# Patient Record
Sex: Male | Born: 1955 | Race: White | Hispanic: No | Marital: Married | Smoking: Never smoker
Health system: Southern US, Community
[De-identification: ages and names within clinical notes are randomized; demographics above are authoritative.]

## PROBLEM LIST (undated history)

## (undated) DIAGNOSIS — F32A Depression, unspecified: Secondary | ICD-10-CM

## (undated) DIAGNOSIS — D649 Anemia, unspecified: Secondary | ICD-10-CM

## (undated) DIAGNOSIS — F419 Anxiety disorder, unspecified: Secondary | ICD-10-CM

## (undated) DIAGNOSIS — G709 Myoneural disorder, unspecified: Secondary | ICD-10-CM

## (undated) DIAGNOSIS — R251 Tremor, unspecified: Secondary | ICD-10-CM

## (undated) DIAGNOSIS — G20A1 Parkinson's disease without dyskinesia, without mention of fluctuations: Secondary | ICD-10-CM

## (undated) DIAGNOSIS — I10 Essential (primary) hypertension: Secondary | ICD-10-CM

## (undated) DIAGNOSIS — Z860100 Personal history of colon polyps, unspecified: Secondary | ICD-10-CM

## (undated) DIAGNOSIS — R002 Palpitations: Secondary | ICD-10-CM

## (undated) DIAGNOSIS — R058 Other specified cough: Secondary | ICD-10-CM

## (undated) DIAGNOSIS — R42 Dizziness and giddiness: Secondary | ICD-10-CM

## (undated) DIAGNOSIS — G20 Parkinson's disease: Secondary | ICD-10-CM

## (undated) DIAGNOSIS — I739 Peripheral vascular disease, unspecified: Secondary | ICD-10-CM

## (undated) DIAGNOSIS — F329 Major depressive disorder, single episode, unspecified: Secondary | ICD-10-CM

## (undated) DIAGNOSIS — G20B1 Parkinson's disease with dyskinesia, without mention of fluctuations: Secondary | ICD-10-CM

## (undated) DIAGNOSIS — K6389 Other specified diseases of intestine: Secondary | ICD-10-CM

## (undated) DIAGNOSIS — I2510 Atherosclerotic heart disease of native coronary artery without angina pectoris: Secondary | ICD-10-CM

## (undated) DIAGNOSIS — I4891 Unspecified atrial fibrillation: Secondary | ICD-10-CM

## (undated) DIAGNOSIS — R945 Abnormal results of liver function studies: Secondary | ICD-10-CM

## (undated) DIAGNOSIS — E785 Hyperlipidemia, unspecified: Secondary | ICD-10-CM

## (undated) DIAGNOSIS — E7800 Pure hypercholesterolemia, unspecified: Secondary | ICD-10-CM

## (undated) DIAGNOSIS — R05 Cough: Secondary | ICD-10-CM

## (undated) HISTORY — PX: OTHER SURGICAL HISTORY: SHX169

## (undated) HISTORY — PX: COLONOSCOPY: SHX174

## (undated) HISTORY — PX: TONSILLECTOMY: SUR1361

## (undated) HISTORY — PX: BACK SURGERY: SHX140

## (undated) HISTORY — PX: HERNIA REPAIR: SHX51

---

## 2006-05-19 ENCOUNTER — Ambulatory Visit: Payer: Self-pay | Admitting: Gastroenterology

## 2008-11-27 ENCOUNTER — Emergency Department: Payer: Self-pay | Admitting: Emergency Medicine

## 2009-09-09 ENCOUNTER — Ambulatory Visit: Payer: Self-pay | Admitting: Gastroenterology

## 2013-03-08 ENCOUNTER — Ambulatory Visit: Payer: Self-pay | Admitting: Neurology

## 2013-03-25 DIAGNOSIS — D369 Benign neoplasm, unspecified site: Secondary | ICD-10-CM | POA: Insufficient documentation

## 2013-03-25 DIAGNOSIS — E7800 Pure hypercholesterolemia, unspecified: Secondary | ICD-10-CM | POA: Insufficient documentation

## 2013-03-25 DIAGNOSIS — R945 Abnormal results of liver function studies: Secondary | ICD-10-CM | POA: Insufficient documentation

## 2013-03-25 DIAGNOSIS — J039 Acute tonsillitis, unspecified: Secondary | ICD-10-CM

## 2013-03-25 DIAGNOSIS — E119 Type 2 diabetes mellitus without complications: Secondary | ICD-10-CM | POA: Insufficient documentation

## 2013-03-25 DIAGNOSIS — R002 Palpitations: Secondary | ICD-10-CM | POA: Insufficient documentation

## 2013-03-25 DIAGNOSIS — I10 Essential (primary) hypertension: Secondary | ICD-10-CM | POA: Insufficient documentation

## 2013-03-25 DIAGNOSIS — R42 Dizziness and giddiness: Secondary | ICD-10-CM | POA: Insufficient documentation

## 2013-03-25 HISTORY — DX: Acute tonsillitis, unspecified: J03.90

## 2013-04-19 ENCOUNTER — Emergency Department (HOSPITAL_COMMUNITY)
Admission: EM | Admit: 2013-04-19 | Discharge: 2013-04-19 | Disposition: A | Discharge status: Home / Self Care | Attending: Emergency Medicine | Admitting: Emergency Medicine

## 2013-04-19 ENCOUNTER — Encounter (HOSPITAL_COMMUNITY): Payer: Self-pay | Admitting: Emergency Medicine

## 2013-04-19 ENCOUNTER — Emergency Department (HOSPITAL_COMMUNITY)

## 2013-04-19 DIAGNOSIS — S81009A Unspecified open wound, unspecified knee, initial encounter: Secondary | ICD-10-CM | POA: Insufficient documentation

## 2013-04-19 DIAGNOSIS — Y9389 Activity, other specified: Secondary | ICD-10-CM | POA: Insufficient documentation

## 2013-04-19 DIAGNOSIS — E785 Hyperlipidemia, unspecified: Secondary | ICD-10-CM | POA: Insufficient documentation

## 2013-04-19 DIAGNOSIS — I10 Essential (primary) hypertension: Secondary | ICD-10-CM | POA: Insufficient documentation

## 2013-04-19 DIAGNOSIS — F17200 Nicotine dependence, unspecified, uncomplicated: Secondary | ICD-10-CM | POA: Insufficient documentation

## 2013-04-19 DIAGNOSIS — IMO0002 Reserved for concepts with insufficient information to code with codable children: Secondary | ICD-10-CM

## 2013-04-19 DIAGNOSIS — Z23 Encounter for immunization: Secondary | ICD-10-CM | POA: Insufficient documentation

## 2013-04-19 DIAGNOSIS — Y92009 Unspecified place in unspecified non-institutional (private) residence as the place of occurrence of the external cause: Secondary | ICD-10-CM | POA: Insufficient documentation

## 2013-04-19 DIAGNOSIS — S81809A Unspecified open wound, unspecified lower leg, initial encounter: Principal | ICD-10-CM

## 2013-04-19 DIAGNOSIS — Z8669 Personal history of other diseases of the nervous system and sense organs: Secondary | ICD-10-CM | POA: Insufficient documentation

## 2013-04-19 DIAGNOSIS — W540XXA Bitten by dog, initial encounter: Secondary | ICD-10-CM | POA: Insufficient documentation

## 2013-04-19 DIAGNOSIS — S91009A Unspecified open wound, unspecified ankle, initial encounter: Principal | ICD-10-CM

## 2013-04-19 HISTORY — DX: Parkinson's disease without dyskinesia, without mention of fluctuations: G20.A1

## 2013-04-19 HISTORY — DX: Essential (primary) hypertension: I10

## 2013-04-19 HISTORY — DX: Parkinson's disease: G20

## 2013-04-19 HISTORY — DX: Hyperlipidemia, unspecified: E78.5

## 2013-04-19 MED ORDER — HYDROCODONE-ACETAMINOPHEN 5-325 MG PO TABS: 1.0000 | ORAL_TABLET | Freq: Four times a day (QID) | ORAL | Status: DC | PRN

## 2013-04-19 MED ORDER — BACITRACIN 500 UNIT/GM EX OINT
1.0000 "application " | TOPICAL_OINTMENT | Freq: Two times a day (BID) | CUTANEOUS | Status: DC
  Administered 2013-04-19: 1 via TOPICAL
  Filled 2013-04-19: qty 0.9

## 2013-04-19 MED ORDER — AMOXICILLIN-POT CLAVULANATE 875-125 MG PO TABS: 1.0000 | ORAL_TABLET | Freq: Two times a day (BID) | ORAL | Status: DC

## 2013-04-19 MED ORDER — TETANUS-DIPHTH-ACELL PERTUSSIS 5-2.5-18.5 LF-MCG/0.5 IM SUSP
0.5000 mL | Freq: Once | INTRAMUSCULAR | Status: AC
  Administered 2013-04-19: 0.5 mL via INTRAMUSCULAR
  Filled 2013-04-19: qty 0.5

## 2013-04-19 NOTE — ED Provider Notes (Signed)
Medical screening examination/treatment/procedure(s) were performed by non-physician practitioner and as supervising physician I was immediately available for consultation/collaboration.   EKG Interpretation None        Mervin Kung, MD 04/19/13 [phone]

## 2013-04-19 NOTE — ED Notes (Signed)
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 Pt has large laceration to right leg and small abrasion to left shin. Bleeding is controlled at this time with dressing applied by ems. Pt unsure of last tetanus, has been NPO since 7am this morning. Pt is AAOX4, in NAD, pain 2/10

## 2013-04-19 NOTE — ED Provider Notes (Signed)
CSN: [id number]     Arrival date & time 04/19/13  1453 History   First MD Initiated Contact with Patient 04/19/13 1502     Chief Complaint  Patient presents with  . Animal Bite     (Consider location/radiation/quality/duration/timing/severity/associated sxs/prior Treatment) HPI Comments: Patient presents to the ED with a chief complaint of dog bite.  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  He denies being in any pain at this time, but states that he is still on an "adrenaline high."  He does report subjective numbness to the right lower extremity, but attributes this to his Parkinson's disease.  He is uncertain of his last tetanus shot.  There are no aggravating or alleviating factors.  He has not taken anything for his symptoms.  Bleeding is controlled.  Reportedly, the dog is up to date on his vaccinations.  The history is provided by the patient. No language interpreter was used.    Past Medical History  Diagnosis Date  . Hypertension   . Parkinson's disease   . Hyperlipemia    History reviewed. No pertinent past surgical history. No family history on file. History  Substance Use Topics  . Smoking status: Current Some Day Smoker  . Smokeless tobacco: Not on file  . Alcohol Use: No    Review of Systems  Constitutional: Negative for fever and chills.  Respiratory: Negative for shortness of breath.   Cardiovascular: Negative for chest pain.  Gastrointestinal: Negative for nausea, vomiting, diarrhea and constipation.  Genitourinary: Negative for dysuria.  Skin: Positive for wound.  Neurological: Positive for numbness.      Allergies  Review of patient's allergies indicates no known allergies.  Home Medications   Prior to Admission medications   Not on File   BP 153/74  Pulse 87  Temp(Src) 99.6 F (37.6 C) (Oral)  SpO2 98% Physical Exam  Nursing note and vitals reviewed. Constitutional:  He is oriented to person, place, and time. He appears well-developed and well-nourished.  HENT:  Head: Normocephalic and atraumatic.  Eyes: Conjunctivae and EOM are normal. Pupils are equal, round, and reactive to light. Right eye exhibits no discharge. Left eye exhibits no discharge. No scleral icterus.  Neck: Normal range of motion. Neck supple. No JVD present.  Cardiovascular: Normal rate, regular rhythm, normal heart sounds and intact distal pulses.  Exam reveals no gallop and no friction rub.   No murmur heard. Intact distal pulses, brisk cap refill  Pulmonary/Chest: Effort normal and breath sounds normal. No respiratory distress. He has no wheezes. He has no rales. He exhibits no tenderness.  Abdominal: Soft. He exhibits no distension and no mass. There is no tenderness. There is no rebound and no guarding.  Musculoskeletal: Normal range of motion. He exhibits no edema and no tenderness.  ROM and strength of right lower extremity are 5/5  Neurological: He is alert and oriented to person, place, and time.  Sensation and strength intact  Skin: Skin is warm and dry.  5 cm semi circular laceration to the right medial calf, no observed foreign bodies, no obvious tendon or muscle damage, the laceration appears to be through the skin only, bleeding is controlled  Psychiatric: He has a normal mood and affect. His behavior is normal. Judgment and thought content normal.    ED Course  Procedures (including critical care time) Labs Review Labs Reviewed - No data to display  Imaging Review  No results found. LACERATION REPAIR Performed by: Montine Circle Authorized by: Montine Circle Consent: Verbal consent obtained. Risks and benefits: risks, benefits and alternatives were discussed Consent given by: patient Patient identity confirmed: provided demographic data Prepped and Draped in normal sterile fashion Wound explored  Laceration Location: Right lower extremity, medial  calf  Laceration Length: 6 cm  No Foreign Bodies seen or palpated  Anesthesia: local infiltration  Local anesthetic: lidocaine 2% with epinephrine  Anesthetic total: 10 ml  Irrigation method: syringe Amount of cleaning: standard  Skin closure: staples  Number of sutures: 5  Technique: staples  Patient tolerance: Patient tolerated the procedure well with no immediate complications.   EKG Interpretation None      MDM   Final diagnoses:  Dog bite  Laceration    Patient with dog bite.  No tendon or muscle involvement.  Sensation and strength is intact.  Tetanus is updated.  Will order plain films.  Will reassess.  3:45 PM Patient discussed with Dr. Rogene Houston, who recommends loose closure with staples.  5:15 PM Lac irrigated copiously with 2 L of sterile water, repaired with loose closure with 5 staples.  Return precautions given.  tdap updated.  Patient is stable and ready for discharge.  DC home with some pain meds and augmentin.  PCP follow-up next week for wound recheck.  Staples out in 14 days.      Montine Circle, PA-C 04/19/13 1735

## 2013-04-19 NOTE — Discharge Instructions (Signed)

## 2013-05-10 DIAGNOSIS — W540XXA Bitten by dog, initial encounter: Secondary | ICD-10-CM | POA: Insufficient documentation

## 2013-05-10 DIAGNOSIS — S81859A Open bite, unspecified lower leg, initial encounter: Secondary | ICD-10-CM | POA: Insufficient documentation

## 2013-05-12 DIAGNOSIS — R1110 Vomiting, unspecified: Secondary | ICD-10-CM | POA: Insufficient documentation

## 2013-05-12 DIAGNOSIS — R1310 Dysphagia, unspecified: Secondary | ICD-10-CM | POA: Insufficient documentation

## 2013-05-12 DIAGNOSIS — K228 Other specified diseases of esophagus: Secondary | ICD-10-CM | POA: Insufficient documentation

## 2013-05-12 DIAGNOSIS — K2289 Other specified disease of esophagus: Secondary | ICD-10-CM | POA: Insufficient documentation

## 2013-05-12 DIAGNOSIS — K219 Gastro-esophageal reflux disease without esophagitis: Secondary | ICD-10-CM | POA: Insufficient documentation

## 2013-05-17 ENCOUNTER — Encounter: Payer: Self-pay | Admitting: Surgery

## 2013-05-30 ENCOUNTER — Encounter: Payer: Self-pay | Admitting: Surgery

## 2013-06-03 ENCOUNTER — Encounter: Payer: Self-pay | Admitting: Surgery

## 2013-07-03 ENCOUNTER — Encounter: Payer: Self-pay | Admitting: Surgery

## 2013-07-11 ENCOUNTER — Encounter: Payer: Self-pay | Admitting: General Surgery

## 2013-08-07 DIAGNOSIS — G20 Parkinson's disease: Secondary | ICD-10-CM | POA: Insufficient documentation

## 2013-12-06 DIAGNOSIS — F411 Generalized anxiety disorder: Secondary | ICD-10-CM | POA: Insufficient documentation

## 2014-02-03 ENCOUNTER — Encounter: Payer: Self-pay | Admitting: Neurology

## 2014-03-04 ENCOUNTER — Encounter: Payer: Self-pay | Admitting: Neurology

## 2014-08-18 ENCOUNTER — Encounter: Payer: Self-pay | Admitting: Occupational Therapy

## 2014-08-18 ENCOUNTER — Ambulatory Visit: Payer: Federal, State, Local not specified - PPO | Attending: Neurology | Admitting: Occupational Therapy

## 2014-08-18 DIAGNOSIS — R460 Very low level of personal hygiene: Secondary | ICD-10-CM | POA: Diagnosis present

## 2014-08-18 DIAGNOSIS — R279 Unspecified lack of coordination: Secondary | ICD-10-CM | POA: Insufficient documentation

## 2014-08-18 DIAGNOSIS — Z741 Need for assistance with personal care: Secondary | ICD-10-CM

## 2014-08-18 DIAGNOSIS — M6281 Muscle weakness (generalized): Secondary | ICD-10-CM | POA: Diagnosis not present

## 2014-08-19 NOTE — Therapy (Signed)
[place] MAIN [facility] SERVICES [address] [place], [place], [address] Phone: [phone]   Fax:  [phone]  Occupational Therapy Evaluation  Patient Details  Name: Charles Stanton MRN: [id number] Date of Birth: Dec 24, 1955 Referring Provider:  Vladimir Crofts, MD  Encounter Date: 08/18/2014      OT End of Session - 08/19/14 1538    Visit Number 1   Number of Visits 16   OT Start Time 1300   OT Stop Time 1400   OT Time Calculation (min) 60 min   Activity Tolerance Patient tolerated treatment well   Behavior During Therapy [facility] for tasks assessed/performed      Past Medical History  Diagnosis Date  . Hypertension   . Parkinson's disease   . Hyperlipemia     History reviewed. No pertinent past surgical history.  There were no vitals filed for this visit.  Visit Diagnosis:  Muscle weakness - Plan: Ot plan of care cert/re-cert  Lack of coordination - Plan: Ot plan of care cert/re-cert  Self-care deficit for dressing and grooming - Plan: Ot plan of care cert/re-cert  Self-care deficit for bathing and hygiene - Plan: Ot plan of care cert/re-cert      Subjective Assessment - 08/18/14 1317    Subjective  Patient reports he went for a check up with the neurologist, told him about having trouble with weakness in the right hand, picking up items, cutting food.  "It's really frustrating."  Patient reports he recently retired and wants to be able to do more around the house.     Patient Stated Goals "My goal is to be able to cut a steak".  Patient wants to be able to do more with his right hand, especially with handwriting, cutting food and picking up things."   Currently in Pain? No/denies   Multiple Pain Sites No           [facility] OT Assessment - 08/18/14 1322    Assessment   Diagnosis Parkinson's disease, right hand weakness   Onset Date 05/26/14   Prior Therapy LSVT BIG about 1 year ago.     Balance Screen   Has the patient  fallen in the past 6 months Yes   How many times? 4   Has the patient had a decrease in activity level because of a fear of falling?  Yes   Is the patient reluctant to leave their home because of a fear of falling?  Yes   Home  Environment   Family/patient expects to be discharged to: Private residence   Living Arrangements Spouse/significant other   Type of [place] - Number of Steps 10   Bathroom Shower/Tub Tub/Shower unit;Curtain   Biochemist, clinical Yes   Home Equipment None   Lives With Spouse   Prior Function   Level of Independence Independent   Vocation Retired   ADL   Eating/Feeding Needs assist with cutting food   Grooming Minimal assistance   Upper Body Bathing Other (comment)  slow to complete   Lower Body Bathing Increased time  decreased thoroughness    Upper Body Dressing Needs assist for fasteners;Increased time;Minimal assistance   Lower Body Dressing Increased time;Needs assist for fasteners;Minimal assistance   Toilet Tranfer Modified independent   Toileting - Clothing Manipulation Increased time   Toileting -  Hygiene Increase time;Minimal assistance   Tub/Shower  Transfer Supervision/safety   Transfers/Ambulation Related to ADL's ambulates without an assistive device.   IADL   Prior Level of Function Shopping independent    Shopping Shops independently for small purchases   Prior Level of Function Light Housekeeping independent   Light Housekeeping Performs light daily tasks but cannot maintain acceptable level of cleanliness;Needs help with all home maintenance tasks   Prior Level of Function Meal Prep independent;   Meal Prep Able to complete simple warm meal prep   Prior Level of Function Community Mobility independent, needs buggy to hold onto when shopping.   Community Mobility Drives own vehicle  short distances less than 30 minutes   Medication  Management Has difficulty remembering to take medication   Prior Level of Function Financial Management independent   Financial Management Manages day-to-day purchases, but needs help with banking, major purchases, etc.   Mobility   Mobility Status Independent;History of falls   Written Expression   Dominant Hand Right   Handwriting Mild micrographia;50% legible   Sensation   Light Touch Appears Intact   Stereognosis Appears Intact   Hot/Cold Appears Intact   Proprioception Appears Intact   Additional Comments Patient reports numbness in right hand.     Coordination   Gross Motor Movements are Fluid and Coordinated No   Fine Motor Movements are Fluid and Coordinated No   Finger Nose Finger Test impaired on right    9 Hole Peg Test Right;Left   Right 9 Hole Peg Test 43 secs   Left 9 Hole Peg Test 29 secs   Tremors right foot, occasionally right hand but also on left side of face   Edema - Water Displacement (mL)   Right hand --  mild edema noted   ROM / Strength   AROM / PROM / Strength AROM;Strength   AROM   Overall AROM  Deficits   Overall AROM Comments right shoulder flexion limited to 125 degrees, decrease in ER, wrist extension to 40 degrees.    Strength   Overall Strength Deficits   Overall Strength Comments RUE 4/5 overall, left UE 5/5   Hand Function   Right Hand Grip (lbs) 28   Right Hand Lateral Pinch 12 lbs   Right Hand 3 Point Pinch 13 lbs   Left Hand Grip (lbs) 58   Left Hand Lateral Pinch 16 lbs   Left 3 point pinch 15 lbs   Comment 2 point pinch right 8, left 11 #                         OT Education - 08/18/14 1537    Education provided Yes   Education Details Edema control, functional use of right hand, role of OT             OT Long Term Goals - 08/18/14 1540    OT LONG TERM GOAL #1   Title Patient will improve R hand grip by 10# to open jars, containers and water bottles.    Baseline has moderate difficulty with opening  containers.   Time 8   Period Weeks   Status New   OT LONG TERM GOAL #2   Title Patient will improve grip sufficient to cut food with a knife with modified independence   Baseline unable, family has to assist   Time 8   Period Weeks   Status New   OT LONG TERM GOAL #3   Title Edema in patients right hand will decrease .  5 cm at the wrist and around MPs to demonstrate full ROM for full functional grasp and release of objects.   Baseline increased edema in right hand and wrist at evaluation, by 1cm in wrist and MPs   Time 8   Period Weeks   Status New   OT LONG TERM GOAL #4   Title Patient will improve coordination in right hand to produce a secure tripod grasp on pen and complete handwriting with 90% legibility.    Baseline micrographia and difficulty holding writing utensil.   Time 8   Period Weeks   Status New   OT LONG TERM GOAL #5   Title Patient will improve grasp on right by 2# of force to open a bag of chips.   Baseline unable at evaluation   Time 8   Period Weeks   Status New   Long Term Additional Goals   Additional Long Term Goals Yes   OT LONG TERM GOAL #6   Title Patient will complete basic self care with modified independence.     Baseline minimal assist and increased time required at evaluation   Time 8   Period Weeks   Status New   OT LONG TERM GOAL #7   Title Patient will complete light meal prep with modified independence.   Baseline unable to perform cooking tasks at evaluation.   Time 8   Period Weeks   Status New               Plan - 08/19/14 1538    Clinical Impression Statement Patient is a 59 yo male who was diagnosed with Parkinson's disease and has completed LSVT BIG/LOUD therapies.  He complains of decreased right hand use and presents with mild edema in the hand, decreased strength, ROM and functional use of the hand.  He demonstrates difficulty with holding items in his right hand, cutting food, completing meal preparation, completing  basic self care tasks and handwriting.  He would benefit from skilled OT to improve his right hand use to complete necessary daily tasks with modified independence.     Pt will benefit from skilled therapeutic intervention in order to improve on the following deficits (Retired) Decreased endurance;Decreased strength;Impaired flexibility;Decreased balance;Difficulty walking;Decreased range of motion;Increased edema;Pain;Decreased coordination;Impaired UE functional use   OT Frequency 2x / week   OT Duration 8 weeks   OT Treatment/Interventions Self-care/ADL training;Therapeutic exercise;Patient/family education;Neuromuscular education;Manual Therapy;DME and/or AE instruction;Passive range of motion;Contrast Bath;Moist Heat   OT Home Exercise Plan Will plan to issue HEP for RUE strength and coordination tasks.   Consulted and Agree with Plan of Care Patient        Problem List There are no active problems to display for this patient.  Achilles Dunk, OTR/L, CLT Charles Stanton 08/19/2014, 3:56 PM  [place] MAIN [facility] SERVICES [address] [place], [place], [address] Phone: [phone]   Fax:  [phone]

## 2014-09-01 ENCOUNTER — Ambulatory Visit: Payer: Federal, State, Local not specified - PPO | Admitting: Occupational Therapy

## 2014-09-01 DIAGNOSIS — R279 Unspecified lack of coordination: Secondary | ICD-10-CM

## 2014-09-01 DIAGNOSIS — R460 Very low level of personal hygiene: Secondary | ICD-10-CM

## 2014-09-01 DIAGNOSIS — M6281 Muscle weakness (generalized): Secondary | ICD-10-CM

## 2014-09-01 DIAGNOSIS — Z741 Need for assistance with personal care: Secondary | ICD-10-CM

## 2014-09-01 NOTE — Patient Instructions (Addendum)
Palmar Pinch Strengthening (Resistive Putty)   Pinch putty between thumb and each fingertip in turn. Repeat ____ times. Do ____ sessions per day.  Copyright  VHI. All rights reserved.  Lateral Pinch Strengthening (Resistive Putty)   Squeeze between thumb and side of each finger in turn. Repeat ____ times. Do ____ sessions per day.  Copyright  VHI. All rights reserved.  Grip Strengthening (Resistive Putty)   Squeeze putty using thumb and all fingers. Repeat ____ times. Do ____ sessions per day.  Copyright  VHI. All rights reserved.  Three Jaw Chuck Pinch Strengthening (Resistive Putty)   Pull putty, using thumb, index and middle fingers. Repeat ____ times. Do ____ sessions per day.  Copyright  VHI. All rights reserved.

## 2014-09-02 NOTE — Therapy (Signed)
[place] MAIN [facility] SERVICES [address] [place], [place], [address] Phone: [phone]   Fax:  [phone]  Occupational Therapy Treatment  Patient Details  Name: Charles Stanton MRN: [id number] Date of Birth: [birth year] Referring Provider:  Vladimir Crofts, MD  Encounter Date: 09/01/2014      OT End of Session - 09/01/14 1623    Visit Number 2   Number of Visits 16   OT Start Time 1304   OT Stop Time 1401   OT Time Calculation (min) 57 min   Activity Tolerance Patient tolerated treatment well   Behavior During Therapy [facility] for tasks assessed/performed      Past Medical History  Diagnosis Date  . Hypertension   . Parkinson's disease   . Hyperlipemia     No past surgical history on file.  There were no vitals filed for this visit.  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  "I take them to putt putt and movies, they love it."   Patient Stated Goals "My goal is to be able to cut a steak".  Patient wants to be able to do more with his right hand, especially with handwriting, cutting food and picking up things."   Currently in Pain? No/denies   Multiple Pain Sites No                      OT Treatments/Exercises (OP) - 09/01/14 1622    Fine Motor Coordination   Other Fine Motor Exercises Patient seen for fine motor coordination tasks with right hand, manipulation of 1/2 inch objects, manipulation of [place] like discs with cues for isolated finger movements.    Neurological Re-education Exercises   Other Exercises 1 Patient seen for red theraband exercises for shoulder ABD/ADD, elbow flexion/ext, diagonal patterns for 10 reps for 2 sets.  Grip  strength on 2nd setting of 11# for 25 reps with right hand for sustained grip, resistive pinch skills with yellow, red, green pinch pins to elevated surface with cues for lateral and 3 point pinch.   Other Exercises 2 Red theraputty exercises for HEP.                OT Education - 09/01/14 1623    Education provided Yes   Education Details HEP with  theraputty   Person(s) Educated Patient   Methods Explanation;Demonstration;Verbal cues   Comprehension Verbalized understanding;Returned demonstration;Verbal cues required             OT Long Term Goals - 08/18/14 1540    OT LONG TERM GOAL #1   Title Patient will improve R hand grip by 10# to open jars, containers and water bottles.    Baseline has moderate difficulty with opening containers.   Time 8   Period Weeks   Status New   OT LONG TERM GOAL #2   Title Patient will improve grip sufficient to cut food with a knife with modified independence   Baseline unable, family has to assist   Time 8   Period Weeks   Status New   OT LONG TERM GOAL #3   Title Edema in patients right hand will decrease .5 cm  at the wrist and around MPs to demonstrate full ROM for full functional grasp and release of objects.   Baseline increased edema in right hand and wrist at evaluation, by 1cm in wrist and MPs   Time 8   Period Weeks   Status New   OT LONG TERM GOAL #4   Title Patient will improve coordination in right hand to produce a secure tripod grasp on pen and complete handwriting with 90% legibility.    Baseline micrographia and difficulty holding writing utensil.   Time 8   Period Weeks   Status New   OT LONG TERM GOAL #5   Title Patient will improve grasp on right by 2# of force to open a bag of chips.   Baseline unable at evaluation   Time 8   Period Weeks   Status New   Long Term Additional Goals   Additional Long Term Goals Yes   OT LONG TERM GOAL #6   Title Patient will complete basic self care with modified  independence.     Baseline minimal assist and increased time required at evaluation   Time 8   Period Weeks   Status New   OT LONG TERM GOAL #7   Title Patient will complete light meal prep with modified independence.   Baseline unable to perform cooking tasks at evaluation.   Time 8   Period Weeks   Status New               Plan - 09/01/14 1624    Clinical Impression Statement Patient continues to demo decreased strength and coordination in his right hand which affects his ability to perform daily tasks.  Instructed on HEP for grip and pinch to work on at home.  Impaired right hand coordination for manipulation of smaller objects.  Billey Gosling continue to work towards these skills to improve independence in self care and IADL tasks.   Pt will benefit from skilled therapeutic intervention in order to improve on the following deficits (Retired) Decreased endurance;Decreased strength;Impaired flexibility;Decreased balance;Difficulty walking;Decreased range of motion;Increased edema;Pain;Decreased coordination;Impaired UE functional use   OT Frequency 2x / week   OT Duration 8 weeks   OT Treatment/Interventions Self-care/ADL training;Therapeutic exercise;Patient/family education;Neuromuscular education;Manual Therapy;DME and/or AE instruction;Passive range of motion;Contrast Bath;Moist Heat   OT Home Exercise Plan Will plan to issue HEP for RUE strength and coordination tasks.   Consulted and Agree with Plan of Care Patient        Problem List There are no active problems to display for this patient.  Dakari Cregger Oneita Jolly, OTR/L, CLT Derius Ghosh 09/02/2014, 9:35 PM  [place] MAIN [facility] SERVICES [address] [place], [place], [address] Phone: [phone]   Fax:  [phone]

## 2014-09-04 ENCOUNTER — Ambulatory Visit: Payer: Federal, State, Local not specified - PPO | Attending: Neurology | Admitting: Occupational Therapy

## 2014-09-04 DIAGNOSIS — M6281 Muscle weakness (generalized): Secondary | ICD-10-CM | POA: Diagnosis not present

## 2014-09-04 DIAGNOSIS — R279 Unspecified lack of coordination: Secondary | ICD-10-CM

## 2014-09-04 DIAGNOSIS — Z741 Need for assistance with personal care: Secondary | ICD-10-CM

## 2014-09-04 DIAGNOSIS — R460 Very low level of personal hygiene: Secondary | ICD-10-CM | POA: Insufficient documentation

## 2014-09-05 NOTE — Therapy (Signed)
[place] MAIN [facility] SERVICES [address] [place], [place], [address] Phone: [phone]   Fax:  [phone]  Occupational Therapy Treatment  Patient Details  Name: Charles Stanton MRN: [id number] Date of Birth: 05-17-1955 Referring Provider:  Vladimir Crofts, MD  Encounter Date: 09/04/2014      OT End of Session - 09/04/14 2220    Visit Number 3   Number of Visits 16   OT Start Time 0125   OT Stop Time 0215   OT Time Calculation (min) 50 min   Activity Tolerance Patient tolerated treatment well   Behavior During Therapy [name] for tasks assessed/performed      Past Medical History  Diagnosis Date  . Hypertension   . Parkinson's disease   . Hyperlipemia     No past surgical history on file.  There were no vitals filed for this visit.  Visit Diagnosis:  Muscle weakness  Lack of coordination  Self-care deficit for bathing and hygiene  Self-care deficit for dressing and grooming      Subjective Assessment - 09/04/14 2218    Patient Stated Goals "My goal is to be able to cut a steak".  Patient wants to be able to do more with his right hand, especially with handwriting, cutting food and picking up things."   Currently in Pain? No/denies   Multiple Pain Sites No                      OT Treatments/Exercises (OP) - 09/04/14 2219    Fine Motor Coordination   Other Fine Motor Exercises Patient seen for manipulation/coordination to turn items 2inch tall, cylindrical objets, manipulation of knotting and unknotting with using right hand as the lead hand, manipulation of 2 inch skinny sticks to pick up from table and to turn with isolated finger movements and place into vertical position in board.  Tennis ball drills with right hand with toss/catch, bounce/catch and hand to hand toss with cues and therapist assist.     Neurological Re-education Exercises   Other Exercises 1 Grip strengthening tasks with setting at 11# for 25  reps with right hand, cues for technique.  Resistive pinch skills for 3 point and lateral pinch skills.  Resistive ROM tasks along with active reaching.                OT Education - 09/04/14 2219    Education provided Yes   Education Details HEP, coordination tasks.   Person(s) Educated Patient   Methods Explanation;Demonstration;Verbal cues   Comprehension Verbal cues required;Returned demonstration;Verbalized understanding             OT Long Term Goals - 08/18/14 1540    OT LONG TERM GOAL #1   Title Patient will improve R hand grip by 10# to open jars, containers and water bottles.    Baseline has moderate difficulty with opening containers.   Time 8   Period Weeks   Status New   OT LONG TERM GOAL #2   Title Patient will improve grip sufficient to cut food with a knife with modified independence   Baseline unable, family has to assist   Time 8   Period Weeks   Status New   OT LONG TERM GOAL #3   Title Edema in patients right hand will decrease .5 cm at the wrist and around MPs to demonstrate full ROM for full functional grasp and release of objects.   Baseline increased edema in  right hand and wrist at evaluation, by 1cm in wrist and MPs   Time 8   Period Weeks   Status New   OT LONG TERM GOAL #4   Title Patient will improve coordination in right hand to produce a secure tripod grasp on pen and complete handwriting with 90% legibility.    Baseline micrographia and difficulty holding writing utensil.   Time 8   Period Weeks   Status New   OT LONG TERM GOAL #5   Title Patient will improve grasp on right by 2# of force to open a bag of chips.   Baseline unable at evaluation   Time 8   Period Weeks   Status New   Long Term Additional Goals   Additional Long Term Goals Yes   OT LONG TERM GOAL #6   Title Patient will complete basic self care with modified independence.     Baseline minimal assist and increased time required at evaluation   Time 8   Period  Weeks   Status New   OT LONG TERM GOAL #7   Title Patient will complete light meal prep with modified independence.   Baseline unable to perform cooking tasks at evaluation.   Time 8   Period Weeks   Status New               Plan - 09/04/14 2221    Clinical Impression Statement Patient demonstrates impairments in R hand coordination tasks but responds well to repetition and multiple trials of activity.  Will continue to work towards increasing challenge of tasks as well as improving strength to improve independence in daily activities.  Cues for isolated finger movements and techniques for coordination tasks.     Pt will benefit from skilled therapeutic intervention in order to improve on the following deficits (Retired) Decreased endurance;Decreased strength;Impaired flexibility;Decreased balance;Difficulty walking;Decreased range of motion;Increased edema;Pain;Decreased coordination;Impaired UE functional use   OT Frequency 2x / week   OT Duration 8 weeks   OT Treatment/Interventions Self-care/ADL training;Therapeutic exercise;Patient/family education;Neuromuscular education;Manual Therapy;DME and/or AE instruction;Passive range of motion;Contrast Bath;Moist Heat   Consulted and Agree with Plan of Care Patient        Problem List There are no active problems to display for this patient.  Achilles Dunk, OTR/L, CLT  Lacie Landry 09/05/2014, 11:08 AM  [place] [address] [place], [place], [address] Phone: [phone]   Fax:  [phone]

## 2014-09-10 ENCOUNTER — Ambulatory Visit: Payer: Federal, State, Local not specified - PPO | Admitting: Occupational Therapy

## 2014-09-10 DIAGNOSIS — R279 Unspecified lack of coordination: Secondary | ICD-10-CM

## 2014-09-10 DIAGNOSIS — M6281 Muscle weakness (generalized): Secondary | ICD-10-CM | POA: Diagnosis not present

## 2014-09-10 DIAGNOSIS — R460 Very low level of personal hygiene: Secondary | ICD-10-CM

## 2014-09-10 DIAGNOSIS — Z741 Need for assistance with personal care: Secondary | ICD-10-CM

## 2014-09-11 NOTE — Therapy (Signed)
[place] MAIN [facility] SERVICES [address] [place], [place], [address] Phone: [phone]   Fax:  [phone]  Occupational Therapy Treatment  Patient Details  Name: Charles Stanton MRN: [id number] Date of Birth: 1955-05-10 Referring Provider:  Vladimir Crofts, MD  Encounter Date: 09/10/2014      OT End of Session - 09/10/14 1320    Visit Number 4   Number of Visits 16   OT Start Time 1300   OT Stop Time 9767   OT Time Calculation (min) 47 min   Activity Tolerance Patient tolerated treatment well   Behavior During Therapy [facility] for tasks assessed/performed      Past Medical History  Diagnosis Date  . Hypertension   . Parkinson's disease   . Hyperlipemia     No past surgical history on file.  There were no vitals filed for this visit.  Visit Diagnosis:  Muscle weakness  Lack of coordination  Self-care deficit for bathing and hygiene  Self-care deficit for dressing and grooming      Subjective Assessment - 09/10/14 1318    Subjective  Patient reports he did some hand exercises over the weekend, has been using his foot pedal bike on the table for both arms.  Been practicing squeezing the handle of the grill pan to put in on and off for strength in the hand.     Patient Stated Goals "My goal is to be able to cut a steak".  Patient wants to be able to do more with his right hand, especially with handwriting, cutting food and picking up things."   Currently in Pain? No/denies   Multiple Pain Sites No                      OT Treatments/Exercises (OP) - 09/10/14 1400    Fine Motor Coordination   Other Fine Motor Exercises Patient seen for manipulation/coordination with manipulation of knotting and unknotting with using right hand as the lead hand, manipulation of 1/2 inch washers and long dowels to place into vertical position in board.     Neurological Re-education Exercises   Other Exercises 1 3# dowel exercises in  sitting for shoulder flexion, ABD, ADD, forwards and backwards arm circles and chest press for 10 reps for 2 sets each.  Supination/pronation with 16 oz hammer with cues for strengthening of forearm, 15 reps.  Resistive reciprocal arm movements with alternating directions and levels of resistance from 2.5 to 3.0 for 5 minutes with therapist in constant attendance to adjust resistance, provide cues and ensure grip on right side.                 OT Education - 09/10/14 1319    Education provided Yes   Education Details HEP, dowel exercises   Person(s) Educated Patient   Methods Explanation;Demonstration;Verbal cues   Comprehension Verbal cues required;Returned demonstration;Verbalized understanding             OT Long Term Goals - 08/18/14 1540    OT LONG TERM GOAL #1   Title Patient will improve R hand grip by 10# to open jars, containers and water bottles.    Baseline has moderate difficulty with opening containers.   Time 8   Period Weeks   Status New   OT LONG TERM GOAL #2   Title Patient will improve grip sufficient to cut food with a knife with modified independence   Baseline unable, family has to assist  Time 8   Period Weeks   Status New   OT LONG TERM GOAL #3   Title Edema in patients right hand will decrease .5 cm at the wrist and around MPs to demonstrate full ROM for full functional grasp and release of objects.   Baseline increased edema in right hand and wrist at evaluation, by 1cm in wrist and MPs   Time 8   Period Weeks   Status New   OT LONG TERM GOAL #4   Title Patient will improve coordination in right hand to produce a secure tripod grasp on pen and complete handwriting with 90% legibility.    Baseline micrographia and difficulty holding writing utensil.   Time 8   Period Weeks   Status New   OT LONG TERM GOAL #5   Title Patient will improve grasp on right by 2# of force to open a bag of chips.   Baseline unable at evaluation   Time 8    Period Weeks   Status New   Long Term Additional Goals   Additional Long Term Goals Yes   OT LONG TERM GOAL #6   Title Patient will complete basic self care with modified independence.     Baseline minimal assist and increased time required at evaluation   Time 8   Period Weeks   Status New   OT LONG TERM GOAL #7   Title Patient will complete light meal prep with modified independence.   Baseline unable to perform cooking tasks at evaluation.   Time 8   Period Weeks   Status New               Plan - 09/10/14 1636    Clinical Impression Statement Patient demonstrating increased reaching this date with tasks, able to utilize 3# weight with dowel exercises completed with verbal cues and therapist demo for technique.  Knotting exercise for coordination improved this date with ability to complete all unknotting and increased speed of task than previous sessions.     Pt will benefit from skilled therapeutic intervention in order to improve on the following deficits (Retired) Decreased endurance;Decreased strength;Impaired flexibility;Decreased balance;Difficulty walking;Decreased range of motion;Increased edema;Pain;Decreased coordination;Impaired UE functional use   OT Frequency 2x / week   OT Duration 8 weeks   OT Treatment/Interventions Self-care/ADL training;Therapeutic exercise;Patient/family education;Neuromuscular education;Manual Therapy;DME and/or AE instruction;Passive range of motion;Contrast Bath;Moist Heat   OT Home Exercise Plan Will plan to issue HEP for RUE strength and coordination tasks.   Consulted and Agree with Plan of Care Patient        Problem List There are no active problems to display for this patient.  Achilles Dunk, OTR/L, CLT  Lovett,Amy 09/11/2014, 9:10 AM  [place] [address] [place], [place], [address] Phone: [phone]   Fax:  [phone]

## 2014-09-12 ENCOUNTER — Ambulatory Visit: Payer: Federal, State, Local not specified - PPO | Admitting: Occupational Therapy

## 2014-09-12 DIAGNOSIS — Z741 Need for assistance with personal care: Secondary | ICD-10-CM

## 2014-09-12 DIAGNOSIS — M6281 Muscle weakness (generalized): Secondary | ICD-10-CM | POA: Diagnosis not present

## 2014-09-12 DIAGNOSIS — R460 Very low level of personal hygiene: Secondary | ICD-10-CM

## 2014-09-12 DIAGNOSIS — R279 Unspecified lack of coordination: Secondary | ICD-10-CM

## 2014-09-13 NOTE — Therapy (Signed)
[place] MAIN [facility] SERVICES [address] [place], [place], [address] Phone: [phone]   Fax:  [phone]  Occupational Therapy Treatment  Patient Details  Name: Charles Stanton MRN: [id number] Date of Birth: 09/27/1955 Referring Provider:  Vladimir Crofts, MD  Encounter Date: 09/12/2014      OT End of Session - 09/12/14 1842    Visit Number 5   Number of Visits 16   OT Start Time 1000   OT Stop Time 1046   OT Time Calculation (min) 46 min   Activity Tolerance Patient tolerated treatment well   Behavior During Therapy [facility] for tasks assessed/performed      Past Medical History  Diagnosis Date  . Hypertension   . Parkinson's disease   . Hyperlipemia     No past surgical history on file.  There were no vitals filed for this visit.  Visit Diagnosis:  Muscle weakness  Lack of coordination  Self-care deficit for bathing and hygiene  Self-care deficit for dressing and grooming      Subjective Assessment - 09/12/14 1839    Subjective  Patient reports he fell last night, had gotten up to go get water and fell down the last few stairs.  Called out for his wife but she couldn't hear him and he finally was able to get up .  Wife is supposed to go out of town this weekend and patient is now concerned about staying alone.  Discussed options and patient has decided to stay the night with his daughter.   Patient Stated Goals "My goal is to be able to cut a steak".  Patient wants to be able to do more with his right hand, especially with handwriting, cutting food and picking up things."   Currently in Pain? No/denies   Multiple Pain Sites No                      OT Treatments/Exercises (OP) - 09/12/14 1848    Transfers   Comments focused on options for fall recovery, home safety, transfers from floor and use of cell phone.   Fine Motor Coordination   Other Fine Motor Exercises Patient seen for manipulation/coordination  with manipulation of knotting and unknotting with right hand.  Patient seen for in hand manipulation of 1/2 inch objects, translatory movements of the hand and using the hand for storage.  Cues provided by therapist.   Neurological Re-education Exercises   Other Exercises 1 Patient seen for resistive pinch skills at all levels of resistance combined with active reach with cues.  Patient requires occasional rest breaks.  Patient engaging in grip strength tasks with right hand with hand gripper on 2nd setting for 25 reps.  3# dumbell weights for shoulder flexion and chest press, elbow flexion/extension for 12 reps for 2 sets.                  OT Education - 09/12/14 1841    Education provided Yes   Education Details HEP   Person(s) Educated Patient   Methods Explanation;Demonstration;Verbal cues   Comprehension Verbal cues required;Returned demonstration;Verbalized understanding             OT Long Term Goals - 08/18/14 1540    OT LONG TERM GOAL #1   Title Patient will improve R hand grip by 10# to open jars, containers and water bottles.    Baseline has moderate difficulty with opening containers.   Time 8  Period Weeks   Status New   OT LONG TERM GOAL #2   Title Patient will improve grip sufficient to cut food with a knife with modified independence   Baseline unable, family has to assist   Time 8   Period Weeks   Status New   OT LONG TERM GOAL #3   Title Edema in patients right hand will decrease .5 cm at the wrist and around MPs to demonstrate full ROM for full functional grasp and release of objects.   Baseline increased edema in right hand and wrist at evaluation, by 1cm in wrist and MPs   Time 8   Period Weeks   Status New   OT LONG TERM GOAL #4   Title Patient will improve coordination in right hand to produce a secure tripod grasp on pen and complete handwriting with 90% legibility.    Baseline micrographia and difficulty holding writing utensil.   Time 8    Period Weeks   Status New   OT LONG TERM GOAL #5   Title Patient will improve grasp on right by 2# of force to open a bag of chips.   Baseline unable at evaluation   Time 8   Period Weeks   Status New   Long Term Additional Goals   Additional Long Term Goals Yes   OT LONG TERM GOAL #6   Title Patient will complete basic self care with modified independence.     Baseline minimal assist and increased time required at evaluation   Time 8   Period Weeks   Status New   OT LONG TERM GOAL #7   Title Patient will complete light meal prep with modified independence.   Baseline unable to perform cooking tasks at evaluation.   Time 8   Period Weeks   Status New               Plan - 09/12/14 1843    Clinical Impression Statement Patient denied any injuries from fall last night.  Educated on alternative ways to get off the floor after a fall.  Recommend patient carry his cell phone in his pocket when moving around the home.  Patient was emotionally labile at the beginning of the session however, by the end of the session he felt better with the decision to stay with his daughter for the night.  Continued focus on right hand strength, ROM and coordination to improve self care and IADL tasks.    Pt will benefit from skilled therapeutic intervention in order to improve on the following deficits (Retired) Decreased endurance;Decreased strength;Impaired flexibility;Decreased balance;Difficulty walking;Decreased range of motion;Increased edema;Pain;Decreased coordination;Impaired UE functional use   Rehab Potential Good   OT Frequency 2x / week   OT Duration 8 weeks   OT Treatment/Interventions Self-care/ADL training;Therapeutic exercise;Patient/family education;Neuromuscular education;Manual Therapy;DME and/or AE instruction;Passive range of motion;Contrast Bath;Moist Heat   Consulted and Agree with Plan of Care Patient        Problem List There are no active problems to display for this  patient.  Achilles Dunk, OTR/L, CLT  Aerika Groll 09/13/2014, 6:52 PM  [place] MAIN [facility] SERVICES [address] [place], [place], [address] Phone: [phone]   Fax:  [phone]

## 2014-09-17 ENCOUNTER — Ambulatory Visit: Payer: Federal, State, Local not specified - PPO | Admitting: Occupational Therapy

## 2014-09-17 DIAGNOSIS — Z741 Need for assistance with personal care: Secondary | ICD-10-CM

## 2014-09-17 DIAGNOSIS — M6281 Muscle weakness (generalized): Secondary | ICD-10-CM | POA: Diagnosis not present

## 2014-09-17 DIAGNOSIS — R460 Very low level of personal hygiene: Secondary | ICD-10-CM

## 2014-09-17 DIAGNOSIS — R279 Unspecified lack of coordination: Secondary | ICD-10-CM

## 2014-09-18 NOTE — Therapy (Signed)
[place] MAIN [facility] SERVICES [address] [place], [place], [address] Phone: [phone]   Fax:  [phone]  Occupational Therapy Treatment  Patient Details  Name: Charles Stanton MRN: [id number] Date of Birth: October 02, 1955 Referring Provider:  Vladimir Crofts, MD  Encounter Date: 09/17/2014      OT End of Session - 09/17/14 1539    Visit Number 6   Number of Visits 16   OT Start Time 1300   OT Stop Time 1348   OT Time Calculation (min) 48 min   Activity Tolerance Patient tolerated treatment well   Behavior During Therapy [facility] for tasks assessed/performed      Past Medical History  Diagnosis Date  . Hypertension   . Parkinson's disease   . Hyperlipemia     No past surgical history on file.  There were no vitals filed for this visit.  Visit Diagnosis:  Muscle weakness  Lack of coordination  Self-care deficit for bathing and hygiene  Self-care deficit for dressing and grooming      Subjective Assessment - 09/17/14 1536    Subjective  Patient reports he is doing better, was glad to have his wife back home after her trip.  He was able to stay alone for part of the time and also spent some time with his daughter.  Went to church with her on Sunday.  "i have been really working on exercises at home, I wanted to impress you today."   Patient is accompained by: Family member   Patient Stated Goals "My goal is to be able to cut a steak".  Patient wants to be able to do more with his right hand, especially with handwriting, cutting food and picking up things."   Currently in Pain? No/denies   Multiple Pain Sites No                      OT Treatments/Exercises (OP) - 09/18/14 0001    Fine Motor Coordination   Other Fine Motor Exercises Patient seen for in hand manipulation of 1/2 inch objects, translatory movements of the hand and using the hand for storage. Cues provided by therapist.Manipulation of coins from table top  and moving from fingers to palm and back to place into a bank with right hand.  Manipulation of Purdue pegboard to manipulate pieces to assemble components with focus on prehension patterns.   Neurological Re-education Exercises   Other Exercises 1 Patient seen for resistive reaching exercises with right UE all joints and planes.  Gross grasp and release of a variety of objects varying in shape and size.  ROM for finger flexion and extension.                 OT Education - 09/17/14 1539    Education provided Yes   Education Details Functional hand tasks, ROM   Person(s) Educated Patient   Methods Explanation;Demonstration;Verbal cues   Comprehension Verbal cues required;Returned demonstration;Verbalized understanding             OT Long Term Goals - 08/18/14 1540    OT LONG TERM GOAL #1   Title Patient will improve R hand grip by 10# to open jars, containers and water bottles.    Baseline has moderate difficulty with opening containers.   Time 8   Period Weeks   Status New   OT LONG TERM GOAL #2   Title Patient will improve grip sufficient to cut food with a knife  with modified independence   Baseline unable, family has to assist   Time 8   Period Weeks   Status New   OT LONG TERM GOAL #3   Title Edema in patients right hand will decrease .5 cm at the wrist and around MPs to demonstrate full ROM for full functional grasp and release of objects.   Baseline increased edema in right hand and wrist at evaluation, by 1cm in wrist and MPs   Time 8   Period Weeks   Status New   OT LONG TERM GOAL #4   Title Patient will improve coordination in right hand to produce a secure tripod grasp on pen and complete handwriting with 90% legibility.    Baseline micrographia and difficulty holding writing utensil.   Time 8   Period Weeks   Status New   OT LONG TERM GOAL #5   Title Patient will improve grasp on right by 2# of force to open a bag of chips.   Baseline unable at  evaluation   Time 8   Period Weeks   Status New   Long Term Additional Goals   Additional Long Term Goals Yes   OT LONG TERM GOAL #6   Title Patient will complete basic self care with modified independence.     Baseline minimal assist and increased time required at evaluation   Time 8   Period Weeks   Status New   OT LONG TERM GOAL #7   Title Patient will complete light meal prep with modified independence.   Baseline unable to perform cooking tasks at evaluation.   Time 8   Period Weeks   Status New               Plan - 09/17/14 1540    Clinical Impression Statement Patient was able to demonstrate improved ability to manipulate objects in hand this date, pick up items from table with increased ease.  Patient has continued to progress with right hand use for tasks at home and continues to work towards home exercises and consistency to improve functional use of his right hand.    Pt will benefit from skilled therapeutic intervention in order to improve on the following deficits (Retired) Decreased endurance;Decreased strength;Impaired flexibility;Decreased balance;Difficulty walking;Decreased range of motion;Increased edema;Pain;Decreased coordination;Impaired UE functional use   Rehab Potential Good   OT Frequency 2x / week   OT Duration 8 weeks   OT Treatment/Interventions Self-care/ADL training;Therapeutic exercise;Patient/family education;Neuromuscular education;Manual Therapy;DME and/or AE instruction;Passive range of motion;Contrast Bath;Moist Heat   Consulted and Agree with Plan of Care Patient        Problem List There are no active problems to display for this patient.  Achilles Dunk, OTR/L, CLT  Lovett,Amy 09/18/2014, 3:46 PM  [place] MAIN [facility] SERVICES [address] [place], [place], [address] Phone: [phone]   Fax:  [phone]

## 2014-09-19 ENCOUNTER — Ambulatory Visit: Payer: Federal, State, Local not specified - PPO | Admitting: Occupational Therapy

## 2014-09-19 DIAGNOSIS — R460 Very low level of personal hygiene: Secondary | ICD-10-CM

## 2014-09-19 DIAGNOSIS — Z741 Need for assistance with personal care: Secondary | ICD-10-CM

## 2014-09-19 DIAGNOSIS — R279 Unspecified lack of coordination: Secondary | ICD-10-CM

## 2014-09-19 DIAGNOSIS — M6281 Muscle weakness (generalized): Secondary | ICD-10-CM | POA: Diagnosis not present

## 2014-09-19 NOTE — Therapy (Signed)
[place] MAIN [facility] SERVICES [address] [place], [place], [address] Phone: [phone]   Fax:  [phone]  Occupational Therapy Treatment  Patient Details  Name: Charles Stanton MRN: [id number] Date of Birth: 06/18/1955 Referring Lisanne Ponce:  Vladimir Crofts, MD  Encounter Date: 09/19/2014      OT End of Session - 09/19/14 1623    Visit Number 7   Number of Visits 16   Date for OT Re-Evaluation 10/16/14   OT Start Time 1330   OT Stop Time 1416   OT Time Calculation (min) 46 min   Activity Tolerance Patient tolerated treatment well   Behavior During Therapy [facility] for tasks assessed/performed      Past Medical History  Diagnosis Date  . Hypertension   . Parkinson's disease   . Hyperlipemia     No past surgical history on file.  There were no vitals filed for this visit.  Visit Diagnosis:  Muscle weakness  Lack of coordination  Self-care deficit for bathing and hygiene  Self-care deficit for dressing and grooming      Subjective Assessment - 09/19/14 1622    Patient is accompained by: Family member   Patient Stated Goals "My goal is to be able to cut a steak".  Patient wants to be able to do more with his right hand, especially with handwriting, cutting food and picking up things."   Currently in Pain? No/denies   Multiple Pain Sites No                      OT Treatments/Exercises (OP) - 09/19/14 1622    Fine Motor Coordination   Other Fine Motor Exercises  Patient seen for in hand manipulation of 1/2 inch objects, translatory movements of the hand and using the hand for storage.  Cues provided by therapist.Manipulation of [place] bimanual work task with bolts and nuts with cues for technique, right hand to lead the task with cues.    Other Fine Motor Exercises Finger ABD/ADD, flexion/extension with cues for end range stretch, oppositional movements.    Neurological Re-education Exercises   Other  Exercises 1 Patient seen for resistive reciprocal arm movements with resistance of 2.0 to 2.5 with alternating directions for 5 minutes, 2# weights for bilateral alternating shoulder flexion, chest press, elbow flexion/extension for 10 reps for 2 sets each.                  OT Education - 09/19/14 1622    Education provided Yes   Education Details HEP, hand coordination tasks   Person(s) Educated Patient   Methods Explanation;Demonstration;Verbal cues   Comprehension Verbal cues required;Returned demonstration;Verbalized understanding             OT Long Term Goals - 08/18/14 1540    OT LONG TERM GOAL #1   Title Patient will improve R hand grip by 10# to open jars, containers and water bottles.    Baseline has moderate difficulty with opening containers.   Time 8   Period Weeks   Status New   OT LONG TERM GOAL #2   Title Patient will improve grip sufficient to cut food with a knife with modified independence   Baseline unable, family has to assist   Time 8   Period Weeks   Status New   OT LONG TERM GOAL #3   Title Edema in patients right hand will decrease .5 cm at the wrist and around MPs to demonstrate  full ROM for full functional grasp and release of objects.   Baseline increased edema in right hand and wrist at evaluation, by 1cm in wrist and MPs   Time 8   Period Weeks   Status New   OT LONG TERM GOAL #4   Title Patient will improve coordination in right hand to produce a secure tripod grasp on pen and complete handwriting with 90% legibility.    Baseline micrographia and difficulty holding writing utensil.   Time 8   Period Weeks   Status New   OT LONG TERM GOAL #5   Title Patient will improve grasp on right by 2# of force to open a bag of chips.   Baseline unable at evaluation   Time 8   Period Weeks   Status New   Long Term Additional Goals   Additional Long Term Goals Yes   OT LONG TERM GOAL #6   Title Patient will complete basic self care with  modified independence.     Baseline minimal assist and increased time required at evaluation   Time 8   Period Weeks   Status New   OT LONG TERM GOAL #7   Title Patient will complete light meal prep with modified independence.   Baseline unable to perform cooking tasks at evaluation.   Time 8   Period Weeks   Status New               Plan - 09/19/14 1624    Clinical Impression Statement Patient continues to progress, able to demo improved ROM of right hand this date with full finger flexion and extension, tapping, ABD and ADD of the fingers as well as oppositional movements.  Strength and coordination are improving for tasks in the clinic and at home.  Patient is pleased with his current progress.    Pt will benefit from skilled therapeutic intervention in order to improve on the following deficits (Retired) Decreased endurance;Decreased strength;Impaired flexibility;Decreased balance;Difficulty walking;Decreased range of motion;Increased edema;Pain;Decreased coordination;Impaired UE functional use   Rehab Potential Good   OT Frequency 2x / week   OT Duration 8 weeks   OT Treatment/Interventions Self-care/ADL training;Therapeutic exercise;Patient/family education;Neuromuscular education;Manual Therapy;DME and/or AE instruction;Passive range of motion;Contrast Bath;Moist Heat   Consulted and Agree with Plan of Care Patient        Problem List There are no active problems to display for this patient.  Achilles Dunk, OTR/L, CLT Lovett,Amy 09/19/2014, 4:30 PM  [place] MAIN [facility] SERVICES [address] [place], [place], [address] Phone: [phone]   Fax:  [phone]

## 2014-09-24 ENCOUNTER — Ambulatory Visit: Payer: Federal, State, Local not specified - PPO | Admitting: Occupational Therapy

## 2014-09-24 DIAGNOSIS — Z741 Need for assistance with personal care: Secondary | ICD-10-CM

## 2014-09-24 DIAGNOSIS — R460 Very low level of personal hygiene: Secondary | ICD-10-CM

## 2014-09-24 DIAGNOSIS — R279 Unspecified lack of coordination: Secondary | ICD-10-CM

## 2014-09-24 DIAGNOSIS — M6281 Muscle weakness (generalized): Secondary | ICD-10-CM | POA: Diagnosis not present

## 2014-09-25 NOTE — Therapy (Signed)
[place] MAIN [facility] SERVICES [address] [place], [place], [address] Phone: [phone]   Fax:  [phone]  Occupational Therapy Treatment  Patient Details  Name: Charles Stanton MRN: [id number] Date of Birth: March 29, 1955 Referring Provider:  Vladimir Crofts, MD  Encounter Date: 09/24/2014      OT End of Session - 09/24/14 1702    Visit Number 8   Number of Visits 16   Date for OT Re-Evaluation 10/16/14   OT Start Time 1100   OT Stop Time 1145   OT Time Calculation (min) 45 min   Activity Tolerance Patient tolerated treatment well   Behavior During Therapy [facility] for tasks assessed/performed      Past Medical History  Diagnosis Date  . Hypertension   . Parkinson's disease   . Hyperlipemia     No past surgical history on file.  There were no vitals filed for this visit.  Visit Diagnosis:  Muscle weakness  Lack of coordination  Self-care deficit for bathing and hygiene  Self-care deficit for dressing and grooming      Subjective Assessment - 09/24/14 1701    Subjective  Patient reports "this is one of those days", reports he is not doing well this date.  Patient normally smiling and joking but quiet and reserved this date.    Patient Stated Goals "My goal is to be able to cut a steak".  Patient wants to be able to do more with his right hand, especially with handwriting, cutting food and picking up things."   Currently in Pain? No/denies   Multiple Pain Sites No                      OT Treatments/Exercises (OP) - 09/25/14 0001    Fine Motor Coordination   Other Fine Motor Exercises Manipulation of round ball peg objects with cues for prehension patterns to pick up and move from fingertips to palm, using the hand for storage and then cues to retrieve items and move to fingertips one at a time. Diona Foley shaped more of a challenge for maintaining hold.    Neurological Re-education Exercises   Other Exercises 1 Patient  seen for grip strengthening tasks with right hand with setting on 11# and 17# for 25 reps each with short break between sets, sustained grip hold.  Resistive theraputty for right grip, pinch skills and coordination tasks for multiple reps.     Other Exercises 2 Resistive upper body reciprocal arm patterns with varying resistance of 2.0 to 2.4, forwards, backwards and cues for grip.                OT Education - 09/24/14 1702    Education provided Yes   Education Details HEP, putty exercises, functional use of right hand, stretches to MPs   Person(s) Educated Patient   Methods Explanation;Demonstration;Verbal cues   Comprehension Verbal cues required;Returned demonstration;Verbalized understanding             OT Long Term Goals - 08/18/14 1540    OT LONG TERM GOAL #1   Title Patient will improve R hand grip by 10# to open jars, containers and water bottles.    Baseline has moderate difficulty with opening containers.   Time 8   Period Weeks   Status New   OT LONG TERM GOAL #2   Title Patient will improve grip sufficient to cut food with a knife with modified independence   Baseline unable, family  has to assist   Time 8   Period Weeks   Status New   OT LONG TERM GOAL #3   Title Edema in patients right hand will decrease .5 cm at the wrist and around MPs to demonstrate full ROM for full functional grasp and release of objects.   Baseline increased edema in right hand and wrist at evaluation, by 1cm in wrist and MPs   Time 8   Period Weeks   Status New   OT LONG TERM GOAL #4   Title Patient will improve coordination in right hand to produce a secure tripod grasp on pen and complete handwriting with 90% legibility.    Baseline micrographia and difficulty holding writing utensil.   Time 8   Period Weeks   Status New   OT LONG TERM GOAL #5   Title Patient will improve grasp on right by 2# of force to open a bag of chips.   Baseline unable at evaluation   Time 8    Period Weeks   Status New   Long Term Additional Goals   Additional Long Term Goals Yes   OT LONG TERM GOAL #6   Title Patient will complete basic self care with modified independence.     Baseline minimal assist and increased time required at evaluation   Time 8   Period Weeks   Status New   OT LONG TERM GOAL #7   Title Patient will complete light meal prep with modified independence.   Baseline unable to perform cooking tasks at evaluation.   Time 8   Period Weeks   Status New               Plan - 09/24/14 1703    Clinical Impression Statement Patient quiet this date and reports feeling weak and "off" today.  Continued focus on translatory skills of the hand for coordination, strength for grip as well as ROM of the right hand.  Patient performing exercises at home daily between therapy sessions.    Pt will benefit from skilled therapeutic intervention in order to improve on the following deficits (Retired) Decreased endurance;Decreased strength;Impaired flexibility;Decreased balance;Difficulty walking;Decreased range of motion;Increased edema;Pain;Decreased coordination;Impaired UE functional use   Rehab Potential Good   OT Frequency 2x / week   OT Duration 8 weeks   OT Treatment/Interventions Self-care/ADL training;Therapeutic exercise;Patient/family education;Neuromuscular education;Manual Therapy;DME and/or AE instruction;Passive range of motion;Contrast Bath;Moist Heat   OT Home Exercise Plan Will plan to issue HEP for RUE strength and coordination tasks.   Consulted and Agree with Plan of Care Patient        Problem List There are no active problems to display for this patient.  Achilles Dunk, OTR/L, CLT Lovett,Amy 09/25/2014, 10:56 AM  [place] MAIN [facility] SERVICES [address] [place], [place], [address] Phone: [phone]   Fax:  [phone]

## 2014-09-26 ENCOUNTER — Ambulatory Visit: Payer: Federal, State, Local not specified - PPO | Admitting: Occupational Therapy

## 2014-09-26 DIAGNOSIS — M6281 Muscle weakness (generalized): Secondary | ICD-10-CM | POA: Diagnosis not present

## 2014-09-26 DIAGNOSIS — Z741 Need for assistance with personal care: Secondary | ICD-10-CM

## 2014-09-26 DIAGNOSIS — R279 Unspecified lack of coordination: Secondary | ICD-10-CM

## 2014-09-26 DIAGNOSIS — R460 Very low level of personal hygiene: Secondary | ICD-10-CM

## 2014-09-26 NOTE — Therapy (Signed)
[place] MAIN [facility] SERVICES [address] [place], [place], [address] Phone: [phone]   Fax:  [phone]  Occupational Therapy Treatment  Patient Details  Name: Charles Stanton MRN: [id number] Date of Birth: 12-06-1955 Referring Provider:  Vladimir Crofts, MD  Encounter Date: 09/26/2014      OT End of Session - 09/26/14 1649    Visit Number 9   Number of Visits 16   Date for OT Re-Evaluation 10/16/14   OT Start Time 1100   OT Stop Time 1146   OT Time Calculation (min) 46 min   Activity Tolerance Patient tolerated treatment well   Behavior During Therapy [facility] for tasks assessed/performed      Past Medical History  Diagnosis Date  . Hypertension   . Parkinson's disease   . Hyperlipemia     No past surgical history on file.  There were no vitals filed for this visit.  Visit Diagnosis:  Muscle weakness  Lack of coordination  Self-care deficit for bathing and hygiene  Self-care deficit for dressing and grooming      Subjective Assessment - 09/26/14 1109    Subjective  Patient reports he fell yesterday trying to drain the soft hot tub.  Denies getting hurt, waited for wife to get home to finish the project of cleaning the filters.    Patient Stated Goals "My goal is to be able to cut a steak".  Patient wants to be able to do more with his right hand, especially with handwriting, cutting food and picking up things."   Currently in Pain? No/denies   Multiple Pain Sites No                      OT Treatments/Exercises (OP) - 09/26/14 1701    Fine Motor Coordination   Other Fine Motor Exercises  Patient seen for in hand manipulation of grooved pegboard objects, translatory movements of the hand and using the hand for storage.  Cues provided by therapist. Knotting and unknotting exercises performed with cues for right hand to lead task.     Neurological Re-education Exercises   Other Exercises 1 Patient seen for  resistive pinch skills with all levels of pins yellow to black, placed onto elevated surface with use of right hand and cues. 3# dowel exercises for shoulder flexion, AB/ADD, forwards circles, backwards circles, chest press for 10 reps for 2 sets each.                  OT Education - 09/26/14 1649    Education provided No   Education Details HEP   Person(s) Educated Patient   Methods Explanation;Demonstration;Verbal cues   Comprehension Verbal cues required;Returned demonstration;Verbalized understanding             OT Long Term Goals - 08/18/14 1540    OT LONG TERM GOAL #1   Title Patient will improve R hand grip by 10# to open jars, containers and water bottles.    Baseline has moderate difficulty with opening containers.   Time 8   Period Weeks   Status New   OT LONG TERM GOAL #2   Title Patient will improve grip sufficient to cut food with a knife with modified independence   Baseline unable, family has to assist   Time 8   Period Weeks   Status New   OT LONG TERM GOAL #3   Title Edema in patients right hand will decrease .5 cm at  the wrist and around MPs to demonstrate full ROM for full functional grasp and release of objects.   Baseline increased edema in right hand and wrist at evaluation, by 1cm in wrist and MPs   Time 8   Period Weeks   Status New   OT LONG TERM GOAL #4   Title Patient will improve coordination in right hand to produce a secure tripod grasp on pen and complete handwriting with 90% legibility.    Baseline micrographia and difficulty holding writing utensil.   Time 8   Period Weeks   Status New   OT LONG TERM GOAL #5   Title Patient will improve grasp on right by 2# of force to open a bag of chips.   Baseline unable at evaluation   Time 8   Period Weeks   Status New   Long Term Additional Goals   Additional Long Term Goals Yes   OT LONG TERM GOAL #6   Title Patient will complete basic self care with modified independence.      Baseline minimal assist and increased time required at evaluation   Time 8   Period Weeks   Status New   OT LONG TERM GOAL #7   Title Patient will complete light meal prep with modified independence.   Baseline unable to perform cooking tasks at evaluation.   Time 8   Period Weeks   Status New               Plan - 09/26/14 1650    Clinical Impression Statement Patient has continued to improve with his fine motor coordination skills, increased smoothness of movements, dexterity improved, accuracy and speed with tasks.  He reports noticing the difference.  His strength continues to improve but still struggles at home with select tasks and has some days of weakness greater than others. He benefits from alternating strength and coordination tasks and does well with verbal cues.     Pt will benefit from skilled therapeutic intervention in order to improve on the following deficits (Retired) Decreased endurance;Decreased strength;Impaired flexibility;Decreased balance;Difficulty walking;Decreased range of motion;Increased edema;Pain;Decreased coordination;Impaired UE functional use   Rehab Potential Good   OT Frequency 2x / week   OT Duration 8 weeks   OT Treatment/Interventions Self-care/ADL training;Therapeutic exercise;Patient/family education;Neuromuscular education;Manual Therapy;DME and/or AE instruction;Passive range of motion;Contrast Bath;Moist Heat   OT Home Exercise Plan Will plan to issue HEP for RUE strength and coordination tasks.   Consulted and Agree with Plan of Care Patient        Problem List There are no active problems to display for this patient.  Achilles Dunk, OTR/L, CLT  Lovett,Amy 09/26/2014, 5:04 PM  [place] MAIN [facility] SERVICES [address] [place], [place], [address] Phone: [phone]   Fax:  [phone]

## 2014-09-30 ENCOUNTER — Ambulatory Visit: Payer: Federal, State, Local not specified - PPO | Admitting: Occupational Therapy

## 2014-09-30 DIAGNOSIS — M6281 Muscle weakness (generalized): Secondary | ICD-10-CM

## 2014-09-30 DIAGNOSIS — R279 Unspecified lack of coordination: Secondary | ICD-10-CM

## 2014-09-30 NOTE — Therapy (Signed)
[place] MAIN [facility] SERVICES [address] [place], [place], [address] Phone: [phone]   Fax:  [phone]  Occupational Therapy Treatment  Patient Details  Name: Charles Stanton MRN: [id number] Date of Birth: September 02, 1955 Referring Provider:  Vladimir Crofts, MD  Encounter Date: 09/30/2014    Past Medical History  Diagnosis Date  . Hypertension   . Parkinson's disease   . Hyperlipemia     No past surgical history on file.  There were no vitals filed for this visit.  Visit Diagnosis:  Muscle weakness  Lack of coordination Picked up coins starting largest to smallest and placed in container pooling 5 per time. Used Purdue peg board all three components and removed alternating fingers for pegs.  Completed twisty grip from hardest to easiest on 5 settings with 10 repititions. Gripper set on 30, 23,18, 11, and 7 lbs X 10. Cues for technique. Extra time needed for each activity.                                   OT Long Term Goals - 08/18/14 1540    OT LONG TERM GOAL #1   Title Patient will improve R hand grip by 10# to open jars, containers and water bottles.    Baseline has moderate difficulty with opening containers.   Time 8   Period Weeks   Status New   OT LONG TERM GOAL #2   Title Patient will improve grip sufficient to cut food with a knife with modified independence   Baseline unable, family has to assist   Time 8   Period Weeks   Status New   OT LONG TERM GOAL #3   Title Edema in patients right hand will decrease .5 cm at the wrist and around MPs to demonstrate full ROM for full functional grasp and release of objects.   Baseline increased edema in right hand and wrist at evaluation, by 1cm in wrist and MPs   Time 8   Period Weeks   Status New   OT LONG TERM GOAL #4   Title Patient will improve coordination in right hand to produce a secure tripod grasp on pen and complete handwriting with 90%  legibility.    Baseline micrographia and difficulty holding writing utensil.   Time 8   Period Weeks   Status New   OT LONG TERM GOAL #5   Title Patient will improve grasp on right by 2# of force to open a bag of chips.   Baseline unable at evaluation   Time 8   Period Weeks   Status New   Long Term Additional Goals   Additional Long Term Goals Yes   OT LONG TERM GOAL #6   Title Patient will complete basic self care with modified independence.     Baseline minimal assist and increased time required at evaluation   Time 8   Period Weeks   Status New   OT LONG TERM GOAL #7   Title Patient will complete light meal prep with modified independence.   Baseline unable to perform cooking tasks at evaluation.   Time 8   Period Weeks   Status New               Problem List There are no active problems to display for this patient. Sharon Mt, MS/OTR/L   Valente David M 09/30/2014, 3:30 PM  [place]  [place] MAIN [facility] SERVICES [address] [place], [place], [address] Phone: [phone]   Fax:  [phone]

## 2014-10-02 ENCOUNTER — Ambulatory Visit: Payer: Federal, State, Local not specified - PPO | Admitting: Occupational Therapy

## 2014-10-02 ENCOUNTER — Encounter: Payer: Self-pay | Admitting: Occupational Therapy

## 2014-10-02 DIAGNOSIS — M6281 Muscle weakness (generalized): Secondary | ICD-10-CM

## 2014-10-02 DIAGNOSIS — R460 Very low level of personal hygiene: Secondary | ICD-10-CM

## 2014-10-02 DIAGNOSIS — R279 Unspecified lack of coordination: Secondary | ICD-10-CM

## 2014-10-02 DIAGNOSIS — Z741 Need for assistance with personal care: Secondary | ICD-10-CM

## 2014-10-03 NOTE — Therapy (Signed)
[place] MAIN [facility] SERVICES [address] [place], [place], [address] Phone: [phone]   Fax:  [phone]  Occupational Therapy Treatment  Patient Details  Name: Charles Stanton MRN: [id number] Date of Birth: [birth year] Referring Provider:  Vladimir Crofts, MD  Encounter Date: 10/02/2014      OT End of Session - 10/02/14 1445    Visit Number 10   Number of Visits 16   Date for OT Re-Evaluation 10/16/14   OT Start Time 1400   OT Stop Time 1450   OT Time Calculation (min) 50 min   Activity Tolerance Patient tolerated treatment well   Behavior During Therapy [facility] for tasks assessed/performed      Past Medical History  Diagnosis Date  . Hypertension   . Parkinson's disease   . Hyperlipemia     History reviewed. No pertinent past surgical history.  There were no vitals filed for this visit.  Visit Diagnosis:  Muscle weakness  Lack of coordination  Self-care deficit for bathing and hygiene  Self-care deficit for dressing and grooming      Subjective Assessment - 10/02/14 1439    Subjective  Patient reports he is having a tough day, fingers feel stiff this date.  No pain reported.  Reports his wedding anniversary is today, 32 years, going out to dinner to celebrate.    Patient is accompained by: Family member   Patient Stated Goals "My goal is to be able to cut a steak".  Patient wants to be able to do more with his right hand, especially with handwriting, cutting food and picking up things."   Currently in Pain? No/denies   Multiple Pain Sites No                      OT Treatments/Exercises (OP) - 10/02/14 1513    Fine Motor Coordination   Other Fine Motor Exercises Patient seen for translatory movements of the right hand with dowel stick holding with tripod grasp with cues.  Manipulation of coins from tabletop  and progressing to moving to palm and back to fingertips to place into bank.     Neurological  Re-education Exercises   Other Exercises 1 Patient seen for multidirectional reaching tasks with 1# weight to right UE, resistive grip for 17# for 25 reps then 11# for 20 reps.  Resistive finger ABD/ADD.  Stretches to right hand for finger extension at MPs.                OT Education - 10/02/14 2021    Education provided Yes   Education Details ROM exercises for the hand, coordination tasks.   Person(s) Educated Patient   Methods Explanation;Demonstration;Verbal cues   Comprehension Verbal cues required;Returned demonstration;Verbalized understanding             OT Long Term Goals - 08/18/14 1540    OT LONG TERM GOAL #1   Title Patient will improve R hand grip by 10# to open jars, containers and water bottles.    Baseline has moderate difficulty with opening containers.   Time 8   Period Weeks   Status New   OT LONG TERM GOAL #2   Title Patient will improve grip sufficient to cut food with a knife with modified independence   Baseline unable, family has to assist   Time 8   Period Weeks   Status New   OT LONG TERM GOAL #3   Title Edema in patients  right hand will decrease .5 cm at the wrist and around MPs to demonstrate full ROM for full functional grasp and release of objects.   Baseline increased edema in right hand and wrist at evaluation, by 1cm in wrist and MPs   Time 8   Period Weeks   Status New   OT LONG TERM GOAL #4   Title Patient will improve coordination in right hand to produce a secure tripod grasp on pen and complete handwriting with 90% legibility.    Baseline micrographia and difficulty holding writing utensil.   Time 8   Period Weeks   Status New   OT LONG TERM GOAL #5   Title Patient will improve grasp on right by 2# of force to open a bag of chips.   Baseline unable at evaluation   Time 8   Period Weeks   Status New   Long Term Additional Goals   Additional Long Term Goals Yes   OT LONG TERM GOAL #6   Title Patient will complete basic  self care with modified independence.     Baseline minimal assist and increased time required at evaluation   Time 8   Period Weeks   Status New   OT LONG TERM GOAL #7   Title Patient will complete light meal prep with modified independence.   Baseline unable to perform cooking tasks at evaluation.   Time 8   Period Weeks   Status New               Plan - 10/02/14 1450    Pt will benefit from skilled therapeutic intervention in order to improve on the following deficits (Retired) Decreased endurance;Decreased strength;Impaired flexibility;Decreased balance;Difficulty walking;Decreased range of motion;Increased edema;Pain;Decreased coordination;Impaired UE functional use   Rehab Potential Good   OT Frequency 2x / week   OT Duration 8 weeks   OT Treatment/Interventions Self-care/ADL training;Therapeutic exercise;Patient/family education;Neuromuscular education;Manual Therapy;DME and/or AE instruction;Passive range of motion;Contrast Bath;Moist Heat   OT Home Exercise Plan Will plan to issue HEP for RUE strength and coordination tasks.   Consulted and Agree with Plan of Care Patient        Problem List There are no active problems to display for this patient.  Achilles Dunk, OTR/L, CLT  Lovett,Amy 10/03/2014, 3:16 PM  [place] MAIN [facility] SERVICES [address] [place], [place], [address] Phone: [phone]   Fax:  [phone]

## 2014-10-07 ENCOUNTER — Ambulatory Visit: Payer: Federal, State, Local not specified - PPO | Attending: Neurology | Admitting: Occupational Therapy

## 2014-10-07 DIAGNOSIS — Z741 Need for assistance with personal care: Secondary | ICD-10-CM

## 2014-10-07 DIAGNOSIS — R460 Very low level of personal hygiene: Secondary | ICD-10-CM | POA: Insufficient documentation

## 2014-10-07 DIAGNOSIS — R279 Unspecified lack of coordination: Secondary | ICD-10-CM | POA: Diagnosis present

## 2014-10-07 DIAGNOSIS — M6281 Muscle weakness (generalized): Secondary | ICD-10-CM | POA: Insufficient documentation

## 2014-10-08 NOTE — Therapy (Signed)
[place] MAIN [facility] SERVICES [address] [place], [place], [address] Phone: [phone]   Fax:  [phone]  Occupational Therapy Treatment  Patient Details  Name: Charles Stanton MRN: [id number] Date of Birth: 21-Aug-1955 Referring Provider:  Vladimir Crofts, MD  Encounter Date: 10/07/2014      OT End of Session - 10/07/14 1035    Visit Number 11   Number of Visits 16   Date for OT Re-Evaluation 10/16/14   OT Start Time 0955   OT Stop Time 1050   OT Time Calculation (min) 55 min   Activity Tolerance Patient tolerated treatment well   Behavior During Therapy [facility] for tasks assessed/performed      Past Medical History  Diagnosis Date  . Hypertension   . Parkinson's disease   . Hyperlipemia     No past surgical history on file.  There were no vitals filed for this visit.  Visit Diagnosis:  Muscle weakness  Lack of coordination  Self-care deficit for bathing and hygiene  Self-care deficit for dressing and grooming      Subjective Assessment - 10/07/14 1009    Subjective  Patient reports his cutting skills with food are getting better than in the past.  Reports handwriting is about the same and cannot see much change.  Went for a physical yesterday and everything was good. Has to have bloodwork next week.    Patient Stated Goals "My goal is to be able to cut a steak".  Patient wants to be able to do more with his right hand, especially with handwriting, cutting food and picking up things."   Currently in Pain? No/denies   Multiple Pain Sites No                      OT Treatments/Exercises (OP) - 10/07/14 1016    ADLs   ADL Comments Patient seen for cutting exercise with consistency of chicken and/or steak.     Fine Motor Coordination   Other Fine Motor Exercises Coordination exercises with right hand for manipulation of small 1/2 inch objects from putty, manipulating to place into grid with cues. Patient seen  for manipulation of pieces of perdue pegboard.     Neurological Re-education Exercises   Other Exercises 1 Weighted ball toss in standing for 2 sets of 10 reps with 2000g ball. Overhead weighted ball press 2000g for 3 sets of 10 reps, chest press 2 sets of 10 reps. Alternating shoulder taps with elbow extension in between, 2 sets 10 reps all with 2000g weighted ball.  Grip strength 17# for 25 reps, 11# for 25 reps.                  OT Education - 10/07/14 1034    Education provided Yes   Education Details HEP, strength and coordination tasks. Financial risk analyst) Educated Patient   Methods Explanation;Demonstration;Verbal cues   Comprehension Verbal cues required;Returned demonstration;Verbalized understanding             OT Long Term Goals - 10/08/14 1524    OT LONG TERM GOAL #1   Title Patient will improve R hand grip by 10# to open jars, containers and water bottles.    Time 8   Period Weeks   Status On-going   OT LONG TERM GOAL #2   Title Patient will improve grip sufficient to cut food with a knife with modified independence   Time 8  Period Weeks   Status Achieved   OT LONG TERM GOAL #3   Title Edema in patients right hand will decrease .5 cm at the wrist and around MPs to demonstrate full ROM for full functional grasp and release of objects.   Time 8   Period Weeks   Status Achieved   OT LONG TERM GOAL #4   Title Patient will improve coordination in right hand to produce a secure tripod grasp on pen and complete handwriting with 90% legibility.    Time 8   Period Weeks   Status On-going   OT LONG TERM GOAL #5   Title Patient will improve grasp on right by 2# of force to open a bag of chips.   Time 8   Period Weeks   Status On-going   OT LONG TERM GOAL #6   Title Patient will complete basic self care with modified independence.     Time 8   Period Weeks   Status Achieved   OT LONG TERM GOAL #7   Title Patient will complete light meal prep  with modified independence.   Time 8   Period Weeks   Status On-going               Plan - 10/07/14 1035    Clinical Impression Statement Patient continues to make progress in all areas and is participating in tasks at home.  He demonstrates improved coordination in the right hand and has been able to perform increased resistance with strengthening tasks.  Continue to focus on these areas to impact his daily self care and IADL tasks.     Pt will benefit from skilled therapeutic intervention in order to improve on the following deficits (Retired) Decreased endurance;Decreased strength;Impaired flexibility;Decreased balance;Difficulty walking;Decreased range of motion;Increased edema;Pain;Decreased coordination;Impaired UE functional use   Rehab Potential Good   OT Frequency 2x / week   OT Duration 8 weeks   OT Treatment/Interventions Self-care/ADL training;Therapeutic exercise;Patient/family education;Neuromuscular education;Manual Therapy;DME and/or AE instruction;Passive range of motion;Contrast Bath;Moist Heat   Consulted and Agree with Plan of Care Patient        Problem List There are no active problems to display for this patient.  Achilles Dunk, OTR/L, CLT [place] 10/08/2014, 3:25 PM  [place] MAIN [facility] SERVICES [address] [place], [place], [address] Phone: [phone]   Fax:  [phone]

## 2014-10-09 ENCOUNTER — Ambulatory Visit: Payer: Federal, State, Local not specified - PPO | Admitting: Occupational Therapy

## 2014-10-09 DIAGNOSIS — R279 Unspecified lack of coordination: Secondary | ICD-10-CM

## 2014-10-09 DIAGNOSIS — R460 Very low level of personal hygiene: Secondary | ICD-10-CM

## 2014-10-09 DIAGNOSIS — M6281 Muscle weakness (generalized): Secondary | ICD-10-CM

## 2014-10-09 DIAGNOSIS — Z741 Need for assistance with personal care: Secondary | ICD-10-CM

## 2014-10-09 NOTE — Therapy (Signed)
[place] MAIN [facility] SERVICES [address] [place], [place], [address] Phone: [phone]   Fax:  [phone]  Occupational Therapy Treatment  Patient Details  Name: Charles Stanton MRN: [id number] Date of Birth: [birth year] Referring Provider:  Vladimir Crofts, MD  Encounter Date: 10/09/2014      OT End of Session - 10/09/14 1055    Visit Number 12   Number of Visits 16   Date for OT Re-Evaluation 10/16/14   OT Start Time 0957   OT Stop Time 1054   OT Time Calculation (min) 57 min   Activity Tolerance Patient tolerated treatment well   Behavior During Therapy [facility] for tasks assessed/performed      Past Medical History  Diagnosis Date  . Hypertension   . Parkinson's disease   . Hyperlipemia     No past surgical history on file.  There were no vitals filed for this visit.  Visit Diagnosis:  Muscle weakness  Lack of coordination  Self-care deficit for bathing and hygiene  Self-care deficit for dressing and grooming      Subjective Assessment - 10/09/14 1054    Subjective  Patient reports nothing new in the last few days, reports he will have the grandchildren some this weekend and is looking forwards to it.     Patient Stated Goals "My goal is to be able to cut a steak".  Patient wants to be able to do more with his right hand, especially with handwriting, cutting food and picking up things."   Currently in Pain? No/denies   Multiple Pain Sites No                      OT Treatments/Exercises (OP) - 10/09/14 1127    ADLs   Writing Patient seen for handwriting skills with cues for tripod grasp of pen, formation of larger scale letters and symbols with lined paper to assist with sizing.  Followed by trials of name in script.   Fine Motor Coordination   Other Fine Motor Exercises Patient seen for coordination exercises with right hand, manipulation of marbles from tabletop, translatory movements of the hand to move  to the palm and back to fingertips.  Similar movement patterns with checker sized pieces and placing into an elevated plane of motion.     Neurological Re-education Exercises   Other Exercises 1 4 # dowel exercises for shoulder flexion, ABD, ADD, forwards and backwards circles for 10 reps each.     Other Exercises 2 Resistive bilateral wrist exerciser for 15 reps.                OT Education - 10/09/14 1054    Education provided Yes   Education Details HEP, handwriting tasks.   Person(s) Educated Patient   Methods Explanation;Demonstration;Verbal cues   Comprehension Verbal cues required;Returned demonstration;Verbalized understanding             OT Long Term Goals - 10/08/14 1524    OT LONG TERM GOAL #1   Title Patient will improve R hand grip by 10# to open jars, containers and water bottles.    Time 8   Period Weeks   Status On-going   OT LONG TERM GOAL #2   Title Patient will improve grip sufficient to cut food with a knife with modified independence   Time 8   Period Weeks   Status Achieved   OT LONG TERM GOAL #3   Title Edema in patients  right hand will decrease .5 cm at the wrist and around MPs to demonstrate full ROM for full functional grasp and release of objects.   Time 8   Period Weeks   Status Achieved   OT LONG TERM GOAL #4   Title Patient will improve coordination in right hand to produce a secure tripod grasp on pen and complete handwriting with 90% legibility.    Time 8   Period Weeks   Status On-going   OT LONG TERM GOAL #5   Title Patient will improve grasp on right by 2# of force to open a bag of chips.   Time 8   Period Weeks   Status On-going   OT LONG TERM GOAL #6   Title Patient will complete basic self care with modified independence.     Time 8   Period Weeks   Status Achieved   OT LONG TERM GOAL #7   Title Patient will complete light meal prep with modified independence.   Time 8   Period Weeks   Status On-going                Plan - 10/09/14 1055    Clinical Impression Statement Patient performance improved this date versus last session.  He was able to increase resistance and weight of all tasks.  Coordination improving but has poor endurance in the right hand with handwriting.  His legibility decreases as he continues towards the end of each line.     Pt will benefit from skilled therapeutic intervention in order to improve on the following deficits (Retired) Decreased endurance;Decreased strength;Impaired flexibility;Decreased balance;Difficulty walking;Decreased range of motion;Increased edema;Pain;Decreased coordination;Impaired UE functional use   Rehab Potential Good   OT Frequency 2x / week   OT Duration 8 weeks   OT Treatment/Interventions Self-care/ADL training;Therapeutic exercise;Patient/family education;Neuromuscular education;Manual Therapy;DME and/or AE instruction;Passive range of motion;Contrast Bath;Moist Heat   Consulted and Agree with Plan of Care Patient        Problem List There are no active problems to display for this patient.  Achilles Dunk, OTR/L, CLT  Jeffrie Stander 10/09/2014, 11:38 AM  [place] MAIN [facility] SERVICES [address] [place], [place], [address] Phone: [phone]   Fax:  [phone]

## 2014-10-14 ENCOUNTER — Ambulatory Visit: Payer: Federal, State, Local not specified - PPO | Admitting: Occupational Therapy

## 2014-10-14 ENCOUNTER — Encounter: Payer: Self-pay | Admitting: Occupational Therapy

## 2014-10-14 DIAGNOSIS — M6281 Muscle weakness (generalized): Secondary | ICD-10-CM | POA: Diagnosis not present

## 2014-10-14 DIAGNOSIS — R460 Very low level of personal hygiene: Secondary | ICD-10-CM

## 2014-10-14 DIAGNOSIS — Z741 Need for assistance with personal care: Secondary | ICD-10-CM

## 2014-10-14 DIAGNOSIS — R279 Unspecified lack of coordination: Secondary | ICD-10-CM

## 2014-10-14 NOTE — Therapy (Signed)
[place] MAIN [facility] SERVICES [address] [place], [place], [address] Phone: [phone]   Fax:  [phone]  Occupational Therapy Treatment  Patient Details  Name: Charles Stanton MRN: [id number] Date of Birth: 07-13-1955 Referring Provider:  Vladimir Crofts, MD  Encounter Date: 10/14/2014      OT End of Session - 10/14/14 1642    Visit Number 13   Number of Visits 16   Date for OT Re-Evaluation 10/16/14   OT Start Time 1000   OT Stop Time 1058   OT Time Calculation (min) 58 min   Activity Tolerance Patient tolerated treatment well   Behavior During Therapy [facility] for tasks assessed/performed      Past Medical History  Diagnosis Date  . Hypertension   . Parkinson's disease ([place])   . Hyperlipemia     History reviewed. No pertinent past surgical history.  There were no vitals filed for this visit.  Visit Diagnosis:  Muscle weakness - Plan: Ot plan of care cert/re-cert  Lack of coordination - Plan: Ot plan of care cert/re-cert  Self-care deficit for bathing and hygiene - Plan: Ot plan of care cert/re-cert  Self-care deficit for dressing and grooming - Plan: Ot plan of care cert/re-cert      Subjective Assessment - 10/14/14 1641    Subjective  Patient reports he can tell he is doing more at home and has to ask people less to help him.  He feels he has made good progress with therapy.  Surprised at the changes in his grip strength and motion.   Patient Stated Goals "My goal is to be able to cut a steak".  Patient wants to be able to do more with his right hand, especially with handwriting, cutting food and picking up things."   Currently in Pain? No/denies   Multiple Pain Sites No                      OT Treatments/Exercises (OP) - 10/14/14 1631    ADLs   Writing Focused on handwriting for numbers this date, 5 reps of each with cues for size of letters and lined paper as a guide.    ADL Comments Reassessment of  ADLs, refer to goals for updated status.  Patient has continued difficulty with IADL tasks such as carrying groceries, managing a laundry basket, lifting heavy pots/pans, draining pasta and placing casserole dish in the oven (he is the primary cook at home), Handwriting continues to demo decreased legiblity.   Fine Motor Coordination   Other Fine Motor Exercises Patient seen for manipulation of small beads and nylon rope.    Neurological Re-education Exercises   Other Exercises 1 Reassessment of skills:  Grip strength on right 52#, left 63#.  Pinch strength:  lateral R 17#, L 19#, 3 point pinch R 17#, L 18#, 2 point pinch R 12#, L 14#.  9 hole peg test R 28 secs, L 24 secs.  Right shoulder flexion 150 degrees, wrist extension to 52 degrees.     Other Exercises 2 Patient seen for pinch skills with all levels of resistive pinch pins placing onto elevated surface.  Grip strength with hand gripper for sustained grip 11# for 25 reps.  Multi directional reach with resistance.                  OT Education - 10/14/14 1642    Education provided Yes   Education Details Handwriting skills, IADLs, goals  and HEP   Person(s) Educated Patient   Methods Explanation;Demonstration;Verbal cues   Comprehension Verbal cues required;Returned demonstration;Verbalized understanding             OT Long Term Goals - 10/14/14 1020    OT LONG TERM GOAL #1   Title Patient will improve R hand grip by 10# to open jars, containers and water bottles.    Time 8   Period Weeks   Status Achieved   OT LONG TERM GOAL #2   Title Patient will improve grip sufficient to cut food with a knife with modified independence   Time 8   Period Weeks   Status Achieved   OT LONG TERM GOAL #3   Title Edema in patients right hand will decrease .5 cm at the wrist and around MPs to demonstrate full ROM for full functional grasp and release of objects.   Time 8   Period Weeks   Status Achieved   OT LONG TERM GOAL #4    Title Patient will improve coordination in right hand to produce a secure tripod grasp on pen and complete handwriting with 90% legibility.    Time 8   Period Weeks   Status On-going   OT LONG TERM GOAL #5   Title Patient will improve grasp on right by 2# of force to open a bag of chips.   Time 8   Period Weeks   Status Achieved   Long Term Additional Goals   Additional Long Term Goals Yes   OT LONG TERM GOAL #6   Title Patient will complete basic self care with modified independence.     Time 8   Period Weeks   Status Achieved   OT LONG TERM GOAL #7   Title Patient will complete light meal prep with modified independence.   Time 8   Period Weeks   Status Achieved   OT LONG TERM GOAL #8   Title Patient will demonstrate increased strength in right UE to carry groceries from the car to the house with modified independence.   Time 8   Period Weeks   Status New   OT LONG TERM GOAL  #9   Baseline Patient will demonstrate understanding of alternative ways to manage and carry laundry to the laundry room safely.   Time 8   Period Weeks   Status New   OT LONG TERM GOAL  #10   TITLE Patient will demonstrate the ability to safely pick up a pot of pasta to drain the water and place a full casserole dish in the oven with supervision.   Time 8   Period Weeks   Status New               Plan - 10/14/14 1643    Clinical Impression Statement Patient has made excellent progress with the strength, range of motion, coordination and functional use of his right hand to perform necessary, daily tasks at home.  His grip strength on the right almost doubled with focus in this area.  He has met all his initial goals with the exception of handwriting.  He would benefit from continued skilled OT intervention to work further on his right arm weakness, coordination, hand writing and participation in higher level IADL tasks.     Pt will benefit from skilled therapeutic intervention in order to  improve on the following deficits (Retired) Decreased endurance;Decreased strength;Impaired flexibility;Decreased balance;Difficulty walking;Decreased range of motion;Increased edema;Pain;Decreased coordination;Impaired UE functional use   Rehab Potential Good  OT Frequency 2x / week   OT Duration 8 weeks   OT Treatment/Interventions Self-care/ADL training;Therapeutic exercise;Patient/family education;Neuromuscular education;Manual Therapy;DME and/or AE instruction;Passive range of motion;Contrast Bath;Moist Heat   Consulted and Agree with Plan of Care Patient        Problem List There are no active problems to display for this patient.  Achilles Dunk, OTR/L, CLT  Shaquoia Miers 10/14/2014, 4:53 PM  [place] MAIN [facility] SERVICES [address] [place], [place], [address] Phone: [phone]   Fax:  [phone]

## 2014-10-16 ENCOUNTER — Ambulatory Visit: Payer: Federal, State, Local not specified - PPO | Admitting: Occupational Therapy

## 2014-10-16 DIAGNOSIS — R460 Very low level of personal hygiene: Secondary | ICD-10-CM

## 2014-10-16 DIAGNOSIS — M6281 Muscle weakness (generalized): Secondary | ICD-10-CM

## 2014-10-16 DIAGNOSIS — R279 Unspecified lack of coordination: Secondary | ICD-10-CM

## 2014-10-16 DIAGNOSIS — Z741 Need for assistance with personal care: Secondary | ICD-10-CM

## 2014-10-17 NOTE — Therapy (Signed)
[place] MAIN [facility] SERVICES [address] [place], [place], [address] Phone: [phone]   Fax:  [phone]  Occupational Therapy Treatment  Patient Details  Name: Charles Stanton MRN: [id number] Date of Birth: 1955/08/17 No Data Recorded  Encounter Date: 10/16/2014      OT End of Session - 10/16/14 0915    Visit Number 14   Number of Visits 16   Date for OT Re-Evaluation 12/09/14   OT Start Time 0859   OT Stop Time 1000   OT Time Calculation (min) 61 min   Activity Tolerance Patient tolerated treatment well   Behavior During Therapy [facility] for tasks assessed/performed      Past Medical History  Diagnosis Date  . Hypertension   . Parkinson's disease ([place])   . Hyperlipemia     No past surgical history on file.  There were no vitals filed for this visit.  Visit Diagnosis:  Muscle weakness  Lack of coordination  Self-care deficit for bathing and hygiene  Self-care deficit for dressing and grooming      Subjective Assessment - 10/16/14 0914    Subjective  Patient denies any pain today, "Its a good day, [employer] called yesterday and I can start volunteering Monday."     Patient Stated Goals "My goal is to be able to cut a steak".  Patient wants to be able to do more with his right hand, especially with handwriting, cutting food and picking up things."   Currently in Pain? No/denies   Multiple Pain Sites No                      OT Treatments/Exercises (OP) - 10/16/14 0957    ADLs   Writing Handwriting this date for name, address and phone number with cues for size of letters and using lined paper as a guide.     Fine Motor Coordination   Other Fine Motor Exercises Patient seen for manipulation of small objects and pieces from [employer] with small dowel, washers and collars with cues for in hand manipulation skills and isolated movements.  Manipulation of objects 1/2 inch in size from resistive putty.  Manipulation  of 1 inch pegs turning from top to bottom with isolated finger movements.    Neurological Re-education Exercises   Other Exercises 1 Triceps press for 2 sets of 10 reps for 7.5#, biceps curl single arm 2.5# for 10 reps for 2 sets each arm.  Forward press for 2.5# for 10 reps for 2 sets.  Grip strengthening with resistive putty for 10 reps each exercise.                OT Education - 10/16/14 0915    Education provided Yes   Education Details HEP, handwriting, ADLs   Person(s) Educated Patient   Methods Explanation;Demonstration;Verbal cues   Comprehension Verbalized understanding;Returned demonstration;Verbal cues required             OT Long Term Goals - 10/14/14 1020    OT LONG TERM GOAL #1   Title Patient will improve R hand grip by 10# to open jars, containers and water bottles.    Time 8   Period Weeks   Status Achieved   OT LONG TERM GOAL #2   Title Patient will improve grip sufficient to cut food with a knife with modified independence   Time 8   Period Weeks   Status Achieved   OT LONG TERM GOAL #3  Title Edema in patients right hand will decrease .5 cm at the wrist and around MPs to demonstrate full ROM for full functional grasp and release of objects.   Time 8   Period Weeks   Status Achieved   OT LONG TERM GOAL #4   Title Patient will improve coordination in right hand to produce a secure tripod grasp on pen and complete handwriting with 90% legibility.    Time 8   Period Weeks   Status On-going   OT LONG TERM GOAL #5   Title Patient will improve grasp on right by 2# of force to open a bag of chips.   Time 8   Period Weeks   Status Achieved   Long Term Additional Goals   Additional Long Term Goals Yes   OT LONG TERM GOAL #6   Title Patient will complete basic self care with modified independence.     Time 8   Period Weeks   Status Achieved   OT LONG TERM GOAL #7   Title Patient will complete light meal prep with modified independence.   Time  8   Period Weeks   Status Achieved   OT LONG TERM GOAL #8   Title Patient will demonstrate increased strength in right UE to carry groceries from the car to the house with modified independence.   Time 8   Period Weeks   Status New   OT LONG TERM GOAL  #9   Baseline Patient will demonstrate understanding of alternative ways to manage and carry laundry to the laundry room safely.   Time 8   Period Weeks   Status New   OT LONG TERM GOAL  #10   TITLE Patient will demonstrate the ability to safely pick up a pot of pasta to drain the water and place a full casserole dish in the oven with supervision.   Time 8   Period Weeks   Status New               Plan - 10/16/14 0916    Clinical Impression Statement Patient continues to progress well in all areas of treatment, strength improving along with ROM and coordination skills to complete activities at home and in the community.  Continues to require cues for exercises for technique.     Pt will benefit from skilled therapeutic intervention in order to improve on the following deficits (Retired) Decreased endurance;Decreased strength;Impaired flexibility;Decreased balance;Difficulty walking;Decreased range of motion;Increased edema;Pain;Decreased coordination;Impaired UE functional use   Rehab Potential Good   OT Frequency 2x / week   OT Duration 8 weeks   OT Treatment/Interventions Self-care/ADL training;Therapeutic exercise;Patient/family education;Neuromuscular education;Manual Therapy;DME and/or AE instruction;Passive range of motion;Contrast Bath;Moist Heat   Consulted and Agree with Plan of Care Patient        Problem List There are no active problems to display for this patient.  Achilles Dunk, OTR/L, CLT Lovett,Amy 10/17/2014, 3:29 PM  [place] MAIN [facility] SERVICES [address] [place], [place], [address] Phone: [phone]   Fax:  [phone]  Name: Charles Stanton MRN:  [id number] Date of Birth: 1955/02/12

## 2014-10-21 ENCOUNTER — Ambulatory Visit: Payer: Federal, State, Local not specified - PPO | Admitting: Occupational Therapy

## 2014-10-21 DIAGNOSIS — Z741 Need for assistance with personal care: Secondary | ICD-10-CM

## 2014-10-21 DIAGNOSIS — M6281 Muscle weakness (generalized): Secondary | ICD-10-CM | POA: Diagnosis not present

## 2014-10-21 DIAGNOSIS — R279 Unspecified lack of coordination: Secondary | ICD-10-CM

## 2014-10-21 DIAGNOSIS — R460 Very low level of personal hygiene: Secondary | ICD-10-CM

## 2014-10-21 NOTE — Therapy (Signed)
[place] MAIN [facility] SERVICES [address] [place], [place], [address] Phone: [phone]   Fax:  [phone]  Occupational Therapy Treatment  Patient Details  Name: Charles Stanton MRN: [id number] Date of Birth: [birth year] No Data Recorded  Encounter Date: 10/21/2014      OT End of Session - 10/21/14 1553    Visit Number 15   Number of Visits 16   Date for OT Re-Evaluation 12/09/14   OT Start Time 0855   OT Stop Time 0944   OT Time Calculation (min) 49 min   Activity Tolerance Patient tolerated treatment well   Behavior During Therapy [facility] for tasks assessed/performed      Past Medical History  Diagnosis Date  . Hypertension   . Parkinson's disease ([place])   . Hyperlipemia     No past surgical history on file.  There were no vitals filed for this visit.  Visit Diagnosis:  Muscle weakness  Lack of coordination  Self-care deficit for bathing and hygiene  Self-care deficit for dressing and grooming      Subjective Assessment - 10/21/14 0930    Subjective  Patient reports he was able to cut his food at the restaurant last night, had chicken.  Needs to switch appt tomorrow due to a conflicting appt.    Patient Stated Goals "My goal is to be able to cut a steak".  Patient wants to be able to do more with his right hand, especially with handwriting, cutting food and picking up things."   Currently in Pain? No/denies   Multiple Pain Sites No                      OT Treatments/Exercises (OP) - 10/21/14 1547    Fine Motor Coordination   Other Fine Motor Exercises Manipulation of [place] like discs, right hand only for 2 full sets, flipping to other colored side, BUE use for simultaneous flipping for 2 sets with cues.  Grooved pegboard with right hand to manipulate  and place into grid, removed each and used translatory movements of the hand to store objects in the palm, 2 sets.   Neurological Re-education Exercises    Other Exercises 1 Patient seen for 2# wrist weight on right UE reaching in multi level directions to move items from one level to the next.  2# weight BUE for bicep curl, front shoulder punch, shoulder flexion for 10 reps for 2 set each exercise.  Resistive reciprocal arm movements with resistance at 3.5 to 3.7 this date, forwards/backwards.     Other Exercises 2 Medium red putty for grip strength, lateral pinch, 3 point pinch and 2 point pinch for multiple reps and 2 sets each.                  OT Education - 10/21/14 1553    Education provided Yes   Education Details HEP for right UE, coordination tasks   Person(s) Educated Patient   Methods Explanation;Demonstration;Verbal cues   Comprehension Verbal cues required;Returned demonstration;Verbalized understanding             OT Long Term Goals - 10/14/14 1020    OT LONG TERM GOAL #1   Title Patient will improve R hand grip by 10# to open jars, containers and water bottles.    Time 8   Period Weeks   Status Achieved   OT LONG TERM GOAL #2   Title Patient will improve grip sufficient to cut food  with a knife with modified independence   Time 8   Period Weeks   Status Achieved   OT LONG TERM GOAL #3   Title Edema in patients right hand will decrease .5 cm at the wrist and around MPs to demonstrate full ROM for full functional grasp and release of objects.   Time 8   Period Weeks   Status Achieved   OT LONG TERM GOAL #4   Title Patient will improve coordination in right hand to produce a secure tripod grasp on pen and complete handwriting with 90% legibility.    Time 8   Period Weeks   Status On-going   OT LONG TERM GOAL #5   Title Patient will improve grasp on right by 2# of force to open a bag of chips.   Time 8   Period Weeks   Status Achieved   Long Term Additional Goals   Additional Long Term Goals Yes   OT LONG TERM GOAL #6   Title Patient will complete basic self care with modified independence.      Time 8   Period Weeks   Status Achieved   OT LONG TERM GOAL #7   Title Patient will complete light meal prep with modified independence.   Time 8   Period Weeks   Status Achieved   OT LONG TERM GOAL #8   Title Patient will demonstrate increased strength in right UE to carry groceries from the car to the house with modified independence.   Time 8   Period Weeks   Status New   OT LONG TERM GOAL  #9   Baseline Patient will demonstrate understanding of alternative ways to manage and carry laundry to the laundry room safely.   Time 8   Period Weeks   Status New   OT LONG TERM GOAL  #10   TITLE Patient will demonstrate the ability to safely pick up a pot of pasta to drain the water and place a full casserole dish in the oven with supervision.   Time 8   Period Weeks   Status New               Plan - 10/21/14 1554    Clinical Impression Statement Patient's speed and dexterity of right hand improving with tasks in the clinic and now able to go out to dinner and cut his own food without the assistance of his wife.  Strength continues to improve which has helped with opening containers and performing self care and household tasks.  He continues to benefit from skilled OT to increase his independence in daily activities.    Pt will benefit from skilled therapeutic intervention in order to improve on the following deficits (Retired) Decreased endurance;Decreased strength;Impaired flexibility;Decreased balance;Difficulty walking;Decreased range of motion;Increased edema;Pain;Decreased coordination;Impaired UE functional use   Rehab Potential Good   OT Frequency 2x / week   OT Duration 8 weeks   OT Treatment/Interventions Self-care/ADL training;Therapeutic exercise;Patient/family education;Neuromuscular education;Manual Therapy;DME and/or AE instruction;Passive range of motion;Contrast Bath;Moist Heat   Consulted and Agree with Plan of Care Patient        Problem List There are no  active problems to display for this patient.  Achilles Dunk, OTR/L, CLT  Gene Colee 10/21/2014, 3:57 PM  [place] MAIN [facility] SERVICES [address] [place], [place], [address] Phone: [phone]   Fax:  [phone]  Name: Charles Stanton MRN: [id number] Date of Birth: 23-Apr-1955

## 2014-10-22 ENCOUNTER — Ambulatory Visit: Payer: Federal, State, Local not specified - PPO | Admitting: Occupational Therapy

## 2014-10-22 DIAGNOSIS — R279 Unspecified lack of coordination: Secondary | ICD-10-CM

## 2014-10-22 DIAGNOSIS — M6281 Muscle weakness (generalized): Secondary | ICD-10-CM

## 2014-10-22 DIAGNOSIS — Z741 Need for assistance with personal care: Secondary | ICD-10-CM

## 2014-10-22 DIAGNOSIS — R460 Very low level of personal hygiene: Secondary | ICD-10-CM

## 2014-10-23 NOTE — Therapy (Signed)
[place] MAIN [facility] SERVICES [address] [place], [place], [address] Phone: [phone]   Fax:  [phone]  Occupational Therapy Treatment  Patient Details  Name: Charles Stanton MRN: [id number] Date of Birth: [birth year] No Data Recorded  Encounter Date: 10/22/2014      OT End of Session - 10/23/14 1051    Visit Number 16   Number of Visits 32   Date for OT Re-Evaluation 12/09/14   OT Start Time 1320   OT Stop Time 1401   OT Time Calculation (min) 41 min   Activity Tolerance Patient tolerated treatment well   Behavior During Therapy [facility] for tasks assessed/performed      Past Medical History  Diagnosis Date  . Hypertension   . Parkinson's disease ([place])   . Hyperlipemia     No past surgical history on file.  There were no vitals filed for this visit.  Visit Diagnosis:  Muscle weakness  Lack of coordination  Self-care deficit for bathing and hygiene  Self-care deficit for dressing and grooming      Subjective Assessment - 10/22/14 1355    Subjective  Patient reports he will be on vacation for the next 10 days and has appointments for when he returns.  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   Currently in Pain? No/denies   Multiple Pain Sites No                      OT Treatments/Exercises (OP) - 10/23/14 1515    Fine Motor Coordination   Other Fine Motor Exercises Manipulation of coins from tabletop and placing into resistive bank, difficulty with quarters more so than other coins.  cues for using the hand for storage.   Neurological Re-education Exercises   Other Exercises 1 Patient seen for UB strengthening exercises for triceps press for 2 sets of 10 reps, 7.5#, biceps curl 2.5 single arm for 2 sets of 10.  Front press 7.5# for 2 sets of 10 reps.  Grip strengthening with 11# for 25 reps.                OT Education -  10/23/14 1050    Education provided Yes   Education Details strengthening, self care and tasks while on trip.   Person(s) Educated Patient   Methods Explanation;Demonstration;Verbal cues   Comprehension Verbal cues required;Returned demonstration;Verbalized understanding             OT Long Term Goals - 10/14/14 1020    OT LONG TERM GOAL #1   Title Patient will improve R hand grip by 10# to open jars, containers and water bottles.    Time 8   Period Weeks   Status Achieved   OT LONG TERM GOAL #2   Title Patient will improve grip sufficient to cut food with a knife with modified independence   Time 8   Period Weeks   Status Achieved   OT LONG TERM GOAL #3   Title Edema in patients right hand will decrease .5 cm at the wrist and around MPs to demonstrate full ROM for full functional grasp and release of objects.   Time 8   Period Weeks   Status Achieved   OT LONG TERM GOAL #4   Title Patient will improve coordination in right hand to produce a secure tripod grasp on pen and complete handwriting with 90% legibility.  Time 8   Period Weeks   Status On-going   OT LONG TERM GOAL #5   Title Patient will improve grasp on right by 2# of force to open a bag of chips.   Time 8   Period Weeks   Status Achieved   Long Term Additional Goals   Additional Long Term Goals Yes   OT LONG TERM GOAL #6   Title Patient will complete basic self care with modified independence.     Time 8   Period Weeks   Status Achieved   OT LONG TERM GOAL #7   Title Patient will complete light meal prep with modified independence.   Time 8   Period Weeks   Status Achieved   OT LONG TERM GOAL #8   Title Patient will demonstrate increased strength in right UE to carry groceries from the car to the house with modified independence.   Time 8   Period Weeks   Status New   OT LONG TERM GOAL  #9   Baseline Patient will demonstrate understanding of alternative ways to manage and carry laundry to the  laundry room safely.   Time 8   Period Weeks   Status New   OT LONG TERM GOAL  #10   TITLE Patient will demonstrate the ability to safely pick up a pot of pasta to drain the water and place a full casserole dish in the oven with supervision.   Time 8   Period Weeks   Status New               Plan - 10/22/14 1400    Clinical Impression Statement Patient will be on vacation for the next 10 days and will return after vacation.  He continues to improve with strength and coordination and has been more involved with daily acts at home including cutting meat, dressing and bathing tasks.  He has been assisting with light homemaking and cooking with increased independence.     Pt will benefit from skilled therapeutic intervention in order to improve on the following deficits (Retired) Decreased endurance;Decreased strength;Impaired flexibility;Decreased balance;Difficulty walking;Decreased range of motion;Increased edema;Pain;Decreased coordination;Impaired UE functional use   Rehab Potential Good   OT Frequency 2x / week   OT Duration 8 weeks   OT Treatment/Interventions Self-care/ADL training;Therapeutic exercise;Patient/family education;Neuromuscular education;Manual Therapy;DME and/or AE instruction;Passive range of motion;Contrast Bath;Moist Heat   Consulted and Agree with Plan of Care Patient        Problem List There are no active problems to display for this patient.  Achilles Dunk, OTR/L, CLT Leeba Barbe 10/23/2014, 3:22 PM  [place] MAIN [facility] SERVICES [address] [place], [place], [address] Phone: [phone]   Fax:  [phone]  Name: Charles Stanton MRN: [id number] Date of Birth: [birth year]

## 2014-11-10 ENCOUNTER — Ambulatory Visit: Payer: Federal, State, Local not specified - PPO | Admitting: Occupational Therapy

## 2014-11-12 ENCOUNTER — Ambulatory Visit: Payer: Federal, State, Local not specified - PPO | Attending: Neurology | Admitting: Occupational Therapy

## 2014-11-12 DIAGNOSIS — M6281 Muscle weakness (generalized): Secondary | ICD-10-CM | POA: Diagnosis not present

## 2014-11-12 DIAGNOSIS — R460 Very low level of personal hygiene: Secondary | ICD-10-CM | POA: Diagnosis present

## 2014-11-12 DIAGNOSIS — R279 Unspecified lack of coordination: Secondary | ICD-10-CM | POA: Diagnosis present

## 2014-11-12 DIAGNOSIS — Z741 Need for assistance with personal care: Secondary | ICD-10-CM

## 2014-11-13 NOTE — Therapy (Signed)
[place] MAIN [facility] SERVICES [address] [place], [place], [address] Phone: [phone]   Fax:  [phone]  Occupational Therapy Treatment  Patient Details  Name: Charles Stanton MRN: [id number] Date of Birth: 1955/11/01 No Data Recorded  Encounter Date: 11/12/2014      OT End of Session - 11/12/14 1555    Visit Number 17   Number of Visits 32   Date for OT Re-Evaluation 12/09/14   OT Start Time 1356   OT Stop Time 1444   OT Time Calculation (min) 48 min   Activity Tolerance Patient tolerated treatment well   Behavior During Therapy [facility] for tasks assessed/performed      Past Medical History  Diagnosis Date  . Hypertension   . Parkinson's disease ([place])   . Hyperlipemia     No past surgical history on file.  There were no vitals filed for this visit.  Visit Diagnosis:  Muscle weakness  Lack of coordination  Self-care deficit for bathing and hygiene  Self-care deficit for dressing and grooming      Subjective Assessment - 11/12/14 1553    Subjective  Patient reports he has been trying to do more around the house, has been cooking some for light meal prep but still needs some assistance with putting objects in and out of the oven and managing heavy pots and pans.    Patient is accompained by: Family member   Patient Stated Goals "My goal is to be able to cut a steak".  Patient wants to be able to do more with his right hand, especially with handwriting, cutting food and picking up things."   Currently in Pain? No/denies   Multiple Pain Sites No                      OT Treatments/Exercises (OP) - 11/12/14 2039    ADLs   Writing Patient seen for focus on amplitude/size of letter formation for hand writing with select letters followed by trials of patient name and city of residence.  Cues using lined paper of various sizes along with verbal cues for grip on pen.    Fine Motor Coordination   Other Fine Motor  Exercises Patient seen this date for knotting and unknotting exercises for tying of clothing and shoelaces, cues for technique.  Utilized a variety of tension on nylon with knots.    Neurological Re-education Exercises   Other Exercises 1 Patient seen for resistive pinch skills with yellow, red, green pinch pins placing onto elevated surface of varying heights. Grip strengthening with resistive putty for 10 reps for 2 sets.                OT Education - 11/12/14 1554    Education provided Yes   Education Details HEP, IADL tasks   Person(s) Educated Patient   Methods Explanation;Demonstration;Verbal cues   Comprehension Verbal cues required;Returned demonstration;Verbalized understanding             OT Long Term Goals - 10/14/14 1020    OT LONG TERM GOAL #1   Title Patient will improve R hand grip by 10# to open jars, containers and water bottles.    Time 8   Period Weeks   Status Achieved   OT LONG TERM GOAL #2   Title Patient will improve grip sufficient to cut food with a knife with modified independence   Time 8   Period Weeks   Status Achieved  OT LONG TERM GOAL #3   Title Edema in patients right hand will decrease .5 cm at the wrist and around MPs to demonstrate full ROM for full functional grasp and release of objects.   Time 8   Period Weeks   Status Achieved   OT LONG TERM GOAL #4   Title Patient will improve coordination in right hand to produce a secure tripod grasp on pen and complete handwriting with 90% legibility.    Time 8   Period Weeks   Status On-going   OT LONG TERM GOAL #5   Title Patient will improve grasp on right by 2# of force to open a bag of chips.   Time 8   Period Weeks   Status Achieved   Long Term Additional Goals   Additional Long Term Goals Yes   OT LONG TERM GOAL #6   Title Patient will complete basic self care with modified independence.     Time 8   Period Weeks   Status Achieved   OT LONG TERM GOAL #7   Title Patient  will complete light meal prep with modified independence.   Time 8   Period Weeks   Status Achieved   OT LONG TERM GOAL #8   Title Patient will demonstrate increased strength in right UE to carry groceries from the car to the house with modified independence.   Time 8   Period Weeks   Status New   OT LONG TERM GOAL  #9   Baseline Patient will demonstrate understanding of alternative ways to manage and carry laundry to the laundry room safely.   Time 8   Period Weeks   Status New   OT LONG TERM GOAL  #10   TITLE Patient will demonstrate the ability to safely pick up a pot of pasta to drain the water and place a full casserole dish in the oven with supervision.   Time 8   Period Weeks   Status New               Plan - 11/12/14 1556    Clinical Impression Statement Patient reports he had to cancel his appt earlier this week secondary to daughter having surgery.  He performed well this date with increased pinch and grip with tasks and requiring decreased rest breaks.  He had some difficulty with unknotting acts with increased tension in the nylon cording.Continue to work towards goals to improve independence in daily tasks.     Pt will benefit from skilled therapeutic intervention in order to improve on the following deficits (Retired) Decreased endurance;Decreased strength;Impaired flexibility;Decreased balance;Difficulty walking;Decreased range of motion;Increased edema;Pain;Decreased coordination;Impaired UE functional use   Rehab Potential Good   OT Frequency 2x / week   OT Duration 8 weeks   OT Treatment/Interventions Self-care/ADL training;Therapeutic exercise;Patient/family education;Neuromuscular education;Manual Therapy;DME and/or AE instruction;Passive range of motion;Contrast Bath;Moist Heat   Consulted and Agree with Plan of Care Patient        Problem List There are no active problems to display for this patient.  Achilles Dunk, OTR/L,  CLT  Lovett,Amy 11/13/2014, 8:46 PM  [place] MAIN [facility] SERVICES [address] [place], [place], [address] Phone: [phone]   Fax:  [phone]  Name: Charles Stanton MRN: [id number] Date of Birth: 1955/03/27

## 2014-11-17 ENCOUNTER — Ambulatory Visit: Payer: Federal, State, Local not specified - PPO | Admitting: Occupational Therapy

## 2014-11-17 DIAGNOSIS — M6281 Muscle weakness (generalized): Secondary | ICD-10-CM | POA: Diagnosis not present

## 2014-11-17 DIAGNOSIS — R460 Very low level of personal hygiene: Secondary | ICD-10-CM

## 2014-11-17 DIAGNOSIS — Z741 Need for assistance with personal care: Secondary | ICD-10-CM

## 2014-11-17 DIAGNOSIS — R279 Unspecified lack of coordination: Secondary | ICD-10-CM

## 2014-11-18 ENCOUNTER — Encounter: Payer: Self-pay | Admitting: Occupational Therapy

## 2014-11-18 NOTE — Therapy (Signed)
[place] MAIN [facility] SERVICES [address] [place], [place], [address] Phone: [phone]   Fax:  [phone]  Occupational Therapy Treatment  Patient Details  Name: FLAVEL COSTNER MRN: [id number] Date of Birth: 1955/07/14 No Data Recorded  Encounter Date: 11/17/2014      OT End of Session - 11/18/14 1939    Visit Number 18   Number of Visits 32   Date for OT Re-Evaluation 12/09/14   OT Start Time 1000   OT Stop Time 1058   OT Time Calculation (min) 58 min   Activity Tolerance Patient tolerated treatment well   Behavior During Therapy [facility] for tasks assessed/performed      Past Medical History  Diagnosis Date  . Hypertension   . Parkinson's disease ([place])   . Hyperlipemia     History reviewed. No pertinent past surgical history.  There were no vitals filed for this visit.  Visit Diagnosis:  Muscle weakness  Lack of coordination  Self-care deficit for bathing and hygiene  Self-care deficit for dressing and grooming                    OT Treatments/Exercises (OP) - 11/18/14 1948    ADLs   Cooking Patient seen this date for instruction on managing heavy pots and pans from sink to stovetop, into and out of the oven.  5# dish placed into and out of the oven from a standing position from the right side and from left side of the oven as well as from a seated position beside the stove.  Managed pot of water from sink to stove top and to the sink for draining with simulation of cooking pasta.  Instructed on safety with all tasks.Patient instructed on use of oven stick to manage oven shelf and improve safety around heat.    Neurological Re-education Exercises   Other Exercises 1 Patient seen for multi directional reaching with and without weights up to 3#.  Grip strengthening this date on 4th setting of gripper for 25# for 25 reps.  Instructed on stretches for right hand in addition to weight bearing techniques between  exercises.                 OT Education - 11/17/14 1934    Education provided Yes   Education Details home exercises, ways to manage kitchen items and compensatory techniques to manage heavy items safely.   Person(s) Educated Patient   Methods Explanation;Demonstration;Verbal cues   Comprehension Verbal cues required;Returned demonstration;Verbalized understanding             OT Long Term Goals - 10/14/14 1020    OT LONG TERM GOAL #1   Title Patient will improve R hand grip by 10# to open jars, containers and water bottles.    Time 8   Period Weeks   Status Achieved   OT LONG TERM GOAL #2   Title Patient will improve grip sufficient to cut food with a knife with modified independence   Time 8   Period Weeks   Status Achieved   OT LONG TERM GOAL #3   Title Edema in patients right hand will decrease .5 cm at the wrist and around MPs to demonstrate full ROM for full functional grasp and release of objects.   Time 8   Period Weeks   Status Achieved   OT LONG TERM GOAL #4   Title Patient will improve coordination in right hand to produce a secure tripod  grasp on pen and complete handwriting with 90% legibility.    Time 8   Period Weeks   Status On-going   OT LONG TERM GOAL #5   Title Patient will improve grasp on right by 2# of force to open a bag of chips.   Time 8   Period Weeks   Status Achieved   Long Term Additional Goals   Additional Long Term Goals Yes   OT LONG TERM GOAL #6   Title Patient will complete basic self care with modified independence.     Time 8   Period Weeks   Status Achieved   OT LONG TERM GOAL #7   Title Patient will complete light meal prep with modified independence.   Time 8   Period Weeks   Status Achieved   OT LONG TERM GOAL #8   Title Patient will demonstrate increased strength in right UE to carry groceries from the car to the house with modified independence.   Time 8   Period Weeks   Status New   OT LONG TERM GOAL  #9    Baseline Patient will demonstrate understanding of alternative ways to manage and carry laundry to the laundry room safely.   Time 8   Period Weeks   Status New   OT LONG TERM GOAL  #10   TITLE Patient will demonstrate the ability to safely pick up a pot of pasta to drain the water and place a full casserole dish in the oven with supervision.   Time 8   Period Weeks   Status New               Plan - 11/18/14 1941    Clinical Impression Statement Patient struggles with managing heavy items in the kitchen as well managing items into and out of the oven.  He was excited about using adaptive equipment for managing the oven rack and placing items in and out of the oven.  Issued oven stick for patient to try at home over the next few days.  Patient demos understanding of safety with placing items into and out of the oven as well as positioning of self for optimal results.  He would benefit from additional instruction on managing heavy items from oven, stovetop and to sink.  Will continue to address and provide education as well as instruct on additional compensatory techniques.    Pt will benefit from skilled therapeutic intervention in order to improve on the following deficits (Retired) Decreased endurance;Decreased strength;Impaired flexibility;Decreased balance;Difficulty walking;Decreased range of motion;Increased edema;Pain;Decreased coordination;Impaired UE functional use   Rehab Potential Good   OT Frequency 2x / week   OT Duration 8 weeks   OT Treatment/Interventions Self-care/ADL training;Therapeutic exercise;Patient/family education;Neuromuscular education;Manual Therapy;DME and/or AE instruction;Passive range of motion;Contrast Bath;Moist Heat   Consulted and Agree with Plan of Care Patient        Problem List There are no active problems to display for this patient. Achilles Dunk, OTR/L, CLT Quandarius Nill 11/18/2014, 7:58 PM  [place] MAIN  [facility] SERVICES [address] [place], [place], [address] Phone: [phone]   Fax:  [phone]  Name: ALEXI HERRERO MRN: [id number] Date of Birth: [birth year]

## 2014-11-19 ENCOUNTER — Ambulatory Visit: Payer: Federal, State, Local not specified - PPO | Admitting: Occupational Therapy

## 2014-11-19 ENCOUNTER — Encounter: Payer: Self-pay | Admitting: Occupational Therapy

## 2014-11-19 DIAGNOSIS — M6281 Muscle weakness (generalized): Secondary | ICD-10-CM | POA: Diagnosis not present

## 2014-11-19 DIAGNOSIS — R279 Unspecified lack of coordination: Secondary | ICD-10-CM

## 2014-11-19 DIAGNOSIS — Z741 Need for assistance with personal care: Secondary | ICD-10-CM

## 2014-11-19 DIAGNOSIS — R460 Very low level of personal hygiene: Secondary | ICD-10-CM

## 2014-11-20 NOTE — Therapy (Signed)
[place] MAIN [facility] SERVICES [address] [place], [place], [address] Phone: [phone]   Fax:  [phone]  Occupational Therapy Treatment  Patient Details  Name: TAYJON TRETTEL MRN: [id number] Date of Birth: 07-04-1955 No Data Recorded  Encounter Date: 11/19/2014      OT End of Session - 11/19/14 1637    Visit Number 19   Number of Visits 32   Date for OT Re-Evaluation 12/09/14   OT Start Time 0958   OT Stop Time 1100   OT Time Calculation (min) 62 min   Activity Tolerance Patient tolerated treatment well   Behavior During Therapy [facility] for tasks assessed/performed      Past Medical History  Diagnosis Date  . Hypertension   . Parkinson's disease ([place])   . Hyperlipemia     History reviewed. No pertinent past surgical history.  There were no vitals filed for this visit.  Visit Diagnosis:  Muscle weakness  Lack of coordination  Self-care deficit for bathing and hygiene  Self-care deficit for dressing and grooming                    OT Treatments/Exercises (OP) - 11/19/14 1903    ADLs   Cooking Patient able to demo use of oven stick and was able to use at home successfully.    ADL Comments Patient instructed on safety with functional mobility in the home, recommend patient remove scatter rugs in the home and especially in the bathrooom.  Instructed on fall recovery and the need to be able have access to call for help.     Fine Motor Coordination   Other Fine Motor Exercises Coordination tasks with right hand for manipulation of 1/2 inch to one inch objects from table top, translatory movements of the hand and using the hand for storage.   Neurological Re-education Exercises   Other Exercises 1 Continued focus on RUE strengthening with grip and pinch with resistance.                  OT Education - 11/20/14 1636    Education provided Yes   Education Details HEP, OT role in therapy, safety and falls.     Person(s) Educated Patient   Methods Explanation;Demonstration;Verbal cues   Comprehension Verbalized understanding;Returned demonstration;Verbal cues required             OT Long Term Goals - 10/14/14 1020    OT LONG TERM GOAL #1   Title Patient will improve R hand grip by 10# to open jars, containers and water bottles.    Time 8   Period Weeks   Status Achieved   OT LONG TERM GOAL #2   Title Patient will improve grip sufficient to cut food with a knife with modified independence   Time 8   Period Weeks   Status Achieved   OT LONG TERM GOAL #3   Title Edema in patients right hand will decrease .5 cm at the wrist and around MPs to demonstrate full ROM for full functional grasp and release of objects.   Time 8   Period Weeks   Status Achieved   OT LONG TERM GOAL #4   Title Patient will improve coordination in right hand to produce a secure tripod grasp on pen and complete handwriting with 90% legibility.    Time 8   Period Weeks   Status On-going   OT LONG TERM GOAL #5   Title Patient will improve grasp  on right by 2# of force to open a bag of chips.   Time 8   Period Weeks   Status Achieved   Long Term Additional Goals   Additional Long Term Goals Yes   OT LONG TERM GOAL #6   Title Patient will complete basic self care with modified independence.     Time 8   Period Weeks   Status Achieved   OT LONG TERM GOAL #7   Title Patient will complete light meal prep with modified independence.   Time 8   Period Weeks   Status Achieved   OT LONG TERM GOAL #8   Title Patient will demonstrate increased strength in right UE to carry groceries from the car to the house with modified independence.   Time 8   Period Weeks   Status New   OT LONG TERM GOAL  #9   Baseline Patient will demonstrate understanding of alternative ways to manage and carry laundry to the laundry room safely.   Time 8   Period Weeks   Status New   OT LONG TERM GOAL  #10   TITLE Patient will  demonstrate the ability to safely pick up a pot of pasta to drain the water and place a full casserole dish in the oven with supervision.   Time 8   Period Weeks   Status New               Plan - 11/19/14 1638    Clinical Impression Statement Patient requires cues for exercises and coordination tasks.  Focused more this week on self care and IADL tasks around the home in addition to home safety and fall recovery.  Patient did have a fall at home this week.  Encouraged patient to perform his maximal daily exercises he learned in his [employer] program and discussed the potential need for a "tune up" with [employer] program especially with recent falls. He continues to benefit from OT to improve independence in daily tasks.   Pt will benefit from skilled therapeutic intervention in order to improve on the following deficits (Retired) Decreased endurance;Decreased strength;Impaired flexibility;Decreased balance;Difficulty walking;Decreased range of motion;Increased edema;Pain;Decreased coordination;Impaired UE functional use   Rehab Potential Good   OT Frequency 2x / week   OT Duration 8 weeks   OT Treatment/Interventions Self-care/ADL training;Therapeutic exercise;Patient/family education;Neuromuscular education;Manual Therapy;DME and/or AE instruction;Passive range of motion;Contrast Bath;Moist Heat   Consulted and Agree with Plan of Care Patient        Problem List There are no active problems to display for this patient.  Achilles Dunk, OTR/L, CLT  Charika Mikelson 11/20/2014, 7:10 PM  [place] MAIN [facility] SERVICES [address] [place], [place], [address] Phone: [phone]   Fax:  [phone]  Name: JIMIE WISELY MRN: [id number] Date of Birth: April 23, 1955

## 2014-11-24 ENCOUNTER — Encounter: Payer: Self-pay | Admitting: Occupational Therapy

## 2014-11-24 ENCOUNTER — Ambulatory Visit: Payer: Federal, State, Local not specified - PPO | Admitting: Occupational Therapy

## 2014-11-24 DIAGNOSIS — Z741 Need for assistance with personal care: Secondary | ICD-10-CM

## 2014-11-24 DIAGNOSIS — R279 Unspecified lack of coordination: Secondary | ICD-10-CM

## 2014-11-24 DIAGNOSIS — M6281 Muscle weakness (generalized): Secondary | ICD-10-CM

## 2014-11-24 DIAGNOSIS — R460 Very low level of personal hygiene: Secondary | ICD-10-CM

## 2014-11-25 NOTE — Therapy (Signed)
[place] MAIN [facility] SERVICES [address] [place], [place], [address] Phone: [phone]   Fax:  [phone]  Occupational Therapy Treatment  Patient Details  Name: Charles Stanton MRN: [id number] Date of Birth: 1955/12/13 No Data Recorded  Encounter Date: 11/24/2014      OT End of Session - 11/24/14 1649    Visit Number 20   Number of Visits 32   Date for OT Re-Evaluation 12/09/14   OT Start Time 1230   OT Stop Time 1315   OT Time Calculation (min) 45 min   Activity Tolerance Patient tolerated treatment well   Behavior During Therapy [facility] for tasks assessed/performed      Past Medical History  Diagnosis Date  . Hypertension   . Parkinson's disease ([place])   . Hyperlipemia     History reviewed. No pertinent past surgical history.  There were no vitals filed for this visit.  Visit Diagnosis:  Muscle weakness  Lack of coordination  Self-care deficit for bathing and hygiene  Self-care deficit for dressing and grooming      Subjective Assessment - 11/24/14 1251    Subjective  Patient reports he was at [facility] this morning, volunteering.  No falls since last weeks incident.  Went to the parade this weekend.     Patient Stated Goals "My goal is to be able to cut a steak".  Patient wants to be able to do more with his right hand, especially with handwriting, cutting food and picking up things."   Currently in Pain? No/denies   Multiple Pain Sites No                      OT Treatments/Exercises (OP) - 11/24/14 1859    Fine Motor Coordination   Other Fine Motor Exercises Right hand coordination with [place] like discs for multiple trials, one hand manipulation, 2 hand simultaneous manipulation and then combined with multi directional reaching.  Manipulation of coins from table top with focus on prehension patterns, translatory movements and using the hand for storage.  Manipulation of safety pins, opening and  closing and placing into two layers of fabric.  Cues for techniques.     Neurological Re-education Exercises   Other Exercises 1 UBE for 5 minutes with setting of 3.0 to 3.6, forwards/backwards with therapist in constant attention to ensure grip and provide instruction and cues.   Other Exercises 2 Resistive putty exercises for right grip, lateral pinch, 3 point pinch, 2 point pinch and bilateral hand use for forming small balls with palms.                     OT Long Term Goals - 10/14/14 1020    OT LONG TERM GOAL #1   Title Patient will improve R hand grip by 10# to open jars, containers and water bottles.    Time 8   Period Weeks   Status Achieved   OT LONG TERM GOAL #2   Title Patient will improve grip sufficient to cut food with a knife with modified independence   Time 8   Period Weeks   Status Achieved   OT LONG TERM GOAL #3   Title Edema in patients right hand will decrease .5 cm at the wrist and around MPs to demonstrate full ROM for full functional grasp and release of objects.   Time 8   Period Weeks   Status Achieved   OT LONG TERM GOAL #4  Title Patient will improve coordination in right hand to produce a secure tripod grasp on pen and complete handwriting with 90% legibility.    Time 8   Period Weeks   Status On-going   OT LONG TERM GOAL #5   Title Patient will improve grasp on right by 2# of force to open a bag of chips.   Time 8   Period Weeks   Status Achieved   Long Term Additional Goals   Additional Long Term Goals Yes   OT LONG TERM GOAL #6   Title Patient will complete basic self care with modified independence.     Time 8   Period Weeks   Status Achieved   OT LONG TERM GOAL #7   Title Patient will complete light meal prep with modified independence.   Time 8   Period Weeks   Status Achieved   OT LONG TERM GOAL #8   Title Patient will demonstrate increased strength in right UE to carry groceries from the car to the house with modified  independence.   Time 8   Period Weeks   Status New   OT LONG TERM GOAL  #9   Baseline Patient will demonstrate understanding of alternative ways to manage and carry laundry to the laundry room safely.   Time 8   Period Weeks   Status New   OT LONG TERM GOAL  #10   TITLE Patient will demonstrate the ability to safely pick up a pot of pasta to drain the water and place a full casserole dish in the oven with supervision.   Time 8   Period Weeks   Status New               Plan - 11/24/14 1650    Clinical Impression Statement Patient with increased focus this date than last session.  No emotional lability today during tasks.  He continues to progress with tasks for right UE strength, coordination and functional use for daily tasks.    Pt will benefit from skilled therapeutic intervention in order to improve on the following deficits (Retired) Decreased endurance;Decreased strength;Impaired flexibility;Decreased balance;Difficulty walking;Decreased range of motion;Increased edema;Pain;Decreased coordination;Impaired UE functional use   Rehab Potential Good   OT Frequency 2x / week   OT Duration 8 weeks   OT Treatment/Interventions Self-care/ADL training;Therapeutic exercise;Patient/family education;Neuromuscular education;Manual Therapy;DME and/or AE instruction;Passive range of motion;Contrast Bath;Moist Heat   Consulted and Agree with Plan of Care Patient        Problem List There are no active problems to display for this patient.  Achilles Dunk, OTR/L, CLT Charles Stanton,Charles Stanton 11/25/2014, 7:05 PM  [place] MAIN [facility] SERVICES [address] [place], [place], [address] Phone: [phone]   Fax:  [phone]  Name: Charles Stanton MRN: [id number] Date of Birth: 01/02/1956

## 2014-11-26 ENCOUNTER — Encounter: Payer: Self-pay | Admitting: Occupational Therapy

## 2014-11-26 ENCOUNTER — Ambulatory Visit: Payer: Federal, State, Local not specified - PPO | Admitting: Occupational Therapy

## 2014-11-26 DIAGNOSIS — M6281 Muscle weakness (generalized): Secondary | ICD-10-CM | POA: Diagnosis not present

## 2014-11-26 DIAGNOSIS — R460 Very low level of personal hygiene: Secondary | ICD-10-CM

## 2014-11-26 DIAGNOSIS — R279 Unspecified lack of coordination: Secondary | ICD-10-CM

## 2014-11-26 DIAGNOSIS — Z741 Need for assistance with personal care: Secondary | ICD-10-CM

## 2014-11-26 NOTE — Therapy (Signed)
[place] MAIN [facility] SERVICES [address] [place], [place], [address] Phone: [phone]   Fax:  [phone]  Occupational Therapy Treatment  Patient Details  Name: Charles Stanton MRN: [id number] Date of Birth: Jul 28, 1955 No Data Recorded  Encounter Date: 11/26/2014      OT End of Session - 11/26/14 1458    Visit Number 21   Number of Visits 32   Date for OT Re-Evaluation 12/09/14   OT Start Time 1402   OT Stop Time 1445   OT Time Calculation (min) 43 min   Activity Tolerance Patient tolerated treatment well   Behavior During Therapy [facility] for tasks assessed/performed      Past Medical History  Diagnosis Date  . Hypertension   . Parkinson's disease ([place])   . Hyperlipemia     History reviewed. No pertinent past surgical history.  There were no vitals filed for this visit.  Visit Diagnosis:  Muscle weakness  Lack of coordination  Self-care deficit for bathing and hygiene  Self-care deficit for dressing and grooming      Subjective Assessment - 11/26/14 1457    Subjective  Patient reports he will be spending Thanksgiving with family tomorrow.  Will try to help his wife with the cooking.  Really likes the oven stick and has been using this week.    Patient Stated Goals "My goal is to be able to cut a steak".  Patient wants to be able to do more with his right hand, especially with handwriting, cutting food and picking up things."   Currently in Pain? No/denies   Multiple Pain Sites No                      OT Treatments/Exercises (OP) - 11/26/14 1554    ADLs   ADL Comments Patient instructed on adaptive equipment use for kitchen tasks and provided information on door handle for car to help with transfers in and out of the car.  Issued information for purchasing locally,.   Fine Motor Coordination   Other Fine Motor Exercises Patient seen for right hand coordination tasks with [place] pegboard for manipulation of  small objects, assembly with small dowel, collar and washer.     Neurological Re-education Exercises   Other Exercises 1 Dowel exercises for strengthening with 2# weight for shoulder flexion, ABD/ADD, forwards circles, backwards circles, chest press, elbow flexion/ext for 2 sets of 15 reps each.  Red theraband exercises for shoulder ABD, diagonal patterns for 2 sets of 10 reps each.  Verbal cues provided for technique.                OT Education - 11/26/14 1457    Education provided Yes   Education Details adaptive equipment, HEP   Person(s) Educated Patient   Methods Explanation;Demonstration;Verbal cues   Comprehension Verbal cues required;Returned demonstration;Verbalized understanding             OT Long Term Goals - 10/14/14 1020    OT LONG TERM GOAL #1   Title Patient will improve R hand grip by 10# to open jars, containers and water bottles.    Time 8   Period Weeks   Status Achieved   OT LONG TERM GOAL #2   Title Patient will improve grip sufficient to cut food with a knife with modified independence   Time 8   Period Weeks   Status Achieved   OT LONG TERM GOAL #3   Title Edema in  patients right hand will decrease .5 cm at the wrist and around MPs to demonstrate full ROM for full functional grasp and release of objects.   Time 8   Period Weeks   Status Achieved   OT LONG TERM GOAL #4   Title Patient will improve coordination in right hand to produce a secure tripod grasp on pen and complete handwriting with 90% legibility.    Time 8   Period Weeks   Status On-going   OT LONG TERM GOAL #5   Title Patient will improve grasp on right by 2# of force to open a bag of chips.   Time 8   Period Weeks   Status Achieved   Long Term Additional Goals   Additional Long Term Goals Yes   OT LONG TERM GOAL #6   Title Patient will complete basic self care with modified independence.     Time 8   Period Weeks   Status Achieved   OT LONG TERM GOAL #7   Title  Patient will complete light meal prep with modified independence.   Time 8   Period Weeks   Status Achieved   OT LONG TERM GOAL #8   Title Patient will demonstrate increased strength in right UE to carry groceries from the car to the house with modified independence.   Time 8   Period Weeks   Status New   OT LONG TERM GOAL  #9   Baseline Patient will demonstrate understanding of alternative ways to manage and carry laundry to the laundry room safely.   Time 8   Period Weeks   Status New   OT LONG TERM GOAL  #10   TITLE Patient will demonstrate the ability to safely pick up a pot of pasta to drain the water and place a full casserole dish in the oven with supervision.   Time 8   Period Weeks   Status New               Plan - 11/26/14 1458    Clinical Impression Statement Patient continues to progress, fine motor coordination has improved with manipulation of small objects with right hand with improved quality of movement, decreased dropping of items and able to demonstrate higher level translatory movements of the hand. Has benefitted from adaptive equipment training as well as alternative ways to perform daily tasks.   Pt will benefit from skilled therapeutic intervention in order to improve on the following deficits (Retired) Decreased endurance;Decreased strength;Impaired flexibility;Decreased balance;Difficulty walking;Decreased range of motion;Increased edema;Pain;Decreased coordination;Impaired UE functional use   Rehab Potential Good   OT Frequency 2x / week   OT Duration 8 weeks   OT Treatment/Interventions Self-care/ADL training;Therapeutic exercise;Patient/family education;Neuromuscular education;Manual Therapy;DME and/or AE instruction;Passive range of motion;Contrast Bath;Moist Heat   Consulted and Agree with Plan of Care Patient        Problem List There are no active problems to display for this patient.  Achilles Dunk, OTR/L, CLT Lovett,Amy 11/26/2014, 4:01  PM  [place] MAIN [facility] SERVICES [address] [place], [place], [address] Phone: [phone]   Fax:  [phone]  Name: Charles Stanton MRN: [id number] Date of Birth: 1955/11/29

## 2014-12-01 ENCOUNTER — Encounter: Payer: Self-pay | Admitting: Occupational Therapy

## 2014-12-01 ENCOUNTER — Ambulatory Visit: Payer: Federal, State, Local not specified - PPO | Admitting: Occupational Therapy

## 2014-12-01 DIAGNOSIS — M6281 Muscle weakness (generalized): Secondary | ICD-10-CM | POA: Diagnosis not present

## 2014-12-01 DIAGNOSIS — R279 Unspecified lack of coordination: Secondary | ICD-10-CM

## 2014-12-01 DIAGNOSIS — Z741 Need for assistance with personal care: Secondary | ICD-10-CM

## 2014-12-01 DIAGNOSIS — R460 Very low level of personal hygiene: Secondary | ICD-10-CM

## 2014-12-01 NOTE — Therapy (Signed)
[place] MAIN [facility] SERVICES [address] [place], [place], [address] Phone: [phone]   Fax:  [phone]  Occupational Therapy Treatment  Patient Details  Name: Charles Stanton MRN: [id number] Date of Birth: 03/24/1955 No Data Recorded  Encounter Date: 12/01/2014      OT End of Session - 12/01/14 1627    Visit Number 22   Number of Visits 32   Date for OT Re-Evaluation 12/09/14   OT Start Time 1344   OT Stop Time 1429   OT Time Calculation (min) 45 min   Activity Tolerance Patient tolerated treatment well   Behavior During Therapy [facility] for tasks assessed/performed      Past Medical History  Diagnosis Date  . Hypertension   . Parkinson's disease ([place])   . Hyperlipemia     History reviewed. No pertinent past surgical history.  There were no vitals filed for this visit.  Visit Diagnosis:  Muscle weakness  Lack of coordination  Self-care deficit for bathing and hygiene  Self-care deficit for dressing and grooming      Subjective Assessment - 12/01/14 1625    Subjective  Patient reports he had a good Thanksgiving and actually ate more than he has been.  States, "my son in law Timmy said it was the most he had seen me eat in a while!"  Patient talking about his appetite being poor lately.   Patient Stated Goals "My goal is to be able to cut a steak".  Patient wants to be able to do more with his right hand, especially with handwriting, cutting food and picking up things."   Currently in Pain? No/denies   Multiple Pain Sites No                      OT Treatments/Exercises (OP) - 12/01/14 1632    ADLs   Writing Handwriting with focus on formation of larger scale letter formation to complete name, address and phone number.     Fine Motor Coordination   Other Fine Motor Exercises Patient seen for manipulation of 1/2 inch dowel and washers with multi directional reach in sitting.   Neurological Re-education  Exercises   Other Exercises 1  Supination/pronation with 1# hammer for 10 reps on right.  WEighted ball toss in standing wtih 2000 g ball for 20 reps then 5000 g ball for 10 reps.   Other Exercises 2 Multi directional reach and strengthening with 2.5# wrist weight and use of saebo tower balls and loops.                 OT Education - 12/01/14 1626    Education provided Yes   Education Details HEP, safety with tasks around the home   Person(s) Educated Patient   Methods Demonstration;Verbal cues   Comprehension Verbal cues required;Returned demonstration;Verbalized understanding             OT Long Term Goals - 10/14/14 1020    OT LONG TERM GOAL #1   Title Patient will improve R hand grip by 10# to open jars, containers and water bottles.    Time 8   Period Weeks   Status Achieved   OT LONG TERM GOAL #2   Title Patient will improve grip sufficient to cut food with a knife with modified independence   Time 8   Period Weeks   Status Achieved   OT LONG TERM GOAL #3   Title Edema in patients right hand  will decrease .5 cm at the wrist and around MPs to demonstrate full ROM for full functional grasp and release of objects.   Time 8   Period Weeks   Status Achieved   OT LONG TERM GOAL #4   Title Patient will improve coordination in right hand to produce a secure tripod grasp on pen and complete handwriting with 90% legibility.    Time 8   Period Weeks   Status On-going   OT LONG TERM GOAL #5   Title Patient will improve grasp on right by 2# of force to open a bag of chips.   Time 8   Period Weeks   Status Achieved   Long Term Additional Goals   Additional Long Term Goals Yes   OT LONG TERM GOAL #6   Title Patient will complete basic self care with modified independence.     Time 8   Period Weeks   Status Achieved   OT LONG TERM GOAL #7   Title Patient will complete light meal prep with modified independence.   Time 8   Period Weeks   Status Achieved   OT  LONG TERM GOAL #8   Title Patient will demonstrate increased strength in right UE to carry groceries from the car to the house with modified independence.   Time 8   Period Weeks   Status New   OT LONG TERM GOAL  #9   Baseline Patient will demonstrate understanding of alternative ways to manage and carry laundry to the laundry room safely.   Time 8   Period Weeks   Status New   OT LONG TERM GOAL  #10   TITLE Patient will demonstrate the ability to safely pick up a pot of pasta to drain the water and place a full casserole dish in the oven with supervision.   Time 8   Period Weeks   Status New               Plan - 12/01/14 1627    Clinical Impression Statement Patient denies any falls this past week.  Continues to demonstrate improvements with speed and dexterity of the right hand to complete tasks in the clinic and at home.  Strength improving and patient is able to demo exercises for UE strengthening for home program.  Continue to add to list of exercises for home program.     Pt will benefit from skilled therapeutic intervention in order to improve on the following deficits (Retired) Decreased endurance;Decreased strength;Impaired flexibility;Decreased balance;Difficulty walking;Decreased range of motion;Increased edema;Pain;Decreased coordination;Impaired UE functional use   Rehab Potential Good   OT Frequency 2x / week   OT Duration 8 weeks   OT Treatment/Interventions Self-care/ADL training;Therapeutic exercise;Patient/family education;Neuromuscular education;Manual Therapy;DME and/or AE instruction;Passive range of motion;Contrast Bath;Moist Heat   Consulted and Agree with Plan of Care Patient        Problem List There are no active problems to display for this patient.  Achilles Dunk, OTR/L, CLT Charles Stanton 12/01/2014, 4:41 PM  [place] MAIN [facility] SERVICES [address] [place], [place], [address] Phone: [phone]   Fax:   [phone]  Name: Charles Stanton MRN: [id number] Date of Birth: January 20, 1955

## 2014-12-03 ENCOUNTER — Ambulatory Visit: Payer: Federal, State, Local not specified - PPO | Admitting: Occupational Therapy

## 2014-12-09 ENCOUNTER — Ambulatory Visit: Payer: Federal, State, Local not specified - PPO | Attending: Neurology | Admitting: Occupational Therapy

## 2014-12-09 ENCOUNTER — Encounter: Payer: Self-pay | Admitting: Occupational Therapy

## 2014-12-09 DIAGNOSIS — R279 Unspecified lack of coordination: Secondary | ICD-10-CM | POA: Diagnosis present

## 2014-12-09 DIAGNOSIS — M6281 Muscle weakness (generalized): Secondary | ICD-10-CM | POA: Insufficient documentation

## 2014-12-09 DIAGNOSIS — R460 Very low level of personal hygiene: Secondary | ICD-10-CM | POA: Diagnosis present

## 2014-12-09 DIAGNOSIS — Z741 Need for assistance with personal care: Secondary | ICD-10-CM

## 2014-12-10 NOTE — Therapy (Signed)
[place] MAIN [facility] SERVICES [address] [place], [place], [address] Phone: [phone]   Fax:  [phone]  Occupational Therapy Treatment/Discharge Summary   Patient seen from 08/18/2014 to 12/09/2014  Patient Details  Name: Charles ABDELAZIZ MRN: [id number] Date of Birth: 17-Mar-1955 No Data Recorded  Encounter Date: 12/09/2014      OT End of Session - 12/09/14 1528    Visit Number 23   Number of Visits 32   Date for OT Re-Evaluation 12/09/14   OT Start Time 0913   OT Stop Time 1000   OT Time Calculation (min) 47 min   Activity Tolerance Patient tolerated treatment well   Behavior During Therapy [facility] for tasks assessed/performed      Past Medical History  Diagnosis Date  . Hypertension   . Parkinson's disease ([place])   . Hyperlipemia     History reviewed. No pertinent past surgical history.  There were no vitals filed for this visit.  Visit Diagnosis:  Muscle weakness  Lack of coordination  Self-care deficit for bathing and hygiene  Self-care deficit for dressing and grooming      Subjective Assessment - 12/09/14 0955    Subjective  Patient states, "I brought doughnuts today since it is my last day."  Patient indicating he is sad for this to be his last day of therapy but feels he has made excellent progress.     Patient Stated Goals "My goal is to be able to cut a steak".  Patient wants to be able to do more with his right hand, especially with handwriting, cutting food and picking up things."   Currently in Pain? --   Multiple Pain Sites --                      OT Treatments/Exercises (OP) - 12/10/14 1522    ADLs   ADL Comments Reassessment of ADL tasks and goals   Fine Motor Coordination   Other Fine Motor Exercises 9 hole peg test right 27 secs, left 23 secs.   Other Fine Motor Exercises Patient instructed and demonstrated home exercise program for fine motor coordination skills.    Neurological  Re-education Exercises   Other Exercises 1 Patient seen for reassessment this date as follows:  right grip strength 54# (up from 28# at eval), left grip 71# (up from 58# at eval).  Right lateral pinch 15#, 3 point pinch 17#, 2 point pinch 10#.  Left pinch lateral 17#, 3 point 19#, 2 point 13#.  Right shoulder active ROM for shoulder flexion 150 degrees (up from 120 degrees at evaluation.  full elbow flexion/ext, wrist extension improved to 70 degrees on right.     Other Exercises 2 Patient seen for instruction of HEP for strengthening and ROM skills and able to demonstrate understanding.                OT Education - 12/09/14 1527    Education provided Yes   Education Details HEP, potential need for re enrollment of LSVT BIG program, self care tasks.   Person(s) Educated Patient   Methods Explanation;Demonstration;Verbal cues   Comprehension Returned demonstration;Verbalized understanding             OT Long Term Goals - 12/09/14 1532    OT LONG TERM GOAL #1   Title Patient will improve R hand grip by 10# to open jars, containers and water bottles.    Time 8   Period Weeks  Status Achieved   OT LONG TERM GOAL #2   Title Patient will improve grip sufficient to cut food with a knife with modified independence   Time 8   Period Weeks   Status Achieved   OT LONG TERM GOAL #3   Title Edema in patients right hand will decrease .5 cm at the wrist and around MPs to demonstrate full ROM for full functional grasp and release of objects.   Baseline increased edema in right hand and wrist at evaluation, by 1cm in wrist and MPs   Time 8   Period Weeks   Status Achieved   OT LONG TERM GOAL #4   Title Patient will improve coordination in right hand to produce a secure tripod grasp on pen and complete handwriting with 90% legibility.    Time 8   Period Weeks   Status Partially Met   OT LONG TERM GOAL #5   Title Patient will improve grasp on right by 2# of force to open a bag of  chips.   Time 8   Period Weeks   Status Achieved   OT LONG TERM GOAL #6   Title Patient will complete basic self care with modified independence.     Time 8   Period Weeks   Status Achieved   OT LONG TERM GOAL #7   Title Patient will complete light meal prep with modified independence.   Time 8   Period Weeks   Status Achieved   OT LONG TERM GOAL #8   Title Patient will demonstrate increased strength in right UE to carry groceries from the car to the house with modified independence.   Time 8   Period Weeks   Status Achieved   OT LONG TERM GOAL  #9   Baseline Patient will demonstrate understanding of alternative ways to manage and carry laundry to the laundry room safely.   Time 8   Period Weeks   Status Achieved   OT LONG TERM GOAL  #10   TITLE Patient will demonstrate the ability to safely pick up a pot of pasta to drain the water and place a full casserole dish in the oven with supervision.   Time 8   Period Weeks   Status Achieved               Plan - 12/09/14 1528    Clinical Impression Statement Patient has made significant progress in the areas of UE strength, ROM, coordination and ADL/IADL skills since starting therapy.  He has met his goals and prepared for discharge from OT services at this time.  Recommend he consider LSVT BIG program to address falls, balance and gait to improve independence and safety.  Patient is able to demonstrate understanding of home exercise program and has been instructed on adaptive equipment and modifications for home.     Pt will benefit from skilled therapeutic intervention in order to improve on the following deficits (Retired) Decreased endurance;Decreased strength;Impaired flexibility;Decreased balance;Difficulty walking;Decreased range of motion;Increased edema;Pain;Decreased coordination;Impaired UE functional use   Rehab Potential Good   OT Frequency 2x / week   OT Duration 8 weeks   OT Treatment/Interventions Self-care/ADL  training;Therapeutic exercise;Patient/family education;Neuromuscular education;Manual Therapy;DME and/or AE instruction;Passive range of motion;Contrast Bath;Moist Heat   Recommended Other Services LSVT BIG program   Consulted and Agree with Plan of Care Patient        Problem List There are no active problems to display for this patient.  Amy T Lovett, OTR/L, CLT Lovett,Amy  12/10/2014, 3:36 PM  [place] MAIN [facility] SERVICES [address] [place], [place], [address] Phone: [phone]   Fax:  [phone]  Name: Charles Stanton MRN: [id number] Date of Birth: Nov 26, 1955

## 2015-01-04 DIAGNOSIS — C801 Malignant (primary) neoplasm, unspecified: Secondary | ICD-10-CM

## 2015-01-04 HISTORY — DX: Malignant (primary) neoplasm, unspecified: C80.1

## 2015-04-14 ENCOUNTER — Ambulatory Visit (INDEPENDENT_AMBULATORY_CARE_PROVIDER_SITE_OTHER): Payer: Federal, State, Local not specified - PPO | Admitting: Licensed Clinical Social Worker

## 2015-04-14 ENCOUNTER — Encounter: Payer: Self-pay | Admitting: Licensed Clinical Social Worker

## 2015-04-14 DIAGNOSIS — F331 Major depressive disorder, recurrent, moderate: Secondary | ICD-10-CM | POA: Insufficient documentation
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
## 2015-04-20 ENCOUNTER — Ambulatory Visit (INDEPENDENT_AMBULATORY_CARE_PROVIDER_SITE_OTHER): Payer: Federal, State, Local not specified - PPO | Admitting: Licensed Clinical Social Worker

## 2015-04-20 DIAGNOSIS — F331 Major depressive disorder, recurrent, moderate: Secondary | ICD-10-CM | POA: Diagnosis not present

## 2015-04-20 NOTE — Progress Notes (Signed)
   THERAPIST PROGRESS NOTE  Session Time: 2:05 PM-2:55 PM  Participation Level: Active  Behavioral Response: CasualAlertEuphoric and tearful at times  Type of Therapy: Individual Therapy  Treatment Goals addressed: Coping and Diagnosis: MDD  Interventions: Strength-based, Supportive and Reframing  Summary: Charles Stanton is a 60 y.o. male who presents relating that he has spending time with the grandchildren. He said it has been pretty good but there are days when he is disappointed and down on himself. He wants to be the way things used to be. He does better when he is with his wife. He said that he loves her to death. She sure that he takes his medicine. Patient said that he has himself and his dog. His dog gives him attention. Patient said that he also likes going to the beach and he has a place there. Patient recognizes that he has to come to acceptance because it isn't going to change. He has a good support group.Patient discussed doing volunteer work and therapist encouraged him that would help create positive emotion. Patient said that to a certain extent that he can't control his health. Therapist pointed out that he has to focus on what he can control. Therapist said to focus on ways he can create positive experiences in his life and things he can control.  He feels he will be a burden and therapist pointed out that he is anticipating a negative that isn't even here yet. He makes people laugh and therapist pointed out that this is good medicine and to use his strenghts. Reviewed session and patient realizes that he can't be "all gloom and doom" and not focus on the things he can't control but on the things he can such as making people people laugh  and volunteer life.  Suicidal/Homicidal: No  Therapist Response: Therapist pointed out that he has to work on acceptance as this will help him cope better. Therapist said it helps to focus on what he does have. Therapist said thinking about other  people will help him get out of himself and and creates positive emotion by thinking about someone else. Therapist pointed out patients strengths such as making people laugh that he has to use for coping. Therapist reinforced patients insights to help him cope as well as reframing such as realizing that despite health problems he still has himself with his strengths and personality. Therapist also completed treatment plan with patient which is to help patient have a positive attitude about his disease and to maintain a positive attitude.   Plan: Return again in [place].2.Patient will look into volunteer work  Diagnosis: Axis I: Major Depression, Recurrent moderate    Axis II: none    Bowman,Mary A 04/20/2015

## 2015-04-23 ENCOUNTER — Ambulatory Visit: Payer: Federal, State, Local not specified - PPO | Admitting: Licensed Clinical Social Worker

## 2015-04-27 ENCOUNTER — Ambulatory Visit (INDEPENDENT_AMBULATORY_CARE_PROVIDER_SITE_OTHER): Payer: Federal, State, Local not specified - PPO | Admitting: Licensed Clinical Social Worker

## 2015-04-27 DIAGNOSIS — F331 Major depressive disorder, recurrent, moderate: Secondary | ICD-10-CM | POA: Diagnosis not present

## 2015-04-27 NOTE — Progress Notes (Signed)
   THERAPIST PROGRESS NOTE  Session Time: 1:05 PM-11:52 PM  Participation Level: Active  Behavioral Response: CasualAlertDysphoric  Type of Therapy: Individual Therapy  Treatment Goals addressed: Coping and Diagnosis: MDD, recurrent moderate, work on a positive attitude about his disease and the rest of his life.   Interventions: DBT, Solution Focused, Strength-based, Supportive and Reframing, education about ego resources  Summary: Charles Stanton is a 60 y.o. male who presents with having gone to a wine festival and got sick. It could be medicine or anxiety. He doesn't like being around a lot of people. He got sick this morning. He does not want to go to a tasting festival. Discussed with patient eating something he is able to eat and think about how much this will mean to his wife.  He said he trouble eating and this can get him sick. Therapist reviewed with patient things he is able to eat where he won't get sick. It has been a good week. He is going to get a new carpet, new couch and painted the fireplace. He stayed busy looking at carpet samples and paint samples. He exercises most days. Felt good by helping others. Discussed finding ways to help him want to wake up in the morning. Patient will look into volunteer work. Discussed coping thoughts that help him not to think about the negative. He is going to see his son's wedding. Her daughter is going to have a baby. Therapist questioned him about things he can do to help with Parkinson's. Therapist pointed out that he has a beach house. Patient realizes his thoughts go to gloom or doom and has to find positive thoughts. He said that he realizes that he has to realize that things aren't that bad.   Suicidal/Homicidal: No  Therapist Response: Therapist discussed an emotional regulation skill from DBT and discussed how one's emotions can influence your thoughts and behaviors and also how your emotions can be affected by your thoughts and  behaviors. Therapist helped patient to explore positive coping thoughts. Therapist reviewed activities that increase positive mood and discussed how thinking about other can increase positive mood. Therapist encouraged patient to research ways for him to help with Parkinson's. Therapist discussed ego resources and pointed patient's strengths in having sense of humor. Therapist explained that ego strengths help managing emotions. Therapist provided supportive and strength based interventions.   Plan: Return again in 1 weeks.2.Patient look into volunteer work.3.Patient look into strategies that will help with Parkinson's disease.4.Patient work on looking at things from a more positive perspective.   Diagnosis: Axis I: Major Depression, Recurrent moderate    Axis II: n/a    Anjelique Makar A 04/27/2015

## 2015-05-05 ENCOUNTER — Telehealth: Payer: Self-pay

## 2015-05-05 ENCOUNTER — Ambulatory Visit (INDEPENDENT_AMBULATORY_CARE_PROVIDER_SITE_OTHER): Payer: Federal, State, Local not specified - PPO | Admitting: Licensed Clinical Social Worker

## 2015-05-05 DIAGNOSIS — F331 Major depressive disorder, recurrent, moderate: Secondary | ICD-10-CM | POA: Diagnosis not present

## 2015-05-05 NOTE — Progress Notes (Signed)
THERAPIST PROGRESS NOTE  Session Time: 3:00 PM-3:50 PM  Participation Level: Active  Behavioral Response: CasualAlertEuthymic  Type of Therapy: Individual Therapy  Treatment Goals addressed: Coping and Diagnosis: Major Depressive Disorder, recurrent, moderate, Work on a positive attitude about his disease and for the rest of his life  Interventions: CBT, Supportive and Reframing  Summary: Charles Stanton is a 60 y.o. male who presents with saying that neurologist said he needs to stop taking the Citalopram 40 mg due to problems with heart.  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 He threw up and felt sick to his stomach but wife pointed out that he can do more than he thinks he can. He felt good that he went with his wife to dinner. He didn't want to go but he felt good about it. When he got there he had the shakes bad and she hugged him and said that it was going to be all right. She wasn't embarrassed but he was. He was tearful relating the story. He had a good week. He had the best week of eating and doing positive things. He called the Scientist, research (physical sciences) to start doing volunteer work and she said to take care of himself first. Patient said he needs to get out of the house and thinks volunteer work would be good. The exercise class is something he enjoys. He does it a least every other day. He goes to Parkinson support group and he loves them. He sees what he will be like in future, however, and that is discouraging Shared that he has to realize that they are not him. They are encouraging in that they encourage him to exercise, keep busy and are inspiring. They are good because they have experienced what he is going through. They can give him good input. The disease can bring one down and related that he realizes that he has to do what he needs to slow down the progression of the disease. He needs  to exercise and use his mind. They are helpful because they have experienced what he is going through and give him good input. Patient shared that he feels people are looking at him because of symptoms of Parkinson's disease and he feels self-conscious. He had a good week. He is more positive. "It isn't be all bad, it is what it is and I have to work through it".   Suicidal/Homicidal: No  Therapist Response: Therapist pointed out the supports patient has available and that his wife points him in the right direction. Therapist encouraged patient to continue to look into volunteer work as it will help him have purpose. Therapist pointed out that there is a lot to enjoy. Therapist explained that doing things to stay bush helps to normalize concerns that people are looking at him. Therapist helped patient to understand that he has to choice of how to live his life and not to live for others but for himself in order for him not to worry about how others perceived his symptoms of Parkinson.Therapist encouraged patient to continue to look at the positives in his life. Therapist encouraged positive steps he his taking and reinforced effective coping such as staying busy and exercise. Therapist utilized strength based approaches. Therapist touched base with CNA about patient's medications.   Plan: Return again in 1 week.2.Patient engage in positive activities to help with mood.  Diagnosis: Axis  I: Major Depression, Recurrent moderate    Axis II: n/a    Bowman,Mary A, LCSW 05/05/2015

## 2015-05-05 NOTE — Telephone Encounter (Signed)
pt seen Charles Stanton today and he told her that dr. Brigitte Pulse (neurologist) told him to stop the citalopram  40mg  because it would damage his heart.  pt has appt tomorrow 05-06-15

## 2015-05-06 ENCOUNTER — Encounter: Payer: Self-pay | Admitting: Psychiatry

## 2015-05-06 ENCOUNTER — Other Ambulatory Visit: Payer: Self-pay

## 2015-05-06 ENCOUNTER — Ambulatory Visit (INDEPENDENT_AMBULATORY_CARE_PROVIDER_SITE_OTHER): Payer: Federal, State, Local not specified - PPO | Admitting: Psychiatry

## 2015-05-06 VITALS — BP 122/86 | HR 56 | Temp 98.3°F | Ht 65.5 in | Wt 160.4 lb

## 2015-05-06 DIAGNOSIS — F39 Unspecified mood [affective] disorder: Secondary | ICD-10-CM

## 2015-05-06 MED ORDER — TRAZODONE HCL 100 MG PO TABS: 100.0000 mg | ORAL_TABLET | Freq: Every day | ORAL | Status: DC

## 2015-05-06 MED ORDER — SERTRALINE HCL 100 MG PO TABS: 50.0000 mg | ORAL_TABLET | Freq: Every day | ORAL | Status: DC

## 2015-05-06 NOTE — Progress Notes (Signed)
Psychiatric Initial Adult Assessment   Patient Identification: Charles Stanton MRN:  [id number] Date of Evaluation:  05/06/2015 Referral Source: [name]  Chief Complaint:   Chief Complaint    Establish Care; Depression; Anxiety; Stress     Visit Diagnosis:    ICD-9-CM ICD-10-CM   1. Episodic mood disorder ([place]) 296.90 F39     History of Present Illness:    Patient is a 60 year old male who presented for initial assessment. He has been following [name] for therapy sessions. Patient reported that he has a diagnosis of Parkinson's disease and he feels like a burden for his wife. He reported that he has been diagnosed almost one year ago by Dr. Manuella Ghazi neurologist. He is also attending support groups and doing research on his condition. He was tearful during the interview. He reported that he does not want to live a life like a burden on his wife as his mother has history of dementia and he has seen her living this life. He reported that he is currently taking his medications and has been doing research and attending support therapy. Patient reported that sometimes he starts having tremors especially in the public and it will make him and barriers. However his wife will try to encourage him and will tell him that nobody is looking at him. Patient reported that he was feeling depressed and was started on site kilograms 40 mg daily for his depressive symptoms. He feels that the medication is not helpful. He has been drinking on a regular basis. He also reported that he feels that medical marijuana with more helpful as he has done some research about the same. However he reported that he will not go out and seek the same. He reported that he enjoys walking and going out. He is a very social person and is the life of the party.  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 He spends his time at home. His wife works in [employer].  Associated  Signs/Symptoms: Depression Symptoms:  depressed mood, anhedonia, insomnia, psychomotor retardation, fatigue, feelings of worthlessness/guilt, difficulty concentrating, impaired memory, anxiety, disturbed sleep, (Hypo) Manic Symptoms:  Irritable Mood, Labiality of Mood, Anxiety Symptoms:  Excessive Worry, Psychotic Symptoms:  Hallucinations: Visual PTSD Symptoms: Negative NA  Past Psychiatric History: Patient reported that he has never seen a psychiatrist in the past. He denied any history of suicide attempts. He denied any history of psychiatric hospitalization.   Previous Psychotropic Medications:  Celexa Trazodone  Substance Abuse History in the last 12 months:  Yes.   Drinking alcohol on a daily basis  Consequences of Substance Abuse: Negative NA  Past Medical History:  Past Medical History  Diagnosis Date  . Hypertension   . Parkinson's disease ([place])   . Hyperlipemia    History reviewed. No pertinent past surgical history.  Family Psychiatric History:  Mother- Dementia Father- anxiety   Family History:  Family History  Problem Relation Age of Onset  . Heart Problems Mother   . Dementia Mother     Social History:   Social History   Social History  . Marital Status: Married    Spouse Name: N/A  . Number of Children: N/A  . Years of Education: N/A   Social History Main Topics  . Smoking status: Current Some Day Smoker  . Smokeless tobacco: Never Used  . Alcohol Use: Yes  . Drug Use: No  . Sexual Activity: Yes  Other Topics Concern  . 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    Allergies:   Allergies  Allergen Reactions  . Amantadine Other (See Comments)    fatigue sleepy    Metabolic Disorder Labs: No results found for: HGBA1C, MPG No  results found for: PROLACTIN No results found for: CHOL, TRIG, HDL, CHOLHDL, VLDL, LDLCALC   Current Medications: Current Outpatient Prescriptions  Medication Sig Dispense Refill  . ALPRAZolam (XANAX) 0.5 MG tablet Take 0.5 mg by mouth at bedtime as needed for anxiety. Reported on 04/14/2015    . carbidopa-levodopa (SINEMET IR) 25-100 MG per tablet Take 1 tablet by mouth 3 (three) times daily.    . citalopram (CELEXA) 40 MG tablet Take 40 mg by mouth daily.    . hydrochlorothiazide (HYDRODIURIL) 25 MG tablet Take 25 mg by mouth daily.    Marland Kitchen lisinopril (PRINIVIL,ZESTRIL) 20 MG tablet Take 20 mg by mouth 2 (two) times daily.    Marland Kitchen omeprazole (PRILOSEC) 40 MG capsule Take 40 mg by mouth 2 (two) times daily. Reported on 04/14/2015    . simvastatin (ZOCOR) 20 MG tablet Take 20 mg by mouth daily. Reported on 04/14/2015    . traZODone (DESYREL) 100 MG tablet Take 100 mg by mouth.    . Vitamin D, Ergocalciferol, (DRISDOL) 50000 UNITS CAPS capsule Take 50,000 Units by mouth every 7 (seven) days.     No current facility-administered medications for this visit.    Neurologic: Headache: No Seizure: No Paresthesias:No  Musculoskeletal: Strength & Muscle Tone: within normal limits Gait & Station: normal Patient leans: N/A  Psychiatric Specialty Exam: Review of Systems  Neurological: Positive for tremors and focal weakness.  Psychiatric/Behavioral: Positive for depression and substance abuse. The patient is nervous/anxious and has insomnia.   All other systems reviewed and are negative.   Blood pressure 122/86, pulse 56, temperature 98.3 F (36.8 C), temperature source Tympanic, height 5' 5.5" (1.664 m), weight 160 lb 6.4 oz (72.757 kg), SpO2 96 %.Body mass index is 26.28 kg/(m^2).  General Appearance: Casual  Eye Contact:  Fair  Speech:  Clear and Coherent  Volume:  Decreased  Mood:  Anxious and Depressed  Affect:  Congruent and Depressed  Thought Process:  Coherent  Orientation:  Full  (Time, Place, and Person)  Thought Content:  WDL  Suicidal Thoughts:  No  Homicidal Thoughts:  No  Memory:  Immediate;   Fair  Judgement:  Fair  Insight:  Fair  Psychomotor Activity:  Normal  Concentration:  Fair  Recall:  [employer] of Knowledge:Fair  Language: Fair  Akathisia:  No  Handed:  Right  AIMS (if indicated):    Assets:  Communication Skills Desire for Improvement Social Support  ADL's:  Intact  Cognition: WNL  Sleep:      Treatment Plan Summary: Medication management   Discussed with patient at length about his medications treatment risks benefits He also brought a letter from his insurance company discussing about the potential adverse reaction between Celexa And trazodone leading to increased QT interval I will discontinue Celexa Start him on Zoloft 50 mg daily for 1 week and titrate the dose 100 mg daily He will continue on trazodone as prescribed Follow-up in 1 month Advised him to call if he notices  worsening of his symptoms and he demonstrated understanding   More than 50% of the time spent in psychoeducation, counseling and coordination of care.    This note was generated in part or whole with voice recognition software. Voice regonition is usually quite accurate but there are transcription errors that can and very often do occur. I apologize for any typographical errors that were not detected and corrected.   Rainey Pines, MD 5/3/20173:05 PM

## 2015-05-06 NOTE — Telephone Encounter (Signed)
received two faxes requesting 90 day supply on medications.  sertraline 100mg  and on trazodone 100mg .   Pt was seen today please resend rx with 90 day supply

## 2015-05-11 ENCOUNTER — Ambulatory Visit: Payer: No Typology Code available for payment source | Admitting: Licensed Clinical Social Worker

## 2015-06-04 ENCOUNTER — Encounter: Payer: Self-pay | Admitting: Psychiatry

## 2015-06-04 ENCOUNTER — Ambulatory Visit (INDEPENDENT_AMBULATORY_CARE_PROVIDER_SITE_OTHER): Payer: Federal, State, Local not specified - PPO | Admitting: Psychiatry

## 2015-06-04 ENCOUNTER — Other Ambulatory Visit: Payer: Self-pay | Admitting: Psychiatry

## 2015-06-04 VITALS — BP 122/86 | HR 101 | Temp 97.7°F | Ht 65.5 in | Wt 157.0 lb

## 2015-06-04 DIAGNOSIS — F39 Unspecified mood [affective] disorder: Secondary | ICD-10-CM | POA: Diagnosis not present

## 2015-06-04 DIAGNOSIS — G20A1 Parkinson's disease without dyskinesia, without mention of fluctuations: Secondary | ICD-10-CM

## 2015-06-04 DIAGNOSIS — G20 Parkinson's disease: Secondary | ICD-10-CM

## 2015-06-04 MED ORDER — SERTRALINE HCL 50 MG PO TABS: 50.0000 mg | ORAL_TABLET | Freq: Every day | ORAL | Status: DC

## 2015-06-04 MED ORDER — TRAZODONE HCL 100 MG PO TABS: 100.0000 mg | ORAL_TABLET | Freq: Every day | ORAL | Status: DC

## 2015-06-04 NOTE — Progress Notes (Signed)
Psychiatric MD Progress Note   Patient Identification: Charles Stanton MRN:  [id number] Date of Evaluation:  06/04/2015 Referral Source: [name]  Chief Complaint:   Chief Complaint    Follow-up; Medication Refill     Visit Diagnosis:    ICD-9-CM ICD-10-CM   1. Episodic mood disorder ([place]) 296.90 F39   2. Parkinson disease ([place]) 332.0 G20     History of Present Illness:    Patient is a 60 year old male who presented for Follow-up. He has history of Parkinson's disease and presented for the follow-up appointment. Patient walks with a little limp due to his Parkinson's disease. He also has history of depression. Patient reported that he has been feeling depressed due to his Parkinson's disease and he falls sometimes. He reported that he went to Dr. Donneta Romberg and they have told him that he should be careful about his gait. Patient reported that he feels like a burden for his wife and he feels that everybody is looking at him. He has sympathy for himself. Patient reported that he has been compliant with his medication. He does not want to get any help. He reported that he does not want to use a scooter while he is in the shopping area. We talked at length about his medications and his gait issues. He is not having any tremors at this time.However his wife will try to encourage him and will tell him that nobody is looking at him.He reported that he enjoys walking and going out. He is a very social person and is the life of the party.  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 He spends his time at home. His wife works in [employer].  Associated Signs/Symptoms: Depression Symptoms:  depressed mood, anhedonia, insomnia, psychomotor retardation, fatigue, feelings of worthlessness/guilt, difficulty concentrating, impaired memory, anxiety, disturbed sleep, (Hypo) Manic Symptoms:  Irritable Mood, Labiality of Mood, Anxiety Symptoms:   Excessive Worry, Psychotic Symptoms:  Hallucinations: Visual PTSD Symptoms: Negative NA  Past Psychiatric History: Patient reported that he has never seen a psychiatrist in the past. He denied any history of suicide attempts. He denied any history of psychiatric hospitalization.   Previous Psychotropic Medications:  Celexa Trazodone  Substance Abuse History in the last 12 months:  Yes.   Drinking alcohol on a daily basis  Consequences of Substance Abuse: Negative NA  Past Medical History:  Past Medical History  Diagnosis Date  . Hypertension   . Parkinson's disease ([place])   . Hyperlipemia    History reviewed. No pertinent past surgical history.  Family Psychiatric History:  Mother- Dementia Father- anxiety   Family History:  Family History  Problem Relation Age of Onset  . Heart Problems Mother   . Dementia Mother     Social History:   Social History   Social History  . Marital Status: Married    Spouse Name: N/A  . Number of Children: N/A  . Years of Education: N/A   Social History Main Topics  . Smoking status: Current Some Day Smoker  . Smokeless tobacco: Never Used  . Alcohol Use: Yes  . Drug Use: No  . Sexual Activity: Yes   Other Topics Concern  . 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    Allergies:   Allergies  Allergen Reactions  . Amantadine Other (See Comments)    fatigue sleepy    Metabolic Disorder Labs: No results found for: HGBA1C, MPG No results found for: PROLACTIN No results found for: CHOL, TRIG, HDL, CHOLHDL, VLDL, LDLCALC   Current Medications: Current Outpatient Prescriptions  Medication Sig Dispense Refill  . carbidopa-levodopa (SINEMET IR) 25-100 MG per tablet Take 1 tablet by mouth 3 (three) times daily.    .  hydrochlorothiazide (HYDRODIURIL) 25 MG tablet Take 25 mg by mouth daily.    Marland Kitchen lisinopril (PRINIVIL,ZESTRIL) 20 MG tablet Take 20 mg by mouth 2 (two) times daily.    . sertraline (ZOLOFT) 100 MG tablet Take 0.5 tablets (50 mg total) by mouth daily. Take 1/2  pill daily x 1 week then 1  pills daily 30 tablet 1  . simvastatin (ZOCOR) 20 MG tablet Take 20 mg by mouth daily. Reported on 04/14/2015    . traZODone (DESYREL) 100 MG tablet Take 1 tablet (100 mg total) by mouth at bedtime. 30 tablet 0  . Vitamin D, Ergocalciferol, (DRISDOL) 50000 UNITS CAPS capsule Take 50,000 Units by mouth every 7 (seven) days.     No current facility-administered medications for this visit.    Neurologic: Headache: No Seizure: No Paresthesias:No  Musculoskeletal: Strength & Muscle Tone: within normal limits Gait & Station: normal Patient leans: N/A  Psychiatric Specialty Exam: Review of Systems  Neurological: Positive for tremors and focal weakness.  Psychiatric/Behavioral: Positive for depression and substance abuse. The patient is nervous/anxious and has insomnia.   All other systems reviewed and are negative.   Blood pressure 122/86, pulse 101, temperature 97.7 F (36.5 C), temperature source Tympanic, height 5' 5.5" (1.664 m), weight 157 lb (71.215 kg), SpO2 96 %.Body mass index is 25.72 kg/(m^2).  General Appearance: Casual  Eye Contact:  Fair  Speech:  Clear and Coherent  Volume:  Decreased  Mood:  Anxious and Depressed  Affect:  Congruent and Depressed  Thought Process:  Coherent  Orientation:  Full (Time, Place, and Person)  Thought Content:  WDL  Suicidal Thoughts:  No  Homicidal Thoughts:  No  Memory:  Immediate;   Fair  Judgement:  Fair  Insight:  Fair  Psychomotor Activity:  Normal  Concentration:  Fair  Recall:  [employer] of [place]  Language: Fair  Akathisia:  No  Handed:  Right  AIMS (if indicated):    Assets:  Communication Skills Desire for Improvement Social  Support  ADL's:  Intact  Cognition: WNL  Sleep:      Treatment Plan Summary: Medication management   Discussed with patient at length about his medications treatment risks benefits  Continue  Zoloft 50 mg daily  He will continue on trazodone as prescribed Follow-up in 1 month Advised him to call if he notices worsening of his symptoms and he demonstrated understanding   More than 50% of the time spent in psychoeducation, counseling and coordination of care.    This note was generated in part or whole with voice recognition software. Voice regonition is usually quite accurate but there are transcription errors that can and very often do occur. I apologize for any typographical errors that were not detected and corrected.   Rainey Pines, MD 6/1/20172:50 PM

## 2015-08-02 ENCOUNTER — Other Ambulatory Visit: Payer: Self-pay | Admitting: Psychiatry

## 2015-08-04 ENCOUNTER — Ambulatory Visit (INDEPENDENT_AMBULATORY_CARE_PROVIDER_SITE_OTHER): Payer: Federal, State, Local not specified - PPO | Admitting: Psychiatry

## 2015-08-04 DIAGNOSIS — G20A1 Parkinson's disease without dyskinesia, without mention of fluctuations: Secondary | ICD-10-CM

## 2015-08-04 DIAGNOSIS — F39 Unspecified mood [affective] disorder: Secondary | ICD-10-CM | POA: Diagnosis not present

## 2015-08-04 DIAGNOSIS — G20 Parkinson's disease: Secondary | ICD-10-CM

## 2015-08-04 MED ORDER — TRAZODONE HCL 100 MG PO TABS: 100.0000 mg | ORAL_TABLET | Freq: Every day | ORAL | 1 refills | Status: DC

## 2015-08-04 MED ORDER — SERTRALINE HCL 50 MG PO TABS: 50.0000 mg | ORAL_TABLET | Freq: Every day | ORAL | 1 refills | Status: DC

## 2015-08-04 NOTE — Progress Notes (Signed)
Psychiatric MD Progress Note   Patient Identification: Charles Stanton MRN:  [id number] Date of Evaluation:  08/04/2015 Referral Source: [name]  Chief Complaint:   Chief Complaint    Medication Refill     Visit Diagnosis:    ICD-9-CM ICD-10-CM   1. Episodic mood disorder ([place]) 296.90 F39   2. Parkinson disease ([place]) 332.0 G20     History of Present Illness:    Patient is a 60 year old male who presented for follow-up. He has history of Parkinson's disease and presented for the follow-up appointment. Patient walks with a slow gait  due to his Parkinson's disease. He also has history of depression. Patient reported that he has been feeling depressed due to his Parkinson's disease and he falls sometimes. He reported that His son got married 2 weeks ago and they had a good reception.. Then they all went to [facility] and enjoyed the vacation. Patient reported that he enjoyed the wedding of his son. He appeared excited day discussing in details about the wedding of his son.  He reported that he appears anxious when he is in a large crowd. He reported that he has been taking his medications as prescribed and is compliant with the Zoloft. He was also discussing about the erectile dysfunction which is present before he started taking the medication. His wife does not have any complaints. We discussed about it in detail. He was ashamed about discussing this issue. He reported that he does not have any other adverse effects at this time. He is compliant with his medications and want to continue the same medications at this time. He sleeps well with the help of trazodone.     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 He spends his time at home. His wife works in [employer].  Associated Signs/Symptoms: Depression Symptoms:  depressed mood, anhedonia, insomnia, psychomotor retardation, fatigue, feelings of  worthlessness/guilt, anxiety, disturbed sleep, (Hypo) Manic Symptoms:  Irritable Mood, Labiality of Mood, Anxiety Symptoms:  Excessive Worry, Psychotic Symptoms:  none PTSD Symptoms: Negative NA  Past Psychiatric History: Patient reported that he has never seen a psychiatrist in the past. He denied any history of suicide attempts. He denied any history of psychiatric hospitalization.   Previous Psychotropic Medications:  Celexa Trazodone  Substance Abuse History in the last 12 months:  Yes.   Drinking alcohol on a daily basis  Consequences of Substance Abuse: Negative NA  Past Medical History:  Past Medical History:  Diagnosis Date  . Hyperlipemia   . Hypertension   . Parkinson's disease ([place])    No past surgical history on file.  Family Psychiatric History:  Mother- Dementia Father- anxiety   Family History:  Family History  Problem Relation Age of Onset  . Heart Problems Mother   . Dementia Mother     Social History:   Social History   Social History  . Marital status: Married    Spouse name: N/A  . Number of children: N/A  . Years of education: N/A   Social History Main Topics  . Smoking status: Current Some Day Smoker  . Smokeless tobacco: Never Used  . Alcohol use Yes  . Drug use: No  . Sexual activity: Yes   Other Topics Concern  . Not on file   Social History Narrative  . 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    Allergies:   Allergies  Allergen Reactions  . Amantadine Other (See Comments)    fatigue sleepy    Metabolic Disorder Labs: No results found for: HGBA1C, MPG No results found for: PROLACTIN No results found for: CHOL, TRIG, HDL, CHOLHDL, VLDL, LDLCALC   Current Medications: Current Outpatient Prescriptions  Medication Sig Dispense  Refill  . carbidopa-levodopa (SINEMET IR) 25-100 MG per tablet Take 1 tablet by mouth 3 (three) times daily.    . hydrochlorothiazide (HYDRODIURIL) 25 MG tablet Take 25 mg by mouth daily.    Marland Kitchen lisinopril (PRINIVIL,ZESTRIL) 20 MG tablet Take 20 mg by mouth 2 (two) times daily.    . sertraline (ZOLOFT) 50 MG tablet Take 1 tablet (50 mg total) by mouth daily. 30 tablet 1  . simvastatin (ZOCOR) 20 MG tablet Take 20 mg by mouth daily. Reported on 04/14/2015    . traZODone (DESYREL) 100 MG tablet Take 1 tablet (100 mg total) by mouth at bedtime. 30 tablet 1  . Vitamin D, Ergocalciferol, (DRISDOL) 50000 UNITS CAPS capsule Take 50,000 Units by mouth every 7 (seven) days.     No current facility-administered medications for this visit.     Neurologic: Headache: No Seizure: No Paresthesias:No  Musculoskeletal: Strength & Muscle Tone: within normal limits Gait & Station: normal Patient leans: N/A  Psychiatric Specialty Exam: Review of Systems  Constitutional: Positive for weight loss.  Neurological: Positive for tremors, focal weakness and weakness.  Psychiatric/Behavioral: Positive for depression. The patient is nervous/anxious and has insomnia.   All other systems reviewed and are negative.   There were no vitals taken for this visit.There is no height or weight on file to calculate BMI.  General Appearance: Casual  Eye Contact:  Fair  Speech:  Clear and Coherent  Volume:  Decreased  Mood:  Anxious and Depressed  Affect:  Congruent  Thought Process:  Coherent  Orientation:  Full (Time, Place, and Person)  Thought Content:  WDL  Suicidal Thoughts:  No  Homicidal Thoughts:  No  Memory:  Immediate;   Fair  Judgement:  Fair  Insight:  Fair  Psychomotor Activity:  Normal  Concentration:  Fair  Recall:  [employer] [place]  Language: Fair  Akathisia:  No  Handed:  Right  AIMS (if indicated):    Assets:  Communication Skills Desire for Improvement Social Support  ADL's:   Intact  Cognition: WNL  Sleep:      Treatment Plan Summary: Medication management   Discussed with patient at length about his medications treatment risks benefits  Continue  Zoloft 50 mg daily  He will continue on trazodone as prescribed Follow-up in 2  month Advised him to call if he notices worsening of his symptoms and he demonstrated understanding   More than 50% of the time spent in psychoeducation, counseling and coordination of care.    This note was generated in part or whole with voice recognition software. Voice regonition is usually quite accurate but there are transcription errors that can and very often do occur. I apologize for any typographical errors that were not detected and corrected.   Rainey Pines, MD 8/1/20172:52 PM

## 2015-08-05 ENCOUNTER — Other Ambulatory Visit: Payer: Self-pay | Admitting: Psychiatry

## 2015-08-05 MED ORDER — TRAZODONE HCL 100 MG PO TABS: 100.0000 mg | ORAL_TABLET | Freq: Every day | ORAL | 1 refills | Status: DC

## 2015-08-05 MED ORDER — SERTRALINE HCL 50 MG PO TABS: 50.0000 mg | ORAL_TABLET | Freq: Every day | ORAL | 1 refills | Status: DC

## 2015-10-05 ENCOUNTER — Encounter: Payer: Self-pay | Admitting: Psychiatry

## 2015-10-05 ENCOUNTER — Ambulatory Visit (INDEPENDENT_AMBULATORY_CARE_PROVIDER_SITE_OTHER): Payer: Federal, State, Local not specified - PPO | Admitting: Psychiatry

## 2015-10-05 VITALS — BP 131/88 | HR 90 | Temp 98.6°F | Wt 161.4 lb

## 2015-10-05 DIAGNOSIS — G20 Parkinson's disease: Secondary | ICD-10-CM | POA: Diagnosis not present

## 2015-10-05 DIAGNOSIS — F39 Unspecified mood [affective] disorder: Secondary | ICD-10-CM | POA: Diagnosis not present

## 2015-10-05 MED ORDER — SERTRALINE HCL 50 MG PO TABS: 50.0000 mg | ORAL_TABLET | Freq: Every day | ORAL | 1 refills | Status: DC

## 2015-10-05 MED ORDER — TRAZODONE HCL 100 MG PO TABS: 100.0000 mg | ORAL_TABLET | Freq: Every day | ORAL | 1 refills | Status: DC

## 2015-10-05 NOTE — Progress Notes (Signed)
Psychiatric MD Progress Note   Patient Identification: Charles Charles MRN:  [id number] Date of Evaluation:  10/05/2015 Referral Source: Charles Charles- Therapist  Chief Complaint:   Chief Complaint    Follow-up; Medication Refill     Visit Diagnosis:    ICD-9-CM ICD-10-CM   1. Episodic mood disorder ([place]) 296.90 F39   2. Parkinson disease ([place]) 332.0 G20     History of Present Illness:    Patient is a 60 year old male who presented for follow-up. He has history of Parkinson's disease and presented for the follow-up appointment. Patient Reported that he went to [facility] with his wife. He spent almost 10 days there. He reported that he was feeling anxious going back and forth to the [facility] and was having motion sickness. We discussed about over-the-counter medications. He reported that his wife is very helpful and she wants him to start getting involved in the exercise program for the Parkinson's disease. He is receptive to this suggestion. He reported that he is also walking better. Patient does not have any tremors at this time. He appeared more alert during the interview. He reported that the medications are helping him. He denied having any suicidal ideations or plans. He denied having any perceptual disturbances at this time.   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 He spends his time at home. His wife works in [employer].  Associated Signs/Symptoms: Depression Symptoms:  depressed mood, anhedonia, insomnia, psychomotor retardation, fatigue, feelings of worthlessness/guilt, anxiety, disturbed sleep, (Hypo) Manic Symptoms:  Irritable Mood, Labiality of Mood, Anxiety Symptoms:  Excessive Worry, Psychotic Symptoms:  none PTSD Symptoms: Negative NA  Past Psychiatric History: Patient reported that he has never seen a psychiatrist in the past. He denied any history of suicide attempts. He denied any history of psychiatric  hospitalization.   Previous Psychotropic Medications:  Celexa Trazodone  Substance Abuse History in the last 12 months:  Yes.   Drinking alcohol on a daily basis  Consequences of Substance Abuse: Negative NA  Past Medical History:  Past Medical History:  Diagnosis Date  . Hyperlipemia   . Hypertension   . Parkinson's disease ([place])    History reviewed. No pertinent surgical history.  Family Psychiatric History:  Mother- Dementia Father- anxiety   Family History:  Family History  Problem Relation Age of Onset  . Heart Problems Mother   . Dementia Mother     Social History:   Social History   Social History  . Marital status: Married    Spouse name: N/A  . Number of children: N/A  . Years of education: N/A   Social History Main Topics  . Smoking status: Current Some Day Smoker  . Smokeless tobacco: Never Used  . Alcohol use Yes  . Drug use: No  . Sexual activity: Yes   Other Topics Concern  . None   Social History Narrative  . 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    Allergies:   Allergies  Allergen Reactions  . Amantadine Other (See Comments)    fatigue sleepy    Metabolic Disorder Labs: No results found for: HGBA1C, MPG No results found for: PROLACTIN No results found for: CHOL, TRIG, HDL, CHOLHDL, VLDL, LDLCALC  Current Medications: Current Outpatient Prescriptions  Medication Sig Dispense Refill  . carbidopa-levodopa (SINEMET IR) 25-100 MG per tablet Take 1 tablet by mouth 3 (three) times daily.    . hydrochlorothiazide (HYDRODIURIL) 25 MG tablet Take 25 mg by mouth daily.    Marland Kitchen lisinopril (PRINIVIL,ZESTRIL) 20 MG tablet Take 20 mg by mouth 2 (two) times daily.    . sertraline (ZOLOFT) 50 MG tablet Take 1 tablet (50 mg total) by mouth daily. 90 tablet 1  .  simvastatin (ZOCOR) 20 MG tablet Take 20 mg by mouth daily. Reported on 04/14/2015    . traZODone (DESYREL) 100 MG tablet Take 1 tablet (100 mg total) by mouth at bedtime. 90 tablet 1  . Vitamin D, Ergocalciferol, (DRISDOL) 50000 UNITS CAPS capsule Take 50,000 Units by mouth every 7 (seven) days.     No current facility-administered medications for this visit.     Neurologic: Headache: No Seizure: No Paresthesias:No  Musculoskeletal: Strength & Muscle Tone: within normal limits Gait & Station: normal Patient leans: N/A  Psychiatric Specialty Exam: Review of Systems  Constitutional: Positive for weight loss.  Neurological: Positive for tremors, focal weakness and weakness.  Psychiatric/Behavioral: Positive for depression. The patient is nervous/anxious and has insomnia.   All other systems reviewed and are negative.   Blood pressure 131/88, pulse 90, temperature 98.6 F (37 C), temperature source Oral, weight 161 lb 6.4 oz (73.2 kg).Body mass index is 26.45 kg/m.  General Appearance: Casual  Eye Contact:  Fair  Speech:  Clear and Coherent  Volume:  Normal  Mood:  Anxious  Affect:  Congruent  Thought Process:  Coherent  Orientation:  Full (Time, Place, and Person)  Thought Content:  WDL  Suicidal Thoughts:  No  Homicidal Thoughts:  No  Memory:  Immediate;   Fair  Judgement:  Fair  Insight:  Fair  Psychomotor Activity:  Normal  Concentration:  Fair  Recall:  [employer]:Fair  Language: Fair  Akathisia:  No  Handed:  Right  AIMS (if indicated):    Assets:  Communication Skills Desire for Improvement Social Support  ADL's:  Intact  Cognition: WNL  Sleep:      Treatment Plan Summary: Medication management   Discussed with patient at length about his medications treatment risks benefits  Continue  Zoloft 50 mg daily  He will continue on trazodone as prescribed Follow-up in 2  Month  Advised patient that I will be leaving this practice in 2 months  and he demonstrated understanding. He will follow up with the next provider and will continue his medications as prescribed. Advised him to call if he notices worsening of his symptoms and he demonstrated understanding   More than 50% of the time spent in psychoeducation, counseling and coordination of care.    This note was generated in part or whole with voice recognition software. Voice regonition is usually quite accurate but there are transcription errors that can and very often do occur. I apologize for any typographical errors that were not detected and corrected.   Rainey Pines, MD 10/2/20172:47 PM

## 2016-01-06 ENCOUNTER — Ambulatory Visit: Payer: Federal, State, Local not specified - PPO | Admitting: Psychiatry

## 2016-01-27 ENCOUNTER — Emergency Department: Payer: Federal, State, Local not specified - PPO

## 2016-01-27 ENCOUNTER — Inpatient Hospital Stay
Admission: EM | Admit: 2016-01-27 | Discharge: 2016-01-31 | DRG: 392 | Disposition: A | Discharge status: Home Health | Payer: Federal, State, Local not specified - PPO | Attending: Internal Medicine | Admitting: Internal Medicine

## 2016-01-27 DIAGNOSIS — Z86010 Personal history of colonic polyps: Secondary | ICD-10-CM

## 2016-01-27 DIAGNOSIS — Z23 Encounter for immunization: Secondary | ICD-10-CM | POA: Diagnosis not present

## 2016-01-27 DIAGNOSIS — G20A1 Parkinson's disease without dyskinesia, without mention of fluctuations: Secondary | ICD-10-CM

## 2016-01-27 DIAGNOSIS — K529 Noninfective gastroenteritis and colitis, unspecified: Secondary | ICD-10-CM | POA: Diagnosis present

## 2016-01-27 DIAGNOSIS — I959 Hypotension, unspecified: Secondary | ICD-10-CM | POA: Diagnosis present

## 2016-01-27 DIAGNOSIS — G20 Parkinson's disease: Secondary | ICD-10-CM | POA: Diagnosis not present

## 2016-01-27 DIAGNOSIS — R1084 Generalized abdominal pain: Secondary | ICD-10-CM

## 2016-01-27 DIAGNOSIS — R634 Abnormal weight loss: Secondary | ICD-10-CM | POA: Diagnosis present

## 2016-01-27 DIAGNOSIS — M6281 Muscle weakness (generalized): Secondary | ICD-10-CM

## 2016-01-27 DIAGNOSIS — E119 Type 2 diabetes mellitus without complications: Secondary | ICD-10-CM | POA: Diagnosis present

## 2016-01-27 DIAGNOSIS — F17200 Nicotine dependence, unspecified, uncomplicated: Secondary | ICD-10-CM | POA: Diagnosis present

## 2016-01-27 DIAGNOSIS — K5900 Constipation, unspecified: Secondary | ICD-10-CM

## 2016-01-27 DIAGNOSIS — E860 Dehydration: Secondary | ICD-10-CM | POA: Diagnosis present

## 2016-01-27 DIAGNOSIS — R112 Nausea with vomiting, unspecified: Secondary | ICD-10-CM

## 2016-01-27 DIAGNOSIS — I10 Essential (primary) hypertension: Secondary | ICD-10-CM | POA: Diagnosis present

## 2016-01-27 DIAGNOSIS — I4581 Long QT syndrome: Secondary | ICD-10-CM | POA: Diagnosis present

## 2016-01-27 DIAGNOSIS — I480 Paroxysmal atrial fibrillation: Secondary | ICD-10-CM | POA: Diagnosis present

## 2016-01-27 DIAGNOSIS — F1020 Alcohol dependence, uncomplicated: Secondary | ICD-10-CM | POA: Diagnosis present

## 2016-01-27 DIAGNOSIS — I4891 Unspecified atrial fibrillation: Secondary | ICD-10-CM | POA: Diagnosis not present

## 2016-01-27 DIAGNOSIS — D473 Essential (hemorrhagic) thrombocythemia: Secondary | ICD-10-CM | POA: Diagnosis present

## 2016-01-27 DIAGNOSIS — Z800 Family history of malignant neoplasm of digestive organs: Secondary | ICD-10-CM

## 2016-01-27 DIAGNOSIS — I498 Other specified cardiac arrhythmias: Secondary | ICD-10-CM

## 2016-01-27 DIAGNOSIS — R9431 Abnormal electrocardiogram [ECG] [EKG]: Secondary | ICD-10-CM

## 2016-01-27 DIAGNOSIS — E876 Hypokalemia: Secondary | ICD-10-CM

## 2016-01-27 DIAGNOSIS — E785 Hyperlipidemia, unspecified: Secondary | ICD-10-CM | POA: Diagnosis present

## 2016-01-27 DIAGNOSIS — K5909 Other constipation: Secondary | ICD-10-CM | POA: Diagnosis present

## 2016-01-27 DIAGNOSIS — F419 Anxiety disorder, unspecified: Secondary | ICD-10-CM | POA: Diagnosis present

## 2016-01-27 DIAGNOSIS — Z85828 Personal history of other malignant neoplasm of skin: Secondary | ICD-10-CM

## 2016-01-27 DIAGNOSIS — R262 Difficulty in walking, not elsewhere classified: Secondary | ICD-10-CM

## 2016-01-27 DIAGNOSIS — D638 Anemia in other chronic diseases classified elsewhere: Secondary | ICD-10-CM | POA: Diagnosis present

## 2016-01-27 DIAGNOSIS — K6389 Other specified diseases of intestine: Secondary | ICD-10-CM | POA: Diagnosis not present

## 2016-01-27 DIAGNOSIS — R1030 Lower abdominal pain, unspecified: Secondary | ICD-10-CM | POA: Diagnosis present

## 2016-01-27 DIAGNOSIS — E8809 Other disorders of plasma-protein metabolism, not elsewhere classified: Secondary | ICD-10-CM | POA: Diagnosis present

## 2016-01-27 HISTORY — DX: Anxiety disorder, unspecified: F41.9

## 2016-01-27 HISTORY — DX: Palpitations: R00.2

## 2016-01-27 LAB — BASIC METABOLIC PANEL
Anion gap: 4 — ABNORMAL LOW (ref 5–15)
BUN: 9 mg/dL (ref 6–20)
CHLORIDE: 98 mmol/L — AB (ref 101–111)
CO2: 32 mmol/L (ref 22–32)
Calcium: 6.7 mg/dL — ABNORMAL LOW (ref 8.9–10.3)
Creatinine, Ser: 0.62 mg/dL (ref 0.61–1.24)
GFR calc Af Amer: >60 mL/min (ref >60)
GFR calc non Af Amer: >60 mL/min (ref >60)
Glucose, Bld: 91 mg/dL (ref 65–99)
POTASSIUM: 5.2 mmol/L — AB (ref 3.5–5.1)
SODIUM: 134 mmol/L — AB (ref 135–145)

## 2016-01-27 LAB — URINALYSIS, COMPLETE (UACMP) WITH MICROSCOPIC
Bacteria, UA: NONE SEEN
Bilirubin Urine: NEGATIVE
Glucose, UA: NEGATIVE mg/dL
HGB URINE DIPSTICK: NEGATIVE
Ketones, ur: NEGATIVE mg/dL
Leukocytes, UA: NEGATIVE
Nitrite: NEGATIVE
Protein, ur: NEGATIVE mg/dL
SPECIFIC GRAVITY, URINE: 1.027 (ref 1.005–1.030)
Squamous Epithelial / LPF: NONE SEEN
pH: 6 (ref 5.0–8.0)

## 2016-01-27 LAB — MAGNESIUM: MAGNESIUM: 1.6 mg/dL — AB (ref 1.7–2.4)

## 2016-01-27 LAB — COMPREHENSIVE METABOLIC PANEL
ALBUMIN: 3.4 g/dL — AB (ref 3.5–5.0)
ALT: 6 U/L — AB (ref 17–63)
AST: 50 U/L — ABNORMAL HIGH (ref 15–41)
Alkaline Phosphatase: 196 U/L — ABNORMAL HIGH (ref 38–126)
Anion gap: 15 (ref 5–15)
BUN: 10 mg/dL (ref 6–20)
CALCIUM: 9 mg/dL (ref 8.9–10.3)
CHLORIDE: 83 mmol/L — AB (ref 101–111)
CO2: 33 mmol/L — AB (ref 22–32)
CREATININE: 0.83 mg/dL (ref 0.61–1.24)
GFR calc Af Amer: >60 mL/min (ref >60)
GFR calc non Af Amer: >60 mL/min (ref >60)
GLUCOSE: 155 mg/dL — AB (ref 65–99)
Potassium: 2.6 mmol/L — CL (ref 3.5–5.1)
SODIUM: 131 mmol/L — AB (ref 135–145)
Total Bilirubin: 1.7 mg/dL — ABNORMAL HIGH (ref 0.3–1.2)
Total Protein: 7.2 g/dL (ref 6.5–8.1)

## 2016-01-27 LAB — CBC
HCT: 26.2 % — ABNORMAL LOW (ref 40.0–52.0)
Hemoglobin: 8.6 g/dL — ABNORMAL LOW (ref 13.0–18.0)
MCH: 29.6 pg (ref 26.0–34.0)
MCHC: 32.8 g/dL (ref 32.0–36.0)
MCV: 90.4 fL (ref 80.0–100.0)
Platelets: 796 10*3/uL — ABNORMAL HIGH (ref 150–440)
RBC: 2.9 MIL/uL — ABNORMAL LOW (ref 4.40–5.90)
RDW: 22.6 % — ABNORMAL HIGH (ref 11.5–14.5)
WBC: 14.1 10*3/uL — ABNORMAL HIGH (ref 3.8–10.6)

## 2016-01-27 LAB — TROPONIN I

## 2016-01-27 LAB — LIPASE, BLOOD: Lipase: 30 U/L (ref 11–51)

## 2016-01-27 MED ORDER — IOPAMIDOL (ISOVUE-300) INJECTION 61%
100.0000 mL | Freq: Once | INTRAVENOUS | Status: AC | PRN
  Administered 2016-01-27: 100 mL via INTRAVENOUS

## 2016-01-27 MED ORDER — SODIUM CHLORIDE 0.9 % IV SOLN: 1.0000 g | Freq: Three times a day (TID) | INTRAVENOUS | Status: DC

## 2016-01-27 MED ORDER — SODIUM CHLORIDE 0.9 % IV BOLUS (SEPSIS)
1000.0000 mL | Freq: Once | INTRAVENOUS | Status: AC
  Administered 2016-01-27: 1000 mL via INTRAVENOUS

## 2016-01-27 MED ORDER — PIPERACILLIN-TAZOBACTAM 3.375 G IVPB
3.3750 g | INTRAVENOUS | Status: AC
  Administered 2016-01-27: 3.375 g via INTRAVENOUS
  Filled 2016-01-27: qty 50

## 2016-01-27 MED ORDER — IOPAMIDOL (ISOVUE-300) INJECTION 61%
30.0000 mL | Freq: Once | INTRAVENOUS | Status: AC | PRN
  Administered 2016-01-27: 30 mL via ORAL

## 2016-01-27 MED ORDER — MEROPENEM-SODIUM CHLORIDE 1 GM/50ML IV SOLR
1.0000 g | Freq: Three times a day (TID) | INTRAVENOUS | Status: DC
  Administered 2016-01-28 (×2): 1 g via INTRAVENOUS
  Filled 2016-01-27 (×3): qty 50

## 2016-01-27 MED ORDER — THIAMINE HCL 100 MG/ML IJ SOLN
100.0000 mg | Freq: Once | INTRAMUSCULAR | Status: AC
  Administered 2016-01-27: 100 mg via INTRAVENOUS
  Filled 2016-01-27: qty 2

## 2016-01-27 MED ORDER — ONDANSETRON HCL 4 MG/2ML IJ SOLN
4.0000 mg | Freq: Once | INTRAMUSCULAR | Status: AC
  Administered 2016-01-27: 4 mg via INTRAVENOUS
  Filled 2016-01-27: qty 2

## 2016-01-27 MED ORDER — MORPHINE SULFATE (PF) 4 MG/ML IV SOLN
4.0000 mg | Freq: Once | INTRAVENOUS | Status: AC
  Administered 2016-01-27: 4 mg via INTRAVENOUS
  Filled 2016-01-27: qty 1

## 2016-01-27 MED ORDER — SODIUM CHLORIDE 0.9 % IV SOLN
30.0000 meq | Freq: Once | INTRAVENOUS | Status: AC
  Administered 2016-01-27: 30 meq via INTRAVENOUS
  Filled 2016-01-27 (×2): qty 15

## 2016-01-27 NOTE — H&P (Signed)
[facility] [place] NAME: Charles Stanton    MR#:  [id number]  DATE OF BIRTH:  [birth year]  DATE OF ADMISSION:  01/27/2016  PRIMARY CARE PHYSICIAN: No PCP Per Patient   REQUESTING/REFERRING PHYSICIAN: Karma Greaser, MD  CHIEF COMPLAINT:   Chief Complaint  Patient presents with  . Abdominal Pain  . Constipation    HISTORY OF PRESENT ILLNESS:  Charles Stanton  is a 61 y.o. male who presents with Abdominal pain, constipation, nausea. Evaluation in the ED showed hypokalemia, elevated white count, and on CT scan significant lower GI tract stool burden with some inflammation and bowel wall thickening distally. Patient was covered with antibiotics in the ED, and hospitalists were called for admission.  PAST MEDICAL HISTORY:   Past Medical History:  Diagnosis Date  . Anxiety   . Hyperlipemia   . Hypertension   . Palpitations   . Parkinson's disease ([place])     PAST SURGICAL HISTORY:   Past Surgical History:  Procedure Laterality Date  . TONSILLECTOMY      SOCIAL HISTORY:   Social History  Substance Use Topics  . Smoking status: Current Some Day Smoker  . Smokeless tobacco: Never Used  . Alcohol use Yes    FAMILY HISTORY:   Family History  Problem Relation Age of Onset  . Heart Problems Mother   . Dementia Mother     DRUG ALLERGIES:   Allergies  Allergen Reactions  . Amantadine Other (See Comments)    fatigue sleepy    MEDICATIONS AT HOME:   Prior to Admission medications   Medication Sig Start Date End Date Taking? Authorizing Provider  carbidopa-levodopa (SINEMET IR) 25-100 MG per tablet Take 1 tablet by mouth 3 (three) times daily.    Historical Provider, MD  hydrochlorothiazide (HYDRODIURIL) 25 MG tablet Take 25 mg by mouth daily.    Historical Provider, MD  lisinopril (PRINIVIL,ZESTRIL) 20 MG tablet Take 20 mg by mouth 2 (two) times daily.    Historical Provider, MD  sertraline (ZOLOFT) 50 MG tablet Take 1 tablet  (50 mg total) by mouth daily. 10/05/15   Rainey Pines, MD  simvastatin (ZOCOR) 20 MG tablet Take 20 mg by mouth daily. Reported on 04/14/2015    Historical Provider, MD  traZODone (DESYREL) 100 MG tablet Take 1 tablet (100 mg total) by mouth at bedtime. 10/05/15   Rainey Pines, MD  Vitamin D, Ergocalciferol, (DRISDOL) 50000 UNITS CAPS capsule Take 50,000 Units by mouth every 7 (seven) days.    Historical Provider, MD    REVIEW OF SYSTEMS:  Review of Systems  Constitutional: Negative for chills, fever, malaise/fatigue and weight loss.  HENT: Negative for ear pain, hearing loss and tinnitus.   Eyes: Negative for blurred vision, double vision, pain and redness.  Respiratory: Negative for cough, hemoptysis and shortness of breath.   Cardiovascular: Negative for chest pain, palpitations, orthopnea and leg swelling.  Gastrointestinal: Positive for abdominal pain, constipation and nausea. Negative for diarrhea and vomiting.  Genitourinary: Negative for dysuria, frequency and hematuria.  Musculoskeletal: Negative for back pain, joint pain and neck pain.  Skin:       No acne, rash, or lesions  Neurological: Negative for dizziness, tremors, focal weakness and weakness.  Endo/Heme/Allergies: Negative for polydipsia. Does not bruise/bleed easily.  Psychiatric/Behavioral: Negative for depression. The patient is not nervous/anxious and does not have insomnia.      VITAL SIGNS:   Vitals:   01/27/16 1930 01/27/16 2000 01/27/16 2130 01/27/16  2200  BP: (!) 109/54 (!) 105/46 108/66 118/80  Pulse: 95 99 85 97  Resp: (!) 0 (!) 30 18 (!) 31  Temp:      TempSrc:      SpO2: 97% 100% 99% 95%  Weight:      Height:       Wt Readings from Last 3 Encounters:  01/27/16 68 kg (150 lb)    PHYSICAL EXAMINATION:  Physical Exam  Vitals reviewed. Constitutional: He is oriented to person, place, and time. He appears well-developed and well-nourished. No distress.  HENT:  Head: Normocephalic and atraumatic.  Dry  mucous membranes  Eyes: Conjunctivae and EOM are normal. Pupils are equal, round, and reactive to light. No scleral icterus.  Neck: Normal range of motion. Neck supple. No JVD present. No thyromegaly present.  Cardiovascular: Normal rate, regular rhythm and intact distal pulses.  Exam reveals no gallop and no friction rub.   No murmur heard. Respiratory: Effort normal and breath sounds normal. No respiratory distress. He has no wheezes. He has no rales.  GI: Soft. Bowel sounds are normal. He exhibits no distension. There is tenderness.  Musculoskeletal: Normal range of motion. He exhibits no edema.  No arthritis, no gout  Lymphadenopathy:    He has no cervical adenopathy.  Neurological: He is alert and oriented to person, place, and time. No cranial nerve deficit.  No dysarthria, no aphasia  Skin: Skin is warm and dry. No rash noted. No erythema.  Psychiatric: He has a normal mood and affect. His behavior is normal. Judgment and thought content normal.    LABORATORY PANEL:   CBC  Recent Labs Lab 01/27/16 1751  WBC 14.1*  HGB 8.6*  HCT 26.2*  PLT 796*   ------------------------------------------------------------------------------------------------------------------  Chemistries   Recent Labs Lab 01/27/16 1751  NA 131*  K 2.6*  CL 83*  CO2 33*  GLUCOSE 155*  BUN 10  CREATININE 0.83  CALCIUM 9.0  AST 50*  ALT 6*  ALKPHOS 196*  BILITOT 1.7*   ------------------------------------------------------------------------------------------------------------------  Cardiac Enzymes No results for input(s): TROPONINI in the last 168 hours. ------------------------------------------------------------------------------------------------------------------  RADIOLOGY:  Dg Chest 2 View  Result Date: 01/27/2016 CLINICAL DATA:  61 year old male with cough.  History of AFib. EXAM: CHEST  2 VIEW COMPARISON:  Chest CT dated 11/27/2008 FINDINGS: There are bibasilar hazy densities  which may represent atelectatic changes versus infiltrate. No significant pleural effusion or pneumothorax. The cardiac borders are not well visualized and silhouetted by the adjacent soft tissues. There is osteopenia with degenerative changes of the spine. There is compression fracture of a mid to lower thoracic vertebra with anterior wedging which is age indeterminate. There is also compression fracture of an upper lumbar vertebra with anterior wedging. Clinical correlation is recommended. IMPRESSION: Bibasilar hazy densities may represent atelectasis versus infiltrate. Clinical correlation is recommended. Osteopenia with age indeterminate compression deformities of a mid to lower thoracic and a lumbar vertebra. Correlation with clinical exam and point tenderness recommended. Electronically Signed   By: Anner Crete M.D.   On: 01/27/2016 21:21   Ct Abdomen Pelvis W Contrast  Result Date: 01/27/2016 CLINICAL DATA:  Three day history of abdominal pain. EXAM: CT ABDOMEN AND PELVIS WITH CONTRAST TECHNIQUE: Multidetector CT imaging of the abdomen and pelvis was performed using the standard protocol following bolus administration of intravenous contrast. CONTRAST:  137mL ISOVUE-300 IOPAMIDOL (ISOVUE-300) INJECTION 61% COMPARISON:  11/27/2008 FINDINGS: Lower chest: Compressive atelectasis noted lower lungs, right greater than left. Heart is enlarged with coronary  artery calcification. Hepatobiliary: Geographic fatty deposition noted within the liver parenchyma. There is no evidence for gallstones, gallbladder wall thickening, or pericholecystic fluid. No intrahepatic or extrahepatic biliary dilation. Pancreas: No focal mass lesion. No dilatation of the main duct. No intraparenchymal cyst. No peripancreatic edema. Spleen: No splenomegaly. No focal mass lesion. Adrenals/Urinary Tract: No adrenal nodule or mass. Kidneys are unremarkable. No evidence for hydroureter. The urinary bladder appears normal for the degree  of distention. Stomach/Bowel: Small to moderate hiatal hernia noted. Stomach otherwise unremarkable. Duodenum is normally positioned as is the ligament of Treitz. No small bowel wall thickening. No small bowel dilatation. The terminal ileum is normal. The appendix is normal. Prominent stool volume distal colon with large stool volume noted in the rectum. Question circumferential wall thickening in the distal most aspect of the rectum, but not a definite finding. Vascular/Lymphatic: There is abdominal aortic atherosclerosis without aneurysm. There is no gastrohepatic or hepatoduodenal ligament lymphadenopathy. No intraperitoneal or retroperitoneal lymphadenopathy. No pelvic sidewall lymphadenopathy. Reproductive: The prostate gland and seminal vesicles have normal imaging features. Other: No intraperitoneal free fluid. Musculoskeletal: There is some nonspecific perirectal edema with extraperitoneal mild edema seen in the anterior left pelvic sidewall, indeterminate. Compression fracture at L2 appears acute and results and 50-75% loss of height anteriorly with less than 25% loss of height posteriorly. Compression deformity at T9 is age-indeterminate. Old bilateral rib fractures are evident. IMPRESSION: 1. Prominent stool volume in the distal colon and rectum. There is some collapse crescent there is a question of circumferential wall thickening in the distal most portion of the rectum with mild perirectal edema. 2. Acute L2 compression fracture with age-indeterminate fracture seen at T9. 3. Geographic fatty deposition in the liver parenchyma. Electronically Signed   By: Misty Stanley M.D.   On: 01/27/2016 21:07    EKG:   Orders placed or performed during the hospital encounter of 01/27/16  . ED EKG  . ED EKG  . EKG 12-Lead  . EKG 12-Lead    IMPRESSION AND PLAN:  Principal Problem:   Colitis - patient has bowel wall thickening is distal colon as well as elevated white count. Antibiotics started in the ED  and continued on admission. Active Problems:   Atrial fibrillation [facility]) - this is a new diagnosis. We have ordered an echocardiogram and cardiology consult for initial workup.   Type 2 diabetes mellitus (HCC) - sliding scale insulin with corresponding glucose checks   BP (high blood pressure) - continue home meds   Hypokalemia - replace and continue to monitor   Parkinson's disease ([place]) - continue home meds   HLD (hyperlipidemia) - home dose anti-lipids   Anxiety - continue home meds  All the records are reviewed and case discussed with ED provider. Management plans discussed with the patient and/or family.  DVT PROPHYLAXIS: SubQ lovenox  GI PROPHYLAXIS: None  ADMISSION STATUS: Inpatient  CODE STATUS: Full Code Status History    This patient does not have a recorded code status. Please follow your organizational policy for patients in this situation.      TOTAL TIME TAKING CARE OF THIS PATIENT: 45 minutes.    Hibba Schram Bell 01/27/2016, 10:16 PM  [facility]  Office  [phone]  CC: Primary care physician; No PCP Per Patient

## 2016-01-27 NOTE — ED Triage Notes (Signed)
Sharp pain, worse when attempting to have BM X 3 days. Pt alert and oriented X4, active, cooperative, pt in NAD. RR even and unlabored, color WNL.

## 2016-01-27 NOTE — Progress Notes (Signed)
Pharmacy Antibiotic Note  Charles Stanton is a 61 y.o. male admitted on 01/27/2016 with intra-abdominal infection.  Pharmacy has been consulted for meropenem dosing.  Plan: Meropenem 1 gram q 8 hours ordered.  Height: 5\' 7"  (170.2 cm) Weight: 150 lb (68 kg) IBW/kg (Calculated) : 66.1  Temp (24hrs), Avg:97.6 F (36.4 C), Min:97.6 F (36.4 C), Max:97.6 F (36.4 C)   Recent Labs Lab 01/27/16 1751 01/27/16 2200  WBC 14.1*  --   CREATININE 0.83 0.62    Estimated Creatinine Clearance: 93 mL/min (by C-G formula based on SCr of 0.62 mg/dL).    Allergies  Allergen Reactions  . Amantadine Other (See Comments)    fatigue sleepy    Antimicrobials this admission: Zosyn x1  >>  meropenem 1/25 >>   Dose adjustments this admission:   Microbiology results: No micro    1/24 UA: (-) 1/24 CXR: atelectasis vs. infiltrate  Thank you for allowing pharmacy to be a part of this patient's care.  Tashona Calk S 01/27/2016 11:42 PM

## 2016-01-27 NOTE — ED Provider Notes (Signed)
----------------------------------------- 8:20 PM on 01/27/2016 -----------------------------------------   Blood pressure 114/66, pulse 92, temperature 97.6 F (36.4 C), temperature source Oral, resp. rate 10, height 5\' 7"  (1.702 m), weight 68 kg, SpO2 98 %.  Assuming care from Dr. Jacqualine Code.  In short, Charles Stanton is a 61 y.o. male with a chief complaint of Abdominal Pain and Constipation .  Refer to the original H&P for additional details.  The current plan of care is to follow up on the results of the CT scan.  However, the patient is not tolerating by mouth intake, clinically appears dehydrated, has a potassium of 2.6 (which is likely contributing strongly to the constipation), has an unusual atrial arrhythmia on EKG, and has a prolonged QT interval.  He will require admission but we need to know first whether or not he has a surgical emergency.  I added on a magnesium level, thiamine 100 mg IV (patient is a heavy daily drinker), and will reassess.   Clinical Course as of Jan 26 2149  Wed Jan 27, 2016  2136 Discussed with Dr. Dahlia Byes with surgery by phone.  He does not feel that there is an acute surgical issue.  We discussed the possibility of infection given the mild leukocytosis, and we agreed there is little downside to empiric treatment.  I will order Cipro and Flagyl.  Discussed with hospitalist who will admit.  [CF]  2150 Updated patient and spouse.  [CF]  2150 Since IV Flagyl is on shortage, will give Zosyn instead of Cipro/Flagyl.  [CF]  2151 Called lab and asked them to add on the labs that were missed earlier as add-ons including magnesium and troponin.  [CF]    Clinical Course User Index [CF] Hinda Kehr, MD    Dg Chest 2 View  Result Date: 01/27/2016 CLINICAL DATA:  61 year old male with cough.  History of AFib. EXAM: CHEST  2 VIEW COMPARISON:  Chest CT dated 11/27/2008 FINDINGS: There are bibasilar hazy densities which may represent atelectatic changes versus infiltrate.  No significant pleural effusion or pneumothorax. The cardiac borders are not well visualized and silhouetted by the adjacent soft tissues. There is osteopenia with degenerative changes of the spine. There is compression fracture of a mid to lower thoracic vertebra with anterior wedging which is age indeterminate. There is also compression fracture of an upper lumbar vertebra with anterior wedging. Clinical correlation is recommended. IMPRESSION: Bibasilar hazy densities may represent atelectasis versus infiltrate. Clinical correlation is recommended. Osteopenia with age indeterminate compression deformities of a mid to lower thoracic and a lumbar vertebra. Correlation with clinical exam and point tenderness recommended. Electronically Signed   By: Anner Crete M.D.   On: 01/27/2016 21:21   Ct Abdomen Pelvis W Contrast  Result Date: 01/27/2016 CLINICAL DATA:  Three day history of abdominal pain. EXAM: CT ABDOMEN AND PELVIS WITH CONTRAST TECHNIQUE: Multidetector CT imaging of the abdomen and pelvis was performed using the standard protocol following bolus administration of intravenous contrast. CONTRAST:  12mL ISOVUE-300 IOPAMIDOL (ISOVUE-300) INJECTION 61% COMPARISON:  11/27/2008 FINDINGS: Lower chest: Compressive atelectasis noted lower lungs, right greater than left. Heart is enlarged with coronary artery calcification. Hepatobiliary: Geographic fatty deposition noted within the liver parenchyma. There is no evidence for gallstones, gallbladder wall thickening, or pericholecystic fluid. No intrahepatic or extrahepatic biliary dilation. Pancreas: No focal mass lesion. No dilatation of the main duct. No intraparenchymal cyst. No peripancreatic edema. Spleen: No splenomegaly. No focal mass lesion. Adrenals/Urinary Tract: No adrenal nodule or mass. Kidneys are unremarkable. No  evidence for hydroureter. The urinary bladder appears normal for the degree of distention. Stomach/Bowel: Small to moderate hiatal  hernia noted. Stomach otherwise unremarkable. Duodenum is normally positioned as is the ligament of Treitz. No small bowel wall thickening. No small bowel dilatation. The terminal ileum is normal. The appendix is normal. Prominent stool volume distal colon with large stool volume noted in the rectum. Question circumferential wall thickening in the distal most aspect of the rectum, but not a definite finding. Vascular/Lymphatic: There is abdominal aortic atherosclerosis without aneurysm. There is no gastrohepatic or hepatoduodenal ligament lymphadenopathy. No intraperitoneal or retroperitoneal lymphadenopathy. No pelvic sidewall lymphadenopathy. Reproductive: The prostate gland and seminal vesicles have normal imaging features. Other: No intraperitoneal free fluid. Musculoskeletal: There is some nonspecific perirectal edema with extraperitoneal mild edema seen in the anterior left pelvic sidewall, indeterminate. Compression fracture at L2 appears acute and results and 50-75% loss of height anteriorly with less than 25% loss of height posteriorly. Compression deformity at T9 is age-indeterminate. Old bilateral rib fractures are evident. IMPRESSION: 1. Prominent stool volume in the distal colon and rectum. There is some collapse crescent there is a question of circumferential wall thickening in the distal most portion of the rectum with mild perirectal edema. 2. Acute L2 compression fracture with age-indeterminate fracture seen at T9. 3. Geographic fatty deposition in the liver parenchyma. Electronically Signed   By: Misty Stanley M.D.   On: 01/27/2016 21:07      Hinda Kehr, MD 01/27/16 2151

## 2016-01-27 NOTE — ED Notes (Signed)
Pt states no bowel movement and abd pain.  Sx for 3 days.  No vomiting.   Pt tried laxatives today without relief.  Decreased appetite.  Pt reports drinking etoh everyday.  Pt alert.  Wife with pt.

## 2016-01-27 NOTE — ED Notes (Signed)
Report off to allison rn  

## 2016-01-27 NOTE — ED Notes (Signed)
Pt alert.  Family with pt.  meds infusing.  Pt watching tv.  Pt states abd feels better since disimpaction.

## 2016-01-27 NOTE — ED Provider Notes (Signed)
[facility] Emergency Department Provider Note   ____________________________________________   First MD Initiated Contact with Patient 01/27/16 [phone]     (approximate)  I have reviewed the triage vital signs and the nursing notes.   HISTORY  Chief Complaint Abdominal Pain and Constipation    HPI Charles Stanton is a 61 y.o. male here for evaluation of pain in the lower abdomen, also not eating well, and nausea starting today. Reports he has not had a bowel movement for about 3 weeks, he was able to eat and drink normally which is very small amounts until this morning when he began expansion nausea.  Currently describes a moderate to severe lower abdominal pain mostly on the left. Feels like he cannot pass stool through the rectum.History of constipation due to Parkinson's  No chest pain or shortness of breath. He does feel "dehydrated"  Past Medical History:  Diagnosis Date  . Hyperlipemia   . Hypertension   . Parkinson's disease [facility])     Patient Active Problem List   Diagnosis Date Noted  . Major depressive disorder, recurrent episode, moderate ([place]) 04/14/2015  . Anxiety, generalized 12/06/2013  . Parkinson's disease ([place]) 08/07/2013  . Open bite of lower leg 05/10/2013  . Abnormal finding on liver function 03/25/2013  . Benign neoplasm 03/25/2013  . Dizziness 03/25/2013  . Type 2 diabetes mellitus ([place]) 03/25/2013  . BP (high blood pressure) 03/25/2013  . Awareness of heartbeats 03/25/2013  . Pure hypercholesterolemia 03/25/2013  . Infective tonsillitis 03/25/2013    History reviewed. No pertinent surgical history.  Prior to Admission medications   Medication Sig Start Date End Date Taking? Authorizing Provider  carbidopa-levodopa (SINEMET IR) 25-100 MG per tablet Take 1 tablet by mouth 3 (three) times daily.    Historical Provider, MD  hydrochlorothiazide (HYDRODIURIL) 25 MG tablet Take 25 mg by mouth daily.    Historical Provider, MD   lisinopril (PRINIVIL,ZESTRIL) 20 MG tablet Take 20 mg by mouth 2 (two) times daily.    Historical Provider, MD  sertraline (ZOLOFT) 50 MG tablet Take 1 tablet (50 mg total) by mouth daily. 10/05/15   Rainey Pines, MD  simvastatin (ZOCOR) 20 MG tablet Take 20 mg by mouth daily. Reported on 04/14/2015    Historical Provider, MD  traZODone (DESYREL) 100 MG tablet Take 1 tablet (100 mg total) by mouth at bedtime. 10/05/15   Rainey Pines, MD  Vitamin D, Ergocalciferol, (DRISDOL) 50000 UNITS CAPS capsule Take 50,000 Units by mouth every 7 (seven) days.    Historical Provider, MD    Allergies Amantadine  Family History  Problem Relation Age of Onset  . Heart Problems Mother   . Dementia Mother     Social History Social History  Substance Use Topics  . Smoking status: Current Some Day Smoker  . Smokeless tobacco: Never Used  . Alcohol use Yes    Review of Systems Constitutional: No fever/chills Eyes: No visual changes. ENT: No sore throat.Feels dry Cardiovascular: Denies chest pain. Respiratory: Denies shortness of breath. Gastrointestinal: No vomiting.  No diarrhea.   Genitourinary: Negative for dysuria. Musculoskeletal: Negative for back pain. Skin: Negative for rash. Neurological: Negative for headaches, focal weakness or numbness.  10-point ROS otherwise negative.  ____________________________________________   PHYSICAL EXAM:  VITAL SIGNS: ED Triage Vitals  Enc Vitals Group     BP 01/27/16 1751 102/60     Pulse --      Resp 01/27/16 1751 18     Temp 01/27/16 1751 97.6  F (36.4 C)     Temp Source 01/27/16 1751 Oral     SpO2 01/27/16 1751 97 %     Weight 01/27/16 1751 150 lb (68 kg)     Height 01/27/16 1751 5\' 7"  (1.702 m)     Head Circumference --      Peak Flow --      Pain Score 01/27/16 1754 7     Pain Loc --      Pain Edu? --      Excl. in [place]? --     Constitutional: Alert and oriented. Appears fatigued, in no acute distress. Rather slow with his movements,  slight tremulousness noted in the right arm and right leg due to chronic Parkinson's per patient and his wife were both very pleasant. Eyes: Conjunctivae are normal. PERRL. EOMI. no jaundice Head: Atraumatic. Nose: No congestion/rhinnorhea. Mouth/Throat: Mucous membranes are moist.  Oropharynx non-erythematous. Neck: No stridor.   Cardiovascular: Normal rate, regular rhythm. Grossly normal heart sounds.  Good peripheral circulation. Respiratory: Normal respiratory effort.  No retractions. Lungs CTAB. Gastrointestinal: Soft and moderately tender in the lower abdomen with slight rebound tenderness primarily in the left lower quadrant. No guarding. Mild distention.  Rectal: Rectal exam demonstrates stool in the vault, somewhat hard and potentially obstructing with fecal impaction. Patient disimpacted for a small amount, with soft stool resulting thereafter. Patient continues to have pain and discomfort, and somewhat unclear if the patient truly has fecal impaction or just significant constipation. Musculoskeletal: No lower extremity tenderness nor edema.  No joint effusions. Neurologic:  Normal speech and language. No gross focal neurologic deficits are appreciated though right-sided, more notably lower extremity tremors present she is due to his "Parkinson's" per the patient.  Skin:  Skin is warm, dry and intact. No rash noted. Psychiatric: Mood and affect are normal. Speech and behavior are normal.  ____________________________________________   LABS (all labs ordered are listed, but only abnormal results are displayed)  Labs Reviewed  COMPREHENSIVE METABOLIC PANEL - Abnormal; Notable for the following:       Result Value   Sodium 131 (*)    Potassium 2.6 (*)    Chloride 83 (*)    CO2 33 (*)    Glucose, Bld 155 (*)    Albumin 3.4 (*)    AST 50 (*)    ALT 6 (*)    Alkaline Phosphatase 196 (*)    Total Bilirubin 1.7 (*)    All other components within normal limits  CBC - Abnormal;  Notable for the following:    WBC 14.1 (*)    RBC 2.90 (*)    Hemoglobin 8.6 (*)    HCT 26.2 (*)    RDW 22.6 (*)    Platelets 796 (*)    All other components within normal limits  LIPASE, BLOOD  URINALYSIS, COMPLETE (UACMP) WITH MICROSCOPIC  BASIC METABOLIC PANEL  MAGNESIUM  TROPONIN I   ____________________________________________  EKG  Reviewed and interpreted by me at [facility] rate 90 QRS 105 QTC prolonged at 5:30 Regular QRSs with a somewhat ill-defined underlying atrial rhythm, possibly fibrillation, flutter or multifocal though no irregularity Prolonged QT Nonspecific T-wave abnormalities in inferolateral distribution may be associated with ischemia, though felt more likely related to underlying electrolyte abnormality ____________________________________________  RADIOLOGY   ____________________________________________   PROCEDURES  Procedure(s) performed: None  Procedures  Critical Care performed: No  ____________________________________________   INITIAL IMPRESSION / ASSESSMENT AND PLAN / ED COURSE  Pertinent labs & imaging results that  were available during my care of the patient were reviewed by me and considered in my medical decision making (see chart for details).  Differential diagnosis includes but is not limited to, abdominal perforation, aortic dissection, cholecystitis, appendicitis, diverticulitis, colitis, esophagitis/gastritis, kidney stone, pyelonephritis, urinary tract infection, aortic aneurysm. All are considered in decision and treatment plan. Based upon the patient's presentation and risk factors, and little if any relief after disimpaction and concern for possible significant obstruction and/or potential other acute intra-abdominal process given his elevated white count, focal lower and left abdominal pain. We'll proceed with IV hydration, pain control, antiemetics, and IV fluid hydration  Of note, the patient also reports drinking 3-4  ounces of liquor daily for years. He drinks store-bought liquor, particularly bourbon. I discussed with him that his liver tests indicate damage that could be due to alcoholism/alcohol use, and he reports that he will agree to follow-up and also stop use of alcohol.  ----------------------------------------- 8:27 PM on 01/27/2016 -----------------------------------------  Discussed and reevaluated patient. Pain is improved, but he did vomit up his contrast. Reports he feels okay without ongoing nausea but his daughter is now present reports he's been having a cough for about 2 weeks as well. I discussed concerns regarding his EKG and a possible arrhythmia with his low potassium, patient is agreeable with the plan for admission to the hospital as well as his family  Ongoing care and disposition assigned to Dr. Karma Greaser, follow-up on CT scan and CXR for which I'm concerned about possible left lower quadrant pathology and/or obstruction. Thereafter, if no acute concern likely the patient would benefit from an enema if he shows signs of constipation on CT. In addition, the patient shows evidence of an new atrial arrhythmia and prolonged QT in association with hypokalemia I believe the patient requires admission for correction of his electrolytes and further workup regarding his cardiac arrhythmia as well, which I believe is associated.       ____________________________________________   FINAL CLINICAL IMPRESSION(S) / ED DIAGNOSES  Final diagnoses:  Nausea and vomiting, intractability of vomiting not specified, unspecified vomiting type  Hypokalemia  Constipation, unspecified constipation type  Parkinson's disease ([place])  Atrial arrhythmia  Prolonged Q-T interval on ECG  Generalized abdominal pain      NEW MEDICATIONS STARTED DURING THIS VISIT:  New Prescriptions   No medications on file     Note:  This document was prepared using Dragon voice recognition software and may include  unintentional dictation errors.     Delman Kitten, MD 01/27/16 2028

## 2016-01-28 ENCOUNTER — Inpatient Hospital Stay
Admit: 2016-01-28 | Discharge: 2016-01-28 | Disposition: A | Discharge status: Home / Self Care | Payer: Federal, State, Local not specified - PPO | Attending: Internal Medicine | Admitting: Internal Medicine

## 2016-01-28 DIAGNOSIS — D473 Essential (hemorrhagic) thrombocythemia: Secondary | ICD-10-CM

## 2016-01-28 DIAGNOSIS — R630 Anorexia: Secondary | ICD-10-CM

## 2016-01-28 DIAGNOSIS — I4891 Unspecified atrial fibrillation: Secondary | ICD-10-CM

## 2016-01-28 DIAGNOSIS — F17210 Nicotine dependence, cigarettes, uncomplicated: Secondary | ICD-10-CM

## 2016-01-28 DIAGNOSIS — F419 Anxiety disorder, unspecified: Secondary | ICD-10-CM

## 2016-01-28 DIAGNOSIS — R1310 Dysphagia, unspecified: Secondary | ICD-10-CM

## 2016-01-28 DIAGNOSIS — K529 Noninfective gastroenteritis and colitis, unspecified: Principal | ICD-10-CM

## 2016-01-28 DIAGNOSIS — Z86010 Personal history of colonic polyps: Secondary | ICD-10-CM

## 2016-01-28 DIAGNOSIS — I10 Essential (primary) hypertension: Secondary | ICD-10-CM

## 2016-01-28 DIAGNOSIS — Z8781 Personal history of (healed) traumatic fracture: Secondary | ICD-10-CM

## 2016-01-28 DIAGNOSIS — E785 Hyperlipidemia, unspecified: Secondary | ICD-10-CM

## 2016-01-28 DIAGNOSIS — K5090 Crohn's disease, unspecified, without complications: Secondary | ICD-10-CM

## 2016-01-28 DIAGNOSIS — K6389 Other specified diseases of intestine: Secondary | ICD-10-CM

## 2016-01-28 DIAGNOSIS — R5383 Other fatigue: Secondary | ICD-10-CM

## 2016-01-28 DIAGNOSIS — R002 Palpitations: Secondary | ICD-10-CM

## 2016-01-28 DIAGNOSIS — D649 Anemia, unspecified: Secondary | ICD-10-CM

## 2016-01-28 DIAGNOSIS — R748 Abnormal levels of other serum enzymes: Secondary | ICD-10-CM

## 2016-01-28 DIAGNOSIS — E860 Dehydration: Secondary | ICD-10-CM

## 2016-01-28 DIAGNOSIS — G20 Parkinson's disease: Secondary | ICD-10-CM

## 2016-01-28 LAB — GLUCOSE, CAPILLARY
GLUCOSE-CAPILLARY: 122 mg/dL — AB (ref 65–99)
GLUCOSE-CAPILLARY: 98 mg/dL (ref 65–99)
Glucose-Capillary: 116 mg/dL — ABNORMAL HIGH (ref 65–99)
Glucose-Capillary: 123 mg/dL — ABNORMAL HIGH (ref 65–99)
Glucose-Capillary: 84 mg/dL (ref 65–99)

## 2016-01-28 LAB — PROTIME-INR
INR: 1.19
Prothrombin Time: 15.2 seconds (ref 11.4–15.2)

## 2016-01-28 LAB — COMPREHENSIVE METABOLIC PANEL
ALBUMIN: 2.5 g/dL — AB (ref 3.5–5.0)
ALK PHOS: 143 U/L — AB (ref 38–126)
ALT: 6 U/L — AB (ref 17–63)
ANION GAP: 5 (ref 5–15)
AST: 29 U/L (ref 15–41)
BUN: 8 mg/dL (ref 6–20)
CALCIUM: 7.6 mg/dL — AB (ref 8.9–10.3)
CO2: 36 mmol/L — AB (ref 22–32)
Chloride: 94 mmol/L — ABNORMAL LOW (ref 101–111)
Creatinine, Ser: 0.47 mg/dL — ABNORMAL LOW (ref 0.61–1.24)
GFR calc Af Amer: >60 mL/min (ref >60)
GFR calc non Af Amer: >60 mL/min (ref >60)
GLUCOSE: 98 mg/dL (ref 65–99)
Potassium: 2.2 mmol/L — CL (ref 3.5–5.1)
SODIUM: 135 mmol/L (ref 135–145)
Total Bilirubin: 0.7 mg/dL (ref 0.3–1.2)
Total Protein: 4.9 g/dL — ABNORMAL LOW (ref 6.5–8.1)

## 2016-01-28 LAB — CBC
HCT: 18.9 % — ABNORMAL LOW (ref 40.0–52.0)
HCT: 18.8 % — ABNORMAL LOW (ref 40.0–52.0)
HEMOGLOBIN: 6.4 g/dL — AB (ref 13.0–18.0)
HEMOGLOBIN: 6.2 g/dL — AB (ref 13.0–18.0)
MCH: 30.5 pg (ref 26.0–34.0)
MCH: 29.8 pg (ref 26.0–34.0)
MCHC: 34.1 g/dL (ref 32.0–36.0)
MCHC: 32.7 g/dL (ref 32.0–36.0)
MCV: 89.5 fL (ref 80.0–100.0)
MCV: 91.2 fL (ref 80.0–100.0)
PLATELETS: 487 10*3/uL — AB (ref 150–440)
Platelets: 476 10*3/uL — ABNORMAL HIGH (ref 150–440)
RBC: 2.11 MIL/uL — AB (ref 4.40–5.90)
RBC: 2.07 MIL/uL — ABNORMAL LOW (ref 4.40–5.90)
RDW: 22.8 % — ABNORMAL HIGH (ref 11.5–14.5)
RDW: 22 % — AB (ref 11.5–14.5)
WBC: 10 10*3/uL (ref 3.8–10.6)
WBC: 9.1 10*3/uL (ref 3.8–10.6)

## 2016-01-28 LAB — BASIC METABOLIC PANEL
ANION GAP: 8 (ref 5–15)
BUN: 9 mg/dL (ref 6–20)
CALCIUM: 7.8 mg/dL — AB (ref 8.9–10.3)
CO2: 36 mmol/L — AB (ref 22–32)
CREATININE: 0.71 mg/dL (ref 0.61–1.24)
Chloride: 90 mmol/L — ABNORMAL LOW (ref 101–111)
GFR calc non Af Amer: >60 mL/min (ref >60)
Glucose, Bld: 98 mg/dL (ref 65–99)
Potassium: 2.1 mmol/L — CL (ref 3.5–5.1)
SODIUM: 134 mmol/L — AB (ref 135–145)

## 2016-01-28 LAB — IRON AND TIBC
Iron: 160 ug/dL (ref 45–182)
SATURATION RATIOS: 83 % — AB (ref 17.9–39.5)
TIBC: 193 ug/dL — ABNORMAL LOW (ref 250–450)
UIBC: 33 ug/dL

## 2016-01-28 LAB — PREPARE RBC (CROSSMATCH)

## 2016-01-28 LAB — RETICULOCYTES
RBC.: 2.21 MIL/uL — AB (ref 4.40–5.90)
RETIC COUNT ABSOLUTE: 50.8 10*3/uL (ref 19.0–183.0)
Retic Ct Pct: 2.3 % (ref 0.4–3.1)

## 2016-01-28 LAB — FERRITIN: Ferritin: 728 ng/mL — ABNORMAL HIGH (ref 24–336)

## 2016-01-28 LAB — ABO/RH: ABO/RH(D): B POS

## 2016-01-28 LAB — OCCULT BLOOD X 1 CARD TO LAB, STOOL: FECAL OCCULT BLD: NEGATIVE

## 2016-01-28 LAB — LACTATE DEHYDROGENASE: LDH: 142 U/L (ref 98–192)

## 2016-01-28 MED ORDER — INSULIN ASPART 100 UNIT/ML ~~LOC~~ SOLN: 0.0000 [IU] | Freq: Four times a day (QID) | SUBCUTANEOUS | Status: DC

## 2016-01-28 MED ORDER — LORAZEPAM 2 MG/ML IJ SOLN: 0.0000 mg | Freq: Two times a day (BID) | INTRAMUSCULAR | Status: DC

## 2016-01-28 MED ORDER — LACTULOSE 10 GM/15ML PO SOLN
20.0000 g | Freq: Two times a day (BID) | ORAL | Status: DC
  Administered 2016-01-28 (×2): 20 g via ORAL
  Filled 2016-01-28 (×3): qty 30

## 2016-01-28 MED ORDER — LISINOPRIL 20 MG PO TABS: 20.0000 mg | ORAL_TABLET | Freq: Two times a day (BID) | ORAL | Status: DC

## 2016-01-28 MED ORDER — SIMVASTATIN 20 MG PO TABS
20.0000 mg | ORAL_TABLET | Freq: Every day | ORAL | Status: DC
  Administered 2016-01-28 – 2016-01-31 (×4): 20 mg via ORAL
  Filled 2016-01-28 (×4): qty 1

## 2016-01-28 MED ORDER — OXYCODONE HCL 5 MG PO TABS
5.0000 mg | ORAL_TABLET | ORAL | Status: DC | PRN
  Administered 2016-01-29: 5 mg via ORAL
  Filled 2016-01-28: qty 1

## 2016-01-28 MED ORDER — CARBIDOPA-LEVODOPA 25-100 MG PO TABS
1.0000 | ORAL_TABLET | Freq: Three times a day (TID) | ORAL | Status: DC
  Administered 2016-01-28 – 2016-01-31 (×8): 1 via ORAL
  Filled 2016-01-28 (×8): qty 1

## 2016-01-28 MED ORDER — ADULT MULTIVITAMIN W/MINERALS CH
1.0000 | ORAL_TABLET | Freq: Every day | ORAL | Status: DC
  Administered 2016-01-28 – 2016-01-31 (×4): 1 via ORAL
  Filled 2016-01-28 (×4): qty 1

## 2016-01-28 MED ORDER — SODIUM CHLORIDE 0.9% FLUSH
3.0000 mL | Freq: Two times a day (BID) | INTRAVENOUS | Status: DC
  Administered 2016-01-28 – 2016-01-31 (×6): 3 mL via INTRAVENOUS

## 2016-01-28 MED ORDER — INFLUENZA VAC SPLIT QUAD 0.5 ML IM SUSY
0.5000 mL | PREFILLED_SYRINGE | INTRAMUSCULAR | Status: AC
  Administered 2016-01-29: 0.5 mL via INTRAMUSCULAR
  Filled 2016-01-28: qty 0.5

## 2016-01-28 MED ORDER — POTASSIUM CHLORIDE IN NACL 20-0.9 MEQ/L-% IV SOLN
INTRAVENOUS | Status: DC
  Administered 2016-01-28: 09:00:00 via INTRAVENOUS
  Administered 2016-01-28: 125 mL/h via INTRAVENOUS
  Administered 2016-01-29 – 2016-01-30 (×2): via INTRAVENOUS
  Filled 2016-01-28 (×8): qty 1000

## 2016-01-28 MED ORDER — ACETAMINOPHEN 325 MG PO TABS
650.0000 mg | ORAL_TABLET | Freq: Four times a day (QID) | ORAL | Status: DC | PRN
  Administered 2016-01-29: 650 mg via ORAL
  Filled 2016-01-28: qty 2

## 2016-01-28 MED ORDER — LORAZEPAM 2 MG/ML IJ SOLN: 0.0000 mg | Freq: Four times a day (QID) | INTRAMUSCULAR | Status: DC

## 2016-01-28 MED ORDER — LORAZEPAM 1 MG PO TABS
1.0000 mg | ORAL_TABLET | Freq: Four times a day (QID) | ORAL | Status: AC | PRN
  Administered 2016-01-28: 1 mg via ORAL
  Filled 2016-01-28: qty 1

## 2016-01-28 MED ORDER — MAGNESIUM SULFATE 4 GM/100ML IV SOLN
4.0000 g | Freq: Once | INTRAVENOUS | Status: AC
  Administered 2016-01-28: 4 g via INTRAVENOUS
  Filled 2016-01-28: qty 100

## 2016-01-28 MED ORDER — ENOXAPARIN SODIUM 40 MG/0.4ML ~~LOC~~ SOLN: 40.0000 mg | SUBCUTANEOUS | Status: DC

## 2016-01-28 MED ORDER — POTASSIUM CHLORIDE CRYS ER 20 MEQ PO TBCR
40.0000 meq | EXTENDED_RELEASE_TABLET | ORAL | Status: AC
  Administered 2016-01-28 – 2016-01-29 (×3): 40 meq via ORAL
  Filled 2016-01-28 (×3): qty 2

## 2016-01-28 MED ORDER — PROCHLORPERAZINE EDISYLATE 5 MG/ML IJ SOLN: 5.0000 mg | INTRAMUSCULAR | Status: DC | PRN

## 2016-01-28 MED ORDER — ACETAMINOPHEN 650 MG RE SUPP: 650.0000 mg | Freq: Four times a day (QID) | RECTAL | Status: DC | PRN

## 2016-01-28 MED ORDER — SODIUM CHLORIDE 0.9 % IV SOLN
30.0000 meq | Freq: Once | INTRAVENOUS | Status: AC
  Administered 2016-01-28: 30 meq via INTRAVENOUS
  Filled 2016-01-28: qty 15

## 2016-01-28 MED ORDER — SODIUM CHLORIDE 0.9 % IV SOLN: Freq: Once | INTRAVENOUS | Status: DC

## 2016-01-28 MED ORDER — FOLIC ACID 1 MG PO TABS
1.0000 mg | ORAL_TABLET | Freq: Every day | ORAL | Status: DC
  Administered 2016-01-28 – 2016-01-31 (×4): 1 mg via ORAL
  Filled 2016-01-28 (×4): qty 1

## 2016-01-28 MED ORDER — PIPERACILLIN-TAZOBACTAM 3.375 G IVPB
3.3750 g | Freq: Three times a day (TID) | INTRAVENOUS | Status: DC
  Administered 2016-01-28 – 2016-01-30 (×5): 3.375 g via INTRAVENOUS
  Filled 2016-01-28 (×5): qty 50

## 2016-01-28 MED ORDER — VITAMIN B-1 100 MG PO TABS
100.0000 mg | ORAL_TABLET | Freq: Every day | ORAL | Status: DC
  Administered 2016-01-28 – 2016-01-31 (×4): 100 mg via ORAL
  Filled 2016-01-28 (×4): qty 1

## 2016-01-28 MED ORDER — SERTRALINE HCL 50 MG PO TABS
50.0000 mg | ORAL_TABLET | Freq: Every day | ORAL | Status: DC
  Administered 2016-01-28 – 2016-01-31 (×4): 50 mg via ORAL
  Filled 2016-01-28 (×4): qty 1

## 2016-01-28 MED ORDER — THIAMINE HCL 100 MG/ML IJ SOLN
100.0000 mg | Freq: Every day | INTRAMUSCULAR | Status: DC
  Filled 2016-01-28: qty 2

## 2016-01-28 MED ORDER — SODIUM CHLORIDE 0.9 % IV SOLN
INTRAVENOUS | Status: DC
  Administered 2016-01-28: 02:00:00 via INTRAVENOUS

## 2016-01-28 MED ORDER — INSULIN ASPART 100 UNIT/ML ~~LOC~~ SOLN
0.0000 [IU] | Freq: Four times a day (QID) | SUBCUTANEOUS | Status: DC
  Administered 2016-01-28: 1 [IU] via SUBCUTANEOUS
  Filled 2016-01-28: qty 1

## 2016-01-28 MED ORDER — LORAZEPAM 2 MG/ML IJ SOLN: 1.0000 mg | Freq: Four times a day (QID) | INTRAMUSCULAR | Status: AC | PRN

## 2016-01-28 NOTE — ED Notes (Signed)
Report to Sabra Heck, RN, encouraged to call with any further questions.

## 2016-01-28 NOTE — Progress Notes (Signed)
[facility] NAME: Charles Stanton    MR#:  [id number]  DATE OF BIRTH:  Oct 09, 1955  SUBJECTIVE:  CHIEF COMPLAINT:   Chief Complaint  Patient presents with  . Abdominal Pain  . Constipation  Patient is a 61 year old Caucasian male with past medical history significant for history of hypertension, hyperlipidemia, Parkinson's disease, who presents to the hospital with complaints of abdominal pain, constipation, nausea. In emergency room, he was noted to be hypokalemic, had elevated white blood cell count, CT scan showed significant lower GI tract. Stool burden with some inflammation and bowel wall thickening distally, concerning for colitis. Patient was admitted to the hospital for further evaluation and treatment. He feels somewhat better today, after having numerous bowel movements. He denies any gastrointestinal bleeding. Hemoccult was negative. Hemoglobin level has dropped down from 8.6 on admission to 6.2 with rehydration. Current studies revealed elevated ferritin level and high saturation ratio, concerning for anemia of chronic disease.  Review of Systems  Constitutional: Negative for chills, fever and weight loss.  HENT: Negative for congestion.   Eyes: Negative for blurred vision and double vision.  Respiratory: Negative for cough, sputum production, shortness of breath and wheezing.   Cardiovascular: Negative for chest pain, palpitations, orthopnea, leg swelling and PND.  Gastrointestinal: Positive for abdominal pain and constipation. Negative for blood in stool, diarrhea, nausea and vomiting.  Genitourinary: Negative for dysuria, frequency, hematuria and urgency.  Musculoskeletal: Negative for falls.  Neurological: Negative for dizziness, tremors, focal weakness and headaches.  Endo/Heme/Allergies: Does not bruise/bleed easily.  Psychiatric/Behavioral: Negative for depression. The patient does not have insomnia.     VITAL  SIGNS: Blood pressure (!) 93/50, pulse 99, temperature 98.7 F (37.1 C), temperature source Oral, resp. rate (!) 22, height 5\' 4"  (1.626 m), weight 69.7 kg (153 lb 11.2 oz), SpO2 96 %.  PHYSICAL EXAMINATION:   GENERAL:  61 y.o.-year-old patient lying in the bed in moderate distress, weak, pale, prostrated look. Very low volume voice, slow to respond EYES: Pupils equal, round, reactive to light and accommodation. No scleral icterus. Extraocular muscles intact.  HEENT: Head atraumatic, normocephalic. Oropharynx and nasopharynx clear.  NECK:  Supple, no jugular venous distention. No thyroid enlargement, no tenderness.  LUNGS: Normal breath sounds bilaterally, no wheezing, rales,rhonchi or crepitation. No use of accessory muscles of respiration.  CARDIOVASCULAR: S1, S2 normal. No murmurs, rubs, or gallops.  ABDOMEN: Soft, mild tender in suprapubic area and bilateral lower quadrants, no rebound or guarding was noted, nondistended. Bowel sounds present. No organomegaly or mass.  EXTREMITIES: No pedal edema, cyanosis, or clubbing.  NEUROLOGIC: Cranial nerves II through XII are intact. Muscle strength 5/5 in all extremities. Sensation intact. Gait not checked.  PSYCHIATRIC: The patient is somnolent, able to open his eyes and converse, oriented x 3. Masklike face, low volume voice SKIN: No obvious rash, lesion, or ulcer.   ORDERS/RESULTS REVIEWED:   CBC  Recent Labs Lab 01/27/16 1751 01/28/16 0601 01/28/16 1504  WBC 14.1* 10.0 9.1  HGB 8.6* 6.4* 6.2*  HCT 26.2* 18.9* 18.8*  PLT 796* 487* 476*  MCV 90.4 89.5 91.2  MCH 29.6 30.5 29.8  MCHC 32.8 34.1 32.7  RDW 22.6* 22.8* 22.0*   ------------------------------------------------------------------------------------------------------------------  Chemistries   Recent Labs Lab 01/27/16 1751 01/27/16 2200 01/28/16 0601 01/28/16 1504  NA 131* 134* 134* 135  K 2.6* 5.2* 2.1* 2.2*  CL 83* 98* 90* 94*  CO2 33* 32 36* 36*  GLUCOSE 155* 91  98 98  BUN 10 9 9 8   CREATININE 0.83 0.62 0.71 0.47*  CALCIUM 9.0 6.7* 7.8* 7.6*  MG  --  1.6*  --   --   AST 50*  --   --  29  ALT 6*  --   --  6*  ALKPHOS 196*  --   --  143*  BILITOT 1.7*  --   --  0.7   ------------------------------------------------------------------------------------------------------------------ estimated creatinine clearance is 83.3 mL/min (by C-G formula based on SCr of 0.47 mg/dL (L)). ------------------------------------------------------------------------------------------------------------------ No results for input(s): TSH, T4TOTAL, T3FREE, THYROIDAB in the last 72 hours.  Invalid input(s): FREET3  Cardiac Enzymes  Recent Labs Lab 01/27/16 2200  TROPONINI <0.03   ------------------------------------------------------------------------------------------------------------------ Invalid input(s): POCBNP ---------------------------------------------------------------------------------------------------------------  RADIOLOGY: Dg Chest 2 View  Result Date: 01/27/2016 CLINICAL DATA:  60 year old male with cough.  History of AFib. EXAM: CHEST  2 VIEW COMPARISON:  Chest CT dated 11/27/2008 FINDINGS: There are bibasilar hazy densities which may represent atelectatic changes versus infiltrate. No significant pleural effusion or pneumothorax. The cardiac borders are not well visualized and silhouetted by the adjacent soft tissues. There is osteopenia with degenerative changes of the spine. There is compression fracture of a mid to lower thoracic vertebra with anterior wedging which is age indeterminate. There is also compression fracture of an upper lumbar vertebra with anterior wedging. Clinical correlation is recommended. IMPRESSION: Bibasilar hazy densities may represent atelectasis versus infiltrate. Clinical correlation is recommended. Osteopenia with age indeterminate compression deformities of a mid to lower thoracic and a lumbar vertebra. Correlation with  clinical exam and point tenderness recommended. Electronically Signed   By: Anner Crete M.D.   On: 01/27/2016 21:21   Ct Abdomen Pelvis W Contrast  Result Date: 01/27/2016 CLINICAL DATA:  Three day history of abdominal pain. EXAM: CT ABDOMEN AND PELVIS WITH CONTRAST TECHNIQUE: Multidetector CT imaging of the abdomen and pelvis was performed using the standard protocol following bolus administration of intravenous contrast. CONTRAST:  117mL ISOVUE-300 IOPAMIDOL (ISOVUE-300) INJECTION 61% COMPARISON:  11/27/2008 FINDINGS: Lower chest: Compressive atelectasis noted lower lungs, right greater than left. Heart is enlarged with coronary artery calcification. Hepatobiliary: Geographic fatty deposition noted within the liver parenchyma. There is no evidence for gallstones, gallbladder wall thickening, or pericholecystic fluid. No intrahepatic or extrahepatic biliary dilation. Pancreas: No focal mass lesion. No dilatation of the main duct. No intraparenchymal cyst. No peripancreatic edema. Spleen: No splenomegaly. No focal mass lesion. Adrenals/Urinary Tract: No adrenal nodule or mass. Kidneys are unremarkable. No evidence for hydroureter. The urinary bladder appears normal for the degree of distention. Stomach/Bowel: Small to moderate hiatal hernia noted. Stomach otherwise unremarkable. Duodenum is normally positioned as is the ligament of Treitz. No small bowel wall thickening. No small bowel dilatation. The terminal ileum is normal. The appendix is normal. Prominent stool volume distal colon with large stool volume noted in the rectum. Question circumferential wall thickening in the distal most aspect of the rectum, but not a definite finding. Vascular/Lymphatic: There is abdominal aortic atherosclerosis without aneurysm. There is no gastrohepatic or hepatoduodenal ligament lymphadenopathy. No intraperitoneal or retroperitoneal lymphadenopathy. No pelvic sidewall lymphadenopathy. Reproductive: The prostate gland  and seminal vesicles have normal imaging features. Other: No intraperitoneal free fluid. Musculoskeletal: There is some nonspecific perirectal edema with extraperitoneal mild edema seen in the anterior left pelvic sidewall, indeterminate. Compression fracture at L2 appears acute and results and 50-75% loss of height anteriorly with less than 25% loss of height posteriorly. Compression deformity at T9 is age-indeterminate. Old  bilateral rib fractures are evident. IMPRESSION: 1. Prominent stool volume in the distal colon and rectum. There is some collapse crescent there is a question of circumferential wall thickening in the distal most portion of the rectum with mild perirectal edema. 2. Acute L2 compression fracture with age-indeterminate fracture seen at T9. 3. Geographic fatty deposition in the liver parenchyma. Electronically Signed   By: Misty Stanley M.D.   On: 01/27/2016 21:07    EKG:  Orders placed or performed during the hospital encounter of 01/27/16  . EKG 12-Lead  . EKG 12-Lead    ASSESSMENT AND PLAN:  Principal Problem:   Colitis Active Problems:   Type 2 diabetes mellitus (HCC)   BP (high blood pressure)   Parkinson's disease (HCC)   Atrial fibrillation (HCC)   HLD (hyperlipidemia)   Anxiety   Hypokalemia  #1. Severe anemia of chronic disease of unclear etiology at this time, symptomatic with hypotension, transfuse the  Patient with PRBC, get oncologist to see patient in consultation, Hemoccult was negative, no bleeding noted, getting additional studies, bilirubin level is low, hemolysis is unlikely, check haptoglobin, LDH, reticulocyte count #2. Severe constipation with stercoral colitis versus other reason, patient would benefit from gastroenterologist evaluation and  colonoscopy as outpatient, constipation has resolved #3. Hypotension, questionably due to anemia, hypoalbuminemia, infection, continue IV fluids and hydrate, transfuse patient #4. Hypokalemia, supplementing  intravenously and orally, supplementing magnesium level as well, recheck tomorrow morning #5. Colitis, questionable stercoral colitis versus nonspecific inflammation versus cancerous condition, continue antibiotic therapy for now, follow clinically #6. Thrombocytosis of unclear etiology, oncology consultation is requested, getting pro time INR, reticulocyte count, LDH, haptoglobin, although bilirubin level is low, unlikely hemolysis   Management plans discussed with the patient, family and they are in agreement.   DRUG ALLERGIES:  Allergies  Allergen Reactions  . Amantadine Other (See Comments)    fatigue sleepy    CODE STATUS:     Code Status Orders        Start     Ordered   01/28/16 0123  Full code  Continuous     01/28/16 0122    Code Status History    Date Active Date Inactive Code Status Order ID Comments User Context   This patient has a current code status but no historical code status.      TOTAL TIME TAKING CARE OF THIS PATIENT: 40 minutes.    Theodoro Grist M.D on 01/28/2016 at 4:17 PM  Between 7am to 6pm - Pager - [phone]  After 6pm go to [email or web address] - password [email or web address] [facility]  [facility]  Office  [phone]  CC: Primary care physician; No PCP Per Patient

## 2016-01-28 NOTE — Progress Notes (Signed)
PT Cancellation Note  Patient Details Name: Charles Stanton MRN: [id number] DOB: 1955/02/12   Cancelled Treatment:    Reason Eval/Treat Not Completed: Medical issues which prohibited therapy Pt's hemoglobin is 6.4 this AM, will hold PT until pt has more appropriate blood levels.   Kreg Shropshire, DPT 01/28/2016, 11:47 AM

## 2016-01-28 NOTE — Progress Notes (Signed)
Pharmacy Antibiotic Note  Charles Stanton is a 61 y.o. male admitted on 01/27/2016 with intra-abdominal infection.  Pharmacy has been consulted for meropenem dosing.  Plan: Meropenem 1 gram q 8 hours will be transitioned to piperacillin/tazobactam 3.375 g IV q8h  Height: 5\' 4"  (162.6 cm) Weight: 153 lb 11.2 oz (69.7 kg) IBW/kg (Calculated) : 59.2  Temp (24hrs), Avg:98 F (36.7 C), Min:97.6 F (36.4 C), Max:98.7 F (37.1 C)   Recent Labs Lab 01/27/16 1751 01/27/16 2200 01/28/16 0601  WBC 14.1*  --  10.0  CREATININE 0.83 0.62 0.71    Estimated Creatinine Clearance: 83.3 mL/min (by C-G formula based on SCr of 0.71 mg/dL).    Allergies  Allergen Reactions  . Amantadine Other (See Comments)    fatigue sleepy    Antimicrobials this admission: Zosyn x1  >>  meropenem 1/25 >> 1/25 Piperacillin/tazobactam 1/25 >>  Dose adjustments this admission:  Microbiology results: No micro  1/24 UA: (-) 1/24 CXR: atelectasis vs. infiltrate  Thank you for allowing pharmacy to be a part of this patient's care.  Darrow Bussing, PharmD Pharmacy Resident 01/28/2016 3:06 PM

## 2016-01-28 NOTE — Progress Notes (Signed)
Patient declines to be transfused today d/t significant fatigue and wanting only to rest for the rest of the day. New CBC results show both RBC's & H&H have remained low but have not significantly fallen once again. Patient states if tomorrow AM's blood work does not show H&H improvement to at least the transfuse line (7.0), he is willing to proceed with 1 - 2 units of RBC's throughout the day tomorrow. Will continue to monitor for any additional S&S of bleeding for remainder of shift. Wenda Low [facility]

## 2016-01-28 NOTE — Consult Note (Signed)
[facility]  Date of admission:  01/27/2016  Inpatient day:  01/28/2016  Consulting physician: Dr. Theodoro Grist   Reason for Consultation:  ? Anemia of chronic disease  Chief Complaint: Charles Stanton is a 61 y.o. male with parkinson's who was admitted through the emergency room with abdominal pain secondary to colitis and new onset atrial fibrillation.  HPI:  The patient was diagnosed with Parkinson's 1-2 years ago.  He typically has yearly blood work.  Prior to this admission he was never aware of a diagnosis of anemia.  He denies any melena, hematochezia or hematuria.  His last colonoscopy was approximately 5 years ago.  He states that he is late for his colonoscopy.  Last colonoscopy was > 5 years ago and revealed polyps.  He notes a history of ulcers caused by aspirin use in 2015.  His diet is described as horrible for at least 3-4 weeks.  He drinks muscle milk for breakfast.  For the past few weeks, he has eaten a few bites of food.  He tends to eat soft foods and soups.  He has had issues with his teeth and a partial denture.  His daughter notes that he has had some difficulty with swallowing.  He notes a 1-10 pound weight loss.  He has been on no new medication or herbal product.  ER notes daily alcohol.    He was seen in the emergency room for a 3 day history of abdominal pain, constipation, and dehydration.  Abdominal and pelvic CT scan revealed prominent stool volume in the distal colon and rectum. There was questionable circumferential wall thickening in the distal most portion of the rectum with mild perirectal edema.  There was an acute L2 compression fracture with age-indeterminate fracture seen at T9.  Labs included a hematocrit of 26.2, hemoglobin 8.6, MCV 90.4, platelets 796,000, white count 14,100.  Creatinine was 0.62, potassium 5.2, and calcium 6.7.  Urinalysis revealed no hematuria or elevated bilirubin.    Labs today included a ferritin of 728, iron  saturation 83%, and TIBC 193.  Alkaline phosphatase 143 (elevated).  CBC revealed a hematocrit of 18.8, hemoglobin 6.2, and platelets 476,000, and WBC 9100.  Stool guaiac was negative.  He was started on fluids and Zosyn.   Past Medical History:  Diagnosis Date  . Anxiety   . Hyperlipemia   . Hypertension   . Palpitations   . Parkinson's disease [facility])     Past Surgical History:  Procedure Laterality Date  . TONSILLECTOMY      Family History  Problem Relation Age of Onset  . Heart Problems Mother   . Dementia Mother     Social History:  reports that he has been smoking.  He has never used smokeless tobacco. He reports that he drinks alcohol. He reports that he does not use drugs.  He is accompanied by his wife and daughter.  Allergies:  Allergies  Allergen Reactions  . Amantadine Other (See Comments)    fatigue sleepy    Medications Prior to Admission  Medication Sig Dispense Refill  . sertraline (ZOLOFT) 50 MG tablet Take 1 tablet (50 mg total) by mouth daily. 90 tablet 1  . traZODone (DESYREL) 100 MG tablet Take 1 tablet (100 mg total) by mouth at bedtime. 90 tablet 1  . carbidopa-levodopa (SINEMET IR) 25-100 MG per tablet Take 1 tablet by mouth 3 (three) times daily.    . hydrochlorothiazide (HYDRODIURIL) 25 MG tablet Take 25 mg by mouth daily.    Marland Kitchen  lisinopril (PRINIVIL,ZESTRIL) 20 MG tablet Take 20 mg by mouth 2 (two) times daily.    . simvastatin (ZOCOR) 20 MG tablet Take 20 mg by mouth daily. Reported on 04/14/2015    . Vitamin D, Ergocalciferol, (DRISDOL) 50000 UNITS CAPS capsule Take 50,000 Units by mouth every 7 (seven) days.      Review of Systems: GENERAL:  Fatigue x 2 weeks.  No fevers or sweats.  Weight loss of 1-9 pounds. PERFORMANCE STATUS (ECOG):  2 HEENT:  Needs new glasses.  Denture issues.  No visual changes, runny nose, sore throat, mouth sores or tenderness. Lungs: No shortness of breath or cough.  No hemoptysis. Cardiac:  No chest pain,  palpitations, orthopnea, or PND. GI:  Abdominal pain.  Difficulty swallowing.  Constipation.  No nausea, vomiting, diarrhea, melena or hematochezia. History of polyps (late on colonoscopy). GU:  No urgency, frequency, dysuria, or hematuria. Musculoskeletal:  No back pain.  No joint pain.  No muscle tenderness. Extremities:  No pain or swelling. Skin:  No rashes or skin changes. Neuro:  Parkinson's disease.  Dizzy.  No headache, numbness or weakness, balance or coordination issues. Endocrine:  No diabetes, thyroid issues, hot flashes or night sweats. Psych:  Anxiety.  No mood changes, depression. Pain:  No focal pain. Review of systems:  All other systems reviewed and found to be negative.  Physical Exam:  Blood pressure (!) 93/50, pulse 99, temperature 98.7 F (37.1 C), temperature source Oral, resp. rate (!) 22, height _0  (1.626 m), weight 153 lb 11.2 oz (69.7 kg), SpO2 96 %.  GENERAL:  Well developed, well nourished, gentleman sitting comfortably on the medical unit in no acute distress. MENTAL STATUS:  Alert and oriented to person, place and time. HEAD:  Pearline Cables hair and beard.  Normocephalic, atraumatic, face symmetric, no Cushingoid features. EYES:  Brown eyes.  Pupils equal round and reactive to light and accomodation.  No conjunctivitis or scleral icterus. ENT:  Oropharynx clear without lesion.  Tongue normal.  Poor dentition.  Mucous membranes moist.  RESPIRATORY:  Clear to auscultation without rales, wheezes or rhonchi. CARDIOVASCULAR:  Regular rate and rhythm without murmur, rub or gallop. ABDOMEN:  Soft, slightly tender in epigastric region without guarding or rebound tenderness.  Active bowel sounds, and no hepatosplenomegaly.  No masses. SKIN:  No rashes, ulcers or lesions. EXTREMITIES: Thin legs.  No edema, no skin discoloration or tenderness.  No palpable cords. LYMPH NODES: No palpable cervical, supraclavicular, axillary or inguinal adenopathy  NEUROLOGICAL:  Unremarkable. PSYCH:  Appropriate.  Results for orders placed or performed during the hospital encounter of 01/27/16 (from the past 48 hour(s))  Lipase, blood     Status: None   Collection Time: 01/27/16  5:51 PM  Result Value Ref Range   Lipase 30 11 - 51 U/L  Comprehensive metabolic panel     Status: Abnormal   Collection Time: 01/27/16  5:51 PM  Result Value Ref Range   Sodium 131 (L) 135 - 145 mmol/L   Potassium 2.6 (LL) 3.5 - 5.1 mmol/L    Comment: CRITICAL RESULT CALLED TO, READ BACK BY AND VERIFIED WITH KENDALL MOFFITT 01/27/16 @ 1832  MLK    Chloride 83 (L) 101 - 111 mmol/L   CO2 33 (H) 22 - 32 mmol/L   Glucose, Bld 155 (H) 65 - 99 mg/dL   BUN 10 6 - 20 mg/dL   Creatinine, Ser 0.83 0.61 - 1.24 mg/dL   Calcium 9.0 8.9 - 10.3 mg/dL   Total  Protein 7.2 6.5 - 8.1 g/dL   Albumin 3.4 (L) 3.5 - 5.0 g/dL   AST 50 (H) 15 - 41 U/L   ALT 6 (L) 17 - 63 U/L   Alkaline Phosphatase 196 (H) 38 - 126 U/L   Total Bilirubin 1.7 (H) 0.3 - 1.2 mg/dL   GFR calc non Af Amer >60 >60 mL/min   GFR calc Af Amer >60 >60 mL/min    Comment: (NOTE) The eGFR has been calculated using the CKD EPI equation. This calculation has not been validated in all clinical situations. eGFR's persistently <60 mL/min signify possible Chronic Kidney Disease.    Anion gap 15 5 - 15  CBC     Status: Abnormal   Collection Time: 01/27/16  5:51 PM  Result Value Ref Range   WBC 14.1 (H) 3.8 - 10.6 K/uL   RBC 2.90 (L) 4.40 - 5.90 MIL/uL   Hemoglobin 8.6 (L) 13.0 - 18.0 g/dL   HCT 26.2 (L) 40.0 - 52.0 %   MCV 90.4 80.0 - 100.0 fL   MCH 29.6 26.0 - 34.0 pg   MCHC 32.8 32.0 - 36.0 g/dL   RDW 22.6 (H) 11.5 - 14.5 %   Platelets 796 (H) 150 - 440 K/uL  Basic metabolic panel     Status: Abnormal   Collection Time: 01/27/16 10:00 PM  Result Value Ref Range   Sodium 134 (L) 135 - 145 mmol/L   Potassium 5.2 (H) 3.5 - 5.1 mmol/L   Chloride 98 (L) 101 - 111 mmol/L   CO2 32 22 - 32 mmol/L   Glucose, Bld 91 65 - 99 mg/dL    BUN 9 6 - 20 mg/dL   Creatinine, Ser 0.62 0.61 - 1.24 mg/dL   Calcium 6.7 (L) 8.9 - 10.3 mg/dL   GFR calc non Af Amer >60 >60 mL/min   GFR calc Af Amer >60 >60 mL/min    Comment: (NOTE) The eGFR has been calculated using the CKD EPI equation. This calculation has not been validated in all clinical situations. eGFR's persistently <60 mL/min signify possible Chronic Kidney Disease.    Anion gap 4 (L) 5 - 15  Magnesium     Status: Abnormal   Collection Time: 01/27/16 10:00 PM  Result Value Ref Range   Magnesium 1.6 (L) 1.7 - 2.4 mg/dL  Troponin I     Status: None   Collection Time: 01/27/16 10:00 PM  Result Value Ref Range   Troponin I <0.03 <0.03 ng/mL  Urinalysis, Complete w Microscopic     Status: Abnormal   Collection Time: 01/27/16 10:02 PM  Result Value Ref Range   Color, Urine YELLOW (A) YELLOW   APPearance CLEAR (A) CLEAR   Specific Gravity, Urine 1.027 1.005 - 1.030   pH 6.0 5.0 - 8.0   Glucose, UA NEGATIVE NEGATIVE mg/dL   Hgb urine dipstick NEGATIVE NEGATIVE   Bilirubin Urine NEGATIVE NEGATIVE   Ketones, ur NEGATIVE NEGATIVE mg/dL   Protein, ur NEGATIVE NEGATIVE mg/dL   Nitrite NEGATIVE NEGATIVE   Leukocytes, UA NEGATIVE NEGATIVE   RBC / HPF 0-5 0 - 5 RBC/hpf   WBC, UA 0-5 0 - 5 WBC/hpf   Bacteria, UA NONE SEEN NONE SEEN   Squamous Epithelial / LPF NONE SEEN NONE SEEN  Glucose, capillary     Status: Abnormal   Collection Time: 01/28/16  2:00 AM  Result Value Ref Range   Glucose-Capillary 122 (H) 65 - 99 mg/dL   Comment 1 Notify RN  Glucose, capillary     Status: None   Collection Time: 01/28/16  6:00 AM  Result Value Ref Range   Glucose-Capillary 98 65 - 99 mg/dL   Comment 1 Notify RN   Basic metabolic panel     Status: Abnormal   Collection Time: 01/28/16  6:01 AM  Result Value Ref Range   Sodium 134 (L) 135 - 145 mmol/L   Potassium 2.1 (LL) 3.5 - 5.1 mmol/L    Comment: CRITICAL RESULT CALLED TO, READ BACK BY AND VERIFIED WITH MARCEL TURNER AT 7510  ON 01/28/16 [place].    Chloride 90 (L) 101 - 111 mmol/L   CO2 36 (H) 22 - 32 mmol/L   Glucose, Bld 98 65 - 99 mg/dL   BUN 9 6 - 20 mg/dL   Creatinine, Ser 0.71 0.61 - 1.24 mg/dL   Calcium 7.8 (L) 8.9 - 10.3 mg/dL   GFR calc non Af Amer >60 >60 mL/min   GFR calc Af Amer >60 >60 mL/min    Comment: (NOTE) The eGFR has been calculated using the CKD EPI equation. This calculation has not been validated in all clinical situations. eGFR's persistently <60 mL/min signify possible Chronic Kidney Disease.    Anion gap 8 5 - 15  CBC     Status: Abnormal   Collection Time: 01/28/16  6:01 AM  Result Value Ref Range   WBC 10.0 3.8 - 10.6 K/uL   RBC 2.11 (L) 4.40 - 5.90 MIL/uL   Hemoglobin 6.4 (L) 13.0 - 18.0 g/dL   HCT 18.9 (L) 40.0 - 52.0 %   MCV 89.5 80.0 - 100.0 fL   MCH 30.5 26.0 - 34.0 pg   MCHC 34.1 32.0 - 36.0 g/dL   RDW 22.8 (H) 11.5 - 14.5 %   Platelets 487 (H) 150 - 440 K/uL  Ferritin     Status: Abnormal   Collection Time: 01/28/16  6:01 AM  Result Value Ref Range   Ferritin 728 (H) 24 - 336 ng/mL  Iron and TIBC     Status: Abnormal   Collection Time: 01/28/16  6:01 AM  Result Value Ref Range   Iron 160 45 - 182 ug/dL   TIBC 193 (L) 250 - 450 ug/dL   Saturation Ratios 83 (H) 17.9 - 39.5 %   UIBC 33 ug/dL  Occult blood card to lab, stool     Status: None   Collection Time: 01/28/16 10:18 AM  Result Value Ref Range   Fecal Occult Bld NEGATIVE NEGATIVE  Glucose, capillary     Status: Abnormal   Collection Time: 01/28/16 11:37 AM  Result Value Ref Range   Glucose-Capillary 116 (H) 65 - 99 mg/dL  Comprehensive metabolic panel     Status: Abnormal   Collection Time: 01/28/16  3:04 PM  Result Value Ref Range   Sodium 135 135 - 145 mmol/L   Potassium 2.2 (LL) 3.5 - 5.1 mmol/L    Comment: CRITICAL RESULT CALLED TO, READ BACK BY AND VERIFIED WITH [facility] RN AT [id number] 01/28/16 MSS.    Chloride 94 (L) 101 - 111 mmol/L   CO2 36 (H) 22 - 32 mmol/L   Glucose, Bld 98 65 -  99 mg/dL   BUN 8 6 - 20 mg/dL   Creatinine, Ser 0.47 (L) 0.61 - 1.24 mg/dL   Calcium 7.6 (L) 8.9 - 10.3 mg/dL   Total Protein 4.9 (L) 6.5 - 8.1 g/dL   Albumin 2.5 (L) 3.5 - 5.0 g/dL   AST 29  15 - 41 U/L   ALT 6 (L) 17 - 63 U/L   Alkaline Phosphatase 143 (H) 38 - 126 U/L   Total Bilirubin 0.7 0.3 - 1.2 mg/dL   GFR calc non Af Amer >60 >60 mL/min   GFR calc Af Amer >60 >60 mL/min    Comment: (NOTE) The eGFR has been calculated using the CKD EPI equation. This calculation has not been validated in all clinical situations. eGFR's persistently <60 mL/min signify possible Chronic Kidney Disease.    Anion gap 5 5 - 15  CBC     Status: Abnormal   Collection Time: 01/28/16  3:04 PM  Result Value Ref Range   WBC 9.1 3.8 - 10.6 K/uL   RBC 2.07 (L) 4.40 - 5.90 MIL/uL   Hemoglobin 6.2 (L) 13.0 - 18.0 g/dL   HCT 18.8 (L) 40.0 - 52.0 %   MCV 91.2 80.0 - 100.0 fL   MCH 29.8 26.0 - 34.0 pg   MCHC 32.7 32.0 - 36.0 g/dL   RDW 22.0 (H) 11.5 - 14.5 %   Platelets 476 (H) 150 - 440 K/uL  Type and screen [place]     Status: None (Preliminary result)   Collection Time: 01/28/16  4:24 PM  Result Value Ref Range   Blood Product Unit Number [id number]    Unit Type and Rh 7300    Blood Product Expiration Date 119147829562    Blood Product Unit Number [id number]    Unit Type and Rh 7300    Blood Product Expiration Date [id number]   Prepare RBC     Status: None   Collection Time: 01/28/16  4:24 PM  Result Value Ref Range   Order Confirmation ORDER PROCESSED BY BLOOD BANK   ABO/Rh     Status: None   Collection Time: 01/28/16  4:33 PM  Result Value Ref Range   ABO/RH(D) B POS   Protime-INR     Status: None   Collection Time: 01/28/16  4:33 PM  Result Value Ref Range   Prothrombin Time 15.2 11.4 - 15.2 seconds   INR 1.19   Lactate dehydrogenase     Status: None   Collection Time: 01/28/16  4:33 PM  Result Value Ref Range   LDH 142 98 - 192 U/L  Reticulocytes      Status: Abnormal   Collection Time: 01/28/16  4:33 PM  Result Value Ref Range   Retic Ct Pct 2.3 0.4 - 3.1 %   RBC. 2.21 (L) 4.40 - 5.90 MIL/uL   Retic Count, Manual 50.8 19.0 - 183.0 K/uL  Glucose, capillary     Status: Abnormal   Collection Time: 01/28/16  6:23 PM  Result Value Ref Range   Glucose-Capillary 123 (H) 65 - 99 mg/dL   Dg Chest 2 View  Result Date: 01/27/2016 CLINICAL DATA:  61 year old male with cough.  History of AFib. EXAM: CHEST  2 VIEW COMPARISON:  Chest CT dated 11/27/2008 FINDINGS: There are bibasilar hazy densities which may represent atelectatic changes versus infiltrate. No significant pleural effusion or pneumothorax. The cardiac borders are not well visualized and silhouetted by the adjacent soft tissues. There is osteopenia with degenerative changes of the spine. There is compression fracture of a mid to lower thoracic vertebra with anterior wedging which is age indeterminate. There is also compression fracture of an upper lumbar vertebra with anterior wedging. Clinical correlation is recommended. IMPRESSION: Bibasilar hazy densities may represent atelectasis versus infiltrate. Clinical correlation is recommended. Osteopenia with age  indeterminate compression deformities of a mid to lower thoracic and a lumbar vertebra. Correlation with clinical exam and point tenderness recommended. Electronically Signed   By: Anner Crete M.D.   On: 01/27/2016 21:21   Ct Abdomen Pelvis W Contrast  Result Date: 01/27/2016 CLINICAL DATA:  Three day history of abdominal pain. EXAM: CT ABDOMEN AND PELVIS WITH CONTRAST TECHNIQUE: Multidetector CT imaging of the abdomen and pelvis was performed using the standard protocol following bolus administration of intravenous contrast. CONTRAST:  13m ISOVUE-300 IOPAMIDOL (ISOVUE-300) INJECTION 61% COMPARISON:  11/27/2008 FINDINGS: Lower chest: Compressive atelectasis noted lower lungs, right greater than left. Heart is enlarged with coronary  artery calcification. Hepatobiliary: Geographic fatty deposition noted within the liver parenchyma. There is no evidence for gallstones, gallbladder wall thickening, or pericholecystic fluid. No intrahepatic or extrahepatic biliary dilation. Pancreas: No focal mass lesion. No dilatation of the main duct. No intraparenchymal cyst. No peripancreatic edema. Spleen: No splenomegaly. No focal mass lesion. Adrenals/Urinary Tract: No adrenal nodule or mass. Kidneys are unremarkable. No evidence for hydroureter. The urinary bladder appears normal for the degree of distention. Stomach/Bowel: Small to moderate hiatal hernia noted. Stomach otherwise unremarkable. Duodenum is normally positioned as is the ligament of Treitz. No small bowel wall thickening. No small bowel dilatation. The terminal ileum is normal. The appendix is normal. Prominent stool volume distal colon with large stool volume noted in the rectum. Question circumferential wall thickening in the distal most aspect of the rectum, but not a definite finding. Vascular/Lymphatic: There is abdominal aortic atherosclerosis without aneurysm. There is no gastrohepatic or hepatoduodenal ligament lymphadenopathy. No intraperitoneal or retroperitoneal lymphadenopathy. No pelvic sidewall lymphadenopathy. Reproductive: The prostate gland and seminal vesicles have normal imaging features. Other: No intraperitoneal free fluid. Musculoskeletal: There is some nonspecific perirectal edema with extraperitoneal mild edema seen in the anterior left pelvic sidewall, indeterminate. Compression fracture at L2 appears acute and results and 50-75% loss of height anteriorly with less than 25% loss of height posteriorly. Compression deformity at T9 is age-indeterminate. Old bilateral rib fractures are evident. IMPRESSION: 1. Prominent stool volume in the distal colon and rectum. There is some collapse crescent there is a question of circumferential wall thickening in the distal most  portion of the rectum with mild perirectal edema. 2. Acute L2 compression fracture with age-indeterminate fracture seen at T9. 3. Geographic fatty deposition in the liver parenchyma. Electronically Signed   By: EMisty StanleyM.D.   On: 01/27/2016 21:07    Assessment:  The patient is a 61y.o. gentleman with Parkinson's disease who was admitted with abdominal pain and constipation.  CT scans reveal questionable thickening in the distal portion of the rectum.    He denies any prior history of anemia.  Initial CBC prior to hydration revealed a normocytic anemia with a hematocrit of 26.2 and hemoglobin 8.6 which has decreased to a hematocrit of 18.8 and hemoglobin 6.2.  Platelet count has been elevated likely secondary to inflammation and anemia.  Additional labs revealed an elevated ferritin (real or acute phase reactant) and elevated iron saturation (83%).  No history of hemochromatosis.  Alkaline phosphatase is mild elevated.  He has a history of daily alcohol use.  Diet is poor.  He has a history of colonic polyps.  He is late for his follow-up colonoscopy.  Stool guaiac was negative. .   Plan:   1.  Hematology:  Suspect etiology of anemia is multi-factorial.  Diet is poor.  Daily alcohol intake.  No current evidence of GI  bleeding.  Guaiac all stools.  Patient may need an upper endoscopy and follow-up colonoscopy.  No current evidence of hemolysis.  Patient receiving folic acid.  Check CBC daily.  Check retic, haptoglobin, Coombs, ESR, SPEP, B12, TSH.  Transfuse PRBCs as needed.  Follow-up iron saturation and ferritin in outpatient department.  May need to rule out hemochromatosis if remains elevated.  Platelet count is improving as likely acute phase reactant.  Thank you for allowing me to participate in Charles Stanton 's care.  I will follow him closely with you while hospitalized and after discharge in the outpatient department.  I will be out of the office tomorrow.  Dr. Delight Hoh will  be covering in my absence.   Lequita Asal, MD  01/28/2016, 8:07 PM

## 2016-01-28 NOTE — Progress Notes (Signed)
Patient impulsive with getting out of bed to have bowel movement.  Small soft pasty stools passed each occurrence.  Tremors to hand noted.  History of Parkinsons but patient admits to drinking daily with last alcohol consumption 3 days ago.  Requesting "help for my drinking".  MD made aware and CIWA initiated.

## 2016-01-28 NOTE — Progress Notes (Signed)
*  PRELIMINARY RESULTS* Echocardiogram 2D Echocardiogram has been performed.  Charles Stanton 01/28/2016, 5:28 PM

## 2016-01-28 NOTE — Progress Notes (Signed)
Patient's wife wants blood admin to be initiated at midnight tonight so "patient can get more rest before then." Daron Offer

## 2016-01-28 NOTE — Consult Note (Signed)
[facility]  CARDIOLOGY CONSULT NOTE  Patient ID: Charles Stanton MRN: [id number] DOB/AGE: 1955/05/02 61 y.o.  Admit date: 01/27/2016 Referring Physician   Primary Physician   Primary Cardiologist   Reason for Consultation afib  HPI: Pt is a 61 yo male with parkinsons disease admitted with abnormial pain, hypokalemia and colitis and felt to have new onset afib. His serum potassium was abnormal at 2.1. He was noted to be anemic at 6.2. He was felt to be in afib. Review of ekg and tele strips does not reveal significant episode of afib. He has sr and sinus arrhythmia. He remains significantly hypokalemic at 2.2. He has ruled out for mi and denies chest pain.   Review of Systems  HENT: Negative.   Eyes: Negative.   Respiratory: Negative.   Cardiovascular: Negative.   Gastrointestinal: Positive for abdominal pain and constipation.  Genitourinary: Negative.   Musculoskeletal: Negative.   Skin: Negative.   Neurological: Positive for tremors and weakness.  Endo/Heme/Allergies: Negative.   Psychiatric/Behavioral: Negative.     Past Medical History:  Diagnosis Date  . Anxiety   . Hyperlipemia   . Hypertension   . Palpitations   . Parkinson's disease ([place])     Family History  Problem Relation Age of Onset  . Heart Problems Mother   . Dementia Mother     Social History   Social History  . Marital status: Married    Spouse name: N/A  . Number of children: N/A  . Years of education: N/A   Occupational History  . Not on file.   Social History Main Topics  . Smoking status: Current Some Day Smoker  . Smokeless tobacco: Never Used  . Alcohol use Yes  . Drug use: No  . Sexual activity: Yes   Other Topics Concern  . Not on file   Social History Narrative  . No narrative on file    Past Surgical History:  Procedure Laterality Date  . TONSILLECTOMY       Prescriptions Prior to Admission  Medication Sig Dispense Refill Last Dose   . sertraline (ZOLOFT) 50 MG tablet Take 1 tablet (50 mg total) by mouth daily. 90 tablet 1   . traZODone (DESYREL) 100 MG tablet Take 1 tablet (100 mg total) by mouth at bedtime. 90 tablet 1   . carbidopa-levodopa (SINEMET IR) 25-100 MG per tablet Take 1 tablet by mouth 3 (three) times daily.     . hydrochlorothiazide (HYDRODIURIL) 25 MG tablet Take 25 mg by mouth daily.     Marland Kitchen lisinopril (PRINIVIL,ZESTRIL) 20 MG tablet Take 20 mg by mouth 2 (two) times daily.     . simvastatin (ZOCOR) 20 MG tablet Take 20 mg by mouth daily. Reported on 04/14/2015     . Vitamin D, Ergocalciferol, (DRISDOL) 50000 UNITS CAPS capsule Take 50,000 Units by mouth every 7 (seven) days.       Physical Exam: Blood pressure (!) 99/49, pulse 86, temperature 98.5 F (36.9 C), temperature source Oral, resp. rate 18, height 5\' 4"  (1.626 m), weight 153 lb 11.2 oz (69.7 kg), SpO2 95 %.   Wt Readings from Last 1 Encounters:  01/28/16 153 lb 11.2 oz (69.7 kg)     General appearance: cooperative Resp: clear to auscultation bilaterally Cardio: regular rate and rhythm GI: abnormal findings:  moderate tenderness in the lower abdomen Neurologic: Grossly normal  Labs:   Lab Results  Component Value Date   WBC 9.1 01/28/2016  HGB 6.2 (L) 01/28/2016   HCT 18.8 (L) 01/28/2016   MCV 91.2 01/28/2016   PLT 476 (H) 01/28/2016    Recent Labs Lab 01/28/16 1504  NA 135  K 2.2*  CL 94*  CO2 36*  BUN 8  CREATININE 0.47*  CALCIUM 7.6*  PROT 4.9*  BILITOT 0.7  ALKPHOS 143*  ALT 6*  AST 29  GLUCOSE 98   Lab Results  Component Value Date   TROPONINI <0.03 01/27/2016       EKG: sinus rhythm and sinus arrhythmia  ASSESSMENT AND PLAN:  Pt with parkinsons disease admitted after presenting with abdominal pain and being noted to have constipation. He was felt to have afib on twelve lead ekg. Appears to be sinus arrhytmia. He was severely hypokalemic at 2.1 and after hydration, was anemic at 6.2. Abdominal pain has  improved somewhat. Concerned of acute colitis. So far does not appear to have sustained afib. Will continue to repleat k to keep above 4.0. Continue workup of anemia and colitis. Will follow with you. Not a candidate for anticoagulation.  Signed: Teodoro Spray MD, [facility] 01/28/2016, 9:26 PM

## 2016-01-28 NOTE — Progress Notes (Signed)
Lab called critical result of potassium 2.2. MD notified.

## 2016-01-29 LAB — RETICULOCYTES
RBC.: 3.45 MIL/uL — ABNORMAL LOW (ref 4.40–5.90)
Retic Count, Absolute: 79.4 10*3/uL (ref 19.0–183.0)
Retic Ct Pct: 2.3 % (ref 0.4–3.1)

## 2016-01-29 LAB — CBC
HEMATOCRIT: 30.2 % — AB (ref 40.0–52.0)
HEMOGLOBIN: 10.3 g/dL — AB (ref 13.0–18.0)
MCH: 30 pg (ref 26.0–34.0)
MCHC: 34.3 g/dL (ref 32.0–36.0)
MCV: 87.6 fL (ref 80.0–100.0)
Platelets: 524 10*3/uL — ABNORMAL HIGH (ref 150–440)
RBC: 3.45 MIL/uL — ABNORMAL LOW (ref 4.40–5.90)
RDW: 21.8 % — ABNORMAL HIGH (ref 11.5–14.5)
WBC: 8.2 10*3/uL (ref 3.8–10.6)

## 2016-01-29 LAB — BASIC METABOLIC PANEL
ANION GAP: 6 (ref 5–15)
BUN: 6 mg/dL (ref 6–20)
CHLORIDE: 103 mmol/L (ref 101–111)
CO2: 30 mmol/L (ref 22–32)
Calcium: 7.9 mg/dL — ABNORMAL LOW (ref 8.9–10.3)
Creatinine, Ser: 0.47 mg/dL — ABNORMAL LOW (ref 0.61–1.24)
GFR calc Af Amer: >60 mL/min (ref >60)
GFR calc non Af Amer: >60 mL/min (ref >60)
GLUCOSE: 99 mg/dL (ref 65–99)
POTASSIUM: 3.8 mmol/L (ref 3.5–5.1)
Sodium: 139 mmol/L (ref 135–145)

## 2016-01-29 LAB — ECHOCARDIOGRAM COMPLETE
HEIGHTINCHES: 64 in
WEIGHTICAEL: 2459.2 [oz_av]

## 2016-01-29 LAB — HAPTOGLOBIN: HAPTOGLOBIN: 98 mg/dL (ref 34–200)

## 2016-01-29 LAB — MAGNESIUM: Magnesium: 2.7 mg/dL — ABNORMAL HIGH (ref 1.7–2.4)

## 2016-01-29 LAB — GLUCOSE, CAPILLARY
GLUCOSE-CAPILLARY: 98 mg/dL (ref 65–99)
GLUCOSE-CAPILLARY: 86 mg/dL (ref 65–99)
GLUCOSE-CAPILLARY: 91 mg/dL (ref 65–99)
GLUCOSE-CAPILLARY: 105 mg/dL — AB (ref 65–99)
Glucose-Capillary: 81 mg/dL (ref 65–99)

## 2016-01-29 LAB — SEDIMENTATION RATE: Sed Rate: 26 mm/hr — ABNORMAL HIGH (ref 0–20)

## 2016-01-29 LAB — HEMOGLOBIN A1C
HEMOGLOBIN A1C: 6.5 % — AB (ref 4.8–5.6)
MEAN PLASMA GLUCOSE: 140 mg/dL

## 2016-01-29 LAB — TSH: TSH: 1.334 u[IU]/mL (ref 0.350–4.500)

## 2016-01-29 LAB — VITAMIN B12: Vitamin B-12: 472 pg/mL (ref 180–914)

## 2016-01-29 LAB — DAT, POLYSPECIFIC AHG (ARMC ONLY): Polyspecific AHG test: NEGATIVE

## 2016-01-29 MED ORDER — LORAZEPAM 2 MG/ML IJ SOLN: 0.0000 mg | Freq: Four times a day (QID) | INTRAMUSCULAR | Status: AC

## 2016-01-29 MED ORDER — LACTULOSE 10 GM/15ML PO SOLN: 20.0000 g | Freq: Two times a day (BID) | ORAL | Status: DC | PRN

## 2016-01-29 MED ORDER — LORAZEPAM 2 MG/ML IJ SOLN
0.0000 mg | Freq: Two times a day (BID) | INTRAMUSCULAR | Status: DC
Start: 2016-01-30 — End: 2016-01-31

## 2016-01-29 NOTE — Care Management (Signed)
Receiving transfusions for hgb 6.2.  Oncology has consulted.  Highest ciwa score over the last 24 hours is 10.  Stool guaiac is negative x 1  At present, there is no gi consult ordered at present. Has been on a nutritional and functional decline over last few weeks.

## 2016-01-29 NOTE — Progress Notes (Signed)
Blood transfusion started at 00:16. No issues noted. Verified with second Rn,  Yasmin. Will continue to monitor.

## 2016-01-29 NOTE — Progress Notes (Signed)
[facility] [place] NAME: Charles Stanton    MR#:  [id number]  DATE OF BIRTH:  1955/04/11  SUBJECTIVE:  CHIEF COMPLAINT:   Chief Complaint  Patient presents with  . Abdominal Pain  . Constipation  Patient is a 61 year old Caucasian male with past medical history significant for history of hypertension, hyperlipidemia, Parkinson's disease, who presents to the hospital with complaints of abdominal pain, constipation, nausea. In emergency room, he was noted to be hypokalemic, had elevated white blood cell count, CT scan showed significant lower GI tract. Stool burden with some inflammation and bowel wall thickening distally, concerning for colitis. Patient was admitted to the hospital for further evaluation and treatment. He feels somewhat better today, after having numerous bowel movements. He denies any gastrointestinal bleeding. Hemoccult was negative. Hemoglobin level has dropped down from 8.6 on admission to 6.2 with rehydration. Current studies revealed elevated ferritin level and high saturation ratio, concerning for anemia of chronic disease. The patient feels comfortable today, although he was undergoing alcohol withdrawal symptomsus today with silver scale ranging between 4-10, now at 0. Denies significant abdominal pain at rest, however, admits of left-sided discomfort on palpation. Has intermittent diarrheal stool. Blood pressure has improved on high rate IV fluids.hemoglobin level has improved to 10.3 after 2 units of packed red blood cell transfusion. Hematologic studies revealed mildly elevated sedimentation rate, normal reticulocyte count, vitamin B12 level, pro time,haptoglobin. Oncologist saw the patient in consultation and recommended outpatient follow-up  Review of Systems  Constitutional: Negative for chills, fever and weight loss.  HENT: Negative for congestion.   Eyes: Negative for blurred vision and double vision.  Respiratory:  Negative for cough, sputum production, shortness of breath and wheezing.   Cardiovascular: Negative for chest pain, palpitations, orthopnea, leg swelling and PND.  Gastrointestinal: Positive for abdominal pain and constipation. Negative for blood in stool, diarrhea, nausea and vomiting.  Genitourinary: Negative for dysuria, frequency, hematuria and urgency.  Musculoskeletal: Negative for falls.  Neurological: Negative for dizziness, tremors, focal weakness and headaches.  Endo/Heme/Allergies: Does not bruise/bleed easily.  Psychiatric/Behavioral: Negative for depression. The patient does not have insomnia.     VITAL SIGNS: Blood pressure 110/68, pulse 84, temperature 98.6 F (37 C), temperature source Oral, resp. rate 18, height 5\' 4"  (1.626 m), weight 69.7 kg (153 lb 11.2 oz), SpO2 93 %.  PHYSICAL EXAMINATION:   GENERAL:  61 y.o.-year-old patient lying in the bed much more alert and comfortable, seems to be stronger and able to move in the bed a little bit easier EYES: Pupils equal, round, reactive to light and accommodation. No scleral icterus. Extraocular muscles intact.  HEENT: Head atraumatic, normocephalic. Oropharynx and nasopharynx clear.  NECK:  Supple, no jugular venous distention. No thyroid enlargement, no tenderness.  LUNGS: Normal breath sounds bilaterally, no wheezing, rales,rhonchi or crepitation. No use of accessory muscles of respiration.  CARDIOVASCULAR: S1, S2 normal. No murmurs, rubs, or gallops.  ABDOMEN: Soft, mild tenderness in Side of abdomen, no rebound or guarding was noted, nondistended. Bowel sounds present. No organomegaly or mass.  EXTREMITIES: No pedal edema, cyanosis, or clubbing.  NEUROLOGIC: Cranial nerves II through XII are intact. Muscle strength 5/5 in all extremities. Sensation intact. Gait not checked.  PSYCHIATRIC: The patient is somnolent, able to open his eyes and converse, oriented x 3. Masklike face, low volume voice SKIN: No obvious rash, lesion,  or ulcer.   ORDERS/RESULTS REVIEWED:   CBC  Recent Labs Lab 01/27/16 1751 01/28/16  0601 01/28/16 1504 01/29/16 0846  WBC 14.1* 10.0 9.1 8.2  HGB 8.6* 6.4* 6.2* 10.3*  HCT 26.2* 18.9* 18.8* 30.2*  PLT 796* 487* 476* 524*  MCV 90.4 89.5 91.2 87.6  MCH 29.6 30.5 29.8 30.0  MCHC 32.8 34.1 32.7 34.3  RDW 22.6* 22.8* 22.0* 21.8*   ------------------------------------------------------------------------------------------------------------------  Chemistries   Recent Labs Lab 01/27/16 1751 01/27/16 2200 01/28/16 0601 01/28/16 1504 01/29/16 0846  NA 131* 134* 134* 135 139  K 2.6* 5.2* 2.1* 2.2* 3.8  CL 83* 98* 90* 94* 103  CO2 33* 32 36* 36* 30  GLUCOSE 155* 91 98 98 99  BUN 10 9 9 8 6   CREATININE 0.83 0.62 0.71 0.47* 0.47*  CALCIUM 9.0 6.7* 7.8* 7.6* 7.9*  MG  --  1.6*  --   --  2.7*  AST 50*  --   --  29  --   ALT 6*  --   --  6*  --   ALKPHOS 196*  --   --  143*  --   BILITOT 1.7*  --   --  0.7  --    ------------------------------------------------------------------------------------------------------------------ estimated creatinine clearance is 83.3 mL/min (by C-G formula based on SCr of 0.47 mg/dL (L)). ------------------------------------------------------------------------------------------------------------------  Recent Labs  01/29/16 0846  TSH 1.334    Cardiac Enzymes  Recent Labs Lab 01/27/16 2200  TROPONINI <0.03   ------------------------------------------------------------------------------------------------------------------ Invalid input(s): POCBNP ---------------------------------------------------------------------------------------------------------------  RADIOLOGY: Dg Chest 2 View  Result Date: 01/27/2016 CLINICAL DATA:  61 year old male with cough.  History of AFib. EXAM: CHEST  2 VIEW COMPARISON:  Chest CT dated 11/27/2008 FINDINGS: There are bibasilar hazy densities which may represent atelectatic changes versus infiltrate. No  significant pleural effusion or pneumothorax. The cardiac borders are not well visualized and silhouetted by the adjacent soft tissues. There is osteopenia with degenerative changes of the spine. There is compression fracture of a mid to lower thoracic vertebra with anterior wedging which is age indeterminate. There is also compression fracture of an upper lumbar vertebra with anterior wedging. Clinical correlation is recommended. IMPRESSION: Bibasilar hazy densities may represent atelectasis versus infiltrate. Clinical correlation is recommended. Osteopenia with age indeterminate compression deformities of a mid to lower thoracic and a lumbar vertebra. Correlation with clinical exam and point tenderness recommended. Electronically Signed   By: Anner Crete M.D.   On: 01/27/2016 21:21   Ct Abdomen Pelvis W Contrast  Result Date: 01/27/2016 CLINICAL DATA:  Three day history of abdominal pain. EXAM: CT ABDOMEN AND PELVIS WITH CONTRAST TECHNIQUE: Multidetector CT imaging of the abdomen and pelvis was performed using the standard protocol following bolus administration of intravenous contrast. CONTRAST:  115mL ISOVUE-300 IOPAMIDOL (ISOVUE-300) INJECTION 61% COMPARISON:  11/27/2008 FINDINGS: Lower chest: Compressive atelectasis noted lower lungs, right greater than left. Heart is enlarged with coronary artery calcification. Hepatobiliary: Geographic fatty deposition noted within the liver parenchyma. There is no evidence for gallstones, gallbladder wall thickening, or pericholecystic fluid. No intrahepatic or extrahepatic biliary dilation. Pancreas: No focal mass lesion. No dilatation of the main duct. No intraparenchymal cyst. No peripancreatic edema. Spleen: No splenomegaly. No focal mass lesion. Adrenals/Urinary Tract: No adrenal nodule or mass. Kidneys are unremarkable. No evidence for hydroureter. The urinary bladder appears normal for the degree of distention. Stomach/Bowel: Small to moderate hiatal hernia  noted. Stomach otherwise unremarkable. Duodenum is normally positioned as is the ligament of Treitz. No small bowel wall thickening. No small bowel dilatation. The terminal ileum is normal. The appendix is normal. Prominent stool volume  distal colon with large stool volume noted in the rectum. Question circumferential wall thickening in the distal most aspect of the rectum, but not a definite finding. Vascular/Lymphatic: There is abdominal aortic atherosclerosis without aneurysm. There is no gastrohepatic or hepatoduodenal ligament lymphadenopathy. No intraperitoneal or retroperitoneal lymphadenopathy. No pelvic sidewall lymphadenopathy. Reproductive: The prostate gland and seminal vesicles have normal imaging features. Other: No intraperitoneal free fluid. Musculoskeletal: There is some nonspecific perirectal edema with extraperitoneal mild edema seen in the anterior left pelvic sidewall, indeterminate. Compression fracture at L2 appears acute and results and 50-75% loss of height anteriorly with less than 25% loss of height posteriorly. Compression deformity at T9 is age-indeterminate. Old bilateral rib fractures are evident. IMPRESSION: 1. Prominent stool volume in the distal colon and rectum. There is some collapse crescent there is a question of circumferential wall thickening in the distal most portion of the rectum with mild perirectal edema. 2. Acute L2 compression fracture with age-indeterminate fracture seen at T9. 3. Geographic fatty deposition in the liver parenchyma. Electronically Signed   By: Misty Stanley M.D.   On: 01/27/2016 21:07    EKG:  Orders placed or performed during the hospital encounter of 01/27/16  . EKG 12-Lead  . EKG 12-Lead    ASSESSMENT AND PLAN:  Principal Problem:   Colitis Active Problems:   Type 2 diabetes mellitus (HCC)   BP (high blood pressure)   Parkinson's disease (HCC)   Atrial fibrillation (HCC)   HLD (hyperlipidemia)   Anxiety   Hypokalemia  #1.  Symptomatic anemia with iron indices consistent with anemia of chronic disease , status post PRBC transfusion, no obvious hemolysis, appreciate oncologist input, Hemoccult was negative, no bleeding noted, hemoglobin level has improved, follow in the morning, likely to be discharged home with oncology follow-up as outpatient if hemoglobin remains stable.  #2. Severe constipation with ? stercoral colitis , patient as an appointment on Monday with gastroenterologist, he would benefit from  colonoscopy as outpatient, constipation has resolved.advance diet to soft.  Discharge home on Augmentin.  #3. Hypotension, due to anemia, hypoalbuminemia, infection, decrease IV fluids rate, although patient has some diarrheal stool at present, follow closely, advance diet to soft #4. Hypokalemia, supplementing intravenously and orally, supplementing magnesium level as well, normalized #5. Colitis, questionable stercoral colitis versus nonspecific inflammation versus cancerous condition, continue antibiotic therapy for now, improved clinically, would benefit from colonoscopy as outpatient #6. Thrombocytosis of unclear etiology, oncology consultation is appreciated, pro time INR, reticulocyte count, LDH, haptoglobin all normal,  unlikely hemolysis   Management plans discussed with the patient, family and they are in agreement.   DRUG ALLERGIES:  Allergies  Allergen Reactions  . Amantadine Other (See Comments)    fatigue sleepy    CODE STATUS:     Code Status Orders        Start     Ordered   01/28/16 0123  Full code  Continuous     01/28/16 0122    Code Status History    Date Active Date Inactive Code Status Order ID Comments User Context   This patient has a current code status but no historical code status.      TOTAL TIME TAKING CARE OF THIS PATIENT: 35 minutes.    Theodoro Grist M.D on 01/29/2016 at 6:15 PM  Between 7am to 6pm - Pager - [phone]  After 6pm go to [email or web address] - password  [facility]  [facility]  Office  [phone]  CC: Primary care physician; No PCP Per  Patient

## 2016-01-29 NOTE — Progress Notes (Signed)
Second bag for blood transfusion started at 0355. No issues noted. Verified with second RN , Crystal. Will continue to monitor.

## 2016-01-29 NOTE — Care Management (Signed)
Patient and his wife are agreeable to home health services.  [facility] s in network with patient's insurance.  Heads up referral made to [facility] for SN PT OT.  No equipment needs

## 2016-01-29 NOTE — Evaluation (Signed)
Occupational Therapy Evaluation Patient Details Name: Charles Stanton MRN: [id number] DOB: 25-Aug-1955 Today's Date: 01/29/2016    History of Present Illness (P) Partick Fiola  is a 61 y.o. male who presents with Abdominal pain, constipation, nausea. Evaluation in the ED showed hypokalemia, elevated white count, and on CT scan significant lower GI tract stool burden with some inflammation and bowel wall thickening distally. Patient with elevated white count and was given antibiotics in the ED, and hospitalists were called for admission. Pt with hx of Parkinson's disease.     Clinical Impression   Patient is  61 y.o. male who was admitted for above symptoms. Pt. presents with flat affect with delay in responses at times, decreased strength, decreased functional hand use, decreased balance and fair safety awareness and decreased functional mobility which hinder his ability to complete ADL and IADL tasks. Patient could benefit from skilled OT services to work on RUE neuro muscular re-ed, therapeutic exercises, balance training during ADLs and pt./family education.  Patient would be a good candidate to continue OT services at [facility] help implement meaningful tasks for pt to help out and increase functional endurance.    Follow Up Recommendations  Home health OT    Equipment Recommendations  3 in 1 bedside commode;Tub/shower bench    Recommendations for Other Services       Precautions / Restrictions Precautions Precautions: (P) Fall Restrictions Weight Bearing Restrictions: (P) No      Mobility Bed Mobility                  Transfers                      Balance                                            ADL Overall ADL's : Needs assistance/impaired Eating/Feeding: Minimal assistance;Set up   Grooming: Wash/dry hands;Wash/dry face;Oral care;Minimal assistance;Set up Grooming Details (indicate cue type and reason): pt given foam utensil  holder to build up the handle         Upper Body Dressing : Minimal assistance;Set up   Lower Body Dressing: Minimal assistance;Set up;+2 for safety/equipment Lower Body Dressing Details (indicate cue type and reason): cues for safety when leaning forward                     Vision     Perception     Praxis      Pertinent Vitals/Pain Pain Assessment: No/denies pain     Hand Dominance Right   Extremity/Trunk Assessment Upper Extremity Assessment Upper Extremity Assessment: RUE deficits/detail RUE Deficits / Details: Pt has decreased strength in RUE and hand prior to admission but reports it is even weaker now due to anemia.  He is able to slowly make a fist for active flexion and extension and flex shouilder to about 80 degrees sitting EOB.  Decreased supination noted.  Intact sensation.  RUE Coordination: decreased fine motor;decreased gross motor   Lower Extremity Assessment Lower Extremity Assessment: Defer to PT evaluation       Communication Communication Communication: No difficulties   Cognition Arousal/Alertness: Awake/alert Behavior During Therapy: WFL for tasks assessed/performed Overall Cognitive Status: History of cognitive impairments - at baseline  General Comments       Exercises       Shoulder Instructions      Home Living Family/patient expects to be discharged to:: (P) Private residence Living Arrangements: Spouse/significant other   Type of Home: House Home Access: Stairs to enter Technical brewer of Steps: 8 Entrance Stairs-Rails: Left Home Layout: Two level Alternate Level Stairs-Number of Steps: ~10 Alternate Level Stairs-Rails: Right;Left Bathroom Shower/Tub: Tub/shower unit Shower/tub characteristics: Architectural technologist: Standard Bathroom Accessibility: Yes How Accessible: Accessible via walker Home Equipment: [facility] - single point   Additional Comments: pt was sleeping on couch  when he started to get weak and occasionally used SPC.        Prior Functioning/Environment Level of Independence: Independent with assistive device(s)                 OT Problem List: Decreased strength;Decreased range of motion;Decreased activity tolerance;Decreased coordination;Impaired UE functional use;Decreased safety awareness   OT Treatment/Interventions: Self-care/ADL training;Patient/family education;Therapeutic activities;Neuromuscular education;Therapeutic exercise;Balance training    OT Goals(Current goals can be found in the care plan section) Acute Rehab OT Goals Patient Stated Goal: "to go home" OT Goal Formulation: With patient/family Time For Goal Achievement: 02/12/16 Potential to Achieve Goals: Good ADL Goals Pt Will Perform Lower Body Dressing: with set-up;with min assist;sit to/from stand (with no LOB sitting EOB or in chair using both hands) Pt Will Transfer to Toilet: with min assist;stand pivot transfer;bedside commode (BSC or toilet) Pt Will Perform Toileting - Clothing Manipulation and hygiene: with min guard assist Pt/caregiver will Perform Home Exercise Program: Right Upper extremity;With theraputty;With written HEP provided  OT Frequency: Min 1X/week   Barriers to D/C:            Co-evaluation              End of Session    Activity Tolerance: Patient tolerated treatment well Patient left: in bed;Other (comment) (PT present for eval and pt at EOB)   Time: [id number] OT Time Calculation (min): 40 min Charges:  OT General Charges $OT Visit: 1 Procedure OT Evaluation $OT Eval Moderate Complexity: 1 Procedure OT Treatments $Self Care/Home Management : 8-22 mins $Neuromuscular Re-education: 8-22 mins G-Codes:    Chrys Racer, OTR/L ascom [phone] 01/29/16, 4:24 PM

## 2016-01-29 NOTE — Evaluation (Signed)
Physical Therapy Evaluation Patient Details Name: Charles Stanton MRN: [id number] DOB: 1955/02/07 Today's Date: 01/29/2016   History of Present Illness  Charles Stanton  is a 61 y.o. male who presents with Abdominal pain, constipation, nausea. Evaluation in the ED showed hypokalemia, elevated white count, and on CT scan significant lower GI tract stool burden with some inflammation and bowel wall thickening distally. Patient with elevated white count and was given antibiotics in the ED, and hospitalists were called for admission. Pt with hx of Parkinson's disease.    Clinical Impression  Pt shows good effort t/o PT exam and is able to circumambulate the nurses station (100 ft w/o AD) and generally was safe and confident.  He did not have excessive fatigue and his vitals remained appropriate.  Overall he will be safe to return home but is weaker than normal and not at his baseline.  Pt eager to go home tomorrow if possible.      Follow Up Recommendations      Equipment Recommendations       Recommendations for Other Services       Precautions / Restrictions Precautions Precautions: Fall Restrictions Weight Bearing Restrictions: No      Mobility  Bed Mobility Overal bed mobility: Independent             General bed mobility comments: Pt sitting up in bed on arrival, able to get himself back to supine w/o assist or hesitation  Transfers Overall transfer level: Independent               General transfer comment: Pt able to rise to standing with light UE use and able to maintain standing balance w/ only very light use of AD  Ambulation/Gait Ambulation/Gait assistance: Supervision Ambulation Distance (Feet): 200 Feet Assistive device: Rolling walker (2 wheeled);None     Gait velocity interpretation: <1.8 ft/sec, indicative of risk for recurrent falls General Gait Details: Pt walked ~100 ft with walker and single rail use, then did the last 100 ft with no AD and had no  safety issues.  He reported being slower than normal and having more fatigue than he otherwise would have expected but generally did well and showed good safety.  Pt with baseline R knee bend during WBing and showed slight limp, but again not too far from his baseline.  Stairs            Wheelchair Mobility    Modified Rankin (Stroke Patients Only)       Balance Overall balance assessment: Modified Independent                                           Pertinent Vitals/Pain Pain Assessment: No/denies pain    Home Living Family/patient expects to be discharged to:: Private residence Living Arrangements: Spouse/significant other   Type of Home: House Home Access: Stairs to enter Entrance Stairs-Rails: Left Entrance Stairs-Number of Steps: 8 Home Layout: Two level Home Equipment: Cane - single point Additional Comments: pt was sleeping on couch when he started to get weak and occasionally used SPC.      Prior Function Level of Independence: Independent with assistive device(s)         Comments: Pt still does drive occasionally, uses cane when feeling weak     Hand Dominance   Dominant Hand: Right    Extremity/Trunk Assessment   Upper Extremity Assessment  Upper Extremity Assessment: Defer to OT evaluation RUE Deficits / Details: Pt has decreased strength in RUE and hand prior to admission but reports it is even weaker now due to anemia.  He is able to slowly make a fist for active flexion and extension and flex shouilder to about 80 degrees sitting EOB.  Decreased supination noted.  Intact sensation.  RUE Coordination: decreased fine motor;decreased gross motor    Lower Extremity Assessment Lower Extremity Assessment: Overall WFL for tasks assessed (functional strength b/l, R LE grossly 4-/5, L grossly 4/5 )       Communication   Communication: No difficulties  Cognition Arousal/Alertness: Awake/alert Behavior During Therapy: WFL for tasks  assessed/performed Overall Cognitive Status: Within Functional Limits for tasks assessed                      General Comments      Exercises     Assessment/Plan    PT Assessment Patient needs continued PT services  PT Problem List Decreased strength;Decreased activity tolerance;Decreased knowledge of use of DME;Decreased safety awareness;Decreased mobility;Decreased balance          PT Treatment Interventions Gait training;Stair training    PT Goals (Current goals can be found in the Care Plan section)  Acute Rehab PT Goals Patient Stated Goal: "to go home" PT Goal Formulation: With patient/family Time For Goal Achievement: 02/12/16 Potential to Achieve Goals: Good    Frequency Min 2X/week   Barriers to discharge        Co-evaluation               End of Session Equipment Utilized During Treatment: Gait belt Activity Tolerance: Patient tolerated treatment well Patient left: with bed alarm set;with call bell/phone within reach;with family/visitor present           Time: [id number] PT Time Calculation (min) (ACUTE ONLY): 23 min   Charges:   PT Evaluation $PT Eval Low Complexity: 1 Procedure     PT G CodesKreg Shropshire, DPT 01/29/2016, 4:32 PM

## 2016-01-30 ENCOUNTER — Inpatient Hospital Stay: Payer: Federal, State, Local not specified - PPO

## 2016-01-30 LAB — GLUCOSE, CAPILLARY
GLUCOSE-CAPILLARY: 91 mg/dL (ref 65–99)
Glucose-Capillary: 90 mg/dL (ref 65–99)
Glucose-Capillary: 92 mg/dL (ref 65–99)
Glucose-Capillary: 95 mg/dL (ref 65–99)

## 2016-01-30 LAB — CBC WITH DIFFERENTIAL/PLATELET
BASOS ABS: 0 10*3/uL (ref 0–0.1)
Basophils Relative: 1 %
EOS PCT: 4 %
Eosinophils Absolute: 0.3 10*3/uL (ref 0–0.7)
HCT: 30.2 % — ABNORMAL LOW (ref 40.0–52.0)
Hemoglobin: 10.2 g/dL — ABNORMAL LOW (ref 13.0–18.0)
LYMPHS PCT: 8 %
Lymphs Abs: 0.6 10*3/uL — ABNORMAL LOW (ref 1.0–3.6)
MCH: 29.7 pg (ref 26.0–34.0)
MCHC: 33.6 g/dL (ref 32.0–36.0)
MCV: 88.3 fL (ref 80.0–100.0)
MONO ABS: 0.5 10*3/uL (ref 0.2–1.0)
Monocytes Relative: 6 %
Neutro Abs: 6.9 10*3/uL — ABNORMAL HIGH (ref 1.4–6.5)
Neutrophils Relative %: 81 %
PLATELETS: 471 10*3/uL — AB (ref 150–440)
RBC: 3.42 MIL/uL — ABNORMAL LOW (ref 4.40–5.90)
RDW: 22.2 % — AB (ref 11.5–14.5)
WBC: 8.4 10*3/uL (ref 3.8–10.6)

## 2016-01-30 LAB — TYPE AND SCREEN
BLOOD PRODUCT EXPIRATION DATE: 201802272359
Blood Product Expiration Date: 201802202359
ISSUE DATE / TIME: 201801260006
ISSUE DATE / TIME: 201801260334
Unit Type and Rh: 7300
Unit Type and Rh: 7300

## 2016-01-30 MED ORDER — AMOXICILLIN-POT CLAVULANATE 875-125 MG PO TABS
1.0000 | ORAL_TABLET | Freq: Two times a day (BID) | ORAL | Status: DC
  Administered 2016-01-30 – 2016-01-31 (×3): 1 via ORAL
  Filled 2016-01-30 (×3): qty 1

## 2016-01-30 MED ORDER — LOPERAMIDE HCL 2 MG PO CAPS: 2.0000 mg | ORAL_CAPSULE | Freq: Four times a day (QID) | ORAL | Status: DC | PRN

## 2016-01-30 NOTE — Consult Note (Signed)
Consultation  Referring Provider:     No ref. provider found Primary Care Physician:  No PCP Per Patient Primary Gastroenterologist: Unknown   Reason for Consultation:  Anemia  Date of Admission:  01/27/2016 Date of Consultation:  01/30/2016         HPI:   Charles Stanton is a 61 y.o. 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  Upon presentation, he was found to have a new diagnosis of paroxysmal A.fib and colitis on a CT scan. The patient reports that his abdominal pain resolved after he started having BMs since being hospitalized. Now he is having diarrhea.  Upon admission, his hemoglobin was found to be 8.6g, which dropped to 6.2g after hydration. He received 2u pRBCs.  The patient and his wife report that he retired from the postal service about 2 years ago after a diagnosis of Parkinson's disease. However, more recently, he has had a significant decline over the past few months. He has been feeling much weaker and more fatigued. This did not improve after his blood transfusion this hospitalization. He has also had a decreased appetite and his wife notes a 7lb weight loss over the past year in this setting. He has new constipation and reports abdominal pain anytime he does not stool.   Of note, he drinks 2-3 whiskey based drinks daily for the past 10-11 years. His mother died of colon cancer, which was diagnosed when she was 60 years old. He recalls having a colonoscopy 7-8 years ago, which he said, they found some "stuff, but nothing dangerous" but he did not present for his surveillance colonoscopy. He also reports having an EGD 3 years ago where they found an ulcer, which he was told was because of the high doses of aspirin he was taking for pain from a dog bite. More recently, he started taking OTC motrin about 400-600mg  daily for the past week without gastric  protection.    Past Medical History:  Diagnosis Date  . Anxiety   . Hyperlipemia   . Hypertension   . Palpitations   . Parkinson's disease [facility])     Past Surgical History:  Procedure Laterality Date  . TONSILLECTOMY      Prior to Admission medications   Medication Sig Start Date End Date Taking? Authorizing Provider  sertraline (ZOLOFT) 50 MG tablet Take 1 tablet (50 mg total) by mouth daily. 10/05/15  Yes Rainey Pines, MD  traZODone (DESYREL) 100 MG tablet Take 1 tablet (100 mg total) by mouth at bedtime. 10/05/15  Yes Rainey Pines, MD  carbidopa-levodopa (SINEMET IR) 25-100 MG per tablet Take 1 tablet by mouth 3 (three) times daily.    Historical Provider, MD  hydrochlorothiazide (HYDRODIURIL) 25 MG tablet Take 25 mg by mouth daily.    Historical Provider, MD  lisinopril (PRINIVIL,ZESTRIL) 20 MG tablet Take 20 mg by mouth 2 (two) times daily.    Historical Provider, MD  simvastatin (ZOCOR) 20 MG tablet Take 20 mg by mouth daily. Reported on 04/14/2015    Historical Provider, MD  Vitamin D, Ergocalciferol, (DRISDOL) 50000 UNITS CAPS capsule Take 50,000 Units by mouth every 7 (seven) days.    Historical Provider, MD    Family History  Problem Relation Age of Onset  . Heart Problems Mother   . Dementia Mother      Social History  Substance Use Topics  .  Smoking status: Current Some Day Smoker  . Smokeless tobacco: Never Used  . Alcohol use Yes    Allergies as of 01/27/2016 - Review Complete 01/27/2016  Allergen Reaction Noted  . Amantadine Other (See Comments) 05/06/2015    Review of Systems:    All systems reviewed and negative except where noted in HPI.   Physical Exam:  Vital signs in last 24 hours: Temp:  [98.2 F (36.8 C)-99.1 F (37.3 C)] 99.1 F (37.3 C) (01/27 1123) Pulse Rate:  [75-82] 82 (01/27 1123) Resp:  [18-22] 22 (01/27 1123) BP: (128-143)/(76-92) 143/92 (01/27 1123) SpO2:  [95 %-99 %] 98 % (01/27 1123) Last BM Date: 01/30/16 General: Thin white  male appearing older than his staged age slumped hugging a pillow in a hospital bed Head: bitemporal wasting Eyes: No icterus. Ears: Normal auditory acuity. Neck: bearded, no lymphadenopathy appreciated Lungs: Breathing comfortably on room air, coughing with deep inspiration, significant expiratory wheezing Heart: Regular rate and rhythm, normal S1, S2 Abdomen: +bowel sounds, soft, nondistended, nontender. No appreciable organomegaly. Msk:  Extremely weak, requiring assistance to sit up on the side of the bed Extremities: without edema, cyanosis or clubbing. Neurologic:  Alert, conversing appropriately Skin: numerous erythematous patches on his back Psych: irritable affect  LAB RESULTS:  Recent Labs  01/28/16 1504 01/29/16 0846 01/30/16 0907  WBC 9.1 8.2 8.4  HGB 6.2* 10.3* 10.2*  HCT 18.8* 30.2* 30.2*  PLT 476* 524* 471*   BMET  Recent Labs  01/28/16 0601 01/28/16 1504 01/29/16 0846  NA 134* 135 139  K 2.1* 2.2* 3.8  CL 90* 94* 103  CO2 36* 36* 30  GLUCOSE 98 98 99  BUN 9 8 6   CREATININE 0.71 0.47* 0.47*  CALCIUM 7.8* 7.6* 7.9*   LFT  Recent Labs  01/28/16 1504  PROT 4.9*  ALBUMIN 2.5*  AST 29  ALT 6*  ALKPHOS 143*  BILITOT 0.7   PT/INR  Recent Labs  01/28/16 1633  LABPROT 15.2  INR 1.19    STUDIES: Dg Abd 2 Views  Result Date: 01/30/2016 CLINICAL DATA:  Abdominal pain, constipation and colitis. Subsequent encounter. EXAM: ABDOMEN - 2 VIEW COMPARISON:  CT, 01/27/2016 FINDINGS: Normal bowel gas pattern with no evidence of bowel obstruction or significant generalized adynamic ileus. No free air. No convincing renal or ureteral stones. Opacity at the lung bases has increased from the prior study, likely combination of small effusions and atelectasis. IMPRESSION: 1. No acute findings in the abdomen. No evidence of bowel obstruction or free air. 2. Increased opacity at the lung bases most likely combination of small effusions and atelectasis.  Electronically Signed   By: Lajean Manes M.D.   On: 01/30/2016 10:13      Impression / Plan:   Charles Stanton is a 61 y.o. 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 The patient's abdominal pain may certainly be due to his constipation as he reports significant improvement in his symptoms with stooling. However, given his extensive colonic stool burden, it would be wise to clean out his colon and allow him to continue stooling, it is possible that his diarrhea is explained by overflow incontinence. The colitis seen on his scan could be due to significant constipation, it could also be ischemic given his new diagnosis of AF, however, the colitis is not clinically significant at this time and will  be evaluated on the colonoscopy that he will require anyway.  His anemia is concerning, however, his blood cell indices do not point toward GI losses of blood. Therefore, he will certainly need a hematologic evaluation. As malignancy is high on the differential for this patient, it is important that he undergo age appropriate malignancy screening and surveillance, including a colonoscopy, which is especially important given his mother's history of colon cancer and his new constipation. Given his history of upper GI bleeding, chronic alcoholism and recent NSAID use, it is advisable to undergo an EGD at the same time. The patient and his wife report having an appointment with a gastroenterologist at the [facility] clinic on Monday and therefore it would be in their best interest to keep that appointment. If they are unable to be discharged before then, we can plan for an EGD and colonoscopy on Monday.   - aggressive miralax regimen: miralax 34g tid until stools are clear - outpatient GI follow up with plan for outpatient EGD and colonoscopy - outpatient hematology evaluation should his  endoscopic evaluation be unremarkable  Thank you for involving me in the care of this patient.      LOS: 3 days   Lisbeth Renshaw, MD  01/30/2016, 12:34 PM

## 2016-01-30 NOTE — Progress Notes (Signed)
Dr. Lizbeth Bark rounding (see note in to follow). Recommends 'cleaning patient out' with miralax and not giving any imodium as patient's symptoms will come back. Updated Dr. Posey Pronto.

## 2016-01-30 NOTE — Progress Notes (Signed)
[facility] NAME: Charles Stanton    MR#:  [id number]  DATE OF BIRTH:  [birth year]  SUBJECTIVE:  CHIEF COMPLAINT:   Chief Complaint  Patient presents with  . Abdominal Pain  . Constipation     Patient that he's been having lots of loose stools.     Review of Systems  Constitutional: Negative for chills, fever and weight loss.  HENT: Negative for congestion.   Eyes: Negative for blurred vision and double vision.  Respiratory: Negative for cough, sputum production, shortness of breath and wheezing.   Cardiovascular: Negative for chest pain, palpitations, orthopnea, leg swelling and PND.  Gastrointestinal: Positive for diarrhea. Negative for abdominal pain, blood in stool, constipation, nausea and vomiting.  Genitourinary: Negative for dysuria, frequency, hematuria and urgency.  Musculoskeletal: Negative for falls.  Neurological: Negative for dizziness, tremors, focal weakness and headaches.  Endo/Heme/Allergies: Does not bruise/bleed easily.  Psychiatric/Behavioral: Negative for depression. The patient does not have insomnia.     VITAL SIGNS: Blood pressure (!) 143/92, pulse 82, temperature 99.1 F (37.3 C), temperature source Oral, resp. rate (!) 22, height 5\' 4"  (1.626 m), weight 153 lb 11.2 oz (69.7 kg), SpO2 98 %.  PHYSICAL EXAMINATION:   GENERAL:  61 y.o.-year-old patient lying in the bed much more alert and comfortable, seems to be stronger and able to move in the bed a little bit easier EYES: Pupils equal, round, reactive to light and accommodation. No scleral icterus. Extraocular muscles intact.  HEENT: Head atraumatic, normocephalic. Oropharynx and nasopharynx clear.  NECK:  Supple, no jugular venous distention. No thyroid enlargement, no tenderness.  LUNGS: Normal breath sounds bilaterally, no wheezing, rales,rhonchi or crepitation. No use of accessory muscles of respiration.  CARDIOVASCULAR: S1, S2 normal. No  murmurs, rubs, or gallops.  ABDOMEN: Soft, mild tenderness in Side of abdomen, no rebound or guarding was noted, nondistended. Bowel sounds present. No organomegaly or mass.  EXTREMITIES: No pedal edema, cyanosis, or clubbing.  NEUROLOGIC: Cranial nerves II through XII are intact. Muscle strength 5/5 in all extremities. Sensation intact. Gait not checked.  PSYCHIATRIC: The patient is somnolent, able to open his eyes and converse, oriented x 3. Masklike face, low volume voice SKIN: No obvious rash, lesion, or ulcer.   ORDERS/RESULTS REVIEWED:   CBC  Recent Labs Lab 01/27/16 1751 01/28/16 0601 01/28/16 1504 01/29/16 0846 01/30/16 0907  WBC 14.1* 10.0 9.1 8.2 8.4  HGB 8.6* 6.4* 6.2* 10.3* 10.2*  HCT 26.2* 18.9* 18.8* 30.2* 30.2*  PLT 796* 487* 476* 524* 471*  MCV 90.4 89.5 91.2 87.6 88.3  MCH 29.6 30.5 29.8 30.0 29.7  MCHC 32.8 34.1 32.7 34.3 33.6  RDW 22.6* 22.8* 22.0* 21.8* 22.2*  LYMPHSABS  --   --   --   --  0.6*  MONOABS  --   --   --   --  0.5  EOSABS  --   --   --   --  0.3  BASOSABS  --   --   --   --  0.0   ------------------------------------------------------------------------------------------------------------------  Chemistries   Recent Labs Lab 01/27/16 1751 01/27/16 2200 01/28/16 0601 01/28/16 1504 01/29/16 0846  NA 131* 134* 134* 135 139  K 2.6* 5.2* 2.1* 2.2* 3.8  CL 83* 98* 90* 94* 103  CO2 33* 32 36* 36* 30  GLUCOSE 155* 91 98 98 99  BUN 10 9 9 8 6   CREATININE 0.83 0.62 0.71 0.47* 0.47*  CALCIUM 9.0  6.7* 7.8* 7.6* 7.9*  MG  --  1.6*  --   --  2.7*  AST 50*  --   --  29  --   ALT 6*  --   --  6*  --   ALKPHOS 196*  --   --  143*  --   BILITOT 1.7*  --   --  0.7  --    ------------------------------------------------------------------------------------------------------------------ estimated creatinine clearance is 83.3 mL/min (by C-G formula based on SCr of 0.47 mg/dL  (L)). ------------------------------------------------------------------------------------------------------------------  Recent Labs  01/29/16 0846  TSH 1.334    Cardiac Enzymes  Recent Labs Lab 01/27/16 2200  TROPONINI <0.03   ------------------------------------------------------------------------------------------------------------------ Invalid input(s): POCBNP ---------------------------------------------------------------------------------------------------------------  RADIOLOGY: Dg Abd 2 Views  Result Date: 01/30/2016 CLINICAL DATA:  Abdominal pain, constipation and colitis. Subsequent encounter. EXAM: ABDOMEN - 2 VIEW COMPARISON:  CT, 01/27/2016 FINDINGS: Normal bowel gas pattern with no evidence of bowel obstruction or significant generalized adynamic ileus. No free air. No convincing renal or ureteral stones. Opacity at the lung bases has increased from the prior study, likely combination of small effusions and atelectasis. IMPRESSION: 1. No acute findings in the abdomen. No evidence of bowel obstruction or free air. 2. Increased opacity at the lung bases most likely combination of small effusions and atelectasis. Electronically Signed   By: Lajean Manes M.D.   On: 01/30/2016 10:13    EKG:  Orders placed or performed during the hospital encounter of 01/27/16  . EKG 12-Lead  . EKG 12-Lead    ASSESSMENT AND PLAN:   #1. Symptomatic anemia with iron indices consistent with anemia of chronic disease , status post PRBC transfusion,Hemoglobin stable #2. Severe constipation with associated colitis, patients have had significant bowel movements not liquidy bowel movements. We will stop when necessary lactulose #3. Hypotension, due to anemia, hypoalbuminemia, now improved #4. Hypokalemia, replaced  #5. Colitis, suspected related constipation continue antibiotics for now #6. Thrombocytosis of unclear etiology, oncology consultation is appreciated, pro time INR, reticulocyte  count, LDH, haptoglobin all normal,  unlikely hemolysis   Management plans discussed with the patient, family and they are in agreement.   DRUG ALLERGIES:  Allergies  Allergen Reactions  . Amantadine Other (See Comments)    fatigue sleepy    CODE STATUS:     Code Status Orders        Start     Ordered   01/28/16 0123  Full code  Continuous     01/28/16 0122    Code Status History    Date Active Date Inactive Code Status Order ID Comments User Context   This patient has a current code status but no historical code status.      TOTAL TIME TAKING CARE OF THIS PATIENT: 32 minutes.    Dustin Flock M.D on 01/30/2016 at 1:28 PM  Between 7am to 6pm - Pager - [phone]  After 6pm go to [email or web address] - password [email or web address] [facility]  [facility]  Office  [phone]  CC: Primary care physician; No PCP Per Patient

## 2016-01-30 NOTE — Progress Notes (Signed)
[facility]  SUBJECTIVE: No cardiac complaints at present.   Vitals:   01/29/16 0700 01/29/16 1109 01/29/16 2000 01/30/16 0556  BP: 120/81 110/68 128/76 134/76  Pulse: 73 84 75 77  Resp: 18 18 18 18   Temp: 97.3 F (36.3 C) 98.6 F (37 C) 98.3 F (36.8 C) 98.2 F (36.8 C)  TempSrc: Oral Oral Oral Oral  SpO2: 97% 93% 95% 99%  Weight:      Height:        Intake/Output Summary (Last 24 hours) at 01/30/16 0924 Last data filed at 01/29/16 2301  Gross per 24 hour  Intake          1818.92 ml  Output                0 ml  Net          1818.92 ml    LABS: Basic Metabolic Panel:  Recent Labs  01/27/16 2200  01/28/16 1504 01/29/16 0846  NA 134*  < > 135 139  K 5.2*  < > 2.2* 3.8  CL 98*  < > 94* 103  CO2 32  < > 36* 30  GLUCOSE 91  < > 98 99  BUN 9  < > 8 6  CREATININE 0.62  < > 0.47* 0.47*  CALCIUM 6.7*  < > 7.6* 7.9*  MG 1.6*  --   --  2.7*  < > = values in this interval not displayed. Liver Function Tests:  Recent Labs  01/27/16 1751 01/28/16 1504  AST 50* 29  ALT 6* 6*  ALKPHOS 196* 143*  BILITOT 1.7* 0.7  PROT 7.2 4.9*  ALBUMIN 3.4* 2.5*    Recent Labs  01/27/16 1751  LIPASE 30   CBC:  Recent Labs  01/28/16 1504 01/29/16 0846  WBC 9.1 8.2  HGB 6.2* 10.3*  HCT 18.8* 30.2*  MCV 91.2 87.6  PLT 476* 524*   Cardiac Enzymes:  Recent Labs  01/27/16 2200  TROPONINI <0.03   BNP: Invalid input(s): POCBNP D-Dimer: No results for input(s): DDIMER in the last 72 hours. Hemoglobin A1C:  Recent Labs  01/28/16 0601  HGBA1C 6.5*   Fasting Lipid Panel: No results for input(s): CHOL, HDL, LDLCALC, TRIG, CHOLHDL, LDLDIRECT in the last 72 hours. Thyroid Function Tests:  Recent Labs  01/29/16 0846  TSH 1.334   Anemia Panel:  Recent Labs  01/28/16 0601  01/29/16 0846  VITAMINB12  --   --  472  FERRITIN 728*  --   --   TIBC 193*  --   --   IRON 160  --   --   RETICCTPCT  --   < > 2.3  < > = values  in this interval not displayed.   Physical Exam: Blood pressure 134/76, pulse 77, temperature 98.2 F (36.8 C), temperature source Oral, resp. rate 18, height 5\' 4"  (1.626 m), weight 153 lb 11.2 oz (69.7 kg), SpO2 99 %.   Wt Readings from Last 1 Encounters:  01/28/16 153 lb 11.2 oz (69.7 kg)     General appearance: alert and cooperative Resp: clear to auscultation bilaterally Cardio: regular rate and rhythm GI: soft, non-tender; bowel sounds normal; no masses,  no organomegaly Neurologic: Grossly normal  TELEMETRY: Reviewed telemetry pt in normal sinus rhythm  ASSESSMENT AND PLAN:  Principal Problem:   Colitis-improving Active Problems:   Type 2 diabetes mellitus (HCC)   BP (high blood pressure)   Parkinson's disease (HCC)   Atrial  fibrillation (HCC)-no obvious atrial fibrillation noted currently. Would not anticoagulate. Would continue with current regimen and follow.   HLD (hyperlipidemia)   Anxiety   Hypokalemia-keep potassium greater than 4.0.    Teodoro Spray, MD, [facility] 01/30/2016 9:24 AM

## 2016-01-31 LAB — BASIC METABOLIC PANEL
ANION GAP: 5 (ref 5–15)
BUN: 6 mg/dL (ref 6–20)
CALCIUM: 8.2 mg/dL — AB (ref 8.9–10.3)
CO2: 28 mmol/L (ref 22–32)
Chloride: 106 mmol/L (ref 101–111)
Creatinine, Ser: 0.42 mg/dL — ABNORMAL LOW (ref 0.61–1.24)
Glucose, Bld: 80 mg/dL (ref 65–99)
Potassium: 3.9 mmol/L (ref 3.5–5.1)
Sodium: 139 mmol/L (ref 135–145)

## 2016-01-31 LAB — CBC
HCT: 29.9 % — ABNORMAL LOW (ref 40.0–52.0)
Hemoglobin: 10.1 g/dL — ABNORMAL LOW (ref 13.0–18.0)
MCH: 30.2 pg (ref 26.0–34.0)
MCHC: 33.9 g/dL (ref 32.0–36.0)
MCV: 88.9 fL (ref 80.0–100.0)
PLATELETS: 461 10*3/uL — AB (ref 150–440)
RBC: 3.36 MIL/uL — ABNORMAL LOW (ref 4.40–5.90)
RDW: 22.1 % — AB (ref 11.5–14.5)
WBC: 11 10*3/uL — AB (ref 3.8–10.6)

## 2016-01-31 LAB — GLUCOSE, CAPILLARY
GLUCOSE-CAPILLARY: 100 mg/dL — AB (ref 65–99)
Glucose-Capillary: 87 mg/dL (ref 65–99)

## 2016-01-31 MED ORDER — AMOXICILLIN-POT CLAVULANATE 875-125 MG PO TABS: 1.0000 | ORAL_TABLET | Freq: Two times a day (BID) | ORAL | 0 refills | Status: DC

## 2016-01-31 MED ORDER — FUROSEMIDE 10 MG/ML IJ SOLN
40.0000 mg | Freq: Once | INTRAMUSCULAR | Status: AC
  Administered 2016-01-31: 40 mg via INTRAVENOUS
  Filled 2016-01-31: qty 4

## 2016-01-31 MED ORDER — IPRATROPIUM-ALBUTEROL 0.5-2.5 (3) MG/3ML IN SOLN
3.0000 mL | Freq: Once | RESPIRATORY_TRACT | Status: DC
  Filled 2016-01-31: qty 3

## 2016-01-31 NOTE — Discharge Summary (Signed)
[place] at [facility], 61 y.o., DOB 1955/07/13, MRN [id number]. Admission date: 01/27/2016 Discharge Date 01/31/2016 Primary MD No PCP Per Patient Admitting Physician Lance Coon, MD  Admission Diagnosis  Atrial arrhythmia [I49.8] Hypokalemia [E87.6] Generalized abdominal pain [R10.84] Prolonged Q-T interval on ECG [R94.31] Parkinson's disease ([place]) [G20] Constipation, unspecified constipation type [K59.00] Nausea and vomiting, intractability of vomiting not specified, unspecified vomiting type [R11.2]  Discharge Diagnosis   Principal Problem:   Colitis localized related to severe constipation   Type 2 diabetes mellitus ([place])   BP (high blood pressure)   Parkinson's disease ([place])   Atrial fibrillation ([place])   HLD (hyperlipidemia)   Anxiety   Hypokalemia           Hospital Course   Charles Stanton  is a 61 y.o. male who presents with Abdominal pain, constipation, nausea. Evaluation in the ED showed hypokalemia, elevated white count, and on CT scan significant lower GI tract stool burden with some inflammation and bowel wall thickening distally. Patient was covered with antibiotics in the ED, and hospitalists were called for admission.  Pt was started on bowel regimen he started to have bowel movents and then diarrhea. His abdominal pain is resolved he was seen by GI. He is doing much better. He does have a history of all abuse and strongly recommended that he stop drinking.         Consults  GI  Significant Tests:  See full reports for all details     Dg Chest 2 View  Result Date: 01/27/2016 CLINICAL DATA:  61 year old male with cough.  History of AFib. EXAM: CHEST  2 VIEW COMPARISON:  Chest CT dated 11/27/2008 FINDINGS: There are bibasilar hazy densities which may represent atelectatic changes versus infiltrate. No significant pleural effusion or pneumothorax. The cardiac borders are not well visualized and silhouetted by the  adjacent soft tissues. There is osteopenia with degenerative changes of the spine. There is compression fracture of a mid to lower thoracic vertebra with anterior wedging which is age indeterminate. There is also compression fracture of an upper lumbar vertebra with anterior wedging. Clinical correlation is recommended. IMPRESSION: Bibasilar hazy densities may represent atelectasis versus infiltrate. Clinical correlation is recommended. Osteopenia with age indeterminate compression deformities of a mid to lower thoracic and a lumbar vertebra. Correlation with clinical exam and point tenderness recommended. Electronically Signed   By: Anner Crete M.D.   On: 01/27/2016 21:21   Ct Abdomen Pelvis W Contrast  Result Date: 01/27/2016 CLINICAL DATA:  Three day history of abdominal pain. EXAM: CT ABDOMEN AND PELVIS WITH CONTRAST TECHNIQUE: Multidetector CT imaging of the abdomen and pelvis was performed using the standard protocol following bolus administration of intravenous contrast. CONTRAST:  18mL ISOVUE-300 IOPAMIDOL (ISOVUE-300) INJECTION 61% COMPARISON:  11/27/2008 FINDINGS: Lower chest: Compressive atelectasis noted lower lungs, right greater than left. Heart is enlarged with coronary artery calcification. Hepatobiliary: Geographic fatty deposition noted within the liver parenchyma. There is no evidence for gallstones, gallbladder wall thickening, or pericholecystic fluid. No intrahepatic or extrahepatic biliary dilation. Pancreas: No focal mass lesion. No dilatation of the main duct. No intraparenchymal cyst. No peripancreatic edema. Spleen: No splenomegaly. No focal mass lesion. Adrenals/Urinary Tract: No adrenal nodule or mass. Kidneys are unremarkable. No evidence for hydroureter. The urinary bladder appears normal for the degree of distention. Stomach/Bowel: Small to moderate hiatal hernia noted. Stomach otherwise unremarkable. Duodenum is normally positioned as is the ligament of Treitz. No small  bowel  wall thickening. No small bowel dilatation. The terminal ileum is normal. The appendix is normal. Prominent stool volume distal colon with large stool volume noted in the rectum. Question circumferential wall thickening in the distal most aspect of the rectum, but not a definite finding. Vascular/Lymphatic: There is abdominal aortic atherosclerosis without aneurysm. There is no gastrohepatic or hepatoduodenal ligament lymphadenopathy. No intraperitoneal or retroperitoneal lymphadenopathy. No pelvic sidewall lymphadenopathy. Reproductive: The prostate gland and seminal vesicles have normal imaging features. Other: No intraperitoneal free fluid. Musculoskeletal: There is some nonspecific perirectal edema with extraperitoneal mild edema seen in the anterior left pelvic sidewall, indeterminate. Compression fracture at L2 appears acute and results and 50-75% loss of height anteriorly with less than 25% loss of height posteriorly. Compression deformity at T9 is age-indeterminate. Old bilateral rib fractures are evident. IMPRESSION: 1. Prominent stool volume in the distal colon and rectum. There is some collapse crescent there is a question of circumferential wall thickening in the distal most portion of the rectum with mild perirectal edema. 2. Acute L2 compression fracture with age-indeterminate fracture seen at T9. 3. Geographic fatty deposition in the liver parenchyma. Electronically Signed   By: Misty Stanley M.D.   On: 01/27/2016 21:07   Dg Abd 2 Views  Result Date: 01/30/2016 CLINICAL DATA:  Abdominal pain, constipation and colitis. Subsequent encounter. EXAM: ABDOMEN - 2 VIEW COMPARISON:  CT, 01/27/2016 FINDINGS: Normal bowel gas pattern with no evidence of bowel obstruction or significant generalized adynamic ileus. No free air. No convincing renal or ureteral stones. Opacity at the lung bases has increased from the prior study, likely combination of small effusions and atelectasis. IMPRESSION: 1. No  acute findings in the abdomen. No evidence of bowel obstruction or free air. 2. Increased opacity at the lung bases most likely combination of small effusions and atelectasis. Electronically Signed   By: Lajean Manes M.D.   On: 01/30/2016 10:13       Today   Subjective:   Charles Stanton  Pt feeling better wants to go home  Objective:   Blood pressure (!) 144/87, pulse 79, temperature 98.2 F (36.8 C), temperature source Oral, resp. rate 18, height 5\' 4"  (1.626 m), weight 153 lb 11.2 oz (69.7 kg), SpO2 96 %.  .  Intake/Output Summary (Last 24 hours) at 01/31/16 1252 Last data filed at 01/31/16 1153  Gross per 24 hour  Intake              960 ml  Output              550 ml  Net              410 ml    Exam VITAL SIGNS: Blood pressure (!) 144/87, pulse 79, temperature 98.2 F (36.8 C), temperature source Oral, resp. rate 18, height 5\' 4"  (1.626 m), weight 153 lb 11.2 oz (69.7 kg), SpO2 96 %.  GENERAL:  62 y.o.-year-old patient lying in the bed with no acute distress.  EYES: Pupils equal, round, reactive to light and accommodation. No scleral icterus. Extraocular muscles intact.  HEENT: Head atraumatic, normocephalic. Oropharynx and nasopharynx clear.  NECK:  Supple, no jugular venous distention. No thyroid enlargement, no tenderness.  LUNGS: Normal breath sounds bilaterally, no wheezing, rales,rhonchi or crepitation. No use of accessory muscles of respiration.  CARDIOVASCULAR: S1, S2 normal. No murmurs, rubs, or gallops.  ABDOMEN: Soft, nontender, nondistended. Bowel sounds present. No organomegaly or mass.  EXTREMITIES: No pedal edema, cyanosis, or clubbing.  NEUROLOGIC: Cranial nerves II  through XII are intact. Muscle strength 5/5 in all extremities. Sensation intact. Gait not checked.  PSYCHIATRIC: The patient is alert and oriented x 3.  SKIN: No obvious rash, lesion, or ulcer.   Data Review     CBC w Diff: Lab Results  Component Value Date   WBC 11.0 (H) 01/31/2016   HGB  10.1 (L) 01/31/2016   HCT 29.9 (L) 01/31/2016   PLT 461 (H) 01/31/2016   LYMPHOPCT 8 01/30/2016   MONOPCT 6 01/30/2016   EOSPCT 4 01/30/2016   BASOPCT 1 01/30/2016   CMP: Lab Results  Component Value Date   NA 139 01/31/2016   K 3.9 01/31/2016   CL 106 01/31/2016   CO2 28 01/31/2016   BUN 6 01/31/2016   CREATININE 0.42 (L) 01/31/2016   PROT 4.9 (L) 01/28/2016   ALBUMIN 2.5 (L) 01/28/2016   BILITOT 0.7 01/28/2016   ALKPHOS 143 (H) 01/28/2016   AST 29 01/28/2016   ALT 6 (L) 01/28/2016  .  Micro Results No results found for this or any previous visit (from the past 240 hour(s)).      Code Status Orders        Start     Ordered   01/28/16 0123  Full code  Continuous     01/28/16 0122    Code Status History    Date Active Date Inactive Code Status Order ID Comments User Context   This patient has a current code status but no historical code status.          Follow-up Information    [facility].   Specialty:  Home Health Services Why:  Home health nurse, physical therapy occuaptional therapy Contact information: [place] [address] [phone]           Discharge Medications   Allergies as of 01/31/2016      Reactions   Amantadine Other (See Comments)   fatigue sleepy      Medication List    TAKE these medications   amoxicillin-clavulanate 875-125 MG tablet Commonly known as:  AUGMENTIN Take 1 tablet by mouth every 12 (twelve) hours.   carbidopa-levodopa 25-100 MG tablet Commonly known as:  SINEMET IR Take 1 tablet by mouth 3 (three) times daily.   hydrochlorothiazide 25 MG tablet Commonly known as:  HYDRODIURIL Take 25 mg by mouth daily.   lisinopril 20 MG tablet Commonly known as:  PRINIVIL,ZESTRIL Take 20 mg by mouth 2 (two) times daily.   sertraline 50 MG tablet Commonly known as:  ZOLOFT Take 1 tablet (50 mg total) by mouth daily.   simvastatin 20 MG  tablet Commonly known as:  ZOCOR Take 20 mg by mouth daily. Reported on 04/14/2015   traZODone 100 MG tablet Commonly known as:  DESYREL Take 1 tablet (100 mg total) by mouth at bedtime.   Vitamin D (Ergocalciferol) 50000 units Caps capsule Commonly known as:  DRISDOL Take 50,000 Units by mouth every 7 (seven) days.          Total Time in preparing paper work, data evaluation and todays exam - 35 minutes  Dustin Flock M.D on 01/31/2016 at 12:52 PM  [facility]   Office  [phone]

## 2016-01-31 NOTE — Progress Notes (Signed)
Patient is being discharge home, discharged instruction provided, iv removed tele removed

## 2016-01-31 NOTE — Progress Notes (Signed)
Patient had an episode of hallucination about midnight. He stated to nurse tech that he was seeing double and asked nurse if he could speak with his brother. He called his brother and determined he was having a bad dream. Patient attempted to get out of bed twice but was easily redirected. 15 minutes later, patient is now resting in bed without any complications.

## 2016-01-31 NOTE — Progress Notes (Signed)
Ambulate with patient, iv lasix administer, as per order,

## 2016-01-31 NOTE — Progress Notes (Signed)
Patient presently resting in the bed, remains alert and oriented, denies any pain, vss, no episode of hallucination family at bedside, rounded with MD. Patient updated about his condition and possible discharge today,

## 2016-01-31 NOTE — Discharge Instructions (Signed)
[place] at [place]:  Cardiac diet  DISCHARGE CONDITION:  Stable  ACTIVITY:  Activity as tolerated  OXYGEN:  Home Oxygen: No.   Oxygen Delivery: room air  DISCHARGE LOCATION:  home    ADDITIONAL DISCHARGE INSTRUCTION: stop drinking   If you experience worsening of your admission symptoms, develop shortness of breath, life threatening emergency, suicidal or homicidal thoughts you must seek medical attention immediately by calling 911 or calling your MD immediately  if symptoms less severe.  You Must read complete instructions/literature along with all the possible adverse reactions/side effects for all the Medicines you take and that have been prescribed to you. Take any new Medicines after you have completely understood and accpet all the possible adverse reactions/side effects.   Please note  You were cared for by a hospitalist during your hospital stay. If you have any questions about your discharge medications or the care you received while you were in the hospital after you are discharged, you can call the unit and asked to speak with the hospitalist on call if the hospitalist that took care of you is not available. Once you are discharged, your primary care physician will handle any further medical issues. Please note that NO REFILLS for any discharge medications will be authorized once you are discharged, as it is imperative that you return to your primary care physician (or establish a relationship with a primary care physician if you do not have one) for your aftercare needs so that they can reassess your need for medications and monitor your lab values.

## 2016-02-01 LAB — PROTEIN ELECTROPHORESIS, SERUM
A/G Ratio: 1 (ref 0.7–1.7)
Albumin ELP: 2.7 g/dL — ABNORMAL LOW (ref 2.9–4.4)
Alpha-1-Globulin: 0.3 g/dL (ref 0.0–0.4)
Alpha-2-Globulin: 0.6 g/dL (ref 0.4–1.0)
Beta Globulin: 0.8 g/dL (ref 0.7–1.3)
Gamma Globulin: 1 g/dL (ref 0.4–1.8)
Globulin, Total: 2.7 g/dL (ref 2.2–3.9)
Total Protein ELP: 5.4 g/dL — ABNORMAL LOW (ref 6.0–8.5)

## 2016-02-02 NOTE — Progress Notes (Signed)
Please schedule home health, Skilled nursing PT and OT

## 2016-02-08 ENCOUNTER — Ambulatory Visit (INDEPENDENT_AMBULATORY_CARE_PROVIDER_SITE_OTHER): Payer: Federal, State, Local not specified - PPO | Admitting: Psychiatry

## 2016-02-08 ENCOUNTER — Encounter: Payer: Self-pay | Admitting: Psychiatry

## 2016-02-08 VITALS — BP 142/97 | HR 80 | Wt 150.6 lb

## 2016-02-08 DIAGNOSIS — F39 Unspecified mood [affective] disorder: Secondary | ICD-10-CM | POA: Diagnosis not present

## 2016-02-08 DIAGNOSIS — G20 Parkinson's disease: Secondary | ICD-10-CM

## 2016-02-08 DIAGNOSIS — G20A1 Parkinson's disease without dyskinesia, without mention of fluctuations: Secondary | ICD-10-CM

## 2016-02-08 DIAGNOSIS — C4491 Basal cell carcinoma of skin, unspecified: Secondary | ICD-10-CM | POA: Insufficient documentation

## 2016-02-08 MED ORDER — SERTRALINE HCL 50 MG PO TABS: 75.0000 mg | ORAL_TABLET | Freq: Every day | ORAL | 1 refills | Status: DC

## 2016-02-08 MED ORDER — TRAZODONE HCL 100 MG PO TABS: 100.0000 mg | ORAL_TABLET | Freq: Every evening | ORAL | 1 refills | Status: DC | PRN

## 2016-02-08 NOTE — Progress Notes (Signed)
Psychiatric MD Progress Note   Patient Identification: Charles Stanton MRN:  [id number] Date of Evaluation:  02/08/2016 Referral Source: [name]  Chief Complaint:   Chief Complaint    Follow-up; Medication Refill     Visit Diagnosis:    ICD-9-CM ICD-10-CM   1. Episodic mood disorder ([place]) 296.90 F39   2. Parkinson disease ([place]) 332.0 G20     History of Present Illness:    Patient is a 61 year old male who presented for follow-up. He has history of Parkinson's disease and presented for the follow-up appointment. Patient  appeared very weak and he reported that he was sick for the last 2 months and was in the hospital due to chronic constipation. He reported that he was recently discharged. Patient also had a removal of the basal cell carcinoma from his face. He reported that he is being followed by the home health at this time. He reported that he was using contact lenses but now they were removed and he has started wearing glasses. Patient reported that today is the first time that he decided to drive the car by himself as he wanted to come to this appointment. He reported that he has started heating healthy and is trying to improve his diet as he wants to gain weight. His wife is supportive. 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 He reported that he is only going to drink on special occasions. He appeared calm and alert during the interview. He denied suicidal ideations or plans. He reported that he wants to increase the dose of Zoloft as he feels that he continues to feel depressed at this time. He is not taking trazodone at this time as he reported that his sleep has improved. We discussed about his medications and he agreed with the plan.   He appeared pleasant and cooperative during the interview.  Associated Signs/Symptoms: Depression Symptoms:  depressed mood, psychomotor retardation, anxiety, disturbed  sleep, (Hypo) Manic Symptoms:  Irritable Mood, Labiality of Mood, Anxiety Symptoms:  Excessive Worry, Psychotic Symptoms:  none PTSD Symptoms: Negative NA  Past Psychiatric History: Patient reported that he has never seen a psychiatrist in the past. He denied any history of suicide attempts. He denied any history of psychiatric hospitalization.   Previous Psychotropic Medications:  Celexa Trazodone  Substance Abuse History in the last 12 months:  Yes.   Drinking alcohol on a daily basis  Consequences of Substance Abuse: Negative NA  Past Medical History:  Past Medical History:  Diagnosis Date  . Anxiety   . Hyperlipemia   . Hypertension   . Palpitations   . Parkinson's disease [name])     Past Surgical History:  Procedure Laterality Date  . skin cancer removed    . TONSILLECTOMY      Family Psychiatric History:  Mother- Dementia Father- anxiety   Family History:  Family History  Problem Relation Age of Onset  . Heart Problems Mother   . Dementia Mother     Social History:   Social History   Social History  . Marital status: Married    Spouse name: N/A  . Number of children: N/A  . Years of education: N/A   Social History Main Topics  . Smoking status: Current Some Day Smoker  . Smokeless tobacco: Never Used  . Alcohol use Yes  . Drug use: No  . Sexual activity: Yes   Other Topics Concern  . None  Social History Narrative  . 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    Allergies:   Allergies  Allergen Reactions  . Amantadine Other (See Comments)    fatigue sleepy    Metabolic Disorder Labs: Lab Results  Component Value Date   HGBA1C 6.5 (H) 01/28/2016   MPG 140 01/28/2016   No results found for: PROLACTIN No results found for: CHOL, TRIG, HDL, CHOLHDL, VLDL,  LDLCALC   Current Medications: Current Outpatient Prescriptions  Medication Sig Dispense Refill  . carbidopa-levodopa (SINEMET IR) 25-100 MG per tablet Take 1 tablet by mouth 3 (three) times daily.    . hydrochlorothiazide (HYDRODIURIL) 25 MG tablet Take 25 mg by mouth daily.    Marland Kitchen lisinopril (PRINIVIL,ZESTRIL) 20 MG tablet Take 20 mg by mouth 2 (two) times daily.    . sertraline (ZOLOFT) 50 MG tablet Take 1.5 tablets (75 mg total) by mouth daily. 135 tablet 1  . simvastatin (ZOCOR) 20 MG tablet Take 20 mg by mouth daily. Reported on 04/14/2015    . traZODone (DESYREL) 100 MG tablet Take 1 tablet (100 mg total) by mouth at bedtime as needed for sleep. Pt has supply- not taking 90 tablet 1  . Vitamin D, Ergocalciferol, (DRISDOL) 50000 UNITS CAPS capsule Take 50,000 Units by mouth every 7 (seven) days.     No current facility-administered medications for this visit.     Neurologic: Headache: No Seizure: No Paresthesias:No  Musculoskeletal: Strength & Muscle Tone: within normal limits Gait & Station: unsteady Patient leans: N/A  Psychiatric Specialty Exam: Review of Systems  Constitutional: Positive for weight loss.  Neurological: Positive for tremors, focal weakness and weakness.  Psychiatric/Behavioral: Positive for depression. The patient is nervous/anxious and has insomnia.   All other systems reviewed and are negative.   Blood pressure (!) 142/97, pulse 80, weight 150 lb 9.6 oz (68.3 kg).Body mass index is 25.85 kg/m.  General Appearance: Casual  Eye Contact:  Fair  Speech:  Clear and Coherent  Volume:  Normal  Mood:  Anxious  Affect:  Congruent  Thought Process:  Coherent  Orientation:  Full (Time, Place, and Person)  Thought Content:  WDL  Suicidal Thoughts:  No  Homicidal Thoughts:  No  Memory:  Immediate;   Fair  Judgement:  Fair  Insight:  Fair  Psychomotor Activity:  Normal  Concentration:  Fair  Recall:  [employer] of Knowledge:Fair  Language: Fair   Akathisia:  No  Handed:  Right  AIMS (if indicated):    Assets:  Communication Skills Desire for Improvement Social Support  ADL's:  Intact  Cognition: WNL  Sleep:      Treatment Plan Summary: Medication management   Discussed with patient at length about his medications treatment risks benefits  I will titrate the dose of Zoloft 75 mg by mouth daily. He gets 90 day supply at a time. He is not taking trazodone on a regular basis. He has supply. Follow-up in 3 months or earlier depending on his symptoms.    More than 50% of the time spent in psychoeducation, counseling and coordination of care.    This note was generated in part or whole with voice recognition software. Voice regonition is usually quite accurate but there are transcription errors that can and very often do occur. I  apologize for any typographical errors that were not detected and corrected.   Rainey Pines, MD 2/5/201811:11 AM

## 2016-02-09 ENCOUNTER — Encounter
Admission: RE | Admit: 2016-02-09 | Discharge: 2016-02-09 | Disposition: A | Discharge status: Home / Self Care | Payer: Federal, State, Local not specified - PPO | Source: Ambulatory Visit | Attending: Orthopedic Surgery | Admitting: Orthopedic Surgery

## 2016-02-09 DIAGNOSIS — M4854XA Collapsed vertebra, not elsewhere classified, thoracic region, initial encounter for fracture: Secondary | ICD-10-CM | POA: Insufficient documentation

## 2016-02-09 DIAGNOSIS — S22070A Wedge compression fracture of T9-T10 vertebra, initial encounter for closed fracture: Secondary | ICD-10-CM | POA: Diagnosis not present

## 2016-02-09 DIAGNOSIS — M4856XA Collapsed vertebra, not elsewhere classified, lumbar region, initial encounter for fracture: Secondary | ICD-10-CM

## 2016-02-09 DIAGNOSIS — Y9389 Activity, other specified: Secondary | ICD-10-CM | POA: Diagnosis not present

## 2016-02-09 DIAGNOSIS — G20 Parkinson's disease: Secondary | ICD-10-CM | POA: Diagnosis not present

## 2016-02-09 DIAGNOSIS — Y92000 Kitchen of unspecified non-institutional (private) residence as  the place of occurrence of the external cause: Secondary | ICD-10-CM | POA: Diagnosis not present

## 2016-02-09 DIAGNOSIS — Z01812 Encounter for preprocedural laboratory examination: Secondary | ICD-10-CM

## 2016-02-09 DIAGNOSIS — E785 Hyperlipidemia, unspecified: Secondary | ICD-10-CM | POA: Diagnosis not present

## 2016-02-09 DIAGNOSIS — R002 Palpitations: Secondary | ICD-10-CM | POA: Diagnosis not present

## 2016-02-09 DIAGNOSIS — Z888 Allergy status to other drugs, medicaments and biological substances status: Secondary | ICD-10-CM | POA: Diagnosis not present

## 2016-02-09 DIAGNOSIS — I10 Essential (primary) hypertension: Secondary | ICD-10-CM | POA: Diagnosis not present

## 2016-02-09 DIAGNOSIS — S32020A Wedge compression fracture of second lumbar vertebra, initial encounter for closed fracture: Secondary | ICD-10-CM | POA: Diagnosis present

## 2016-02-09 DIAGNOSIS — W19XXXA Unspecified fall, initial encounter: Secondary | ICD-10-CM | POA: Diagnosis not present

## 2016-02-09 HISTORY — DX: Other specified cough: R05.8

## 2016-02-09 HISTORY — DX: Cough: R05

## 2016-02-09 HISTORY — DX: Tremor, unspecified: R25.1

## 2016-02-09 HISTORY — DX: Anemia, unspecified: D64.9

## 2016-02-09 HISTORY — DX: Other specified diseases of intestine: K63.89

## 2016-02-09 LAB — SURGICAL PCR SCREEN
MRSA, PCR: POSITIVE — AB
Staphylococcus aureus: POSITIVE — AB

## 2016-02-09 NOTE — Patient Instructions (Signed)
  Your procedure is scheduled on: 02/09/16 Thurs Report to Same Day Surgery 2nd floor medical mall [facility] Entrance-take elevator on left to 2nd floor.  Check in with surgery information desk.) To find out your arrival time please call [phone] between 1PM - 3PM on 02/08/16 Wed  Remember: Instructions that are not followed completely may result in serious medical risk, up to and including death, or upon the discretion of your surgeon and anesthesiologist your surgery may need to be rescheduled.    _x___ 1. Do not eat food or drink liquids after midnight. No gum chewing or hard candies.     __x__ 2. No Alcohol for 24 hours before or after surgery.   __x__3. No Smoking for 24 prior to surgery.   ____  4. Bring all medications with you on the day of surgery if instructed.    __x__ 5. Notify your doctor if there is any change in your medical condition     (cold, fever, infections).     Do not wear jewelry, make-up, hairpins, clips or nail polish.  Do not wear lotions, powders, or perfumes. You may wear deodorant.  Do not shave 48 hours prior to surgery. Men may shave face and neck.  Do not bring valuables to the hospital.    [facility] is not responsible for any belongings or valuables.               Contacts, dentures or bridgework may not be worn into surgery.  Leave your suitcase in the car. After surgery it may be brought to your room.  For patients admitted to the hospital, discharge time is determined by your treatment team.   Patients discharged the day of surgery will not be allowed to drive home.  You will need someone to drive you home and stay with you the night of your procedure.    Please read over the following fact sheets that you were given:   [facility] Preparing for Surgery and or MRSA Information   _x___ Take these medicines the morning of surgery with A SIP OF WATER:    1. carbidopa-levodopa (SINEMET IR)   2.lisinopril (PRINIVIL,ZESTRIL  3.sertraline  (ZOLOFT  4.  5.  6.  ____Fleets enema or Magnesium Citrate as directed.   _x___ Use CHG Soap or sage wipes as directed on instruction sheet   ____ Use inhalers on the day of surgery and bring to hospital day of surgery  ____ Stop metformin 2 days prior to surgery    ____ Take 1/2 of usual insulin dose the night before surgery and none on the morning of           surgery.   ____ Stop Aspirin, Coumadin, Pllavix ,Eliquis, Effient, or Pradaxa  x__ Stop Anti-inflammatories such as Advil, Aleve, Ibuprofen, Motrin, Naproxen,          Naprosyn, Goodies powders or aspirin products. Ok to take Tylenol.   ____ Stop supplements until after surgery.    ____ Bring C-Pap to the hospital.

## 2016-02-10 NOTE — Pre-Procedure Instructions (Addendum)
POSITIVE MRSA AND STAPH CALLED TO [name] AT DR [facility]  NO TREATMENT BY [name] AT DR [facility]

## 2016-02-11 ENCOUNTER — Ambulatory Visit: Payer: Federal, State, Local not specified - PPO | Admitting: Certified Registered Nurse Anesthetist

## 2016-02-11 ENCOUNTER — Ambulatory Visit: Payer: Federal, State, Local not specified - PPO

## 2016-02-11 ENCOUNTER — Ambulatory Visit
Admission: RE | Admit: 2016-02-11 | Discharge: 2016-02-11 | Disposition: A | Discharge status: Home / Self Care | Payer: Federal, State, Local not specified - PPO | Source: Ambulatory Visit | Attending: Orthopedic Surgery | Admitting: Orthopedic Surgery

## 2016-02-11 ENCOUNTER — Encounter: Payer: Self-pay | Admitting: *Deleted

## 2016-02-11 ENCOUNTER — Encounter
Admission: RE | Disposition: A | Discharge status: Home / Self Care | Payer: Self-pay | Source: Ambulatory Visit | Attending: Orthopedic Surgery

## 2016-02-11 DIAGNOSIS — S32020A Wedge compression fracture of second lumbar vertebra, initial encounter for closed fracture: Secondary | ICD-10-CM | POA: Insufficient documentation

## 2016-02-11 DIAGNOSIS — S22070A Wedge compression fracture of T9-T10 vertebra, initial encounter for closed fracture: Secondary | ICD-10-CM | POA: Insufficient documentation

## 2016-02-11 DIAGNOSIS — Z419 Encounter for procedure for purposes other than remedying health state, unspecified: Secondary | ICD-10-CM

## 2016-02-11 DIAGNOSIS — G20 Parkinson's disease: Secondary | ICD-10-CM | POA: Insufficient documentation

## 2016-02-11 DIAGNOSIS — Y9389 Activity, other specified: Secondary | ICD-10-CM | POA: Insufficient documentation

## 2016-02-11 DIAGNOSIS — E785 Hyperlipidemia, unspecified: Secondary | ICD-10-CM | POA: Insufficient documentation

## 2016-02-11 DIAGNOSIS — W19XXXA Unspecified fall, initial encounter: Secondary | ICD-10-CM | POA: Insufficient documentation

## 2016-02-11 DIAGNOSIS — I10 Essential (primary) hypertension: Secondary | ICD-10-CM | POA: Insufficient documentation

## 2016-02-11 DIAGNOSIS — R002 Palpitations: Secondary | ICD-10-CM | POA: Insufficient documentation

## 2016-02-11 DIAGNOSIS — Z888 Allergy status to other drugs, medicaments and biological substances status: Secondary | ICD-10-CM | POA: Insufficient documentation

## 2016-02-11 DIAGNOSIS — Y92000 Kitchen of unspecified non-institutional (private) residence as  the place of occurrence of the external cause: Secondary | ICD-10-CM | POA: Insufficient documentation

## 2016-02-11 HISTORY — PX: KYPHOPLASTY: SHX5884

## 2016-02-11 SURGERY — KYPHOPLASTY
Anesthesia: General

## 2016-02-11 MED ORDER — HYDROCODONE-ACETAMINOPHEN 5-325 MG PO TABS: 1.0000 | ORAL_TABLET | ORAL | 0 refills | Status: DC | PRN

## 2016-02-11 MED ORDER — OXYCODONE HCL 5 MG PO TABS: 5.0000 mg | ORAL_TABLET | Freq: Once | ORAL | Status: DC | PRN

## 2016-02-11 MED ORDER — DEXAMETHASONE SODIUM PHOSPHATE 10 MG/ML IJ SOLN
INTRAMUSCULAR | Status: AC
  Filled 2016-02-11: qty 1

## 2016-02-11 MED ORDER — LIDOCAINE HCL (PF) 2 % IJ SOLN
INTRAMUSCULAR | Status: AC
Start: 2016-02-11 — End: 2016-02-11
  Filled 2016-02-11: qty 2

## 2016-02-11 MED ORDER — FAMOTIDINE 20 MG PO TABS
20.0000 mg | ORAL_TABLET | Freq: Once | ORAL | Status: AC
  Administered 2016-02-11: 20 mg via ORAL

## 2016-02-11 MED ORDER — BUPIVACAINE-EPINEPHRINE (PF) 0.5% -1:200000 IJ SOLN
INTRAMUSCULAR | Status: DC | PRN
  Administered 2016-02-11: 30 mL

## 2016-02-11 MED ORDER — LIDOCAINE HCL 1 % IJ SOLN
INTRAMUSCULAR | Status: DC | PRN
  Administered 2016-02-11: 40 mL

## 2016-02-11 MED ORDER — PROPOFOL 500 MG/50ML IV EMUL
INTRAVENOUS | Status: AC
  Filled 2016-02-11: qty 50

## 2016-02-11 MED ORDER — OXYCODONE HCL 5 MG/5ML PO SOLN: 5.0000 mg | Freq: Once | ORAL | Status: DC | PRN

## 2016-02-11 MED ORDER — PROMETHAZINE HCL 25 MG/ML IJ SOLN: 6.2500 mg | INTRAMUSCULAR | Status: DC | PRN

## 2016-02-11 MED ORDER — FENTANYL CITRATE (PF) 100 MCG/2ML IJ SOLN
INTRAMUSCULAR | Status: DC | PRN
  Administered 2016-02-11 (×2): 50 ug via INTRAVENOUS

## 2016-02-11 MED ORDER — FENTANYL CITRATE (PF) 100 MCG/2ML IJ SOLN: 25.0000 ug | INTRAMUSCULAR | Status: DC | PRN

## 2016-02-11 MED ORDER — FENTANYL CITRATE (PF) 100 MCG/2ML IJ SOLN
INTRAMUSCULAR | Status: AC
  Filled 2016-02-11: qty 2

## 2016-02-11 MED ORDER — IOPAMIDOL (ISOVUE-M 200) INJECTION 41%
INTRAMUSCULAR | Status: AC
  Filled 2016-02-11: qty 10

## 2016-02-11 MED ORDER — MIDAZOLAM HCL 2 MG/2ML IJ SOLN
INTRAMUSCULAR | Status: AC
  Filled 2016-02-11: qty 2

## 2016-02-11 MED ORDER — CEFAZOLIN SODIUM-DEXTROSE 2-4 GM/100ML-% IV SOLN
2.0000 g | Freq: Once | INTRAVENOUS | Status: AC
  Administered 2016-02-11: 2 g via INTRAVENOUS

## 2016-02-11 MED ORDER — LACTATED RINGERS IV SOLN
INTRAVENOUS | Status: DC
  Administered 2016-02-11: 15:00:00 via INTRAVENOUS

## 2016-02-11 MED ORDER — IOPAMIDOL (ISOVUE-M 200) INJECTION 41%
INTRAMUSCULAR | Status: DC | PRN
  Administered 2016-02-11: 60 mL

## 2016-02-11 MED ORDER — BUPIVACAINE-EPINEPHRINE (PF) 0.5% -1:200000 IJ SOLN
INTRAMUSCULAR | Status: AC
  Filled 2016-02-11: qty 30

## 2016-02-11 MED ORDER — MEPERIDINE HCL 25 MG/ML IJ SOLN
INTRAMUSCULAR | Status: AC
  Administered 2016-02-11: 12.5 mg via INTRAVENOUS
  Filled 2016-02-11: qty 1

## 2016-02-11 MED ORDER — PROPOFOL 500 MG/50ML IV EMUL
INTRAVENOUS | Status: DC | PRN
  Administered 2016-02-11: 100 ug/kg/min via INTRAVENOUS

## 2016-02-11 MED ORDER — LIDOCAINE HCL (PF) 1 % IJ SOLN
INTRAMUSCULAR | Status: AC
  Filled 2016-02-11: qty 60

## 2016-02-11 MED ORDER — PROPOFOL 10 MG/ML IV BOLUS
INTRAVENOUS | Status: DC | PRN
  Administered 2016-02-11: 24 mg via INTRAVENOUS

## 2016-02-11 MED ORDER — PROPOFOL 10 MG/ML IV BOLUS
INTRAVENOUS | Status: AC
  Filled 2016-02-11: qty 20

## 2016-02-11 MED ORDER — CEFAZOLIN SODIUM-DEXTROSE 2-4 GM/100ML-% IV SOLN
INTRAVENOUS | Status: AC
  Filled 2016-02-11: qty 100

## 2016-02-11 MED ORDER — FAMOTIDINE 20 MG PO TABS
ORAL_TABLET | ORAL | Status: AC
  Filled 2016-02-11: qty 1

## 2016-02-11 MED ORDER — LIDOCAINE HCL (CARDIAC) 20 MG/ML IV SOLN
INTRAVENOUS | Status: DC | PRN
  Administered 2016-02-11: 12 mg via INTRAVENOUS

## 2016-02-11 MED ORDER — MEPERIDINE HCL 25 MG/ML IJ SOLN
6.2500 mg | INTRAMUSCULAR | Status: DC | PRN
  Administered 2016-02-11 (×2): 12.5 mg via INTRAVENOUS

## 2016-02-11 MED ORDER — MIDAZOLAM HCL 2 MG/2ML IJ SOLN
INTRAMUSCULAR | Status: DC | PRN
  Administered 2016-02-11: 2 mg via INTRAVENOUS

## 2016-02-11 MED ORDER — IOPAMIDOL (ISOVUE-M 200) INJECTION 41%
INTRAMUSCULAR | Status: AC
  Filled 2016-02-11: qty 20

## 2016-02-11 MED ORDER — ONDANSETRON HCL 4 MG/2ML IJ SOLN
INTRAMUSCULAR | Status: AC
  Filled 2016-02-11: qty 2

## 2016-02-11 SURGICAL SUPPLY — 19 items
BNDG ADH 2 X3.75 FABRIC TAN LF (GAUZE/BANDAGES/DRESSINGS) ×6 IMPLANT
CEMENT BONE KYPHON CDS (Cement) ×2 IMPLANT
DEVICE BIOPSY BONE KYPH ×2 IMPLANT
DEVICE BIOPSY BONE KYPHX (INSTRUMENTS) ×2 IMPLANT
DRAPE C-ARM XRAY 36X54 (DRAPES) ×2 IMPLANT
DURAPREP 26ML APPLICATOR (WOUND CARE) ×2 IMPLANT
GLOVE SURG SYN 9.0  PF PI (GLOVE) ×1
GLOVE SURG SYN 9.0 PF PI (GLOVE) ×1 IMPLANT
GOWN SRG 2XL LVL 4 RGLN SLV (GOWNS) ×1 IMPLANT
GOWN STRL NON-REIN 2XL LVL4 (GOWNS) ×1
GOWN STRL REUS W/ TWL LRG LVL3 (GOWN DISPOSABLE) ×1 IMPLANT
GOWN STRL REUS W/TWL LRG LVL3 (GOWN DISPOSABLE) ×1
KYPHON BONE BIOPSY SZ 2 IMPLANT
LIQUID BAND (GAUZE/BANDAGES/DRESSINGS) ×2 IMPLANT
PACK KYPHOPLASTY (MISCELLANEOUS) ×2 IMPLANT
STRAP SAFETY BODY (MISCELLANEOUS) ×2 IMPLANT
TRAY KYPHOPAK 15/2 EXPRESS (KITS) ×2 IMPLANT
TRAY KYPHOPAK 15/3 EXPRESS 1ST (MISCELLANEOUS) IMPLANT
TRAY KYPHOPAK 20/3 EXPRESS 1ST (MISCELLANEOUS) ×2 IMPLANT

## 2016-02-11 NOTE — OR Nursing (Signed)
Dr. Randa Lynn notified by Idamae Lusher, RN that pt advises he did not take his sinemet this am as instructed

## 2016-02-11 NOTE — Anesthesia Postprocedure Evaluation (Signed)
Anesthesia Post Note  Patient: Charles Stanton  Procedure(s) Performed: Procedure(s) (LRB): KYPHOPLASTY  L2,T9 (N/A)  Patient location during evaluation: PACU Anesthesia Type: General Level of consciousness: awake and alert and oriented Pain management: pain level controlled Vital Signs Assessment: post-procedure vital signs reviewed and stable Respiratory status: spontaneous breathing, nonlabored ventilation and respiratory function stable Cardiovascular status: blood pressure returned to baseline and stable Postop Assessment: no signs of nausea or vomiting Anesthetic complications: no     Last Vitals:  Vitals:   02/11/16 1747 02/11/16 1814  BP: (!) 145/86 140/83  Pulse: 77 100  Resp: 18 18  Temp: 36.9 C     Last Pain:  Vitals:   02/11/16 1814  TempSrc:   PainSc: 0-No pain                 Burnis Halling

## 2016-02-11 NOTE — Anesthesia Post-op Follow-up Note (Cosign Needed)
Anesthesia QCDR form completed.        

## 2016-02-11 NOTE — OR Nursing (Signed)
Dr. Rudene Christians in to see pt 1450 - aware re positive MRSA and positive STAPH, advises he does not want to change abx - will keep it at [facility]

## 2016-02-11 NOTE — H&P (Signed)
Reviewed paper H+P, will be scanned into chart. No changes noted.  

## 2016-02-11 NOTE — OR Nursing (Signed)
Dr. Rudene Christians unable to obtain specimen for T9 bone biopsy. However, a specimen for L2 collected.

## 2016-02-11 NOTE — Anesthesia Preprocedure Evaluation (Signed)
Anesthesia Evaluation  Patient identified by MRN, date of birth, ID band Patient awake    Reviewed: Allergy & Precautions, NPO status , Patient's Chart, lab work & pertinent test results  History of Anesthesia Complications Negative for: history of anesthetic complications  Airway Mallampati: II  TM Distance: >3 FB Neck ROM: Full    Dental  (+) Poor Dentition, Missing   Pulmonary neg pulmonary ROS, neg sleep apnea, neg COPD,    breath sounds clear to auscultation- rhonchi (-) wheezing      Cardiovascular hypertension, Pt. on medications (-) CAD and (-) Past MI  Rhythm:Regular Rate:Normal - Systolic murmurs and - Diastolic murmurs    Neuro/Psych PSYCHIATRIC DISORDERS Anxiety Depression parkinsons disease     GI/Hepatic negative GI ROS, Neg liver ROS,   Endo/Other  diabetes (diet controlled)  Renal/GU negative Renal ROS     Musculoskeletal negative musculoskeletal ROS (+)   Abdominal (+) - obese,   Peds  Hematology  (+) anemia ,   Anesthesia Other Findings Past Medical History: No date: Anemia No date: Anxiety No date: Colonic mass No date: Dry cough No date: Hyperlipemia No date: Hypertension No date: Palpitations No date: Parkinson's disease (HCC) No date: Tremors of nervous system   Reproductive/Obstetrics                             Anesthesia Physical Anesthesia Plan  ASA: III  Anesthesia Plan: General   Post-op Pain Management:    Induction: Intravenous  Airway Management Planned: Natural Airway  Additional Equipment:   Intra-op Plan:   Post-operative Plan:   Informed Consent: I have reviewed the patients History and Physical, chart, labs and discussed the procedure including the risks, benefits and alternatives for the proposed anesthesia with the patient or authorized representative who has indicated his/her understanding and acceptance.   Dental advisory  given  Plan Discussed with: CRNA and Anesthesiologist  Anesthesia Plan Comments:         Lab Results  Component Value Date   WBC 11.0 (H) 01/31/2016   HGB 10.1 (L) 01/31/2016   HCT 29.9 (L) 01/31/2016   MCV 88.9 01/31/2016   PLT 461 (H) 01/31/2016    Anesthesia Quick Evaluation

## 2016-02-11 NOTE — Op Note (Signed)
02/11/2016  5:02 PM  PATIENT:  Charles Stanton  61 y.o. male  PRE-OPERATIVE DIAGNOSIS:  Closed wedge compression fracture T9 and L2  POST-OPERATIVE DIAGNOSIS:  Closed wedge compression fracture T9 and L2  PROCEDURE:  Procedure(s): KYPHOPLASTY  L2,T9 (N/A)  SURGEON: Laurene Footman, MD  ASSISTANTS: None  ANESTHESIA:   local and MAC  EBL:  Total I/O In: 400 [I.V.:400] Out: 10 [Blood:10]  BLOOD ADMINISTERED:none  DRAINS: none   LOCAL MEDICATIONS USED:  MARCAINE    and LIDOCAINE   SPECIMEN:  Source of Specimen:  L2 vertebral body  DISPOSITION OF SPECIMEN:  PATHOLOGY  COUNTS:  YES  TOURNIQUET:  * No tourniquets in log *  IMPLANTS: Bone cement  DICTATION: .Dragon Dictation patient brought the operating room and after adequate sedation was given, patient was placed prone. After good C-arm visualization of both levels was obtained appropriate patient education timeout procedure completed. 10 cc of 1% local was infiltrated subcutaneously for initial local anesthetic. The back was then prepped and draped in sterile fashion and repeat timeout procedure carried out. Next the L2 was approached first with a by pedicular approach getting local down to the pedicle injecting a mixture of half percent Sensorcaine and 1% Xylocaine with epinephrine and then making a small incision advancing the trocar and extra pedicular fashion into the vertebral body make sure that the spinal canal and neuroforamen were not entered with the trocar on both AP and lateral imaging. Biopsy was obtained drilling carried out then balloon inflation the balloons were left in place as we went up to T9 on the right side a small incision was made after get for skin the local as previously noted Pedicular fashion biopsy was not obtainedand came out drilling was carried out and then balloon inflation to 2 cc in the midline so only one sided stick was required. Cement was mixed and then did fill and T9 was obtained after this the  trocar was left in place and going down to L2 by pedicular fills carried out without extravasation filling in the interbody left) and the center. Trochars removed and permanent C-arm views obtained. Wounds closed with Dermabond followed by Band-Aids  PLAN OF CARE: Discharge to home after PACU  PATIENT DISPOSITION:  PACU - hemodynamically stable.

## 2016-02-11 NOTE — Discharge Instructions (Addendum)
Take it easy today and resume normal activities tomorrow. Remove Band-Aid's on Saturday and then okay to shower. Call for any problems. Follow-up in 2 weeks for wound check   AMBULATORY SURGERY  DISCHARGE INSTRUCTIONS  1) The drugs that you were given will stay in your system until tomorrow so for the next 24 hours you should not:  A) Drive an automobile B) Make any legal decisions C) Drink any alcoholic beverage  2) You may resume regular meals tomorrow.  Today it is better to start with liquids and gradually work up to solid foods.  You may eat anything you prefer, but it is better to start with liquids, then soup and crackers, and gradually work up to solid foods.  3) Please notify your doctor immediately if you have any unusual bleeding, trouble breathing, redness and pain at the surgery site, drainage, fever, or pain not relieved by medication.  4) Additional Instructions:   Please contact your physician with any problems or Same Day Surgery at [phone], Monday through Friday 6 am to 4 pm, or [place] at [facility] number at [phone].

## 2016-02-11 NOTE — Transfer of Care (Signed)
Immediate Anesthesia Transfer of Care Note  Patient: Charles Stanton  Procedure(s) Performed: Procedure(s): KYPHOPLASTY  L2,T9 (N/A)  Patient Location: PACU  Anesthesia Type:General  Level of Consciousness: awake, alert  and oriented  Airway & Oxygen Therapy: Patient Spontanous Breathing and Patient connected to nasal cannula oxygen  Post-op Assessment: Report given to RN and Post -op Vital signs reviewed and stable  Post vital signs: Reviewed and stable  Last Vitals:  Vitals:   02/11/16 1452 02/11/16 1656  BP: (!) 144/91 (!) 136/92  Pulse: 83 88  Resp: 16 17  Temp: 36.6 C 36.2 C    Last Pain:  Vitals:   02/11/16 1656  TempSrc: Temporal  PainSc: 0-No pain         Complications: No apparent anesthesia complications

## 2016-02-12 ENCOUNTER — Encounter: Payer: Self-pay | Admitting: Orthopedic Surgery

## 2016-02-15 LAB — SURGICAL PATHOLOGY

## 2016-03-09 ENCOUNTER — Encounter: Payer: Self-pay | Admitting: *Deleted

## 2016-03-09 ENCOUNTER — Other Ambulatory Visit: Payer: Self-pay | Admitting: Orthopedic Surgery

## 2016-03-09 DIAGNOSIS — S32030A Wedge compression fracture of third lumbar vertebra, initial encounter for closed fracture: Secondary | ICD-10-CM

## 2016-03-11 ENCOUNTER — Encounter
Admission: RE | Admit: 2016-03-11 | Discharge: 2016-03-11 | Disposition: A | Discharge status: Home / Self Care | Payer: Federal, State, Local not specified - PPO | Source: Ambulatory Visit | Attending: Orthopedic Surgery | Admitting: Orthopedic Surgery

## 2016-03-11 DIAGNOSIS — Z01812 Encounter for preprocedural laboratory examination: Secondary | ICD-10-CM | POA: Diagnosis not present

## 2016-03-11 HISTORY — DX: Major depressive disorder, single episode, unspecified: F32.9

## 2016-03-11 HISTORY — DX: Depression, unspecified: F32.A

## 2016-03-11 LAB — BASIC METABOLIC PANEL
ANION GAP: 8 (ref 5–15)
BUN: 6 mg/dL (ref 6–20)
CO2: 27 mmol/L (ref 22–32)
Calcium: 8.8 mg/dL — ABNORMAL LOW (ref 8.9–10.3)
Chloride: 104 mmol/L (ref 101–111)
Creatinine, Ser: 0.52 mg/dL — ABNORMAL LOW (ref 0.61–1.24)
GFR calc Af Amer: >60 mL/min (ref >60)
Glucose, Bld: 153 mg/dL — ABNORMAL HIGH (ref 65–99)
POTASSIUM: 3.3 mmol/L — AB (ref 3.5–5.1)
SODIUM: 139 mmol/L (ref 135–145)

## 2016-03-11 LAB — CBC
HCT: 44.4 % (ref 40.0–52.0)
Hemoglobin: 14.8 g/dL (ref 13.0–18.0)
MCH: 29.4 pg (ref 26.0–34.0)
MCHC: 33.4 g/dL (ref 32.0–36.0)
MCV: 88.1 fL (ref 80.0–100.0)
PLATELETS: 268 10*3/uL (ref 150–440)
RBC: 5.04 MIL/uL (ref 4.40–5.90)
RDW: 18.2 % — ABNORMAL HIGH (ref 11.5–14.5)
WBC: 7.9 10*3/uL (ref 3.8–10.6)

## 2016-03-11 NOTE — Patient Instructions (Signed)
Your procedure is scheduled on: March 15, 2016  [facility]) Report to Same Day Surgery 2nd floor medical mall ([place] Entrance-take elevator on left to 2nd floor.  Check in with surgery information desk.) To find out your arrival time please call [phone] between 1PM - 3PM on March 14, 2016 (MONDAY)  Remember: Instructions that are not followed completely may result in serious medical risk, up to and including death, or upon the discretion of your surgeon and anesthesiologist your surgery may need to be rescheduled.    _x___ 1. Do not eat food or drink liquids after midnight. No gum chewing or hard candies.                              __x__ 2. No Alcohol for 24 hours before or after surgery.   __x__3. No Smoking for 24 prior to surgery.   ____  4. Bring all medications with you on the day of surgery if instructed.    __x__ 5. Notify your doctor if there is any change in your medical condition     (cold, fever, infections).     Do not wear jewelry, make-up, hairpins, clips or nail polish.  Do not wear lotions, powders, or perfumes. You may wear deodorant.  Do not shave 48 hours prior to surgery. Men may shave face and neck.  Do not bring valuables to the hospital.    [facility] is not responsible for any belongings or valuables.               Contacts, dentures or bridgework may not be worn into surgery.  Leave your suitcase in the car. After surgery it may be brought to your room.  For patients admitted to the hospital, discharge time is determined by your                       treatment team.   Patients discharged the day of surgery will not be allowed to drive home.  You will need someone to drive you home and stay with you the night of your procedure.    Please read over the following fact sheets that you were given:   [facility] Preparing for Surgery and or MRSA Information   _x___ Take anti-hypertensive (unless it includes a diuretic), cardiac, seizure, asthma,      anti-reflux and psychiatric medicines. These include:  1. SINEMET  2. LISINOPRIL  3. ZOLOFT  4.  5.  6.  ____Fleets enema or Magnesium Citrate as directed.   _x___ Use CHG Soap or sage wipes as directed on instruction sheet   ____ Use inhalers on the day of surgery and bring to hospital day of surgery  ____ Stop Metformin and Janumet 2 days prior to surgery.    ____ Take 1/2 of usual insulin dose the night before surgery and none on the morning     surgery.   _x___ Follow recommendations from Cardiologist, Pulmonologist or PCP regarding          stopping Aspirin, Coumadin, Pllavix ,Eliquis, Effient, or Pradaxa, and Pletal.  X____Stop Anti-inflammatories such as Advil, Aleve, Ibuprofen, Motrin, Naproxen, Naprosyn, Goodies powders or aspirin products. OK to take Tylenol                             _x___ Stop supplements until after surgery.  But may continue Vitamin D,  Vitamin B,  and multivitamin       ____ Bring C-Pap to the hospital.

## 2016-03-11 NOTE — Pre-Procedure Instructions (Signed)
Potassium results faxed and called to Dr. Rudene Christians office (spoke to [place]).

## 2016-03-12 NOTE — Pre-Procedure Instructions (Signed)
Positive MRSA & staph aureus results faxed to Dr. Rudene Christians.   Asked if wanted any treatment &/or antibiotics added?

## 2016-03-14 LAB — SURGICAL PCR SCREEN
MRSA, PCR: POSITIVE — AB
Staphylococcus aureus: POSITIVE — AB

## 2016-03-14 MED ORDER — CEFAZOLIN SODIUM-DEXTROSE 2-4 GM/100ML-% IV SOLN
2.0000 g | Freq: Once | INTRAVENOUS | Status: AC
  Administered 2016-03-15: 2 g via INTRAVENOUS

## 2016-03-15 ENCOUNTER — Ambulatory Visit
Admission: RE | Admit: 2016-03-15 | Discharge: 2016-03-15 | Disposition: A | Discharge status: Home / Self Care | Payer: Federal, State, Local not specified - PPO | Source: Ambulatory Visit | Attending: Orthopedic Surgery | Admitting: Orthopedic Surgery

## 2016-03-15 ENCOUNTER — Ambulatory Visit: Payer: Federal, State, Local not specified - PPO

## 2016-03-15 ENCOUNTER — Encounter
Admission: RE | Disposition: A | Discharge status: Home / Self Care | Payer: Self-pay | Source: Ambulatory Visit | Attending: Orthopedic Surgery

## 2016-03-15 ENCOUNTER — Encounter: Payer: Self-pay | Admitting: *Deleted

## 2016-03-15 ENCOUNTER — Ambulatory Visit: Payer: Federal, State, Local not specified - PPO | Admitting: Anesthesiology

## 2016-03-15 DIAGNOSIS — Z833 Family history of diabetes mellitus: Secondary | ICD-10-CM | POA: Diagnosis not present

## 2016-03-15 DIAGNOSIS — I10 Essential (primary) hypertension: Secondary | ICD-10-CM | POA: Insufficient documentation

## 2016-03-15 DIAGNOSIS — Z818 Family history of other mental and behavioral disorders: Secondary | ICD-10-CM | POA: Diagnosis not present

## 2016-03-15 DIAGNOSIS — Z8249 Family history of ischemic heart disease and other diseases of the circulatory system: Secondary | ICD-10-CM | POA: Diagnosis not present

## 2016-03-15 DIAGNOSIS — S32030A Wedge compression fracture of third lumbar vertebra, initial encounter for closed fracture: Secondary | ICD-10-CM | POA: Insufficient documentation

## 2016-03-15 DIAGNOSIS — F419 Anxiety disorder, unspecified: Secondary | ICD-10-CM | POA: Diagnosis not present

## 2016-03-15 DIAGNOSIS — E785 Hyperlipidemia, unspecified: Secondary | ICD-10-CM | POA: Insufficient documentation

## 2016-03-15 DIAGNOSIS — W19XXXA Unspecified fall, initial encounter: Secondary | ICD-10-CM | POA: Diagnosis not present

## 2016-03-15 DIAGNOSIS — Z883 Allergy status to other anti-infective agents status: Secondary | ICD-10-CM | POA: Insufficient documentation

## 2016-03-15 DIAGNOSIS — Z803 Family history of malignant neoplasm of breast: Secondary | ICD-10-CM | POA: Insufficient documentation

## 2016-03-15 DIAGNOSIS — M479 Spondylosis, unspecified: Secondary | ICD-10-CM | POA: Diagnosis not present

## 2016-03-15 DIAGNOSIS — Z86010 Personal history of colonic polyps: Secondary | ICD-10-CM | POA: Diagnosis not present

## 2016-03-15 DIAGNOSIS — Z9889 Other specified postprocedural states: Secondary | ICD-10-CM | POA: Diagnosis not present

## 2016-03-15 DIAGNOSIS — G20 Parkinson's disease: Secondary | ICD-10-CM | POA: Diagnosis not present

## 2016-03-15 DIAGNOSIS — Z79899 Other long term (current) drug therapy: Secondary | ICD-10-CM | POA: Insufficient documentation

## 2016-03-15 DIAGNOSIS — Z79891 Long term (current) use of opiate analgesic: Secondary | ICD-10-CM | POA: Insufficient documentation

## 2016-03-15 DIAGNOSIS — Z419 Encounter for procedure for purposes other than remedying health state, unspecified: Secondary | ICD-10-CM

## 2016-03-15 DIAGNOSIS — Z800 Family history of malignant neoplasm of digestive organs: Secondary | ICD-10-CM | POA: Diagnosis not present

## 2016-03-15 DIAGNOSIS — Z791 Long term (current) use of non-steroidal anti-inflammatories (NSAID): Secondary | ICD-10-CM | POA: Insufficient documentation

## 2016-03-15 HISTORY — PX: KYPHOPLASTY: SHX5884

## 2016-03-15 LAB — POCT I-STAT 4, (NA,K, GLUC, HGB,HCT)
Glucose, Bld: 125 mg/dL — ABNORMAL HIGH (ref 65–99)
HEMATOCRIT: 54 % — AB (ref 39.0–52.0)
Hemoglobin: 18.4 g/dL — ABNORMAL HIGH (ref 13.0–17.0)
Potassium: 4.8 mmol/L (ref 3.5–5.1)
Sodium: 138 mmol/L (ref 135–145)

## 2016-03-15 SURGERY — KYPHOPLASTY
Anesthesia: General | Site: Spine Lumbar | Wound class: Clean

## 2016-03-15 MED ORDER — SODIUM CHLORIDE 0.9 % IV SOLN: INTRAVENOUS | Status: DC

## 2016-03-15 MED ORDER — LIDOCAINE HCL (PF) 1 % IJ SOLN
INTRAMUSCULAR | Status: AC
  Filled 2016-03-15: qty 30

## 2016-03-15 MED ORDER — FAMOTIDINE 20 MG PO TABS
20.0000 mg | ORAL_TABLET | Freq: Once | ORAL | Status: AC
  Administered 2016-03-15: 20 mg via ORAL

## 2016-03-15 MED ORDER — METOCLOPRAMIDE HCL 5 MG/ML IJ SOLN: 5.0000 mg | Freq: Three times a day (TID) | INTRAMUSCULAR | Status: DC | PRN

## 2016-03-15 MED ORDER — HYDROCODONE-ACETAMINOPHEN 5-325 MG PO TABS: 1.0000 | ORAL_TABLET | ORAL | Status: DC | PRN

## 2016-03-15 MED ORDER — FENTANYL CITRATE (PF) 100 MCG/2ML IJ SOLN
INTRAMUSCULAR | Status: DC | PRN
  Administered 2016-03-15 (×2): 25 ug via INTRAVENOUS
  Administered 2016-03-15: 50 ug via INTRAVENOUS

## 2016-03-15 MED ORDER — METOCLOPRAMIDE HCL 10 MG PO TABS: 5.0000 mg | ORAL_TABLET | Freq: Three times a day (TID) | ORAL | Status: DC | PRN

## 2016-03-15 MED ORDER — FENTANYL CITRATE (PF) 100 MCG/2ML IJ SOLN: 25.0000 ug | INTRAMUSCULAR | Status: DC | PRN

## 2016-03-15 MED ORDER — HYDROCODONE-ACETAMINOPHEN 5-325 MG PO TABS: 1.0000 | ORAL_TABLET | ORAL | 0 refills | Status: DC | PRN

## 2016-03-15 MED ORDER — PROPOFOL 10 MG/ML IV BOLUS
INTRAVENOUS | Status: DC | PRN
  Administered 2016-03-15 (×2): 20 mg via INTRAVENOUS

## 2016-03-15 MED ORDER — BUPIVACAINE-EPINEPHRINE (PF) 0.5% -1:200000 IJ SOLN
INTRAMUSCULAR | Status: AC
  Filled 2016-03-15: qty 30

## 2016-03-15 MED ORDER — SODIUM CHLORIDE 0.9 % IV SOLN
INTRAVENOUS | Status: DC
  Administered 2016-03-15: 12:00:00 via INTRAVENOUS

## 2016-03-15 MED ORDER — ONDANSETRON HCL 4 MG/2ML IJ SOLN: 4.0000 mg | Freq: Once | INTRAMUSCULAR | Status: DC | PRN

## 2016-03-15 MED ORDER — PROPOFOL 10 MG/ML IV BOLUS
INTRAVENOUS | Status: AC
  Filled 2016-03-15: qty 20

## 2016-03-15 MED ORDER — PHENYLEPHRINE HCL 10 MG/ML IJ SOLN
INTRAMUSCULAR | Status: AC
  Filled 2016-03-15: qty 1

## 2016-03-15 MED ORDER — LIDOCAINE HCL (CARDIAC) 20 MG/ML IV SOLN
INTRAVENOUS | Status: DC | PRN
  Administered 2016-03-15: 60 mg via INTRAVENOUS

## 2016-03-15 MED ORDER — MIDAZOLAM HCL 2 MG/2ML IJ SOLN
INTRAMUSCULAR | Status: AC
  Filled 2016-03-15: qty 2

## 2016-03-15 MED ORDER — EPHEDRINE SULFATE 50 MG/ML IJ SOLN
INTRAMUSCULAR | Status: AC
  Filled 2016-03-15: qty 1

## 2016-03-15 MED ORDER — ONDANSETRON HCL 4 MG/2ML IJ SOLN: 4.0000 mg | Freq: Four times a day (QID) | INTRAMUSCULAR | Status: DC | PRN

## 2016-03-15 MED ORDER — ONDANSETRON HCL 4 MG PO TABS
4.0000 mg | ORAL_TABLET | Freq: Four times a day (QID) | ORAL | Status: DC | PRN
Start: 2016-03-15 — End: 2016-03-15

## 2016-03-15 MED ORDER — LIDOCAINE HCL (PF) 2 % IJ SOLN
INTRAMUSCULAR | Status: AC
  Filled 2016-03-15: qty 2

## 2016-03-15 MED ORDER — MIDAZOLAM HCL 2 MG/2ML IJ SOLN
INTRAMUSCULAR | Status: DC | PRN
Start: 2016-03-15 — End: 2016-03-15
  Administered 2016-03-15: 2 mg via INTRAVENOUS

## 2016-03-15 MED ORDER — PROPOFOL 500 MG/50ML IV EMUL
INTRAVENOUS | Status: DC | PRN
  Administered 2016-03-15: 75 ug/kg/min via INTRAVENOUS

## 2016-03-15 MED ORDER — FENTANYL CITRATE (PF) 100 MCG/2ML IJ SOLN
INTRAMUSCULAR | Status: AC
  Filled 2016-03-15: qty 2

## 2016-03-15 MED ORDER — FAMOTIDINE 20 MG PO TABS
ORAL_TABLET | ORAL | Status: AC
  Filled 2016-03-15: qty 1

## 2016-03-15 MED ORDER — BUPIVACAINE-EPINEPHRINE (PF) 0.5% -1:200000 IJ SOLN
INTRAMUSCULAR | Status: DC | PRN
  Administered 2016-03-15: 10 mL

## 2016-03-15 MED ORDER — LIDOCAINE HCL 1 % IJ SOLN
INTRAMUSCULAR | Status: DC | PRN
  Administered 2016-03-15: 10 mL

## 2016-03-15 MED ORDER — SODIUM CHLORIDE 0.9 % IJ SOLN
INTRAMUSCULAR | Status: AC
  Filled 2016-03-15: qty 10

## 2016-03-15 MED ORDER — CEFAZOLIN SODIUM-DEXTROSE 2-4 GM/100ML-% IV SOLN
INTRAVENOUS | Status: AC
  Filled 2016-03-15: qty 100

## 2016-03-15 MED ORDER — PROPOFOL 500 MG/50ML IV EMUL
INTRAVENOUS | Status: AC
  Filled 2016-03-15: qty 50

## 2016-03-15 MED ORDER — IOPAMIDOL (ISOVUE-M 200) INJECTION 41%
INTRAMUSCULAR | Status: AC
  Filled 2016-03-15: qty 20

## 2016-03-15 SURGICAL SUPPLY — 15 items
CEMENT BONE KYPHON CDS (Cement) ×3 IMPLANT
DEVICE BIOPSY BONE KYPHX (INSTRUMENTS) ×3 IMPLANT
DRAPE C-ARM XRAY 36X54 (DRAPES) ×3 IMPLANT
DURAPREP 26ML APPLICATOR (WOUND CARE) ×3 IMPLANT
GLOVE SURG SYN 9.0  PF PI (GLOVE) ×2
GLOVE SURG SYN 9.0 PF PI (GLOVE) ×1 IMPLANT
GOWN SRG 2XL LVL 4 RGLN SLV (GOWNS) ×1 IMPLANT
GOWN STRL NON-REIN 2XL LVL4 (GOWNS) ×2
GOWN STRL REUS W/ TWL LRG LVL3 (GOWN DISPOSABLE) ×1 IMPLANT
GOWN STRL REUS W/TWL LRG LVL3 (GOWN DISPOSABLE) ×2
LIQUID BAND (GAUZE/BANDAGES/DRESSINGS) ×3 IMPLANT
PACK KYPHOPLASTY (MISCELLANEOUS) ×3 IMPLANT
STRAP SAFETY BODY (MISCELLANEOUS) ×3 IMPLANT
TRAY KYPHOPAK 15/3 EXPRESS 1ST (MISCELLANEOUS) IMPLANT
TRAY KYPHOPAK 20/3 EXPRESS 1ST (MISCELLANEOUS) ×3 IMPLANT

## 2016-03-15 NOTE — Op Note (Signed)
03/15/2016  1:19 PM  PATIENT:  Charles Stanton  61 y.o. male  PRE-OPERATIVE DIAGNOSIS:  closed compression fracture of third lumbar vertebrae  POST-OPERATIVE DIAGNOSIS:  closed compression fracture of third lumbar vertebrae  PROCEDURE:  Procedure(s): KYPHOPLASTY L3 (N/A)  SURGEON: Laurene Footman, MD  ASSISTANTS: None  ANESTHESIA:   local and MAC  EBL:  Total I/O In: 400 [I.V.:400] Out: 5 [Blood:5]  BLOOD ADMINISTERED:none  DRAINS: none   LOCAL MEDICATIONS USED:  MARCAINE    and XYLOCAINE   SPECIMEN:  Source of Specimen:  L3 vertebral body  DISPOSITION OF SPECIMEN:  PATHOLOGY  COUNTS:  YES  TOURNIQUET:  * No tourniquets in log *  IMPLANTS: Bone cement  DICTATION: .Dragon Dictation patient was brought to the operating room and after adequate sedation was given, patient was placed prone. C-arm was brought in and both AP and lateral projections and good visualization of L3 was obtained. Recent identification and timeout procedure were completed the skin prepped with alcohol and 5 cc 1% Xylocaine infiltrated subcutaneously on both sides. After allowing this to set him prepping and draping the patient and C-arm repeat timeout procedures carried out. Spinal needle was used on the right side to get down to the pedicle on the right and trocar entered into the vertebral body staying lateral to the medial wall the pedicle. Biopsy was obtained. Drilling was carried out and a balloon inflated to the midline to have cc. Cement was mixed and was appropriate consistency was injected with some extravasation into the L3-4 disc space. There is good fill in the central and right side of L3 is deemed adequate with some cement going along the superior endplate compression fracture line consistent with preoperative MRI and x-Zinni. After the cemented set trochars removed and permanent C-arm views obtained. The wound was closed with Dermabond followed by a Band-Aid  PLAN OF CARE: Discharge to home after  PACU  PATIENT DISPOSITION:  PACU - hemodynamically stable.

## 2016-03-15 NOTE — Discharge Instructions (Addendum)
Family as tolerated. Remove Band-Aid on Thursday okay to shower after that pain medicine as directed  AMBULATORY SURGERY  DISCHARGE INSTRUCTIONS   1) The drugs that you were given will stay in your system until tomorrow so for the next 24 hours you should not:  A) Drive an automobile B) Make any legal decisions C) Drink any alcoholic beverage   2) You may resume regular meals tomorrow.  Today it is better to start with liquids and gradually work up to solid foods.  You may eat anything you prefer, but it is better to start with liquids, then soup and crackers, and gradually work up to solid foods.   3) Please notify your doctor immediately if you have any unusual bleeding, trouble breathing, redness and pain at the surgery site, drainage, fever, or pain not relieved by medication.    4) Additional Instructions:        Please contact your physician with any problems or Same Day Surgery at [phone], Monday through Friday 6 am to 4 pm, or [place] at [facility] number at [phone].

## 2016-03-15 NOTE — Transfer of Care (Signed)
Immediate Anesthesia Transfer of Care Note  Patient: Charles Stanton  Procedure(s) Performed: Procedure(s): KYPHOPLASTY L3 (N/A)  Patient Location: PACU  Anesthesia Type:General  Level of Consciousness: awake and patient cooperative  Airway & Oxygen Therapy: Patient Spontanous Breathing and Patient connected to face mask oxygen  Post-op Assessment: Report given to RN and Post -op Vital signs reviewed and stable  Post vital signs: Reviewed and stable  Last Vitals:  Vitals:   03/15/16 1108 03/15/16 1308  BP: (!) 159/106 (!) 145/104  Pulse: (!) 54 79  Resp: 18 (!) 23  Temp: 36.3 C 36.9 C    Last Pain:  Vitals:   03/15/16 1308  TempSrc:   PainSc: 0-No pain         Complications: No apparent anesthesia complications

## 2016-03-15 NOTE — Anesthesia Post-op Follow-up Note (Cosign Needed)
Anesthesia QCDR form completed.        

## 2016-03-15 NOTE — Anesthesia Postprocedure Evaluation (Signed)
Anesthesia Post Note  Patient: Charles Stanton  Procedure(s) Performed: Procedure(s) (LRB): KYPHOPLASTY L3 (N/A)  Patient location during evaluation: PACU Anesthesia Type: General Level of consciousness: awake and alert and oriented Pain management: pain level controlled Vital Signs Assessment: post-procedure vital signs reviewed and stable Respiratory status: spontaneous breathing Cardiovascular status: blood pressure returned to baseline Anesthetic complications: no     Last Vitals:  Vitals:   03/15/16 1337 03/15/16 1352  BP: (!) 143/91 (!) 148/86  Pulse: 76 78  Resp: (!) 26 19  Temp:  36.7 C    Last Pain:  Vitals:   03/15/16 1352  TempSrc:   PainSc: 2                  Narcissa Melder

## 2016-03-15 NOTE — Anesthesia Preprocedure Evaluation (Signed)
Anesthesia Evaluation  Patient identified by MRN, date of birth, ID band Patient awake    Reviewed: Allergy & Precautions, NPO status , Patient's Chart, lab work & pertinent test results  History of Anesthesia Complications Negative for: history of anesthetic complications  Airway Mallampati: II  TM Distance: >3 FB Neck ROM: Full    Dental  (+) Poor Dentition, Missing   Pulmonary neg pulmonary ROS, neg sleep apnea, neg COPD,    breath sounds clear to auscultation- rhonchi (-) wheezing      Cardiovascular hypertension, Pt. on medications + Peripheral Vascular Disease  (-) CAD and (-) Past MI  Rhythm:Regular Rate:Normal - Systolic murmurs and - Diastolic murmurs    Neuro/Psych PSYCHIATRIC DISORDERS Anxiety Depression parkinsons disease     GI/Hepatic negative GI ROS, Abnormal liver function test   Endo/Other  diabetes, Well Controlled, Type 2  Renal/GU negative Renal ROS     Musculoskeletal negative musculoskeletal ROS (+)   Abdominal (+) - obese,   Peds  Hematology  (+) anemia ,   Anesthesia Other Findings Past Medical History: No date: Anemia No date: Anxiety No date: Colonic mass No date: Dry cough No date: Hyperlipemia No date: Hypertension No date: Palpitations No date: Parkinson's disease (HCC) No date: Tremors of nervous system   Reproductive/Obstetrics                             Anesthesia Physical  Anesthesia Plan  ASA: III  Anesthesia Plan: General   Post-op Pain Management:    Induction: Intravenous  Airway Management Planned: Natural Airway  Additional Equipment:   Intra-op Plan:   Post-operative Plan:   Informed Consent: I have reviewed the patients History and Physical, chart, labs and discussed the procedure including the risks, benefits and alternatives for the proposed anesthesia with the patient or authorized representative who has indicated  his/her understanding and acceptance.   Dental advisory given  Plan Discussed with: CRNA and Anesthesiologist  Anesthesia Plan Comments:         Lab Results  Component Value Date   WBC 7.9 03/11/2016   HGB 18.4 (H) 03/15/2016   HCT 54.0 (H) 03/15/2016   MCV 88.1 03/11/2016   PLT 268 03/11/2016    Anesthesia Quick Evaluation

## 2016-03-15 NOTE — H&P (Signed)
Reviewed paper H+P, will be scanned into chart. Patient examined No changes noted.  

## 2016-03-16 ENCOUNTER — Encounter: Payer: Self-pay | Admitting: Orthopedic Surgery

## 2016-03-16 LAB — SURGICAL PATHOLOGY

## 2016-03-18 ENCOUNTER — Encounter: Payer: Self-pay | Admitting: *Deleted

## 2016-03-18 DIAGNOSIS — Z9889 Other specified postprocedural states: Secondary | ICD-10-CM | POA: Insufficient documentation

## 2016-03-18 DIAGNOSIS — E538 Deficiency of other specified B group vitamins: Secondary | ICD-10-CM | POA: Insufficient documentation

## 2016-03-21 ENCOUNTER — Ambulatory Visit: Payer: Federal, State, Local not specified - PPO | Admitting: Anesthesiology

## 2016-03-21 ENCOUNTER — Encounter: Payer: Self-pay | Admitting: Anesthesiology

## 2016-03-21 ENCOUNTER — Encounter
Admission: RE | Disposition: A | Discharge status: Home / Self Care | Payer: Self-pay | Source: Ambulatory Visit | Attending: Gastroenterology

## 2016-03-21 ENCOUNTER — Ambulatory Visit
Admission: RE | Admit: 2016-03-21 | Discharge: 2016-03-21 | Disposition: A | Discharge status: Home / Self Care | Payer: Federal, State, Local not specified - PPO | Source: Ambulatory Visit | Attending: Gastroenterology | Admitting: Gastroenterology

## 2016-03-21 DIAGNOSIS — Z538 Procedure and treatment not carried out for other reasons: Secondary | ICD-10-CM | POA: Diagnosis not present

## 2016-03-21 DIAGNOSIS — I10 Essential (primary) hypertension: Secondary | ICD-10-CM | POA: Insufficient documentation

## 2016-03-21 DIAGNOSIS — E1151 Type 2 diabetes mellitus with diabetic peripheral angiopathy without gangrene: Secondary | ICD-10-CM | POA: Insufficient documentation

## 2016-03-21 DIAGNOSIS — Z79899 Other long term (current) drug therapy: Secondary | ICD-10-CM | POA: Insufficient documentation

## 2016-03-21 DIAGNOSIS — Z9889 Other specified postprocedural states: Secondary | ICD-10-CM | POA: Insufficient documentation

## 2016-03-21 HISTORY — DX: Abnormal results of liver function studies: R94.5

## 2016-03-21 HISTORY — DX: Personal history of colon polyps, unspecified: Z86.0100

## 2016-03-21 HISTORY — DX: Pure hypercholesterolemia, unspecified: E78.00

## 2016-03-21 HISTORY — DX: Peripheral vascular disease, unspecified: I73.9

## 2016-03-21 HISTORY — DX: Personal history of colonic polyps: Z86.010

## 2016-03-21 HISTORY — DX: Dizziness and giddiness: R42

## 2016-03-21 SURGERY — COLONOSCOPY WITH PROPOFOL
Anesthesia: General

## 2016-03-21 MED ORDER — FENTANYL CITRATE (PF) 100 MCG/2ML IJ SOLN
INTRAMUSCULAR | Status: AC
  Filled 2016-03-21: qty 2

## 2016-03-21 MED ORDER — SODIUM CHLORIDE 0.9 % IV SOLN: INTRAVENOUS | Status: DC

## 2016-03-21 MED ORDER — PROPOFOL 500 MG/50ML IV EMUL
INTRAVENOUS | Status: AC
  Filled 2016-03-21: qty 50

## 2016-03-21 MED ORDER — MIDAZOLAM HCL 2 MG/2ML IJ SOLN
INTRAMUSCULAR | Status: AC
  Filled 2016-03-21: qty 2

## 2016-03-21 NOTE — Anesthesia Preprocedure Evaluation (Deleted)
Anesthesia Evaluation  Patient identified by MRN, date of birth, ID band Patient awake    Reviewed: Allergy & Precautions, NPO status , Patient's Chart, lab work & pertinent test results, reviewed documented beta blocker date and time   Airway Mallampati: II  TM Distance: >3 FB     Dental  (+) Chipped   Pulmonary           Cardiovascular hypertension, Pt. on medications + Peripheral Vascular Disease       Neuro/Psych    GI/Hepatic   Endo/Other  diabetes, Type 2  Renal/GU      Musculoskeletal   Abdominal   Peds  Hematology  (+) anemia ,   Anesthesia Other Findings Parkinson's.  Reproductive/Obstetrics                             Anesthesia Physical Anesthesia Plan  ASA: III  Anesthesia Plan: General   Post-op Pain Management:    Induction: Intravenous  Airway Management Planned: Nasal Cannula  Additional Equipment:   Intra-op Plan:   Post-operative Plan:   Informed Consent: I have reviewed the patients History and Physical, chart, labs and discussed the procedure including the risks, benefits and alternatives for the proposed anesthesia with the patient or authorized representative who has indicated his/her understanding and acceptance.     Plan Discussed with: CRNA  Anesthesia Plan Comments:         Anesthesia Quick Evaluation

## 2016-03-21 NOTE — H&P (Signed)
Patient presented today for a colonoscopy. However on further discussion with the patient and he just had a kyphoplasty procedure about 6 days ago. Indeed he was still having a lot of discomfort. Discussed this thoroughly with the patient and we elected to hold off on this procedure for another 2 weeks to allow him a little bit more healing time. He was concerned of having to move him multiple times on a table during a colonoscopy while he was sedated. We'll arrange for that.

## 2016-03-21 NOTE — OR Nursing (Signed)
Pt stated back surgery last week 3/13. Dr. Gustavo Lah notified. After consultation with Dr. Rudene Christians, decision was made to cancel today's colonoscopy and reschedule. Dr. Marton Redwood office in to reschedule.

## 2016-03-30 ENCOUNTER — Ambulatory Visit
Admission: RE | Admit: 2016-03-30 | Discharge: 2016-03-30 | Disposition: A | Discharge status: Home / Self Care | Payer: Federal, State, Local not specified - PPO | Source: Ambulatory Visit | Attending: Orthopedic Surgery | Admitting: Orthopedic Surgery

## 2016-03-30 ENCOUNTER — Other Ambulatory Visit: Payer: Self-pay | Admitting: Orthopedic Surgery

## 2016-03-30 DIAGNOSIS — M7989 Other specified soft tissue disorders: Secondary | ICD-10-CM

## 2016-03-30 DIAGNOSIS — R2241 Localized swelling, mass and lump, right lower limb: Secondary | ICD-10-CM | POA: Diagnosis present

## 2016-04-07 ENCOUNTER — Ambulatory Visit
Admission: RE | Admit: 2016-04-07 | Payer: Federal, State, Local not specified - PPO | Source: Ambulatory Visit | Admitting: Gastroenterology

## 2016-04-07 ENCOUNTER — Encounter: Admission: RE | Payer: Self-pay | Source: Ambulatory Visit

## 2016-04-07 ENCOUNTER — Other Ambulatory Visit: Payer: Self-pay | Admitting: Psychiatry

## 2016-04-07 SURGERY — COLONOSCOPY WITH PROPOFOL
Anesthesia: General

## 2016-05-04 ENCOUNTER — Ambulatory Visit: Payer: Federal, State, Local not specified - PPO | Admitting: Psychiatry

## 2016-05-11 ENCOUNTER — Ambulatory Visit (INDEPENDENT_AMBULATORY_CARE_PROVIDER_SITE_OTHER): Payer: Federal, State, Local not specified - PPO | Admitting: Psychiatry

## 2016-05-11 DIAGNOSIS — F39 Unspecified mood [affective] disorder: Secondary | ICD-10-CM | POA: Diagnosis not present

## 2016-05-11 DIAGNOSIS — G20 Parkinson's disease: Secondary | ICD-10-CM | POA: Diagnosis not present

## 2016-05-11 MED ORDER — SERTRALINE HCL 50 MG PO TABS: 50.0000 mg | ORAL_TABLET | Freq: Every day | ORAL | 1 refills | Status: DC

## 2016-05-11 NOTE — Progress Notes (Signed)
Psychiatric MD Progress Note   Patient Identification: Charles Stanton MRN:  [id number] Date of Evaluation:  05/11/2016 Referral Source: [name]  Chief Complaint:    Visit Diagnosis:    ICD-9-CM ICD-10-CM   1. Episodic mood disorder ([place]) 296.90 F39   2. Parkinson disease ([place]) 332.0 G20     History of Present Illness:    Patient is a 61 -year-old male who presented for follow-up. He has history of Parkinson's disease and presented for the follow-up appointment. Patient  appeared  weak and he reported that he Recently had 2 back surgeries after he fell couple of times since January. He brought about a lot of gabapentin as he reported that he was recently started on gabapentin by his neurologist. He reported that he is doing well on the medication. He reported that he has been getting physical therapy at home as well a nurse  is coming at his home to check his blood pressure. He reported that the physical therapy is really helpful and he is enjoying it. He has 5 more weeks. He reported that the physical therapy is helping him with his mobility. He reported that he was feeling anxious this morning as he was coming for this appointment. He feels better at home most of the time. Patient appears calm and alert during the interview.He wants to continue it for a longer period of time. He stated that he has decreased the use of other medications. He does not take trazodone as he has been sleeping well with the help of gabapentin. He denied having any suicidal ideations or plans. He reported that his wife is very supportive.      He appeared pleasant and cooperative during the interview.  Associated Signs/Symptoms: Depression Symptoms:  fatigue, anxiety, (Hypo) Manic Symptoms:  Irritable Mood, Labiality of Mood, Anxiety Symptoms:  Excessive Worry, Psychotic Symptoms:  none PTSD Symptoms: Negative NA  Past Psychiatric History: Patient reported that he has never seen a psychiatrist in the  past. He denied any history of suicide attempts. He denied any history of psychiatric hospitalization.   Previous Psychotropic Medications:  Celexa Trazodone  Substance Abuse History in the last 12 months:  Yes.   Drinking alcohol on a daily basis  Consequences of Substance Abuse: Negative NA  Past Medical History:  Past Medical History:  Diagnosis Date  . Abnormal liver function   . Anemia   . Anxiety   . Colonic mass   . Depression   . Diabetes mellitus without complication ([place])    patient denies  . Dizziness   . Dry cough   . History of colon polyps   . Hypercholesteremia   . Hyperlipemia   . Hypertension   . Palpitations   . Parkinson's disease ([place])   . Parkinson's disease ([place])   . PVD (peripheral vascular disease) ([place])   . Tremors of nervous system     Past Surgical History:  Procedure Laterality Date  . BACK SURGERY     Kyphoplasty   March 2018  . COLONOSCOPY    . HERNIA REPAIR     left inguinal hernia repair as an infant  . KYPHOPLASTY N/A 02/11/2016   Procedure: KYPHOPLASTY  L2,T9;  Surgeon: Hessie Knows, MD;  Location: [facility] ORS;  Service: Orthopedics;  Laterality: N/A;  . KYPHOPLASTY N/A 03/15/2016   Procedure: KYPHOPLASTY L3;  Surgeon: Hessie Knows, MD;  Location: [facility] ORS;  Service: Orthopedics;  Laterality: N/A;  . skin cancer removed    . TONSILLECTOMY  Family Psychiatric History:  Mother- Dementia Father- anxiety   Family History:  Family History  Problem Relation Age of Onset  . Heart Problems Mother   . Dementia Mother     Social History:   Social History   Social History  . Marital status: Married    Spouse name: N/A  . Number of children: N/A  . Years of education: N/A   Social History Main Topics  . Smoking status: Never Smoker  . Smokeless tobacco: Never Used  . Alcohol use 1.2 oz/week    2 Cans of beer per week     Comment: social  . Drug use: No  . Sexual activity: Yes   Other Topics Concern  . Not on file    Social History Narrative  . 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    Allergies:   Allergies  Allergen Reactions  . Amantadine Other (See Comments)    fatigue sleepy    Metabolic Disorder Labs: Lab Results  Component Value Date   HGBA1C 6.5 (H) 01/28/2016   MPG 140 01/28/2016   No results found for: PROLACTIN No results found for: CHOL, TRIG, HDL, CHOLHDL, VLDL, LDLCALC   Current Medications: Current Outpatient Prescriptions  Medication Sig Dispense Refill  . acetaminophen (TYLENOL) 325 MG tablet Take 650 mg by mouth every 6 (six) hours as needed.    . carbidopa-levodopa (SINEMET IR) 25-100 MG per tablet Take 1 tablet by mouth 3 (three) times daily.    . cyanocobalamin 1000 MCG tablet Take 1,000 mcg by mouth daily.    . hydrochlorothiazide (HYDRODIURIL) 25 MG tablet Take 25 mg by mouth daily.    Marland Kitchen HYDROcodone-acetaminophen (NORCO) 5-325 MG tablet Take 1 tablet by mouth every 4 (four) hours as needed for moderate pain. 30 tablet 0  . HYDROcodone-acetaminophen (NORCO) 5-325 MG tablet Take 1 tablet by mouth every 4 (four) hours as needed for moderate pain. 30 tablet 0  . ibuprofen (ADVIL,MOTRIN) 200 MG tablet Take 200 mg by mouth every 6 (six) hours as needed.    Marland Kitchen lisinopril (PRINIVIL,ZESTRIL) 20 MG tablet Take 20 mg by mouth 2 (two) times daily.    . sertraline (ZOLOFT) 50 MG tablet Take 1.5 tablets (75 mg total) by mouth daily. 135 tablet 1  . simvastatin (ZOCOR) 20 MG tablet Take 20 mg by mouth daily. Reported on 04/14/2015    . traZODone (DESYREL) 100 MG tablet Take 100 mg by mouth at bedtime.    . Vitamin D, Ergocalciferol, (DRISDOL) 50000 UNITS CAPS capsule Take 50,000 Units by mouth every 7 (seven) days.     No current facility-administered medications for this visit.      Neurologic: Headache: No Seizure: No Paresthesias:No  Musculoskeletal: Strength & Muscle Tone: within normal limits Gait & Station: unsteady Patient leans: N/A  Psychiatric Specialty Exam: Review of Systems  Constitutional: Positive for weight loss.  Neurological: Positive for tremors, focal weakness and weakness.  Psychiatric/Behavioral: Positive for depression. The patient is nervous/anxious and has insomnia.   All other systems reviewed and are negative.   There were no vitals taken for this visit.There is no height or weight on file to calculate BMI.  General Appearance: Casual  Eye Contact:  Fair  Speech:  Clear and Coherent  Volume:  Normal  Mood:  Anxious  Affect:  Congruent  Thought Process:  Coherent  Orientation:  Full (Time, Place, and Person)  Thought Content:  WDL  Suicidal Thoughts:  No  Homicidal Thoughts:  No  Memory:  Immediate;   Fair  Judgement:  Fair  Insight:  Fair  Psychomotor Activity:  Normal  Concentration:  Fair  Recall:  [employer] of Knowledge:Fair  Language: Fair  Akathisia:  No  Handed:  Right  AIMS (if indicated):    Assets:  Communication Skills Desire for Improvement Social Support  ADL's:  Intact  Cognition: WNL  Sleep:      Treatment Plan Summary: Medication management   Discussed with patient at length about his medications treatment risks benefits  Continue Zoloft 50 ng daily mg by mouth daily. He gets 90 day supply at a time. D/c trazodone  Patient also takes Neurontin as prescribed by his neurologist Follow-up in 3 months or earlier depending on his symptoms.    More than 50% of the time spent in psychoeducation, counseling and coordination of care.    This note was generated in part or whole with voice recognition software. Voice regonition is usually quite accurate but there are transcription errors that can and very often do occur. I apologize for any typographical errors that were not detected and  corrected.   Rainey Pines, MD 5/9/20189:31 AM

## 2016-06-06 ENCOUNTER — Other Ambulatory Visit: Payer: Self-pay | Admitting: Internal Medicine

## 2016-06-06 ENCOUNTER — Ambulatory Visit
Admission: RE | Admit: 2016-06-06 | Discharge: 2016-06-06 | Disposition: A | Discharge status: Home / Self Care | Payer: Federal, State, Local not specified - PPO | Source: Ambulatory Visit | Attending: Internal Medicine | Admitting: Internal Medicine

## 2016-06-06 DIAGNOSIS — R002 Palpitations: Secondary | ICD-10-CM | POA: Diagnosis present

## 2016-06-06 DIAGNOSIS — M79604 Pain in right leg: Secondary | ICD-10-CM

## 2016-06-06 DIAGNOSIS — R609 Edema, unspecified: Secondary | ICD-10-CM

## 2016-06-06 DIAGNOSIS — M7989 Other specified soft tissue disorders: Secondary | ICD-10-CM | POA: Diagnosis not present

## 2016-07-13 ENCOUNTER — Ambulatory Visit: Payer: Federal, State, Local not specified - PPO | Admitting: Psychiatry

## 2016-07-14 ENCOUNTER — Other Ambulatory Visit: Payer: Self-pay | Admitting: Internal Medicine

## 2016-07-14 DIAGNOSIS — R748 Abnormal levels of other serum enzymes: Secondary | ICD-10-CM

## 2016-07-20 ENCOUNTER — Ambulatory Visit
Admission: RE | Admit: 2016-07-20 | Discharge: 2016-07-20 | Disposition: A | Discharge status: Home / Self Care | Payer: Federal, State, Local not specified - PPO | Source: Ambulatory Visit | Attending: Internal Medicine | Admitting: Internal Medicine

## 2016-07-20 DIAGNOSIS — R748 Abnormal levels of other serum enzymes: Secondary | ICD-10-CM | POA: Insufficient documentation

## 2016-07-20 DIAGNOSIS — M4856XA Collapsed vertebra, not elsewhere classified, lumbar region, initial encounter for fracture: Secondary | ICD-10-CM | POA: Diagnosis not present

## 2016-07-20 DIAGNOSIS — K449 Diaphragmatic hernia without obstruction or gangrene: Secondary | ICD-10-CM | POA: Diagnosis not present

## 2016-07-20 DIAGNOSIS — M4854XA Collapsed vertebra, not elsewhere classified, thoracic region, initial encounter for fracture: Secondary | ICD-10-CM | POA: Insufficient documentation

## 2016-07-20 DIAGNOSIS — K760 Fatty (change of) liver, not elsewhere classified: Secondary | ICD-10-CM | POA: Insufficient documentation

## 2016-07-20 DIAGNOSIS — I700 Atherosclerosis of aorta: Secondary | ICD-10-CM | POA: Diagnosis not present

## 2016-07-20 DIAGNOSIS — I2510 Atherosclerotic heart disease of native coronary artery without angina pectoris: Secondary | ICD-10-CM | POA: Diagnosis not present

## 2016-07-20 MED ORDER — IOPAMIDOL (ISOVUE-370) INJECTION 76%
100.0000 mL | Freq: Once | INTRAVENOUS | Status: AC | PRN
  Administered 2016-07-20: 100 mL via INTRAVENOUS

## 2016-08-08 ENCOUNTER — Encounter: Payer: Self-pay | Admitting: Psychiatry

## 2016-08-08 ENCOUNTER — Ambulatory Visit (INDEPENDENT_AMBULATORY_CARE_PROVIDER_SITE_OTHER): Payer: Federal, State, Local not specified - PPO | Admitting: Psychiatry

## 2016-08-08 VITALS — BP 126/86 | HR 76 | Temp 98.8°F | Wt 144.8 lb

## 2016-08-08 DIAGNOSIS — G20 Parkinson's disease: Secondary | ICD-10-CM

## 2016-08-08 DIAGNOSIS — F39 Unspecified mood [affective] disorder: Secondary | ICD-10-CM

## 2016-08-08 MED ORDER — SERTRALINE HCL 50 MG PO TABS: 50.0000 mg | ORAL_TABLET | Freq: Every day | ORAL | 1 refills | Status: DC

## 2016-08-08 NOTE — Progress Notes (Signed)
Psychiatric MD Progress Note   Patient Identification: Charles Charles MRN:  [id number] Date of Evaluation:  08/08/2016 Referral Source: Charles Charles- Therapist  Chief Complaint:   Chief Complaint    Follow-up; Medication Refill     Visit Diagnosis:    ICD-10-CM   1. Episodic mood disorder ([place]) F39   2. Parkinson disease ([place]) G20     History of Present Illness:    Patient is a 61 -year-old male who presented for follow-up. He has history of Parkinson's disease Patient  appeared  weak and he Was walking with a limp and was using a cane. He reported that he can walk well with the help of walker but his wife does not like him using a walker. He reported that he has been falling more often due to worsening of his Parkinson's symptoms. He stated that he is going to talk to his wife so he can start using walker on a regular basis. We discussed about his medications. He stated that he has been compliant with his medications and has been sleeping well at night. He stated that he went to the beach with his daughter and his daughter sank over there. He enjoyed singing in the past but has not been singing now. He has been enjoying the company of his grandchildren. Patient remains pleasant and polite during the interview. He reported that he has been diagnosed recently with cirrhosis but it is in early stages and he is going to stop drinking now. He stated that he has been working on his physical health.   He is compliant with his medications. He denied having any suicidal homicidal ideations or plans.    He appeared pleasant and cooperative during the interview.  Associated Signs/Symptoms: Depression Symptoms:  fatigue, anxiety, (Hypo) Manic Symptoms:  Irritable Mood, Labiality of Mood, Anxiety Symptoms:  Excessive Worry, Psychotic Symptoms:  none PTSD Symptoms: Negative NA  Past Psychiatric History: Patient reported that he has never seen a psychiatrist in the past. He denied any history of suicide  attempts. He denied any history of psychiatric hospitalization.   Previous Psychotropic Medications:  Celexa Trazodone  Substance Abuse History in the last 12 months:  Yes.   Drinking alcohol on a daily basis  Consequences of Substance Abuse: Negative NA  Past Medical History:  Past Medical History:  Diagnosis Date  . Abnormal liver function   . Anemia   . Anxiety   . Colonic mass   . Depression   . Dizziness   . Dry cough   . History of colon polyps   . Hypercholesteremia   . Hyperlipemia   . Hypertension   . Palpitations   . Parkinson's disease ([place])   . Parkinson's disease ([place])   . PVD (peripheral vascular disease) ([place])   . Tremors of nervous system     Past Surgical History:  Procedure Laterality Date  . BACK SURGERY     Kyphoplasty   March 2018  . COLONOSCOPY    . HERNIA REPAIR     left inguinal hernia repair as an infant  . KYPHOPLASTY N/A 02/11/2016   Procedure: KYPHOPLASTY  L2,T9;  Surgeon: Hessie Knows, MD;  Location: [facility] ORS;  Service: Orthopedics;  Laterality: N/A;  . KYPHOPLASTY N/A 03/15/2016   Procedure: KYPHOPLASTY L3;  Surgeon: Hessie Knows, MD;  Location: [facility] ORS;  Service: Orthopedics;  Laterality: N/A;  . skin cancer removed    . TONSILLECTOMY      Family Psychiatric History:  Mother- Dementia Father- anxiety  Family History:  Family History  Problem Relation Age of Onset  . Heart Problems Mother   . Dementia Mother     Social History:   Social History   Social History  . Marital status: Married    Spouse name: N/A  . Number of children: N/A  . Years of education: N/A   Social History Main Topics  . Smoking status: Never Smoker  . Smokeless tobacco: Never Used  . Alcohol use 1.2 oz/week    2 Cans of beer per week     Comment: social  . Drug use: No  . Sexual activity: Yes   Other Topics Concern  . None   Social History Narrative  . 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    Allergies:   Allergies  Allergen Reactions  . Amantadine Other (See Comments)    fatigue sleepy    Metabolic Disorder Labs: Lab Results  Component Value Date   HGBA1C 6.5 (H) 01/28/2016   MPG 140 01/28/2016   No results found for: PROLACTIN No results found for: CHOL, TRIG, HDL, CHOLHDL, VLDL, LDLCALC   Current Medications: Current Outpatient Prescriptions  Medication Sig Dispense Refill  . carbidopa-levodopa (SINEMET IR) 25-100 MG per tablet Take 1 tablet by mouth 3 (three) times daily.    . cyanocobalamin 1000 MCG tablet Take 1,000 mcg by mouth daily.    Marland Kitchen gabapentin (NEURONTIN) 300 MG capsule Take by mouth.    . hydrochlorothiazide (HYDRODIURIL) 25 MG tablet Take 25 mg by mouth daily.    Marland Kitchen ibuprofen (ADVIL,MOTRIN) 200 MG tablet Take 200 mg by mouth every 6 (six) hours as needed.    Marland Kitchen lisinopril (PRINIVIL,ZESTRIL) 20 MG tablet Take 20 mg by mouth 2 (two) times daily.    . sertraline (ZOLOFT) 50 MG tablet Take 1 tablet (50 mg total) by mouth daily. 90 tablet 1  . simvastatin (ZOCOR) 20 MG tablet Take 20 mg by mouth daily. Reported on 04/14/2015    . Vitamin D, Ergocalciferol, (DRISDOL) 50000 UNITS CAPS capsule Take 50,000 Units by mouth every 7 (seven) days.     No current facility-administered medications for this visit.     Neurologic: Headache: No Seizure: No Paresthesias:No  Musculoskeletal: Strength & Muscle Tone: within normal limits Gait & Station: unsteady Patient leans: N/A  Psychiatric Specialty Exam: Review of Systems  Constitutional: Positive for weight loss.  Neurological: Positive for tremors, focal weakness and weakness.  Psychiatric/Behavioral: Positive for depression. The patient is nervous/anxious and has insomnia.   All other systems reviewed and are negative.   Blood pressure 126/86, pulse 76, temperature  98.8 F (37.1 C), temperature source Oral, weight 144 lb 12.8 oz (65.7 kg).Body mass index is 22.02 kg/m.  General Appearance: Casual  Eye Contact:  Fair  Speech:  Clear and Coherent  Volume:  Normal  Mood:  Anxious  Affect:  Congruent  Thought Process:  Coherent  Orientation:  Full (Time, Place, and Person)  Thought Content:  WDL  Suicidal Thoughts:  No  Homicidal Thoughts:  No  Memory:  Immediate;   Fair  Judgement:  Fair  Insight:  Fair  Psychomotor Activity:  Normal  Concentration:  Fair  Recall:  [employer] of Knowledge:Fair  Language: Fair  Akathisia:  No  Handed:  Right  AIMS (if indicated):    Assets:  Communication Skills Desire for Improvement Social Support  ADL's:  Intact  Cognition: WNL  Sleep:      Treatment Plan Summary: Medication management   Discussed with patient at length about his medications treatment risks benefits  Continue Zoloft 50 ng daily mg by mouth daily. He gets 90 day supply at a time.  Patient also takes Neurontin as prescribed by his neurologist Follow-up in 3 months or earlier depending on his symptoms.    More than 50% of the time spent in psychoeducation, counseling and coordination of care.    This note was generated in part or whole with voice recognition software. Voice regonition is usually quite accurate but there are transcription errors that can and very often do occur. I apologize for any typographical errors that were not detected and corrected.   Rainey Pines, MD 8/6/201811:35 AM

## 2016-09-20 ENCOUNTER — Emergency Department: Payer: Federal, State, Local not specified - PPO

## 2016-09-20 ENCOUNTER — Inpatient Hospital Stay
Admission: EM | Admit: 2016-09-20 | Discharge: 2016-09-22 | DRG: 383 | Disposition: A | Discharge status: Home / Self Care | Payer: Federal, State, Local not specified - PPO | Attending: Internal Medicine | Admitting: Internal Medicine

## 2016-09-20 ENCOUNTER — Encounter: Payer: Self-pay | Admitting: Emergency Medicine

## 2016-09-20 ENCOUNTER — Inpatient Hospital Stay: Payer: Federal, State, Local not specified - PPO

## 2016-09-20 DIAGNOSIS — G20 Parkinson's disease: Secondary | ICD-10-CM | POA: Diagnosis present

## 2016-09-20 DIAGNOSIS — I10 Essential (primary) hypertension: Secondary | ICD-10-CM | POA: Diagnosis present

## 2016-09-20 DIAGNOSIS — Z86010 Personal history of colonic polyps: Secondary | ICD-10-CM | POA: Diagnosis not present

## 2016-09-20 DIAGNOSIS — K920 Hematemesis: Secondary | ICD-10-CM | POA: Diagnosis present

## 2016-09-20 DIAGNOSIS — I739 Peripheral vascular disease, unspecified: Secondary | ICD-10-CM | POA: Diagnosis present

## 2016-09-20 DIAGNOSIS — Z85828 Personal history of other malignant neoplasm of skin: Secondary | ICD-10-CM

## 2016-09-20 DIAGNOSIS — K760 Fatty (change of) liver, not elsewhere classified: Secondary | ICD-10-CM | POA: Diagnosis present

## 2016-09-20 DIAGNOSIS — F1010 Alcohol abuse, uncomplicated: Secondary | ICD-10-CM | POA: Diagnosis present

## 2016-09-20 DIAGNOSIS — K449 Diaphragmatic hernia without obstruction or gangrene: Secondary | ICD-10-CM | POA: Diagnosis present

## 2016-09-20 DIAGNOSIS — K210 Gastro-esophageal reflux disease with esophagitis: Secondary | ICD-10-CM | POA: Diagnosis present

## 2016-09-20 DIAGNOSIS — K7460 Unspecified cirrhosis of liver: Secondary | ICD-10-CM | POA: Diagnosis present

## 2016-09-20 DIAGNOSIS — E7800 Pure hypercholesterolemia, unspecified: Secondary | ICD-10-CM | POA: Diagnosis present

## 2016-09-20 DIAGNOSIS — K259 Gastric ulcer, unspecified as acute or chronic, without hemorrhage or perforation: Secondary | ICD-10-CM | POA: Diagnosis present

## 2016-09-20 DIAGNOSIS — Z8249 Family history of ischemic heart disease and other diseases of the circulatory system: Secondary | ICD-10-CM | POA: Diagnosis not present

## 2016-09-20 DIAGNOSIS — Z7982 Long term (current) use of aspirin: Secondary | ICD-10-CM | POA: Diagnosis not present

## 2016-09-20 DIAGNOSIS — J189 Pneumonia, unspecified organism: Secondary | ICD-10-CM | POA: Diagnosis present

## 2016-09-20 DIAGNOSIS — K922 Gastrointestinal hemorrhage, unspecified: Secondary | ICD-10-CM | POA: Diagnosis present

## 2016-09-20 DIAGNOSIS — K209 Esophagitis, unspecified: Secondary | ICD-10-CM | POA: Diagnosis not present

## 2016-09-20 DIAGNOSIS — R945 Abnormal results of liver function studies: Secondary | ICD-10-CM

## 2016-09-20 DIAGNOSIS — Z888 Allergy status to other drugs, medicaments and biological substances status: Secondary | ICD-10-CM | POA: Diagnosis not present

## 2016-09-20 DIAGNOSIS — Z79899 Other long term (current) drug therapy: Secondary | ICD-10-CM

## 2016-09-20 DIAGNOSIS — E785 Hyperlipidemia, unspecified: Secondary | ICD-10-CM | POA: Diagnosis present

## 2016-09-20 DIAGNOSIS — R0600 Dyspnea, unspecified: Secondary | ICD-10-CM

## 2016-09-20 DIAGNOSIS — R509 Fever, unspecified: Secondary | ICD-10-CM

## 2016-09-20 DIAGNOSIS — F329 Major depressive disorder, single episode, unspecified: Secondary | ICD-10-CM | POA: Diagnosis present

## 2016-09-20 DIAGNOSIS — R7989 Other specified abnormal findings of blood chemistry: Secondary | ICD-10-CM

## 2016-09-20 DIAGNOSIS — F419 Anxiety disorder, unspecified: Secondary | ICD-10-CM | POA: Diagnosis present

## 2016-09-20 LAB — HEMOGLOBIN
Hemoglobin: 14.2 g/dL (ref 13.0–18.0)
Hemoglobin: 13.7 g/dL (ref 13.0–18.0)

## 2016-09-20 LAB — COMPREHENSIVE METABOLIC PANEL
ALK PHOS: 183 U/L — AB (ref 38–126)
ALT: 20 U/L (ref 17–63)
AST: 64 U/L — AB (ref 15–41)
Albumin: 3.9 g/dL (ref 3.5–5.0)
Anion gap: 11 (ref 5–15)
BUN: <5 mg/dL — ABNORMAL LOW (ref 6–20)
CALCIUM: 9.1 mg/dL (ref 8.9–10.3)
CO2: 26 mmol/L (ref 22–32)
Chloride: 97 mmol/L — ABNORMAL LOW (ref 101–111)
Creatinine, Ser: 0.5 mg/dL — ABNORMAL LOW (ref 0.61–1.24)
GFR calc Af Amer: >60 mL/min (ref >60)
GFR calc non Af Amer: >60 mL/min (ref >60)
GLUCOSE: 129 mg/dL — AB (ref 65–99)
Potassium: 3.6 mmol/L (ref 3.5–5.1)
SODIUM: 134 mmol/L — AB (ref 135–145)
Total Bilirubin: 1.8 mg/dL — ABNORMAL HIGH (ref 0.3–1.2)
Total Protein: 6.7 g/dL (ref 6.5–8.1)

## 2016-09-20 LAB — PROTIME-INR
INR: 1
Prothrombin Time: 13.1 seconds (ref 11.4–15.2)

## 2016-09-20 LAB — URINALYSIS, COMPLETE (UACMP) WITH MICROSCOPIC
Bacteria, UA: NONE SEEN
Bilirubin Urine: NEGATIVE
GLUCOSE, UA: NEGATIVE mg/dL
Hgb urine dipstick: NEGATIVE
Ketones, ur: NEGATIVE mg/dL
LEUKOCYTES UA: NEGATIVE
Nitrite: NEGATIVE
PROTEIN: NEGATIVE mg/dL
SQUAMOUS EPITHELIAL / LPF: NONE SEEN
Specific Gravity, Urine: 1.006 (ref 1.005–1.030)
pH: 6 (ref 5.0–8.0)

## 2016-09-20 LAB — CBC
HCT: 45.6 % (ref 40.0–52.0)
HEMOGLOBIN: 15.6 g/dL (ref 13.0–18.0)
MCH: 33.5 pg (ref 26.0–34.0)
MCHC: 34.3 g/dL (ref 32.0–36.0)
MCV: 97.7 fL (ref 80.0–100.0)
PLATELETS: 169 10*3/uL (ref 150–440)
RBC: 4.67 MIL/uL (ref 4.40–5.90)
RDW: 20.2 % — ABNORMAL HIGH (ref 11.5–14.5)
WBC: 13.3 10*3/uL — ABNORMAL HIGH (ref 3.8–10.6)

## 2016-09-20 LAB — TYPE AND SCREEN
ABO/RH(D): B POS
Antibody Screen: NEGATIVE

## 2016-09-20 LAB — MRSA PCR SCREENING: MRSA by PCR: NEGATIVE

## 2016-09-20 LAB — LACTIC ACID, PLASMA
LACTIC ACID, VENOUS: 1.5 mmol/L (ref 0.5–1.9)
Lactic Acid, Venous: 1.6 mmol/L (ref 0.5–1.9)

## 2016-09-20 MED ORDER — PIPERACILLIN-TAZOBACTAM 3.375 G IVPB 30 MIN
INTRAVENOUS | Status: AC
  Administered 2016-09-20: 3.375 g via INTRAVENOUS
  Filled 2016-09-20: qty 50

## 2016-09-20 MED ORDER — SODIUM CHLORIDE 0.9 % IV SOLN
INTRAVENOUS | Status: DC
  Administered 2016-09-20 – 2016-09-21 (×3): via INTRAVENOUS

## 2016-09-20 MED ORDER — CEFTRIAXONE SODIUM IN DEXTROSE 20 MG/ML IV SOLN: 1.0000 g | Freq: Once | INTRAVENOUS | Status: DC

## 2016-09-20 MED ORDER — LORAZEPAM 2 MG/ML IJ SOLN
2.0000 mg | Freq: Once | INTRAMUSCULAR | Status: AC
  Administered 2016-09-20: 2 mg via INTRAVENOUS
  Filled 2016-09-20: qty 1

## 2016-09-20 MED ORDER — LORAZEPAM 1 MG PO TABS: 1.0000 mg | ORAL_TABLET | Freq: Four times a day (QID) | ORAL | Status: DC | PRN

## 2016-09-20 MED ORDER — OXYCODONE HCL 5 MG PO TABS: 5.0000 mg | ORAL_TABLET | ORAL | Status: DC | PRN

## 2016-09-20 MED ORDER — SERTRALINE HCL 50 MG PO TABS
50.0000 mg | ORAL_TABLET | Freq: Every day | ORAL | Status: DC
  Administered 2016-09-20 – 2016-09-22 (×3): 50 mg via ORAL
  Filled 2016-09-20 (×3): qty 1

## 2016-09-20 MED ORDER — THIAMINE HCL 100 MG/ML IJ SOLN
100.0000 mg | Freq: Every day | INTRAMUSCULAR | Status: DC
  Filled 2016-09-20 (×2): qty 1

## 2016-09-20 MED ORDER — FOLIC ACID 1 MG PO TABS
1.0000 mg | ORAL_TABLET | Freq: Every day | ORAL | Status: DC
  Administered 2016-09-20 – 2016-09-22 (×3): 1 mg via ORAL
  Filled 2016-09-20 (×3): qty 1

## 2016-09-20 MED ORDER — ONDANSETRON HCL 4 MG PO TABS: 4.0000 mg | ORAL_TABLET | Freq: Four times a day (QID) | ORAL | Status: DC | PRN

## 2016-09-20 MED ORDER — CARBIDOPA-LEVODOPA ER 50-200 MG PO TBCR
1.0000 | EXTENDED_RELEASE_TABLET | Freq: Every day | ORAL | Status: DC
  Administered 2016-09-20 – 2016-09-21 (×2): 1 via ORAL
  Filled 2016-09-20 (×3): qty 1

## 2016-09-20 MED ORDER — ALBUTEROL SULFATE (2.5 MG/3ML) 0.083% IN NEBU
2.5000 mg | INHALATION_SOLUTION | RESPIRATORY_TRACT | Status: DC | PRN
  Administered 2016-09-21: 14:00:00 2.5 mg via RESPIRATORY_TRACT
  Filled 2016-09-20: qty 3

## 2016-09-20 MED ORDER — SIMVASTATIN 20 MG PO TABS
20.0000 mg | ORAL_TABLET | Freq: Every day | ORAL | Status: DC
  Administered 2016-09-20 – 2016-09-21 (×2): 20 mg via ORAL
  Filled 2016-09-20 (×2): qty 1

## 2016-09-20 MED ORDER — GABAPENTIN 300 MG PO CAPS
300.0000 mg | ORAL_CAPSULE | Freq: Three times a day (TID) | ORAL | Status: DC
  Administered 2016-09-20 – 2016-09-22 (×5): 300 mg via ORAL
  Filled 2016-09-20 (×6): qty 1

## 2016-09-20 MED ORDER — INFLUENZA VAC SPLIT QUAD 0.5 ML IM SUSY: 0.5000 mL | PREFILLED_SYRINGE | INTRAMUSCULAR | Status: DC

## 2016-09-20 MED ORDER — ACETAMINOPHEN 650 MG RE SUPP: 650.0000 mg | Freq: Four times a day (QID) | RECTAL | Status: DC | PRN

## 2016-09-20 MED ORDER — SODIUM CHLORIDE 0.9 % IV SOLN
50.0000 ug/h | INTRAVENOUS | Status: DC
  Administered 2016-09-20 – 2016-09-21 (×3): 50 ug/h via INTRAVENOUS
  Filled 2016-09-20 (×7): qty 1

## 2016-09-20 MED ORDER — POLYETHYLENE GLYCOL 3350 17 G PO PACK: 17.0000 g | PACK | Freq: Every day | ORAL | Status: DC | PRN

## 2016-09-20 MED ORDER — VITAMIN B-1 100 MG PO TABS
100.0000 mg | ORAL_TABLET | Freq: Every day | ORAL | Status: DC
  Administered 2016-09-20 – 2016-09-22 (×3): 100 mg via ORAL
  Filled 2016-09-20 (×3): qty 1

## 2016-09-20 MED ORDER — ACETAMINOPHEN 325 MG PO TABS: 650.0000 mg | ORAL_TABLET | Freq: Four times a day (QID) | ORAL | Status: DC | PRN

## 2016-09-20 MED ORDER — SODIUM CHLORIDE 0.9 % IV SOLN
8.0000 mg/h | INTRAVENOUS | Status: DC
  Administered 2016-09-20 – 2016-09-21 (×3): 8 mg/h via INTRAVENOUS
  Filled 2016-09-20 (×3): qty 80

## 2016-09-20 MED ORDER — LISINOPRIL 20 MG PO TABS
20.0000 mg | ORAL_TABLET | Freq: Two times a day (BID) | ORAL | Status: DC
  Administered 2016-09-20 – 2016-09-22 (×5): 20 mg via ORAL
  Filled 2016-09-20 (×5): qty 1

## 2016-09-20 MED ORDER — SODIUM CHLORIDE 0.9% FLUSH
3.0000 mL | Freq: Two times a day (BID) | INTRAVENOUS | Status: DC
Start: 2016-09-20 — End: 2016-09-22
  Administered 2016-09-20 – 2016-09-21 (×3): 3 mL via INTRAVENOUS

## 2016-09-20 MED ORDER — POTASSIUM CHLORIDE CRYS ER 20 MEQ PO TBCR
20.0000 meq | EXTENDED_RELEASE_TABLET | Freq: Two times a day (BID) | ORAL | Status: DC
  Administered 2016-09-20 – 2016-09-22 (×5): 20 meq via ORAL
  Filled 2016-09-20 (×5): qty 1

## 2016-09-20 MED ORDER — PIPERACILLIN-TAZOBACTAM 3.375 G IVPB
3.3750 g | Freq: Three times a day (TID) | INTRAVENOUS | Status: DC
  Administered 2016-09-20 – 2016-09-21 (×3): 3.375 g via INTRAVENOUS
  Filled 2016-09-20 (×4): qty 50

## 2016-09-20 MED ORDER — ONDANSETRON HCL 4 MG/2ML IJ SOLN: 4.0000 mg | Freq: Four times a day (QID) | INTRAMUSCULAR | Status: DC | PRN

## 2016-09-20 MED ORDER — LORAZEPAM 2 MG/ML IJ SOLN
1.0000 mg | Freq: Four times a day (QID) | INTRAMUSCULAR | Status: DC | PRN
Start: 2016-09-20 — End: 2016-09-22

## 2016-09-20 MED ORDER — PANTOPRAZOLE SODIUM 40 MG IV SOLR: 40.0000 mg | Freq: Two times a day (BID) | INTRAVENOUS | Status: DC

## 2016-09-20 MED ORDER — ADULT MULTIVITAMIN W/MINERALS CH
1.0000 | ORAL_TABLET | Freq: Every day | ORAL | Status: DC
  Administered 2016-09-20 – 2016-09-22 (×3): 1 via ORAL
  Filled 2016-09-20 (×3): qty 1

## 2016-09-20 MED ORDER — SODIUM CHLORIDE 0.9 % IV BOLUS (SEPSIS)
1000.0000 mL | Freq: Once | INTRAVENOUS | Status: AC
  Administered 2016-09-20: 1000 mL via INTRAVENOUS

## 2016-09-20 MED ORDER — PANTOPRAZOLE SODIUM 40 MG IV SOLR
80.0000 mg | Freq: Once | INTRAVENOUS | Status: AC
  Administered 2016-09-20: 10:00:00 80 mg via INTRAVENOUS
  Filled 2016-09-20: qty 80

## 2016-09-20 NOTE — ED Triage Notes (Signed)
Pt vomiting coffee ground emesis since last night. Is a daily drinker. No hx varices.  No pain. 8 mg zofran by ems. Hx cirrhosis.

## 2016-09-20 NOTE — H&P (Signed)
[facility] NAME: Charles Stanton    MR#:  [id number]  DATE OF BIRTH:  [birth year]  DATE OF ADMISSION:  09/20/2016  PRIMARY CARE PHYSICIAN: [name], MD   REQUESTING/REFERRING PHYSICIAN: Dr. Burlene Arnt  CHIEF COMPLAINT:   Chief Complaint  Patient presents with  . Emesis    HISTORY OF PRESENT ILLNESS:  Charles Stanton  is a 61 y.o. male with a known history of Alcohol abuse, cirrhosis, hypertension, Parkinson's presents to the emergency room due to vomiting that started yesterday and started noticing coffee grounds in the emesis today morning. No vomiting in the emergency room. He feels dizzy on standing up along with weakness. Tachycardia up to 120. Hemoglobin at 15. He was also noticed to have fever of 100.5 in the emergency room with WBC of 13. No shortness of breath. He uses occasional Aleve which is very rare. Does not use aspirin or other blood thinners. Drinks alcohol daily. Recent CT scan of the abdomen showed signs of cirrhosis. No diagnosis of esophageal varices or peptic ulcer disease. Patient is critically ill and will be admitted to the hospitalist service.  PAST MEDICAL HISTORY:   Past Medical History:  Diagnosis Date  . Abnormal liver function   . Anemia   . Anxiety   . Colonic mass   . Depression   . Dizziness   . Dry cough   . History of colon polyps   . Hypercholesteremia   . Hyperlipemia   . Hypertension   . Palpitations   . Parkinson's disease ([place])   . Parkinson's disease ([place])   . PVD (peripheral vascular disease) ([place])   . Tremors of nervous system     PAST SURGICAL HISTORY:   Past Surgical History:  Procedure Laterality Date  . BACK SURGERY     Kyphoplasty   March 2018  . COLONOSCOPY    . HERNIA REPAIR     left inguinal hernia repair as an infant  . KYPHOPLASTY N/A 02/11/2016   Procedure: KYPHOPLASTY  L2,T9;  Surgeon: Hessie Knows, MD;  Location: [facility] ORS;  Service: Orthopedics;  Laterality:  N/A;  . KYPHOPLASTY N/A 03/15/2016   Procedure: KYPHOPLASTY L3;  Surgeon: Hessie Knows, MD;  Location: [facility] ORS;  Service: Orthopedics;  Laterality: N/A;  . skin cancer removed    . TONSILLECTOMY      SOCIAL HISTORY:   Social History  Substance Use Topics  . Smoking status: Never Smoker  . Smokeless tobacco: Never Used  . Alcohol use 1.2 oz/week    2 Cans of beer per week     Comment: social    FAMILY HISTORY:   Family History  Problem Relation Age of Onset  . Heart Problems Mother   . Dementia Mother     DRUG ALLERGIES:   Allergies  Allergen Reactions  . Amantadine Other (See Comments)    fatigue sleepy    REVIEW OF SYSTEMS:   Review of Systems  Constitutional: Positive for malaise/fatigue. Negative for chills and fever.  HENT: Negative for sore throat.   Eyes: Negative for blurred vision, double vision and pain.  Respiratory: Negative for cough, hemoptysis, shortness of breath and wheezing.   Cardiovascular: Negative for chest pain, palpitations, orthopnea and leg swelling.  Gastrointestinal: Positive for abdominal pain, nausea and vomiting. Negative for constipation, diarrhea and heartburn.  Genitourinary: Negative for dysuria and hematuria.  Musculoskeletal: Negative for back pain and joint pain.  Skin: Negative for rash.  Neurological: Positive for  dizziness, tremors and weakness. Negative for sensory change, speech change, focal weakness and headaches.  Endo/Heme/Allergies: Does not bruise/bleed easily.  Psychiatric/Behavioral: Negative for depression. The patient is not nervous/anxious.     MEDICATIONS AT HOME:   Prior to Admission medications   Medication Sig Start Date End Date Taking? Authorizing Provider  aspirin EC 81 MG tablet Take 81 mg by mouth daily.   Yes [provider]  carbidopa-levodopa (SINEMET CR) 50-200 MG tablet Take 1 tablet by mouth at bedtime. 08/14/16  Yes [provider]  cyanocobalamin 1000 MCG tablet Take 1,000  mcg by mouth daily.   Yes [provider]  gabapentin (NEURONTIN) 300 MG capsule Take 300 mg by mouth 3 (three) times daily.  04/04/16 04/04/17 Yes [provider]  ibuprofen (ADVIL,MOTRIN) 200 MG tablet Take 200 mg by mouth every 6 (six) hours as needed.   Yes [provider]  lisinopril (PRINIVIL,ZESTRIL) 20 MG tablet Take 20 mg by mouth 2 (two) times daily.   Yes [provider]  potassium chloride SA (K-DUR,KLOR-CON) 20 MEQ tablet Take 20 mEq by mouth 2 (two) times daily. 06/29/16  Yes [provider]  sertraline (ZOLOFT) 50 MG tablet Take 1 tablet (50 mg total) by mouth daily. 08/08/16  Yes Rainey Pines, MD  simvastatin (ZOCOR) 20 MG tablet Take 20 mg by mouth at bedtime. Reported on 04/14/2015   Yes [provider]     VITAL SIGNS:  Blood pressure (!) 151/98, pulse (!) 107, temperature (!) 100.5 F (38.1 C), temperature source Oral, resp. rate (!) 28, height 5\' 5"  (1.651 m), weight 68 kg (150 lb), SpO2 95 %.  PHYSICAL EXAMINATION:  Physical Exam  GENERAL:  61 y.o.-year-old patient lying in the bed  EYES: Pupils equal, round, reactive to light and accommodation. No scleral icterus. Extraocular muscles intact.  HEENT: Head atraumatic, normocephalic. Oropharynx and nasopharynx clear. No oropharyngeal erythema, moist oral mucosa  NECK:  Supple, no jugular venous distention. No thyroid enlargement, no tenderness.  LUNGS: Normal breath sounds bilaterally, no wheezing, rales, rhonchi. No use of accessory muscles of respiration.  CARDIOVASCULAR: S1, S2 Tachycardia. No murmurs, rubs, or gallops.  ABDOMEN: Soft, nontender, nondistended. Bowel sounds present. No organomegaly or mass.  EXTREMITIES: No pedal edema, cyanosis, or clubbing. + 2 pedal & radial pulses b/l.   NEUROLOGIC: Cranial nerves II through XII are intact. No focal Motor or sensory deficits appreciated b/l.Parkinson's tremor present PSYCHIATRIC: The patient is alert and oriented x 3.  Good affect.  SKIN: No obvious rash, lesion, or ulcer.   LABORATORY PANEL:   CBC  Recent Labs Lab 09/20/16 0841  WBC 13.3*  HGB 15.6  HCT 45.6  PLT 169   ------------------------------------------------------------------------------------------------------------------  Chemistries   Recent Labs Lab 09/20/16 0841  NA 134*  K 3.6  CL 97*  CO2 26  GLUCOSE 129*  BUN <5*  CREATININE 0.50*  CALCIUM 9.1  AST 64*  ALT 20  ALKPHOS 183*  BILITOT 1.8*   ------------------------------------------------------------------------------------------------------------------  Cardiac Enzymes No results for input(s): TROPONINI in the last 168 hours. ------------------------------------------------------------------------------------------------------------------  RADIOLOGY:  No results found.   IMPRESSION AND PLAN:   * Upper GI bleed with coffee-ground emesis. Likely causes for gastritis or peptic ulcer disease or esophageal varices due to history of cirrhosis. Will start IV proton next drip and octreotide drip. IV antibiotics. Discussed her doctor of GI. EGD once stabilized. Serial hemoglobin checks. Transfuse if significant drop in hemoglobin or symptomatic. We'll bolus 1 L normal saline stat. Nothing  by mouth.  * Sepsis. Etiology unclear. Check urine and chest x-[place]ia. Could have aspirated. We'll start IV Zosyn. Cultures sent from emergency room. Bolus normal saline now. Check lactic acid.  * Hypertension. Continue home medications.  * Alcohol abuse. Start on CIWA protocol for withdrawals.  * Parkinson's disease. Continue carbidopa.  * DVT prophylaxis with SCDs.  All the records are reviewed and case discussed with ED provider. Management plans discussed with the patient, family and they are in agreement.  CODE STATUS: Full code  TOTAL CC TIME TAKING CARE OF THIS PATIENT: 40 minutes.   Hillary Bow R M.D on 09/20/2016 at 9:46 AM  Between 7am to 6pm - Pager -  [phone]  After 6pm go to [email or web address] - password [email or web address] [facility]  Office  [phone]  CC: Primary care physician; [name], MD  Note: This dictation was prepared with Dragon dictation along with smaller phrase technology. Any transcriptional errors that result from this process are unintentional.

## 2016-09-20 NOTE — Consult Note (Signed)
Pharmacy Antibiotic Note  LINKYN GOBIN is a 61 y.o. male admitted on 09/20/2016 with aspiration PNA.  Pharmacy has been consulted for zosyn dosing.  Plan: Zosyn 3.375g IV q8h (4 hour infusion).  Height: 5\' 5"  (165.1 cm) Weight: 150 lb (68 kg) IBW/kg (Calculated) : 61.5  Temp (24hrs), Avg:100.5 F (38.1 C), Min:100.5 F (38.1 C), Max:100.5 F (38.1 C)   Recent Labs Lab 09/20/16 0841  WBC 13.3*  CREATININE 0.50*    Estimated Creatinine Clearance: 85.4 mL/min (A) (by C-G formula based on SCr of 0.5 mg/dL (L)).    Allergies  Allergen Reactions  . Amantadine Other (See Comments)    fatigue sleepy    Antimicrobials this admission: zosyn 9/18 >>    Dose adjustments this admission:  Microbiology results: 9/18 BCx: ordered 9/18 UCx: ordered    Thank you for allowing pharmacy to be a part of this patient's care.  Ramond Dial, Pharm.D, BCPS Clinical Pharmacist  09/20/2016 9:56 AM

## 2016-09-20 NOTE — Progress Notes (Signed)
[facility] responded to an OR for an AD education. [place] educated Pt and wife on the document. Pt and wife will review the materials and make a decision for completion at a later time.    09/20/16 1405  Clinical Encounter Type  Visited With Patient;Patient and family together;Health care provider  Visit Type Initial;Spiritual support  Referral From Nurse  Consult/Referral To Chaplain  Spiritual Encounters  Spiritual Needs Literature  Advance Directives (For Healthcare)  Does Patient Have a Medical Advance Directive? No  Would patient like information on creating a medical advance directive? Yes (Inpatient - patient requests chaplain consult to create a medical advance directive)  Blanket Directives  Does Patient Have a Mental Health Advance Directive? No  Would patient like information on creating a mental health advance directive? No - Patient declined

## 2016-09-20 NOTE — Consult Note (Signed)
Jonathon Bellows MD, MRCP(U.K) [address]  [place]  [place], [place] [address]  Main: [phone]  Fax: [phone]  Consultation  Referring Provider:     Dr Darvin Neighbours  Primary Care Physician:  [name], MD Primary Gastroenterologist:  [facility] .          Reason for Consultation:     Hematemesis  Date of Admission:  09/20/2016 Date of Consultation:  09/20/2016         HPI:   Charles Stanton is a 61 y.o. male h/o alcohol abuse, carries the diagnosis of liver cirrhosis presented to the Er today with coffe groune emesis. Hb 15 grams , tachycardic and with a fever. He says he was sick yesterday , initially was having vomiting with no blood in it but at 2 am had an episode of coffee ground emesis which recurred in the morning and none since. Stool is brown in color. Denies any abdominal pain or NSAID use.   Consumes 2-3 glasses of bourbon a day for many years. Presently no complaints  Ct abdomen 07/10/16- shows cirrhosis ,hepatic steatosis , compression fractures l1,l5,rib fractures. Moderate hiatal hernia. EGD 05/2013- Distal esophagitis, antral ulcers .   Past Medical History:  Diagnosis Date  . Abnormal liver function   . Anemia   . Anxiety   . Colonic mass   . Depression   . Dizziness   . Dry cough   . History of colon polyps   . Hypercholesteremia   . Hyperlipemia   . Hypertension   . Palpitations   . Parkinson's disease ([place])   . Parkinson's disease ([place])   . PVD (peripheral vascular disease) ([place])   . Tremors of nervous system     Past Surgical History:  Procedure Laterality Date  . BACK SURGERY     Kyphoplasty   March 2018  . COLONOSCOPY    . HERNIA REPAIR     left inguinal hernia repair as an infant  . KYPHOPLASTY N/A 02/11/2016   Procedure: KYPHOPLASTY  L2,T9;  Surgeon: Hessie Knows, MD;  Location: [facility] ORS;  Service: Orthopedics;  Laterality: N/A;  . KYPHOPLASTY N/A 03/15/2016   Procedure: KYPHOPLASTY L3;  Surgeon: Hessie Knows, MD;  Location:  [facility] ORS;  Service: Orthopedics;  Laterality: N/A;  . skin cancer removed    . TONSILLECTOMY      Prior to Admission medications   Medication Sig Start Date End Date Taking? Authorizing Provider  aspirin EC 81 MG tablet Take 81 mg by mouth daily.   Yes [provider]  carbidopa-levodopa (SINEMET CR) 50-200 MG tablet Take 1 tablet by mouth at bedtime. 08/14/16  Yes [provider]  cyanocobalamin 1000 MCG tablet Take 1,000 mcg by mouth daily.   Yes [provider]  gabapentin (NEURONTIN) 300 MG capsule Take 300 mg by mouth 3 (three) times daily.  04/04/16 04/04/17 Yes [provider]  ibuprofen (ADVIL,MOTRIN) 200 MG tablet Take 200 mg by mouth every 6 (six) hours as needed.   Yes [provider]  lisinopril (PRINIVIL,ZESTRIL) 20 MG tablet Take 20 mg by mouth 2 (two) times daily.   Yes [provider]  potassium chloride SA (K-DUR,KLOR-CON) 20 MEQ tablet Take 20 mEq by mouth 2 (two) times daily. 06/29/16  Yes [provider]  sertraline (ZOLOFT) 50 MG tablet Take 1 tablet (50 mg total) by mouth daily. 08/08/16  Yes Rainey Pines, MD  simvastatin (ZOCOR) 20 MG tablet Take 20 mg by mouth at bedtime. Reported  on 04/14/2015   Yes [provider]    Family History  Problem Relation Age of Onset  . Heart Problems Mother   . Dementia Mother      Social History  Substance Use Topics  . Smoking status: Never Smoker  . Smokeless tobacco: Never Used  . Alcohol use 1.2 oz/week    2 Cans of beer per week     Comment: social    Allergies as of 09/20/2016 - Review Complete 09/20/2016  Allergen Reaction Noted  . Amantadine Other (See Comments) 05/06/2015    Review of Systems:    All systems reviewed and negative except where noted in HPI.   Physical Exam:  Vital signs in last 24 hours: Temp:  [99.1 F (37.3 C)-100.5 F (38.1 C)] 99.1 F (37.3 C) (09/18 1157) Pulse Rate:  [69-107] 82 (09/18 1157) Resp:  [20-29] 20 (09/18  1157) BP: (122-151)/(73-98) 122/73 (09/18 1157) SpO2:  [93 %-99 %] 99 % (09/18 1157) Weight:  [143 lb 14.4 oz (65.3 kg)-150 lb (68 kg)] 143 lb 14.4 oz (65.3 kg) (09/18 1157)   General:   Pleasant, cooperative in NAD, appears tremolous  Head:  Normocephalic and atraumatic. Eyes:   No icterus.   Conjunctiva pink. PERRLA. Ears:  Normal auditory acuity. Neck:  Supple; no masses or thyroidomegaly Lungs: Respirations even and unlabored. Lungs clear to auscultation bilaterally.   No wheezes, crackles, or rhonchi.  Heart:  Regular rate and rhythm;  Without murmur, clicks, rubs or gallops Abdomen:  Soft, nondistended, nontender. Normal bowel sounds. No appreciable masses or hepatomegaly.  No rebound or guarding.  Extremities:  Without edema, cyanosis or clubbing. Neurologic:  Alert and oriented x3;  grossly normal neurologically. Skin:  Intact without significant lesions or rashes. Cervical Nodes:  No significant cervical adenopathy. Psych:  Alert and cooperative. Normal affect.  LAB RESULTS:  Recent Labs  09/20/16 0841 09/20/16 1332  WBC 13.3*  --   HGB 15.6 14.2  HCT 45.6  --   PLT 169  --    BMET  Recent Labs  09/20/16 0841  NA 134*  K 3.6  CL 97*  CO2 26  GLUCOSE 129*  BUN <5*  CREATININE 0.50*  CALCIUM 9.1   LFT  Recent Labs  09/20/16 0841  PROT 6.7  ALBUMIN 3.9  AST 64*  ALT 20  ALKPHOS 183*  BILITOT 1.8*   PT/INR  Recent Labs  09/20/16 0856  LABPROT 13.1  INR 1.00    STUDIES: Dg Chest 2 View  Result Date: 09/20/2016 CLINICAL DATA:  Hematemesis for 5 days EXAM: CHEST  2 VIEW COMPARISON:  January 27, 2016 FINDINGS: There is bibasilar atelectatic change with questionable mild superimposed scarring. There is edema or consolidation. Heart size and pulmonary vascularity are normal. No adenopathy. There is a focal hiatal hernia. Patient is status post kyphoplasty procedures at T9, L2, L3. There is anterior wedging of the L1 vertebral body. IMPRESSION:  Atelectatic change with mild superimposed scarring in the bases. No edema or consolidation. Heart size normal. Hiatal hernia present. Multiple compression fractures in the lower thoracic and visualized lumbar regions. Electronically Signed   By: Lowella Grip III M.D.   On: 09/20/2016 10:04      Impression / Plan:   Charles Stanton is a 61 y.o. y/o male with with a history of cirrhosis , admitted with hematemesis  . Hb stable. Alkaline phosphatase elevated which may be related to the bone fractures. History not typical of a variceal bleed.  Likely from hiatal hernia/esophagitis or a mallory weiss tear   Plan 1. NPO from midnight 2. IV Octreotide 3. EGD in am  4. RUQ USG to r/o biliary obstruction due to elevated LFT's  5. Advised to stop all alcohol  6. Clears till midnight   I have discussed alternative options, risks & benefits,  which include, but are not limited to, bleeding, infection, perforation,respiratory complication & drug reaction.  The patient agrees with this plan & written consent will be obtained.    Thank you for involving me in the care of this patient.      LOS: 0 days   Jonathon Bellows, MD  09/20/2016, 3:03 PM

## 2016-09-20 NOTE — ED Provider Notes (Addendum)
[facility]  ____________________________________________   I have reviewed the triage vital signs and the nursing notes.   HISTORY  Chief Complaint Emesis    HPI Charles Stanton is a 61 y.o. male who presents today complaining of hematemesis 5 since last night. Patient isn't an alcoholic, drinks regularly, does not usually go without alcohol. Last alcohol he states was 2 days ago. No history of withdrawal that he knows about but he is not really stopped drinking he states in the past. Patient has had no fever or chills. He denies abdominal pain. He is having dark red blood in his emesis. No known history of varices but he has been recently diagnosed by CT scan with cirrhosis. He Parkinson's disease, and has baseline tremor he states this is not much different from normal. He denies melena or bright red blood per rectum. Nothing makes symptoms better nothing makes symptoms worse,      Past Medical History:  Diagnosis Date  . Abnormal liver function   . Anemia   . Anxiety   . Colonic mass   . Depression   . Dizziness   . Dry cough   . History of colon polyps   . Hypercholesteremia   . Hyperlipemia   . Hypertension   . Palpitations   . Parkinson's disease ([place])   . Parkinson's disease ([place])   . PVD (peripheral vascular disease) ([place])   . Tremors of nervous system     Patient Active Problem List   Diagnosis Date Noted  . Basal cell carcinoma 02/08/2016  . Atrial fibrillation ([place]) 01/27/2016  . Colitis 01/27/2016  . HLD (hyperlipidemia) 01/27/2016  . Anxiety 01/27/2016  . Hypokalemia 01/27/2016  . Major depressive disorder, recurrent episode, moderate ([place]) 04/14/2015  . Anxiety, generalized 12/06/2013  . Parkinson's disease ([place]) 08/07/2013  . Open bite of lower leg 05/10/2013  . Dog bite of lower leg 05/10/2013  . Abnormal finding on liver function 03/25/2013  . Benign neoplasm 03/25/2013  .  Dizziness 03/25/2013  . Type 2 diabetes mellitus ([place]) 03/25/2013  . BP (high blood pressure) 03/25/2013  . Awareness of heartbeats 03/25/2013  . Pure hypercholesterolemia 03/25/2013  . Infective tonsillitis 03/25/2013  . Adenomatous polyp 03/25/2013    Past Surgical History:  Procedure Laterality Date  . BACK SURGERY     Kyphoplasty   March 2018  . COLONOSCOPY    . HERNIA REPAIR     left inguinal hernia repair as an infant  . KYPHOPLASTY N/A 02/11/2016   Procedure: KYPHOPLASTY  L2,T9;  Surgeon: Hessie Knows, MD;  Location: [facility] ORS;  Service: Orthopedics;  Laterality: N/A;  . KYPHOPLASTY N/A 03/15/2016   Procedure: KYPHOPLASTY L3;  Surgeon: Hessie Knows, MD;  Location: [facility] ORS;  Service: Orthopedics;  Laterality: N/A;  . skin cancer removed    . TONSILLECTOMY      Prior to Admission medications   Medication Sig Start Date End Date Taking? Authorizing Provider  carbidopa-levodopa (SINEMET IR) 25-100 MG per tablet Take 1 tablet by mouth 3 (three) times daily.    [provider]  cyanocobalamin 1000 MCG tablet Take 1,000 mcg by mouth daily.    [provider]  gabapentin (NEURONTIN) 300 MG capsule Take by mouth. 04/04/16 04/04/17  [provider]  hydrochlorothiazide (HYDRODIURIL) 25 MG tablet Take 25 mg by mouth daily.    [provider]  ibuprofen (ADVIL,MOTRIN) 200 MG tablet Take 200 mg by mouth every 6 (six) hours as  needed.    [provider]  lisinopril (PRINIVIL,ZESTRIL) 20 MG tablet Take 20 mg by mouth 2 (two) times daily.    [provider]  sertraline (ZOLOFT) 50 MG tablet Take 1 tablet (50 mg total) by mouth daily. 08/08/16   Rainey Pines, MD  simvastatin (ZOCOR) 20 MG tablet Take 20 mg by mouth daily. Reported on 04/14/2015    [provider]  Vitamin D, Ergocalciferol, (DRISDOL) 50000 UNITS CAPS capsule Take 50,000 Units by mouth every 7 (seven) days.    [provider]    Allergies Amantadine  Family  History  Problem Relation Age of Onset  . Heart Problems Mother   . Dementia Mother     Social History Social History  Substance Use Topics  . Smoking status: Never Smoker  . Smokeless tobacco: Never Used  . Alcohol use 1.2 oz/week    2 Cans of beer per week     Comment: social    Review of Systems Constitutional: No fever/chills Eyes: No visual changes. ENT: No sore throat. No stiff neck no neck pain Cardiovascular: Denies chest pain. Respiratory: Denies shortness of breath. Gastrointestinal:   no vomiting.  No diarrhea.  No constipation. Genitourinary: Negative for dysuria. Musculoskeletal: Negative lower extremity swelling Skin: Negative for rash. Neurological: Negative for severe headaches, focal weakness or numbness.   ____________________________________________   PHYSICAL EXAM:  VITAL SIGNS: ED Triage Vitals  Enc Vitals Group     BP 09/20/16 0848 (!) 151/98     Pulse Rate 09/20/16 0848 (!) 107     Resp 09/20/16 0848 (!) 28     Temp 09/20/16 0848 (!) 100.5 F (38.1 C)     Temp Source 09/20/16 0848 Oral     SpO2 09/20/16 0848 95 %     Weight 09/20/16 0834 150 lb (68 kg)     Height 09/20/16 0834 5\' 5"  (1.651 m)     Head Circumference --      Peak Flow --      Pain Score --      Pain Loc --      Pain Edu? --      Excl. in [place]? --     Constitutional: Alert and oriented. Patient does not look like he is baseline well but he does not appear toxic at this moment Eyes: Conjunctivae are normal Head: Atraumatic HEENT: No congestion/rhinnorhea. Mucous membranes are moist.  Oropharynx non-erythematous Neck:   Nontender with no meningismus, no masses, no stridor Cardiovascular: Normal rate, regular rhythm. Grossly normal heart sounds.  Good peripheral circulation. Respiratory: Normal respiratory effort.  No retractions. Lungs CTAB. Abdominal: Soft and nontender. No distention. No guarding no rebound Back:  There is no focal tenderness or step off.  there is no  midline tenderness there are no lesions noted. there is no CVA tenderness  Musculoskeletal: No lower extremity tenderness, no upper extremity tenderness. No joint effusions, no DVT signs strong distal pulses no edema Neurologic:  Normal speech and language. No gross focal neurologic deficits are appreciated. There is a diffuse tremor, unclear if this is from Parkinson's or withdrawal Skin:  Skin is warm, dry and intact. No rash noted. Psychiatric: Mood and affect are normal. Speech and behavior are normal.  ____________________________________________   LABS (all labs ordered are listed, but only abnormal results are displayed)  Labs Reviewed  CBC - Abnormal; Notable for the following:       Result Value   WBC 13.3 (*)    RDW 20.2 (*)  All other components within normal limits  CULTURE, BLOOD (ROUTINE X 2)  CULTURE, BLOOD (ROUTINE X 2)  URINE CULTURE  COMPREHENSIVE METABOLIC PANEL  PROTIME-INR  URINALYSIS, COMPLETE (UACMP) WITH MICROSCOPIC  TYPE AND SCREEN   ____________________________________________  EKG  I personally interpreted any EKGs ordered by me or triage Baseline limits interpretation, possibly atrial fibrillation rate 108 no acute ischemia ____________________________________________  RADIOLOGY  I reviewed any imaging ordered by me or triage that were performed during my shift and, if possible, patient and/or family made aware of any abnormal findings. ____________________________________________   PROCEDURES  Procedure(s) performed: None  Procedures  Critical Care performed: CRITICAL CARE Performed by: Schuyler Amor   Total critical care time: 48 minutes  Critical care time was exclusive of separately billable procedures and treating other patients.  Critical care was necessary to treat or prevent imminent or life-threatening deterioration.  Critical care was time spent personally by me on the following activities: development of treatment plan  with patient and/or surrogate as well as nursing, discussions with consultants, evaluation of patient's response to treatment, examination of patient, obtaining history from patient or surrogate, ordering and performing treatments and interventions, ordering and review of laboratory studies, ordering and review of radiographic studies, pulse oximetry and re-evaluation of patient's condition.   ____________________________________________   INITIAL IMPRESSION / ASSESSMENT AND PLAN / ED COURSE  Pertinent labs & imaging results that were available during my care of the patient were reviewed by me and considered in my medical decision making (see chart for details).  Patient with no known history of varices presents today with hematemesis in the context of known new diagnosed cirrhosis. Not actively vomiting here. He is tremulous. Concern would be an alcohol withdrawal. We'll give him Ativan, Protonix, and ceftriaxone, we'll hold on octreotide pending further evaluation. Hemoglobin is reassuring fortunately. Will require admission and further evaluation from GI.    ____________________________________________   FINAL CLINICAL IMPRESSION(S) / ED DIAGNOSES  Final diagnoses:  None      This chart was dictated using voice recognition software.  Despite best efforts to proofread,  errors can occur which can change meaning.      Schuyler Amor, MD 09/20/16 3532    Schuyler Amor, MD 09/20/16 [phone]

## 2016-09-21 ENCOUNTER — Inpatient Hospital Stay: Payer: Federal, State, Local not specified - PPO | Admitting: Certified Registered"

## 2016-09-21 ENCOUNTER — Encounter
Admission: EM | Disposition: A | Discharge status: Home / Self Care | Payer: Self-pay | Source: Home / Self Care | Attending: Internal Medicine

## 2016-09-21 DIAGNOSIS — K209 Esophagitis, unspecified: Secondary | ICD-10-CM

## 2016-09-21 DIAGNOSIS — K259 Gastric ulcer, unspecified as acute or chronic, without hemorrhage or perforation: Principal | ICD-10-CM

## 2016-09-21 HISTORY — PX: ESOPHAGOGASTRODUODENOSCOPY (EGD) WITH PROPOFOL: SHX5813

## 2016-09-21 LAB — COMPREHENSIVE METABOLIC PANEL
ALBUMIN: 2.6 g/dL — AB (ref 3.5–5.0)
ALK PHOS: 123 U/L (ref 38–126)
AST: 31 U/L (ref 15–41)
Anion gap: 6 (ref 5–15)
BILIRUBIN TOTAL: 1.4 mg/dL — AB (ref 0.3–1.2)
CALCIUM: 8.2 mg/dL — AB (ref 8.9–10.3)
CO2: 26 mmol/L (ref 22–32)
CREATININE: 0.49 mg/dL — AB (ref 0.61–1.24)
Chloride: 107 mmol/L (ref 101–111)
GFR calc Af Amer: >60 mL/min (ref >60)
GFR calc non Af Amer: >60 mL/min (ref >60)
GLUCOSE: 102 mg/dL — AB (ref 65–99)
Potassium: 4.1 mmol/L (ref 3.5–5.1)
Sodium: 139 mmol/L (ref 135–145)
TOTAL PROTEIN: 4.8 g/dL — AB (ref 6.5–8.1)

## 2016-09-21 LAB — BLOOD CULTURE ID PANEL (REFLEXED)
ACINETOBACTER BAUMANNII: NOT DETECTED
CANDIDA PARAPSILOSIS: NOT DETECTED
Candida albicans: NOT DETECTED
Candida glabrata: NOT DETECTED
Candida krusei: NOT DETECTED
Candida tropicalis: NOT DETECTED
ENTEROCOCCUS SPECIES: NOT DETECTED
Enterobacter cloacae complex: NOT DETECTED
Enterobacteriaceae species: NOT DETECTED
Escherichia coli: NOT DETECTED
HAEMOPHILUS INFLUENZAE: NOT DETECTED
Klebsiella oxytoca: NOT DETECTED
Klebsiella pneumoniae: NOT DETECTED
LISTERIA MONOCYTOGENES: NOT DETECTED
METHICILLIN RESISTANCE: DETECTED — AB
Neisseria meningitidis: NOT DETECTED
PROTEUS SPECIES: NOT DETECTED
PSEUDOMONAS AERUGINOSA: NOT DETECTED
SERRATIA MARCESCENS: NOT DETECTED
STAPHYLOCOCCUS AUREUS BCID: NOT DETECTED
STAPHYLOCOCCUS SPECIES: DETECTED — AB
STREPTOCOCCUS AGALACTIAE: NOT DETECTED
STREPTOCOCCUS PNEUMONIAE: NOT DETECTED
Streptococcus pyogenes: NOT DETECTED
Streptococcus species: NOT DETECTED

## 2016-09-21 LAB — CBC
HCT: 38.9 % — ABNORMAL LOW (ref 40.0–52.0)
Hemoglobin: 13.1 g/dL (ref 13.0–18.0)
MCH: 34.8 pg — ABNORMAL HIGH (ref 26.0–34.0)
MCHC: 33.6 g/dL (ref 32.0–36.0)
MCV: 103.6 fL — ABNORMAL HIGH (ref 80.0–100.0)
Platelets: 111 10*3/uL — ABNORMAL LOW (ref 150–440)
RBC: 3.75 MIL/uL — ABNORMAL LOW (ref 4.40–5.90)
RDW: 20.6 % — AB (ref 11.5–14.5)
WBC: 6.1 10*3/uL (ref 3.8–10.6)

## 2016-09-21 LAB — URINE CULTURE: Culture: NO GROWTH

## 2016-09-21 LAB — HIV ANTIBODY (ROUTINE TESTING W REFLEX): HIV SCREEN 4TH GENERATION: NONREACTIVE

## 2016-09-21 SURGERY — ESOPHAGOGASTRODUODENOSCOPY (EGD) WITH PROPOFOL
Anesthesia: General

## 2016-09-21 MED ORDER — IPRATROPIUM-ALBUTEROL 0.5-2.5 (3) MG/3ML IN SOLN
3.0000 mL | Freq: Four times a day (QID) | RESPIRATORY_TRACT | Status: DC
  Administered 2016-09-21: 20:00:00 3 mL via RESPIRATORY_TRACT
  Filled 2016-09-21 (×3): qty 3

## 2016-09-21 MED ORDER — VANCOMYCIN HCL IN DEXTROSE 1-5 GM/200ML-% IV SOLN
1000.0000 mg | Freq: Two times a day (BID) | INTRAVENOUS | Status: DC
  Administered 2016-09-21: 1000 mg via INTRAVENOUS
  Filled 2016-09-21 (×3): qty 200

## 2016-09-21 MED ORDER — PROPOFOL 500 MG/50ML IV EMUL
INTRAVENOUS | Status: DC | PRN
  Administered 2016-09-21: 150 ug/kg/min via INTRAVENOUS

## 2016-09-21 MED ORDER — LIDOCAINE HCL (PF) 2 % IJ SOLN
INTRAMUSCULAR | Status: AC
  Filled 2016-09-21: qty 2

## 2016-09-21 MED ORDER — SUCRALFATE 1 GM/10ML PO SUSP
1.0000 g | Freq: Four times a day (QID) | ORAL | Status: DC
  Administered 2016-09-21 – 2016-09-22 (×3): 1 g via ORAL
  Filled 2016-09-21 (×3): qty 10

## 2016-09-21 MED ORDER — MIDAZOLAM HCL 2 MG/2ML IJ SOLN
INTRAMUSCULAR | Status: AC
  Filled 2016-09-21: qty 2

## 2016-09-21 MED ORDER — VANCOMYCIN HCL IN DEXTROSE 1-5 GM/200ML-% IV SOLN
1000.0000 mg | Freq: Once | INTRAVENOUS | Status: AC
  Administered 2016-09-21: 1000 mg via INTRAVENOUS
  Filled 2016-09-21: qty 200

## 2016-09-21 MED ORDER — PANTOPRAZOLE SODIUM 40 MG PO TBEC
40.0000 mg | DELAYED_RELEASE_TABLET | Freq: Two times a day (BID) | ORAL | Status: DC
  Administered 2016-09-21 – 2016-09-22 (×2): 40 mg via ORAL
  Filled 2016-09-21 (×2): qty 1

## 2016-09-21 MED ORDER — PROPOFOL 10 MG/ML IV BOLUS
INTRAVENOUS | Status: DC | PRN
  Administered 2016-09-21: 70 mg via INTRAVENOUS

## 2016-09-21 MED ORDER — SODIUM CHLORIDE 0.9 % IV SOLN: INTRAVENOUS | Status: DC

## 2016-09-21 MED ORDER — NYSTATIN 100000 UNIT/ML MT SUSP
5.0000 mL | Freq: Four times a day (QID) | OROMUCOSAL | Status: DC
  Administered 2016-09-21 – 2016-09-22 (×4): 500000 [IU] via ORAL
  Filled 2016-09-21 (×4): qty 5

## 2016-09-21 NOTE — Progress Notes (Signed)
PHARMACY - PHYSICIAN COMMUNICATION CRITICAL VALUE ALERT - BLOOD CULTURE IDENTIFICATION (BCID)  Results for orders placed or performed during the hospital encounter of 09/20/16  Blood Culture ID Panel (Reflexed) (Collected: 09/20/2016 10:11 AM)  Result Value Ref Range   Enterococcus species NOT DETECTED NOT DETECTED   Listeria monocytogenes NOT DETECTED NOT DETECTED   Staphylococcus species DETECTED (A) NOT DETECTED   Staphylococcus aureus NOT DETECTED NOT DETECTED   Methicillin resistance DETECTED (A) NOT DETECTED   Streptococcus species NOT DETECTED NOT DETECTED   Streptococcus agalactiae NOT DETECTED NOT DETECTED   Streptococcus pneumoniae NOT DETECTED NOT DETECTED   Streptococcus pyogenes NOT DETECTED NOT DETECTED   Acinetobacter baumannii NOT DETECTED NOT DETECTED   Enterobacteriaceae species NOT DETECTED NOT DETECTED   Enterobacter cloacae complex NOT DETECTED NOT DETECTED   Escherichia coli NOT DETECTED NOT DETECTED   Klebsiella oxytoca NOT DETECTED NOT DETECTED   Klebsiella pneumoniae NOT DETECTED NOT DETECTED   Proteus species NOT DETECTED NOT DETECTED   Serratia marcescens NOT DETECTED NOT DETECTED   Haemophilus influenzae NOT DETECTED NOT DETECTED   Neisseria meningitidis NOT DETECTED NOT DETECTED   Pseudomonas aeruginosa NOT DETECTED NOT DETECTED   Candida albicans NOT DETECTED NOT DETECTED   Candida glabrata NOT DETECTED NOT DETECTED   Candida krusei NOT DETECTED NOT DETECTED   Candida parapsilosis NOT DETECTED NOT DETECTED   Candida tropicalis NOT DETECTED NOT DETECTED    Name of physician (or Provider) Contacted: Dr. Manuella Ghazi  Changes to prescribed antibiotics required: Per MD, will initiate vancomycin.   Patient received vancomycin 1000 mg dose followed by vancomycin 1000 mg IV q12h. Goal VT 15-20 mcg/mL VT ordered prior to 5th dose which should represent steady state  Lenis Noon, PharmD Clinical Pharmacist 09/21/2016  7:16 PM

## 2016-09-21 NOTE — Transfer of Care (Signed)
Immediate Anesthesia Transfer of Care Note  Patient: Charles Stanton  Procedure(s) Performed: Procedure(s): ESOPHAGOGASTRODUODENOSCOPY (EGD) WITH PROPOFOL (N/A)  Patient Location: PACU  Anesthesia Type:General  Level of Consciousness: sedated  Airway & Oxygen Therapy: Patient Spontanous Breathing and Patient connected to nasal cannula oxygen  Post-op Assessment: Report given to RN and Post -op Vital signs reviewed and stable  Post vital signs: Reviewed and stable  Last Vitals:  Vitals:   09/21/16 1142 09/21/16 1143  BP:  (!) 114/53  Pulse:  60  Resp:  13  Temp: 37.2 C   SpO2:  95%    Last Pain:  Vitals:   09/21/16 1142  TempSrc: Tympanic         Complications: No apparent anesthesia complications

## 2016-09-21 NOTE — Anesthesia Postprocedure Evaluation (Signed)
Anesthesia Post Note  Patient: Charles Stanton  Procedure(s) Performed: Procedure(s) (LRB): ESOPHAGOGASTRODUODENOSCOPY (EGD) WITH PROPOFOL (N/A)  Patient location during evaluation: PACU Anesthesia Type: General Level of consciousness: awake and alert Pain management: pain level controlled Vital Signs Assessment: post-procedure vital signs reviewed and stable Respiratory status: spontaneous breathing, nonlabored ventilation, respiratory function stable and patient connected to nasal cannula oxygen Cardiovascular status: blood pressure returned to baseline and stable Postop Assessment: no apparent nausea or vomiting Anesthetic complications: no     Last Vitals:  Vitals:   09/21/16 1212 09/21/16 1239  BP:  136/80  Pulse:  (!) 53  Resp: 16 18  Temp:  36.6 C  SpO2:  95%    Last Pain:  Vitals:   09/21/16 1239  TempSrc: Oral                 Molli Barrows

## 2016-09-21 NOTE — H&P (Signed)
Charles Bellows MD [address] [place], [place] [address] Phone: [phone] Fax : [phone]  Primary Care Physician:  [name], MD Primary Gastroenterologist:  Dr. Jonathon Stanton   Pre-Procedure History & Physical: HPI:  Charles Stanton is a 61 y.o. male is here for an endoscopy.   Past Medical History:  Diagnosis Date  . Abnormal liver function   . Anemia   . Anxiety   . Colonic mass   . Depression   . Dizziness   . Dry cough   . History of colon polyps   . Hypercholesteremia   . Hyperlipemia   . Hypertension   . Palpitations   . Parkinson's disease ([place])   . Parkinson's disease ([place])   . PVD (peripheral vascular disease) ([place])   . Tremors of nervous system     Past Surgical History:  Procedure Laterality Date  . BACK SURGERY     Kyphoplasty   March 2018  . COLONOSCOPY    . HERNIA REPAIR     left inguinal hernia repair as an infant  . KYPHOPLASTY N/A 02/11/2016   Procedure: KYPHOPLASTY  L2,T9;  Surgeon: Hessie Knows, MD;  Location: [facility] ORS;  Service: Orthopedics;  Laterality: N/A;  . KYPHOPLASTY N/A 03/15/2016   Procedure: KYPHOPLASTY L3;  Surgeon: Hessie Knows, MD;  Location: [facility] ORS;  Service: Orthopedics;  Laterality: N/A;  . skin cancer removed    . TONSILLECTOMY      Prior to Admission medications   Medication Sig Start Date End Date Taking? Authorizing Provider  aspirin EC 81 MG tablet Take 81 mg by mouth daily.   Yes [provider]  carbidopa-levodopa (SINEMET CR) 50-200 MG tablet Take 1 tablet by mouth at bedtime. 08/14/16  Yes [provider]  cyanocobalamin 1000 MCG tablet Take 1,000 mcg by mouth daily.   Yes [provider]  gabapentin (NEURONTIN) 300 MG capsule Take 300 mg by mouth 3 (three) times daily.  04/04/16 04/04/17 Yes [provider]  ibuprofen (ADVIL,MOTRIN) 200 MG tablet Take 200 mg by mouth every 6 (six) hours as needed.   Yes [provider]  lisinopril (PRINIVIL,ZESTRIL) 20 MG  tablet Take 20 mg by mouth 2 (two) times daily.   Yes [provider]  potassium chloride SA (K-DUR,KLOR-CON) 20 MEQ tablet Take 20 mEq by mouth 2 (two) times daily. 06/29/16  Yes [provider]  sertraline (ZOLOFT) 50 MG tablet Take 1 tablet (50 mg total) by mouth daily. 08/08/16  Yes Rainey Pines, MD  simvastatin (ZOCOR) 20 MG tablet Take 20 mg by mouth at bedtime. Reported on 04/14/2015   Yes [provider]    Allergies as of 09/20/2016 - Review Complete 09/20/2016  Allergen Reaction Noted  . Amantadine Other (See Comments) 05/06/2015    Family History  Problem Relation Age of Onset  . Heart Problems Mother   . Dementia Mother     Social History   Social History  . Marital status: Married    Spouse name: N/A  . Number of children: N/A  . Years of education: N/A   Occupational History  . Not on file.   Social History Main Topics  . Smoking status: Never Smoker  . Smokeless tobacco: Never Used  . Alcohol use 1.2 oz/week    2 Cans of beer per week     Comment: social  . Drug use: No  . Sexual activity: Yes   Other Topics Concern  . Not on file   Social  History Narrative  . No narrative on file    Review of Systems: See HPI, otherwise negative ROS  Physical Exam: BP (!) 167/61   Pulse (!) 50   Temp (!) 97 F (36.1 C) (Tympanic)   Resp 16   Ht 5\' 5"  (1.651 m)   Wt 147 lb 3 oz (66.8 kg)   SpO2 95%   BMI 24.49 kg/m  General:   Alert,  pleasant and cooperative in NAD Head:  Normocephalic and atraumatic. Neck:  Supple; no masses or thyromegaly. Lungs:  Clear throughout to auscultation.    Heart:  Regular rate and rhythm. Abdomen:  Soft, nontender and nondistended. Normal bowel sounds, without guarding, and without rebound.   Neurologic:  Alert and  oriented x4;  grossly normal neurologically.  Impression/Plan: Charles Stanton is here for an endoscopy to be performed for hematemesis  Risks, benefits, limitations, and alternatives  regarding  endoscopy have been reviewed with the patient.  Questions have been answered.  All parties agreeable.   Charles Bellows, MD  09/21/2016, 11:19 AM

## 2016-09-21 NOTE — Anesthesia Post-op Follow-up Note (Signed)
Anesthesia QCDR form completed.        

## 2016-09-21 NOTE — Anesthesia Preprocedure Evaluation (Addendum)
Anesthesia Evaluation  Patient identified by MRN, date of birth, ID band Patient awake    Reviewed: Allergy & Precautions, H&P , NPO status , Patient's Chart, lab work & pertinent test results, reviewed documented beta blocker date and time   Airway Mallampati: II   Neck ROM: full    Dental  (+) Poor Dentition   Pulmonary neg pulmonary ROS,    Pulmonary exam normal        Cardiovascular Exercise Tolerance: Poor hypertension, On Medications + Peripheral Vascular Disease  negative cardio ROS Normal cardiovascular examAtrial Fibrillation  Rhythm:regular Rate:Normal     Neuro/Psych PSYCHIATRIC DISORDERS negative neurological ROS  negative psych ROS   GI/Hepatic negative GI ROS, Neg liver ROS,   Endo/Other  negative endocrine ROSdiabetes, Well Controlled, Type 2, Oral Hypoglycemic Agents  Renal/GU negative Renal ROS  negative genitourinary   Musculoskeletal   Abdominal   Peds  Hematology negative hematology ROS (+) anemia ,   Anesthesia Other Findings Past Medical History: No date: Abnormal liver function No date: Anemia No date: Anxiety No date: Colonic mass No date: Depression No date: Dizziness No date: Dry cough No date: History of colon polyps No date: Hypercholesteremia No date: Hyperlipemia No date: Hypertension No date: Palpitations No date: Parkinson's disease ([place]) No date: Parkinson's disease ([place]) No date: PVD (peripheral vascular disease) ([place]) No date: Tremors of nervous system Past Surgical History: No date: BACK SURGERY     Comment:  Kyphoplasty   March 2018 No date: COLONOSCOPY No date: HERNIA REPAIR     Comment:  left inguinal hernia repair as an infant 02/11/2016: KYPHOPLASTY; N/A     Comment:  Procedure: KYPHOPLASTY  L2,T9;  Surgeon: Hessie Knows,               MD;  Location: [facility] ORS;  Service: Orthopedics;                Laterality: N/A; 03/15/2016: KYPHOPLASTY; N/A     Comment:   Procedure: KYPHOPLASTY L3;  Surgeon: Hessie Knows, MD;                Location: [facility] ORS;  Service: Orthopedics;  Laterality:               N/A; No date: skin cancer removed No date: TONSILLECTOMY BMI    Body Mass Index:  24.49 kg/m     Reproductive/Obstetrics negative OB ROS                            Anesthesia Physical Anesthesia Plan  ASA: III and emergent  Anesthesia Plan: General   Post-op Pain Management:    Induction:   PONV Risk Score and Plan: 3 and Ondansetron, Dexamethasone, Midazolam and Propofol infusion  Airway Management Planned:   Additional Equipment:   Intra-op Plan:   Post-operative Plan:   Informed Consent: I have reviewed the patients History and Physical, chart, labs and discussed the procedure including the risks, benefits and alternatives for the proposed anesthesia with the patient or authorized representative who has indicated his/her understanding and acceptance.   Dental Advisory Given  Plan Discussed with: CRNA  Anesthesia Plan Comments:        Anesthesia Quick Evaluation

## 2016-09-21 NOTE — Therapy (Signed)
Called to patient's room post procedure.  Patient with rhonci in the upper airways anteriorly and rhonci and wheezes posteriorly in the upper and middle lobes.  Lower lobes with crackles.  PRN treatment given with increased aeration post treatment.  Pt with a Tiburones.  Txs ordered Q6 with duoneb.  Pt in no distress.

## 2016-09-21 NOTE — Op Note (Signed)
[facility] Gastroenterology Patient Name: Charles Stanton Procedure Date: 09/21/2016 11:23 AM MRN: [id number] Account #: [id number] Date of Birth: Jul 31, 1955 Admit Type: Inpatient Age: 61 Room: [facility] ENDO ROOM 4 Gender: Male Note Status: Finalized Procedure:            Upper GI endoscopy Indications:          Hematemesis Providers:            Jonathon Bellows MD, MD Referring MD:         [name], MD (Referring MD) Medicines:            Monitored Anesthesia Care Complications:        No immediate complications. Procedure:            Pre-Anesthesia Assessment:                       - Prior to the procedure, a History and Physical was                        performed, and patient medications, allergies and                        sensitivities were reviewed. The patient's tolerance of                        previous anesthesia was reviewed.                       - The risks and benefits of the procedure and the                        sedation options and risks were discussed with the                        patient. All questions were answered and informed                        consent was obtained.                       - ASA Grade Assessment: III - A patient with severe                        systemic disease.                       After obtaining informed consent, the endoscope was                        passed under direct vision. Throughout the procedure,                        the patient's blood pressure, pulse, and oxygen                        saturations were monitored continuously. The Endoscope                        was introduced through the mouth, and advanced to the  third part of duodenum. The upper GI endoscopy was                        accomplished with ease. The patient tolerated the                        procedure well. Findings:      The examined duodenum was normal.      One non-bleeding superficial gastric ulcer with a  clean ulcer base       (Forrest Class III) was found in the gastric antrum. The lesion was 6 mm       in largest dimension.      A large hiatal hernia was present.      LA Grade C (one or more mucosal breaks continuous between tops of 2 or       more mucosal folds, less than 75% circumference) esophagitis with no       bleeding was found 30 to 40 cm from the incisors.      Localized moderate erythema was found in the upper third of the       esophagus. Impression:           - Normal examined duodenum.                       - Non-bleeding gastric ulcer with a clean ulcer base                        (Forrest Class III).                       - Large hiatal hernia.                       - LA Grade C reflux esophagitis.                       - Erythema in the upper third of the esophagus.                       - No specimens collected. Recommendation:       - Return patient to hospital ward for ongoing care.                       - 1. Omeprazole 40mg  BID for 6 weeks                       2. Repeat EGD in 6 weeks to check for healing of the                        esophagitis and to r/o barrettes- no varices seen . At                        the same time will also check for healing of the                        gastric ulcer                       3. Check H pylori stool antigen  4. Start on clears today and advance to soft diet                        tomorrow - severe edema of the esophagus in the lower                        third                       5. Carafate QID Procedure Code(s):    --- Professional ---                       [phone], Esophagogastroduodenoscopy, flexible, transoral;                        diagnostic, including collection of specimen(s) by                        brushing or washing, when performed (separate procedure) Diagnosis Code(s):    --- Professional ---                       K25.9, Gastric ulcer, unspecified as acute or chronic,                         without hemorrhage or perforation                       K44.9, Diaphragmatic hernia without obstruction or                        gangrene                       K21.0, Gastro-esophageal reflux disease with esophagitis                       K22.8, Other specified diseases of esophagus                       K92.0, Hematemesis CPT copyright 2016 American Medical Association. All rights reserved. The codes documented in this report are preliminary and upon coder review may  be revised to meet current compliance requirements. Jonathon Bellows, MD Jonathon Bellows MD, MD 09/21/2016 11:44:15 AM This report has been signed electronically. Number of Addenda: 0 Note Initiated On: 09/21/2016 11:23 AM      [facility]

## 2016-09-21 NOTE — Evaluation (Signed)
Physical Therapy Evaluation Patient Details Name: Charles Stanton MRN: [id number] DOB: Sep 21, 1955 Today's Date: 09/21/2016   History of Present Illness  presented to ER secondary to coffee-ground emesis; admitted secondary to GIB.  Status post EGD this date, possible aspiration episode.  Clinical Impression  Upon evaluation, patient alert and oriented; follows all commands and demonstrates good effort with all therapeutic tasks.  Mild R UE/LE weakness with moderate tremor (related to Parkinson's, unchanged with this admission).  Currently requiring cga/min assist for sit/stand, basic transfers and gait (220') with RW.  R knee flexion throughout gait cycle with excessive bilat toe-in, intermittent scissoring during gait trial. Positive rhomberg test, indicative of static and dynamic balance deficits. Do recommend continued use of RW at this time; patient/wife informed/aware. Would benefit from skilled PT to address above deficits and promote optimal return to PLOF; .recommend transition to home with outpatient PT follow up upon discharge.    Follow Up Recommendations Outpatient PT    Equipment Recommendations  Rolling walker with 5" wheels    Recommendations for Other Services       Precautions / Restrictions Precautions Precautions: Fall Restrictions Weight Bearing Restrictions: No      Mobility  Bed Mobility Overal bed mobility: Modified Independent                Transfers Overall transfer level: Needs assistance Equipment used: Rolling walker (2 wheeled) Transfers: Sit to/from Stand Sit to Stand: Min guard            Ambulation/Gait Ambulation/Gait assistance: Min guard;Min assist Ambulation Distance (Feet): 220 Feet         General Gait Details: reciprocal stepping pattern with bilat toe-in, intermittent scissoring with fatigue/divided attention; R LE with sustained knee flexion throughout all loading phases of gait (but not buckling). Min cuing for walker  position  Stairs            Wheelchair Mobility    Modified Rankin (Stroke Patients Only)       Balance Overall balance assessment: Needs assistance Sitting-balance support: No upper extremity supported;Feet supported Sitting balance-Leahy Scale: Good     Standing balance support: Bilateral upper extremity supported Standing balance-Leahy Scale: Fair                 High Level Balance Comments: positive rhomberg, min assist to maintain balance with feet together, eyes closed due to R lateral LOB             Pertinent Vitals/Pain Pain Assessment: No/denies pain    Home Living Family/patient expects to be discharged to:: Private residence Living Arrangements: Spouse/significant other Available Help at Discharge: Family Type of Home: House   Entrance Stairs-Rails: Right;Left;Can reach both   Home Layout: Multi-level        Prior Function Level of Independence: Independent with assistive device(s)         Comments: Ambulatory for household distances without assist device, SPC outside of home, RW for longer, community distances.  Does endorse 4-5 falls within previous six months     Hand Dominance        Extremity/Trunk Assessment   Upper Extremity Assessment Upper Extremity Assessment:  (R UE grossly 4-/5, L LE 5/5)    Lower Extremity Assessment Lower Extremity Assessment: Generalized weakness (R LE grossly 4-/5, L LE 5/5)       Communication   Communication: No difficulties  Cognition Arousal/Alertness: Awake/alert Behavior During Therapy: WFL for tasks assessed/performed Overall Cognitive Status: Within Functional Limits for tasks assessed  General Comments      Exercises     Assessment/Plan    PT Assessment Patient needs continued PT services  PT Problem List Decreased strength;Decreased activity tolerance;Decreased balance;Decreased mobility;Decreased  coordination;Decreased knowledge of use of DME;Decreased safety awareness;Decreased knowledge of precautions       PT Treatment Interventions DME instruction;Gait training;Stair training;Functional mobility training;Therapeutic activities;Therapeutic exercise;Balance training;Neuromuscular re-education;Patient/family education    PT Goals (Current goals can be found in the Care Plan section)  Acute Rehab PT Goals Patient Stated Goal: to return home; to use RW (per patient, wife prefers patient use SPC) PT Goal Formulation: With patient Time For Goal Achievement: 10/05/16 Potential to Achieve Goals: Good    Frequency Min 2X/week   Barriers to discharge        Co-evaluation               AM-PAC PT "6 Clicks" Daily Activity  Outcome Measure Difficulty turning over in bed (including adjusting bedclothes, sheets and blankets)?: None Difficulty moving from lying on back to sitting on the side of the bed? : None Difficulty sitting down on and standing up from a chair with arms (e.g., wheelchair, bedside commode, etc,.)?: Unable Help needed moving to and from a bed to chair (including a wheelchair)?: A Little Help needed walking in hospital room?: A Little Help needed climbing 3-5 steps with a railing? : A Little 6 Click Score: 18    End of Session Equipment Utilized During Treatment: Gait belt Activity Tolerance: Patient tolerated treatment well Patient left: in bed;with call bell/phone within reach;with bed alarm set;with family/visitor present Nurse Communication: Mobility status PT Visit Diagnosis: Difficulty in walking, not elsewhere classified (R26.2);Muscle weakness (generalized) (M62.81)    Time: 2458-0998 PT Time Calculation (min) (ACUTE ONLY): 35 min   Charges:   PT Evaluation $PT Eval Low Complexity: 1 Low PT Treatments $Therapeutic Activity: 8-22 mins   PT G Codes:   PT G-Codes **NOT FOR INPATIENT CLASS** Functional Assessment Tool Used: AM-PAC 6 Clicks  Basic Mobility Functional Limitation: Mobility: Walking and moving around Mobility: Walking and Moving Around Current Status (P3825): At least 20 percent but less than 40 percent impaired, limited or restricted Mobility: Walking and Moving Around Goal Status [phone]): At least 1 percent but less than 20 percent impaired, limited or restricted    Lorali Khamis H. Owens Shark, PT, DPT, NCS 09/21/16, 9:07 PM [phone]

## 2016-09-21 NOTE — Progress Notes (Signed)
[place] [facility] [place] NAME: Charles Stanton    MR#:  [id number]  DATE OF BIRTH:  July 02, 1955  SUBJECTIVE:  CHIEF COMPLAINT:   Chief Complaint  Patient presents with  . Emesis  Nursing called postprocedure as he had some trouble breathing and crackles, when I evaluated he already received one breathing treatment and seems very comfortable he might have had aspiration REVIEW OF SYSTEMS:  Review of Systems  Constitutional: Negative for chills, fever and weight loss.  HENT: Negative for nosebleeds and sore throat.   Eyes: Negative for blurred vision.  Respiratory: Positive for shortness of breath. Negative for cough and wheezing.   Cardiovascular: Negative for chest pain, orthopnea, leg swelling and PND.  Gastrointestinal: Negative for abdominal pain, constipation, diarrhea, heartburn, nausea and vomiting.  Genitourinary: Negative for dysuria and urgency.  Musculoskeletal: Negative for back pain.  Skin: Negative for rash.  Neurological: Negative for dizziness, speech change, focal weakness and headaches.  Endo/Heme/Allergies: Does not bruise/bleed easily.  Psychiatric/Behavioral: Negative for depression.    DRUG ALLERGIES:   Allergies  Allergen Reactions  . Amantadine Other (See Comments)    fatigue sleepy   VITALS:  Blood pressure 136/80, pulse (!) 53, temperature 97.9 F (36.6 C), temperature source Oral, resp. rate 18, height 5\' 5"  (1.651 m), weight 66.8 kg (147 lb 3 oz), SpO2 92 %. PHYSICAL EXAMINATION:  Physical Exam  Constitutional: He is oriented to person, place, and time and well-developed, well-nourished, and in no distress.  HENT:  Head: Normocephalic and atraumatic.  Eyes: Pupils are equal, round, and reactive to light. Conjunctivae and EOM are normal.  Neck: Normal range of motion. Neck supple. No tracheal deviation present. No thyromegaly present.  Cardiovascular: Normal rate, regular rhythm and normal heart  sounds.   Pulmonary/Chest: Effort normal and breath sounds normal. No respiratory distress. He has no wheezes. He exhibits no tenderness.  Abdominal: Soft. Bowel sounds are normal. He exhibits no distension. There is no tenderness.  Musculoskeletal: Normal range of motion.  Neurological: He is alert and oriented to person, place, and time. No cranial nerve deficit.  Skin: Skin is warm and dry. No rash noted.  Psychiatric: Mood and affect normal.   LABORATORY PANEL:  Male CBC  Recent Labs Lab 09/21/16 0359  WBC 6.1  HGB 13.1  HCT 38.9*  PLT 111*   ------------------------------------------------------------------------------------------------------------------ Chemistries   Recent Labs Lab 09/21/16 0359  NA 139  K 4.1  CL 107  CO2 26  GLUCOSE 102*  BUN <5*  CREATININE 0.49*  CALCIUM 8.2*  AST 31  ALT <5*  ALKPHOS 123  BILITOT 1.4*   RADIOLOGY:  No results found. ASSESSMENT AND PLAN:   * Upper GI bleed with coffee-ground emesis - s/p EGD showing nonbleeding gastric ulcer with a clean ulcer base.  Large hiatal hernia.  Grade C reflux esophagitis, erythema in the upper third of the esophagus. -Recommend continuing omeprazole 40 mg twice a day for 6 weeks. -Check H pylori started on clear liquids with advancing to soft tomorrow. -Carafate 4 times daily  * Sepsis: Ruled out  * Hypertension. Continue lisinopril  * Alcohol abuse.  As needed CIWA protocol for withdrawals.  * Parkinson's disease. Continue carbidopa.  * DVT prophylaxis with SCDs.                         All the records are reviewed and case discussed  with Care Management/Social Worker. Management plans discussed with the patient, family (wife at bedside) and they are in agreement.  CODE STATUS: Full Code  TOTAL TIME TAKING CARE OF THIS PATIENT: 35 minutes.   More than 50% of the time was spent in counseling/coordination of care: YES  POSSIBLE D/C IN 1 DAYS, DEPENDING ON CLINICAL  CONDITION.   Max Sane M.D on 09/21/2016 at 4:55 PM  Between 7am to 6pm - Pager - [phone]  After 6pm go to [email or web address] - Proofreader  [facility]  Office  [phone]  CC: Primary care physician; [name], MD  Note: This dictation was prepared with Dragon dictation along with smaller phrase technology. Any transcriptional errors that result from this process are unintentional.

## 2016-09-22 ENCOUNTER — Inpatient Hospital Stay: Payer: Federal, State, Local not specified - PPO

## 2016-09-22 ENCOUNTER — Encounter: Payer: Self-pay | Admitting: Gastroenterology

## 2016-09-22 LAB — CBC
HCT: 39.4 % — ABNORMAL LOW (ref 40.0–52.0)
HEMOGLOBIN: 13.4 g/dL (ref 13.0–18.0)
MCH: 34.9 pg — AB (ref 26.0–34.0)
MCHC: 34 g/dL (ref 32.0–36.0)
MCV: 102.9 fL — ABNORMAL HIGH (ref 80.0–100.0)
PLATELETS: 110 10*3/uL — AB (ref 150–440)
RBC: 3.83 MIL/uL — ABNORMAL LOW (ref 4.40–5.90)
RDW: 20.4 % — ABNORMAL HIGH (ref 11.5–14.5)
WBC: 4.4 10*3/uL (ref 3.8–10.6)

## 2016-09-22 LAB — BASIC METABOLIC PANEL
Anion gap: 4 — ABNORMAL LOW (ref 5–15)
CALCIUM: 8.4 mg/dL — AB (ref 8.9–10.3)
CHLORIDE: 107 mmol/L (ref 101–111)
CO2: 30 mmol/L (ref 22–32)
CREATININE: 0.49 mg/dL — AB (ref 0.61–1.24)
Glucose, Bld: 106 mg/dL — ABNORMAL HIGH (ref 65–99)
Potassium: 3.8 mmol/L (ref 3.5–5.1)
SODIUM: 141 mmol/L (ref 135–145)

## 2016-09-22 MED ORDER — PANTOPRAZOLE SODIUM 40 MG PO TBEC: 40.0000 mg | DELAYED_RELEASE_TABLET | Freq: Two times a day (BID) | ORAL | 0 refills | Status: DC

## 2016-09-22 MED ORDER — SUCRALFATE 1 G PO TABS: 1.0000 g | ORAL_TABLET | Freq: Four times a day (QID) | ORAL | 0 refills | Status: DC

## 2016-09-22 NOTE — Plan of Care (Signed)
Problem: Physical Regulation: Goal: Will remain free from infection Outcome: Not Progressing Infectious disease consult pending. Pt on IV Vancomycin. Positive blood cultures.   Problem: Nutrition: Goal: Adequate nutrition will be maintained Outcome: Progressing Diet to upgrade to full liquids this am. Pt tolerating clears. EGD performed yesterday.

## 2016-09-22 NOTE — Progress Notes (Signed)
Physical Therapy Treatment Patient Details Name: Charles Stanton MRN: [id number] DOB: 01-29-1955 Today's Date: 09/22/2016    History of Present Illness presented to ER secondary to coffee-ground emesis; admitted secondary to GIB.  Status post EGD this date, possible aspiration episode.    PT Comments    Decreased tremors noted this date, with associated improvements in balance with all functional activities.  Continues to perform better (and subjectively feel more comfortable) with RW; recommend use with all transfers and gait upon discharge.   Follow Up Recommendations  Outpatient PT     Equipment Recommendations  Rolling walker with 5" wheels    Recommendations for Other Services       Precautions / Restrictions Precautions Precautions: Fall Restrictions Weight Bearing Restrictions: No    Mobility  Bed Mobility Overal bed mobility: Modified Independent                Transfers Overall transfer level: Needs assistance Equipment used: Rolling walker (2 wheeled) Transfers: Sit to/from Stand Sit to Stand: Min guard;Supervision            Ambulation/Gait Ambulation/Gait assistance: Supervision;Min guard Ambulation Distance (Feet): 220 Feet Assistive device: Rolling walker (2 wheeled)       General Gait Details: reciprocal stepping pattern with bilat toe-in, intermittent scissoring with fatigue/divided attention; R LE with sustained knee flexion throughout all loading phases of gait (but not buckling). Min cuing for walker position.  Decreased tremors noted this date   Stairs Stairs: Yes   Stair Management: Two rails Number of Stairs: 6 General stair comments: mixed step to and reciprocal stepping pattern; encouraged for step to pattern for optimal safety  Wheelchair Mobility    Modified Rankin (Stroke Patients Only)       Balance                                            Cognition Arousal/Alertness: Awake/alert Behavior  During Therapy: WFL for tasks assessed/performed Overall Cognitive Status: Within Functional Limits for tasks assessed                                        Exercises      General Comments        Pertinent Vitals/Pain Pain Assessment: No/denies pain    Home Living Family/patient expects to be discharged to:: Private residence Living Arrangements: Spouse/significant other Available Help at Discharge: Family Type of Home: House Home Access: Stairs to enter            Prior Function            PT Goals (current goals can now be found in the care plan section) Acute Rehab PT Goals Patient Stated Goal: to return home; to use RW (per patient, wife prefers patient use SPC) PT Goal Formulation: With patient Time For Goal Achievement: 10/05/16 Potential to Achieve Goals: Good Progress towards PT goals: Progressing toward goals    Frequency           PT Plan Current plan remains appropriate    Co-evaluation              AM-PAC PT "6 Clicks" Daily Activity  Outcome Measure  Difficulty turning over in bed (including adjusting bedclothes, sheets and blankets)?: None Difficulty moving from lying on back  to sitting on the side of the bed? : None Difficulty sitting down on and standing up from a chair with arms (e.g., wheelchair, bedside commode, etc,.)?: Unable Help needed moving to and from a bed to chair (including a wheelchair)?: A Little Help needed walking in hospital room?: A Little Help needed climbing 3-5 steps with a railing? : A Little 6 Click Score: 18    End of Session Equipment Utilized During Treatment: Gait belt Activity Tolerance: Patient tolerated treatment well Patient left: with call bell/phone within reach;with family/visitor present;in chair;with chair alarm set Nurse Communication: Mobility status PT Visit Diagnosis: Difficulty in walking, not elsewhere classified (R26.2);Muscle weakness (generalized) (M62.81)      Time: 3825-0539 PT Time Calculation (min) (ACUTE ONLY): 20 min  Charges:  $Gait Training: 8-22 mins                    G Codes:      Charles Stanton, PT, DPT, NCS 09/22/16, 9:13 PM [phone]

## 2016-09-22 NOTE — Discharge Instructions (Signed)
Peptic Ulcer °A peptic ulcer is a painful sore in the lining of your esophagus, stomach, or the first part of your small intestine. You may have pain in the area between your chest and your belly button. The most common causes of an ulcer are: °· An infection. °· Using certain pain medicines too often or too much. ° °Follow these instructions at home: °· Avoid alcohol. °· Avoid caffeine. °· Do not use any tobacco products. These include cigarettes, chewing tobacco, and e-cigarettes. If you need help quitting, ask your doctor. °· Take over-the-counter and prescription medicines only as told by your doctor. Do not stop or change your medicines unless you talk with your doctor about it first. °· Keep all follow-up visits as told by your doctor. This is important. °Contact a doctor if: °· You do not get better in 7 days after you start treatment. °· You keep having an upset stomach (indigestion) or heartburn. °Get help right away if: °· You have sudden, sharp pain in your belly (abdomen). °· You have lasting belly pain. °· You have bloody poop (stool) or black, tarry poop. °· You throw up (vomit) blood. It may look like coffee grounds. °· You feel light-headed or feel like you may pass out (faint). °· You get weak. °· You get sweaty or feel sticky and cold to the touch (clammy). °This information is not intended to replace advice given to you by your health care provider. Make sure you discuss any questions you have with your health care provider. °Document Released: 03/16/2009 Document Revised: 05/06/2015 Document Reviewed: 09/20/2014 °Elsevier Interactive Patient Education © 2018 [employer] ° °

## 2016-09-22 NOTE — Progress Notes (Signed)
Charles Bellows MD, MRCP(U.K) [address]  [place]  [place], [place] [address]  Main: [phone]    Charles Stanton is being followed for hematemesis  Subjective: No issues with swallowing , no abdominal pain , no shortness of breath, no chest pains. Food going down better and easily . He has had his breakfast and tolerated it well    Objective: Vital signs in last 24 hours: Vitals:   09/21/16 2025 09/21/16 2056 09/22/16 0500 09/22/16 0611  BP:    (!) 147/81  Pulse:    63  Resp:    20  Temp:    98 F (36.7 C)  TempSrc:      SpO2: 94% 94%  95%  Weight:   147 lb 8 oz (66.9 kg)   Height:       Weight change: -2 lb 8 oz (-1.134 kg)  Intake/Output Summary (Last 24 hours) at 09/22/16 1118 Last data filed at 09/22/16 0500  Gross per 24 hour  Intake          1560.83 ml  Output             2550 ml  Net          -989.17 ml     Exam: Heart:: Regular rate and rhythm, S1S2 present or without murmur or extra heart sounds Lungs: normal, clear to auscultation and clear to auscultation and percussion Abdomen: soft, nontender, normal bowel sounds   Lab Results: @LABTEST2 @ Micro Results: Recent Results (from the past 240 hour(s))  Culture, blood (routine x 2)     Status: None (Preliminary result)   Collection Time: 09/20/16 10:11 AM  Result Value Ref Range Status   Specimen Description BLOOD LEFT WRIST  Final   Special Requests   Final    BOTTLES DRAWN AEROBIC AND ANAEROBIC Blood Culture results may not be optimal due to an excessive volume of blood received in culture bottles   Culture  Setup Time   Final    GRAM POSITIVE COCCI IN CLUSTERS AEROBIC BOTTLE ONLY CRITICAL RESULT CALLED TO, READ BACK BY AND VERIFIED WITH: [facility] SWAYNE AT 5188 09/21/2016 BY TFK    Culture GRAM POSITIVE COCCI IN CLUSTERS  Final   Report Status PENDING  Incomplete  Culture, blood (routine x 2)     Status: None (Preliminary result)   Collection Time: 09/20/16 10:11 AM  Result Value Ref Range  Status   Specimen Description BLOOD RIGHT ARM  Final   Special Requests   Final    BOTTLES DRAWN AEROBIC AND ANAEROBIC Blood Culture adequate volume   Culture NO GROWTH 2 DAYS  Final   Report Status PENDING  Incomplete  Urine culture     Status: None   Collection Time: 09/20/16 10:11 AM  Result Value Ref Range Status   Specimen Description URINE, RANDOM  Final   Special Requests NONE  Final   Culture   Final    NO GROWTH Performed at [facility], [address], [place], [place] [address]    Report Status 09/21/2016 FINAL  Final  Blood Culture ID Panel (Reflexed)     Status: Abnormal   Collection Time: 09/20/16 10:11 AM  Result Value Ref Range Status   Enterococcus species NOT DETECTED NOT DETECTED Final   Listeria monocytogenes NOT DETECTED NOT DETECTED Final   Staphylococcus species DETECTED (A) NOT DETECTED Final    Comment: Methicillin (oxacillin) resistant coagulase negative staphylococcus. Possible blood culture contaminant (unless isolated from more  than one blood culture draw or clinical case suggests pathogenicity). No antibiotic treatment is indicated for blood  culture contaminants. CRITICAL RESULT CALLED TO, READ BACK BY AND VERIFIED WITH: MARY SWAYNE AT 3710 09/21/2016 BY TFK    Staphylococcus aureus NOT DETECTED NOT DETECTED Final   Methicillin resistance DETECTED (A) NOT DETECTED Final    Comment: CRITICAL RESULT CALLED TO, READ BACK BY AND VERIFIED WITH: MARY SWAYNE AT 1736 09/21/2016 BY TFK    Streptococcus species NOT DETECTED NOT DETECTED Final   Streptococcus agalactiae NOT DETECTED NOT DETECTED Final   Streptococcus pneumoniae NOT DETECTED NOT DETECTED Final   Streptococcus pyogenes NOT DETECTED NOT DETECTED Final   Acinetobacter baumannii NOT DETECTED NOT DETECTED Final   Enterobacteriaceae species NOT DETECTED NOT DETECTED Final   Enterobacter cloacae complex NOT DETECTED NOT DETECTED Final   Escherichia coli NOT DETECTED NOT DETECTED Final    Klebsiella oxytoca NOT DETECTED NOT DETECTED Final   Klebsiella pneumoniae NOT DETECTED NOT DETECTED Final   Proteus species NOT DETECTED NOT DETECTED Final   Serratia marcescens NOT DETECTED NOT DETECTED Final   Haemophilus influenzae NOT DETECTED NOT DETECTED Final   Neisseria meningitidis NOT DETECTED NOT DETECTED Final   Pseudomonas aeruginosa NOT DETECTED NOT DETECTED Final   Candida albicans NOT DETECTED NOT DETECTED Final   Candida glabrata NOT DETECTED NOT DETECTED Final   Candida krusei NOT DETECTED NOT DETECTED Final   Candida parapsilosis NOT DETECTED NOT DETECTED Final   Candida tropicalis NOT DETECTED NOT DETECTED Final  MRSA PCR Screening     Status: None   Collection Time: 09/20/16  5:10 PM  Result Value Ref Range Status   MRSA by PCR NEGATIVE NEGATIVE Final    Comment:        The GeneXpert MRSA Assay (FDA approved for NASAL specimens only), is one component of a comprehensive MRSA colonization surveillance program. It is not intended to diagnose MRSA infection nor to guide or monitor treatment for MRSA infections.    Studies/Results: Dg Chest 2 View  Result Date: 09/22/2016 CLINICAL DATA:  Coughing blood.  Hypertension EXAM: CHEST  2 VIEW COMPARISON:  09/20/2016 FINDINGS: Small bilateral pleural effusions. Bibasilar opacities could reflect atelectasis or infiltrate/pneumonia. Heart is mildly enlarged. No acute bony abnormality. Prior vertebral augmentation in the lower thoracic spine. IMPRESSION: Small bilateral pleural effusions. Bibasilar atelectasis or infiltrates. Electronically Signed   By: Rolm Baptise M.D.   On: 09/22/2016 08:37   US Abdomen Limited Ruq  Result Date: 09/20/2016 CLINICAL DATA:  Abnormal LFTs EXAM: ULTRASOUND ABDOMEN LIMITED RIGHT UPPER QUADRANT COMPARISON:  07/20/2016 FINDINGS: Gallbladder: No gallstones or wall thickening visualized. No sonographic Murphy sign noted by sonographer. Common bile duct: Diameter: 4.3 mm Liver: Increased  echogenicity and slight surface micro nodularity suspicious for hepatic steatosis and mild cirrhosis, similar to the comparison CT. No focal hepatic abnormality or intrahepatic biliary dilatation. Portal vein is patent on color Doppler imaging with normal direction of blood flow towards the liver. IMPRESSION: Negative for cholelithiasis or biliary dilatation. Hepatic steatosis and findings suggesting mild cirrhosis. Electronically Signed   By: Jerilynn Mages.  Shick M.D.   On: 09/20/2016 16:52   Medications: I have reviewed the patient's current medications. Scheduled Meds: . carbidopa-levodopa  1 tablet Oral QHS  . folic acid  1 mg Oral Daily  . gabapentin  300 mg Oral TID  . Influenza vac split quadrivalent PF  0.5 mL Intramuscular Tomorrow-1000  . ipratropium-albuterol  3 mL Nebulization Q6H  . lisinopril  20 mg  Oral BID  . multivitamin with minerals  1 tablet Oral Daily  . nystatin  5 mL Oral QID  . pantoprazole  40 mg Oral BID  . potassium chloride SA  20 mEq Oral BID  . sertraline  50 mg Oral Daily  . simvastatin  20 mg Oral QHS  . sodium chloride flush  3 mL Intravenous Q12H  . sucralfate  1 g Oral Q6H  . thiamine  100 mg Oral Daily   Or  . thiamine  100 mg Intravenous Daily   Continuous Infusions: PRN Meds:.acetaminophen **OR** acetaminophen, albuterol, LORazepam **OR** LORazepam, ondansetron **OR** ondansetron (ZOFRAN) IV, polyethylene glycol   Assessment: Active Problems:   GI bleed   Charles Stanton is a 61 y.o. y/o male with with a history of cirrhosis , admitted with hematemesis  . Hb stable. Alkaline phosphatase elevated which may be related to the bone fractures. EGD done yesterday showed severe esophagitis ,gastric ulcer non bleeding .   Plan 1.Suggest a soft diet for two weeks due to severe edema of the esophagus and esophagitis. Omeprazole 40 mg BID 2. Out patient follow up at my office with repeat EGD in a few weeks to check for healing of the esophagitis once his pneumonia  has healed.  3. 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 Unclear of the status of that will decide based on next EGD.     LOS: 2 days   Charles Stanton 09/22/2016, 11:18 AM

## 2016-09-23 LAB — CULTURE, BLOOD (ROUTINE X 2)

## 2016-09-23 NOTE — Discharge Summary (Signed)
Charles Stanton at [facility] NAME: Charles Stanton    MR#:  [id number]  DATE OF BIRTH:  06/23/1955  DATE OF ADMISSION:  09/20/2016   ADMITTING PHYSICIAN: Hillary Bow, MD  DATE OF DISCHARGE: 09/22/2016  1:45 PM  PRIMARY CARE PHYSICIAN: [name], MD   ADMISSION DIAGNOSIS:  Fever [R50.9] Gastrointestinal hemorrhage, unspecified gastrointestinal hemorrhage type [K92.2] DISCHARGE DIAGNOSIS:  Active Problems:   GI bleed  SECONDARY DIAGNOSIS:   Past Medical History:  Diagnosis Date  . Abnormal liver function   . Anemia   . Anxiety   . Colonic mass   . Depression   . Dizziness   . Dry cough   . History of colon polyps   . Hypercholesteremia   . Hyperlipemia   . Hypertension   . Palpitations   . Parkinson's disease ([place])   . Parkinson's disease ([place])   . PVD (peripheral vascular disease) ([place])   . Tremors of nervous system    HOSPITAL COURSE:   * Upper GI bleed with coffee-ground emesis - s/p EGD showing nonbleeding gastric ulcer with a clean ulcer base.  Large hiatal hernia.  Grade C reflux esophagitis, erythema in the upper third of the esophagus. -continuing omeprazole 40 mg twice a day for 6 weeks. -Tolerated diet -Carafate 4 times daily  * Sepsis: Ruled out  * Hypertension. Continue lisinopril  * Alcohol abuse: counseled  * Parkinson's disease. Continue carbidopa.  DISCHARGE CONDITIONS:  stable CONSULTS OBTAINED:  Treatment Team:  Jonathon Bellows, MD DRUG ALLERGIES:   Allergies  Allergen Reactions  . Amantadine Other (See Comments)    fatigue sleepy   DISCHARGE MEDICATIONS:   Allergies as of 09/22/2016      Reactions   Amantadine Other (See Comments)   fatigue sleepy      Medication List    STOP taking these medications   ibuprofen 200 MG tablet Commonly known as:  ADVIL,MOTRIN     TAKE these medications   aspirin EC 81 MG tablet Take 81 mg by mouth daily.   carbidopa-levodopa 50-200 MG  tablet Commonly known as:  SINEMET CR Take 1 tablet by mouth at bedtime.   cyanocobalamin 1000 MCG tablet Take 1,000 mcg by mouth daily.   gabapentin 300 MG capsule Commonly known as:  NEURONTIN Take 300 mg by mouth 3 (three) times daily.   lisinopril 20 MG tablet Commonly known as:  PRINIVIL,ZESTRIL Take 20 mg by mouth 2 (two) times daily.   pantoprazole 40 MG tablet Commonly known as:  PROTONIX Take 1 tablet (40 mg total) by mouth 2 (two) times daily.   potassium chloride SA 20 MEQ tablet Commonly known as:  K-DUR,KLOR-CON Take 20 mEq by mouth 2 (two) times daily.   sertraline 50 MG tablet Commonly known as:  ZOLOFT Take 1 tablet (50 mg total) by mouth daily.   simvastatin 20 MG tablet Commonly known as:  ZOCOR Take 20 mg by mouth at bedtime. Reported on 04/14/2015   sucralfate 1 g tablet Commonly known as:  CARAFATE Take 1 tablet (1 g total) by mouth 4 (four) times daily.            Discharge Care Instructions        Start     Ordered   09/22/16 0000  pantoprazole (PROTONIX) 40 MG tablet  2 times daily     09/22/16 1249   09/22/16 0000  Increase activity slowly     09/22/16 1249   09/22/16 0000  Diet - low sodium heart healthy     09/22/16 1249   09/22/16 0000  sucralfate (CARAFATE) 1 g tablet  4 times daily     09/22/16 1249       DISCHARGE INSTRUCTIONS:   DIET:  Regular diet DISCHARGE CONDITION:  Good ACTIVITY:  Activity as tolerated OXYGEN:  Home Oxygen: No.  Oxygen Delivery: room air DISCHARGE LOCATION:  home   If you experience worsening of your admission symptoms, develop shortness of breath, life threatening emergency, suicidal or homicidal thoughts you must seek medical attention immediately by calling 911 or calling your MD immediately  if symptoms less severe.  You Must read complete instructions/literature along with all the possible adverse reactions/side effects for all the Medicines you take and that have been prescribed to you.  Take any new Medicines after you have completely understood and accpet all the possible adverse reactions/side effects.   Please note  You were cared for by a hospitalist during your hospital stay. If you have any questions about your discharge medications or the care you received while you were in the hospital after you are discharged, you can call the unit and asked to speak with the hospitalist on call if the hospitalist that took care of you is not available. Once you are discharged, your primary care physician will handle any further medical issues. Please note that NO REFILLS for any discharge medications will be authorized once you are discharged, as it is imperative that you return to your primary care physician (or establish a relationship with a primary care physician if you do not have one) for your aftercare needs so that they can reassess your need for medications and monitor your lab values.    On the day of Discharge:  VITAL SIGNS:  Blood pressure (!) 147/81, pulse 63, temperature 98 F (36.7 C), resp. rate 20, height 5\' 5"  (1.651 m), weight 66.9 kg (147 lb 8 oz), SpO2 95 %. PHYSICAL EXAMINATION:  GENERAL:  61 y.o.-year-old patient lying in the bed with no acute distress.  EYES: Pupils equal, round, reactive to light and accommodation. No scleral icterus. Extraocular muscles intact.  HEENT: Head atraumatic, normocephalic. Oropharynx and nasopharynx clear.  NECK:  Supple, no jugular venous distention. No thyroid enlargement, no tenderness.  LUNGS: Normal breath sounds bilaterally, no wheezing, rales,rhonchi or crepitation. No use of accessory muscles of respiration.  CARDIOVASCULAR: S1, S2 normal. No murmurs, rubs, or gallops.  ABDOMEN: Soft, non-tender, non-distended. Bowel sounds present. No organomegaly or mass.  EXTREMITIES: No pedal edema, cyanosis, or clubbing.  NEUROLOGIC: Cranial nerves II through XII are intact. Muscle strength 5/5 in all extremities. Sensation intact.  Gait not checked.  PSYCHIATRIC: The patient is alert and oriented x 3.  SKIN: No obvious rash, lesion, or ulcer.  DATA REVIEW:   CBC  Recent Labs Lab 09/22/16 0545  WBC 4.4  HGB 13.4  HCT 39.4*  PLT 110*    Chemistries   Recent Labs Lab 09/21/16 0359 09/22/16 0545  NA 139 141  K 4.1 3.8  CL 107 107  CO2 26 30  GLUCOSE 102* 106*  BUN <5* <5*  CREATININE 0.49* 0.49*  CALCIUM 8.2* 8.4*  AST 31  --   ALT <5*  --   ALKPHOS 123  --   BILITOT 1.4*  --      Follow-up Information    [name], MD. Go on 09/27/2016.   Specialty:  Internal Medicine Why:  @ 11:00 AM Contact information: [address]  [place] [address] [phone]        Jonathon Bellows, MD. Daphane Shepherd on 10/10/2016.   Specialty:  Surgery Why:  @1 :00 PM Contact information: [place] [address] [phone]           Management plans discussed with the patient, family and they are in agreement.  CODE STATUS: Prior   TOTAL TIME TAKING CARE OF THIS PATIENT: 45 minutes.    Max Sane M.D on 09/23/2016 at 9:36 AM  Between 7am to 6pm - Pager - [phone]  After 6pm go to [email or web address] - Proofreader  [facility]  Office  [phone]  CC: Primary care physician; [name], MD   Note: This dictation was prepared with Dragon dictation along with smaller phrase technology. Any transcriptional errors that result from this process are unintentional.

## 2016-09-24 LAB — H. PYLORI ANTIGEN, STOOL: H. PYLORI STOOL AG, EIA: NEGATIVE

## 2016-09-25 LAB — CULTURE, BLOOD (ROUTINE X 2)
Culture: NO GROWTH
SPECIAL REQUESTS: ADEQUATE

## 2016-10-09 NOTE — Progress Notes (Signed)
Jonathon Bellows MD, MRCP(U.K) [address]  [place]  [place], [place] [address]  Main: [phone]  Fax: [phone]   Gastroenterology Consultation  Referring Provider:     [name], MD Primary Care Physician:  [name], MD Primary Gastroenterologist:  Dr. Jonathon Bellows  Reason for Consultation:     Hospital follow up         HPI:   Charles Stanton is a 61 y.o. y/o male referred for consultation & management  by Dr. [name], MD.    He is here today for a hospital follow up .   Summary of history : He was admitted on 09/20/16 for hematemesis. He has a history of liver cirrhosis. Consumes 2-3 glasses of bourbon a day for many years. Ct abdomen 07/10/16- shows cirrhosis ,hepatic steatosis , compression fractures l1,l5,rib fractures. Moderate hiatal hernia. 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 I performed an EGD which showed severe esophagitis LA grade C ,gastric ulcer non bleeding . He was discharged on PPI  Interval history   09/22/2016-  10/09/2016   09/2016- H pylori stool antigen was negative  On prilosec since discharge , doing well, no hematemesis. Stool is brown. He is having issues with chewing his food due to poor dentition. Says he has a poor apetite. He has not lost any weight he states.   Past Medical History:  Diagnosis Date  . Abnormal liver function   . Anemia   . Anxiety   . Colonic mass   . Depression   . Dizziness   . Dry cough   . History of colon polyps   . Hypercholesteremia   . Hyperlipemia   . Hypertension   . Palpitations   . Parkinson's disease ([place])   . Parkinson's disease ([place])   . PVD (peripheral vascular disease) ([place])   . Tremors of nervous system     Past Surgical History:  Procedure Laterality Date  . BACK SURGERY     Kyphoplasty   March 2018  . COLONOSCOPY    . ESOPHAGOGASTRODUODENOSCOPY (EGD) WITH PROPOFOL N/A 09/21/2016   Procedure:  ESOPHAGOGASTRODUODENOSCOPY (EGD) WITH PROPOFOL;  Surgeon: Jonathon Bellows, MD;  Location: [facility] ENDOSCOPY;  Service: Gastroenterology;  Laterality: N/A;  . HERNIA REPAIR     left inguinal hernia repair as an infant  . KYPHOPLASTY N/A 02/11/2016   Procedure: KYPHOPLASTY  L2,T9;  Surgeon: Hessie Knows, MD;  Location: [facility] ORS;  Service: Orthopedics;  Laterality: N/A;  . KYPHOPLASTY N/A 03/15/2016   Procedure: KYPHOPLASTY L3;  Surgeon: Hessie Knows, MD;  Location: [facility] ORS;  Service: Orthopedics;  Laterality: N/A;  . skin cancer removed    . TONSILLECTOMY      Prior to Admission medications   Medication Sig Start Date End Date Taking? Authorizing Provider  cyclobenzaprine (FLEXERIL) 10 MG tablet Take by mouth. 05/18/16  Yes [provider]  esomeprazole (NEXIUM) 40 MG capsule Take by mouth. 05/13/13  Yes [provider]  HYDROcodone-acetaminophen (NORCO/VICODIN) 5-325 MG tablet Take by mouth. 03/30/16  Yes [provider]  aspirin EC 81 MG tablet Take 81 mg by mouth daily.    [provider]  baclofen (LIORESAL) 10 MG tablet  10/03/16   [provider]  carbidopa-levodopa (SINEMET IR) 25-100 MG tablet TK 1 T PO TID 08/16/16   [provider]  cyanocobalamin 1000 MCG tablet Take 1,000 mcg by mouth daily.    [provider]  gabapentin (NEURONTIN) 300 MG capsule Take 300 mg by mouth 3 (three) times daily.  04/04/16 04/04/17  [provider]  hydrochlorothiazide (HYDRODIURIL) 25 MG tablet Take by mouth.    [provider]  lisinopril (PRINIVIL,ZESTRIL) 20 MG tablet Take 20 mg by mouth 2 (two) times daily.    [provider]  loratadine-pseudoephedrine (CLARITIN-D 12-HOUR) 5-120 MG tablet Take by mouth.    [provider]  omeprazole (PRILOSEC) 20 MG capsule  10/01/16   [provider]  pantoprazole (PROTONIX) 40 MG tablet Take 1 tablet (40 mg total) by mouth 2 (two) times daily. 09/22/16   Max Sane, MD    potassium chloride SA (K-DUR,KLOR-CON) 20 MEQ tablet Take 20 mEq by mouth 2 (two) times daily. 06/29/16   [provider]  sertraline (ZOLOFT) 50 MG tablet Take 1 tablet (50 mg total) by mouth daily. 08/08/16   Rainey Pines, MD  simvastatin (ZOCOR) 20 MG tablet Take 20 mg by mouth at bedtime. Reported on 04/14/2015    [provider]  sucralfate (CARAFATE) 1 g tablet Take 1 tablet (1 g total) by mouth 4 (four) times daily. 09/22/16   Max Sane, MD  traZODone (DESYREL) 100 MG tablet Take by mouth.    [provider]    Family History  Problem Relation Age of Onset  . Heart Problems Mother   . Dementia Mother      Social History  Substance Use Topics  . Smoking status: Never Smoker  . Smokeless tobacco: Never Used  . Alcohol use 1.2 oz/week    2 Cans of beer per week     Comment: social    Allergies as of 10/10/2016 - Review Complete 10/10/2016  Allergen Reaction Noted  . Amantadine Other (See Comments) 05/06/2015    Review of Systems:    All systems reviewed and negative except where noted in HPI.   Physical Exam:  BP 130/87 (BP Location: Left Arm, Patient Position: Sitting, Cuff Size: Normal)   Pulse 80   Temp 97.9 F (36.6 C) (Oral)   Ht 5\' 3"  (1.6 m)   Wt 139 lb 6.4 oz (63.2 kg)   BMI 24.69 kg/m  No LMP for male patient. Psych:  Alert and cooperative. Normal mood and affect. General:   Alert,  Well-developed, well-nourished, pleasant and cooperative in NAD Head:  Normocephalic and atraumatic. Eyes:  Sclera clear, no icterus.   Conjunctiva pink. Ears:  Normal auditory acuity. Nose:  No deformity, discharge, or lesions. Mouth:  Very poor dentition  Neck:  Supple; no masses or thyromegaly. Lungs:  Respirations even and unlabored.  Clear throughout to auscultation.   No wheezes, crackles, or rhonchi. No acute distress. Heart:  Regular rate and rhythm; no murmurs, clicks, rubs, or gallops. Abdomen:  Normal bowel sounds.  No bruits.  Soft,  non-tender and non-distended without masses, hepatosplenomegaly or hernias noted.  No guarding or rebound tenderness.    Neurologic:  Alert and oriented x3;  grossly normal neurologically. Skin:  Intact without significant lesions or rashes. No jaundice. Lymph Nodes:  No significant cervical adenopathy. Psych:  Alert and cooperative. Normal mood and affect.  Imaging Studies: Dg Chest 2 View  Result Date: 09/22/2016 CLINICAL DATA:  Coughing blood.  Hypertension EXAM: CHEST  2 VIEW COMPARISON:  09/20/2016 FINDINGS: Small bilateral pleural effusions. Bibasilar opacities could reflect atelectasis or infiltrate/pneumonia. Heart is mildly enlarged. No acute bony abnormality. Prior vertebral augmentation in the lower thoracic spine. IMPRESSION: Small bilateral pleural effusions. Bibasilar atelectasis or  infiltrates. Electronically Signed   By: Rolm Baptise M.D.   On: 09/22/2016 08:37   Dg Chest 2 View  Result Date: 09/20/2016 CLINICAL DATA:  Hematemesis for 5 days EXAM: CHEST  2 VIEW COMPARISON:  January 27, 2016 FINDINGS: There is bibasilar atelectatic change with questionable mild superimposed scarring. There is edema or consolidation. Heart size and pulmonary vascularity are normal. No adenopathy. There is a focal hiatal hernia. Patient is status post kyphoplasty procedures at T9, L2, L3. There is anterior wedging of the L1 vertebral body. IMPRESSION: Atelectatic change with mild superimposed scarring in the bases. No edema or consolidation. Heart size normal. Hiatal hernia present. Multiple compression fractures in the lower thoracic and visualized lumbar regions. Electronically Signed   By: Lowella Grip III M.D.   On: 09/20/2016 10:04   US Abdomen Limited Ruq  Result Date: 09/20/2016 CLINICAL DATA:  Abnormal LFTs EXAM: ULTRASOUND ABDOMEN LIMITED RIGHT UPPER QUADRANT COMPARISON:  07/20/2016 FINDINGS: Gallbladder: No gallstones or wall thickening visualized. No sonographic Murphy sign noted by  sonographer. Common bile duct: Diameter: 4.3 mm Liver: Increased echogenicity and slight surface micro nodularity suspicious for hepatic steatosis and mild cirrhosis, similar to the comparison CT. No focal hepatic abnormality or intrahepatic biliary dilatation. Portal vein is patent on color Doppler imaging with normal direction of blood flow towards the liver. IMPRESSION: Negative for cholelithiasis or biliary dilatation. Hepatic steatosis and findings suggesting mild cirrhosis. Electronically Signed   By: Jerilynn Mages.  Shick M.D.   On: 09/20/2016 16:52    Assessment and Plan:   Charles Stanton is a 61 y.o. y/o male h with a history of cirrhosis was recently admitted with hematemesis to the hospital, found to have acute severe esophagitis .Incidentally noted to have elevated alkaline phosphatase   Plan 1. Continue PPI 2. Repeat EGD in 6 weeks to check for healing 3. CMP,Fractionated alkaline phosphatase, Vitamin D level. CBC 4. Advised to seek a dentist for poor dentition  I have discussed alternative options, risks & benefits,  which include, but are not limited to, bleeding, infection, perforation,respiratory complication & drug reaction.  The patient agrees with this plan & written consent will be obtained.    Follow up in 8 weeks +  Dr Jonathon Bellows MD,MRCP(U.K)

## 2016-10-10 ENCOUNTER — Ambulatory Visit (INDEPENDENT_AMBULATORY_CARE_PROVIDER_SITE_OTHER): Payer: Federal, State, Local not specified - PPO | Admitting: Gastroenterology

## 2016-10-10 ENCOUNTER — Encounter: Payer: Self-pay | Admitting: Gastroenterology

## 2016-10-10 VITALS — BP 130/87 | HR 80 | Temp 97.9°F | Ht 63.0 in | Wt 139.4 lb

## 2016-10-10 DIAGNOSIS — K209 Esophagitis, unspecified without bleeding: Secondary | ICD-10-CM

## 2016-10-10 DIAGNOSIS — K2210 Ulcer of esophagus without bleeding: Secondary | ICD-10-CM | POA: Insufficient documentation

## 2016-10-10 DIAGNOSIS — K222 Esophageal obstruction: Secondary | ICD-10-CM | POA: Insufficient documentation

## 2016-10-10 DIAGNOSIS — R945 Abnormal results of liver function studies: Secondary | ICD-10-CM

## 2016-10-10 DIAGNOSIS — R7989 Other specified abnormal findings of blood chemistry: Secondary | ICD-10-CM

## 2016-10-10 NOTE — Addendum Note (Signed)
Addended by: Peggye Ley on: 10/10/2016 01:32 PM   Modules accepted: Orders, SmartSet

## 2016-10-26 LAB — CBC WITH DIFFERENTIAL/PLATELET
BASOS ABS: 0 10*3/uL (ref 0.0–0.2)
BASOS: 0 %
EOS (ABSOLUTE): 0 10*3/uL (ref 0.0–0.4)
Eos: 0 %
HEMATOCRIT: 49.5 % (ref 37.5–51.0)
HEMOGLOBIN: 17.8 g/dL — AB (ref 13.0–17.7)
Immature Grans (Abs): 0 10*3/uL (ref 0.0–0.1)
Immature Granulocytes: 0 %
LYMPHS ABS: 1.4 10*3/uL (ref 0.7–3.1)
Lymphs: 16 %
MCH: 33.4 pg — AB (ref 26.6–33.0)
MCHC: 36 g/dL — AB (ref 31.5–35.7)
MCV: 93 fL (ref 79–97)
Monocytes Absolute: 1 10*3/uL — ABNORMAL HIGH (ref 0.1–0.9)
Monocytes: 11 %
NEUTROS ABS: 6.8 10*3/uL (ref 1.4–7.0)
Neutrophils: 73 %
Platelets: 160 10*3/uL (ref 150–379)
RBC: 5.33 x10E6/uL (ref 4.14–5.80)
RDW: 17.4 % — ABNORMAL HIGH (ref 12.3–15.4)
WBC: 9.3 10*3/uL (ref 3.4–10.8)

## 2016-10-26 LAB — PTH, INTACT AND CALCIUM: PTH: 23 pg/mL (ref 15–65)

## 2016-10-26 LAB — COMPREHENSIVE METABOLIC PANEL
ALBUMIN: 3.9 g/dL (ref 3.6–4.8)
ALT: 21 IU/L (ref 0–44)
AST: 60 IU/L — ABNORMAL HIGH (ref 0–40)
Albumin/Globulin Ratio: 1.4 (ref 1.2–2.2)
Alkaline Phosphatase: 205 IU/L — ABNORMAL HIGH (ref 39–117)
BUN / CREAT RATIO: 15 (ref 10–24)
BUN: 8 mg/dL (ref 8–27)
Bilirubin Total: 2 mg/dL — ABNORMAL HIGH (ref 0.0–1.2)
CALCIUM: 9.4 mg/dL (ref 8.6–10.2)
CO2: 25 mmol/L (ref 20–29)
CREATININE: 0.54 mg/dL — AB (ref 0.76–1.27)
Chloride: 90 mmol/L — ABNORMAL LOW (ref 96–106)
GFR, EST AFRICAN AMERICAN: 132 mL/min/{1.73_m2} (ref >59)
GFR, EST NON AFRICAN AMERICAN: 114 mL/min/{1.73_m2} (ref >59)
Globulin, Total: 2.7 g/dL (ref 1.5–4.5)
Glucose: 103 mg/dL — ABNORMAL HIGH (ref 65–99)
Potassium: 3.1 mmol/L — ABNORMAL LOW (ref 3.5–5.2)
Sodium: 136 mmol/L (ref 134–144)
TOTAL PROTEIN: 6.6 g/dL (ref 6.0–8.5)

## 2016-10-26 LAB — VITAMIN D 1,25 DIHYDROXY
VITAMIN D3 1, 25 (OH): 63 pg/mL
Vitamin D 1, 25 (OH)2 Total: 67 pg/mL — ABNORMAL HIGH
Vitamin D2 1, 25 (OH)2: <10 pg/mL

## 2016-10-26 LAB — ALKALINE PHOSPHATASE, ISOENZYMES
BONE FRACTION: 47 % (ref 12–68)
INTESTINAL FRAC.: 0 % (ref 0–18)
LIVER FRACTION: 53 % (ref 13–88)

## 2016-10-26 LAB — GAMMA GT: GGT: 359 IU/L — ABNORMAL HIGH (ref 0–65)

## 2016-11-04 ENCOUNTER — Telehealth: Payer: Self-pay

## 2016-11-04 ENCOUNTER — Other Ambulatory Visit: Payer: Self-pay

## 2016-11-04 DIAGNOSIS — R945 Abnormal results of liver function studies: Secondary | ICD-10-CM

## 2016-11-04 DIAGNOSIS — R7989 Other specified abnormal findings of blood chemistry: Secondary | ICD-10-CM

## 2016-11-04 NOTE — Telephone Encounter (Signed)
Advised patient of lab results per Dr. Vicente Males.   New lab orders added. Patient aware.   MRCP ordered: Scheduling for patient. Requesting the week of 11/12.  Scheduled for 11/12 @830am  @ [place]. NPO 4 hours.

## 2016-11-07 ENCOUNTER — Ambulatory Visit: Payer: Federal, State, Local not specified - PPO | Admitting: Anesthesiology

## 2016-11-07 ENCOUNTER — Encounter: Payer: Self-pay | Admitting: *Deleted

## 2016-11-07 ENCOUNTER — Ambulatory Visit
Admission: RE | Admit: 2016-11-07 | Discharge: 2016-11-07 | Disposition: A | Discharge status: Home / Self Care | Payer: Federal, State, Local not specified - PPO | Source: Ambulatory Visit | Attending: Gastroenterology | Admitting: Gastroenterology

## 2016-11-07 ENCOUNTER — Ambulatory Visit: Payer: Federal, State, Local not specified - PPO | Admitting: Psychiatry

## 2016-11-07 ENCOUNTER — Encounter
Admission: RE | Disposition: A | Discharge status: Home / Self Care | Payer: Self-pay | Source: Ambulatory Visit | Attending: Gastroenterology

## 2016-11-07 DIAGNOSIS — F329 Major depressive disorder, single episode, unspecified: Secondary | ICD-10-CM | POA: Insufficient documentation

## 2016-11-07 DIAGNOSIS — Z820 Family history of epilepsy and other diseases of the nervous system: Secondary | ICD-10-CM | POA: Insufficient documentation

## 2016-11-07 DIAGNOSIS — Z86010 Personal history of colonic polyps: Secondary | ICD-10-CM | POA: Diagnosis not present

## 2016-11-07 DIAGNOSIS — Z79899 Other long term (current) drug therapy: Secondary | ICD-10-CM | POA: Insufficient documentation

## 2016-11-07 DIAGNOSIS — E785 Hyperlipidemia, unspecified: Secondary | ICD-10-CM | POA: Insufficient documentation

## 2016-11-07 DIAGNOSIS — K209 Esophagitis, unspecified without bleeding: Secondary | ICD-10-CM

## 2016-11-07 DIAGNOSIS — I739 Peripheral vascular disease, unspecified: Secondary | ICD-10-CM | POA: Insufficient documentation

## 2016-11-07 DIAGNOSIS — K210 Gastro-esophageal reflux disease with esophagitis: Secondary | ICD-10-CM | POA: Insufficient documentation

## 2016-11-07 DIAGNOSIS — Z8249 Family history of ischemic heart disease and other diseases of the circulatory system: Secondary | ICD-10-CM | POA: Diagnosis not present

## 2016-11-07 DIAGNOSIS — R7989 Other specified abnormal findings of blood chemistry: Secondary | ICD-10-CM

## 2016-11-07 DIAGNOSIS — K229 Disease of esophagus, unspecified: Secondary | ICD-10-CM | POA: Diagnosis not present

## 2016-11-07 DIAGNOSIS — D649 Anemia, unspecified: Secondary | ICD-10-CM | POA: Diagnosis not present

## 2016-11-07 DIAGNOSIS — R002 Palpitations: Secondary | ICD-10-CM | POA: Diagnosis not present

## 2016-11-07 DIAGNOSIS — K3189 Other diseases of stomach and duodenum: Secondary | ICD-10-CM | POA: Insufficient documentation

## 2016-11-07 DIAGNOSIS — E119 Type 2 diabetes mellitus without complications: Secondary | ICD-10-CM | POA: Diagnosis not present

## 2016-11-07 DIAGNOSIS — K2970 Gastritis, unspecified, without bleeding: Secondary | ICD-10-CM | POA: Diagnosis not present

## 2016-11-07 DIAGNOSIS — E7800 Pure hypercholesterolemia, unspecified: Secondary | ICD-10-CM | POA: Insufficient documentation

## 2016-11-07 DIAGNOSIS — I10 Essential (primary) hypertension: Secondary | ICD-10-CM | POA: Insufficient documentation

## 2016-11-07 DIAGNOSIS — F419 Anxiety disorder, unspecified: Secondary | ICD-10-CM | POA: Insufficient documentation

## 2016-11-07 DIAGNOSIS — G20 Parkinson's disease: Secondary | ICD-10-CM | POA: Diagnosis not present

## 2016-11-07 DIAGNOSIS — Z85828 Personal history of other malignant neoplasm of skin: Secondary | ICD-10-CM | POA: Insufficient documentation

## 2016-11-07 DIAGNOSIS — Z7982 Long term (current) use of aspirin: Secondary | ICD-10-CM | POA: Diagnosis not present

## 2016-11-07 DIAGNOSIS — Z888 Allergy status to other drugs, medicaments and biological substances status: Secondary | ICD-10-CM | POA: Diagnosis not present

## 2016-11-07 DIAGNOSIS — R945 Abnormal results of liver function studies: Secondary | ICD-10-CM

## 2016-11-07 HISTORY — PX: ESOPHAGOGASTRODUODENOSCOPY (EGD) WITH PROPOFOL: SHX5813

## 2016-11-07 SURGERY — ESOPHAGOGASTRODUODENOSCOPY (EGD) WITH PROPOFOL
Anesthesia: General

## 2016-11-07 MED ORDER — LIDOCAINE 2% (20 MG/ML) 5 ML SYRINGE
INTRAMUSCULAR | Status: DC | PRN
  Administered 2016-11-07: 25 mg via INTRAVENOUS

## 2016-11-07 MED ORDER — SODIUM CHLORIDE 0.9 % IV SOLN
INTRAVENOUS | Status: DC
  Administered 2016-11-07: 08:00:00 via INTRAVENOUS

## 2016-11-07 MED ORDER — MIDAZOLAM HCL 2 MG/2ML IJ SOLN
INTRAMUSCULAR | Status: AC
  Filled 2016-11-07: qty 2

## 2016-11-07 MED ORDER — LIDOCAINE HCL (PF) 2 % IJ SOLN
INTRAMUSCULAR | Status: AC
  Filled 2016-11-07: qty 10

## 2016-11-07 MED ORDER — PROPOFOL 500 MG/50ML IV EMUL
INTRAVENOUS | Status: DC | PRN
  Administered 2016-11-07: 120 ug/kg/min via INTRAVENOUS

## 2016-11-07 MED ORDER — GLYCOPYRROLATE 0.2 MG/ML IJ SOLN
INTRAMUSCULAR | Status: AC
  Filled 2016-11-07: qty 1

## 2016-11-07 MED ORDER — PROPOFOL 500 MG/50ML IV EMUL
INTRAVENOUS | Status: AC
Start: 2016-11-07 — End: ?
  Filled 2016-11-07: qty 50

## 2016-11-07 MED ORDER — PROPOFOL 10 MG/ML IV BOLUS
INTRAVENOUS | Status: DC | PRN
  Administered 2016-11-07 (×2): 50 mg via INTRAVENOUS

## 2016-11-07 MED ORDER — GLYCOPYRROLATE 0.2 MG/ML IJ SOLN
INTRAMUSCULAR | Status: DC | PRN
  Administered 2016-11-07: 0.2 mg via INTRAVENOUS

## 2016-11-07 MED ORDER — MIDAZOLAM HCL 5 MG/5ML IJ SOLN
INTRAMUSCULAR | Status: DC | PRN
  Administered 2016-11-07: 2 mg via INTRAVENOUS

## 2016-11-07 NOTE — Op Note (Addendum)
[facility] Patient Name: Charles Stanton Procedure Date: 11/07/2016 7:56 AM MRN: [id number] Account #: [id number] Date of Birth: 1955-06-05 Admit Type: Outpatient Age: 61 Room: [facility] ENDO ROOM 4 Gender: Male Note Status: Finalized Procedure:            Upper GI endoscopy Indications:          Follow-up of esophagitis Providers:            Jonathon Bellows MD, MD Referring MD:         [name], MD (Referring MD) Medicines:            Monitored Anesthesia Care Complications:        No immediate complications. Procedure:            Pre-Anesthesia Assessment:                       - Prior to the procedure, a History and Physical was                        performed, and patient medications, allergies and                        sensitivities were reviewed. The patient's tolerance of                        previous anesthesia was reviewed.                       - The risks and benefits of the procedure and the                        sedation options and risks were discussed with the                        patient. All questions were answered and informed                        consent was obtained.                       - ASA Grade Assessment: III - A patient with severe                        systemic disease.                       After obtaining informed consent, the endoscope was                        passed under direct vision. Throughout the procedure,                        the patient's blood pressure, pulse, and oxygen                        saturations were monitored continuously. The Endoscope                        was introduced through the mouth, and advanced to the  third part of duodenum. The upper GI endoscopy was                        accomplished with ease. The patient tolerated the                        procedure well. Findings:      The examined duodenum was normal.      Diffuse moderate inflammation  characterized by congestion (edema) and       erythema was found in the gastric antrum and in the prepyloric region of       the stomach. Biopsies were taken with a cold forceps for histology.      A single umbilicated lesion measuring 8 mm in diameter was found in the       gastric antrum.      The single umbilicated lesion could be a pancreatic rest      An area of moderate mucosal changes characterized by congestion and an       increased vascular pattern were found at the gastroesophageal junction.       The areas was protruding just below the Ge junction and at the 9 oclock       position , including an area of the Ge junction , Biopsies were taken       with a cold forceps for histology.      LA Grade C (one or more mucosal breaks continuous between tops of 2 or       more mucosal folds, less than 75% circumference) esophagitis with no       bleeding was found in the lower third of the esophagus.      The cardia and gastric fundus were normal on retroflexion.      A large hiatal hernia was present. Impression:           - Normal examined duodenum.                       - Gastritis. Biopsied.                       - A single lesion suspicious for aberrant pancreas was                        found in the stomach.                       - Congested, increased vascular pattern mucosa in the                        esophagus. Biopsied.                       - LA Grade C reflux esophagitis. Recommendation:       - Discharge patient to home (with escort).                       - Resume previous diet.                       - Continue present medications.                       - Await pathology results.                       -  Return to my office in 4 weeks. Procedure Code(s):    --- Professional ---                       [phone], Esophagogastroduodenoscopy, flexible, transoral;                        with biopsy, single or multiple Diagnosis Code(s):    --- Professional ---                        K29.70, Gastritis, unspecified, without bleeding                       K31.89, Other diseases of stomach and duodenum                       K22.8, Other specified diseases of esophagus                       K21.0, Gastro-esophageal reflux disease with esophagitis CPT copyright 2016 American Medical Association. All rights reserved. The codes documented in this report are preliminary and upon coder review may  be revised to meet current compliance requirements. Jonathon Bellows, MD Jonathon Bellows MD, MD 11/07/2016 8:18:56 AM This report has been signed electronically. Number of Addenda: 0 Note Initiated On: 11/07/2016 7:56 AM      [facility]

## 2016-11-07 NOTE — Anesthesia Preprocedure Evaluation (Addendum)
Anesthesia Evaluation  Patient identified by MRN, date of birth, ID band Patient awake    Reviewed: Allergy & Precautions, NPO status , Patient's Chart, lab work & pertinent test results  History of Anesthesia Complications Negative for: history of anesthetic complications  Airway Mallampati: III  TM Distance: >3 FB Neck ROM: Full    Dental  (+) Poor Dentition, Chipped, Missing   Pulmonary neg pulmonary ROS, neg sleep apnea, neg COPD,    breath sounds clear to auscultation- rhonchi (-) wheezing      Cardiovascular hypertension, Pt. on medications (-) CAD, (-) Past MI and (-) Cardiac Stents  Rhythm:Regular Rate:Normal - Systolic murmurs and - Diastolic murmurs    Neuro/Psych PSYCHIATRIC DISORDERS Anxiety Depression Parkinson's disease    GI/Hepatic Neg liver ROS, PUD, GERD  ,  Endo/Other  diabetes  Renal/GU negative Renal ROS     Musculoskeletal negative musculoskeletal ROS (+)   Abdominal (+) - obese,   Peds  Hematology  (+) anemia ,   Anesthesia Other Findings Past Medical History: No date: Abnormal liver function No date: Anemia No date: Anxiety No date: Colonic mass No date: Depression No date: Dizziness No date: Dry cough No date: History of colon polyps No date: Hypercholesteremia No date: Hyperlipemia No date: Hypertension No date: Palpitations No date: Parkinson's disease ([place]) No date: Parkinson's disease ([place]) No date: PVD (peripheral vascular disease) ([place]) No date: Tremors of nervous system   Reproductive/Obstetrics                             Anesthesia Physical Anesthesia Plan  ASA: III  Anesthesia Plan: General   Post-op Pain Management:    Induction: Intravenous  PONV Risk Score and Plan: 1 and Propofol infusion  Airway Management Planned: Natural Airway  Additional Equipment:   Intra-op Plan:   Post-operative Plan:   Informed Consent: I have  reviewed the patients History and Physical, chart, labs and discussed the procedure including the risks, benefits and alternatives for the proposed anesthesia with the patient or authorized representative who has indicated his/her understanding and acceptance.   Dental advisory given  Plan Discussed with: CRNA and Anesthesiologist  Anesthesia Plan Comments:        Anesthesia Quick Evaluation

## 2016-11-07 NOTE — Transfer of Care (Signed)
Immediate Anesthesia Transfer of Care Note  Patient: Charles Stanton  Procedure(s) Performed: ESOPHAGOGASTRODUODENOSCOPY (EGD) WITH PROPOFOL (N/A )  Patient Location: PACU and Endoscopy Unit  Anesthesia Type:General  Level of Consciousness: awake  Airway & Oxygen Therapy: Patient Spontanous Breathing and Patient connected to nasal cannula oxygen  Post-op Assessment: Report given to RN and Post -op Vital signs reviewed and stable  Post vital signs: Reviewed  Last Vitals:  Vitals:   11/07/16 0810 11/07/16 0818  BP: 113/69 113/69  Pulse: (!) 112   Resp: (!) 22 17  Temp: 36.6 C   SpO2: 97% 97%    Last Pain:  Vitals:   11/07/16 0810  TempSrc: Tympanic         Complications: No apparent anesthesia complications

## 2016-11-07 NOTE — Anesthesia Post-op Follow-up Note (Signed)
Anesthesia QCDR form completed.        

## 2016-11-07 NOTE — H&P (Signed)
Charles Bellows, MD [address] [place], [place], [place], [address] [address], [place], [place], [place], [address] Phone: [phone]  Fax: [phone]  Primary Care Physician:  [name], MD   Pre-Procedure History & Physical: HPI:  Charles Stanton is a 61 y.o. male is here for an endoscopy    Past Medical History:  Diagnosis Date  . Abnormal liver function   . Anemia   . Anxiety   . Colonic mass   . Depression   . Dizziness   . Dry cough   . History of colon polyps   . Hypercholesteremia   . Hyperlipemia   . Hypertension   . Palpitations   . Parkinson's disease ([place])   . Parkinson's disease ([place])   . PVD (peripheral vascular disease) ([place])   . Tremors of nervous system     Past Surgical History:  Procedure Laterality Date  . BACK SURGERY     Kyphoplasty   March 2018  . COLONOSCOPY    . HERNIA REPAIR     left inguinal hernia repair as an infant  . skin cancer removed    . TONSILLECTOMY      Prior to Admission medications   Medication Sig Start Date End Date Taking? Authorizing Provider  aspirin EC 81 MG tablet Take 81 mg by mouth daily.   Yes [provider]  baclofen (LIORESAL) 10 MG tablet  10/03/16  Yes [provider]  carbidopa-levodopa (SINEMET IR) 25-100 MG tablet TK 1 T PO TID 08/16/16  Yes [provider]  cyanocobalamin 1000 MCG tablet Take 1,000 mcg by mouth daily.   Yes [provider]  esomeprazole (NEXIUM) 40 MG capsule Take by mouth. 05/13/13  Yes [provider]  gabapentin (NEURONTIN) 300 MG capsule Take 300 mg by mouth 3 (three) times daily.  04/04/16 04/04/17 Yes [provider]  hydrochlorothiazide (HYDRODIURIL) 25 MG tablet Take by mouth.   Yes [provider]  lisinopril (PRINIVIL,ZESTRIL) 20 MG tablet Take 20 mg by mouth 2 (two) times daily.   Yes [provider]  potassium chloride SA (K-DUR,KLOR-CON) 20 MEQ tablet Take 20 mEq by mouth 2 (two) times  daily. 06/29/16  Yes [provider]  sertraline (ZOLOFT) 50 MG tablet Take 1 tablet (50 mg total) by mouth daily. 08/08/16  Yes Rainey Pines, MD  simvastatin (ZOCOR) 20 MG tablet Take 20 mg by mouth at bedtime. Reported on 04/14/2015   Yes [provider]  sucralfate (CARAFATE) 1 g tablet Take 1 tablet (1 g total) by mouth 4 (four) times daily. 09/22/16  Yes Max Sane, MD  traZODone (DESYREL) 100 MG tablet Take by mouth.   Yes [provider]  cyclobenzaprine (FLEXERIL) 10 MG tablet Take by mouth. 05/18/16   [provider]  HYDROcodone-acetaminophen (NORCO/VICODIN) 5-325 MG tablet Take by mouth. 03/30/16   [provider]  loratadine-pseudoephedrine (CLARITIN-D 12-HOUR) 5-120 MG tablet Take by mouth.    [provider]  omeprazole (PRILOSEC) 20 MG capsule  10/01/16   [provider]  pantoprazole (PROTONIX) 40 MG tablet Take 1 tablet (40 mg total) by mouth 2 (two) times daily. Patient not taking: Reported on 10/10/2016 09/22/16   Max Sane, MD    Allergies as of 10/10/2016 - Review Complete 10/10/2016  Allergen Reaction Noted  . Amantadine Other (See Comments) 05/06/2015    Family History  Problem Relation Age of Onset  . Heart Problems Mother   . Dementia Mother  Social History   Socioeconomic History  . Marital status: Married    Spouse name: Not on file  . Number of children: Not on file  . Years of education: Not on file  . Highest education level: Not on file  Social Needs  . Financial resource strain: Not on file  . Food insecurity - worry: Not on file  . Food insecurity - inability: Not on file  . Transportation needs - medical: Not on file  . Transportation needs - non-medical: Not on file  Occupational History  . Not on file  Tobacco Use  . Smoking status: Never Smoker  . Smokeless tobacco: Never Used  Substance and Sexual Activity  . Alcohol use: Yes    Alcohol/week: 1.2 oz    Types: 2 Cans of beer  per week    Comment: social  . Drug use: No  . Sexual activity: Yes  Other Topics Concern  . Not on file  Social History Narrative  . Not on file    Review of Systems: See HPI, otherwise negative ROS  Physical Exam: BP (!) 151/109   Pulse (!) 111   Temp (!) 97 F (36.1 C) (Tympanic)   Resp 16   Ht 5\' 3"  (1.6 m)   Wt 139 lb (63 kg)   SpO2 99%   BMI 24.62 kg/m  General:   Alert,  pleasant and cooperative in NAD Head:  Normocephalic and atraumatic. Neck:  Supple; no masses or thyromegaly. Lungs:  Clear throughout to auscultation, normal respiratory effort.    Heart:  +S1, +S2, Regular rate and rhythm, No edema. Abdomen:  Soft, nontender and nondistended. Normal bowel sounds, without guarding, and without rebound.   Neurologic:  Alert and  oriented x4;  grossly normal neurologically.coarse tremor of right hand  Impression/Plan: Charles Stanton is here for an colonoscopy to be performed for an endoscopy being performed for evaluation of severe esopjagitis    Risks, benefits, limitations, and alternatives regarding endoscopy have been reviewed with the patient.  Questions have been answered.  All parties agreeable.   Charles Bellows, MD  11/07/2016, 7:55 AM

## 2016-11-07 NOTE — Anesthesia Postprocedure Evaluation (Signed)
Anesthesia Post Note  Patient: Charles Stanton  Procedure(s) Performed: ESOPHAGOGASTRODUODENOSCOPY (EGD) WITH PROPOFOL (N/A )  Patient location during evaluation: Endoscopy Anesthesia Type: General Level of consciousness: awake and alert and oriented Pain management: pain level controlled Vital Signs Assessment: post-procedure vital signs reviewed and stable Respiratory status: spontaneous breathing, nonlabored ventilation and respiratory function stable Cardiovascular status: blood pressure returned to baseline and stable Postop Assessment: no signs of nausea or vomiting Anesthetic complications: no     Last Vitals:  Vitals:   11/07/16 0900 11/07/16 0910  BP: (!) 128/94 (!) 132/99  Pulse: 62 (!) 103  Resp: (!) 31 (!) 29  Temp:    SpO2: 97% 98%    Last Pain:  Vitals:   11/07/16 0810  TempSrc: Tympanic                 Lennart Gladish

## 2016-11-08 LAB — SURGICAL PATHOLOGY

## 2016-11-14 ENCOUNTER — Ambulatory Visit: Payer: Federal, State, Local not specified - PPO

## 2016-12-14 ENCOUNTER — Ambulatory Visit: Payer: Federal, State, Local not specified - PPO

## 2016-12-19 ENCOUNTER — Ambulatory Visit: Payer: Federal, State, Local not specified - PPO

## 2017-01-06 ENCOUNTER — Emergency Department: Payer: Medicare Other

## 2017-01-06 ENCOUNTER — Emergency Department
Admission: EM | Admit: 2017-01-06 | Discharge: 2017-01-06 | Disposition: A | Discharge status: Home / Self Care | Payer: Medicare Other | Attending: Emergency Medicine | Admitting: Emergency Medicine

## 2017-01-06 ENCOUNTER — Encounter: Payer: Self-pay | Admitting: Emergency Medicine

## 2017-01-06 DIAGNOSIS — Y999 Unspecified external cause status: Secondary | ICD-10-CM | POA: Insufficient documentation

## 2017-01-06 DIAGNOSIS — Z85828 Personal history of other malignant neoplasm of skin: Secondary | ICD-10-CM | POA: Insufficient documentation

## 2017-01-06 DIAGNOSIS — I10 Essential (primary) hypertension: Secondary | ICD-10-CM | POA: Diagnosis not present

## 2017-01-06 DIAGNOSIS — S199XXA Unspecified injury of neck, initial encounter: Secondary | ICD-10-CM | POA: Diagnosis present

## 2017-01-06 DIAGNOSIS — Y939 Activity, unspecified: Secondary | ICD-10-CM | POA: Diagnosis not present

## 2017-01-06 DIAGNOSIS — E119 Type 2 diabetes mellitus without complications: Secondary | ICD-10-CM | POA: Insufficient documentation

## 2017-01-06 DIAGNOSIS — Y929 Unspecified place or not applicable: Secondary | ICD-10-CM | POA: Diagnosis not present

## 2017-01-06 DIAGNOSIS — G20 Parkinson's disease: Secondary | ICD-10-CM | POA: Diagnosis not present

## 2017-01-06 DIAGNOSIS — S139XXA Sprain of joints and ligaments of unspecified parts of neck, initial encounter: Secondary | ICD-10-CM | POA: Insufficient documentation

## 2017-01-06 DIAGNOSIS — S20229A Contusion of unspecified back wall of thorax, initial encounter: Secondary | ICD-10-CM | POA: Diagnosis not present

## 2017-01-06 DIAGNOSIS — W19XXXA Unspecified fall, initial encounter: Secondary | ICD-10-CM

## 2017-01-06 DIAGNOSIS — W010XXA Fall on same level from slipping, tripping and stumbling without subsequent striking against object, initial encounter: Secondary | ICD-10-CM | POA: Insufficient documentation

## 2017-01-06 DIAGNOSIS — Z79899 Other long term (current) drug therapy: Secondary | ICD-10-CM | POA: Diagnosis not present

## 2017-01-06 MED ORDER — TRAMADOL HCL 50 MG PO TABS: 50.0000 mg | ORAL_TABLET | Freq: Four times a day (QID) | ORAL | 0 refills | Status: AC | PRN

## 2017-01-06 MED ORDER — KETOROLAC TROMETHAMINE 30 MG/ML IJ SOLN
30.0000 mg | Freq: Once | INTRAMUSCULAR | Status: AC
  Administered 2017-01-06: 30 mg via INTRAMUSCULAR
  Filled 2017-01-06: qty 1

## 2017-01-06 NOTE — ED Notes (Signed)
Pt in NAD at time of d/c, pt verbalizes d/c teahcing and rx. Pt with spouse, in wheelchair to lobby. Pt unable to sign due to signature topax malfnx

## 2017-01-06 NOTE — ED Provider Notes (Signed)
[facility] Emergency Department Provider Note   ____________________________________________    I have reviewed the triage vital signs and the nursing notes.   HISTORY  Chief Complaint Fall     HPI Charles Stanton is a 62 y.o. male who presents after a fall.  Patient reports he has a history of Parkinson's and has severe balance issues.  He reports he lost his balance and fell backwards into a plastic tub.  He reports he was trapped there for approximately 1 hour until he was able to get his phone.  He complains of left-sided neck pain as well as some mid back pain.  No chest pain no abdominal pain.  No lower extremity injuries.  He also hit his head but denies LOC.  On blood thinners.   Past Medical History:  Diagnosis Date  . Abnormal liver function   . Anemia   . Anxiety   . Colonic mass   . Depression   . Dizziness   . Dry cough   . History of colon polyps   . Hypercholesteremia   . Hyperlipemia   . Hypertension   . Palpitations   . Parkinson's disease ([place])   . Parkinson's disease ([place])   . PVD (peripheral vascular disease) ([place])   . Tremors of nervous system     Patient Active Problem List   Diagnosis Date Noted  . Esophageal obstruction 10/10/2016  . Esophageal ulceration 10/10/2016  . GI bleed 09/20/2016  . Low vitamin B12 level 03/18/2016  . S/P kyphoplasty 03/18/2016  . Basal cell carcinoma 02/08/2016  . Atrial fibrillation ([place]) 01/27/2016  . Colitis 01/27/2016  . HLD (hyperlipidemia) 01/27/2016  . Anxiety 01/27/2016  . Hypokalemia 01/27/2016  . Major depressive disorder, recurrent episode, moderate ([place]) 04/14/2015  . Anxiety, generalized 12/06/2013  . Parkinson disease ([place]) 08/07/2013  . Dysphagia 05/12/2013  . Esophageal mass 05/12/2013  . GERD (gastroesophageal reflux disease) 05/12/2013  . Vomiting 05/12/2013  . Open bite of lower leg 05/10/2013  . Dog bite of lower leg 05/10/2013  . Abnormal finding on liver  function 03/25/2013  . Benign neoplasm 03/25/2013  . Dizziness 03/25/2013  . Type 2 diabetes mellitus ([place]) 03/25/2013  . BP (high blood pressure) 03/25/2013  . Awareness of heartbeats 03/25/2013  . Pure hypercholesterolemia 03/25/2013  . Infective tonsillitis 03/25/2013  . Adenomatous polyp 03/25/2013    Past Surgical History:  Procedure Laterality Date  . BACK SURGERY     Kyphoplasty   March 2018  . COLONOSCOPY    . ESOPHAGOGASTRODUODENOSCOPY (EGD) WITH PROPOFOL N/A 09/21/2016   Procedure: ESOPHAGOGASTRODUODENOSCOPY (EGD) WITH PROPOFOL;  Surgeon: Jonathon Bellows, MD;  Location: [facility] ENDOSCOPY;  Service: Gastroenterology;  Laterality: N/A;  . ESOPHAGOGASTRODUODENOSCOPY (EGD) WITH PROPOFOL N/A 11/07/2016   Procedure: ESOPHAGOGASTRODUODENOSCOPY (EGD) WITH PROPOFOL;  Surgeon: Jonathon Bellows, MD;  Location: [facility] ENDOSCOPY;  Service: Gastroenterology;  Laterality: N/A;  . HERNIA REPAIR     left inguinal hernia repair as an infant  . KYPHOPLASTY N/A 02/11/2016   Procedure: KYPHOPLASTY  L2,T9;  Surgeon: Hessie Knows, MD;  Location: [facility] ORS;  Service: Orthopedics;  Laterality: N/A;  . KYPHOPLASTY N/A 03/15/2016   Procedure: KYPHOPLASTY L3;  Surgeon: Hessie Knows, MD;  Location: [facility] ORS;  Service: Orthopedics;  Laterality: N/A;  . skin cancer removed    . TONSILLECTOMY      Prior to Admission medications   Medication Sig Start Date End Date Taking? Authorizing Provider  Amantadine HCl ER (GOCOVRI) 137 MG CP24 Take  2 capsules by mouth daily. 12/29/16 03/29/17 Yes [provider]  baclofen (LIORESAL) 10 MG tablet Take 10 mg by mouth daily.  10/03/16  Yes [provider]  carbidopa-levodopa (SINEMET IR) 25-100 MG tablet TK 1 T PO four times a day 08/16/16  Yes [provider]  citalopram (CELEXA) 40 MG tablet Take 40 mg by mouth daily.   Yes [provider]  hydrochlorothiazide (HYDRODIURIL) 25 MG tablet Take by mouth.   Yes [provider]  lisinopril  (PRINIVIL,ZESTRIL) 20 MG tablet Take 20 mg by mouth daily.    Yes [provider]  omeprazole (PRILOSEC) 20 MG capsule Take 20 mg by mouth daily.  10/01/16  Yes [provider]  pantoprazole (PROTONIX) 40 MG tablet Take 1 tablet (40 mg total) by mouth 2 (two) times daily. 09/22/16  Yes Max Sane, MD  potassium chloride SA (K-DUR,KLOR-CON) 20 MEQ tablet Take 20 mEq by mouth 2 (two) times daily. 06/29/16  Yes [provider]  sertraline (ZOLOFT) 50 MG tablet Take 1 tablet (50 mg total) by mouth daily. 08/08/16  Yes Rainey Pines, MD  simvastatin (ZOCOR) 20 MG tablet Take 20 mg by mouth at bedtime. Reported on 04/14/2015   Yes [provider]  sucralfate (CARAFATE) 1 g tablet Take 1 tablet (1 g total) by mouth 4 (four) times daily. 09/22/16  Yes Max Sane, MD  traZODone (DESYREL) 100 MG tablet Take 100 mg by mouth at bedtime.    Yes [provider]  Vitamin D, Ergocalciferol, (DRISDOL) 50000 units CAPS capsule Take 1 capsule by mouth once a week.   Yes [provider]  traMADol (ULTRAM) 50 MG tablet Take 1 tablet (50 mg total) by mouth every 6 (six) hours as needed. 01/06/17 01/06/18  Lavonia Drafts, MD     Allergies Amantadine  Family History  Problem Relation Age of Onset  . Heart Problems Mother   . Dementia Mother     Social History Social History   Tobacco Use  . Smoking status: Never Smoker  . Smokeless tobacco: Never Used  Substance Use Topics  . Alcohol use: Yes    Alcohol/week: 1.2 oz    Types: 2 Cans of beer per week    Comment: social  . Drug use: No    Review of Systems  Constitutional: No dizziness Eyes: No visual changes.  ENT: Neck pain as above Cardiovascular: Denies chest pain. Respiratory: Denies shortness of breath. Gastrointestinal: No abdominal pain.   Genitourinary: Negative for dysuria. Musculoskeletal: As above Skin: Negative for laceration Neurological: Negative for neuro  deficits   ____________________________________________   PHYSICAL EXAM:  VITAL SIGNS: ED Triage Vitals  Enc Vitals Group     BP 01/06/17 0949 102/62     Pulse Rate 01/06/17 0949 (!) 106     Resp 01/06/17 0949 16     Temp 01/06/17 0949 98.1 F (36.7 C)     Temp Source 01/06/17 0949 Oral     SpO2 01/06/17 0949 96 %     Weight --      Height --      Head Circumference --      Peak Flow --      Pain Score 01/06/17 0947 6     Pain Loc --      Pain Edu? --      Excl. in [place]? --     Constitutional: Alert and oriented. No acute distress. Pleasant and interactive Eyes: Conjunctivae are normal.  Head: Atraumatic.  No  hematoma Nose: No congestion/rhinnorhea. Mouth/Throat: Mucous membranes are moist.   Neck: Tenderness palpation along the left trapezius muscle, no vertebral tenderness palpation Cardiovascular: Normal rate, regular rhythm.   Good peripheral circulation. Respiratory: Normal respiratory effort.  No retractions.. Gastrointestinal: Soft and nontender. No distention.  Genitourinary: deferred Musculoskeletal: Full passive range of motion of the upper and lower extreme knees without pain.  He does have some chronic weakness along the left side.  Warm and well perfused.  Back: No vertebral tenderness to palpation although mildly tender at approximately T11 along the left paraspinal muscle Neurologic:  Normal speech and language. No gross focal neurologic deficits are appreciated.  Skin:  Skin is warm, dry and intact. No rash noted. Psychiatric: Mood and affect are normal. Speech and behavior are normal.  ____________________________________________   LABS (all labs ordered are listed, but only abnormal results are displayed)  Labs Reviewed - No data to display ____________________________________________  EKG  None ____________________________________________  RADIOLOGY  CT scans of the head and cervical spine unremarkable X-rays of the lumbar and thoracic spine  demonstrate old compression fractures ____________________________________________   PROCEDURES  Procedure(s) performed: No  Procedures   Critical Care performed: No ____________________________________________   INITIAL IMPRESSION / ASSESSMENT AND PLAN / ED COURSE  Pertinent labs & imaging results that were available during my care of the patient were reviewed by me and considered in my medical decision making (see chart for details).  Patient presents for fall.  Primary complaint is left-sided neck pain as well as thoracic back pain.  Suspect cervical sprain.  Will image head neck and spine.  I do not feel there is an indication for lab work at this time as patient has chronic balance issues  X-rays are reassuring, patient had relief from 30 of IM Toradol.  Will discharge with outpatient follow-up    ____________________________________________   FINAL CLINICAL IMPRESSION(S) / ED DIAGNOSES  Final diagnoses:  Fall, initial encounter  Cervical sprain, initial encounter  Contusion of back, unspecified laterality, initial encounter        Note:  This document was prepared using Dragon voice recognition software and may include unintentional dictation errors.    Lavonia Drafts, MD 01/06/17 1228

## 2017-01-06 NOTE — ED Triage Notes (Addendum)
Pt to ED via EMS from home, pt had mechanical fall today and stayed down for aprox 1hr, pt was found in plastic container by ems. PT hx of parkinson's, pt ambulates with walker at home. Pt c/o back and neck pain. Pt A&OX4, VS stable

## 2017-01-06 NOTE — ED Notes (Signed)
Patient transported to CT 

## 2017-01-06 NOTE — ED Notes (Signed)
Family at bedside. 

## 2017-01-06 NOTE — ED Notes (Signed)
ED Provider at bedside. 

## 2017-01-06 NOTE — ED Notes (Signed)
c collar applied at this time.

## 2017-01-09 ENCOUNTER — Telehealth: Payer: Self-pay

## 2017-01-09 ENCOUNTER — Ambulatory Visit: Payer: Federal, State, Local not specified - PPO | Admitting: Gastroenterology

## 2017-01-09 NOTE — Telephone Encounter (Signed)
LVM for patient callback. Labs not completed from 11/2 order.   Per Dr. Vicente Males, labs need to be completed prior to office visit.

## 2017-01-09 NOTE — Progress Notes (Deleted)
He is here today for a hospital follow up .   Summary of history : He was admitted on 09/20/16 for hematemesis. He has a history of liver cirrhosis. Consumes 2-3 glasses of bourbon a day for many years. Ct abdomen 07/10/16- shows cirrhosis ,hepatic steatosis , compression fractures l1,l5,rib fractures. Moderate hiatal hernia. 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 I performed an EGD which showed severe esophagitis LA grade C ,gastric ulcer non bleeding . He was discharged on PPI.09/2016- H pylori stool antigen was negative  Interval history   10/09/2016-01/10/16   11/2016- EGD- healing mucosal injury, GE junction bx showed inflammation, LA grade C esophagitis  On prilosec since discharge , doing well, no hematemesis. Stool is brown. He is having issues with chewing his food due to poor dentition. Says he has a poor apetite. He has not lost any weight he states.     Charles Stanton is a 62 y.o. y/o male h with a history of cirrhosis was recently admitted with hematemesis to the hospital, found to have acute severe esophagitis .Incidentally noted to have elevated alkaline phosphatase   Plan 1. Continue PPI 2. Repeat EGD in 6 weeks to check for healing 3. CMP,Fractionated alkaline phosphatase, Vitamin D level. CBC 4. Advised to seek a dentist for poor dentition  I have discussed alternative options, risks & benefits,  which include, but are not limited to, bleeding, infection, perforation,respiratory complication & drug reaction.  The patient agrees with this plan & written consent will be obtained.

## 2017-01-16 ENCOUNTER — Ambulatory Visit: Admission: RE | Admit: 2017-01-16 | Payer: Federal, State, Local not specified - PPO | Source: Ambulatory Visit

## 2017-01-17 ENCOUNTER — Ambulatory Visit
Admission: RE | Admit: 2017-01-17 | Discharge: 2017-01-17 | Disposition: A | Discharge status: Home / Self Care | Payer: Medicare Other | Source: Ambulatory Visit | Attending: Gastroenterology | Admitting: Gastroenterology

## 2017-01-17 DIAGNOSIS — K449 Diaphragmatic hernia without obstruction or gangrene: Secondary | ICD-10-CM | POA: Insufficient documentation

## 2017-01-17 DIAGNOSIS — K760 Fatty (change of) liver, not elsewhere classified: Secondary | ICD-10-CM | POA: Diagnosis not present

## 2017-01-17 DIAGNOSIS — R945 Abnormal results of liver function studies: Secondary | ICD-10-CM | POA: Diagnosis present

## 2017-01-17 LAB — POCT I-STAT CREATININE: Creatinine, Ser: 0.7 mg/dL (ref 0.61–1.24)

## 2017-01-17 MED ORDER — GADOBENATE DIMEGLUMINE 529 MG/ML IV SOLN
15.0000 mL | Freq: Once | INTRAVENOUS | Status: AC | PRN
  Administered 2017-01-17: 12 mL via INTRAVENOUS

## 2017-01-18 ENCOUNTER — Encounter: Payer: Self-pay | Admitting: Gastroenterology

## 2017-01-18 ENCOUNTER — Ambulatory Visit: Payer: Federal, State, Local not specified - PPO | Admitting: Gastroenterology

## 2017-01-20 ENCOUNTER — Telehealth: Payer: Self-pay | Admitting: Gastroenterology

## 2017-01-20 NOTE — Telephone Encounter (Signed)
Patient was unable to make office visit appt due to back injury sustained from a fall.   Patient is requesting a message be sent to Dr. Vicente Males for follow-up without an office visit.   Sent message to Dr. Vicente Males.

## 2017-01-20 NOTE — Telephone Encounter (Signed)
Please call patient regarding labs

## 2017-01-23 ENCOUNTER — Telehealth: Payer: Self-pay

## 2017-01-23 DIAGNOSIS — R945 Abnormal results of liver function studies: Secondary | ICD-10-CM

## 2017-01-23 DIAGNOSIS — R7989 Other specified abnormal findings of blood chemistry: Secondary | ICD-10-CM

## 2017-01-23 NOTE — Telephone Encounter (Signed)
LVM for patient callback for response from Dr. Vicente Males concerning results and labs.    - 1. MRCP showed only fatty liver and hiatal hernia-   2. He needs to get the labs that we ordered for abnormal LFT's .   3. Suggest follow up after blood work over the next few weeks

## 2017-01-23 NOTE — Telephone Encounter (Signed)
Advised patient of results per Dr. Vicente Males.    - 1. MRCP showed only fatty liver and hiatal hernia-   2. He needs to get the labs that we ordered for abnormal LFT's .   3. Suggest follow up after blood work over the next few weeks   Patient wants labs resulted at [facility] in [place].   Requesting fatty liver diet be mailed to him.    - Patient to callback after labs to schedule follow-up appointment.

## 2017-01-23 NOTE — Telephone Encounter (Signed)
-----   Message from Jonathon Bellows, MD sent at 01/22/2017 10:04 AM EST ----- Regarding: RE: Fall Injury 1. MRCP showed only fatty liver and hiatal hernia-  2. He needs to get the labs that we ordered for abnormal LFT's .   3. Suggest follow up after blood work over the next few weeks   ----- Message ----- From: Leontine Locket, CMA Sent: 01/20/2017   3:28 PM To: Jonathon Bellows, MD Subject: Fall Injury                                    [name] fell and hurt his back causing a missed appointment.   He would like to know his MRCP results and what he can do to follow-up from home. He's currently immobile.  Please advise.  Charles Stanton C.

## 2017-01-26 ENCOUNTER — Emergency Department: Payer: Medicare Other

## 2017-01-26 ENCOUNTER — Other Ambulatory Visit: Payer: Self-pay

## 2017-01-26 ENCOUNTER — Emergency Department
Admission: EM | Admit: 2017-01-26 | Discharge: 2017-01-26 | Disposition: A | Discharge status: Home / Self Care | Payer: Medicare Other | Attending: Emergency Medicine | Admitting: Emergency Medicine

## 2017-01-26 DIAGNOSIS — Z7982 Long term (current) use of aspirin: Secondary | ICD-10-CM | POA: Insufficient documentation

## 2017-01-26 DIAGNOSIS — Y998 Other external cause status: Secondary | ICD-10-CM | POA: Insufficient documentation

## 2017-01-26 DIAGNOSIS — Z85828 Personal history of other malignant neoplasm of skin: Secondary | ICD-10-CM | POA: Diagnosis not present

## 2017-01-26 DIAGNOSIS — S32000A Wedge compression fracture of unspecified lumbar vertebra, initial encounter for closed fracture: Secondary | ICD-10-CM | POA: Diagnosis not present

## 2017-01-26 DIAGNOSIS — Y9389 Activity, other specified: Secondary | ICD-10-CM | POA: Diagnosis not present

## 2017-01-26 DIAGNOSIS — F419 Anxiety disorder, unspecified: Secondary | ICD-10-CM | POA: Diagnosis not present

## 2017-01-26 DIAGNOSIS — W010XXA Fall on same level from slipping, tripping and stumbling without subsequent striking against object, initial encounter: Secondary | ICD-10-CM | POA: Insufficient documentation

## 2017-01-26 DIAGNOSIS — M5124 Other intervertebral disc displacement, thoracic region: Secondary | ICD-10-CM | POA: Diagnosis not present

## 2017-01-26 DIAGNOSIS — I10 Essential (primary) hypertension: Secondary | ICD-10-CM | POA: Insufficient documentation

## 2017-01-26 DIAGNOSIS — S22080A Wedge compression fracture of T11-T12 vertebra, initial encounter for closed fracture: Secondary | ICD-10-CM | POA: Diagnosis not present

## 2017-01-26 DIAGNOSIS — S3992XA Unspecified injury of lower back, initial encounter: Secondary | ICD-10-CM | POA: Diagnosis present

## 2017-01-26 DIAGNOSIS — Y92002 Bathroom of unspecified non-institutional (private) residence single-family (private) house as the place of occurrence of the external cause: Secondary | ICD-10-CM | POA: Insufficient documentation

## 2017-01-26 DIAGNOSIS — F329 Major depressive disorder, single episode, unspecified: Secondary | ICD-10-CM | POA: Diagnosis not present

## 2017-01-26 DIAGNOSIS — Z79899 Other long term (current) drug therapy: Secondary | ICD-10-CM | POA: Diagnosis not present

## 2017-01-26 DIAGNOSIS — G20 Parkinson's disease: Secondary | ICD-10-CM | POA: Diagnosis not present

## 2017-01-26 DIAGNOSIS — S22000A Wedge compression fracture of unspecified thoracic vertebra, initial encounter for closed fracture: Secondary | ICD-10-CM

## 2017-01-26 LAB — COMPREHENSIVE METABOLIC PANEL
ALBUMIN: 3.1 g/dL — AB (ref 3.5–5.0)
ALT: 51 U/L (ref 17–63)
ANION GAP: 12 (ref 5–15)
AST: 162 U/L — ABNORMAL HIGH (ref 15–41)
Alkaline Phosphatase: 338 U/L — ABNORMAL HIGH (ref 38–126)
BILIRUBIN TOTAL: 1.7 mg/dL — AB (ref 0.3–1.2)
BUN: 7 mg/dL (ref 6–20)
CO2: 28 mmol/L (ref 22–32)
Calcium: 8.6 mg/dL — ABNORMAL LOW (ref 8.9–10.3)
Chloride: 95 mmol/L — ABNORMAL LOW (ref 101–111)
Creatinine, Ser: 0.43 mg/dL — ABNORMAL LOW (ref 0.61–1.24)
GFR calc non Af Amer: >60 mL/min (ref >60)
Glucose, Bld: 105 mg/dL — ABNORMAL HIGH (ref 65–99)
POTASSIUM: 3.2 mmol/L — AB (ref 3.5–5.1)
Sodium: 135 mmol/L (ref 135–145)
TOTAL PROTEIN: 6.7 g/dL (ref 6.5–8.1)

## 2017-01-26 LAB — CBC
HCT: 44.7 % (ref 40.0–52.0)
HEMOGLOBIN: 15.4 g/dL (ref 13.0–18.0)
MCH: 36.3 pg — ABNORMAL HIGH (ref 26.0–34.0)
MCHC: 34.5 g/dL (ref 32.0–36.0)
MCV: 105.3 fL — ABNORMAL HIGH (ref 80.0–100.0)
Platelets: 204 10*3/uL (ref 150–440)
RBC: 4.24 MIL/uL — ABNORMAL LOW (ref 4.40–5.90)
RDW: 15.4 % — ABNORMAL HIGH (ref 11.5–14.5)
WBC: 7.4 10*3/uL (ref 3.8–10.6)

## 2017-01-26 LAB — ETHANOL: ALCOHOL ETHYL (B): 271 mg/dL — AB (ref <10)

## 2017-01-26 MED ORDER — TRAMADOL HCL 50 MG PO TABS
50.0000 mg | ORAL_TABLET | ORAL | Status: AC
Start: 2017-01-26 — End: 2017-01-26
  Administered 2017-01-26: 50 mg via ORAL
  Filled 2017-01-26: qty 1

## 2017-01-26 MED ORDER — PREDNISONE 20 MG PO TABS: 40.0000 mg | ORAL_TABLET | Freq: Every day | ORAL | 0 refills | Status: DC

## 2017-01-26 MED ORDER — PREDNISONE 20 MG PO TABS
40.0000 mg | ORAL_TABLET | Freq: Once | ORAL | Status: AC
  Administered 2017-01-26: 40 mg via ORAL
  Filled 2017-01-26: qty 2

## 2017-01-26 NOTE — ED Notes (Signed)
Pt taken to MRI in 19H bed  NAD assessed

## 2017-01-26 NOTE — ED Notes (Signed)
Patient transported to [facility] 

## 2017-01-26 NOTE — ED Notes (Signed)
Pt in NAD at time of departure, VS stable for pt, MD aware. Pt in wheelchair. Pt and spouse verbalize d/c teaching and follow up.

## 2017-01-26 NOTE — ED Notes (Signed)
Bio tech called at this time for TLSO brace

## 2017-01-26 NOTE — ED Notes (Signed)
Bio tech at bedside for TLSO brace

## 2017-01-26 NOTE — ED Triage Notes (Addendum)
He arrives today with reports of "taking too many meds last night with too much bourbon."  He was seen here last week for a fall - reports he went to his knees this am  Pt reports "numbness" in his right leg  Ambulates with a walker at home

## 2017-01-26 NOTE — ED Provider Notes (Addendum)
[facility] Emergency Department Provider Note   ____________________________________________   First MD Initiated Contact with Patient 01/26/17 1104     (approximate)  I have reviewed the triage vital signs and the nursing notes.   HISTORY  Chief Complaint Alcohol Intoxication and Leg Pain  HPI Charles Stanton is a 62 y.o. male with a history of Parkinson's disease, alcohol abuse, and a recent fall with compression fractures  Patient reports that about 2 weeks ago he fell next to the bathtub.  Since that time he is having numbness and weakness in his right foot that is making it hard for him to walk.  He is also had some slight pain across his lower foot, but reports primarily is very numb and feels like the right foot is weak making it hard for him to walk on.  He does have Parkinson's, and a prescription for tramadol which he was taking for lower back pain which she reports is been steady since a fall about 2 weeks ago.  He did use alcohol this morning and last night, and his wife reports that he has been stumbling because of difficulty with walking.  No head strike.  No neck pain.  No chest pain or trouble breathing.  He reports a persistent numbness across the foot and ankle region more on the inside of his foot than on the outside with difficulty wiggling his toes and moving his ankle that seems to be getting slowly worse over the last couple of weeks.  Past Medical History:  Diagnosis Date  . Abnormal liver function   . Anemia   . Anxiety   . Colonic mass   . Depression   . Dizziness   . Dry cough   . History of colon polyps   . Hypercholesteremia   . Hyperlipemia   . Hypertension   . Palpitations   . Parkinson's disease ([place])   . Parkinson's disease ([place])   . PVD (peripheral vascular disease) ([place])   . Tremors of nervous system     Patient Active Problem List   Diagnosis Date Noted  . Esophageal obstruction 10/10/2016  . Esophageal  ulceration 10/10/2016  . GI bleed 09/20/2016  . Low vitamin B12 level 03/18/2016  . S/P kyphoplasty 03/18/2016  . Basal cell carcinoma 02/08/2016  . Atrial fibrillation ([place]) 01/27/2016  . Colitis 01/27/2016  . HLD (hyperlipidemia) 01/27/2016  . Anxiety 01/27/2016  . Hypokalemia 01/27/2016  . Major depressive disorder, recurrent episode, moderate ([place]) 04/14/2015  . Anxiety, generalized 12/06/2013  . Parkinson disease ([place]) 08/07/2013  . Dysphagia 05/12/2013  . Esophageal mass 05/12/2013  . GERD (gastroesophageal reflux disease) 05/12/2013  . Vomiting 05/12/2013  . Open bite of lower leg 05/10/2013  . Dog bite of lower leg 05/10/2013  . Abnormal finding on liver function 03/25/2013  . Benign neoplasm 03/25/2013  . Dizziness 03/25/2013  . Type 2 diabetes mellitus ([place]) 03/25/2013  . BP (high blood pressure) 03/25/2013  . Awareness of heartbeats 03/25/2013  . Pure hypercholesterolemia 03/25/2013  . Infective tonsillitis 03/25/2013  . Adenomatous polyp 03/25/2013    Past Surgical History:  Procedure Laterality Date  . BACK SURGERY     Kyphoplasty   March 2018  . COLONOSCOPY    . ESOPHAGOGASTRODUODENOSCOPY (EGD) WITH PROPOFOL N/A 09/21/2016   Procedure: ESOPHAGOGASTRODUODENOSCOPY (EGD) WITH PROPOFOL;  Surgeon: Jonathon Bellows, MD;  Location[facility] ENDOSCOPY;  Service: Gastroenterology;  Laterality: N/A;  . ESOPHAGOGASTRODUODENOSCOPY (EGD) WITH PROPOFOL N/A 11/07/2016   Procedure: ESOPHAGOGASTRODUODENOSCOPY (EGD)  WITH PROPOFOL;  Surgeon: Jonathon Bellows, MD;  Location: [facility] ENDOSCOPY;  Service: Gastroenterology;  Laterality: N/A;  . HERNIA REPAIR     left inguinal hernia repair as an infant  . KYPHOPLASTY N/A 02/11/2016   Procedure: KYPHOPLASTY  L2,T9;  Surgeon: Hessie Knows, MD;  Location: [facility] ORS;  Service: Orthopedics;  Laterality: N/A;  . KYPHOPLASTY N/A 03/15/2016   Procedure: KYPHOPLASTY L3;  Surgeon: Hessie Knows, MD;  Location: [facility] ORS;  Service: Orthopedics;  Laterality: N/A;  .  skin cancer removed    . TONSILLECTOMY      Prior to Admission medications   Medication Sig Start Date End Date Taking? Authorizing Provider  Amantadine HCl ER (GOCOVRI) 137 MG CP24 Take 2 capsules by mouth daily. 12/29/16 03/29/17 Yes [provider]  aspirin EC 81 MG tablet Take 81 mg by mouth daily.   Yes [provider]  baclofen (LIORESAL) 10 MG tablet Take 10 mg by mouth daily.  10/03/16  Yes [provider]  carbidopa-levodopa (SINEMET CR) 50-200 MG tablet Take 1 tablet by mouth at bedtime.   Yes [provider]  carbidopa-levodopa (SINEMET IR) 25-100 MG tablet TK 1 T PO four times a day 08/16/16  Yes [provider]  cyclobenzaprine (FLEXERIL) 10 MG tablet Take by mouth. 05/18/16  Yes [provider]  gabapentin (NEURONTIN) 300 MG capsule TAKE 1 CAPSULE(300 MG) BY MOUTH THREE TIMES DAILY 08/02/16  Yes [provider]  hydrochlorothiazide (HYDRODIURIL) 25 MG tablet Take by mouth.   Yes [provider]  lisinopril (PRINIVIL,ZESTRIL) 20 MG tablet Take 20 mg by mouth daily.    Yes [provider]  omeprazole (PRILOSEC) 20 MG capsule Take 20 mg by mouth daily.  10/01/16  Yes [provider]  pantoprazole (PROTONIX) 40 MG tablet Take 1 tablet (40 mg total) by mouth 2 (two) times daily. 09/22/16  Yes Max Sane, MD  potassium chloride SA (K-DUR,KLOR-CON) 20 MEQ tablet Take 20 mEq by mouth 2 (two) times daily. 06/29/16  Yes [provider]  sertraline (ZOLOFT) 50 MG tablet Take 1 tablet (50 mg total) by mouth daily. 08/08/16  Yes Rainey Pines, MD  simvastatin (ZOCOR) 20 MG tablet Take 20 mg by mouth at bedtime. Reported on 04/14/2015   Yes [provider]  sucralfate (CARAFATE) 1 g tablet Take 1 tablet (1 g total) by mouth 4 (four) times daily. 09/22/16  Yes Max Sane, MD  traMADol (ULTRAM) 50 MG tablet Take 1 tablet (50 mg total) by mouth every 6 (six) hours as needed. 01/06/17 01/06/18 Yes Lavonia Drafts, MD  vitamin B-12 (CYANOCOBALAMIN) 1000 MCG tablet Take by mouth. 06/29/16 06/29/17 Yes [provider]    Allergies Amantadine  Family History  Problem Relation Age of Onset  . Heart Problems Mother   . Dementia Mother     Social History Social History   Tobacco Use  . Smoking status: Never Smoker  . Smokeless tobacco: Never Used  Substance Use Topics  . Alcohol use: Yes    Alcohol/week: 1.2 oz    Types: 2 Cans of beer per week    Comment: social  . Drug use: No    Review of Systems Constitutional: No fever/chills Eyes: No visual changes. ENT: No sore throat. Cardiovascular: Denies chest pain. Respiratory: Denies shortness of breath. Gastrointestinal: No abdominal pain.  No nausea, no vomiting.  No diarrhea.  No constipation. Genitourinary: Negative for dysuria. Musculoskeletal: Moderate pain in the very low portion of his back.  Ongoing for  about 2 weeks Skin: Negative for rash. Neurological: Negative for headaches.  Weakness in the ankle foot and toes of the right foot with numbness in the same area.  No pain in the hips.  Denies pain in the knees.  No hip pain.    ____________________________________________   PHYSICAL EXAM:  VITAL SIGNS: ED Triage Vitals  Enc Vitals Group     BP 01/26/17 1033 125/88     Pulse Rate 01/26/17 1033 (!) 106     Resp 01/26/17 1033 20     Temp 01/26/17 1033 98.1 F (36.7 C)     Temp Source 01/26/17 1033 Oral     SpO2 01/26/17 1033 95 %     Weight 01/26/17 1034 140 lb (63.5 kg)     Height 01/26/17 1034 5\' 6"  (1.676 m)     Head Circumference --      Peak Flow --      Pain Score 01/26/17 1044 5     Pain Loc --      Pain Edu? --      Excl. in [place]? --     Constitutional: Alert and oriented. Well appearing and in no acute distress.  Parkinsonian appearance.  Somewhat underweight. Eyes: Conjunctivae are normal. Head: Atraumatic. Nose: No congestion/rhinnorhea. Mouth/Throat: Mucous membranes are moist. Neck:  No stridor.   Cardiovascular: Normal rate, regular rhythm. Grossly normal heart sounds.  Good peripheral circulation. Respiratory: Normal respiratory effort.  No retractions. Lungs CTAB. Gastrointestinal: Soft and nontender. No distention. Musculoskeletal:   Lower Extremities  No edema. Normal DP/PT pulses bilateral with good cap refill.  Normal neuro-motor function lower extremities bilateral.  RIGHT Right lower extremity demonstrates normal strength, good use of all muscles except in the ankle and foot where he seems to have weakness with wiggling his toes and plantar and dorsiflexion, though it is not complete paralysis it does appear moderately weakened. No edema bruising or contusions of the right hip, right knee, right ankle there is slight swelling over the dorsum of the right foot without erythema or focal tenderness but the patient does report numbness over the medial aspect of the right foot. Full range of motion of the right lower extremity without pain. No pain on axial loading.   LEFT Left lower extremity demonstrates normal strength, good use of all muscles. No edema bruising or contusions of the hip,  knee, ankle. Full range of motion of the left lower extremity without pain. No pain on axial loading. No evidence of trauma.   Neurologic:  Normal speech and language. No gross focal neurologic deficits are appreciated.  Skin:  Skin is warm, dry and intact. No rash noted. Psychiatric: Mood and affect are normal. Speech and behavior are normal.  ____________________________________________   LABS (all labs ordered are listed, but only abnormal results are displayed)  Labs Reviewed  COMPREHENSIVE METABOLIC PANEL - Abnormal; Notable for the following components:      Result Value   Potassium 3.2 (*)    Chloride 95 (*)    Glucose, Bld 105 (*)    Creatinine, Ser 0.43 (*)    Calcium 8.6 (*)    Albumin 3.1 (*)    AST 162 (*)    Alkaline Phosphatase 338 (*)    Total  Bilirubin 1.7 (*)    All other components within normal limits  ETHANOL - Abnormal; Notable for the following components:   Alcohol, Ethyl (B) 271 (*)    All other components within normal limits  CBC - Abnormal;  Notable for the following components:   RBC 4.24 (*)    MCV 105.3 (*)    MCH 36.3 (*)    RDW 15.4 (*)    All other components within normal limits  URINE DRUG SCREEN, QUALITATIVE ([facility] ONLY)   ____________________________________________  EKG   ____________________________________________  RADIOLOGY  Dg Tibia/fibula Right  Result Date: 01/26/2017 CLINICAL DATA:  Fall.  Redness and swelling right lower extremity. EXAM: RIGHT TIBIA AND FIBULA - 2 VIEW COMPARISON:  None. FINDINGS: There is no evidence of fracture or other focal bone lesions. Soft tissues are unremarkable. IMPRESSION: Negative. Electronically Signed   By: Misty Stanley M.D.   On: 01/26/2017 12:10   Mr Lumbar Spine Wo Contrast  Result Date: 01/26/2017 CLINICAL DATA:  Fracture, back pain EXAM: MRI LUMBAR SPINE WITHOUT CONTRAST TECHNIQUE: Multiplanar, multisequence MR imaging of the lumbar spine was performed. No intravenous contrast was administered. COMPARISON:  CT abdomen pelvis 07/20/2016. Lumbar radiographs 01/06/2017 FINDINGS: Segmentation:  Normal Alignment: Mild anterolisthesis T12-L1. Mild retrolisthesis L1-2 and L2-3 Vertebrae:  Multiple fractures T11 mild fracture with bone marrow edema. This is new since the prior x-Michele. No retropulsion of bone into the canal T12 mild compression fracture with bone marrow edema. This was not present previously. L1 moderately severe compression fracture without retropulsion of bone into the canal. Mild bone marrow edema. This fracture is unchanged from the prior CT. Chronic fractures L2 and L3 with vertebroplasty cement. L5 moderate compression fracture which is unchanged and appearing chronic without bone marrow edema. Conus medullaris and cauda equina: Conus extends to the  L2-3 level. Conus and cauda equina appear normal. Paraspinal and other soft tissues: Negative for mass or adenopathy or hematoma Disc levels: T11-12: Central disc protrusion with moderate spinal stenosis based on sagittal images. Axial images not obtained through this level. T12-L1: Mild disc and facet spurring without stenosis L1-2: Mild disc and facet degeneration without stenosis L2-3: Mild retrolisthesis. Mild disc and facet degeneration with moderate foraminal stenosis bilaterally L3-4: Retrolisthesis with disc and facet degeneration. Moderate foraminal stenosis on the right L4-5: Disc and facet degeneration with mild spinal stenosis and mild foraminal stenosis bilaterally L5-S1: Disc and facet degeneration with mild foraminal narrowing bilaterally IMPRESSION: Multiple lumbar compression fractures. T11 and T12 fractures are new since recent x-rays and appear acute. Chronic fractures L1, L2, L3, L5 Moderate central disc protrusion T11-12 with spinal stenosis. Multilevel degenerative change and spurring as above. Electronically Signed   By: Franchot Gallo M.D.   On: 01/26/2017 13:08   Dg Foot Complete Right  Result Date: 01/26/2017 CLINICAL DATA:  62 year old male with history of multiple recent falls. Redness and swelling in the right foot. EXAM: RIGHT FOOT COMPLETE - 3+ VIEW COMPARISON:  No priors. FINDINGS: There is no evidence of fracture or dislocation. There is no evidence of arthropathy or other focal bone abnormality. Diffuse soft tissue swelling overlying the dorsal aspect of the forefoot. IMPRESSION: 1. No acute bony abnormality of the right foot. 2. Diffuse soft tissue swelling overlying the dorsal aspect of the forefoot. The Electronically Signed   By: Vinnie Langton M.D.   On: 01/26/2017 12:11    ____________________________________________   PROCEDURES  Procedure(s) performed: None  Procedures  Critical Care performed: No  ____________________________________________   INITIAL  IMPRESSION / ASSESSMENT AND PLAN / ED COURSE  Pertinent labs & imaging results that were available during my care of the patient were reviewed by me and considered in my medical decision making (see chart for details).  Patient presents for ongoing low back pain associated now with weakness and numbness across the inner portion of the right foot.  He had a recent compression fracture, and is reporting worsening symptoms of weakness and numbness in the right foot.  Denies symptoms or pain in the thoracic region, reports pain in the mid upper lumbar region.  No step-offs or deformities, he denies changes in bowel or bladder habits.  He does have loss of sensation across the inner right foot however, given this finding possibly is having spinal injury secondary to his compression fracture, no evidence of an obvious trauma to the actual foot or ankle.    Clinical Course as of Jan 26 1630  Thu Jan 26, 2017  1432 Spoke with Dr. Cari Caraway, advises may discharge with clinic appointment and steroid burst. Obtain TLSO from [employer] and follow-up tomorrow in Dr. Nelly Laurence clinic.   [MQ]    Clinical Course User Index [MQ] Delman Kitten, MD   ----------------------------------------- 4:31 PM on 01/26/2017 -----------------------------------------  Patient fitted with TLSO brace.  Discussed with patient and his wife, both are comfortable with plan for follow-up in the neurosurgery clinic tomorrow.  He is aware of the plan, his wife will be assisting him at home and at this point I feel he is stable for discharge.  I will place him on additional prednisone for the next 4 days as a steroid burst, and he has appropriate follow-up arranged.  Patient awake alert in no distress.  Appears appropriate for outpatient ongoing care with Dr. Cari Caraway.  Discussed with patient, his wife drives him.  He is awake and oriented.  No signs of ongoing intoxication. ____________________________________________   FINAL  CLINICAL IMPRESSION(S) / ED DIAGNOSES  Final diagnoses:  Protrusion of thoracic intervertebral disc  Lumbar compression fracture, closed, initial encounter ([place])  Thoracic compression fracture, closed, initial encounter ([place])      NEW MEDICATIONS STARTED DURING THIS VISIT:  New Prescriptions   No medications on file     Note:  This document was prepared using Dragon voice recognition software and may include unintentional dictation errors.     Delman Kitten, MD 01/26/17 7829    Delman Kitten, MD 01/26/17 [phone]

## 2017-01-26 NOTE — Discharge Instructions (Signed)
Please follow-up with neurosurgery, Dr. Nelly Laurence office tomorrow.  Please call in the morning, they should be able to work you into the schedule.  Please wear your brace at all times when you are out of bed except you may take it off to shower.

## 2017-03-06 ENCOUNTER — Telehealth: Payer: Self-pay

## 2017-03-06 NOTE — Telephone Encounter (Signed)
LVM for patient callback for results per Dr. Vicente Males.    - Inform biopsies taken from esophagus during EGD show inflammation. Ensure he is on Prilosec 40 mg once a day and when he is ready (recovered from issues from, his back) needs anm EGD to ensure healing of the severe esophagitis

## 2017-05-11 IMAGING — XA DG LUMBAR SPINE 2-3V
1 series · 1 of 1 positions shown · non-contrast
Comparison: Intraoperative fluoroscopic radiographs dated
02/11/2016

CLINICAL DATA: L3 kyphoplasty

EXAM:
LUMBAR SPINE - 2-3 VIEW; DG C-ARM 61-120 MIN
FLUOROSCOPY TIME:  10.3 mGy
2 minutes 58 seconds

[Series 1: ortho standard · 1 of 1 slices shown]
[im 1/1]
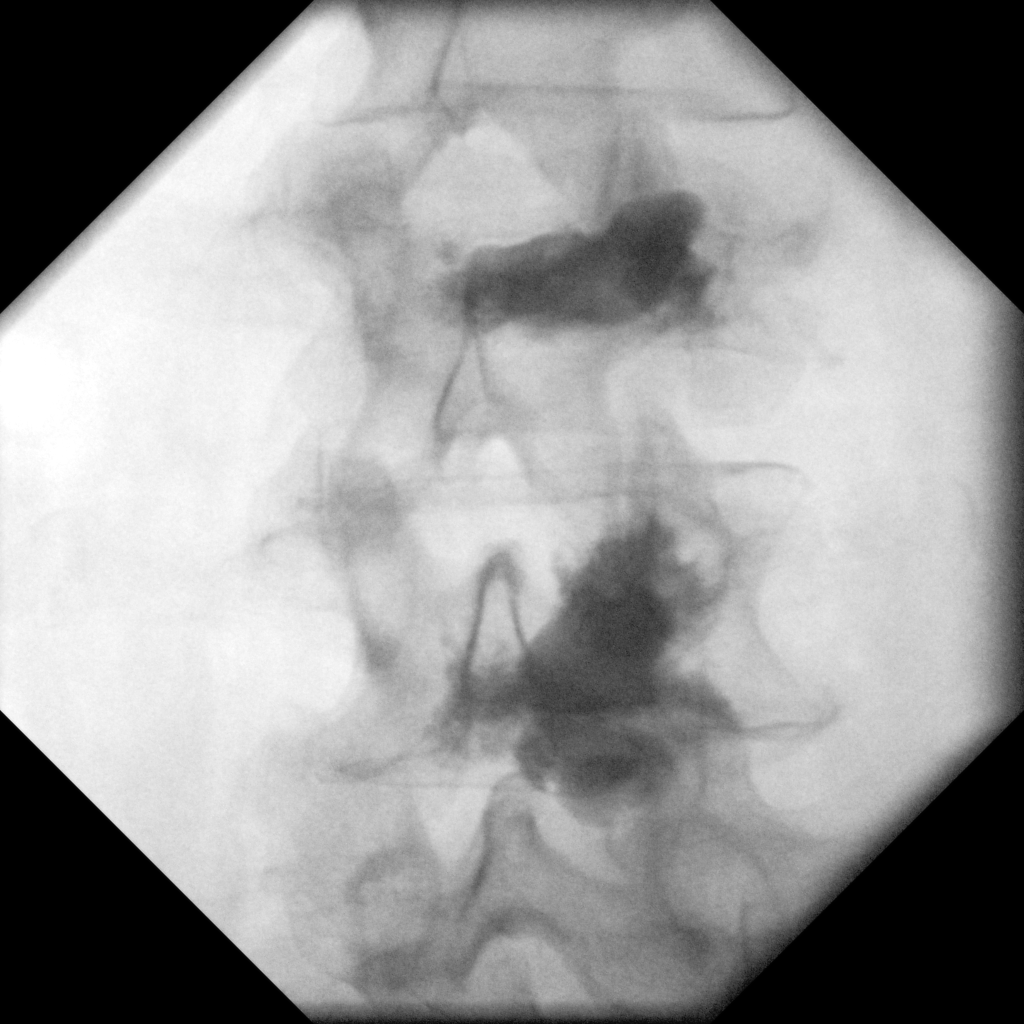

[1 of 1 positions shown; findings below may reference images not displayed]

FINDINGS: Frontal and lateral fluoroscopic radiographs during L3 kyphoplasty.
Vertebral cement extends into the L3-4 disc space.

Prior L2 kyphoplasty.
IMPRESSION: Intraoperative fluoroscopic radiographs during L3 kyphoplasty, as
above.

## 2017-05-27 ENCOUNTER — Emergency Department
Admission: EM | Admit: 2017-05-27 | Discharge: 2017-05-27 | Disposition: A | Discharge status: Home / Self Care | Payer: Medicare Other | Attending: Emergency Medicine | Admitting: Emergency Medicine

## 2017-05-27 ENCOUNTER — Emergency Department: Payer: Medicare Other

## 2017-05-27 ENCOUNTER — Encounter: Payer: Self-pay | Admitting: Emergency Medicine

## 2017-05-27 ENCOUNTER — Other Ambulatory Visit: Payer: Self-pay

## 2017-05-27 DIAGNOSIS — R0789 Other chest pain: Secondary | ICD-10-CM | POA: Insufficient documentation

## 2017-05-27 DIAGNOSIS — G20 Parkinson's disease: Secondary | ICD-10-CM | POA: Diagnosis not present

## 2017-05-27 DIAGNOSIS — I10 Essential (primary) hypertension: Secondary | ICD-10-CM | POA: Diagnosis not present

## 2017-05-27 DIAGNOSIS — K449 Diaphragmatic hernia without obstruction or gangrene: Secondary | ICD-10-CM

## 2017-05-27 DIAGNOSIS — R079 Chest pain, unspecified: Secondary | ICD-10-CM

## 2017-05-27 LAB — CBC
HCT: 49.7 % (ref 40.0–52.0)
Hemoglobin: 17 g/dL (ref 13.0–18.0)
MCH: 34.5 pg — AB (ref 26.0–34.0)
MCHC: 34.2 g/dL (ref 32.0–36.0)
MCV: 100.9 fL — AB (ref 80.0–100.0)
PLATELETS: 182 10*3/uL (ref 150–440)
RBC: 4.92 MIL/uL (ref 4.40–5.90)
RDW: 13.9 % (ref 11.5–14.5)
WBC: 4.8 10*3/uL (ref 3.8–10.6)

## 2017-05-27 LAB — COMPREHENSIVE METABOLIC PANEL
ALT: 29 U/L (ref 17–63)
ANION GAP: 9 (ref 5–15)
AST: 92 U/L — ABNORMAL HIGH (ref 15–41)
Albumin: 3.8 g/dL (ref 3.5–5.0)
Alkaline Phosphatase: 147 U/L — ABNORMAL HIGH (ref 38–126)
BILIRUBIN TOTAL: 1.2 mg/dL (ref 0.3–1.2)
BUN: <5 mg/dL — ABNORMAL LOW (ref 6–20)
CHLORIDE: 104 mmol/L (ref 101–111)
CO2: 27 mmol/L (ref 22–32)
Calcium: 8.9 mg/dL (ref 8.9–10.3)
Creatinine, Ser: 0.38 mg/dL — ABNORMAL LOW (ref 0.61–1.24)
GFR calc Af Amer: >60 mL/min (ref >60)
GLUCOSE: 102 mg/dL — AB (ref 65–99)
POTASSIUM: 3.2 mmol/L — AB (ref 3.5–5.1)
Sodium: 140 mmol/L (ref 135–145)
TOTAL PROTEIN: 7.2 g/dL (ref 6.5–8.1)

## 2017-05-27 LAB — TROPONIN I: Troponin I: <0.03 ng/mL (ref <0.03)

## 2017-05-27 NOTE — ED Notes (Signed)
NAD noted at time of D/C. Pt denies questions or concerns. Pt taken to the lobby via wheelchair at this time.  

## 2017-05-27 NOTE — ED Triage Notes (Signed)
Pt presents to ED via [facility] with c/o chest pain and weakness. Pt c/o L sided chest pain x 1-3 days. Per EMS pt has hx of parkinsons disease with tremors at baseline. Pt also c.o dysuria with hematuria. Per EMS pt drinks a little bit of whiskey and 2 beers daily but has not had any in 2 days. EMS reports that patient has not had lisinopril or potassium in 2 days. CBG 107, 124/72, 98 HR, 95% on RA.

## 2017-05-27 NOTE — ED Provider Notes (Signed)
[facility] Emergency Department Provider Note       Time seen: ----------------------------------------- 11:14 AM on 05/27/2017 -----------------------------------------   I have reviewed the triage vital signs and the nursing notes.  HISTORY   Chief Complaint Flank Pain and Weakness    HPI Charles Stanton is a 62 y.o. male with a history of abnormal liver function test, anemia, anxiety, Parkinson's disease, peripheral vascular disease, hyperlipidemia and hypertension who presents to the ED for his pain and weakness.  Patient describes left-sided chest pain and mainly pain across his shoulder blades for the past several days.  He also complains of some dysuria with hematuria.  Patient drinks alcohol daily but has not had any in 2 days.  He denies fevers, chills or other complaints.  Past Medical History:  Diagnosis Date  . Abnormal liver function   . Anemia   . Anxiety   . Colonic mass   . Depression   . Dizziness   . Dry cough   . History of colon polyps   . Hypercholesteremia   . Hyperlipemia   . Hypertension   . Palpitations   . Parkinson's disease ([place])   . Parkinson's disease ([place])   . PVD (peripheral vascular disease) ([place])   . Tremors of nervous system     Patient Active Problem List   Diagnosis Date Noted  . Esophageal obstruction 10/10/2016  . Esophageal ulceration 10/10/2016  . GI bleed 09/20/2016  . Low vitamin B12 level 03/18/2016  . S/P kyphoplasty 03/18/2016  . Basal cell carcinoma 02/08/2016  . Atrial fibrillation ([place]) 01/27/2016  . Colitis 01/27/2016  . HLD (hyperlipidemia) 01/27/2016  . Anxiety 01/27/2016  . Hypokalemia 01/27/2016  . Major depressive disorder, recurrent episode, moderate ([place]) 04/14/2015  . Anxiety, generalized 12/06/2013  . Parkinson disease ([place]) 08/07/2013  . Dysphagia 05/12/2013  . Esophageal mass 05/12/2013  . GERD (gastroesophageal reflux disease) 05/12/2013  . Vomiting 05/12/2013  . Open bite  of lower leg 05/10/2013  . Dog bite of lower leg 05/10/2013  . Abnormal finding on liver function 03/25/2013  . Benign neoplasm 03/25/2013  . Dizziness 03/25/2013  . Type 2 diabetes mellitus ([place]) 03/25/2013  . BP (high blood pressure) 03/25/2013  . Awareness of heartbeats 03/25/2013  . Pure hypercholesterolemia 03/25/2013  . Infective tonsillitis 03/25/2013  . Adenomatous polyp 03/25/2013    Past Surgical History:  Procedure Laterality Date  . BACK SURGERY     Kyphoplasty   March 2018  . COLONOSCOPY    . ESOPHAGOGASTRODUODENOSCOPY (EGD) WITH PROPOFOL N/A 09/21/2016   Procedure: ESOPHAGOGASTRODUODENOSCOPY (EGD) WITH PROPOFOL;  Surgeon: Jonathon Bellows, MD;  Location: [facility] ENDOSCOPY;  Service: Gastroenterology;  Laterality: N/A;  . ESOPHAGOGASTRODUODENOSCOPY (EGD) WITH PROPOFOL N/A 11/07/2016   Procedure: ESOPHAGOGASTRODUODENOSCOPY (EGD) WITH PROPOFOL;  Surgeon: Jonathon Bellows, MD;  Location: [facility] ENDOSCOPY;  Service: Gastroenterology;  Laterality: N/A;  . HERNIA REPAIR     left inguinal hernia repair as an infant  . KYPHOPLASTY N/A 02/11/2016   Procedure: KYPHOPLASTY  L2,T9;  Surgeon: Hessie Knows, MD;  Location: [facility] ORS;  Service: Orthopedics;  Laterality: N/A;  . KYPHOPLASTY N/A 03/15/2016   Procedure: KYPHOPLASTY L3;  Surgeon: Hessie Knows, MD;  Location: [facility] ORS;  Service: Orthopedics;  Laterality: N/A;  . skin cancer removed    . TONSILLECTOMY      Allergies Amantadine  Social History Social History   Tobacco Use  . Smoking status: Never Smoker  . Smokeless tobacco: Never Used  Substance Use Topics  . Alcohol  use: Yes    Alcohol/week: 1.2 oz    Types: 2 Cans of beer per week    Comment: Daily 2 beers and shot of whiskey  . Drug use: No   Review of Systems Constitutional: Negative for fever. Cardiovascular: Positive for chest pain Respiratory: Negative for shortness of breath. Gastrointestinal: Negative for abdominal pain, vomiting and diarrhea. Musculoskeletal:  Positive for back pain Skin: Negative for rash. Neurological: Negative for headaches, focal weakness or numbness.  All systems negative/normal/unremarkable except as stated in the HPI  ____________________________________________   PHYSICAL EXAM:  VITAL SIGNS: ED Triage Vitals  Enc Vitals Group     BP 05/27/17 1048 122/84     Pulse Rate 05/27/17 1048 97     Resp 05/27/17 1048 17     Temp 05/27/17 1048 98.5 F (36.9 C)     Temp Source 05/27/17 1048 Oral     SpO2 05/27/17 1047 95 %     Weight 05/27/17 1049 140 lb (63.5 kg)     Height 05/27/17 1049 5\' 6"  (1.676 m)     Head Circumference --      Peak Flow --      Pain Score 05/27/17 1049 6     Pain Loc --      Pain Edu? --      Excl. in [place]? --    Constitutional: Alert and oriented. Well appearing and in no distress. Eyes: Conjunctivae are normal. Normal extraocular movements. ENT   Head: Normocephalic and atraumatic.   Nose: No congestion/rhinnorhea.   Mouth/Throat: Mucous membranes are moist.   Neck: No stridor. Cardiovascular: Normal rate, regular rhythm. No murmurs, rubs, or gallops. Respiratory: Normal respiratory effort without tachypnea nor retractions. Breath sounds are clear and equal bilaterally. No wheezes/rales/rhonchi. Gastrointestinal: Soft and nontender. Normal bowel sounds Musculoskeletal: Nontender with normal range of motion in extremities. No lower extremity tenderness nor edema. Neurologic:  Normal speech and language. No gross focal neurologic deficits are appreciated.  Tremor is noted Skin:  Skin is warm, dry and intact. No rash noted. Psychiatric: Mood and affect are normal. Speech and behavior are normal.  ____________________________________________  EKG: Interpreted by me.  Sinus rhythm rate of 90 bpm, normal PR interval, normal QRS, long QT, left axis deviation  ____________________________________________  ED COURSE:  As part of my medical decision making, I reviewed the following  data within the [place] History obtained from family if available, nursing notes, old chart and ekg, as well as notes from prior ED visits. Patient presented for chest pain, we will assess with labs and imaging as indicated at this time.   Procedures ____________________________________________   LABS (pertinent positives/negatives)  Labs Reviewed  CBC - Abnormal; Notable for the following components:      Result Value   MCV 100.9 (*)    MCH 34.5 (*)    All other components within normal limits  COMPREHENSIVE METABOLIC PANEL - Abnormal; Notable for the following components:   Potassium 3.2 (*)    Glucose, Bld 102 (*)    BUN <5 (*)    Creatinine, Ser 0.38 (*)    AST 92 (*)    Alkaline Phosphatase 147 (*)    All other components within normal limits  TROPONIN I    RADIOLOGY Images were viewed by me  Chest x-Val IMPRESSION: No acute abnormality.  Moderate to large-sized hiatal hernia.  ____________________________________________  DIFFERENTIAL DIAGNOSIS   Musculoskeletal pain, unstable angina, alcohol withdrawal, gastritis, esophagitis, anxiety  FINAL ASSESSMENT AND  PLAN  Chest pain   Plan: The patient had presented for chest and back pain. Patient's labs do not reveal any acute process, likely indicate chronic alcohol abuse. Patient's imaging did reveal a hiatal hernia.  His alcohol abuse in conjunction with his hiatal hernia likely indicates a GI source for this pain.  There may be a musculoskeletal cause as well.  Overall his testing is unremarkable and he is cleared for outpatient follow-up with antacids.   Laurence Aly, MD   Note: This note was generated in part or whole with voice recognition software. Voice recognition is usually quite accurate but there are transcription errors that can and very often do occur. I apologize for any typographical errors that were not detected and corrected.     Earleen Newport, MD 05/27/17  [phone]

## 2017-06-26 ENCOUNTER — Other Ambulatory Visit: Payer: Self-pay | Admitting: Internal Medicine

## 2017-06-26 ENCOUNTER — Ambulatory Visit
Admission: RE | Admit: 2017-06-26 | Discharge: 2017-06-26 | Disposition: A | Discharge status: Home / Self Care | Payer: Medicare Other | Source: Ambulatory Visit | Attending: Internal Medicine | Admitting: Internal Medicine

## 2017-06-26 DIAGNOSIS — G20 Parkinson's disease: Secondary | ICD-10-CM | POA: Diagnosis present

## 2017-06-26 DIAGNOSIS — R600 Localized edema: Secondary | ICD-10-CM | POA: Diagnosis not present

## 2017-06-27 ENCOUNTER — Other Ambulatory Visit: Payer: Self-pay | Admitting: Internal Medicine

## 2017-06-27 DIAGNOSIS — R945 Abnormal results of liver function studies: Secondary | ICD-10-CM

## 2017-07-04 ENCOUNTER — Ambulatory Visit: Payer: Medicare Other

## 2017-07-11 ENCOUNTER — Ambulatory Visit
Admission: RE | Admit: 2017-07-11 | Discharge: 2017-07-11 | Disposition: A | Discharge status: Home / Self Care | Payer: Medicare Other | Source: Ambulatory Visit | Attending: Internal Medicine | Admitting: Internal Medicine

## 2017-07-11 DIAGNOSIS — R945 Abnormal results of liver function studies: Secondary | ICD-10-CM | POA: Diagnosis not present

## 2017-10-04 ENCOUNTER — Emergency Department: Payer: Medicare Other

## 2017-10-04 ENCOUNTER — Encounter: Payer: Self-pay | Admitting: Emergency Medicine

## 2017-10-04 ENCOUNTER — Other Ambulatory Visit: Payer: Self-pay

## 2017-10-04 ENCOUNTER — Emergency Department
Admission: EM | Admit: 2017-10-04 | Discharge: 2017-10-04 | Disposition: A | Discharge status: Home / Self Care | Payer: Medicare Other | Attending: Emergency Medicine | Admitting: Emergency Medicine

## 2017-10-04 DIAGNOSIS — S0990XA Unspecified injury of head, initial encounter: Secondary | ICD-10-CM | POA: Diagnosis present

## 2017-10-04 DIAGNOSIS — I10 Essential (primary) hypertension: Secondary | ICD-10-CM | POA: Diagnosis not present

## 2017-10-04 DIAGNOSIS — R200 Anesthesia of skin: Secondary | ICD-10-CM | POA: Insufficient documentation

## 2017-10-04 DIAGNOSIS — E119 Type 2 diabetes mellitus without complications: Secondary | ICD-10-CM | POA: Insufficient documentation

## 2017-10-04 DIAGNOSIS — W19XXXA Unspecified fall, initial encounter: Secondary | ICD-10-CM

## 2017-10-04 DIAGNOSIS — Z79899 Other long term (current) drug therapy: Secondary | ICD-10-CM | POA: Diagnosis not present

## 2017-10-04 DIAGNOSIS — Y92013 Bedroom of single-family (private) house as the place of occurrence of the external cause: Secondary | ICD-10-CM | POA: Insufficient documentation

## 2017-10-04 DIAGNOSIS — W06XXXA Fall from bed, initial encounter: Secondary | ICD-10-CM | POA: Diagnosis not present

## 2017-10-04 DIAGNOSIS — Z23 Encounter for immunization: Secondary | ICD-10-CM | POA: Diagnosis not present

## 2017-10-04 DIAGNOSIS — G20 Parkinson's disease: Secondary | ICD-10-CM | POA: Insufficient documentation

## 2017-10-04 DIAGNOSIS — S0081XA Abrasion of other part of head, initial encounter: Secondary | ICD-10-CM | POA: Insufficient documentation

## 2017-10-04 DIAGNOSIS — Z7982 Long term (current) use of aspirin: Secondary | ICD-10-CM | POA: Diagnosis not present

## 2017-10-04 DIAGNOSIS — Y999 Unspecified external cause status: Secondary | ICD-10-CM | POA: Insufficient documentation

## 2017-10-04 DIAGNOSIS — Y9389 Activity, other specified: Secondary | ICD-10-CM | POA: Insufficient documentation

## 2017-10-04 LAB — COMPREHENSIVE METABOLIC PANEL
ALT: 32 U/L (ref 0–44)
ANION GAP: 12 (ref 5–15)
AST: 164 U/L — ABNORMAL HIGH (ref 15–41)
Albumin: 3.7 g/dL (ref 3.5–5.0)
Alkaline Phosphatase: 163 U/L — ABNORMAL HIGH (ref 38–126)
BUN: 6 mg/dL — ABNORMAL LOW (ref 8–23)
CHLORIDE: 103 mmol/L (ref 98–111)
CO2: 27 mmol/L (ref 22–32)
CREATININE: 0.45 mg/dL — AB (ref 0.61–1.24)
Calcium: 8.4 mg/dL — ABNORMAL LOW (ref 8.9–10.3)
GFR calc non Af Amer: >60 mL/min (ref >60)
Glucose, Bld: 95 mg/dL (ref 70–99)
POTASSIUM: 3.1 mmol/L — AB (ref 3.5–5.1)
SODIUM: 142 mmol/L (ref 135–145)
Total Bilirubin: 1.5 mg/dL — ABNORMAL HIGH (ref 0.3–1.2)
Total Protein: 6.9 g/dL (ref 6.5–8.1)

## 2017-10-04 LAB — CBC
HCT: 46.2 % (ref 40.0–52.0)
Hemoglobin: 16.2 g/dL (ref 13.0–18.0)
MCH: 37.4 pg — AB (ref 26.0–34.0)
MCHC: 35 g/dL (ref 32.0–36.0)
MCV: 106.8 fL — ABNORMAL HIGH (ref 80.0–100.0)
PLATELETS: 195 10*3/uL (ref 150–440)
RBC: 4.32 MIL/uL — AB (ref 4.40–5.90)
RDW: 15.3 % — AB (ref 11.5–14.5)
WBC: 4.5 10*3/uL (ref 3.8–10.6)

## 2017-10-04 LAB — TROPONIN I: Troponin I: <0.03 ng/mL (ref <0.03)

## 2017-10-04 MED ORDER — TETANUS-DIPHTH-ACELL PERTUSSIS 5-2.5-18.5 LF-MCG/0.5 IM SUSP
0.5000 mL | Freq: Once | INTRAMUSCULAR | Status: AC
  Administered 2017-10-04: 0.5 mL via INTRAMUSCULAR
  Filled 2017-10-04: qty 0.5

## 2017-10-04 NOTE — ED Triage Notes (Signed)
Pt tripped and fell hitting piece of furniture when fell.  Abrasions to face and right arm. Swelling noted to feet. Hx parkinson. Pt c/o numbness/weakness to right arm X 2-3 days.

## 2017-10-04 NOTE — ED Notes (Addendum)
Patient transported to CT/XRAY 

## 2017-10-04 NOTE — ED Provider Notes (Signed)
[facility] Emergency Department Provider Note  ____________________________________________  Time seen: Approximately 11:27 AM  I have reviewed the triage vital signs and the nursing notes.   HISTORY  Chief Complaint Fall and Numbness    HPI HALIM SURRETTE is a 62 y.o. male with a history of anxiety depression hypertension Parkinson's disease who reports a fall this morning against his bedroom furniture.  No loss of consciousness.  Denies neck pain or back pain.  No headache or vision change.  No paresthesias or weakness.  There is a trip and fall, he got tangled up in his blanket next to his bed which combined with his Parkinson's caused him to lose his balance.  Denies any prodromal symptoms.  Otherwise in his usual state of health.      Past Medical History:  Diagnosis Date  . Abnormal liver function   . Anemia   . Anxiety   . Colonic mass   . Depression   . Dizziness   . Dry cough   . History of colon polyps   . Hypercholesteremia   . Hyperlipemia   . Hypertension   . Palpitations   . Parkinson's disease ([place])   . Parkinson's disease ([place])   . PVD (peripheral vascular disease) ([place])   . Tremors of nervous system      Patient Active Problem List   Diagnosis Date Noted  . Esophageal obstruction 10/10/2016  . Esophageal ulceration 10/10/2016  . GI bleed 09/20/2016  . Low vitamin B12 level 03/18/2016  . S/P kyphoplasty 03/18/2016  . Basal cell carcinoma 02/08/2016  . Atrial fibrillation ([place]) 01/27/2016  . Colitis 01/27/2016  . HLD (hyperlipidemia) 01/27/2016  . Anxiety 01/27/2016  . Hypokalemia 01/27/2016  . Major depressive disorder, recurrent episode, moderate ([place]) 04/14/2015  . Anxiety, generalized 12/06/2013  . Parkinson disease ([place]) 08/07/2013  . Dysphagia 05/12/2013  . Esophageal mass 05/12/2013  . GERD (gastroesophageal reflux disease) 05/12/2013  . Vomiting 05/12/2013  . Open bite of lower leg 05/10/2013  . Dog bite of  lower leg 05/10/2013  . Abnormal finding on liver function 03/25/2013  . Benign neoplasm 03/25/2013  . Dizziness 03/25/2013  . Type 2 diabetes mellitus ([place]) 03/25/2013  . BP (high blood pressure) 03/25/2013  . Awareness of heartbeats 03/25/2013  . Pure hypercholesterolemia 03/25/2013  . Infective tonsillitis 03/25/2013  . Adenomatous polyp 03/25/2013     Past Surgical History:  Procedure Laterality Date  . BACK SURGERY     Kyphoplasty   March 2018  . COLONOSCOPY    . ESOPHAGOGASTRODUODENOSCOPY (EGD) WITH PROPOFOL N/A 09/21/2016   Procedure: ESOPHAGOGASTRODUODENOSCOPY (EGD) WITH PROPOFOL;  Surgeon: Jonathon Bellows, MD;  Location: [facility] ENDOSCOPY;  Service: Gastroenterology;  Laterality: N/A;  . ESOPHAGOGASTRODUODENOSCOPY (EGD) WITH PROPOFOL N/A 11/07/2016   Procedure: ESOPHAGOGASTRODUODENOSCOPY (EGD) WITH PROPOFOL;  Surgeon: Jonathon Bellows, MD;  Location: [facility] ENDOSCOPY;  Service: Gastroenterology;  Laterality: N/A;  . HERNIA REPAIR     left inguinal hernia repair as an infant  . KYPHOPLASTY N/A 02/11/2016   Procedure: KYPHOPLASTY  L2,T9;  Surgeon: Hessie Knows, MD;  Location: [facility] ORS;  Service: Orthopedics;  Laterality: N/A;  . KYPHOPLASTY N/A 03/15/2016   Procedure: KYPHOPLASTY L3;  Surgeon: Hessie Knows, MD;  Location: [facility] ORS;  Service: Orthopedics;  Laterality: N/A;  . skin cancer removed    . TONSILLECTOMY       Prior to Admission medications   Medication Sig Start Date End Date Taking? Authorizing Provider  aspirin EC 81 MG tablet Take 81  mg by mouth daily.    [provider]  baclofen (LIORESAL) 10 MG tablet Take 10 mg by mouth daily.  10/03/16   [provider]  carbidopa-levodopa (SINEMET CR) 50-200 MG tablet Take 1 tablet by mouth at bedtime.    [provider]  carbidopa-levodopa (SINEMET IR) 25-100 MG tablet TK 1 T PO four times a day 08/16/16   [provider]  cyclobenzaprine (FLEXERIL) 10 MG tablet Take by mouth. 05/18/16   [provider]  gabapentin (NEURONTIN) 300 MG capsule TAKE 1 CAPSULE(300 MG) BY MOUTH THREE TIMES DAILY 08/02/16   [provider]  hydrochlorothiazide (HYDRODIURIL) 25 MG tablet Take by mouth.    [provider]  lisinopril (PRINIVIL,ZESTRIL) 20 MG tablet Take 20 mg by mouth daily.     [provider]  omeprazole (PRILOSEC) 20 MG capsule Take 20 mg by mouth daily.  10/01/16   [provider]  pantoprazole (PROTONIX) 40 MG tablet Take 1 tablet (40 mg total) by mouth 2 (two) times daily. 09/22/16   Max Sane, MD  potassium chloride SA (K-DUR,KLOR-CON) 20 MEQ tablet Take 20 mEq by mouth 2 (two) times daily. 06/29/16   [provider]  predniSONE (DELTASONE) 20 MG tablet Take 2 tablets (40 mg total) by mouth daily with breakfast. 01/26/17   Delman Kitten, MD  sertraline (ZOLOFT) 50 MG tablet Take 1 tablet (50 mg total) by mouth daily. 08/08/16   Rainey Pines, MD  simvastatin (ZOCOR) 20 MG tablet Take 20 mg by mouth at bedtime. Reported on 04/14/2015    [provider]  sucralfate (CARAFATE) 1 g tablet Take 1 tablet (1 g total) by mouth 4 (four) times daily. 09/22/16   Max Sane, MD  traMADol (ULTRAM) 50 MG tablet Take 1 tablet (50 mg total) by mouth every 6 (six) hours as needed. 01/06/17 01/06/18  Lavonia Drafts, MD     Allergies Amantadine   Family History  Problem Relation Age of Onset  . Heart Problems Mother   . Dementia Mother     Social History Social History   Tobacco Use  . Smoking status: Never Smoker  . Smokeless tobacco: Never Used  Substance Use Topics  . Alcohol use: Yes    Alcohol/week: 2.0 standard drinks    Types: 2 Cans of beer per week    Comment: Daily 2 beers and shot of whiskey  . Drug use: No    Review of Systems  Constitutional:   No fever or chills.  ENT:   No sore throat. No rhinorrhea. Cardiovascular:   No chest pain or syncope. Respiratory:   No dyspnea or cough. Gastrointestinal:   Negative for abdominal  pain, vomiting and diarrhea.  Musculoskeletal:   Negative for focal pain or swelling All other systems reviewed and are negative except as documented above in ROS and HPI.  ____________________________________________   PHYSICAL EXAM:  VITAL SIGNS: ED Triage Vitals  Enc Vitals Group     BP 10/04/17 0630 139/90     Pulse Rate 10/04/17 0630 98     Resp 10/04/17 0630 16     Temp 10/04/17 0633 98 F (36.7 C)     Temp Source 10/04/17 0633 Oral     SpO2 10/04/17 0630 96 %     Weight 10/04/17 0629 130 lb (59 kg)     Height 10/04/17 0629 5\' 2"  (1.575 m)     Head Circumference --      Peak Flow --  Pain Score 10/04/17 0626 2     Pain Loc --      Pain Edu? --      Excl. in [address]? --     Vital signs reviewed, nursing assessments reviewed.   Constitutional:   Alert and oriented. Non-toxic appearance. Eyes:   Conjunctivae are normal. EOMI. PERRL. ENT      Head:   Normocephalic with several scattered small abrasions.  No apparent eye injury.  Hemostatic.  No laceration, no bony tenderness      Nose:   No congestion/rhinnorhea.  No epistaxis      Mouth/Throat:   MMM, no pharyngeal erythema. No peritonsillar mass.  Alcohol on breath      Neck:   No meningismus. Full ROM.  Mild midline tenderness at the lower cervical spine Hematological/Lymphatic/Immunilogical:   No cervical lymphadenopathy. Cardiovascular:   RRR. Symmetric bilateral radial and DP pulses.  No murmurs. Cap refill less than 2 seconds. Respiratory:   Normal respiratory effort without tachypnea/retractions. Breath sounds are clear and equal bilaterally. No wheezes/rales/rhonchi. Gastrointestinal:   Soft and nontender. Non distended. There is no CVA tenderness.  No rebound, rigidity, or guarding.  Musculoskeletal:   Normal range of motion in all extremities. No joint effusions.  No lower extremity tenderness.  No edema.  No midline tenderness of the thoracic or lumbar spine Neurologic:   Normal speech and language.  Motor  grossly intact. No acute focal neurologic deficits are appreciated.  Skin:    Skin is warm, dry and intact. No rash noted.  No petechiae, purpura, or bullae.  ____________________________________________    LABS (pertinent positives/negatives) (all labs ordered are listed, but only abnormal results are displayed) Labs Reviewed  CBC - Abnormal; Notable for the following components:      Result Value   RBC 4.32 (*)    MCV 106.8 (*)    MCH 37.4 (*)    RDW 15.3 (*)    All other components within normal limits  COMPREHENSIVE METABOLIC PANEL - Abnormal; Notable for the following components:   Potassium 3.1 (*)    BUN 6 (*)    Creatinine, Ser 0.45 (*)    Calcium 8.4 (*)    AST 164 (*)    Alkaline Phosphatase 163 (*)    Total Bilirubin 1.5 (*)    All other components within normal limits  TROPONIN I  URINALYSIS, COMPLETE (UACMP) WITH MICROSCOPIC  CBG MONITORING, ED   ____________________________________________   EKG  Interpreted by me Interpretation limited by patient's baseline tremor, likely sinus rhythm rate of 94, normal axis and intervals.  Normal QRS ST segments and T waves.  ____________________________________________    RADIOLOGY  Dg Chest 2 View  Result Date: 10/04/2017 CLINICAL DATA:  62 year old male status post trip and fall striking furniture. Right upper extremity numbness and weakness for 2-3 days. EXAM: CHEST - 2 VIEW COMPARISON:  Portable chest 05/31/2017 and earlier. FINDINGS: Seated AP and lateral views of the chest. Multilevel chronic lower thoracic and lumbar compression fractures, several previously augmented. Compression fractures of T6 and T7 are new since thoracic radiographs in January this year. Chronic T11 through L3 compression. Stable lung volumes and mediastinal contours with no pneumothorax, pulmonary edema, pleural effusion or confluent pulmonary opacity. Visualized tracheal air column is within normal limits. Negative visible bowel gas pattern.  Chronic right rib fractures. No other acute osseous abnormality identified. IMPRESSION: 1. Compression fractures of T6 and T7 are new since January and might be acute in this clinical setting. Thoracic  MRI or Nuclear Medicine Whole-body Bone Scan would best determine acuity. Chronic T9 and T11 through L3 compression fractures. Chronic right rib fractures. 2. No acute cardiopulmonary abnormality. Electronically Signed   By: Genevie Ann M.D.   On: 10/04/2017 07:38   Dg Cervical Spine Complete  Result Date: 10/04/2017 CLINICAL DATA:  Fall, neck pain EXAM: CERVICAL SPINE - COMPLETE 4+ VIEW COMPARISON:  CT 01/06/2017 FINDINGS: Prevertebral soft tissues are normal. Alignment is normal. Accentuation of cervical lordosis. Mild degenerative facet disease diffusely. No fracture. IMPRESSION: Degenerative facet disease.  No acute bony abnormality. Electronically Signed   By: Rolm Baptise M.D.   On: 10/04/2017 08:20   Ct Head Wo Contrast  Result Date: 10/04/2017 CLINICAL DATA:  Pain following fall EXAM: CT HEAD WITHOUT CONTRAST TECHNIQUE: Contiguous axial images were obtained from the base of the skull through the vertex without intravenous contrast. COMPARISON:  January 06, 2017. FINDINGS: Brain: There is moderate diffuse atrophy. There is no intracranial mass, hemorrhage, extra-axial fluid collection, or midline shift. There is mild small vessel disease in the centra semiovale bilaterally. Brain parenchyma elsewhere appears unremarkable. No acute infarct is evident. Vascular: No hyperdense vessel. There is mild calcification in each cavernous carotid artery region. Skull: The bony calvarium appears intact. Sinuses/Orbits: There is mucosal thickening in several ethmoid air cells bilaterally. Other visualized paranasal sinuses are clear. There is rightward deviation of the nasal septum. Orbits appear symmetric bilaterally. Other: Mastoid air cells are clear. IMPRESSION: Stable atrophy with slight periventricular small vessel  disease. No acute infarct evident. No mass or hemorrhage. There are foci of arterial vascular calcification. There is mucosal thickening in several ethmoid air cells. There is deviation of the nasal septum. Electronically Signed   By: Lowella Grip III M.D.   On: 10/04/2017 07:08    ____________________________________________   PROCEDURES Procedures  ____________________________________________  DIFFERENTIAL DIAGNOSIS   Subdural hematoma, cervical spine fracture, chest contusion, facial abrasions  CLINICAL IMPRESSION / ASSESSMENT AND PLAN / ED COURSE  Pertinent labs & imaging results that were available during my care of the patient were reviewed by me and considered in my medical decision making (see chart for details).   Patient nontoxic, vital signs are normal, due to the fall and mild C-spine tenderness obtained a cervical spine x-Gao which is negative.  Chest x-Kerlin suggest a T6 and T7 compression fracture but the patient has no pain or tenderness at this area, not acute.  CT head is negative.  Labs are unremarkable.  Stable for discharge home.  Tetanus was updated today.     ____________________________________________   FINAL CLINICAL IMPRESSION(S) / ED DIAGNOSES    Final diagnoses:  Fall, initial encounter  Facial abrasion, initial encounter     ED Discharge Orders    None      Portions of this note were generated with dragon dictation software. Dictation errors may occur despite best attempts at proofreading.    Carrie Mew, MD 10/04/17 1135

## 2017-11-02 ENCOUNTER — Other Ambulatory Visit: Payer: Self-pay | Admitting: Internal Medicine

## 2017-11-02 ENCOUNTER — Ambulatory Visit
Admission: RE | Admit: 2017-11-02 | Discharge: 2017-11-02 | Disposition: A | Discharge status: Home / Self Care | Payer: Medicare Other | Source: Ambulatory Visit | Attending: Internal Medicine | Admitting: Internal Medicine

## 2017-11-02 DIAGNOSIS — R200 Anesthesia of skin: Secondary | ICD-10-CM | POA: Insufficient documentation

## 2017-11-02 DIAGNOSIS — M7989 Other specified soft tissue disorders: Secondary | ICD-10-CM | POA: Diagnosis present

## 2017-12-09 ENCOUNTER — Inpatient Hospital Stay
Admission: EM | Admit: 2017-12-09 | Discharge: 2017-12-12 | DRG: 378 | Disposition: A | Discharge status: Home / Self Care | Payer: Medicare Other | Attending: Internal Medicine | Admitting: Internal Medicine

## 2017-12-09 ENCOUNTER — Encounter: Payer: Self-pay | Admitting: Emergency Medicine

## 2017-12-09 ENCOUNTER — Other Ambulatory Visit: Payer: Self-pay

## 2017-12-09 DIAGNOSIS — K621 Rectal polyp: Secondary | ICD-10-CM | POA: Diagnosis not present

## 2017-12-09 DIAGNOSIS — I10 Essential (primary) hypertension: Secondary | ICD-10-CM | POA: Diagnosis present

## 2017-12-09 DIAGNOSIS — G20 Parkinson's disease: Secondary | ICD-10-CM | POA: Diagnosis present

## 2017-12-09 DIAGNOSIS — E785 Hyperlipidemia, unspecified: Secondary | ICD-10-CM | POA: Diagnosis present

## 2017-12-09 DIAGNOSIS — G894 Chronic pain syndrome: Secondary | ICD-10-CM | POA: Diagnosis present

## 2017-12-09 DIAGNOSIS — F411 Generalized anxiety disorder: Secondary | ICD-10-CM | POA: Diagnosis present

## 2017-12-09 DIAGNOSIS — I480 Paroxysmal atrial fibrillation: Secondary | ICD-10-CM | POA: Diagnosis not present

## 2017-12-09 DIAGNOSIS — K210 Gastro-esophageal reflux disease with esophagitis: Secondary | ICD-10-CM | POA: Diagnosis present

## 2017-12-09 DIAGNOSIS — K921 Melena: Secondary | ICD-10-CM | POA: Diagnosis present

## 2017-12-09 DIAGNOSIS — Z7982 Long term (current) use of aspirin: Secondary | ICD-10-CM

## 2017-12-09 DIAGNOSIS — Z79899 Other long term (current) drug therapy: Secondary | ICD-10-CM

## 2017-12-09 DIAGNOSIS — E7800 Pure hypercholesterolemia, unspecified: Secondary | ICD-10-CM | POA: Diagnosis present

## 2017-12-09 DIAGNOSIS — F329 Major depressive disorder, single episode, unspecified: Secondary | ICD-10-CM | POA: Diagnosis present

## 2017-12-09 DIAGNOSIS — Z85828 Personal history of other malignant neoplasm of skin: Secondary | ICD-10-CM

## 2017-12-09 DIAGNOSIS — Z86010 Personal history of colonic polyps: Secondary | ICD-10-CM | POA: Diagnosis not present

## 2017-12-09 DIAGNOSIS — K5730 Diverticulosis of large intestine without perforation or abscess without bleeding: Secondary | ICD-10-CM | POA: Diagnosis present

## 2017-12-09 DIAGNOSIS — K2901 Acute gastritis with bleeding: Principal | ICD-10-CM | POA: Diagnosis present

## 2017-12-09 DIAGNOSIS — I4891 Unspecified atrial fibrillation: Secondary | ICD-10-CM | POA: Diagnosis present

## 2017-12-09 DIAGNOSIS — K2210 Ulcer of esophagus without bleeding: Secondary | ICD-10-CM | POA: Diagnosis present

## 2017-12-09 DIAGNOSIS — I739 Peripheral vascular disease, unspecified: Secondary | ICD-10-CM | POA: Diagnosis present

## 2017-12-09 DIAGNOSIS — Z8711 Personal history of peptic ulcer disease: Secondary | ICD-10-CM

## 2017-12-09 DIAGNOSIS — Z8719 Personal history of other diseases of the digestive system: Secondary | ICD-10-CM | POA: Diagnosis not present

## 2017-12-09 DIAGNOSIS — K449 Diaphragmatic hernia without obstruction or gangrene: Secondary | ICD-10-CM | POA: Diagnosis present

## 2017-12-09 DIAGNOSIS — I4892 Unspecified atrial flutter: Secondary | ICD-10-CM | POA: Diagnosis present

## 2017-12-09 DIAGNOSIS — Z818 Family history of other mental and behavioral disorders: Secondary | ICD-10-CM

## 2017-12-09 DIAGNOSIS — E538 Deficiency of other specified B group vitamins: Secondary | ICD-10-CM | POA: Diagnosis present

## 2017-12-09 DIAGNOSIS — K3189 Other diseases of stomach and duodenum: Secondary | ICD-10-CM | POA: Diagnosis present

## 2017-12-09 DIAGNOSIS — Z8249 Family history of ischemic heart disease and other diseases of the circulatory system: Secondary | ICD-10-CM

## 2017-12-09 DIAGNOSIS — F1011 Alcohol abuse, in remission: Secondary | ICD-10-CM | POA: Diagnosis present

## 2017-12-09 DIAGNOSIS — K922 Gastrointestinal hemorrhage, unspecified: Secondary | ICD-10-CM | POA: Diagnosis present

## 2017-12-09 DIAGNOSIS — Z79891 Long term (current) use of opiate analgesic: Secondary | ICD-10-CM

## 2017-12-09 DIAGNOSIS — Z9089 Acquired absence of other organs: Secondary | ICD-10-CM | POA: Diagnosis not present

## 2017-12-09 DIAGNOSIS — Z888 Allergy status to other drugs, medicaments and biological substances status: Secondary | ICD-10-CM

## 2017-12-09 HISTORY — DX: Unspecified atrial fibrillation: I48.91

## 2017-12-09 LAB — COMPREHENSIVE METABOLIC PANEL
ALBUMIN: 3.6 g/dL (ref 3.5–5.0)
ALT: 10 U/L (ref 0–44)
AST: 53 U/L — AB (ref 15–41)
Alkaline Phosphatase: 159 U/L — ABNORMAL HIGH (ref 38–126)
Anion gap: 12 (ref 5–15)
BUN: 24 mg/dL — AB (ref 8–23)
CHLORIDE: 106 mmol/L (ref 98–111)
CO2: 23 mmol/L (ref 22–32)
CREATININE: 0.4 mg/dL — AB (ref 0.61–1.24)
Calcium: 9 mg/dL (ref 8.9–10.3)
GFR calc Af Amer: >60 mL/min (ref >60)
GLUCOSE: 108 mg/dL — AB (ref 70–99)
POTASSIUM: 3.7 mmol/L (ref 3.5–5.1)
SODIUM: 141 mmol/L (ref 135–145)
Total Bilirubin: 1.1 mg/dL (ref 0.3–1.2)
Total Protein: 6.7 g/dL (ref 6.5–8.1)

## 2017-12-09 LAB — CBC
HEMATOCRIT: 39.8 % (ref 39.0–52.0)
HEMOGLOBIN: 13.5 g/dL (ref 13.0–17.0)
MCH: 37.1 pg — ABNORMAL HIGH (ref 26.0–34.0)
MCHC: 33.9 g/dL (ref 30.0–36.0)
MCV: 109.3 fL — AB (ref 80.0–100.0)
Platelets: 212 10*3/uL (ref 150–400)
RBC: 3.64 MIL/uL — ABNORMAL LOW (ref 4.22–5.81)
RDW: 13.3 % (ref 11.5–15.5)
WBC: 8 10*3/uL (ref 4.0–10.5)
nRBC: 0 % (ref 0.0–0.2)

## 2017-12-09 LAB — HEMOGLOBIN AND HEMATOCRIT, BLOOD
HCT: 37.5 % — ABNORMAL LOW (ref 39.0–52.0)
Hemoglobin: 12.8 g/dL — ABNORMAL LOW (ref 13.0–17.0)

## 2017-12-09 LAB — TYPE AND SCREEN
ABO/RH(D): B POS
ANTIBODY SCREEN: NEGATIVE

## 2017-12-09 LAB — TROPONIN I: Troponin I: <0.03 ng/mL (ref <0.03)

## 2017-12-09 MED ORDER — HYDROCHLOROTHIAZIDE 25 MG PO TABS: 25.0000 mg | ORAL_TABLET | Freq: Every day | ORAL | Status: DC

## 2017-12-09 MED ORDER — ONDANSETRON HCL 4 MG PO TABS: 4.0000 mg | ORAL_TABLET | Freq: Four times a day (QID) | ORAL | Status: DC | PRN

## 2017-12-09 MED ORDER — PANTOPRAZOLE SODIUM 40 MG IV SOLR: 40.0000 mg | Freq: Two times a day (BID) | INTRAVENOUS | Status: DC

## 2017-12-09 MED ORDER — DIGOXIN 0.25 MG/ML IJ SOLN
0.5000 mg | Freq: Once | INTRAMUSCULAR | Status: DC
  Filled 2017-12-09: qty 2

## 2017-12-09 MED ORDER — SODIUM CHLORIDE 0.9% FLUSH: 3.0000 mL | INTRAVENOUS | Status: DC | PRN

## 2017-12-09 MED ORDER — DIAZEPAM 5 MG PO TABS
5.0000 mg | ORAL_TABLET | Freq: Four times a day (QID) | ORAL | Status: DC | PRN
  Administered 2017-12-10: 5 mg via ORAL
  Filled 2017-12-09: qty 1

## 2017-12-09 MED ORDER — DIGOXIN 0.25 MG/ML IJ SOLN
0.2500 mg | Freq: Once | INTRAMUSCULAR | Status: AC
  Administered 2017-12-09: 0.25 mg via INTRAVENOUS
  Filled 2017-12-09: qty 1

## 2017-12-09 MED ORDER — SODIUM CHLORIDE 0.9 % IV SOLN
8.0000 mg/h | INTRAVENOUS | Status: DC
  Administered 2017-12-09 – 2017-12-11 (×5): 8 mg/h via INTRAVENOUS
  Filled 2017-12-09 (×4): qty 80

## 2017-12-09 MED ORDER — LISINOPRIL 10 MG PO TABS: 20.0000 mg | ORAL_TABLET | Freq: Every day | ORAL | Status: DC

## 2017-12-09 MED ORDER — CARBIDOPA-LEVODOPA ER 50-200 MG PO TBCR
1.0000 | EXTENDED_RELEASE_TABLET | Freq: Every day | ORAL | Status: DC
  Administered 2017-12-09 – 2017-12-11 (×3): 1 via ORAL
  Filled 2017-12-09 (×4): qty 1

## 2017-12-09 MED ORDER — SIMVASTATIN 20 MG PO TABS
20.0000 mg | ORAL_TABLET | Freq: Every day | ORAL | Status: DC
  Administered 2017-12-09 – 2017-12-11 (×3): 20 mg via ORAL
  Filled 2017-12-09 (×4): qty 1

## 2017-12-09 MED ORDER — METOPROLOL TARTRATE 5 MG/5ML IV SOLN
2.5000 mg | Freq: Four times a day (QID) | INTRAVENOUS | Status: DC | PRN
  Administered 2017-12-09: 2.5 mg via INTRAVENOUS
  Filled 2017-12-09: qty 5

## 2017-12-09 MED ORDER — SODIUM CHLORIDE 0.9 % IV SOLN: 8.0000 mg/h | INTRAVENOUS | Status: DC

## 2017-12-09 MED ORDER — BACLOFEN 10 MG PO TABS
10.0000 mg | ORAL_TABLET | Freq: Every day | ORAL | Status: DC
  Administered 2017-12-09 – 2017-12-11 (×3): 10 mg via ORAL
  Filled 2017-12-09 (×4): qty 1

## 2017-12-09 MED ORDER — SODIUM CHLORIDE 0.9% FLUSH
3.0000 mL | Freq: Two times a day (BID) | INTRAVENOUS | Status: DC
  Administered 2017-12-09 – 2017-12-12 (×4): 3 mL via INTRAVENOUS

## 2017-12-09 MED ORDER — SODIUM CHLORIDE 0.9 % IV SOLN
250.0000 mL | INTRAVENOUS | Status: DC | PRN
  Administered 2017-12-11 – 2017-12-12 (×2): 1000 mL via INTRAVENOUS

## 2017-12-09 MED ORDER — POTASSIUM CHLORIDE 20 MEQ/15ML (10%) PO SOLN
20.0000 meq | Freq: Every day | ORAL | Status: DC
  Administered 2017-12-10 – 2017-12-12 (×3): 20 meq via ORAL
  Filled 2017-12-09 (×4): qty 15

## 2017-12-09 MED ORDER — SUCRALFATE 1 G PO TABS
1.0000 g | ORAL_TABLET | Freq: Four times a day (QID) | ORAL | Status: DC
  Administered 2017-12-09 – 2017-12-12 (×10): 1 g via ORAL
  Filled 2017-12-09 (×10): qty 1

## 2017-12-09 MED ORDER — SODIUM CHLORIDE 0.9 % IV SOLN
80.0000 mg | Freq: Once | INTRAVENOUS | Status: AC
  Administered 2017-12-09: 80 mg via INTRAVENOUS
  Filled 2017-12-09: qty 80

## 2017-12-09 MED ORDER — METOPROLOL TARTRATE 5 MG/5ML IV SOLN
5.0000 mg | Freq: Once | INTRAVENOUS | Status: AC
  Administered 2017-12-09: 5 mg via INTRAVENOUS
  Filled 2017-12-09: qty 5

## 2017-12-09 MED ORDER — NITROGLYCERIN 0.4 MG SL SUBL: 0.4000 mg | SUBLINGUAL_TABLET | SUBLINGUAL | Status: DC | PRN

## 2017-12-09 MED ORDER — CARBIDOPA-LEVODOPA 25-100 MG PO TABS
1.0000 | ORAL_TABLET | Freq: Four times a day (QID) | ORAL | Status: DC
  Administered 2017-12-09 – 2017-12-12 (×9): 1 via ORAL
  Filled 2017-12-09 (×14): qty 1

## 2017-12-09 MED ORDER — SERTRALINE HCL 50 MG PO TABS
50.0000 mg | ORAL_TABLET | Freq: Every day | ORAL | Status: DC
  Administered 2017-12-09 – 2017-12-12 (×4): 50 mg via ORAL
  Filled 2017-12-09 (×4): qty 1

## 2017-12-09 MED ORDER — CYCLOBENZAPRINE HCL 10 MG PO TABS: 5.0000 mg | ORAL_TABLET | Freq: Three times a day (TID) | ORAL | Status: DC | PRN

## 2017-12-09 MED ORDER — METOPROLOL TARTRATE 50 MG PO TABS
50.0000 mg | ORAL_TABLET | Freq: Two times a day (BID) | ORAL | Status: DC
  Administered 2017-12-09 – 2017-12-12 (×5): 50 mg via ORAL
  Filled 2017-12-09 (×6): qty 1

## 2017-12-09 MED ORDER — DIAZEPAM 5 MG PO TABS
5.0000 mg | ORAL_TABLET | Freq: Once | ORAL | Status: AC
  Administered 2017-12-09: 5 mg via ORAL
  Filled 2017-12-09: qty 1

## 2017-12-09 MED ORDER — ACETAMINOPHEN 650 MG RE SUPP: 650.0000 mg | Freq: Four times a day (QID) | RECTAL | Status: DC | PRN

## 2017-12-09 MED ORDER — MORPHINE SULFATE (PF) 2 MG/ML IV SOLN: 2.0000 mg | INTRAVENOUS | Status: DC | PRN

## 2017-12-09 MED ORDER — METOPROLOL TARTRATE 25 MG PO TABS: 37.5000 mg | ORAL_TABLET | Freq: Two times a day (BID) | ORAL | Status: DC

## 2017-12-09 MED ORDER — TRAMADOL HCL 50 MG PO TABS: 50.0000 mg | ORAL_TABLET | Freq: Four times a day (QID) | ORAL | Status: DC | PRN

## 2017-12-09 MED ORDER — ONDANSETRON HCL 4 MG/2ML IJ SOLN
4.0000 mg | Freq: Four times a day (QID) | INTRAMUSCULAR | Status: DC | PRN
  Administered 2017-12-09: 4 mg via INTRAVENOUS
  Filled 2017-12-09: qty 2

## 2017-12-09 MED ORDER — ACETAMINOPHEN 325 MG PO TABS: 650.0000 mg | ORAL_TABLET | Freq: Four times a day (QID) | ORAL | Status: DC | PRN

## 2017-12-09 MED ORDER — GABAPENTIN 100 MG PO CAPS
100.0000 mg | ORAL_CAPSULE | Freq: Three times a day (TID) | ORAL | Status: DC
  Administered 2017-12-09 – 2017-12-11 (×3): 100 mg via ORAL
  Filled 2017-12-09 (×7): qty 1

## 2017-12-09 MED ORDER — SODIUM CHLORIDE 0.9 % IV SOLN: 80.0000 mg | Freq: Once | INTRAVENOUS | Status: DC

## 2017-12-09 NOTE — Progress Notes (Signed)
Pt.'s HR jumping between 120-150s with telemetry saying pt is in AFib,  Lopressor  60ml IV one time dose was given to pt at 1804, PRN Lopressor 2.5mg  IV was given at 1847. Pt is anxious and Valium was given at 1809. Pt is asymptomatic and is not complaining of any pain at this time. BP 129/94. After IV Lopressor PRN medication was given HR started jumping between 110s-120s. MD notified, verbal order to give 0.25 mg of digoxin IV once and increase metoprolol 50 mg PO BID. Pt is resting in bed at this time, no complaints, pt.'s VTE is SCDs. MD aware.   [employer]

## 2017-12-09 NOTE — ED Triage Notes (Addendum)
Pt arrived via EMS from home with reports of black watery stools since yesterday, pt reports he has hx of UGI Bleed in the past.  Pt states the stools are as black as his shoes. Pt denies any pain at this time.   Pt has hx of parkinson's. Pt does not take any blood thinners.   Pt had smear of black stool on underwear on arrival.

## 2017-12-09 NOTE — H&P (Signed)
[facility] [place] NAME: Charles Stanton    MR#:  [id number]  DATE OF BIRTH:  February 12, 1955  DATE OF ADMISSION:  12/09/2017  PRIMARY CARE PHYSICIAN: [name], MD   REQUESTING/REFERRING PHYSICIAN:   CHIEF COMPLAINT:   Chief Complaint  Patient presents with  . GI Bleeding    HISTORY OF PRESENT ILLNESS: Charles Stanton  is a 62 y.o. male with a known history per below which also includes history of upper GI bleeding, presented emergency room with black stools since yesterday, in the emergency room patient was found to have black stool on presentation, hemoglobin stable at 13.5, noted tachycardia-heart rate up to the 150s intermittently, EKG noted for atrial flutter, patient denies shortness of breath/chest pain, patient evaluated in the emergency room, daughter at the bedside, patient is now being admitted for acute melena and a flutter with RVR.  PAST MEDICAL HISTORY:   Past Medical History:  Diagnosis Date  . Abnormal liver function   . Anemia   . Anxiety   . Atrial fibrillation ([place])   . Colonic mass   . Depression   . Dizziness   . Dry cough   . History of colon polyps   . Hypercholesteremia   . Hyperlipemia   . Hypertension   . Palpitations   . Parkinson's disease ([place])   . Parkinson's disease ([place])   . PVD (peripheral vascular disease) ([place])   . Tremors of nervous system     PAST SURGICAL HISTORY:  Past Surgical History:  Procedure Laterality Date  . BACK SURGERY     Kyphoplasty   March 2018  . COLONOSCOPY    . ESOPHAGOGASTRODUODENOSCOPY (EGD) WITH PROPOFOL N/A 09/21/2016   Procedure: ESOPHAGOGASTRODUODENOSCOPY (EGD) WITH PROPOFOL;  Surgeon: Jonathon Bellows, MD;  Location: [facility] ENDOSCOPY;  Service: Gastroenterology;  Laterality: N/A;  . ESOPHAGOGASTRODUODENOSCOPY (EGD) WITH PROPOFOL N/A 11/07/2016   Procedure: ESOPHAGOGASTRODUODENOSCOPY (EGD) WITH PROPOFOL;  Surgeon: Jonathon Bellows, MD;  Location: [facility] ENDOSCOPY;  Service:  Gastroenterology;  Laterality: N/A;  . HERNIA REPAIR     left inguinal hernia repair as an infant  . KYPHOPLASTY N/A 02/11/2016   Procedure: KYPHOPLASTY  L2,T9;  Surgeon: Hessie Knows, MD;  Location: [facility] ORS;  Service: Orthopedics;  Laterality: N/A;  . KYPHOPLASTY N/A 03/15/2016   Procedure: KYPHOPLASTY L3;  Surgeon: Hessie Knows, MD;  Location: [facility] ORS;  Service: Orthopedics;  Laterality: N/A;  . skin cancer removed    . TONSILLECTOMY      SOCIAL HISTORY:  Social History   Tobacco Use  . Smoking status: Never Smoker  . Smokeless tobacco: Never Used  Substance Use Topics  . Alcohol use: Yes    Alcohol/week: 2.0 standard drinks    Types: 2 Cans of beer per week    Comment: Daily 2 beers and shot of whiskey    FAMILY HISTORY:  Family History  Problem Relation Age of Onset  . Heart Problems Mother   . Dementia Mother     DRUG ALLERGIES:  Allergies  Allergen Reactions  . Amantadine Other (See Comments)    fatigue sleepy Other reaction(s): Other (See Comments) fatigue sleepy fatigue sleepy fatigue sleepy    REVIEW OF SYSTEMS:   CONSTITUTIONAL: No fever, fatigue or weakness.  EYES: No blurred or double vision.  EARS, NOSE, AND THROAT: No tinnitus or ear pain.  RESPIRATORY: No cough, shortness of breath, wheezing or hemoptysis.  CARDIOVASCULAR: No chest pain, orthopnea, edema.  GASTROINTESTINAL: No nausea, vomiting, + black stools,  no abdominal pain.  GENITOURINARY: No dysuria, hematuria.  ENDOCRINE: No polyuria, nocturia,  HEMATOLOGY: No anemia, easy bruising or bleeding SKIN: No rash or lesion. MUSCULOSKELETAL: No joint pain or arthritis.   NEUROLOGIC: No tingling, numbness, weakness. + Anxiety  pSYCHIATRY: No anxiety or depression.   MEDICATIONS AT HOME:  Prior to Admission medications   Medication Sig Start Date End Date Taking? Authorizing Provider  carbidopa-levodopa (SINEMET CR) 50-200 MG tablet Take 1 tablet by mouth at bedtime.   Yes [provider]  carbidopa-levodopa (SINEMET IR) 25-100 MG tablet TK 1 T PO four times a day 08/16/16  Yes [provider]  lisinopril (PRINIVIL,ZESTRIL) 20 MG tablet Take 20 mg by mouth daily.    Yes [provider]  omeprazole (PRILOSEC) 20 MG capsule Take 20 mg by mouth daily.  10/01/16  Yes [provider]  potassium chloride 20 MEQ/15ML (10%) SOLN Take 20 mEq by mouth daily. 12/04/17  Yes [provider]  sertraline (ZOLOFT) 50 MG tablet Take 1 tablet (50 mg total) by mouth daily. 08/08/16  Yes Rainey Pines, MD  traMADol (ULTRAM) 50 MG tablet Take 1 tablet (50 mg total) by mouth every 6 (six) hours as needed. 01/06/17 01/06/18 Yes Lavonia Drafts, MD  aspirin EC 81 MG tablet Take 81 mg by mouth daily.    [provider]  baclofen (LIORESAL) 10 MG tablet Take 10 mg by mouth daily.  10/03/16   [provider]  cyclobenzaprine (FLEXERIL) 10 MG tablet Take by mouth. 05/18/16   [provider]  gabapentin (NEURONTIN) 300 MG capsule TAKE 1 CAPSULE(300 MG) BY MOUTH THREE TIMES DAILY 08/02/16   [provider]  hydrochlorothiazide (HYDRODIURIL) 25 MG tablet Take by mouth.    [provider]  pantoprazole (PROTONIX) 40 MG tablet Take 1 tablet (40 mg total) by mouth 2 (two) times daily. 09/22/16   Max Sane, MD  potassium chloride SA (K-DUR,KLOR-CON) 20 MEQ tablet Take 20 mEq by mouth 2 (two) times daily. 06/29/16   [provider]  simvastatin (ZOCOR) 20 MG tablet Take 20 mg by mouth at bedtime. Reported on 04/14/2015    [provider]  sucralfate (CARAFATE) 1 g tablet Take 1 tablet (1 g total) by mouth 4 (four) times daily. Patient not taking: Reported on 12/09/2017 09/22/16   Max Sane, MD      PHYSICAL EXAMINATION:   VITAL SIGNS: Blood pressure 132/82, pulse (!) 110, temperature 98 F (36.7 C), temperature source Oral, resp. rate (!) 23, height 5' (1.524 m), weight 61.2 kg, SpO2 97 %.  GENERAL:  62 y.o.-year-old patient  lying in the bed with no acute distress.  Frail-appearing EYES: Pupils equal, round, reactive to light and accommodation. No scleral icterus. Extraocular muscles intact.  HEENT: Head atraumatic, normocephalic. Oropharynx and nasopharynx clear.  NECK:  Supple, no jugular venous distention. No thyroid enlargement, no tenderness.  LUNGS: Normal breath sounds bilaterally, no wheezing, rales,rhonchi or crepitation. No use of accessory muscles of respiration.  CARDIOVASCULAR: Irregular rate and rhythm, S1, S2 normal. No rubs, or gallops.  ABDOMEN: Soft, nontender, nondistended. Bowel sounds present. No organomegaly or mass.  EXTREMITIES: No pedal edema, cyanosis, or clubbing.  NEUROLOGIC: Cranial nerves II through XII are intact. Muscle strength 5/5 in all extremities. Sensation intact. Gait not checked.  PSYCHIATRIC: The patient is alert and oriented x 3.  Anxious appearing SKIN: No obvious rash, lesion, or ulcer.   LABORATORY PANEL:   CBC Recent Labs  Lab 12/09/17 1326  WBC  8.0  HGB 13.5  HCT 39.8  PLT 212  MCV 109.3*  MCH 37.1*  MCHC 33.9  RDW 13.3   ------------------------------------------------------------------------------------------------------------------  Chemistries  Recent Labs  Lab 12/09/17 1326  NA 141  K 3.7  CL 106  CO2 23  GLUCOSE 108*  BUN 24*  CREATININE 0.40*  CALCIUM 9.0  AST 53*  ALT 10  ALKPHOS 159*  BILITOT 1.1   ------------------------------------------------------------------------------------------------------------------ estimated creatinine clearance is 74.7 mL/min (A) (by C-G formula based on SCr of 0.4 mg/dL (L)). ------------------------------------------------------------------------------------------------------------------ No results for input(s): TSH, T4TOTAL, T3FREE, THYROIDAB in the last 72 hours.  Invalid input(s): FREET3   Coagulation profile No results for input(s): INR, PROTIME in the last 168  hours. ------------------------------------------------------------------------------------------------------------------- No results for input(s): DDIMER in the last 72 hours. -------------------------------------------------------------------------------------------------------------------  Cardiac Enzymes No results for input(s): CKMB, TROPONINI, MYOGLOBIN in the last 168 hours.  Invalid input(s): CK ------------------------------------------------------------------------------------------------------------------ Invalid input(s): POCBNP  ---------------------------------------------------------------------------------------------------------------  Urinalysis    Component Value Date/Time   COLORURINE YELLOW (A) 09/20/2016 1011   APPEARANCEUR CLEAR (A) 09/20/2016 1011   LABSPEC 1.006 09/20/2016 1011   PHURINE 6.0 09/20/2016 1011   GLUCOSEU NEGATIVE 09/20/2016 1011   HGBUR NEGATIVE 09/20/2016 [place] 09/20/2016 1011   KETONESUR NEGATIVE 09/20/2016 1011   PROTEINUR NEGATIVE 09/20/2016 1011   NITRITE NEGATIVE 09/20/2016 1011   LEUKOCYTESUR NEGATIVE 09/20/2016 1011     RADIOLOGY: No results found.  EKG: Orders placed or performed during the hospital encounter of 12/09/17  . EKG 12-Lead  . EKG 12-Lead    IMPRESSION AND PLAN:  *Acute a flutter with RVR Noted history of A. fib not on oral anticoagulation due to GI bleeding/PUD, not on AV nodal blocking agents chronically Admit to telemetry bed, give Lopressor 5 mg IV x1 now, start Lopressor twice daily, supplemental oxygen PRN, rule out acute coronary syndrome with cardiac enzymes x3 sets, nitrates as needed, supplemental oxygen with weaning as tolerated PRN, check echocardiogram, cardiology to see  Noted echocardiogram from January 2018-ejection fraction 60% with mildly dilated right ventricle   *Acute melena with history of upper GI bleeding Admit to regular nursing for bed, Protonix drip, clear  liquids for now, n.p.o. after midnight, avoid antiplatelet/anticoagulant/NSAID medications, gastroenterology to see, H&H every 6 hours, CBC daily, transfuse as needed  *Chronic Parkinson's Increase nursing care PRN, aspiration/fall/skin care precautions while in house, continue Sinemet  *Chronic benign essential hypertension Stable Hold lisinopril/hydrochlorothiazide, start beta-blocker therapy as stated above, vitals per routine, make changes as per necessary   *Chronic hyperlipidemia, unspecified Continue statin therapy  *Chronic pain syndrome Stable Continue tramadol, Flexeril, Neurontin  *Acute GAD Continue Zoloft, add Valium.  All the records are reviewed and case discussed with ED provider. Management plans discussed with the patient, family and they are in agreement.  CODE STATUS: Full Code Status History    Date Active Date Inactive Code Status Order ID Comments User Context   09/20/2016 0944 09/22/2016 1726 Full Code [id number]  Hillary Bow, MD ED   03/15/2016 1409 03/15/2016 1748 Full Code [id number]  Hessie Knows, MD Inpatient   01/28/2016 0123 01/31/2016 1619 Full Code [id number]  Lance Coon, MD Inpatient       TOTAL TIME TAKING CARE OF THIS PATIENT: 45 minutes.    Avel Peace Lauranne Beyersdorf M.D on 12/09/2017   Between 7am to 6pm - Pager - [phone]  After 6pm go to [email or web address] - password [email or web address] [place]  Office  [phone]  CC: Primary care physician; Dawson,  Cherlyn Labella, MD   Note: This dictation was prepared with Dragon dictation along with smaller phrase technology. Any transcriptional errors that result from this process are unintentional.

## 2017-12-09 NOTE — Progress Notes (Signed)
Family Meeting Note  Advance Directive:yes  Today a meeting took place with the Patient.  Patient is able to participate   The following clinical team members were present during this meeting:MD  The following were discussed:Patient's diagnosis: gib, afib, Patient's progosis: Unable to determine and Goals for treatment: Full Code  Additional follow-up to be provided: prn  Time spent during discussion:20 minutes  Gorden Harms, MD

## 2017-12-09 NOTE — ED Provider Notes (Signed)
[facility] Emergency Department Provider Note  ____________________________________________   I have reviewed the triage vital signs and the nursing notes.   HISTORY  Chief Complaint GI Bleeding   History limited by: Not Limited   HPI Charles Stanton is a 62 y.o. male who presents to the emergency department today because of concern for black stools. The patient states that he first noticed it yesterday. He states that it is watery. He denies any abdominal pain with this. He has never had lower gi bleed before but family states he did throw up dark blood at one point. He has not had any fevers. Has chronic shortness of breath but no change in his shortness of breath. No chest pain.    Per medical record review patient has a history of anxiety, anemia, parkinson's disease  Past Medical History:  Diagnosis Date  . Abnormal liver function   . Anemia   . Anxiety   . Atrial fibrillation ([place])   . Colonic mass   . Depression   . Dizziness   . Dry cough   . History of colon polyps   . Hypercholesteremia   . Hyperlipemia   . Hypertension   . Palpitations   . Parkinson's disease ([place])   . Parkinson's disease ([place])   . PVD (peripheral vascular disease) ([place])   . Tremors of nervous system     Patient Active Problem List   Diagnosis Date Noted  . Esophageal obstruction 10/10/2016  . Esophageal ulceration 10/10/2016  . GI bleed 09/20/2016  . Low vitamin B12 level 03/18/2016  . S/P kyphoplasty 03/18/2016  . Basal cell carcinoma 02/08/2016  . Atrial fibrillation ([place]) 01/27/2016  . Colitis 01/27/2016  . HLD (hyperlipidemia) 01/27/2016  . Anxiety 01/27/2016  . Hypokalemia 01/27/2016  . Major depressive disorder, recurrent episode, moderate ([place]) 04/14/2015  . Anxiety, generalized 12/06/2013  . Parkinson disease ([place]) 08/07/2013  . Dysphagia 05/12/2013  . Esophageal mass 05/12/2013  . GERD (gastroesophageal reflux disease) 05/12/2013  . Vomiting  05/12/2013  . Open bite of lower leg 05/10/2013  . Dog bite of lower leg 05/10/2013  . Abnormal finding on liver function 03/25/2013  . Benign neoplasm 03/25/2013  . Dizziness 03/25/2013  . Type 2 diabetes mellitus ([address]) 03/25/2013  . BP (high blood pressure) 03/25/2013  . Awareness of heartbeats 03/25/2013  . Pure hypercholesterolemia 03/25/2013  . Infective tonsillitis 03/25/2013  . Adenomatous polyp 03/25/2013    Past Surgical History:  Procedure Laterality Date  . BACK SURGERY     Kyphoplasty   March 2018  . COLONOSCOPY    . ESOPHAGOGASTRODUODENOSCOPY (EGD) WITH PROPOFOL N/A 09/21/2016   Procedure: ESOPHAGOGASTRODUODENOSCOPY (EGD) WITH PROPOFOL;  Surgeon: Jonathon Bellows, MD;  Location: [facility] ENDOSCOPY;  Service: Gastroenterology;  Laterality: N/A;  . ESOPHAGOGASTRODUODENOSCOPY (EGD) WITH PROPOFOL N/A 11/07/2016   Procedure: ESOPHAGOGASTRODUODENOSCOPY (EGD) WITH PROPOFOL;  Surgeon: Jonathon Bellows, MD;  Location: [facility] ENDOSCOPY;  Service: Gastroenterology;  Laterality: N/A;  . HERNIA REPAIR     left inguinal hernia repair as an infant  . KYPHOPLASTY N/A 02/11/2016   Procedure: KYPHOPLASTY  L2,T9;  Surgeon: Hessie Knows, MD;  Location: [facility] ORS;  Service: Orthopedics;  Laterality: N/A;  . KYPHOPLASTY N/A 03/15/2016   Procedure: KYPHOPLASTY L3;  Surgeon: Hessie Knows, MD;  Location: [facility] ORS;  Service: Orthopedics;  Laterality: N/A;  . skin cancer removed    . TONSILLECTOMY      Prior to Admission medications   Medication Sig Start Date End Date Taking? Authorizing Provider  aspirin EC 81 MG tablet Take 81 mg by mouth daily.    [provider]  baclofen (LIORESAL) 10 MG tablet Take 10 mg by mouth daily.  10/03/16   [provider]  carbidopa-levodopa (SINEMET CR) 50-200 MG tablet Take 1 tablet by mouth at bedtime.    [provider]  carbidopa-levodopa (SINEMET IR) 25-100 MG tablet TK 1 T PO four times a day 08/16/16   [provider]  cyclobenzaprine  (FLEXERIL) 10 MG tablet Take by mouth. 05/18/16   [provider]  gabapentin (NEURONTIN) 300 MG capsule TAKE 1 CAPSULE(300 MG) BY MOUTH THREE TIMES DAILY 08/02/16   [provider]  hydrochlorothiazide (HYDRODIURIL) 25 MG tablet Take by mouth.    [provider]  lisinopril (PRINIVIL,ZESTRIL) 20 MG tablet Take 20 mg by mouth daily.     [provider]  omeprazole (PRILOSEC) 20 MG capsule Take 20 mg by mouth daily.  10/01/16   [provider]  pantoprazole (PROTONIX) 40 MG tablet Take 1 tablet (40 mg total) by mouth 2 (two) times daily. 09/22/16   Max Sane, MD  potassium chloride SA (K-DUR,KLOR-CON) 20 MEQ tablet Take 20 mEq by mouth 2 (two) times daily. 06/29/16   [provider]  predniSONE (DELTASONE) 20 MG tablet Take 2 tablets (40 mg total) by mouth daily with breakfast. 01/26/17   Delman Kitten, MD  sertraline (ZOLOFT) 50 MG tablet Take 1 tablet (50 mg total) by mouth daily. 08/08/16   Rainey Pines, MD  simvastatin (ZOCOR) 20 MG tablet Take 20 mg by mouth at bedtime. Reported on 04/14/2015    [provider]  sucralfate (CARAFATE) 1 g tablet Take 1 tablet (1 g total) by mouth 4 (four) times daily. 09/22/16   Max Sane, MD  traMADol (ULTRAM) 50 MG tablet Take 1 tablet (50 mg total) by mouth every 6 (six) hours as needed. 01/06/17 01/06/18  Lavonia Drafts, MD    Allergies Amantadine  Family History  Problem Relation Age of Onset  . Heart Problems Mother   . Dementia Mother     Social History Social History   Tobacco Use  . Smoking status: Never Smoker  . Smokeless tobacco: Never Used  Substance Use Topics  . Alcohol use: Yes    Alcohol/week: 2.0 standard drinks    Types: 2 Cans of beer per week    Comment: Daily 2 beers and shot of whiskey  . Drug use: No    Review of Systems Constitutional: No fever/chills Eyes: No visual changes. ENT: No sore throat. Cardiovascular: Denies chest pain. Respiratory: Denies shortness of  breath. Gastrointestinal: Positive for lower gi bleed.  Genitourinary: Negative for dysuria. Musculoskeletal: Negative for back pain. Skin: Negative for rash. Neurological: Negative for headaches, focal weakness or numbness.  ____________________________________________   PHYSICAL EXAM:  VITAL SIGNS: ED Triage Vitals  Enc Vitals Group     BP 12/09/17 1320 138/79     Pulse Rate 12/09/17 1320 (!) 116     Resp 12/09/17 1320 18     Temp 12/09/17 1320 98 F (36.7 C)     Temp Source 12/09/17 1320 Oral     SpO2 12/09/17 1317 98 %     Weight 12/09/17 1320 135 lb (61.2 kg)     Height 12/09/17 1320 5' (1.524 m)     Head Circumference --      Peak Flow --      Pain Score 12/09/17 1320 0   Constitutional: Alert and oriented.  Eyes:  Conjunctivae are normal.  ENT      Head: Normocephalic and atraumatic.      Nose: No congestion/rhinnorhea.      Mouth/Throat: Mucous membranes are moist.      Neck: No stridor. Hematological/Lymphatic/Immunilogical: No cervical lymphadenopathy. Cardiovascular: Tachycardic, irregular rhythm.  No murmurs, rubs, or gallops. Respiratory: Normal respiratory effort without tachypnea nor retractions. Breath sounds are clear and equal bilaterally. No wheezes/rales/rhonchi. Gastrointestinal: Soft and non tender. No rebound. No guarding.  Rectal: Melenotic stool on glove.  Musculoskeletal: Normal range of motion in all extremities. No lower extremity edema. Neurologic:  Normal speech and language. Tremors.  Skin:  Skin is warm, dry and intact. No rash noted. Psychiatric: Mood and affect are normal. Speech and behavior are normal. Patient exhibits appropriate insight and judgment.  ____________________________________________    LABS (pertinent positives/negatives)  CMP na 141, k 3.7, glu 108, cr 0.40 CBC wbc 8.0, hgb 13.5, plt 212  ____________________________________________   EKG  None  ____________________________________________     RADIOLOGY  None  ____________________________________________   PROCEDURES  Procedures  ____________________________________________   INITIAL IMPRESSION / ASSESSMENT AND PLAN / ED COURSE  Pertinent labs & imaging results that were available during my care of the patient were reviewed by me and considered in my medical decision making (see chart for details).   Patient presented to the emergency department today because of concerns for melanotic stool.  Patient rectal exam does show gross melena on the glove.  Patient's blood work without any significant anemia.  He was somewhat tachycardic here in the emergency department. I do wonder if this is being driven more by anxiety given hemoglobin is within normal limits. Did start IV protonix. Will plan on admission.  ____________________________________________   FINAL CLINICAL IMPRESSION(S) / ED DIAGNOSES  Final diagnoses:  Melena     Note: This dictation was prepared with Dragon dictation. Any transcriptional errors that result from this process are unintentional     Nance Pear, MD 12/09/17 [phone]

## 2017-12-10 DIAGNOSIS — I480 Paroxysmal atrial fibrillation: Secondary | ICD-10-CM

## 2017-12-10 LAB — CBC
HCT: 36.3 % — ABNORMAL LOW (ref 39.0–52.0)
Hemoglobin: 12.3 g/dL — ABNORMAL LOW (ref 13.0–17.0)
MCH: 36.6 pg — ABNORMAL HIGH (ref 26.0–34.0)
MCHC: 33.9 g/dL (ref 30.0–36.0)
MCV: 108 fL — ABNORMAL HIGH (ref 80.0–100.0)
NRBC: 0 % (ref 0.0–0.2)
Platelets: 177 10*3/uL (ref 150–400)
RBC: 3.36 MIL/uL — AB (ref 4.22–5.81)
RDW: 13.5 % (ref 11.5–15.5)
WBC: 6.8 10*3/uL (ref 4.0–10.5)

## 2017-12-10 LAB — IRON AND TIBC
Iron: 156 ug/dL (ref 45–182)
Saturation Ratios: 56 % — ABNORMAL HIGH (ref 17.9–39.5)
TIBC: 277 ug/dL (ref 250–450)
UIBC: 121 ug/dL

## 2017-12-10 LAB — MRSA PCR SCREENING: MRSA BY PCR: NEGATIVE

## 2017-12-10 LAB — FERRITIN: Ferritin: 194 ng/mL (ref 24–336)

## 2017-12-10 LAB — TROPONIN I
Troponin I: <0.03 ng/mL (ref <0.03)
Troponin I: <0.03 ng/mL (ref <0.03)

## 2017-12-10 LAB — FOLATE: Folate: 5.5 ng/mL — ABNORMAL LOW (ref >5.9)

## 2017-12-10 LAB — HEMOGLOBIN AND HEMATOCRIT, BLOOD
HEMATOCRIT: 35.2 % — AB (ref 39.0–52.0)
Hemoglobin: 12 g/dL — ABNORMAL LOW (ref 13.0–17.0)

## 2017-12-10 LAB — VITAMIN B12: Vitamin B-12: 759 pg/mL (ref 180–914)

## 2017-12-10 MED ORDER — DIAZEPAM 2 MG PO TABS: 2.0000 mg | ORAL_TABLET | Freq: Two times a day (BID) | ORAL | Status: DC | PRN

## 2017-12-10 MED ORDER — FOLIC ACID 1 MG PO TABS
1.0000 mg | ORAL_TABLET | Freq: Every day | ORAL | Status: DC
  Administered 2017-12-10 – 2017-12-12 (×3): 1 mg via ORAL
  Filled 2017-12-10 (×3): qty 1

## 2017-12-10 NOTE — Consult Note (Addendum)
Charles Darby, MD [address]  [place]  [place], [place] [address]  Main: [phone]  Fax: [phone] Pager: [phone]   Consultation  Referring Provider:     No ref. provider found Primary Care Physician:  [name], MD Primary Gastroenterologist:  Dr. Vicente Males         Reason for Consultation:     Melena  Date of Admission:  12/09/2017 Date of Consultation:  12/10/2017         HPI:   Charles Stanton is a 62 y.o. Caucasian male with history of Parkinson's, history of hiatal hernia, erosive esophagitis and history of peptic ulcer disease confirmed healing in 2018, chronic paroxysmal A. fib not on anticoagulation, presented to ER yesterday with 3-4 episodes of black tarry stools that started Friday.  Apparently, patient has not been taking any proton pump inhibitor as outpatient even though he was recommended.  He had EGD twice last year which revealed persistent erosive esophagitis but had healing of gastric ulcer.  There was no evidence of H. pylori.  Patient reports having brown bowel movements prior to this episode.  He did not follow-up with Dr. Vicente Males in last 6 months.  His hemoglobin on admission was 13.5, baseline hemoglobin is between 16 and 17, dropped to 12.3 today, MCV 108.  BUN was elevated compared to creatinine.  Patient is kept n.p.o. and was started on pantoprazole drip.  Patient denies having any episodes of melena since admission.  Has been hemodynamically stable.  Patient had chicken broth this afternoon. He denies smoking or alcohol use He denies epigastric pain, nausea or vomiting, chest pain, lightheadedness or shortness of breath Patient is seen by the cardiologist today who did not recommend anticoagulation in the setting of GI bleed  NSAIDs: None  Antiplts/Anticoagulants/Anti thrombotics: Aspirin 81  GI Procedures:   EGD 11/07/2016 - Normal examined duodenum. - Gastritis. Biopsied. - A single lesion suspicious for aberrant pancreas was  found in the stomach. - Congested, increased vascular pattern mucosa in the esophagus. Biopsied. - LA Grade C reflux esophagitis. DIAGNOSIS:  A. STOMACH; COLD BIOPSY:  - ANTRAL-TYPE MUCOSA WITH FEATURES OF A HEALING MUCOSAL INJURY.  - NEGATIVE FOR H. PYLORI, DYSPLASIA AND MALIGNANCY.   B. GE JUNCTION; COLD BIOPSY:  - POLYPOID FOVEOLAR-TYPE GASTRIC MUCOSA, MILDLY INFLAMED.  - NEGATIVE FOR DYSPLASIA AND MALIGNANCY.   09/21/2016 EGD - Normal examined duodenum. - Non-bleeding gastric ulcer with a clean ulcer base ([place] Class III). - Large hiatal hernia. - LA Grade C reflux esophagitis. - Erythema in the upper third of the esophagus. - No specimens collected.   Past Medical History:  Diagnosis Date  . Abnormal liver function   . Anemia   . Anxiety   . Atrial fibrillation ([place])   . Colonic mass   . Depression   . Dizziness   . Dry cough   . History of colon polyps   . Hypercholesteremia   . Hyperlipemia   . Hypertension   . Palpitations   . Parkinson's disease ([place])   . Parkinson's disease ([place])   . PVD (peripheral vascular disease) ([place])   . Tremors of nervous system     Past Surgical History:  Procedure Laterality Date  . BACK SURGERY     Kyphoplasty   March 2018  . COLONOSCOPY    . ESOPHAGOGASTRODUODENOSCOPY (EGD) WITH PROPOFOL N/A 09/21/2016   Procedure: ESOPHAGOGASTRODUODENOSCOPY (EGD) WITH PROPOFOL;  Surgeon: Jonathon Bellows, MD;  Location[facility] ENDOSCOPY;  Service: Gastroenterology;  Laterality: N/A;  . ESOPHAGOGASTRODUODENOSCOPY (EGD) WITH PROPOFOL N/A 11/07/2016   Procedure: ESOPHAGOGASTRODUODENOSCOPY (EGD) WITH PROPOFOL;  Surgeon: Jonathon Bellows, MD;  Location[facility] ENDOSCOPY;  Service: Gastroenterology;  Laterality: N/A;  . HERNIA REPAIR     left inguinal hernia repair as an infant  . KYPHOPLASTY N/A 02/11/2016   Procedure: KYPHOPLASTY  L2,T9;  Surgeon: Hessie Knows, MD;  Location: [facility] ORS;  Service: Orthopedics;  Laterality: N/A;  . KYPHOPLASTY N/A 03/15/2016    Procedure: KYPHOPLASTY L3;  Surgeon: Hessie Knows, MD;  Location: [facility] ORS;  Service: Orthopedics;  Laterality: N/A;  . skin cancer removed    . TONSILLECTOMY      Prior to Admission medications   Medication Sig Start Date End Date Taking? Authorizing Provider  carbidopa-levodopa (SINEMET CR) 50-200 MG tablet Take 1 tablet by mouth at bedtime.   Yes [provider]  carbidopa-levodopa (SINEMET IR) 25-100 MG tablet TK 1 T PO four times a day 08/16/16  Yes [provider]  lisinopril (PRINIVIL,ZESTRIL) 20 MG tablet Take 20 mg by mouth daily.    Yes [provider]  omeprazole (PRILOSEC) 20 MG capsule Take 20 mg by mouth daily.  10/01/16  Yes [provider]  potassium chloride 20 MEQ/15ML (10%) SOLN Take 20 mEq by mouth daily. 12/04/17  Yes [provider]  sertraline (ZOLOFT) 50 MG tablet Take 1 tablet (50 mg total) by mouth daily. 08/08/16  Yes Rainey Pines, MD  traMADol (ULTRAM) 50 MG tablet Take 1 tablet (50 mg total) by mouth every 6 (six) hours as needed. 01/06/17 01/06/18 Yes Lavonia Drafts, MD  aspirin EC 81 MG tablet Take 81 mg by mouth daily.    [provider]  baclofen (LIORESAL) 10 MG tablet Take 10 mg by mouth daily.  10/03/16   [provider]  cyclobenzaprine (FLEXERIL) 10 MG tablet Take by mouth. 05/18/16   [provider]  gabapentin (NEURONTIN) 300 MG capsule TAKE 1 CAPSULE(300 MG) BY MOUTH THREE TIMES DAILY 08/02/16   [provider]  hydrochlorothiazide (HYDRODIURIL) 25 MG tablet Take by mouth.    [provider]  pantoprazole (PROTONIX) 40 MG tablet Take 1 tablet (40 mg total) by mouth 2 (two) times daily. 09/22/16   Max Sane, MD  potassium chloride SA (K-DUR,KLOR-CON) 20 MEQ tablet Take 20 mEq by mouth 2 (two) times daily. 06/29/16   [provider]  simvastatin (ZOCOR) 20 MG tablet Take 20 mg by mouth at bedtime. Reported on 04/14/2015    [provider]  sucralfate (CARAFATE) 1  g tablet Take 1 tablet (1 g total) by mouth 4 (four) times daily. Patient not taking: Reported on 12/09/2017 09/22/16   Max Sane, MD    Current Facility-Administered Medications:  .  0.9 %  sodium chloride infusion, 250 mL, Intravenous, PRN, Salary, Montell D, MD .  acetaminophen (TYLENOL) tablet 650 mg, 650 mg, Oral, Q6H PRN **OR** acetaminophen (TYLENOL) suppository 650 mg, 650 mg, Rectal, Q6H PRN, Salary, Montell D, MD .  baclofen (LIORESAL) tablet 10 mg, 10 mg, Oral, Daily, Salary, Montell D, MD, 10 mg at 12/10/17 0932 .  carbidopa-levodopa (SINEMET CR) 50-200 MG per tablet controlled release 1 tablet, 1 tablet, Oral, QHS, Salary, Montell D, MD, 1 tablet at 12/09/17 2336 .  carbidopa-levodopa (SINEMET IR) 25-100 MG per tablet immediate release 1 tablet, 1 tablet, Oral, QID, Salary, Montell D, MD, 1 tablet at 12/10/17 1407 .  cyclobenzaprine (FLEXERIL) tablet 5 mg, 5 mg, Oral, TID PRN, Salary, Avel Peace, MD .  diazepam (VALIUM) tablet 2 mg, 2 mg, Oral, Q12H PRN, Fritzi Mandes, MD .  gabapentin (NEURONTIN) capsule 100 mg, 100 mg, Oral, TID, Salary, Montell D, MD, 100 mg at 12/09/17 2338 .  metoprolol tartrate (LOPRESSOR) injection 2.5 mg, 2.5 mg, Intravenous, Q6H PRN, Salary, Montell D, MD, 2.5 mg at 12/09/17 1847 .  metoprolol tartrate (LOPRESSOR) tablet 50 mg, 50 mg, Oral, BID, Salary, Montell D, MD, 50 mg at 12/10/17 0931 .  nitroGLYCERIN (NITROSTAT) SL tablet 0.4 mg, 0.4 mg, Sublingual, Q5 min PRN, Salary, Montell D, MD .  ondansetron (ZOFRAN) tablet 4 mg, 4 mg, Oral, Q6H PRN **OR** ondansetron (ZOFRAN) injection 4 mg, 4 mg, Intravenous, Q6H PRN, Salary, Montell D, MD, 4 mg at 12/09/17 2210 .  pantoprazole (PROTONIX) 80 mg in sodium chloride 0.9 % 250 mL (0.32 mg/mL) infusion, 8 mg/hr, Intravenous, Continuous, Nance Pear, MD, Last Rate: 25 mL/hr at 12/10/17 1408, 8 mg/hr at 12/10/17 1408 .  [START ON 12/13/2017] pantoprazole (PROTONIX) injection 40 mg, 40 mg, Intravenous, Q12H, Nance Pear, MD .  potassium chloride 20 MEQ/15ML (10%) solution 20 mEq, 20 mEq, Oral, Daily, Salary, Montell D, MD, 20 mEq at 12/10/17 0935 .  sertraline (ZOLOFT) tablet 50 mg, 50 mg, Oral, Daily, Salary, Montell D, MD, 50 mg at 12/10/17 0932 .  simvastatin (ZOCOR) tablet 20 mg, 20 mg, Oral, QHS, Salary, Montell D, MD, 20 mg at 12/09/17 2338 .  sodium chloride flush (NS) 0.9 % injection 3 mL, 3 mL, Intravenous, Q12H, Salary, Montell D, MD, 3 mL at 12/10/17 0934 .  sodium chloride flush (NS) 0.9 % injection 3 mL, 3 mL, Intravenous, PRN, Salary, Montell D, MD .  sucralfate (CARAFATE) tablet 1 g, 1 g, Oral, QID, Salary, Montell D, MD, 1 g at 12/10/17 1407 .  traMADol (ULTRAM) tablet 50 mg, 50 mg, Oral, Q6H PRN, Salary, Montell D, MD  Family History  Problem Relation Age of Onset  . Heart Problems Mother   . Dementia Mother      Social History   Tobacco Use  . Smoking status: Never Smoker  . Smokeless tobacco: Never Used  Substance Use Topics  . Alcohol use: Yes    Alcohol/week: 2.0 standard drinks    Types: 2 Cans of beer per week    Comment: Daily 2 beers and shot of whiskey  . Drug use: No    Allergies as of 12/09/2017 - Review Complete 12/09/2017  Allergen Reaction Noted  . Amantadine Other (See Comments) 02/05/2014    Review of Systems:    All systems reviewed and negative except where noted in HPI.   Physical Exam:  Vital signs in last 24 hours: Temp:  [98.2 F (36.8 C)-99.1 F (37.3 C)] 99.1 F (37.3 C) (12/08 1225) Pulse Rate:  [73-145] 73 (12/08 1225) Resp:  [18-24] 18 (12/08 1225) BP: (99-132)/(60-98) 108/68 (12/08 1225) SpO2:  [97 %-98 %] 97 % (12/08 1225) Weight:  [61.2 kg] 61.2 kg (12/07 1730) Last BM Date: 12/10/17 General:   Pleasant, cooperative in NAD Head:  Normocephalic and atraumatic. Eyes:   No icterus.   Conjunctiva pink. PERRLA. Ears:  Normal auditory acuity. Neck:  Supple; no masses or thyroidomegaly Lungs: Respirations even and unlabored. Lungs  clear to auscultation bilaterally.   No wheezes, crackles, or rhonchi.  Heart:  Regular rate and rhythm;  Without murmur, clicks, rubs or gallops Abdomen:  Soft, nondistended, nontender. Normal bowel sounds. No appreciable masses or hepatomegaly.  No rebound or guarding.  Rectal:  Not performed.  Msk:  Symmetrical without gross deformities.  Involuntary tremors in his right upper extremity, with muscle wasting in his fingers Extremities:  Without edema, cyanosis or clubbing. Neurologic:  Alert and oriented x3;  grossly normal neurologically. Skin:  Intact without significant lesions or rashes. Psych:  Alert and cooperative. Normal affect.  LAB RESULTS: CBC Latest Ref Rng & Units 12/10/2017 12/10/2017 12/09/2017  WBC 4.0 - 10.5 K/uL 6.8 - -  Hemoglobin 13.0 - 17.0 g/dL 12.3(L) 12.0(L) 12.8(L)  Hematocrit 39.0 - 52.0 % 36.3(L) 35.2(L) 37.5(L)  Platelets 150 - 400 K/uL 177 - -    BMET BMP Latest Ref Rng & Units 12/09/2017 10/04/2017 05/27/2017  Glucose 70 - 99 mg/dL 108(H) 95 102(H)  BUN 8 - 23 mg/dL 24(H) 6(L) <5(L)  Creatinine 0.61 - 1.24 mg/dL 0.40(L) 0.45(L) 0.38(L)  BUN/Creat Ratio 10 - 24 - - -  Sodium 135 - 145 mmol/L 141 142 140  Potassium 3.5 - 5.1 mmol/L 3.7 3.1(L) 3.2(L)  Chloride 98 - 111 mmol/L 106 103 104  CO2 22 - 32 mmol/L '23 27 27  '$ Calcium 8.9 - 10.3 mg/dL 9.0 8.4(L) 8.9    LFT Hepatic Function Latest Ref Rng & Units 12/09/2017 10/04/2017 05/27/2017  Total Protein 6.5 - 8.1 g/dL 6.7 6.9 7.2  Albumin 3.5 - 5.0 g/dL 3.6 3.7 3.8  AST 15 - 41 U/L 53(H) 164(H) 92(H)  ALT 0 - 44 U/L 10 32 29  Alk Phosphatase 38 - 126 U/L 159(H) 163(H) 147(H)  Total Bilirubin 0.3 - 1.2 mg/dL 1.1 1.5(H) 1.2     STUDIES: No results found.    Impression / Plan:   Charles Stanton is a 62 y.o. Caucasian male with history of A. fib with RVR, currently rate controlled on beta-blocker, not on anticoagulation, Parkinson's, known history of gastric ulcer, erosive esophagitis as well as hiatal hernia  and not on proton pump inhibitor admitted with 1 day history of melena and mild anemia.  He does have elevated BUN to creatinine ratio which is highly suggestive of upper GI bleed.  Continue pantoprazole drip N.p.o. past midnight EGD tomorrow to be performed by Dr. Vicente Males Strongly advised patient that he should stay on long-term proton pump inhibitor as outpatient He does have folate deficiency, start folic acid 1 mg daily  Thank you for involving me in the care of this patient.  Dr. Vicente Males to cover from tomorrow    LOS: 1 day   Sherri Sear, MD  12/10/2017, 3:16 PM   Note: This dictation was prepared with Dragon dictation along with smaller phrase technology. Any transcriptional errors that result from this process are unintentional.

## 2017-12-10 NOTE — Consult Note (Signed)
[facility] Cardiology  CARDIOLOGY CONSULT NOTE  Patient ID: Charles Stanton MRN: [id number] DOB/AGE: 07-15-1955 62 y.o.  Admit date: 12/09/2017 Referring Physician Posey Pronto Primary Physician [facility] Primary Cardiologist Fath Reason for Consultation atrial fibrillation  HPI: 62 year old gentleman referred for evaluation of atrial fibrillation with rapid ventricular rate.  Presents to [facility] emergency room with recent history of black tarry stools.  Admission hemoglobin macro 13.5 and 39.8.  Patient noted to be tachycardic.  EKG reveals atrial fibrillation at a rate of 111 bpm.  Patient has known history of paroxysmal atrial fibrillation, followed by Dr. Ubaldo Glassing.  The echocardiogram 11/27/2017 revealed normal left ventricular function, with LVEF greater than 55%, with moderate mitral rotation.  Patient has a chads vas score of 1, and currently is not on chronic anticoagulation.  He has known history of alcohol abuse as well as Parkinson's.  Patient was started on metoprolol tartrate 50 mg twice daily and heart rate is much improved.  Review of systems complete and found to be negative unless listed above     Past Medical History:  Diagnosis Date  . Abnormal liver function   . Anemia   . Anxiety   . Atrial fibrillation ([place])   . Colonic mass   . Depression   . Dizziness   . Dry cough   . History of colon polyps   . Hypercholesteremia   . Hyperlipemia   . Hypertension   . Palpitations   . Parkinson's disease ([place])   . Parkinson's disease ([place])   . PVD (peripheral vascular disease) ([place])   . Tremors of nervous system     Past Surgical History:  Procedure Laterality Date  . BACK SURGERY     Kyphoplasty   March 2018  . COLONOSCOPY    . ESOPHAGOGASTRODUODENOSCOPY (EGD) WITH PROPOFOL N/A 09/21/2016   Procedure: ESOPHAGOGASTRODUODENOSCOPY (EGD) WITH PROPOFOL;  Surgeon: Jonathon Bellows, MD;  Location: [facility] ENDOSCOPY;  Service: Gastroenterology;  Laterality: N/A;  . ESOPHAGOGASTRODUODENOSCOPY (EGD) WITH  PROPOFOL N/A 11/07/2016   Procedure: ESOPHAGOGASTRODUODENOSCOPY (EGD) WITH PROPOFOL;  Surgeon: Jonathon Bellows, MD;  Location: [facility] ENDOSCOPY;  Service: Gastroenterology;  Laterality: N/A;  . HERNIA REPAIR     left inguinal hernia repair as an infant  . KYPHOPLASTY N/A 02/11/2016   Procedure: KYPHOPLASTY  L2,T9;  Surgeon: Hessie Knows, MD;  Location: [facility] ORS;  Service: Orthopedics;  Laterality: N/A;  . KYPHOPLASTY N/A 03/15/2016   Procedure: KYPHOPLASTY L3;  Surgeon: Hessie Knows, MD;  Location: [facility] ORS;  Service: Orthopedics;  Laterality: N/A;  . skin cancer removed    . TONSILLECTOMY      Medications Prior to Admission  Medication Sig Dispense Refill Last Dose  . carbidopa-levodopa (SINEMET CR) 50-200 MG tablet Take 1 tablet by mouth at bedtime.   prn at prn  . carbidopa-levodopa (SINEMET IR) 25-100 MG tablet TK 1 T PO four times a day  1 12/09/2017 at 0800  . lisinopril (PRINIVIL,ZESTRIL) 20 MG tablet Take 20 mg by mouth daily.    12/08/2017 at 0800  . omeprazole (PRILOSEC) 20 MG capsule Take 20 mg by mouth daily.   2 prn at prn  . potassium chloride 20 MEQ/15ML (10%) SOLN Take 20 mEq by mouth daily.   12/08/2017 at 0800  . sertraline (ZOLOFT) 50 MG tablet Take 1 tablet (50 mg total) by mouth daily. 90 tablet 1 12/08/2017 at 0800  . traMADol (ULTRAM) 50 MG tablet Take 1 tablet (50 mg total) by mouth every 6 (six) hours as needed. 20 tablet 0 prn  at prn  . aspirin EC 81 MG tablet Take 81 mg by mouth daily.   Not Taking at Unknown time  . baclofen (LIORESAL) 10 MG tablet Take 10 mg by mouth daily.   0 Not Taking at Unknown time  . cyclobenzaprine (FLEXERIL) 10 MG tablet Take by mouth.   Not Taking at Unknown time  . gabapentin (NEURONTIN) 300 MG capsule TAKE 1 CAPSULE(300 MG) BY MOUTH THREE TIMES DAILY   Not Taking at Unknown time  . hydrochlorothiazide (HYDRODIURIL) 25 MG tablet Take by mouth.   Not Taking at Unknown time  . pantoprazole (PROTONIX) 40 MG tablet Take 1 tablet (40 mg total) by mouth 2  (two) times daily. 60 tablet 0 prn at prn  . potassium chloride SA (K-DUR,KLOR-CON) 20 MEQ tablet Take 20 mEq by mouth 2 (two) times daily.   Not Taking at Unknown time  . simvastatin (ZOCOR) 20 MG tablet Take 20 mg by mouth at bedtime. Reported on 04/14/2015   Not Taking at Unknown time  . sucralfate (CARAFATE) 1 g tablet Take 1 tablet (1 g total) by mouth 4 (four) times daily. (Patient not taking: Reported on 12/09/2017) 120 tablet 0 Not Taking at 0800   Social History   Socioeconomic History  . Marital status: Married    Spouse name: Not on file  . Number of children: Not on file  . Years of education: Not on file  . Highest education level: Not on file  Occupational History  . Not on file  Social Needs  . Financial resource strain: Not on file  . Food insecurity:    Worry: Not on file    Inability: Not on file  . Transportation needs:    Medical: Not on file    Non-medical: Not on file  Tobacco Use  . Smoking status: Never Smoker  . Smokeless tobacco: Never Used  Substance and Sexual Activity  . Alcohol use: Yes    Alcohol/week: 2.0 standard drinks    Types: 2 Cans of beer per week    Comment: Daily 2 beers and shot of whiskey  . Drug use: No  . Sexual activity: Yes  Lifestyle  . Physical activity:    Days per week: Not on file    Minutes per session: Not on file  . Stress: Not on file  Relationships  . Social connections:    Talks on phone: Not on file    Gets together: Not on file    Attends religious service: Not on file    Active member of club or organization: Not on file    Attends meetings of clubs or organizations: Not on file    Relationship status: Not on file  . Intimate partner violence:    Fear of current or ex partner: Not on file    Emotionally abused: Not on file    Physically abused: Not on file    Forced sexual activity: Not on file  Other Topics Concern  . Not on file  Social History Narrative  . Not on file    Family History  Problem  Relation Age of Onset  . Heart Problems Mother   . Dementia Mother       Review of systems complete and found to be negative unless listed above      PHYSICAL EXAM  General: Well developed, well nourished, in no acute distress HEENT:  Normocephalic and atramatic Neck:  No JVD.  Lungs: Clear bilaterally to auscultation and percussion. Heart: HRRR .  Normal S1 and S2 without gallops or murmurs.  Abdomen: Bowel sounds are positive, abdomen soft and non-tender  Msk:  Back normal, normal gait. Normal strength and tone for age. Extremities: No clubbing, cyanosis or edema.   Neuro: Alert and oriented X 3. Psych:  Good affect, responds appropriately  Labs:   Lab Results  Component Value Date   WBC 6.8 12/10/2017   HGB 12.3 (L) 12/10/2017   HCT 36.3 (L) 12/10/2017   MCV 108.0 (H) 12/10/2017   PLT 177 12/10/2017    Recent Labs  Lab 12/09/17 1326  NA 141  K 3.7  CL 106  CO2 23  BUN 24*  CREATININE 0.40*  CALCIUM 9.0  PROT 6.7  BILITOT 1.1  ALKPHOS 159*  ALT 10  AST 53*  GLUCOSE 108*   Lab Results  Component Value Date   TROPONINI <0.03 12/10/2017   No results found for: CHOL No results found for: HDL No results found for: LDLCALC No results found for: TRIG No results found for: CHOLHDL No results found for: LDLDIRECT    Radiology: No results found.  EKG: Atrial fibrillation with a rate of 111 bpm  ASSESSMENT AND PLAN:   1.  Atrial fibrillation with rapid ventricular rate the setting of probable upper GI bleed.  Much improved on metoprolol tartrate 50 mg twice daily.  Patient asymptomatic.  Patient has chads vas score of 1.  2D echocardiogram revealed normal left ventricular function. 2.  GI bleed  Recommendations  1.  Agree with current therapy 2.  Defer chronic anticoagulation, especially in light of GI bleed 3.  Continue metoprolol tartrate for rate control 4.  Defer further cardiac diagnostics at this time    Signed: Isaias Cowman MD,PhD,  [facility] 12/10/2017, 8:59 AM

## 2017-12-10 NOTE — Progress Notes (Signed)
[facility] [place] NAME: Charles Stanton    MR#:  [id number]  DATE OF BIRTH:  1955/11/13  SUBJECTIVE:   Came in with melena. Not taking PPI Wife in the room Chronic anxiety REVIEW OF SYSTEMS:   Review of Systems  Constitutional: Negative for chills, fever and weight loss.  HENT: Negative for ear discharge, ear pain and nosebleeds.   Eyes: Negative for blurred vision, pain and discharge.  Respiratory: Negative for sputum production, shortness of breath, wheezing and stridor.   Cardiovascular: Negative for chest pain, palpitations, orthopnea and PND.  Gastrointestinal: Negative for abdominal pain, diarrhea, nausea and vomiting.  Genitourinary: Negative for frequency and urgency.  Musculoskeletal: Negative for back pain and joint pain.  Neurological: Positive for tremors and weakness. Negative for sensory change, speech change and focal weakness.  Psychiatric/Behavioral: Negative for depression and hallucinations. The patient is nervous/anxious.    Tolerating Diet:yes Tolerating PT: not needed  DRUG ALLERGIES:   Allergies  Allergen Reactions  . Amantadine Other (See Comments)    fatigue sleepy Other reaction(s): Other (See Comments) fatigue sleepy fatigue sleepy fatigue sleepy    VITALS:  Blood pressure 108/68, pulse 73, temperature 99.1 F (37.3 C), temperature source Oral, resp. rate 18, height 5' (1.524 m), weight 61.2 kg, SpO2 97 %.  PHYSICAL EXAMINATION:   Physical Exam  GENERAL:  62 y.o.-year-old patient lying in the bed with no acute distress.  EYES: Pupils equal, round, reactive to light and accommodation. No scleral icterus. Extraocular muscles intact.  HEENT: Head atraumatic, normocephalic. Oropharynx and nasopharynx clear.  NECK:  Supple, no jugular venous distention. No thyroid enlargement, no tenderness.  LUNGS: Normal breath sounds bilaterally, no wheezing, rales, rhonchi. No use of accessory muscles of  respiration.  CARDIOVASCULAR: S1, S2 normal. No murmurs, rubs, or gallops.  ABDOMEN: Soft, nontender, nondistended. Bowel sounds present. No organomegaly or mass.  EXTREMITIES: No cyanosis, clubbing or edema b/l.    NEUROLOGIC: Cranial nerves II through XII are intact. No focal Motor or sensory deficits b/l.   PSYCHIATRIC:  patient is alert and oriented x 3. Anxious SKIN: No obvious rash, lesion, or ulcer.   LABORATORY PANEL:  CBC Recent Labs  Lab 12/10/17 0616  WBC 6.8  HGB 12.3*  HCT 36.3*  PLT 177    Chemistries  Recent Labs  Lab 12/09/17 1326  NA 141  K 3.7  CL 106  CO2 23  GLUCOSE 108*  BUN 24*  CREATININE 0.40*  CALCIUM 9.0  AST 53*  ALT 10  ALKPHOS 159*  BILITOT 1.1   Cardiac Enzymes Recent Labs  Lab 12/10/17 0616  TROPONINI <0.03   RADIOLOGY:  No results found. ASSESSMENT AND PLAN:  Keymarion Bearman  is a 62 y.o. male with a known history per below which also includes history of upper GI bleeding, presented emergency room with black stools since yesterday, in the emergency room patient was found to have black stool on presentation, hemoglobin stable at 13.5, noted tachycardia-heart rate up to the 150s intermittently, EKG noted for atrial flutter  *Acute a flutter with RVR Noted history of A. fib not on oral anticoagulation due to GI bleeding/PUD, not on AV nodal blocking agents chronically - Lopressor 5 mg IV x1 now, start Lopressor twice daily, supplemental oxygen PRN,  - supplemental oxygen with weaning as tolerated PRN - cardiology input noted -Noted echocardiogram from January 2019-ejection fraction 60% with mildly dilated right ventricle   *Acute melena with history of upper  GI bleeding - Protonix drip, clear liquids for now, n.p.o. after midnight, avoid antiplatelet/anticoagulant/NSAID medications -transfuse as needed --EGD in am -pt NOT taking PPI at home -hgb12.3  *Chronic Parkinson's -continue Sinemet  *Chronic benign essential  hypertension Stable Hold lisinopril/hydrochlorothiazide, start beta-blocker therapy as stated above, vitals per routine, make changes as per necessary   *Chronic hyperlipidemia, unspecified Continue statin therapy  *Chronic pain syndrome Stable Continue tramadol, Flexeril, Neurontin  * GAD Continue Zoloft  Case discussed with Care Management/Social Worker. Management plans discussed with the patient, family and they are in agreement.  CODE STATUS:full  DVT Prophylaxis: SCD  TOTAL TIME TAKING CARE OF THIS PATIENT: *25* minutes.  >50% time spent on counselling and coordination of care  POSSIBLE D/C IN *1-2DAYS, DEPENDING ON CLINICAL CONDITION.  Note: This dictation was prepared with Dragon dictation along with smaller phrase technology. Any transcriptional errors that result from this process are unintentional.  Fritzi Mandes M.D on 12/10/2017 at 4:02 PM  Between 7am to 6pm - Pager - [phone]  After 6pm go to [email or web address] - password [email or web address] [facility]  Office  [phone]  CC: Primary care physician; [name], MDPatient ID: Charles Stanton, male   DOB: Oct 28, 1955, 62 y.o.   MRN: [id number]

## 2017-12-11 ENCOUNTER — Inpatient Hospital Stay: Payer: Medicare Other | Admitting: Anesthesiology

## 2017-12-11 ENCOUNTER — Encounter: Payer: Self-pay | Admitting: Anesthesiology

## 2017-12-11 ENCOUNTER — Encounter
Admission: EM | Disposition: A | Discharge status: Home / Self Care | Payer: Self-pay | Source: Home / Self Care | Attending: Internal Medicine

## 2017-12-11 DIAGNOSIS — K921 Melena: Secondary | ICD-10-CM

## 2017-12-11 HISTORY — PX: ESOPHAGOGASTRODUODENOSCOPY: SHX5428

## 2017-12-11 LAB — HIV ANTIBODY (ROUTINE TESTING W REFLEX): HIV Screen 4th Generation wRfx: NONREACTIVE

## 2017-12-11 SURGERY — EGD (ESOPHAGOGASTRODUODENOSCOPY)
Anesthesia: General

## 2017-12-11 MED ORDER — PROPOFOL 500 MG/50ML IV EMUL
INTRAVENOUS | Status: DC | PRN
  Administered 2017-12-11: 120 ug/kg/min via INTRAVENOUS

## 2017-12-11 MED ORDER — LIDOCAINE 2% (20 MG/ML) 5 ML SYRINGE
INTRAMUSCULAR | Status: DC | PRN
  Administered 2017-12-11: 50 mg via INTRAVENOUS

## 2017-12-11 MED ORDER — PROPOFOL 10 MG/ML IV BOLUS
INTRAVENOUS | Status: DC | PRN
  Administered 2017-12-11: 50 mg via INTRAVENOUS

## 2017-12-11 MED ORDER — PEG 3350-KCL-NA BICARB-NACL 420 G PO SOLR
4000.0000 mL | Freq: Once | ORAL | Status: AC
  Administered 2017-12-11: 4000 mL via ORAL
  Filled 2017-12-11 (×2): qty 4000

## 2017-12-11 MED ORDER — DIAZEPAM 2 MG PO TABS: 1.0000 mg | ORAL_TABLET | Freq: Three times a day (TID) | ORAL | Status: DC | PRN

## 2017-12-11 MED ORDER — PANTOPRAZOLE SODIUM 40 MG PO TBEC
40.0000 mg | DELAYED_RELEASE_TABLET | Freq: Every day | ORAL | Status: DC
  Administered 2017-12-11 – 2017-12-12 (×2): 40 mg via ORAL
  Filled 2017-12-11 (×2): qty 1

## 2017-12-11 MED ORDER — SODIUM CHLORIDE 0.9 % IV SOLN: INTRAVENOUS | Status: DC

## 2017-12-11 NOTE — Progress Notes (Signed)
Endoscopy is sending for the patient.  Wife, Selinda Eon, of the patient notified by me of the time change for patients endoscopy

## 2017-12-11 NOTE — Progress Notes (Addendum)
Order received from Dr Posey Pronto to discontinue IV protonix and start patient on oral protonix

## 2017-12-11 NOTE — Anesthesia Postprocedure Evaluation (Signed)
Anesthesia Post Note  Patient: Charles Stanton  Procedure(s) Performed: ESOPHAGOGASTRODUODENOSCOPY (EGD) (N/A )  Patient location during evaluation: Endoscopy Anesthesia Type: General Level of consciousness: awake and alert Pain management: pain level controlled Vital Signs Assessment: post-procedure vital signs reviewed and stable Respiratory status: spontaneous breathing, nonlabored ventilation, respiratory function stable and patient connected to nasal cannula oxygen Cardiovascular status: blood pressure returned to baseline and stable Postop Assessment: no apparent nausea or vomiting Anesthetic complications: no     Last Vitals:  Vitals:   12/11/17 1118 12/11/17 1229  BP: (!) 96/58 129/76  Pulse: (!) 58 (!) 57  Resp: 18 18  Temp: 36.7 C 37.1 C  SpO2: 96% 100%    Last Pain:  Vitals:   12/11/17 1138  TempSrc:   PainSc: 0-No pain                 Precious Haws Jania Steinke

## 2017-12-11 NOTE — Op Note (Signed)
[facility] Gastroenterology Patient Name: Charles Stanton Procedure Date: 12/11/2017 10:47 AM MRN: [id number] Account #: [id number] Date of Birth: 01-Mar-1955 Admit Type: Outpatient Age: 62 Room: [facility] ENDO ROOM 4 Gender: Male Note Status: Finalized Procedure:            Upper GI endoscopy Indications:          Melena Providers:            Jonathon Bellows MD, MD Referring MD:         [name], MD (Referring MD) Medicines:            Monitored Anesthesia Care Complications:        No immediate complications. Procedure:            Pre-Anesthesia Assessment:                       - Prior to the procedure, a History and Physical was                        performed, and patient medications, allergies and                        sensitivities were reviewed. The patient's tolerance of                        previous anesthesia was reviewed.                       - The risks and benefits of the procedure and the                        sedation options and risks were discussed with the                        patient. All questions were answered and informed                        consent was obtained.                       - ASA Grade Assessment: III - A patient with severe                        systemic disease.                       After obtaining informed consent, the endoscope was                        passed under direct vision. Throughout the procedure,                        the patient's blood pressure, pulse, and oxygen                        saturations were monitored continuously. The Endoscope                        was introduced through the mouth, and advanced to the  third part of duodenum. The upper GI endoscopy was                        accomplished with ease. The patient tolerated the                        procedure well. Findings:      The examined duodenum was normal.      A 5 cm hiatal hernia was present.      The stomach  was normal.      The cardia and gastric fundus were normal on retroflexion.      mucosa appeared thickened were found at the gastroesophageal junction.       Biopsies were taken with a cold forceps for histology.      Circumferential salmon-colored mucosa was present from 30 to 35 cm. No       other visible abnormalities were present. The maximum longitudinal       extent of these esophageal mucosal changes was 5 cm in length. biopsies       not taken today due to admission for acute GI bleed      A single 10 mm submucosal papule (nodule) with no bleeding and no       stigmata of recent bleeding was found in the gastric antrum. suggestive       of pancreatic rest Impression:           - Normal examined duodenum.                       - 5 cm hiatal hernia.                       - Normal stomach.                       - Congested, texture changed mucosa in the esophagus.                        Biopsied.                       - Salmon-colored mucosa suspicious for long-segment                        Barrett's esophagus.                       - A single submucosal papule (nodule) found in the                        stomach. Recommendation:       - Return patient to hospital ward for ongoing care.                       - Clear liquid diet today.                       - Perform a colonoscopy tomorrow. Procedure Code(s):    --- Professional ---                       [phone], Esophagogastroduodenoscopy, flexible, transoral;  with biopsy, single or multiple Diagnosis Code(s):    --- Professional ---                       K22.8, Other specified diseases of esophagus                       K44.9, Diaphragmatic hernia without obstruction or                        gangrene                       K31.89, Other diseases of stomach and duodenum                       K92.1, Melena (includes Hematochezia) CPT copyright 2018 American Medical Association. All rights reserved. The codes  documented in this report are preliminary and upon coder review may  be revised to meet current compliance requirements. Jonathon Bellows, MD Jonathon Bellows MD, MD 12/11/2017 11:14:30 AM This report has been signed electronically. Number of Addenda: 0 Note Initiated On: 12/11/2017 10:47 AM      [facility]

## 2017-12-11 NOTE — H&P (Signed)
Jonathon Bellows, MD [address], [place], [place], [place], [address] [address], [place], [place], [place], [address] Phone: [phone]  Fax: [phone]  Primary Care Physician:  [name], MD   Pre-Procedure History & Physical: HPI:  Charles Stanton is a 62 y.o. male is here for an endoscopy    Past Medical History:  Diagnosis Date  . Abnormal liver function   . Anemia   . Anxiety   . Atrial fibrillation ([place])   . Colonic mass   . Depression   . Dizziness   . Dry cough   . History of colon polyps   . Hypercholesteremia   . Hyperlipemia   . Hypertension   . Palpitations   . Parkinson's disease ([place])   . Parkinson's disease ([place])   . PVD (peripheral vascular disease) ([place])   . Tremors of nervous system     Past Surgical History:  Procedure Laterality Date  . BACK SURGERY     Kyphoplasty   March 2018  . COLONOSCOPY    . ESOPHAGOGASTRODUODENOSCOPY (EGD) WITH PROPOFOL N/A 09/21/2016   Procedure: ESOPHAGOGASTRODUODENOSCOPY (EGD) WITH PROPOFOL;  Surgeon: Jonathon Bellows, MD;  Location: [facility] ENDOSCOPY;  Service: Gastroenterology;  Laterality: N/A;  . ESOPHAGOGASTRODUODENOSCOPY (EGD) WITH PROPOFOL N/A 11/07/2016   Procedure: ESOPHAGOGASTRODUODENOSCOPY (EGD) WITH PROPOFOL;  Surgeon: Jonathon Bellows, MD;  Location: [facility] ENDOSCOPY;  Service: Gastroenterology;  Laterality: N/A;  . HERNIA REPAIR     left inguinal hernia repair as an infant  . KYPHOPLASTY N/A 02/11/2016   Procedure: KYPHOPLASTY  L2,T9;  Surgeon: Hessie Knows, MD;  Location: [facility] ORS;  Service: Orthopedics;  Laterality: N/A;  . KYPHOPLASTY N/A 03/15/2016   Procedure: KYPHOPLASTY L3;  Surgeon: Hessie Knows, MD;  Location: [facility] ORS;  Service: Orthopedics;  Laterality: N/A;  . skin cancer removed    . TONSILLECTOMY      Prior to Admission medications   Medication Sig Start Date End Date Taking? Authorizing Provider  carbidopa-levodopa (SINEMET CR) 50-200 MG tablet Take 1 tablet by mouth at bedtime.   Yes  [provider]  carbidopa-levodopa (SINEMET IR) 25-100 MG tablet TK 1 T PO four times a day 08/16/16  Yes [provider]  lisinopril (PRINIVIL,ZESTRIL) 20 MG tablet Take 20 mg by mouth daily.    Yes [provider]  omeprazole (PRILOSEC) 20 MG capsule Take 20 mg by mouth daily.  10/01/16  Yes [provider]  potassium chloride 20 MEQ/15ML (10%) SOLN Take 20 mEq by mouth daily. 12/04/17  Yes [provider]  sertraline (ZOLOFT) 50 MG tablet Take 1 tablet (50 mg total) by mouth daily. 08/08/16  Yes Rainey Pines, MD  traMADol (ULTRAM) 50 MG tablet Take 1 tablet (50 mg total) by mouth every 6 (six) hours as needed. 01/06/17 01/06/18 Yes Lavonia Drafts, MD  aspirin EC 81 MG tablet Take 81 mg by mouth daily.    [provider]  baclofen (LIORESAL) 10 MG tablet Take 10 mg by mouth daily.  10/03/16   [provider]  cyclobenzaprine (FLEXERIL) 10 MG tablet Take by mouth. 05/18/16   [provider]  gabapentin (NEURONTIN) 300 MG capsule TAKE 1 CAPSULE(300 MG) BY MOUTH THREE TIMES DAILY 08/02/16   [provider]  hydrochlorothiazide (HYDRODIURIL) 25 MG tablet Take by mouth.    [provider]  pantoprazole (PROTONIX) 40 MG tablet Take 1 tablet (40 mg total) by mouth 2 (two) times daily. 09/22/16   Max Sane, MD  potassium chloride SA (  K-DUR,KLOR-CON) 20 MEQ tablet Take 20 mEq by mouth 2 (two) times daily. 06/29/16   [provider]  simvastatin (ZOCOR) 20 MG tablet Take 20 mg by mouth at bedtime. Reported on 04/14/2015    [provider]  sucralfate (CARAFATE) 1 g tablet Take 1 tablet (1 g total) by mouth 4 (four) times daily. Patient not taking: Reported on 12/09/2017 09/22/16   Max Sane, MD    Allergies as of 12/09/2017 - Review Complete 12/09/2017  Allergen Reaction Noted  . Amantadine Other (See Comments) 02/05/2014    Family History  Problem Relation Age of Onset  . Heart Problems Mother   .  Dementia Mother     Social History   Socioeconomic History  . Marital status: Married    Spouse name: Not on file  . Number of children: Not on file  . Years of education: Not on file  . Highest education level: Not on file  Occupational History  . Not on file  Social Needs  . Financial resource strain: Not on file  . Food insecurity:    Worry: Not on file    Inability: Not on file  . Transportation needs:    Medical: Not on file    Non-medical: Not on file  Tobacco Use  . Smoking status: Never Smoker  . Smokeless tobacco: Never Used  Substance and Sexual Activity  . Alcohol use: Yes    Alcohol/week: 2.0 standard drinks    Types: 2 Cans of beer per week    Comment: Daily 2 beers and shot of whiskey  . Drug use: No  . Sexual activity: Yes  Lifestyle  . Physical activity:    Days per week: Not on file    Minutes per session: Not on file  . Stress: Not on file  Relationships  . Social connections:    Talks on phone: Not on file    Gets together: Not on file    Attends religious service: Not on file    Active member of club or organization: Not on file    Attends meetings of clubs or organizations: Not on file    Relationship status: Not on file  . Intimate partner violence:    Fear of current or ex partner: Not on file    Emotionally abused: Not on file    Physically abused: Not on file    Forced sexual activity: Not on file  Other Topics Concern  . Not on file  Social History Narrative  . Not on file    Review of Systems: See HPI, otherwise negative ROS  Physical Exam: BP (!) 137/91   Pulse (!) 57   Temp (!) 97.3 F (36.3 C) (Tympanic)   Resp 20   Ht 5' (1.524 m)   Wt 61.2 kg   SpO2 98%   BMI 26.35 kg/m  General:   Alert,  pleasant and cooperative in NAD Head:  Normocephalic and atraumatic. Neck:  Supple; no masses or thyromegaly. Lungs:  Clear throughout to auscultation, normal respiratory effort.    Heart:  +S1, +S2, Regular rate and rhythm, No  edema. Abdomen:  Soft, nontender and nondistended. Normal bowel sounds, without guarding, and without rebound.   Neurologic:  Alert and  oriented x4;  grossly normal neurologically.  Impression/Plan: Charles Stanton is here for an endoscopy  to be performed for  evaluation of melena    Risks, benefits, limitations, and alternatives regarding endoscopy have been reviewed with the patient.  Questions have  been answered.  All parties agreeable.   Jonathon Bellows, MD  12/11/2017, 10:52 AM

## 2017-12-11 NOTE — Anesthesia Preprocedure Evaluation (Signed)
Anesthesia Evaluation  Patient identified by MRN, date of birth, ID band Patient awake    Reviewed: Allergy & Precautions, H&P , NPO status , Patient's Chart, lab work & pertinent test results  History of Anesthesia Complications Negative for: history of anesthetic complications  Airway Mallampati: III  TM Distance: <3 FB Neck ROM: limited    Dental  (+) Chipped, Poor Dentition, Missing, Lower Dentures, Partial Upper   Pulmonary neg pulmonary ROS, neg shortness of breath,           Cardiovascular Exercise Tolerance: Good hypertension, (-) angina+ Peripheral Vascular Disease  (-) Past MI and (-) PND + dysrhythmias Atrial Fibrillation      Neuro/Psych PSYCHIATRIC DISORDERS negative neurological ROS     GI/Hepatic Neg liver ROS, PUD, GERD  ,  Endo/Other  diabetes, Type 2  Renal/GU negative Renal ROS  negative genitourinary   Musculoskeletal   Abdominal   Peds  Hematology negative hematology ROS (+)   Anesthesia Other Findings Past Medical History: No date: Abnormal liver function No date: Anemia No date: Anxiety No date: Atrial fibrillation ([facility]) No date: Colonic mass No date: Depression No date: Dizziness No date: Dry cough No date: History of colon polyps No date: Hypercholesteremia No date: Hyperlipemia No date: Hypertension No date: Palpitations No date: Parkinson's disease ([place]) No date: Parkinson's disease ([place]) No date: PVD (peripheral vascular disease) ([place]) No date: Tremors of nervous system  Past Surgical History: No date: BACK SURGERY     Comment:  Kyphoplasty   March 2018 No date: COLONOSCOPY 09/21/2016: ESOPHAGOGASTRODUODENOSCOPY (EGD) WITH PROPOFOL; N/A     Comment:  Procedure: ESOPHAGOGASTRODUODENOSCOPY (EGD) WITH               PROPOFOL;  Surgeon: Jonathon Bellows, MD;  Location: [facility]               ENDOSCOPY;  Service: Gastroenterology;  Laterality: N/A; 11/07/2016: ESOPHAGOGASTRODUODENOSCOPY  (EGD) WITH PROPOFOL; N/A     Comment:  Procedure: ESOPHAGOGASTRODUODENOSCOPY (EGD) WITH               PROPOFOL;  Surgeon: Jonathon Bellows, MD;  Location: [facility]               ENDOSCOPY;  Service: Gastroenterology;  Laterality: N/A; No date: HERNIA REPAIR     Comment:  left inguinal hernia repair as an infant 02/11/2016: KYPHOPLASTY; N/A     Comment:  Procedure: KYPHOPLASTY  L2,T9;  Surgeon: Hessie Knows,               MD;  Location: [facility] ORS;  Service: Orthopedics;                Laterality: N/A; 03/15/2016: KYPHOPLASTY; N/A     Comment:  Procedure: KYPHOPLASTY L3;  Surgeon: Hessie Knows, MD;                Location: [facility] ORS;  Service: Orthopedics;  Laterality:               N/A; No date: skin cancer removed No date: TONSILLECTOMY  BMI    Body Mass Index:  26.35 kg/m      Reproductive/Obstetrics negative OB ROS                             Anesthesia Physical Anesthesia Plan  ASA: III  Anesthesia Plan: General   Post-op Pain Management:    Induction: Intravenous  PONV Risk Score and Plan: Propofol infusion and  TIVA  Airway Management Planned: Natural Airway and Nasal Cannula  Additional Equipment:   Intra-op Plan:   Post-operative Plan:   Informed Consent: I have reviewed the patients History and Physical, chart, labs and discussed the procedure including the risks, benefits and alternatives for the proposed anesthesia with the patient or authorized representative who has indicated his/her understanding and acceptance.   Dental Advisory Given  Plan Discussed with: Anesthesiologist, CRNA and Surgeon  Anesthesia Plan Comments: (Patient consented for risks of anesthesia including but not limited to:  - adverse reactions to medications - risk of intubation if required - damage to teeth, lips or other oral mucosa - sore throat or hoarseness - Damage to heart, brain, lungs or loss of life  Patient voiced understanding.)        Anesthesia  Quick Evaluation

## 2017-12-11 NOTE — Anesthesia Post-op Follow-up Note (Signed)
Anesthesia QCDR form completed.        

## 2017-12-11 NOTE — Progress Notes (Signed)
Initial Nutrition Assessment  DOCUMENTATION CODES:   Not applicable  INTERVENTION:  Monitor for diet advancement and results from colonoscopy   NUTRITION DIAGNOSIS:   Predicted suboptimal nutrient intake related to altered GI function(GI bleed) as evidenced by other (comment)(NPO for scheduled colonscopy s/p EDG).   GOAL:   Patient will meet greater than or equal to 90% of their needs   MONITOR:   Diet advancement, Labs  REASON FOR ASSESSMENT:   Malnutrition Screening Tool    ASSESSMENT:  62 year old male with known history of GI bleeding abnormal liver function, colonic mass, HLD, HTN, Parkinson's, Kyphoplasty L2/L3 presented to ED having black stools x 1 day and tachycardic    Patient out room for procedure at time of visit. MD in with patient at second attempt. Will need NFPE at follow up.   Per MD note, patient will go for colonoscopy tomorrow after EGD reveals mild gastric congestion.    NUTRITION - FOCUSED PHYSICAL EXAM:    Most Recent Value  Orbital Region  Unable to assess  Upper Arm Region  Unable to assess  Thoracic and Lumbar Region  Unable to assess  Buccal Region  Unable to assess  Temple Region  Unable to assess  Clavicle Bone Region  Unable to assess  Clavicle and Acromion Bone Region  Unable to assess  Scapular Bone Region  Unable to assess  Dorsal Hand  Unable to assess  Patellar Region  Unable to assess  Anterior Thigh Region  Unable to assess  Posterior Calf Region  Unable to assess  Edema (RD Assessment)  None  Hair  Unable to assess  Eyes  Unable to assess  Mouth  Unable to assess  Skin  Unable to assess  Nails  Unable to assess       Diet Order:   Diet Order            Diet NPO time specified  Diet effective midnight        Diet clear liquid Room service appropriate? Yes; Fluid consistency: Thin  Diet effective now              EDUCATION NEEDS:   Not appropriate for education at this time  Skin:  Skin Assessment:  Reviewed RN Assessment  Last BM:  12/11/17; Type 7 black; small  Height:   Ht Readings from Last 1 Encounters:  12/09/17 5' (1.524 m)    Weight:   Wt Readings from Last 1 Encounters:  12/09/17 61.2 kg    Ideal Body Weight:  48.2 kg  BMI:  Body mass index is 26.35 kg/m.  Estimated Nutritional Needs:   Kcal:  1700-1900 (MSJ 1.3-1.5)  Protein:  80-92 g/day  Fluid:  1.7-1.9L/day    Lajuan Lines, RD, LDN  After Hours/Weekend Pager: [phone]

## 2017-12-11 NOTE — Progress Notes (Signed)
Patient is becoming anxious thinking about drinking colonoscopy prep and wife requesting something like valium for the patient because he received it yesterday and it helped

## 2017-12-11 NOTE — Transfer of Care (Signed)
Immediate Anesthesia Transfer of Care Note  Patient: Charles Stanton  Procedure(s) Performed: ESOPHAGOGASTRODUODENOSCOPY (EGD) (N/A )  Patient Location: Endoscopy Unit  Anesthesia Type:General  Level of Consciousness: awake and alert   Airway & Oxygen Therapy: Patient Spontanous Breathing and Patient connected to nasal cannula oxygen  Post-op Assessment: Report given to RN and Post -op Vital signs reviewed and stable  Post vital signs: Reviewed  Last Vitals:  Vitals Value Taken Time  BP 96/58 12/11/2017 11:18 AM  Temp 36.7 C 12/11/2017 11:18 AM  Pulse 57 12/11/2017 11:19 AM  Resp 22 12/11/2017 11:19 AM  SpO2 96 % 12/11/2017 11:19 AM  Vitals shown include unvalidated device data.  Last Pain:  Vitals:   12/11/17 1116  TempSrc: Tympanic  PainSc: 0-No pain         Complications: No apparent anesthesia complications

## 2017-12-11 NOTE — Progress Notes (Signed)
[place] [facility] [place] NAME: Charles Stanton    MR#:  [id number]  DATE OF BIRTH:  October 01, 1955  SUBJECTIVE:  Got back from EGD. Wife in the room pt not too excited to get a colonoscopy. REVIEW OF SYSTEMS:   Review of Systems  Constitutional: Negative for chills, fever and weight loss.  HENT: Negative for ear discharge, ear pain and nosebleeds.   Eyes: Negative for blurred vision, pain and discharge.  Respiratory: Negative for sputum production, shortness of breath, wheezing and stridor.   Cardiovascular: Negative for chest pain, palpitations, orthopnea and PND.  Gastrointestinal: Negative for abdominal pain, diarrhea, nausea and vomiting.  Genitourinary: Negative for frequency and urgency.  Musculoskeletal: Negative for back pain and joint pain.  Neurological: Positive for tremors and weakness. Negative for sensory change, speech change and focal weakness.  Psychiatric/Behavioral: Negative for depression and hallucinations. The patient is nervous/anxious.    Tolerating Diet:yes Tolerating PT: not needed  DRUG ALLERGIES:   Allergies  Allergen Reactions  . Amantadine Other (See Comments)    fatigue sleepy Other reaction(s): Other (See Comments) fatigue sleepy fatigue sleepy fatigue sleepy    VITALS:  Blood pressure 129/76, pulse (!) 57, temperature 98.8 F (37.1 C), resp. rate 18, height 5' (1.524 m), weight 61.2 kg, SpO2 100 %.  PHYSICAL EXAMINATION:   Physical Exam  GENERAL:  62 y.o.-year-old patient lying in the bed with no acute distress.  EYES: Pupils equal, round, reactive to light and accommodation. No scleral icterus. Extraocular muscles intact.  HEENT: Head atraumatic, normocephalic. Oropharynx and nasopharynx clear.  NECK:  Supple, no jugular venous distention. No thyroid enlargement, no tenderness.  LUNGS: Normal breath sounds bilaterally, no wheezing, rales, rhonchi. No use of accessory muscles of respiration.   CARDIOVASCULAR: S1, S2 normal. No murmurs, rubs, or gallops.  ABDOMEN: Soft, nontender, nondistended. Bowel sounds present. No organomegaly or mass.  EXTREMITIES: No cyanosis, clubbing or edema b/l.    NEUROLOGIC: Cranial nerves II through XII are intact. No focal Motor or sensory deficits b/l.   PSYCHIATRIC:  patient is alert and oriented x 3. Anxious SKIN: No obvious rash, lesion, or ulcer.   LABORATORY PANEL:  CBC Recent Labs  Lab 12/10/17 0616  WBC 6.8  HGB 12.3*  HCT 36.3*  PLT 177    Chemistries  Recent Labs  Lab 12/09/17 1326  NA 141  K 3.7  CL 106  CO2 23  GLUCOSE 108*  BUN 24*  CREATININE 0.40*  CALCIUM 9.0  AST 53*  ALT 10  ALKPHOS 159*  BILITOT 1.1   Cardiac Enzymes Recent Labs  Lab 12/10/17 0616  TROPONINI <0.03   RADIOLOGY:  No results found. ASSESSMENT AND PLAN:  Charles Stanton  is a 62 y.o. male with a known history per below which also includes history of upper GI bleeding, presented emergency room with black stools since yesterday, in the emergency room patient was found to have black stool on presentation, hemoglobin stable at 13.5, noted tachycardia-heart rate up to the 150s intermittently, EKG noted for atrial flutter  *Acute a flutter with RVR Noted history of A. fib not on oral anticoagulation due to GI bleeding/PUD, not on AV nodal blocking agents chronically - Lopressor 5 mg IV x1 now, start Lopressor twice daily, supplemental oxygen PRN,  - supplemental oxygen with weaning as tolerated PRN - cardiology input noted -Noted echocardiogram from January 2019-ejection fraction 60% with mildly dilated right ventricle   *Acute melena with history of upper  GI bleeding -received Protonix drip, clear liquids for now, n.p.o. after midnight, avoid, oral PPI antiplatelet/anticoagulant/NSAID medications -transfuse as needed --EGD mild gastric congestion. Colonoscopy tomorrow -pt NOT taking PPI at home -hgb12.3  *Chronic Parkinson's -continue  Sinemet  *Chronic benign essential hypertension Stable Hold lisinopril/hydrochlorothiazide, start beta-blocker therapy as stated above, vitals per routine, make changes as per necessary   *Chronic hyperlipidemia, unspecified Continue statin therapy  *Chronic pain syndrome Stable Continue tramadol, Flexeril, Neurontin  * GAD Continue Zoloft  Case discussed with Care Management/Social Worker. Management plans discussed with the patient, family and they are in agreement.  CODE STATUS:full  DVT Prophylaxis: SCD  TOTAL TIME TAKING CARE OF THIS PATIENT: *25* minutes.  >50% time spent on counselling and coordination of care  POSSIBLE D/C IN *1-2DAYS, DEPENDING ON CLINICAL CONDITION.  Note: This dictation was prepared with Dragon dictation along with smaller phrase technology. Any transcriptional errors that result from this process are unintentional.  Fritzi Mandes M.D on 12/11/2017 at 1:55 PM  Between 7am to 6pm - Pager - [phone]  After 6pm go to [email or web address] - password [email or web address] [facility]  Office  [phone]  CC: Primary care physician; [name], MDPatient ID: Charles Stanton, male   DOB: [birth year], 62 y.o.   MRN: [id number]

## 2017-12-12 ENCOUNTER — Encounter: Payer: Self-pay | Admitting: Anesthesiology

## 2017-12-12 ENCOUNTER — Encounter
Admission: EM | Disposition: A | Discharge status: Home / Self Care | Payer: Self-pay | Source: Home / Self Care | Attending: Internal Medicine

## 2017-12-12 ENCOUNTER — Inpatient Hospital Stay: Payer: Medicare Other | Admitting: Anesthesiology

## 2017-12-12 DIAGNOSIS — K621 Rectal polyp: Secondary | ICD-10-CM

## 2017-12-12 HISTORY — PX: COLONOSCOPY WITH PROPOFOL: SHX5780

## 2017-12-12 LAB — HEPATITIS PANEL, ACUTE
HCV Ab: <0.1 s/co ratio (ref 0.0–0.9)
HEP A IGM: NEGATIVE
Hep B C IgM: NEGATIVE
Hepatitis B Surface Ag: NEGATIVE

## 2017-12-12 LAB — SURGICAL PATHOLOGY

## 2017-12-12 SURGERY — COLONOSCOPY WITH PROPOFOL
Anesthesia: General

## 2017-12-12 MED ORDER — LIDOCAINE HCL (CARDIAC) PF 100 MG/5ML IV SOSY
PREFILLED_SYRINGE | INTRAVENOUS | Status: DC | PRN
  Administered 2017-12-12: 50 mg via INTRAVENOUS

## 2017-12-12 MED ORDER — FOLIC ACID 1 MG PO TABS: 1.0000 mg | ORAL_TABLET | Freq: Every day | ORAL | 1 refills | Status: DC

## 2017-12-12 MED ORDER — PANTOPRAZOLE SODIUM 40 MG PO TBEC: 40.0000 mg | DELAYED_RELEASE_TABLET | Freq: Two times a day (BID) | ORAL | 1 refills | Status: AC

## 2017-12-12 MED ORDER — PROPOFOL 500 MG/50ML IV EMUL
INTRAVENOUS | Status: AC
  Filled 2017-12-12: qty 50

## 2017-12-12 MED ORDER — EPHEDRINE SULFATE 50 MG/ML IJ SOLN
INTRAMUSCULAR | Status: DC | PRN
  Administered 2017-12-12: 5 mg via INTRAVENOUS

## 2017-12-12 MED ORDER — 0.9 % SODIUM CHLORIDE (POUR BTL) OPTIME
TOPICAL | Status: DC | PRN
  Administered 2017-12-12: 10 mL

## 2017-12-12 MED ORDER — METOPROLOL TARTRATE 25 MG PO TABS: 50.0000 mg | ORAL_TABLET | Freq: Two times a day (BID) | ORAL | 0 refills | Status: DC

## 2017-12-12 MED ORDER — PROPOFOL 10 MG/ML IV BOLUS
INTRAVENOUS | Status: DC | PRN
  Administered 2017-12-12: 60 mg via INTRAVENOUS
  Administered 2017-12-12: 20 mg via INTRAVENOUS

## 2017-12-12 MED ORDER — PROPOFOL 500 MG/50ML IV EMUL
INTRAVENOUS | Status: DC | PRN
  Administered 2017-12-12: 130 ug/kg/min via INTRAVENOUS

## 2017-12-12 NOTE — Discharge Summary (Signed)
[place] [facility] [place] NAME: Charles Stanton    MR#:  [id number]  DATE OF BIRTH:  [birth year]  DATE OF ADMISSION:  12/09/2017 ADMITTING PHYSICIAN: Gorden Harms, MD  DATE OF DISCHARGE: 12/11/2017  PRIMARY CARE PHYSICIAN: [name], MD    ADMISSION DIAGNOSIS:  Melena [K92.1] A-fib ([place]) [I48.91]  DISCHARGE DIAGNOSIS:  Melena likely due to Acute Gastritis  SECONDARY DIAGNOSIS:   Past Medical History:  Diagnosis Date  . Abnormal liver function   . Anemia   . Anxiety   . Atrial fibrillation ([place])   . Colonic mass   . Depression   . Dizziness   . Dry cough   . History of colon polyps   . Hypercholesteremia   . Hyperlipemia   . Hypertension   . Palpitations   . Parkinson's disease ([place])   . Parkinson's disease ([place])   . PVD (peripheral vascular disease) ([place])   . Tremors of nervous system     HOSPITAL COURSE:  JeffreyRayis a62 y.o.malewith a known history per below which also includes history of upper GI bleeding, presented emergency room with black stools since yesterday, in the emergency room patient was found to have black stool on presentation, hemoglobin stable at 13.5, noted tachycardia-heart rate up to the 150s intermittently, EKG noted for atrial flutter  *Acute a flutter with RVR Noted history of A. fib not on oral anticoagulation due to GI bleeding/PUD, not on AV nodal blocking agents chronically - Lopressor 5 mg IV x1 now, start Lopressor twice daily, supplemental oxygen PRN,  - supplemental oxygen with weaning as tolerated PRN - cardiology input noted -Noted echocardiogram from January 2019-ejection fraction 60% with mildly dilated right ventricle -metoprolol 25 mg bid  *Acute melena with history of upper GI bleeding -received Protonix drip, clear liquids for now, n.p.o. after midnight, avoid, oral PPI antiplatelet/anticoagulant/NSAID medications -transfuse as needed --EGD mild  gastric/esophageal congestion.  -Colonoscopy Diverticulosis in the entire examined colon. - One 3 mm polyp in the rectum, removed with a cold  biopsy forceps. Resected and retrieve  - One 12 mm polyp in the rectum, removed with mucosal resection. Resected and retrieved. Clip was placed.  - Mucosal resection was performed. -pt NOT taking PPI at home--now on PPI bid -hgb12.3  *Chronic Parkinson's -continue Sinemet  *Chronic benign essential hypertension -D/cHCTZ - start beta-blocker therapy as stated above -cont lisinopril  *Chronic hyperlipidemia, unspecified -Continue statin therapy  *Chronic pain syndrome Continue tramadol, Flexeril, Neurontin  *GAD Continue Zoloft  D/c home  CONSULTS OBTAINED:  Treatment Team:  Isaias Cowman, MD Lin Landsman, MD  DRUG ALLERGIES:   Allergies  Allergen Reactions  . Amantadine Other (See Comments)    fatigue sleepy Other reaction(s): Other (See Comments) fatigue sleepy fatigue sleepy fatigue sleepy    DISCHARGE MEDICATIONS:   Allergies as of 12/12/2017      Reactions   Amantadine Other (See Comments)   fatigue sleepy Other reaction(s): Other (See Comments) fatigue sleepy fatigue sleepy fatigue sleepy      Medication List    STOP taking these medications   hydrochlorothiazide 25 MG tablet Commonly known as:  HYDRODIURIL   omeprazole 20 MG capsule Commonly known as:  PRILOSEC   potassium chloride SA 20 MEQ tablet Commonly known as:  K-DUR,KLOR-CON   sucralfate 1 g tablet Commonly known as:  CARAFATE     TAKE these medications   aspirin EC 81 MG tablet Take 81 mg by mouth daily.  baclofen 10 MG tablet Commonly known as:  LIORESAL Take 10 mg by mouth daily.   carbidopa-levodopa 50-200 MG tablet Commonly known as:  SINEMET CR Take 1 tablet by mouth at bedtime.   carbidopa-levodopa 25-100 MG tablet Commonly known as:  SINEMET IR TK 1 T PO four times a day   cyclobenzaprine 10 MG  tablet Commonly known as:  FLEXERIL Take by mouth.   folic acid 1 MG tablet Commonly known as:  FOLVITE Take 1 tablet (1 mg total) by mouth daily. Start taking on:  12/13/2017   gabapentin 300 MG capsule Commonly known as:  NEURONTIN TAKE 1 CAPSULE(300 MG) BY MOUTH THREE TIMES DAILY   lisinopril 20 MG tablet Commonly known as:  PRINIVIL,ZESTRIL Take 20 mg by mouth daily.   metoprolol tartrate 25 MG tablet Commonly known as:  LOPRESSOR Take 2 tablets (50 mg total) by mouth 2 (two) times daily.   pantoprazole 40 MG tablet Commonly known as:  PROTONIX Take 1 tablet (40 mg total) by mouth 2 (two) times daily.   potassium chloride 20 MEQ/15ML (10%) Soln Take 20 mEq by mouth daily.   sertraline 50 MG tablet Commonly known as:  ZOLOFT Take 1 tablet (50 mg total) by mouth daily.   simvastatin 20 MG tablet Commonly known as:  ZOCOR Take 20 mg by mouth at bedtime. Reported on 04/14/2015   traMADol 50 MG tablet Commonly known as:  ULTRAM Take 1 tablet (50 mg total) by mouth every 6 (six) hours as needed.       If you experience worsening of your admission symptoms, develop shortness of breath, life threatening emergency, suicidal or homicidal thoughts you must seek medical attention immediately by calling 911 or calling your MD immediately  if symptoms less severe.  You Must read complete instructions/literature along with all the possible adverse reactions/side effects for all the Medicines you take and that have been prescribed to you. Take any new Medicines after you have completely understood and accept all the possible adverse reactions/side effects.   Please note  You were cared for by a hospitalist during your hospital stay. If you have any questions about your discharge medications or the care you received while you were in the hospital after you are discharged, you can call the unit and asked to speak with the hospitalist on call if the hospitalist that took care of you  is not available. Once you are discharged, your primary care physician will handle any further medical issues. Please note that NO REFILLS for any discharge medications will be authorized once you are discharged, as it is imperative that you return to your primary care physician (or establish a relationship with a primary care physician if you do not have one) for your aftercare needs so that they can reassess your need for medications and monitor your lab values. Today   SUBJECTIVE   Doing well after colonscopy  VITAL SIGNS:  Blood pressure 119/73, pulse (!) 52, temperature 99.2 F (37.3 C), temperature source Oral, resp. rate 20, height 5' (1.524 m), weight 61.2 kg, SpO2 100 %.  I/O:    Intake/Output Summary (Last 24 hours) at 12/12/2017 1354 Last data filed at 12/11/2017 1851 Gross per 24 hour  Intake 509.53 ml  Output 350 ml  Net 159.53 ml    PHYSICAL EXAMINATION:  GENERAL:  62 y.o.-year-old patient lying in the bed with no acute distress.  EYES: Pupils equal, round, reactive to light and accommodation. No scleral icterus. Extraocular muscles intact.  HEENT: Head atraumatic, normocephalic. Oropharynx and nasopharynx clear.  NECK:  Supple, no jugular venous distention. No thyroid enlargement, no tenderness.  LUNGS: Normal breath sounds bilaterally, no wheezing, rales,rhonchi or crepitation. No use of accessory muscles of respiration.  CARDIOVASCULAR: S1, S2 normal. No murmurs, rubs, or gallops.  ABDOMEN: Soft, non-tender, non-distended. Bowel sounds present. No organomegaly or mass.  EXTREMITIES: No pedal edema, cyanosis, or clubbing.  NEUROLOGIC: Cranial nerves II through XII are intact. Muscle strength 4/5 in all extremities. Sensation intact. Gait not checked. Rest tremors PSYCHIATRIC: The patient is alert and oriented x 3.  SKIN: No obvious rash, lesion, or ulcer.   DATA REVIEW:   CBC  Recent Labs  Lab 12/10/17 0616  WBC 6.8  HGB 12.3*  HCT 36.3*  PLT 177     Chemistries  Recent Labs  Lab 12/09/17 1326  NA 141  K 3.7  CL 106  CO2 23  GLUCOSE 108*  BUN 24*  CREATININE 0.40*  CALCIUM 9.0  AST 53*  ALT 10  ALKPHOS 159*  BILITOT 1.1    Microbiology Results   Recent Results (from the past 240 hour(s))  MRSA PCR Screening     Status: None   Collection Time: 12/10/17 12:14 AM  Result Value Ref Range Status   MRSA by PCR NEGATIVE NEGATIVE Final    Comment:        The GeneXpert MRSA Assay (FDA approved for NASAL specimens only), is one component of a comprehensive MRSA colonization surveillance program. It is not intended to diagnose MRSA infection nor to guide or monitor treatment for MRSA infections. Performed at [facility], [address] [place], [place] [address]     RADIOLOGY:  No results found.   Management plans discussed with the patient, family and they are in agreement.  CODE STATUS:     Code Status Orders  (From admission, onward)         Start     Ordered   12/09/17 1728  Full code  Continuous     12/09/17 1727        Code Status History    Date Active Date Inactive Code Status Order ID Comments User Context   09/20/2016 0944 09/22/2016 1726 Full Code [id number]  Hillary Bow, MD ED   03/15/2016 1409 03/15/2016 1748 Full Code [id number]  Hessie Knows, MD Inpatient   01/28/2016 0123 01/31/2016 1619 Full Code [id number]  Lance Coon, MD Inpatient      TOTAL TIME TAKING CARE OF THIS PATIENT: **40 minutes.    Fritzi Mandes M.D on 12/12/2017 at 1:54 PM  Between 7am to 6pm - Pager - [phone] After 6pm go to [email or web address] - password [email or web address]  Office  [phone]  CC: Primary care physician; [name], MD

## 2017-12-12 NOTE — Anesthesia Post-op Follow-up Note (Signed)
Anesthesia QCDR form completed.        

## 2017-12-12 NOTE — Transfer of Care (Signed)
Immediate Anesthesia Transfer of Care Note  Patient: Charles Stanton  Procedure(s) Performed: COLONOSCOPY WITH PROPOFOL (N/A )  Patient Location: PACU  Anesthesia Type:General  Level of Consciousness: awake, alert  and responds to stimulation  Airway & Oxygen Therapy: Patient Spontanous Breathing and Patient connected to nasal cannula oxygen  Post-op Assessment: Report given to RN and Post -op Vital signs reviewed and stable  Post vital signs: Reviewed and stable  Last Vitals:  Vitals Value Taken Time  BP 92/70 12/12/2017 10:56 AM  Temp 36.3 C 12/12/2017 10:50 AM  Pulse 55 12/12/2017 10:56 AM  Resp 22 12/12/2017 10:56 AM  SpO2 100 % 12/12/2017 10:56 AM    Last Pain:  Vitals:   12/12/17 1050  TempSrc: Tympanic  PainSc:          Complications: No apparent anesthesia complications

## 2017-12-12 NOTE — Op Note (Signed)
[facility] Gastroenterology Patient Name: Charles Stanton Procedure Date: 12/12/2017 9:21 AM MRN: [id number] Account #: [id number] Date of Birth: Sep 06, 1955 Admit Type: Inpatient Age: 62 Room: [facility] ENDO ROOM 1 Gender: Male Note Status: Finalized Procedure:            Colonoscopy Indications:          Melena Providers:            Jonathon Bellows MD, MD Referring MD:         [name], MD (Referring MD) Medicines:            Monitored Anesthesia Care Complications:        No immediate complications. Procedure:            Pre-Anesthesia Assessment:                       - Prior to the procedure, a History and Physical was                        performed, and patient medications, allergies and                        sensitivities were reviewed. The patient's tolerance of                        previous anesthesia was reviewed.                       - The risks and benefits of the procedure and the                        sedation options and risks were discussed with the                        patient. All questions were answered and informed                        consent was obtained.                       - ASA Grade Assessment: III - A patient with severe                        systemic disease.                       After obtaining informed consent, the colonoscope was                        passed under direct vision. Throughout the procedure,                        the patient's blood pressure, pulse, and oxygen                        saturations were monitored continuously. The                        Colonoscope was introduced through the anus and                        advanced to the  the cecum, identified by the                        appendiceal orifice, IC valve and transillumination.                        The colonoscopy was performed with ease. The patient                        tolerated the procedure well. The quality of the bowel       preparation was fair. Findings:      The perianal and digital rectal examinations were normal.      Multiple small-mouthed diverticula were found in the entire colon.      A 3 mm polyp was found in the rectum. The polyp was sessile. The polyp       was removed with a cold biopsy forceps. Resection and retrieval were       complete.      A 12 mm polyp was found in the rectum. The polyp was sessile.       Preparations were made for mucosal resection. 10 mL of saline was       injected with adequate lift of the lesion from the muscularis propria.       Snare mucosal resection with suction (via the working channel) retrieval       was performed. A 14 mm area was resected. Resection and retrieval were       complete. There was no bleeding during and at the end of the procedure.       To prevent bleeding after mucosal resection, one hemostatic clip was       successfully placed. There was no bleeding during, or at the end, of the       procedure.      No additional abnormalities were found on retroflexion. Impression:           - Preparation of the colon was fair.                       - Diverticulosis in the entire examined colon.                       - One 3 mm polyp in the rectum, removed with a cold                        biopsy forceps. Resected and retrieved.                       - One 12 mm polyp in the rectum, removed with mucosal                        resection. Resected and retrieved. Clip was placed.                       - Mucosal resection was performed. Resection and                        retrieval were complete. Recommendation:       - Return patient to hospital ward for ongoing care.                       -  Advance diet as tolerated today.                       - Continue present medications.                       - Await pathology results.                       - Can be D.c today as he had yellow stool in the colon-                        he is not bleeding from the  GI tract for now. Possible                        a diverticular bleed                       This colonoscopy was not an adequate prep for polyp                        detection.,                       Repeat EGD in 8 weeks to screen for barrettes esophagus                       F/u in GI clinic in a week or two Procedure Code(s):    --- Professional ---                       [phone], Colonoscopy, flexible; with endoscopic mucosal                        resection                       [id number], 46, Colonoscopy, flexible; with biopsy, single                        or multiple Diagnosis Code(s):    --- Professional ---                       K62.1, Rectal polyp                       K92.1, Melena (includes Hematochezia)                       K57.30, Diverticulosis of large intestine without                        perforation or abscess without bleeding CPT copyright 2018 [employer]. All rights reserved. The codes documented in this report are preliminary and upon coder review may  be revised to meet current compliance requirements. Jonathon Bellows, MD Jonathon Bellows MD, MD 12/12/2017 10:56:44 AM This report has been signed electronically. Number of Addenda: 0 Note Initiated On: 12/12/2017 9:21 AM Scope Withdrawal Time: 0 hours 11 minutes 8 seconds  Total Procedure Duration: 0 hours 13 minutes 8 seconds       [facility]

## 2017-12-12 NOTE — Anesthesia Preprocedure Evaluation (Signed)
Anesthesia Evaluation  Patient identified by MRN, date of birth, ID band Patient awake    Reviewed: Allergy & Precautions, H&P , NPO status , Patient's Chart, lab work & pertinent test results  History of Anesthesia Complications Negative for: history of anesthetic complications  Airway Mallampati: III  TM Distance: <3 FB Neck ROM: limited    Dental  (+) Chipped, Poor Dentition, Missing, Lower Dentures, Partial Upper   Pulmonary neg pulmonary ROS, neg shortness of breath,           Cardiovascular Exercise Tolerance: Good hypertension, (-) angina+ Peripheral Vascular Disease  (-) Past MI and (-) PND + dysrhythmias Atrial Fibrillation      Neuro/Psych PSYCHIATRIC DISORDERS negative neurological ROS     GI/Hepatic Neg liver ROS, PUD, GERD  ,  Endo/Other  diabetes, Type 2  Renal/GU negative Renal ROS  negative genitourinary   Musculoskeletal   Abdominal   Peds  Hematology negative hematology ROS (+)   Anesthesia Other Findings Past Medical History: No date: Abnormal liver function No date: Anemia No date: Anxiety No date: Atrial fibrillation (HCC) No date: Colonic mass No date: Depression No date: Dizziness No date: Dry cough No date: History of colon polyps No date: Hypercholesteremia No date: Hyperlipemia No date: Hypertension No date: Palpitations No date: Parkinson's disease ([employer]) No date: Parkinson's disease ([place]) No date: PVD (peripheral vascular disease) ([place]) No date: Tremors of nervous system  Past Surgical History: No date: BACK SURGERY     Comment:  Kyphoplasty   March 2018 No date: COLONOSCOPY 09/21/2016: ESOPHAGOGASTRODUODENOSCOPY (EGD) WITH PROPOFOL; N/A     Comment:  Procedure: ESOPHAGOGASTRODUODENOSCOPY (EGD) WITH               PROPOFOL;  Surgeon: Jonathon Bellows, MD;  Location: [facility]               ENDOSCOPY;  Service: Gastroenterology;  Laterality: N/A; 11/07/2016: ESOPHAGOGASTRODUODENOSCOPY  (EGD) WITH PROPOFOL; N/A     Comment:  Procedure: ESOPHAGOGASTRODUODENOSCOPY (EGD) WITH               PROPOFOL;  Surgeon: Jonathon Bellows, MD;  Location: [facility]               ENDOSCOPY;  Service: Gastroenterology;  Laterality: N/A; No date: HERNIA REPAIR     Comment:  left inguinal hernia repair as an infant 02/11/2016: KYPHOPLASTY; N/A     Comment:  Procedure: KYPHOPLASTY  L2,T9;  Surgeon: Hessie Knows,               MD;  Location: [facility] ORS;  Service: Orthopedics;                Laterality: N/A; 03/15/2016: KYPHOPLASTY; N/A     Comment:  Procedure: KYPHOPLASTY L3;  Surgeon: Hessie Knows, MD;                Location: [facility] ORS;  Service: Orthopedics;  Laterality:               N/A; No date: skin cancer removed No date: TONSILLECTOMY  BMI    Body Mass Index:  26.35 kg/m      Reproductive/Obstetrics negative OB ROS                             Anesthesia Physical  Anesthesia Plan  ASA: III  Anesthesia Plan: General   Post-op Pain Management:    Induction: Intravenous  PONV Risk Score and Plan: Propofol infusion  and TIVA  Airway Management Planned: Natural Airway and Nasal Cannula  Additional Equipment:   Intra-op Plan:   Post-operative Plan:   Informed Consent: I have reviewed the patients History and Physical, chart, labs and discussed the procedure including the risks, benefits and alternatives for the proposed anesthesia with the patient or authorized representative who has indicated his/her understanding and acceptance.   Dental Advisory Given  Plan Discussed with: Anesthesiologist, CRNA and Surgeon  Anesthesia Plan Comments: (Patient consented for risks of anesthesia including but not limited to:  - adverse reactions to medications - risk of intubation if required - damage to teeth, lips or other oral mucosa - sore throat or hoarseness - Damage to heart, brain, lungs or loss of life  Patient voiced understanding.)        Anesthesia  Quick Evaluation

## 2017-12-12 NOTE — Anesthesia Procedure Notes (Signed)
Performed by: Deniro Laymon, CRNA Pre-anesthesia Checklist: Patient identified, Emergency Drugs available, Suction available, Patient being monitored and Timeout performed Patient Re-evaluated:Patient Re-evaluated prior to induction Oxygen Delivery Method: Nasal cannula Induction Type: IV induction       

## 2017-12-12 NOTE — Progress Notes (Signed)
Patient discharge teaching given, including activity, diet, follow-up appoints, and medications. Patient verbalized understanding of all discharge instructions. IV access was d/c'd. Vitals are stable. Skin is intact except as charted in most recent assessments. Pt to be escorted out by NT, to be driven home by family.  Marisha Renier  

## 2017-12-12 NOTE — H&P (Signed)
Jonathon Bellows, MD [address], [place], [place], [place], [address] [id number], [place], [place], [place], [address] Phone: [phone]  Fax: [phone]  Primary Care Physician:  [name], MD   Pre-Procedure History & Physical: HPI:  DEAGO BURRUSS is a 62 y.o. male is here for an colonoscopy.   Past Medical History:  Diagnosis Date  . Abnormal liver function   . Anemia   . Anxiety   . Atrial fibrillation ([place])   . Colonic mass   . Depression   . Dizziness   . Dry cough   . History of colon polyps   . Hypercholesteremia   . Hyperlipemia   . Hypertension   . Palpitations   . Parkinson's disease ([place])   . Parkinson's disease ([place])   . PVD (peripheral vascular disease) ([place])   . Tremors of nervous system     Past Surgical History:  Procedure Laterality Date  . BACK SURGERY     Kyphoplasty   March 2018  . COLONOSCOPY    . ESOPHAGOGASTRODUODENOSCOPY N/A 12/11/2017   Procedure: ESOPHAGOGASTRODUODENOSCOPY (EGD);  Surgeon: Jonathon Bellows, MD;  [facility] ENDOSCOPY;  Service: Gastroenterology;  Laterality: N/A;  . ESOPHAGOGASTRODUODENOSCOPY (EGD) WITH PROPOFOL N/A 09/21/2016   Procedure: ESOPHAGOGASTRODUODENOSCOPY (EGD) WITH PROPOFOL;  Surgeon: Jonathon Bellows, MD;  Location: [facility] ENDOSCOPY;  Service: Gastroenterology;  Laterality: N/A;  . ESOPHAGOGASTRODUODENOSCOPY (EGD) WITH PROPOFOL N/A 11/07/2016   Procedure: ESOPHAGOGASTRODUODENOSCOPY (EGD) WITH PROPOFOL;  Surgeon: Jonathon Bellows, MD;  Location: [facility] ENDOSCOPY;  Service: Gastroenterology;  Laterality: N/A;  . HERNIA REPAIR     left inguinal hernia repair as an infant  . KYPHOPLASTY N/A 02/11/2016   Procedure: KYPHOPLASTY  L2,T9;  Surgeon: Hessie Knows, MD;  Location: [facility] ORS;  Service: Orthopedics;  Laterality: N/A;  . KYPHOPLASTY N/A 03/15/2016   Procedure: KYPHOPLASTY L3;  Surgeon: Hessie Knows, MD;  Location: [facility] ORS;  Service: Orthopedics;  Laterality: N/A;  . skin cancer removed    . TONSILLECTOMY       Prior to Admission medications   Medication Sig Start Date End Date Taking? Authorizing Provider  carbidopa-levodopa (SINEMET CR) 50-200 MG tablet Take 1 tablet by mouth at bedtime.   Yes [provider]  carbidopa-levodopa (SINEMET IR) 25-100 MG tablet TK 1 T PO four times a day 08/16/16  Yes [provider]  lisinopril (PRINIVIL,ZESTRIL) 20 MG tablet Take 20 mg by mouth daily.    Yes [provider]  omeprazole (PRILOSEC) 20 MG capsule Take 20 mg by mouth daily.  10/01/16  Yes [provider]  potassium chloride 20 MEQ/15ML (10%) SOLN Take 20 mEq by mouth daily. 12/04/17  Yes [provider]  sertraline (ZOLOFT) 50 MG tablet Take 1 tablet (50 mg total) by mouth daily. 08/08/16  Yes Rainey Pines, MD  traMADol (ULTRAM) 50 MG tablet Take 1 tablet (50 mg total) by mouth every 6 (six) hours as needed. 01/06/17 01/06/18 Yes Lavonia Drafts, MD  aspirin EC 81 MG tablet Take 81 mg by mouth daily.    [provider]  baclofen (LIORESAL) 10 MG tablet Take 10 mg by mouth daily.  10/03/16   [provider]  cyclobenzaprine (FLEXERIL) 10 MG tablet Take by mouth. 05/18/16   [provider]  gabapentin (NEURONTIN) 300 MG capsule TAKE 1 CAPSULE(300 MG) BY MOUTH THREE TIMES DAILY 08/02/16   [provider]  hydrochlorothiazide (HYDRODIURIL) 25 MG tablet Take by mouth.    [provider]  pantoprazole (PROTONIX) 40  MG tablet Take 1 tablet (40 mg total) by mouth 2 (two) times daily. 09/22/16   Max Sane, MD  potassium chloride SA (K-DUR,KLOR-CON) 20 MEQ tablet Take 20 mEq by mouth 2 (two) times daily. 06/29/16   [provider]  simvastatin (ZOCOR) 20 MG tablet Take 20 mg by mouth at bedtime. Reported on 04/14/2015    [provider]  sucralfate (CARAFATE) 1 g tablet Take 1 tablet (1 g total) by mouth 4 (four) times daily. Patient not taking: Reported on 12/09/2017 09/22/16   Max Sane, MD    Allergies as of  12/09/2017 - Review Complete 12/09/2017  Allergen Reaction Noted  . Amantadine Other (See Comments) 02/05/2014    Family History  Problem Relation Age of Onset  . Heart Problems Mother   . Dementia Mother     Social History   Socioeconomic History  . Marital status: Married    Spouse name: Not on file  . Number of children: Not on file  . Years of education: Not on file  . Highest education level: Not on file  Occupational History  . Not on file  Social Needs  . Financial resource strain: Not on file  . Food insecurity:    Worry: Not on file    Inability: Not on file  . Transportation needs:    Medical: Not on file    Non-medical: Not on file  Tobacco Use  . Smoking status: Never Smoker  . Smokeless tobacco: Never Used  Substance and Sexual Activity  . Alcohol use: Yes    Alcohol/week: 2.0 standard drinks    Types: 2 Cans of beer per week    Comment: Daily 2 beers and shot of whiskey  . Drug use: No  . Sexual activity: Yes  Lifestyle  . Physical activity:    Days per week: Not on file    Minutes per session: Not on file  . Stress: Not on file  Relationships  . Social connections:    Talks on phone: Not on file    Gets together: Not on file    Attends religious service: Not on file    Active member of club or organization: Not on file    Attends meetings of clubs or organizations: Not on file    Relationship status: Not on file  . Intimate partner violence:    Fear of current or ex partner: Not on file    Emotionally abused: Not on file    Physically abused: Not on file    Forced sexual activity: Not on file  Other Topics Concern  . Not on file  Social History Narrative  . Not on file    Review of Systems: See HPI, otherwise negative ROS  Physical Exam: BP 136/69   Pulse 64   Temp 98.1 F (36.7 C) (Tympanic)   Resp 16   Ht 5' (1.524 m)   Wt 61.2 kg   SpO2 100%   BMI 26.35 kg/m  General:   Alert,  pleasant and cooperative in NAD Head:   Normocephalic and atraumatic. Neck:  Supple; no masses or thyromegaly. Lungs:  Clear throughout to auscultation, normal respiratory effort.    Heart:  +S1, +S2, Regular rate and rhythm, No edema. Abdomen:  Soft, nontender and nondistended. Normal bowel sounds, without guarding, and without rebound.   Neurologic:  Alert and  oriented x4;  grossly normal neurologically.  Impression/Plan: [name] is here for an colonoscopy to be performed for melena Risks, benefits,  limitations, and alternatives regarding  colonoscopy have been reviewed with the patient.  Questions have been answered.  All parties agreeable.   Jonathon Bellows, MD  12/12/2017, 10:25 AM

## 2017-12-12 NOTE — Anesthesia Postprocedure Evaluation (Signed)
Anesthesia Post Note  Patient: Charles Stanton  Procedure(s) Performed: COLONOSCOPY WITH PROPOFOL (N/A )  Patient location during evaluation: Endoscopy Anesthesia Type: General Level of consciousness: awake and alert Pain management: pain level controlled Vital Signs Assessment: post-procedure vital signs reviewed and stable Respiratory status: spontaneous breathing, nonlabored ventilation, respiratory function stable and patient connected to nasal cannula oxygen Cardiovascular status: blood pressure returned to baseline and stable Postop Assessment: no apparent nausea or vomiting Anesthetic complications: no     Last Vitals:  Vitals:   12/12/17 1120 12/12/17 1147  BP: 112/72 119/73  Pulse: (!) 51 (!) 52  Resp: (!) 29 20  Temp:  37.3 C  SpO2: 99% 100%    Last Pain:  Vitals:   12/12/17 1147  TempSrc: Oral  PainSc:                  Precious Haws [place]iscit[place]

## 2017-12-12 NOTE — Care Management Important Message (Signed)
Copy of signed Medicare IM left in patient's room (out for procedure).  

## 2017-12-13 ENCOUNTER — Encounter: Payer: Self-pay | Admitting: Gastroenterology

## 2017-12-13 LAB — SURGICAL PATHOLOGY

## 2017-12-15 ENCOUNTER — Other Ambulatory Visit: Payer: Self-pay

## 2017-12-15 DIAGNOSIS — K22719 Barrett's esophagus with dysplasia, unspecified: Secondary | ICD-10-CM

## 2017-12-18 ENCOUNTER — Telehealth: Payer: Self-pay

## 2017-12-18 NOTE — Telephone Encounter (Signed)
EMMI Follow-up: Noted on the report that the patient did not know who to contact if there was a change in his condition and didn't have a follow-up appointment. I called and left a voice message with my contact information for the patient to call me at his convenience. After Visit Summary shows a follow-up appointment should be made with Dr. Ginette Pitman. Will remind patient when he calls.

## 2017-12-24 ENCOUNTER — Encounter: Payer: Self-pay | Admitting: Gastroenterology

## 2018-01-23 ENCOUNTER — Other Ambulatory Visit: Payer: Self-pay

## 2018-01-23 ENCOUNTER — Emergency Department: Payer: Medicare Other

## 2018-01-23 ENCOUNTER — Inpatient Hospital Stay
Admission: EM | Admit: 2018-01-23 | Discharge: 2018-01-27 | DRG: 897 | Disposition: A | Discharge status: Skilled Nursing Facility | Payer: Medicare Other | Attending: Internal Medicine | Admitting: Internal Medicine

## 2018-01-23 DIAGNOSIS — F329 Major depressive disorder, single episode, unspecified: Secondary | ICD-10-CM | POA: Diagnosis present

## 2018-01-23 DIAGNOSIS — Z9089 Acquired absence of other organs: Secondary | ICD-10-CM

## 2018-01-23 DIAGNOSIS — I4891 Unspecified atrial fibrillation: Secondary | ICD-10-CM | POA: Diagnosis present

## 2018-01-23 DIAGNOSIS — F419 Anxiety disorder, unspecified: Secondary | ICD-10-CM | POA: Diagnosis present

## 2018-01-23 DIAGNOSIS — I739 Peripheral vascular disease, unspecified: Secondary | ICD-10-CM | POA: Diagnosis present

## 2018-01-23 DIAGNOSIS — Z818 Family history of other mental and behavioral disorders: Secondary | ICD-10-CM

## 2018-01-23 DIAGNOSIS — F10230 Alcohol dependence with withdrawal, uncomplicated: Secondary | ICD-10-CM

## 2018-01-23 DIAGNOSIS — Z7982 Long term (current) use of aspirin: Secondary | ICD-10-CM

## 2018-01-23 DIAGNOSIS — I10 Essential (primary) hypertension: Secondary | ICD-10-CM | POA: Diagnosis present

## 2018-01-23 DIAGNOSIS — R001 Bradycardia, unspecified: Secondary | ICD-10-CM | POA: Diagnosis present

## 2018-01-23 DIAGNOSIS — G312 Degeneration of nervous system due to alcohol: Secondary | ICD-10-CM | POA: Diagnosis present

## 2018-01-23 DIAGNOSIS — F10239 Alcohol dependence with withdrawal, unspecified: Secondary | ICD-10-CM | POA: Diagnosis present

## 2018-01-23 DIAGNOSIS — Z86010 Personal history of colonic polyps: Secondary | ICD-10-CM

## 2018-01-23 DIAGNOSIS — E7800 Pure hypercholesterolemia, unspecified: Secondary | ICD-10-CM | POA: Diagnosis present

## 2018-01-23 DIAGNOSIS — G20 Parkinson's disease: Secondary | ICD-10-CM | POA: Diagnosis present

## 2018-01-23 DIAGNOSIS — Z9181 History of falling: Secondary | ICD-10-CM

## 2018-01-23 DIAGNOSIS — E785 Hyperlipidemia, unspecified: Secondary | ICD-10-CM | POA: Diagnosis present

## 2018-01-23 DIAGNOSIS — I951 Orthostatic hypotension: Secondary | ICD-10-CM | POA: Diagnosis not present

## 2018-01-23 DIAGNOSIS — Z85828 Personal history of other malignant neoplasm of skin: Secondary | ICD-10-CM

## 2018-01-23 DIAGNOSIS — F10939 Alcohol use, unspecified with withdrawal, unspecified: Secondary | ICD-10-CM | POA: Diagnosis present

## 2018-01-23 DIAGNOSIS — R531 Weakness: Secondary | ICD-10-CM | POA: Diagnosis present

## 2018-01-23 DIAGNOSIS — Z888 Allergy status to other drugs, medicaments and biological substances status: Secondary | ICD-10-CM

## 2018-01-23 DIAGNOSIS — K219 Gastro-esophageal reflux disease without esophagitis: Secondary | ICD-10-CM | POA: Diagnosis present

## 2018-01-23 DIAGNOSIS — R296 Repeated falls: Secondary | ICD-10-CM | POA: Diagnosis present

## 2018-01-23 DIAGNOSIS — Z8249 Family history of ischemic heart disease and other diseases of the circulatory system: Secondary | ICD-10-CM | POA: Diagnosis not present

## 2018-01-23 DIAGNOSIS — Z79899 Other long term (current) drug therapy: Secondary | ICD-10-CM

## 2018-01-23 DIAGNOSIS — E876 Hypokalemia: Secondary | ICD-10-CM | POA: Diagnosis not present

## 2018-01-23 LAB — CBC WITH DIFFERENTIAL/PLATELET
ABS IMMATURE GRANULOCYTES: 0.1 10*3/uL — AB (ref 0.00–0.07)
BASOS PCT: 1 %
Basophils Absolute: 0 10*3/uL (ref 0.0–0.1)
Eosinophils Absolute: 0.1 10*3/uL (ref 0.0–0.5)
Eosinophils Relative: 2 %
HCT: 40.3 % (ref 39.0–52.0)
HEMOGLOBIN: 13.1 g/dL (ref 13.0–17.0)
Immature Granulocytes: 1 %
Lymphocytes Relative: 16 %
Lymphs Abs: 1.2 10*3/uL (ref 0.7–4.0)
MCH: 31.3 pg (ref 26.0–34.0)
MCHC: 32.5 g/dL (ref 30.0–36.0)
MCV: 96.2 fL (ref 80.0–100.0)
MONO ABS: 1 10*3/uL (ref 0.1–1.0)
Monocytes Relative: 14 %
Neutro Abs: 5 10*3/uL (ref 1.7–7.7)
Neutrophils Relative %: 66 %
Platelets: 149 10*3/uL — ABNORMAL LOW (ref 150–400)
RBC: 4.19 MIL/uL — ABNORMAL LOW (ref 4.22–5.81)
RDW: 14.6 % (ref 11.5–15.5)
WBC: 7.5 10*3/uL (ref 4.0–10.5)
nRBC: 0 % (ref 0.0–0.2)

## 2018-01-23 LAB — COMPREHENSIVE METABOLIC PANEL
ALBUMIN: 3.7 g/dL (ref 3.5–5.0)
ALT: 11 U/L (ref 0–44)
AST: 55 U/L — ABNORMAL HIGH (ref 15–41)
Alkaline Phosphatase: 123 U/L (ref 38–126)
Anion gap: 10 (ref 5–15)
BUN: 13 mg/dL (ref 8–23)
CO2: 26 mmol/L (ref 22–32)
Calcium: 8.9 mg/dL (ref 8.9–10.3)
Chloride: 95 mmol/L — ABNORMAL LOW (ref 98–111)
Creatinine, Ser: 0.64 mg/dL (ref 0.61–1.24)
GFR calc Af Amer: >60 mL/min (ref >60)
GFR calc non Af Amer: >60 mL/min (ref >60)
Glucose, Bld: 85 mg/dL (ref 70–99)
Potassium: 4.3 mmol/L (ref 3.5–5.1)
SODIUM: 131 mmol/L — AB (ref 135–145)
Total Bilirubin: 1.5 mg/dL — ABNORMAL HIGH (ref 0.3–1.2)
Total Protein: 6.5 g/dL (ref 6.5–8.1)

## 2018-01-23 LAB — TROPONIN I: Troponin I: <0.03 ng/mL (ref <0.03)

## 2018-01-23 LAB — FOLATE: FOLATE: 80 ng/mL (ref >5.9)

## 2018-01-23 LAB — ETHANOL: Alcohol, Ethyl (B): <10 mg/dL (ref <10)

## 2018-01-23 MED ORDER — BACLOFEN 10 MG PO TABS
10.0000 mg | ORAL_TABLET | Freq: Every day | ORAL | Status: DC
  Administered 2018-01-24 – 2018-01-27 (×4): 10 mg via ORAL
  Filled 2018-01-23 (×5): qty 1

## 2018-01-23 MED ORDER — LORAZEPAM 2 MG/ML IJ SOLN
0.0000 mg | Freq: Two times a day (BID) | INTRAMUSCULAR | Status: DC
  Administered 2018-01-25: 2 mg via INTRAVENOUS
  Filled 2018-01-23: qty 1

## 2018-01-23 MED ORDER — VITAMIN B-1 100 MG PO TABS
100.0000 mg | ORAL_TABLET | Freq: Every day | ORAL | Status: DC
  Administered 2018-01-23: 100 mg via ORAL
  Filled 2018-01-23: qty 1

## 2018-01-23 MED ORDER — SODIUM CHLORIDE 0.9 % IV BOLUS
1000.0000 mL | Freq: Once | INTRAVENOUS | Status: AC
  Administered 2018-01-23: 1000 mL via INTRAVENOUS

## 2018-01-23 MED ORDER — CARBIDOPA-LEVODOPA 25-100 MG PO TABS
1.0000 | ORAL_TABLET | Freq: Four times a day (QID) | ORAL | Status: DC
  Administered 2018-01-24 – 2018-01-27 (×13): 1 via ORAL
  Filled 2018-01-23 (×17): qty 1

## 2018-01-23 MED ORDER — LORAZEPAM 2 MG/ML IJ SOLN
2.0000 mg | INTRAMUSCULAR | Status: DC | PRN
  Administered 2018-01-23 – 2018-01-25 (×2): 2 mg via INTRAVENOUS
  Filled 2018-01-23 (×2): qty 1

## 2018-01-23 MED ORDER — LORAZEPAM 2 MG PO TABS: 2.0000 mg | ORAL_TABLET | ORAL | Status: DC | PRN

## 2018-01-23 MED ORDER — TRAZODONE HCL 50 MG PO TABS
100.0000 mg | ORAL_TABLET | Freq: Every day | ORAL | Status: DC
  Administered 2018-01-24 – 2018-01-25 (×2): 100 mg via ORAL
  Filled 2018-01-23 (×3): qty 2

## 2018-01-23 MED ORDER — LORAZEPAM 2 MG/ML IJ SOLN
1.0000 mg | Freq: Once | INTRAMUSCULAR | Status: AC
  Administered 2018-01-23: 1 mg via INTRAVENOUS
  Filled 2018-01-23: qty 1

## 2018-01-23 MED ORDER — ADULT MULTIVITAMIN W/MINERALS CH
1.0000 | ORAL_TABLET | Freq: Every day | ORAL | Status: DC
  Administered 2018-01-23 – 2018-01-27 (×5): 1 via ORAL
  Filled 2018-01-23 (×5): qty 1

## 2018-01-23 MED ORDER — SODIUM CHLORIDE 0.9 % IV SOLN
1.0000 mg | Freq: Once | INTRAVENOUS | Status: AC
  Administered 2018-01-23: 1 mg via INTRAVENOUS
  Filled 2018-01-23: qty 0.2

## 2018-01-23 MED ORDER — METOPROLOL TARTRATE 50 MG PO TABS
50.0000 mg | ORAL_TABLET | Freq: Two times a day (BID) | ORAL | Status: DC
  Filled 2018-01-23: qty 1

## 2018-01-23 MED ORDER — GABAPENTIN 100 MG PO CAPS
200.0000 mg | ORAL_CAPSULE | Freq: Two times a day (BID) | ORAL | Status: DC
  Administered 2018-01-24 – 2018-01-27 (×6): 200 mg via ORAL
  Filled 2018-01-23 (×7): qty 2

## 2018-01-23 MED ORDER — THIAMINE HCL 100 MG/ML IJ SOLN
100.0000 mg | Freq: Every day | INTRAMUSCULAR | Status: DC
  Filled 2018-01-23: qty 1

## 2018-01-23 MED ORDER — PANTOPRAZOLE SODIUM 40 MG PO TBEC
40.0000 mg | DELAYED_RELEASE_TABLET | Freq: Two times a day (BID) | ORAL | Status: DC
  Administered 2018-01-24 – 2018-01-27 (×7): 40 mg via ORAL
  Filled 2018-01-23 (×7): qty 1

## 2018-01-23 MED ORDER — FOLIC ACID 1 MG PO TABS: 1.0000 mg | ORAL_TABLET | Freq: Every day | ORAL | Status: DC

## 2018-01-23 MED ORDER — VITAMIN B-12 100 MCG PO TABS
100.0000 ug | ORAL_TABLET | Freq: Every day | ORAL | Status: DC
  Administered 2018-01-24 – 2018-01-27 (×4): 100 ug via ORAL
  Filled 2018-01-23 (×4): qty 1

## 2018-01-23 MED ORDER — THIAMINE HCL 100 MG/ML IJ SOLN
100.0000 mg | Freq: Once | INTRAMUSCULAR | Status: AC
  Administered 2018-01-23: 100 mg via INTRAVENOUS
  Filled 2018-01-23: qty 2

## 2018-01-23 MED ORDER — FOLIC ACID 1 MG PO TABS
1.0000 mg | ORAL_TABLET | Freq: Every day | ORAL | Status: DC
  Administered 2018-01-23 – 2018-01-27 (×5): 1 mg via ORAL
  Filled 2018-01-23 (×5): qty 1

## 2018-01-23 MED ORDER — DOCUSATE SODIUM 100 MG PO CAPS: 100.0000 mg | ORAL_CAPSULE | Freq: Two times a day (BID) | ORAL | Status: DC | PRN

## 2018-01-23 MED ORDER — BUPROPION HCL ER (XL) 150 MG PO TB24
150.0000 mg | ORAL_TABLET | Freq: Every day | ORAL | Status: DC
  Administered 2018-01-24 – 2018-01-27 (×4): 150 mg via ORAL
  Filled 2018-01-23 (×4): qty 1

## 2018-01-23 MED ORDER — CARBIDOPA-LEVODOPA ER 50-200 MG PO TBCR
1.0000 | EXTENDED_RELEASE_TABLET | Freq: Every day | ORAL | Status: DC
  Administered 2018-01-24 – 2018-01-26 (×3): 1 via ORAL
  Filled 2018-01-23 (×5): qty 1

## 2018-01-23 MED ORDER — LISINOPRIL 10 MG PO TABS
20.0000 mg | ORAL_TABLET | Freq: Every day | ORAL | Status: DC
  Filled 2018-01-23: qty 2

## 2018-01-23 MED ORDER — LORAZEPAM 2 MG/ML IJ SOLN
0.0000 mg | Freq: Four times a day (QID) | INTRAMUSCULAR | Status: DC
  Administered 2018-01-25: 04:00:00 2 mg via INTRAVENOUS
  Administered 2018-01-25: 17:00:00 1 mg via INTRAVENOUS
  Filled 2018-01-23 (×2): qty 1

## 2018-01-23 MED ORDER — SERTRALINE HCL 50 MG PO TABS
50.0000 mg | ORAL_TABLET | Freq: Every day | ORAL | Status: DC
  Administered 2018-01-24 – 2018-01-27 (×4): 50 mg via ORAL
  Filled 2018-01-23 (×4): qty 1

## 2018-01-23 MED ORDER — HEPARIN SODIUM (PORCINE) 5000 UNIT/ML IJ SOLN
5000.0000 [IU] | Freq: Three times a day (TID) | INTRAMUSCULAR | Status: DC
  Administered 2018-01-24 – 2018-01-27 (×11): 5000 [IU] via SUBCUTANEOUS
  Filled 2018-01-23 (×11): qty 1

## 2018-01-23 NOTE — ED Notes (Signed)
Allison RN, aware of bed assigned  

## 2018-01-23 NOTE — H&P (Signed)
[facility] [place] NAME: Charles Stanton    MR#:  [id number]  DATE OF BIRTH:  01-22-1955  DATE OF ADMISSION:  01/23/2018  PRIMARY CARE PHYSICIAN: [name], MD   REQUESTING/REFERRING PHYSICIAN: McShane  CHIEF COMPLAINT:   Chief Complaint  Patient presents with  . Weakness    HISTORY OF PRESENT ILLNESS: Charles Stanton  is a 63 y.o. male with a known history of abnormal liver function, anemia, anxiety, atrial fibrillation, depression, dizziness, hypercholesterolemia, hyperlipidemia, hypertension, Parkinson's disease, peripheral vascular disease-drinks alcohol every day.  He has some shaking because of his Parkinson's disease but normally he does not fall down.  He has some physician appointment and lab work coming up so he stopped drinking alcohol for last 2 days.  He had one fall yesterday and today when wife returned from work she found that he had a fall 2-3 times and was very weak and excessively shaking so she decided to bring him to the emergency room Initial work-up in the emergency room was negative for any infections but noted to be going through alcohol withdrawal along with his Parkinson's so ER physician did get the physical therapy evaluation where they found him significant imbalance and suggested to place in short-term rehab.  ER physician thought this is all due to his alcohol withdrawal and suggested to admit to hospital to let this condition improve in the next day or 2.  PAST MEDICAL HISTORY:   Past Medical History:  Diagnosis Date  . Abnormal liver function   . Anemia   . Anxiety   . Atrial fibrillation ([place])   . Colonic mass   . Depression   . Dizziness   . Dry cough   . History of colon polyps   . Hypercholesteremia   . Hyperlipemia   . Hypertension   . Palpitations   . Parkinson's disease ([place])   . Parkinson's disease ([place])   . PVD (peripheral vascular disease) ([place])   . Tremors of nervous system     PAST  SURGICAL HISTORY:  Past Surgical History:  Procedure Laterality Date  . BACK SURGERY     Kyphoplasty   March 2018  . COLONOSCOPY    . COLONOSCOPY WITH PROPOFOL N/A 12/12/2017   Procedure: COLONOSCOPY WITH PROPOFOL;  Surgeon: Jonathon Bellows, MD;  Location: [facility] ENDOSCOPY;  Service: Gastroenterology;  Laterality: N/A;  . ESOPHAGOGASTRODUODENOSCOPY N/A 12/11/2017   Procedure: ESOPHAGOGASTRODUODENOSCOPY (EGD);  Surgeon: Jonathon Bellows, MD;  Location: [facility] ENDOSCOPY;  Service: Gastroenterology;  Laterality: N/A;  . ESOPHAGOGASTRODUODENOSCOPY (EGD) WITH PROPOFOL N/A 09/21/2016   Procedure: ESOPHAGOGASTRODUODENOSCOPY (EGD) WITH PROPOFOL;  Surgeon: Jonathon Bellows, MD;  Location: [facility] ENDOSCOPY;  Service: Gastroenterology;  Laterality: N/A;  . ESOPHAGOGASTRODUODENOSCOPY (EGD) WITH PROPOFOL N/A 11/07/2016   Procedure: ESOPHAGOGASTRODUODENOSCOPY (EGD) WITH PROPOFOL;  Surgeon: Jonathon Bellows, MD;  Location: [facility] ENDOSCOPY;  Service: Gastroenterology;  Laterality: N/A;  . HERNIA REPAIR     left inguinal hernia repair as an infant  . KYPHOPLASTY N/A 02/11/2016   Procedure: KYPHOPLASTY  L2,T9;  Surgeon: Hessie Knows, MD;  Location: [facility] ORS;  Service: Orthopedics;  Laterality: N/A;  . KYPHOPLASTY N/A 03/15/2016   Procedure: KYPHOPLASTY L3;  Surgeon: Hessie Knows, MD;  Location: [facility] ORS;  Service: Orthopedics;  Laterality: N/A;  . skin cancer removed    . TONSILLECTOMY      SOCIAL HISTORY:  Social History   Tobacco Use  . Smoking status: Never Smoker  . Smokeless tobacco: Never Used  Substance Use Topics  .  Alcohol use: Yes    Alcohol/week: 2.0 standard drinks    Types: 2 Cans of beer per week    Comment: Daily 2 beers and shot of whiskey    FAMILY HISTORY:  Family History  Problem Relation Age of Onset  . Heart Problems Mother   . Dementia Mother     DRUG ALLERGIES:  Allergies  Allergen Reactions  . Amantadine Other (See Comments)    fatigue sleepy Other reaction(s): Other (See Comments) fatigue  sleepy fatigue sleepy fatigue sleepy    REVIEW OF SYSTEMS:   CONSTITUTIONAL: No fever, severe fatigue or weakness.  EYES: No blurred or double vision.  EARS, NOSE, AND THROAT: No tinnitus or ear pain.  RESPIRATORY: No cough, shortness of breath, wheezing or hemoptysis.  CARDIOVASCULAR: No chest pain, orthopnea, edema.  GASTROINTESTINAL: No nausea, vomiting, diarrhea or abdominal pain.  GENITOURINARY: No dysuria, hematuria.  ENDOCRINE: No polyuria, nocturia,  HEMATOLOGY: No anemia, easy bruising or bleeding SKIN: No rash or lesion. MUSCULOSKELETAL: No joint pain or arthritis.   NEUROLOGIC: No tingling, numbness, weakness.  Have shaking and imbalance. PSYCHIATRY: No anxiety or depression.   MEDICATIONS AT HOME:  Prior to Admission medications   Medication Sig Start Date End Date Taking? Authorizing Provider  buPROPion (WELLBUTRIN XL) 150 MG 24 hr tablet Take 150 mg by mouth daily. 12/20/17  Yes [provider]  carbidopa-levodopa (SINEMET CR) 50-200 MG tablet Take 1 tablet by mouth at bedtime.   Yes [provider]  carbidopa-levodopa (SINEMET IR) 25-100 MG tablet TK 1 T PO four times a day 08/16/16  Yes [provider]  folic acid (FOLVITE) 1 MG tablet Take 1 tablet (1 mg total) by mouth daily. 12/13/17  Yes Fritzi Mandes, MD  furosemide (LASIX) 20 MG tablet Take 20 mg by mouth daily. 12/18/17  Yes [provider]  lisinopril (PRINIVIL,ZESTRIL) 20 MG tablet Take 20 mg by mouth daily.    Yes [provider]  metoprolol tartrate (LOPRESSOR) 25 MG tablet Take 2 tablets (50 mg total) by mouth 2 (two) times daily. 12/12/17  Yes Fritzi Mandes, MD  pantoprazole (PROTONIX) 40 MG tablet Take 1 tablet (40 mg total) by mouth 2 (two) times daily. 12/12/17  Yes Fritzi Mandes, MD  potassium chloride 20 MEQ/15ML (10%) SOLN Take 20 mEq by mouth daily. 12/04/17  Yes [provider]  sertraline (ZOLOFT) 50 MG tablet Take 1 tablet (50 mg total) by mouth  daily. 08/08/16  Yes Rainey Pines, MD  traZODone (DESYREL) 100 MG tablet Take 100 mg by mouth at bedtime. 01/17/18  Yes [provider]  vitamin B-12 (CYANOCOBALAMIN) 100 MCG tablet Take 100 mcg by mouth daily.   Yes [provider]  baclofen (LIORESAL) 10 MG tablet Take 10 mg by mouth daily.  10/03/16   [provider]  gabapentin (NEURONTIN) 300 MG capsule TAKE 1 CAPSULE(300 MG) BY MOUTH THREE TIMES DAILY 08/02/16   [provider]      PHYSICAL EXAMINATION:   VITAL SIGNS: Blood pressure (!) 127/110, pulse 68, temperature 98.4 F (36.9 C), temperature source Oral, resp. rate 17, height 5\' 4"  (1.626 m), weight 60.8 kg, SpO2 97 %.  GENERAL:  63 y.o.-year-old patient lying in the bed with no acute distress.  EYES: Pupils equal, round, reactive to light and accommodation. No scleral icterus. Extraocular muscles intact.  HEENT: Head atraumatic, normocephalic. Oropharynx and nasopharynx clear.  NECK:  Supple, no jugular venous distention. No thyroid enlargement, no tenderness.  LUNGS: Normal breath sounds bilaterally,  no wheezing, rales,rhonchi or crepitation. No use of accessory muscles of respiration.  CARDIOVASCULAR: S1, S2 normal. No murmurs, rubs, or gallops.  ABDOMEN: Soft, nontender, nondistended. Bowel sounds present. No organomegaly or mass.  EXTREMITIES: No pedal edema, cyanosis, or clubbing.  NEUROLOGIC: Cranial nerves II through XII are intact. Muscle strength 4/5 in all extremities. Sensation intact. Gait not checked.  Excessive shaking in all extremities. PSYCHIATRIC: The patient is alert and oriented x 3.  SKIN: No obvious rash, lesion, or ulcer.   LABORATORY PANEL:   CBC Recent Labs  Lab 01/23/18 1423  WBC 7.5  HGB 13.1  HCT 40.3  PLT 149*  MCV 96.2  MCH 31.3  MCHC 32.5  RDW 14.6  LYMPHSABS 1.2  MONOABS 1.0  EOSABS 0.1  BASOSABS 0.0    ------------------------------------------------------------------------------------------------------------------  Chemistries  Recent Labs  Lab 01/23/18 1556  NA 131*  K 4.3  CL 95*  CO2 26  GLUCOSE 85  BUN 13  CREATININE 0.64  CALCIUM 8.9  AST 55*  ALT 11  ALKPHOS 123  BILITOT 1.5*   ------------------------------------------------------------------------------------------------------------------ estimated creatinine clearance is 81.2 mL/min (by C-G formula based on SCr of 0.64 mg/dL). ------------------------------------------------------------------------------------------------------------------ No results for input(s): TSH, T4TOTAL, T3FREE, THYROIDAB in the last 72 hours.  Invalid input(s): FREET3   Coagulation profile No results for input(s): INR, PROTIME in the last 168 hours. ------------------------------------------------------------------------------------------------------------------- No results for input(s): DDIMER in the last 72 hours. -------------------------------------------------------------------------------------------------------------------  Cardiac Enzymes Recent Labs  Lab 01/23/18 1423  TROPONINI <0.03   ------------------------------------------------------------------------------------------------------------------ Invalid input(s): POCBNP  ---------------------------------------------------------------------------------------------------------------  Urinalysis    Component Value Date/Time   COLORURINE YELLOW (A) 09/20/2016 1011   APPEARANCEUR CLEAR (A) 09/20/2016 1011   LABSPEC 1.006 09/20/2016 1011   PHURINE 6.0 09/20/2016 1011   GLUCOSEU NEGATIVE 09/20/2016 1011   HGBUR NEGATIVE 09/20/2016 1011   BILIRUBINUR NEGATIVE 09/20/2016 1011   KETONESUR NEGATIVE 09/20/2016 1011   PROTEINUR NEGATIVE 09/20/2016 1011   NITRITE NEGATIVE 09/20/2016 1011   LEUKOCYTESUR NEGATIVE 09/20/2016 1011     RADIOLOGY: Dg Chest 2  View  Result Date: 01/23/2018 CLINICAL DATA:  Increasing weakness. EXAM: CHEST - 2 VIEW COMPARISON:  10/04/2017 FINDINGS: Cardiomediastinal silhouette is normal. Mediastinal contours appear intact. There is no evidence of pleural effusion or pneumothorax. Linear peribronchial thickening in bilateral lower lobes, right greater than left. Previously noted compression deformities of T6 through T9, and T11 through L3 are less well visualized, but grossly stable. Vertebroplasty of T9. Soft tissues are grossly normal. IMPRESSION: Linear peribronchial thickening in bilateral lower lobes, right greater the left, may represent atelectasis versus scarring. Chronic compression fractures of the thoraco lumbar spine. Electronically Signed   By: Fidela Salisbury M.D.   On: 01/23/2018 16:01   Ct Head Wo Contrast  Result Date: 01/23/2018 CLINICAL DATA:  Increased weakness, 5 falls in the past 5 days, history Parkinson's, hypertension, atrial fibrillation EXAM: CT HEAD WITHOUT CONTRAST TECHNIQUE: Contiguous axial images were obtained from the base of the skull through the vertex without intravenous contrast. Sagittal and coronal MPR images reconstructed from axial data set. COMPARISON:  10/04/2017 FINDINGS: Brain: Generalized atrophy. Normal ventricular morphology. No midline shift or mass effect. Small vessel chronic ischemic changes of deep cerebral white matter. No intracranial hemorrhage, mass lesion, evidence of acute infarction, or extra-axial fluid collection. Vascular: Minimal atherosclerotic calcifications within internal carotid arteries at skull base. No hyperdense vessels. Skull: Intact Sinuses/Orbits: Intraorbital soft tissue planes and paranasal sinuses clear Other: N/A IMPRESSION: Atrophy with small vessel chronic ischemic changes of deep  cerebral white matter. No acute intracranial abnormalities. Electronically Signed   By: Lavonia Dana M.D.   On: 01/23/2018 15:45    EKG: Orders placed or performed during  the hospital encounter of 01/23/18  . EKG 12-Lead  . EKG 12-Lead  . ED EKG  . ED EKG    IMPRESSION AND PLAN:  *Alcohol withdrawal We will admit to hospitalist service, keep on CIWA protocol. Give Ativan IV or oral as needed. Vitamin replacements as per protocol.  *Parkinson's disease Continue home medications.  *Imbalance and fall As I confirmed with his wife, up until 2 days ago he was not falling down even though he had Parkinson's disease and some underlying shaking at baseline. I strongly feel this recent worsening is due to his alcohol withdrawal as he stopped drinking for last 2 days. Physical therapy evaluation was done in emergency room with suggested SNF placement.  But I think is still too early to decide on that and let him go through his alcohol withdrawal and once cleared he may be back to his baseline.  *Hypertension Continue home medications.  All the records are reviewed and case discussed with ED provider. Management plans discussed with the patient, family and they are in agreement.  CODE STATUS: Full code Code Status History    Date Active Date Inactive Code Status Order ID Comments User Context   12/09/2017 1727 12/12/2017 1813 Full Code [id number]  Gorden Harms, MD Inpatient   09/20/2016 0944 09/22/2016 1726 Full Code [id number]  Hillary Bow, MD ED   03/15/2016 1409 03/15/2016 1748 Full Code [id number]  Hessie Knows, MD Inpatient   01/28/2016 0123 01/31/2016 1619 Full Code [id number]  Lance Coon, MD Inpatient     Patient's wife was present in the room during my visit.  TOTAL TIME TAKING CARE OF THIS PATIENT: 45 minutes.    Vaughan Basta M.D on 01/23/2018   Between 7am to 6pm - Pager - [phone]  After 6pm go to [email or web address] - password [email or web address] [facility]  Office  [phone]  CC: Primary care physician; [name], MD   Note: This dictation was prepared with Dragon dictation along with smaller phrase  technology. Any transcriptional errors that result from this process are unintentional.

## 2018-01-23 NOTE — Care Management Note (Signed)
Case Management Note  Patient Details  Name: Charles Stanton MRN: [id number] Date of Birth: 10-09-1955  Subjective/Objective:   Patient is being seen in the ED after a fall.  Patient is from home with wife, wife is at the bedside.  Patient is open with [facility] for PT and OT.  Patient walks with a walker at home but reports having frequent falls multiple times per day for the past week.  Patient has a history of Parkinson's disease and has a constant tremor.  Patient also has a shower chair and bedside commode at home.  RN, aide, and SW for home health ordered, Joelene Millin with [facility] notified of new orders and patient visit to the ED.  PCP verified as Dr. Ginette Pitman and pt's wife reports they have an appointment on this Thursday.  Patient will be discharged home with wife.  RNCM suggested patient and wife look into personal care agencies and offered to give list of agencies, wife states "I don't think we are at that point yet".   RNCM made patient and wife aware that personal care services are an out of pocket expense.   Patient and wife have no questions and cannot think of any other needs at this time.  Doran Clay RN BSN [phone]                   Action/Plan: Discharge home with additional home health services.  Home Health agency [facility].  Joelene Millin with [facility] aware of new orders.   Expected Discharge Date:                  Expected Discharge Plan:  [place]  In-House Referral:     Discharge planning Services  CM Consult  Post Acute Care Choice:    Choice offered to:     DME Arranged:    DME Agency:     HH Arranged:  RN, PT, OT, Nurse's Aide, Social Work [employer] Agency:  [facility](Patient open with [facility] added RN, aide, SW)  Status of Service:  Completed, signed off  If discussed at [employer], dates discussed:    Additional Comments:  Shelbie Hutching, RN 01/23/2018, 5:19 PM

## 2018-01-23 NOTE — ED Notes (Signed)
Patient transported to CT 

## 2018-01-23 NOTE — Evaluation (Signed)
Physical Therapy Evaluation Patient Details Name: Charles Stanton MRN: [id number] DOB: Feb 22, 1955 Today's Date: 01/23/2018   History of Present Illness  63 y/o male here with a week of gradually increasing weakness.  Has had 5 falls on day of admit.  h/o Parkinsons, ETOH abuse.  Clinical Impression  Pt did poorly with PT exam and struggled to get to sitting at EOB, and was completely unable to standing despite multiple attempts and heavy assist.  Apparently pt is essentially independent with mobility with RW, SPC and at time w/o AD but has completely been unable to stand (falling backward X5 today).  Pt is completely unsafe to go home and though he was motivated to try he could not even attain standing with max assist.    Follow Up Recommendations SNF    Equipment Recommendations  None recommended by PT    Recommendations for Other Services       Precautions / Restrictions Precautions Precautions: Fall Restrictions Weight Bearing Restrictions: No      Mobility  Bed Mobility Overal bed mobility: Needs Assistance Bed Mobility: Supine to Sit;Sit to Supine     Supine to sit: Mod assist Sit to supine: Mod assist   General bed mobility comments: Pt unable to initiate getting to EOB w/o considerable assist, continued to need assist to get to sitting EOB  Transfers Overall transfer level: Needs assistance Equipment used: Rolling walker (2 wheeled) Transfers: Sit to/from Stand Sit to Stand: Total assist         General transfer comment: 3x attempt to stand with poor results.  Pt leaning back heavily and appears to have consiterable anxiety as well as uncontrolled Parkinsonian (ETOH?) tremors, unable to get weight over walker enough to keep upright  Ambulation/Gait             General Gait Details: Pt unable to attain standing, much less attempt to take a step  Stairs            Wheelchair Mobility    Modified Rankin (Stroke Patients Only)       Balance  Overall balance assessment: Needs assistance Sitting-balance support: Bilateral upper extremity supported Sitting balance-Leahy Scale: Poor Sitting balance - Comments: Pt leaning back, but able to maintain sitting briefly w/o assist      Standing balance-Leahy Scale: Zero Standing balance comment: unable to attain upright despite heavy assist                             Pertinent Vitals/Pain Pain Assessment: 0-10 Pain Score: 4  Pain Location: back pain, general sorenessw    Home Living Family/patient expects to be discharged to:: Skilled nursing facility Living Arrangements: Spouse/significant other Available Help at Discharge: Available PRN/intermittently(wife at work t/o the day)           Home Equipment: Environmental consultant - 2 wheels;Cane - single point      Prior Function Level of Independence: Independent with assistive device(s)         Comments: Even yesterday pt walked inand out of resturant     Hand Dominance        Extremity/Trunk Assessment   Upper Extremity Assessment Upper Extremity Assessment: Generalized weakness(severe tremors with attempts at testing)    Lower Extremity Assessment Lower Extremity Assessment: Generalized weakness(tremors with intentional movement)       Communication   Communication: No difficulties  Cognition Arousal/Alertness: Awake/alert Behavior During Therapy: WFL for tasks assessed/performed;Anxious Overall Cognitive Status: Within  Functional Limits for tasks assessed                                        General Comments      Exercises     Assessment/Plan    PT Assessment Patient needs continued PT services  PT Problem List Decreased strength;Decreased activity tolerance;Decreased balance;Decreased mobility;Decreased coordination;Decreased range of motion;Decreased knowledge of use of DME;Decreased safety awareness;Pain       PT Treatment Interventions DME instruction;Gait  training;Functional mobility training;Therapeutic activities;Therapeutic exercise;Balance training;Neuromuscular re-education;Patient/family education    PT Goals (Current goals can be found in the Care Plan section)  Acute Rehab PT Goals Patient Stated Goal: go home PT Goal Formulation: With patient Time For Goal Achievement: 02/06/18 Potential to Achieve Goals: Fair    Frequency Min 2X/week   Barriers to discharge        Co-evaluation               AM-PAC PT "6 Clicks" Mobility  Outcome Measure Help needed turning from your back to your side while in a flat bed without using bedrails?: A Lot Help needed moving from lying on your back to sitting on the side of a flat bed without using bedrails?: A Lot Help needed moving to and from a bed to a chair (including a wheelchair)?: Total Help needed standing up from a chair using your arms (e.g., wheelchair or bedside chair)?: Total Help needed to walk in hospital room?: Total Help needed climbing 3-5 steps with a railing? : Total 6 Click Score: 8    End of Session Equipment Utilized During Treatment: Gait belt Activity Tolerance: Patient limited by fatigue;Patient limited by pain Patient left: in bed;with call bell/phone within reach;with family/visitor present Nurse Communication: Mobility status PT Visit Diagnosis: Muscle weakness (generalized) (M62.81);Unsteadiness on feet (R26.81)    Time: 7017-7939 PT Time Calculation (min) (ACUTE ONLY): 25 min   Charges:   PT Evaluation $PT Eval Low Complexity: 1 Low          Kreg Shropshire, DPT 01/23/2018, 6:14 PM

## 2018-01-23 NOTE — ED Provider Notes (Signed)
[facility] Emergency Department Provider Note  ____________________________________________   I have reviewed the triage vital signs and the nursing notes. Where available I have reviewed prior notes and, if possible and indicated, outside hospital notes.    HISTORY  Chief Complaint Weakness    HPI Charles Stanton is a 63 y.o. male  History of daily alcohol abuse has not had any alcohol since Sunday, feels more shaky and has had several falls.  States that usually he falls when he gets up without his walker he is supposed to use his walker all the time but he has not been using it.  He does have home health aides.  He did hit his head when he fell.  No nausea no vomiting no diarrhea.  He did not have any focal neurologic deficits over his baseline parkinsonism but he is unable to ambulate safely at home he seems to be getting progressively more shaking over the last day or so     Past Medical History:  Diagnosis Date  . Abnormal liver function   . Anemia   . Anxiety   . Atrial fibrillation ([place])   . Colonic mass   . Depression   . Dizziness   . Dry cough   . History of colon polyps   . Hypercholesteremia   . Hyperlipemia   . Hypertension   . Palpitations   . Parkinson's disease ([place])   . Parkinson's disease ([place])   . PVD (peripheral vascular disease) ([place])   . Tremors of nervous system     Patient Active Problem List   Diagnosis Date Noted  . GIB (gastrointestinal bleeding) 12/09/2017  . A-fib ([place]) 12/09/2017  . Esophageal obstruction 10/10/2016  . Esophageal ulceration 10/10/2016  . GI bleed 09/20/2016  . Low vitamin B12 level 03/18/2016  . S/P kyphoplasty 03/18/2016  . Basal cell carcinoma 02/08/2016  . Atrial fibrillation ([place]) 01/27/2016  . Colitis 01/27/2016  . HLD (hyperlipidemia) 01/27/2016  . Anxiety 01/27/2016  . Hypokalemia 01/27/2016  . Major depressive disorder, recurrent episode, moderate ([place]) 04/14/2015  . Anxiety,  generalized 12/06/2013  . Parkinson disease ([place]) 08/07/2013  . Dysphagia 05/12/2013  . Esophageal mass 05/12/2013  . GERD (gastroesophageal reflux disease) 05/12/2013  . Vomiting 05/12/2013  . Open bite of lower leg 05/10/2013  . Dog bite of lower leg 05/10/2013  . Abnormal finding on liver function 03/25/2013  . Benign neoplasm 03/25/2013  . Dizziness 03/25/2013  . Type 2 diabetes mellitus ([place]) 03/25/2013  . BP (high blood pressure) 03/25/2013  . Awareness of heartbeats 03/25/2013  . Pure hypercholesterolemia 03/25/2013  . Infective tonsillitis 03/25/2013  . Adenomatous polyp 03/25/2013    Past Surgical History:  Procedure Laterality Date  . BACK SURGERY     Kyphoplasty   March 2018  . COLONOSCOPY    . COLONOSCOPY WITH PROPOFOL N/A 12/12/2017   Procedure: COLONOSCOPY WITH PROPOFOL;  Surgeon: Jonathon Bellows, MD;  Location: [facility] ENDOSCOPY;  Service: Gastroenterology;  Laterality: N/A;  . ESOPHAGOGASTRODUODENOSCOPY N/A 12/11/2017   Procedure: ESOPHAGOGASTRODUODENOSCOPY (EGD);  Surgeon: Jonathon Bellows, MD;  Location: [facility] ENDOSCOPY;  Service: Gastroenterology;  Laterality: N/A;  . ESOPHAGOGASTRODUODENOSCOPY (EGD) WITH PROPOFOL N/A 09/21/2016   Procedure: ESOPHAGOGASTRODUODENOSCOPY (EGD) WITH PROPOFOL;  Surgeon: Jonathon Bellows, MD;  Location: [facility] ENDOSCOPY;  Service: Gastroenterology;  Laterality: N/A;  . ESOPHAGOGASTRODUODENOSCOPY (EGD) WITH PROPOFOL N/A 11/07/2016   Procedure: ESOPHAGOGASTRODUODENOSCOPY (EGD) WITH PROPOFOL;  Surgeon: Jonathon Bellows, MD;  Location: [facility] ENDOSCOPY;  Service: Gastroenterology;  Laterality: N/A;  . HERNIA  REPAIR     left inguinal hernia repair as an infant  . KYPHOPLASTY N/A 02/11/2016   Procedure: KYPHOPLASTY  L2,T9;  Surgeon: Hessie Knows, MD;  Location: [facility] ORS;  Service: Orthopedics;  Laterality: N/A;  . KYPHOPLASTY N/A 03/15/2016   Procedure: KYPHOPLASTY L3;  Surgeon: Hessie Knows, MD;  Location: [facility] ORS;  Service: Orthopedics;  Laterality: N/A;  . skin  cancer removed    . TONSILLECTOMY      Prior to Admission medications   Medication Sig Start Date End Date Taking? Authorizing Provider  aspirin EC 81 MG tablet Take 81 mg by mouth daily.    [provider]  baclofen (LIORESAL) 10 MG tablet Take 10 mg by mouth daily.  10/03/16   [provider]  carbidopa-levodopa (SINEMET CR) 50-200 MG tablet Take 1 tablet by mouth at bedtime.    [provider]  carbidopa-levodopa (SINEMET IR) 25-100 MG tablet TK 1 T PO four times a day 08/16/16   [provider]  cyclobenzaprine (FLEXERIL) 10 MG tablet Take by mouth. 05/18/16   [provider]  folic acid (FOLVITE) 1 MG tablet Take 1 tablet (1 mg total) by mouth daily. 12/13/17   Fritzi Mandes, MD  gabapentin (NEURONTIN) 300 MG capsule TAKE 1 CAPSULE(300 MG) BY MOUTH THREE TIMES DAILY 08/02/16   [provider]  lisinopril (PRINIVIL,ZESTRIL) 20 MG tablet Take 20 mg by mouth daily.     [provider]  metoprolol tartrate (LOPRESSOR) 25 MG tablet Take 2 tablets (50 mg total) by mouth 2 (two) times daily. 12/12/17   Fritzi Mandes, MD  pantoprazole (PROTONIX) 40 MG tablet Take 1 tablet (40 mg total) by mouth 2 (two) times daily. 12/12/17   Fritzi Mandes, MD  potassium chloride 20 MEQ/15ML (10%) SOLN Take 20 mEq by mouth daily. 12/04/17   [provider]  sertraline (ZOLOFT) 50 MG tablet Take 1 tablet (50 mg total) by mouth daily. 08/08/16   Rainey Pines, MD  simvastatin (ZOCOR) 20 MG tablet Take 20 mg by mouth at bedtime. Reported on 04/14/2015    [provider]    Allergies Amantadine  Family History  Problem Relation Age of Onset  . Heart Problems Mother   . Dementia Mother     Social History Social History   Tobacco Use  . Smoking status: Never Smoker  . Smokeless tobacco: Never Used  Substance Use Topics  . Alcohol use: Yes    Alcohol/week: 2.0 standard drinks    Types: 2 Cans of beer per week    Comment: Daily 2 beers and  shot of whiskey  . Drug use: No    Review of Systems Constitutional: No fever/chills Eyes: No visual changes. ENT: No sore throat. No stiff neck no neck pain Cardiovascular: Denies chest pain. Respiratory: Denies shortness of breath. Gastrointestinal:   no vomiting.  No diarrhea.  No constipation. Genitourinary: Negative for dysuria. Musculoskeletal: Negative lower extremity swelling Skin: Negative for rash. Neurological: Negative for severe headaches, focal weakness or numbness.   ____________________________________________   PHYSICAL EXAM:  VITAL SIGNS: ED Triage Vitals  Enc Vitals Group     BP 01/23/18 1416 112/71     Pulse Rate 01/23/18 1416 60     Resp 01/23/18 1416 20     Temp 01/23/18 1416 99.3 F (37.4 C)     Temp Source 01/23/18 1416 Oral     SpO2 01/23/18 1416 99 %     Weight 01/23/18 1417 134 lb (60.8 kg)  Height 01/23/18 1417 5\' 4"  (1.626 m)     Head Circumference --      Peak Flow --      Pain Score 01/23/18 1416 4     Pain Loc --      Pain Edu? --      Excl. in [place]? --     Constitutional: Alert and oriented. Well appearing and in no acute distress. Eyes: Conjunctivae are normal Head: Atraumatic HEENT: No congestion/rhinnorhea. Mucous membranes are moist.  Oropharynx non-erythematous Neck:   Nontender with no meningismus, no masses, no stridor Cardiovascular: Normal rate, regular rhythm. Grossly normal heart sounds.  Good peripheral circulation. Respiratory: Normal respiratory effort.  No retractions. Lungs CTAB. Abdominal: Soft and nontender. No distention. No guarding no rebound Back:  There is no focal tenderness or step off.  there is no midline tenderness there are no lesions noted. there is no CVA tenderness Musculoskeletal: No lower extremity tenderness, no upper extremity tenderness. No joint effusions, no DVT signs strong distal pulses no edema Neurologic: Normal speech and language significant diffuse tremor noted, no focal deficits Skin:   Skin is warm, dry and intact. No rash noted. Psychiatric: Mood and affect are normal. Speech and behavior are normal.  ____________________________________________   LABS (all labs ordered are listed, but only abnormal results are displayed)  Labs Reviewed  CBC WITH DIFFERENTIAL/PLATELET - Abnormal; Notable for the following components:      Result Value   RBC 4.19 (*)    Platelets 149 (*)    Abs Immature Granulocytes 0.10 (*)    All other components within normal limits  COMPREHENSIVE METABOLIC PANEL - Abnormal; Notable for the following components:   Sodium 131 (*)    Chloride 95 (*)    AST 55 (*)    Total Bilirubin 1.5 (*)    All other components within normal limits  ETHANOL  TROPONIN I  URINALYSIS, COMPLETE (UACMP) WITH MICROSCOPIC  URINE DRUG SCREEN, QUALITATIVE ([facility] ONLY)  FOLATE    Pertinent labs  results that were available during my care of the patient were reviewed by me and considered in my medical decision making (see chart for details). ____________________________________________  EKG  I personally interpreted any EKGs ordered by me or triage Sinus rhythm rate 64 bpm, diffuse nonspecific ST changes, normal axis, no acute ST elevation ____________________________________________  RADIOLOGY  Pertinent labs & imaging results that were available during my care of the patient were reviewed by me and considered in my medical decision making (see chart for details). If possible, patient and/or family made aware of any abnormal findings.  Dg Chest 2 View  Result Date: 01/23/2018 CLINICAL DATA:  Increasing weakness. EXAM: CHEST - 2 VIEW COMPARISON:  10/04/2017 FINDINGS: Cardiomediastinal silhouette is normal. Mediastinal contours appear intact. There is no evidence of pleural effusion or pneumothorax. Linear peribronchial thickening in bilateral lower lobes, right greater than left. Previously noted compression deformities of T6 through T9, and T11 through L3 are  less well visualized, but grossly stable. Vertebroplasty of T9. Soft tissues are grossly normal. IMPRESSION: Linear peribronchial thickening in bilateral lower lobes, right greater the left, may represent atelectasis versus scarring. Chronic compression fractures of the thoraco lumbar spine. Electronically Signed   By: Fidela Salisbury M.D.   On: 01/23/2018 16:01   Ct Head Wo Contrast  Result Date: 01/23/2018 CLINICAL DATA:  Increased weakness, 5 falls in the past 5 days, history Parkinson's, hypertension, atrial fibrillation EXAM: CT HEAD WITHOUT CONTRAST TECHNIQUE: Contiguous axial images were obtained  from the base of the skull through the vertex without intravenous contrast. Sagittal and coronal MPR images reconstructed from axial data set. COMPARISON:  10/04/2017 FINDINGS: Brain: Generalized atrophy. Normal ventricular morphology. No midline shift or mass effect. Small vessel chronic ischemic changes of deep cerebral white matter. No intracranial hemorrhage, mass lesion, evidence of acute infarction, or extra-axial fluid collection. Vascular: Minimal atherosclerotic calcifications within internal carotid arteries at skull base. No hyperdense vessels. Skull: Intact Sinuses/Orbits: Intraorbital soft tissue planes and paranasal sinuses clear Other: N/A IMPRESSION: Atrophy with small vessel chronic ischemic changes of deep cerebral white matter. No acute intracranial abnormalities. Electronically Signed   By: Lavonia Dana M.D.   On: 01/23/2018 15:45   ____________________________________________    PROCEDURES  Procedure(s) performed: None  Procedures  Critical Care performed: None  ____________________________________________   INITIAL IMPRESSION / ASSESSMENT AND PLAN / ED COURSE  Pertinent labs & imaging results that were available during my care of the patient were reviewed by me and considered in my medical decision making (see chart for details).  Patient here with a significant  tremor, unclear how much of this is from Parkinson's and how much of this is from alcohol withdrawal also he is unsafe for discharge according to physical therapy they did evaluate him here he cannot even walk a few steps.  Given that this is a decline from his baseline, we will admit for further observation and I am giving him thiamine, folate, and Ativan for possible early withdrawal    ____________________________________________   FINAL CLINICAL IMPRESSION(S) / ED DIAGNOSES  Final diagnoses:  Weakness      This chart was dictated using voice recognition software.  Despite best efforts to proofread,  errors can occur which can change meaning.      Schuyler Amor, MD 01/23/18 [phone]

## 2018-01-23 NOTE — Progress Notes (Signed)
Family Meeting Note  Advance Directive:yes  Today a meeting took place with the Patient and spouse.   The following clinical team members were present during this meeting:MD  The following were discussed:Patient's diagnosis: Alcohol dependence and withdrawal now, fall, Parkinson's disease, Patient's progosis: Unable to determine and Goals for treatment: Full Code  Additional follow-up to be provided: PMD  Time spent during discussion:20 minutes  Vaughan Basta, MD

## 2018-01-23 NOTE — ED Triage Notes (Signed)
Pt arrived via EMS from home d/t increased weakness over the past week. EMS reports pt has had 5 falls in the last 5 days and decrease in ambulation from base line. Pt c/o bilateral leg pain x2 weeks. Pt has hx of parkinsons, A&O x4 at this time.

## 2018-01-24 ENCOUNTER — Other Ambulatory Visit: Payer: Self-pay

## 2018-01-24 LAB — URINALYSIS, COMPLETE (UACMP) WITH MICROSCOPIC
BILIRUBIN URINE: NEGATIVE
Glucose, UA: NEGATIVE mg/dL
Hgb urine dipstick: NEGATIVE
Ketones, ur: 20 mg/dL — AB
Leukocytes, UA: NEGATIVE
Nitrite: NEGATIVE
Protein, ur: NEGATIVE mg/dL
Specific Gravity, Urine: 1.017 (ref 1.005–1.030)
pH: 5 (ref 5.0–8.0)

## 2018-01-24 LAB — BASIC METABOLIC PANEL
Anion gap: 10 (ref 5–15)
BUN: 11 mg/dL (ref 8–23)
CO2: 26 mmol/L (ref 22–32)
Calcium: 8.7 mg/dL — ABNORMAL LOW (ref 8.9–10.3)
Chloride: 99 mmol/L (ref 98–111)
Creatinine, Ser: 0.6 mg/dL — ABNORMAL LOW (ref 0.61–1.24)
GFR calc Af Amer: >60 mL/min (ref >60)
GFR calc non Af Amer: >60 mL/min (ref >60)
Glucose, Bld: 72 mg/dL (ref 70–99)
Potassium: 3.2 mmol/L — ABNORMAL LOW (ref 3.5–5.1)
SODIUM: 135 mmol/L (ref 135–145)

## 2018-01-24 LAB — URINE DRUG SCREEN, QUALITATIVE (ARMC ONLY)
Amphetamines, Ur Screen: NOT DETECTED
BARBITURATES, UR SCREEN: NOT DETECTED
Benzodiazepine, Ur Scrn: POSITIVE — AB
Cannabinoid 50 Ng, Ur ~~LOC~~: NOT DETECTED
Cocaine Metabolite,Ur ~~LOC~~: NOT DETECTED
MDMA (Ecstasy)Ur Screen: NOT DETECTED
Methadone Scn, Ur: NOT DETECTED
Opiate, Ur Screen: NOT DETECTED
Phencyclidine (PCP) Ur S: NOT DETECTED
Tricyclic, Ur Screen: NOT DETECTED

## 2018-01-24 LAB — CBC
HCT: 35.1 % — ABNORMAL LOW (ref 39.0–52.0)
Hemoglobin: 11.4 g/dL — ABNORMAL LOW (ref 13.0–17.0)
MCH: 31.4 pg (ref 26.0–34.0)
MCHC: 32.5 g/dL (ref 30.0–36.0)
MCV: 96.7 fL (ref 80.0–100.0)
Platelets: 123 10*3/uL — ABNORMAL LOW (ref 150–400)
RBC: 3.63 MIL/uL — ABNORMAL LOW (ref 4.22–5.81)
RDW: 14.8 % (ref 11.5–15.5)
WBC: 5 10*3/uL (ref 4.0–10.5)
nRBC: 0 % (ref 0.0–0.2)

## 2018-01-24 LAB — AMMONIA: AMMONIA: 14 umol/L (ref 9–35)

## 2018-01-24 MED ORDER — METOPROLOL TARTRATE 25 MG PO TABS
25.0000 mg | ORAL_TABLET | Freq: Two times a day (BID) | ORAL | Status: DC
  Administered 2018-01-24 (×2): 25 mg via ORAL
  Filled 2018-01-24 (×2): qty 1

## 2018-01-24 MED ORDER — SODIUM CHLORIDE 0.9 % IV SOLN
INTRAVENOUS | Status: DC
  Administered 2018-01-24 – 2018-01-25 (×2): via INTRAVENOUS

## 2018-01-24 MED ORDER — POTASSIUM CHLORIDE CRYS ER 20 MEQ PO TBCR
40.0000 meq | EXTENDED_RELEASE_TABLET | Freq: Once | ORAL | Status: AC
  Administered 2018-01-24: 40 meq via ORAL
  Filled 2018-01-24: qty 2

## 2018-01-24 MED ORDER — THIAMINE HCL 100 MG/ML IJ SOLN
500.0000 mg | Freq: Every day | INTRAVENOUS | Status: DC
  Administered 2018-01-24 – 2018-01-26 (×3): 500 mg via INTRAVENOUS
  Filled 2018-01-24 (×3): qty 5

## 2018-01-24 MED ORDER — ORAL CARE MOUTH RINSE
15.0000 mL | Freq: Two times a day (BID) | OROMUCOSAL | Status: DC
  Administered 2018-01-25 – 2018-01-27 (×5): 15 mL via OROMUCOSAL

## 2018-01-24 NOTE — Plan of Care (Signed)

## 2018-01-24 NOTE — Progress Notes (Signed)
Patient ID: Charles Stanton, male   DOB: 1955-11-30, 63 y.o.   MRN: [id number]  [employer] PROGRESS NOTE  Charles Stanton WUX:[id number] DOB: 1955-04-23 DOA: 01/23/2018 PCP: [name], MD  HPI/Subjective: Patient feels sleepy this morning.  Slept last night.  Has been having quite a bit of falls.  Has been confused.  He does have Parkinson's so he normally does have a tremor.  He normally walks with a walker and/or a cane.  He has been having some hallucinations.  Objective: Vitals:   01/24/18 1108 01/24/18 1110  BP: 140/90 122/85  Pulse: 82 (!) 106  Resp: 18 18  Temp:    SpO2: 95% 95%    Filed Weights   01/23/18 1417  Weight: 60.8 kg    ROS: Review of Systems  Constitutional: Positive for malaise/fatigue. Negative for chills and fever.  Eyes: Negative for blurred vision.  Respiratory: Negative for cough and shortness of breath.   Cardiovascular: Negative for chest pain.  Gastrointestinal: Negative for abdominal pain, constipation, diarrhea, nausea and vomiting.  Genitourinary: Negative for dysuria.  Musculoskeletal: Negative for joint pain.  Neurological: Positive for dizziness. Negative for headaches.   Exam: Physical Exam  Constitutional: He is oriented to person, place, and time.  HENT:  Nose: No mucosal edema.  Mouth/Throat: No oropharyngeal exudate or posterior oropharyngeal edema.  Eyes: Pupils are equal, round, and reactive to light. Conjunctivae, EOM and lids are normal.  Neck: No JVD present. Carotid bruit is not present. No edema present. No thyroid mass and no thyromegaly present.  Cardiovascular: S1 normal and S2 normal. Exam reveals no gallop.  No murmur heard. Pulses:      Dorsalis pedis pulses are 2+ on the right side and 2+ on the left side.  Respiratory: No respiratory distress. He has no wheezes. He has no rhonchi. He has no rales.  GI: Soft. Bowel sounds are normal. There is no abdominal tenderness.  Musculoskeletal:     Right ankle: He  exhibits no swelling.     Left ankle: He exhibits no swelling.  Lymphadenopathy:    He has no cervical adenopathy.  Neurological: He is alert and oriented to person, place, and time. No cranial nerve deficit.  Able to straight leg raise with both legs but a little weaker on the right lower extremity than the left.  Skin: Skin is warm. No rash noted. Nails show no clubbing.  Psychiatric: He has a normal mood and affect.      Data Reviewed: Basic Metabolic Panel: Recent Labs  Lab 01/23/18 1556 01/24/18 0418  NA 131* 135  K 4.3 3.2*  CL 95* 99  CO2 26 26  GLUCOSE 85 72  BUN 13 11  CREATININE 0.64 0.60*  CALCIUM 8.9 8.7*   Liver Function Tests: Recent Labs  Lab 01/23/18 1556  AST 55*  ALT 11  ALKPHOS 123  BILITOT 1.5*  PROT 6.5  ALBUMIN 3.7    Recent Labs  Lab 01/24/18 1025  AMMONIA 14   CBC: Recent Labs  Lab 01/23/18 1423 01/24/18 0418  WBC 7.5 5.0  NEUTROABS 5.0  --   HGB 13.1 11.4*  HCT 40.3 35.1*  MCV 96.2 96.7  PLT 149* 123*   Cardiac Enzymes: Recent Labs  Lab 01/23/18 1423  TROPONINI <0.03     Studies: Dg Chest 2 View  Result Date: 01/23/2018 CLINICAL DATA:  Increasing weakness. EXAM: CHEST - 2 VIEW COMPARISON:  10/04/2017 FINDINGS: Cardiomediastinal silhouette is normal. Mediastinal contours appear intact. There is  no evidence of pleural effusion or pneumothorax. Linear peribronchial thickening in bilateral lower lobes, right greater than left. Previously noted compression deformities of T6 through T9, and T11 through L3 are less well visualized, but grossly stable. Vertebroplasty of T9. Soft tissues are grossly normal. IMPRESSION: Linear peribronchial thickening in bilateral lower lobes, right greater the left, may represent atelectasis versus scarring. Chronic compression fractures of the thoraco lumbar spine. Electronically Signed   By: Fidela Salisbury M.D.   On: 01/23/2018 16:01   Ct Head Wo Contrast  Result Date: 01/23/2018 CLINICAL  DATA:  Increased weakness, 5 falls in the past 5 days, history Parkinson's, hypertension, atrial fibrillation EXAM: CT HEAD WITHOUT CONTRAST TECHNIQUE: Contiguous axial images were obtained from the base of the skull through the vertex without intravenous contrast. Sagittal and coronal MPR images reconstructed from axial data set. COMPARISON:  10/04/2017 FINDINGS: Brain: Generalized atrophy. Normal ventricular morphology. No midline shift or mass effect. Small vessel chronic ischemic changes of deep cerebral white matter. No intracranial hemorrhage, mass lesion, evidence of acute infarction, or extra-axial fluid collection. Vascular: Minimal atherosclerotic calcifications within internal carotid arteries at skull base. No hyperdense vessels. Skull: Intact Sinuses/Orbits: Intraorbital soft tissue planes and paranasal sinuses clear Other: N/A IMPRESSION: Atrophy with small vessel chronic ischemic changes of deep cerebral white matter. No acute intracranial abnormalities. Electronically Signed   By: Lavonia Dana M.D.   On: 01/23/2018 15:45    Scheduled Meds: . baclofen  10 mg Oral Daily  . buPROPion  150 mg Oral Daily  . carbidopa-levodopa  1 tablet Oral QHS  . carbidopa-levodopa  1 tablet Oral QID  . folic acid  1 mg Oral Daily  . gabapentin  200 mg Oral BID  . heparin  5,000 Units Subcutaneous Q8H  . lisinopril  20 mg Oral Daily  . LORazepam  0-4 mg Intravenous Q6H   Followed by  . [START ON 01/25/2018] LORazepam  0-4 mg Intravenous Q12H  . metoprolol tartrate  25 mg Oral BID  . multivitamin with minerals  1 tablet Oral Daily  . pantoprazole  40 mg Oral BID  . sertraline  50 mg Oral Daily  . traZODone  100 mg Oral QHS  . vitamin B-12  100 mcg Oral Daily   Continuous Infusions: . sodium chloride 50 mL/hr at 01/24/18 1224  . thiamine injection Stopped (01/24/18 1211)    Assessment/Plan:  1. Alcohol withdrawal.  Hard to tell with his baseline tremor with Parkinson's disease.  We will give  high-dose thiamine.  CIWA protocol.  Continue to monitor closely.  Ammonia level normal. 2. Parkinson's disease.  Continue current medications of Sinemet 3. Acute encephalopathy.  Patient answers questions appropriately.  At times having some hallucinations.  This can happen with alcohol withdrawal and/or Parkinson's disease. 4. Weakness and falls.  Physical therapy recommended rehab. 5. Slight orthostatic hypotension.  Dropped down metoprolol dose and hold lisinopril.  Continue IV fluids. 6. Depression on Zoloft and Wellbutrin and trazodone  Code Status:     Code Status Orders  (From admission, onward)         Start     Ordered   01/23/18 2325  Full code  Continuous     01/23/18 2324        Code Status History    Date Active Date Inactive Code Status Order ID Comments User Context   12/09/2017 1727 12/12/2017 1813 Full Code [id number]  Gorden Harms, MD Inpatient   09/20/2016 0944 09/22/2016 1726 Full Code [id number]  Hillary Bow, MD ED   03/15/2016 1409 03/15/2016 1748 Full Code [id number]  Hessie Knows, MD Inpatient   01/28/2016 0123 01/31/2016 1619 Full Code [id number]  Lance Coon, MD Inpatient     Family Communication: Wife at the bedside Disposition Plan: To be determined based on clinical course  Time spent: 28 minutes  [place]

## 2018-01-25 LAB — BASIC METABOLIC PANEL
Anion gap: 7 (ref 5–15)
BUN: 8 mg/dL (ref 8–23)
CO2: 25 mmol/L (ref 22–32)
Calcium: 8.4 mg/dL — ABNORMAL LOW (ref 8.9–10.3)
Chloride: 104 mmol/L (ref 98–111)
Creatinine, Ser: 0.48 mg/dL — ABNORMAL LOW (ref 0.61–1.24)
GFR calc non Af Amer: >60 mL/min (ref >60)
Glucose, Bld: 78 mg/dL (ref 70–99)
Potassium: 3.5 mmol/L (ref 3.5–5.1)
Sodium: 136 mmol/L (ref 135–145)

## 2018-01-25 LAB — MAGNESIUM: Magnesium: 1.5 mg/dL — ABNORMAL LOW (ref 1.7–2.4)

## 2018-01-25 MED ORDER — NITROGLYCERIN 0.4 MG SL SUBL: 0.4000 mg | SUBLINGUAL_TABLET | SUBLINGUAL | Status: DC | PRN

## 2018-01-25 MED ORDER — METOPROLOL TARTRATE 25 MG PO TABS
12.5000 mg | ORAL_TABLET | Freq: Two times a day (BID) | ORAL | Status: DC
  Administered 2018-01-25 (×2): 12.5 mg via ORAL
  Filled 2018-01-25 (×2): qty 1

## 2018-01-25 MED ORDER — POTASSIUM CHLORIDE CRYS ER 20 MEQ PO TBCR
40.0000 meq | EXTENDED_RELEASE_TABLET | Freq: Once | ORAL | Status: AC
  Administered 2018-01-25: 11:00:00 40 meq via ORAL
  Filled 2018-01-25: qty 2

## 2018-01-25 MED ORDER — MAGNESIUM SULFATE 2 GM/50ML IV SOLN
2.0000 g | Freq: Once | INTRAVENOUS | Status: AC
  Administered 2018-01-25: 2 g via INTRAVENOUS
  Filled 2018-01-25: qty 50

## 2018-01-25 NOTE — Progress Notes (Signed)
Physical Therapy Treatment Patient Details Name: Charles Stanton MRN: [id number] DOB: [birth year] Today's Date: 01/25/2018    History of Present Illness 63 y/o male here with a week of gradually increasing weakness.  Has had 5 falls the day he came to ED.  h/o Parkinsons, ETOH abuse.    PT Comments    Pt continues to be very limited functionally with tremors and poor quality of motion with nearly all tasks.  He was better able to get to sitting today (still very unsteady and needing assist, especially with scooting) and with heavy assist was able to assume a standing position (with heavy reliance on walker and constant phyiscal assist to keep him from falling backward.) Pt not at all in a state to be able to go home safely, he did not know where he was and though eager and willing to participate with PT was profoundly limited and unsafe with even basic mobility tasks.     Follow Up Recommendations  SNF     Equipment Recommendations  None recommended by PT    Recommendations for Other Services       Precautions / Restrictions Precautions Precautions: Fall Restrictions Weight Bearing Restrictions: No    Mobility  Bed Mobility Overal bed mobility: Needs Assistance Bed Mobility: Supine to Sit;Sit to Supine     Supine to sit: Mod assist;Min assist     General bed mobility comments: Pt with exceeding effort to get to EOB, but consistently had issues with leaning/falling back.  He did a good bit of the work but ultimately needed assist to get to and maintain sitting at EOB.  Needed assist to scoot forward and get feet on the ground - unable to scoot unassisted  Transfers Overall transfer level: Needs assistance Equipment used: Rolling walker (2 wheeled) Transfers: Sit to/from Stand Sit to Stand: Max assist;Mod assist         General transfer comment: multiple attempts at standing.  Pt unable to get close to standing on multiple attempts w/o physical assist. with bed elevated,  heavy cuing for set up and sequencing pt able to rise with mod assist, though max assist on most attempts from low height  Ambulation/Gait Ambulation/Gait assistance: Max assist Gait Distance (Feet): 4 Feet Assistive device: Rolling walker (2 wheeled)       General Gait Details: first attempt at standing unsuccessful secondary to heavy retropulsion.  PT dropped height of walker to facilitate getting weight forward, he was able to take a few very shuffling, unsafe, tremorous steps with heavy assist to keep weight forward but ultimately was completely unsafe to attempt prolonged bout of ambulation   Stairs             Wheelchair Mobility    Modified Rankin (Stroke Patients Only)       Balance Overall balance assessment: Needs assistance Sitting-balance support: Bilateral upper extremity supported Sitting balance-Leahy Scale: Fair Sitting balance - Comments: Pt able to maintain sitting at EOB with heavy UE use and constant effort to keep from falling backward     Standing balance-Leahy Scale: Poor Standing balance comment: able to stand today (with heavy assist), but still very limited with retropulsion, tremors and inability to use walker appropriately                            Cognition Arousal/Alertness: Awake/alert Behavior During Therapy: [facility] for tasks assessed/performed;Anxious Overall Cognitive Status: Within Functional Limits for tasks assessed  Exercises General Exercises - Lower Extremity Ankle Circles/Pumps: AROM;10 reps Long Arc Quad: Strengthening;10 reps Heel Slides: Strengthening;10 reps Hip ABduction/ADduction: Strengthening;10 reps Hip Flexion/Marching: Strengthening;10 reps    General Comments        Pertinent Vitals/Pain Pain Location: again general soreness with no c/o focal pain    Home Living                      Prior Function            PT Goals (current  goals can now be found in the care plan section) Progress towards PT goals: Progressing toward goals    Frequency    Min 2X/week      PT Plan Current plan remains appropriate    Co-evaluation              AM-PAC PT "6 Clicks" Mobility   Outcome Measure  Help needed turning from your back to your side while in a flat bed without using bedrails?: A Lot Help needed moving from lying on your back to sitting on the side of a flat bed without using bedrails?: A Lot Help needed moving to and from a bed to a chair (including a wheelchair)?: A Lot Help needed standing up from a chair using your arms (e.g., wheelchair or bedside chair)?: A Lot Help needed to walk in hospital room?: Total Help needed climbing 3-5 steps with a railing? : Total 6 Click Score: 10    End of Session Equipment Utilized During Treatment: Gait belt Activity Tolerance: Patient limited by fatigue;Patient limited by pain Patient left: in bed;with call bell/phone within reach;with family/visitor present Nurse Communication: Mobility status PT Visit Diagnosis: Muscle weakness (generalized) (M62.81);Unsteadiness on feet (R26.81)     Time: 1127-1200 PT Time Calculation (min) (ACUTE ONLY): 33 min  Charges:  $Therapeutic Exercise: 8-22 mins $Therapeutic Activity: 8-22 mins                     Kreg Shropshire, DPT 01/25/2018, 12:55 PM

## 2018-01-25 NOTE — Clinical Social Work Note (Signed)
CSW spoke with patient's wife Blayden Conwell [phone]. CSW gave bed offers for SNF to wife. Wife states that she would like to think about it and give her decision tomorrow. CSW will meet with patient and wife tomorrow morning. CSW will continue to follow for discharge planning.   [place], [place]

## 2018-01-25 NOTE — Progress Notes (Signed)
Patient ID: Charles Stanton, male   DOB: [birth year], 63 y.o.   MRN: [id number]  [employer] PROGRESS NOTE  BARTT GONZAGA OBS:[id number] DOB: [birth year] DOA: 01/23/2018 PCP: [name], MD  HPI/Subjective: Patient feels weak.  He and his wife are hoping he can go home if he is strong enough.  Still with tremor with Parkinson's.  No further hallucinations so far.  Objective: Vitals:   01/25/18 0839 01/25/18 1214  BP: 114/82 116/79  Pulse: 82 75  Resp:    Temp: 98.2 F (36.8 C) 97.9 F (36.6 C)  SpO2: 94% 95%    Filed Weights   01/23/18 1417  Weight: 60.8 kg    ROS: Review of Systems  Constitutional: Positive for malaise/fatigue. Negative for chills and fever.  Eyes: Negative for blurred vision.  Respiratory: Negative for cough and shortness of breath.   Cardiovascular: Negative for chest pain.  Gastrointestinal: Negative for abdominal pain, constipation, diarrhea, nausea and vomiting.  Genitourinary: Negative for dysuria.  Musculoskeletal: Negative for joint pain.  Neurological: Positive for dizziness. Negative for headaches.   Exam: Physical Exam  Constitutional: He is oriented to person, place, and time.  HENT:  Nose: No mucosal edema.  Mouth/Throat: No oropharyngeal exudate or posterior oropharyngeal edema.  Eyes: Pupils are equal, round, and reactive to light. Conjunctivae, EOM and lids are normal.  Neck: No JVD present. Carotid bruit is not present. No edema present. No thyroid mass and no thyromegaly present.  Cardiovascular: S1 normal and S2 normal. Exam reveals no gallop.  No murmur heard. Pulses:      Dorsalis pedis pulses are 2+ on the right side and 2+ on the left side.  Respiratory: No respiratory distress. He has no wheezes. He has no rhonchi. He has no rales.  GI: Soft. Bowel sounds are normal. There is no abdominal tenderness.  Musculoskeletal:     Right ankle: He exhibits no swelling.     Left ankle: He exhibits no swelling.  Lymphadenopathy:     He has no cervical adenopathy.  Neurological: He is alert and oriented to person, place, and time. No cranial nerve deficit.  Able to straight leg raise with both legs but a little weaker on the right lower extremity than the left.  Skin: Skin is warm. No rash noted. Nails show no clubbing.  Psychiatric: He has a normal mood and affect.      Data Reviewed: Basic Metabolic Panel: Recent Labs  Lab 01/23/18 1556 01/24/18 0418 01/25/18 0429  NA 131* 135 136  K 4.3 3.2* 3.5  CL 95* 99 104  CO2 26 26 25   GLUCOSE 85 72 78  BUN 13 11 8   CREATININE 0.64 0.60* 0.48*  CALCIUM 8.9 8.7* 8.4*  MG  --   --  1.5*   Liver Function Tests: Recent Labs  Lab 01/23/18 1556  AST 55*  ALT 11  ALKPHOS 123  BILITOT 1.5*  PROT 6.5  ALBUMIN 3.7    Recent Labs  Lab 01/24/18 1025  AMMONIA 14   CBC: Recent Labs  Lab 01/23/18 1423 01/24/18 0418  WBC 7.5 5.0  NEUTROABS 5.0  --   HGB 13.1 11.4*  HCT 40.3 35.1*  MCV 96.2 96.7  PLT 149* 123*   Cardiac Enzymes: Recent Labs  Lab 01/23/18 1423  TROPONINI <0.03     Studies: Dg Chest 2 View  Result Date: 01/23/2018 CLINICAL DATA:  Increasing weakness. EXAM: CHEST - 2 VIEW COMPARISON:  10/04/2017 FINDINGS: Cardiomediastinal silhouette is normal. Mediastinal contours  appear intact. There is no evidence of pleural effusion or pneumothorax. Linear peribronchial thickening in bilateral lower lobes, right greater than left. Previously noted compression deformities of T6 through T9, and T11 through L3 are less well visualized, but grossly stable. Vertebroplasty of T9. Soft tissues are grossly normal. IMPRESSION: Linear peribronchial thickening in bilateral lower lobes, right greater the left, may represent atelectasis versus scarring. Chronic compression fractures of the thoraco lumbar spine. Electronically Signed   By: Fidela Salisbury M.D.   On: 01/23/2018 16:01   Ct Head Wo Contrast  Result Date: 01/23/2018 CLINICAL DATA:  Increased  weakness, 5 falls in the past 5 days, history Parkinson's, hypertension, atrial fibrillation EXAM: CT HEAD WITHOUT CONTRAST TECHNIQUE: Contiguous axial images were obtained from the base of the skull through the vertex without intravenous contrast. Sagittal and coronal MPR images reconstructed from axial data set. COMPARISON:  10/04/2017 FINDINGS: Brain: Generalized atrophy. Normal ventricular morphology. No midline shift or mass effect. Small vessel chronic ischemic changes of deep cerebral white matter. No intracranial hemorrhage, mass lesion, evidence of acute infarction, or extra-axial fluid collection. Vascular: Minimal atherosclerotic calcifications within internal carotid arteries at skull base. No hyperdense vessels. Skull: Intact Sinuses/Orbits: Intraorbital soft tissue planes and paranasal sinuses clear Other: N/A IMPRESSION: Atrophy with small vessel chronic ischemic changes of deep cerebral white matter. No acute intracranial abnormalities. Electronically Signed   By: Lavonia Dana M.D.   On: 01/23/2018 15:45    Scheduled Meds: . baclofen  10 mg Oral Daily  . buPROPion  150 mg Oral Daily  . carbidopa-levodopa  1 tablet Oral QHS  . carbidopa-levodopa  1 tablet Oral QID  . folic acid  1 mg Oral Daily  . gabapentin  200 mg Oral BID  . heparin  5,000 Units Subcutaneous Q8H  . LORazepam  0-4 mg Intravenous Q6H   Followed by  . LORazepam  0-4 mg Intravenous Q12H  . mouth rinse  15 mL Mouth Rinse BID  . metoprolol tartrate  12.5 mg Oral BID  . multivitamin with minerals  1 tablet Oral Daily  . pantoprazole  40 mg Oral BID  . sertraline  50 mg Oral Daily  . traZODone  100 mg Oral QHS  . vitamin B-12  100 mcg Oral Daily   Continuous Infusions: . sodium chloride 50 mL/hr at 01/25/18 1409  . thiamine injection 500 mg (01/25/18 1113)    Assessment/Plan:  1. Alcohol withdrawal.  Hard to tell with his baseline tremor with Parkinson's disease.  We will give high-dose thiamine. CIWA protocol.   Continue to monitor closely.  Ammonia level normal. 2. Parkinson's disease.  Continue current medications of Sinemet 3. Acute encephalopathy.  Patient answers questions appropriately.  At times having some hallucinations.  This can happen with alcohol withdrawal and/or Parkinson's disease. 4. Weakness and falls.  Physical therapy recommended rehab.  Patient hoping to go home.  Today still too weak to make a decision home. 5. Slight orthostatic hypotension.  Dropped down metoprolol dose and hold lisinopril.  Continue IV fluids. 6. Depression on Zoloft and Wellbutrin and trazodone 7. Hypomagnesemia and hypokalemia replace potassium orally and magnesium IV  Code Status:     Code Status Orders  (From admission, onward)         Start     Ordered   01/23/18 2325  Full code  Continuous     01/23/18 2324        Code Status History    Date Active Date Inactive Code Status  Order ID Comments User Context   12/09/2017 1727 12/12/2017 1813 Full Code [id number]  Gorden Harms, MD Inpatient   09/20/2016 0944 09/22/2016 1726 Full Code [id number]  Hillary Bow, MD ED   03/15/2016 1409 03/15/2016 1748 Full Code [id number]  Hessie Knows, MD Inpatient   01/28/2016 0123 01/31/2016 1619 Full Code [id number]  Lance Coon, MD Inpatient     Family Communication: Wife at the bedside Disposition Plan: To be determined based on clinical course  Time spent: 27 minutes  [place]

## 2018-01-25 NOTE — NC FL2 (Signed)
[place] LEVEL OF CARE SCREENING TOOL     IDENTIFICATION  Patient Name: Charles Stanton Birthdate: 1955-08-21 Sex: male Admission Date (Current Location): 01/23/2018  [place] and [place] Number:  Engineering geologist and Address:  [facility], [address], [place], [place] [address]      Provider Number: [id number]  Attending Physician Name and Address:  Loletha Grayer, MD  Relative Name and Phone Number:       Current Level of Care: Hospital Recommended Level of Care: [place] Prior Approval Number:    Date Approved/Denied:   PASRR Number: [id number]  Discharge Plan: SNF    Current Diagnoses: Patient Active Problem List   Diagnosis Date Noted  . Alcohol dependence with uncomplicated withdrawal ([place]) 01/23/2018  . Alcohol withdrawal ([place]) 01/23/2018  . GIB (gastrointestinal bleeding) 12/09/2017  . A-fib ([place]) 12/09/2017  . Esophageal obstruction 10/10/2016  . Esophageal ulceration 10/10/2016  . GI bleed 09/20/2016  . Low vitamin B12 level 03/18/2016  . S/P kyphoplasty 03/18/2016  . Basal cell carcinoma 02/08/2016  . Atrial fibrillation ([place]) 01/27/2016  . Colitis 01/27/2016  . HLD (hyperlipidemia) 01/27/2016  . Anxiety 01/27/2016  . Hypokalemia 01/27/2016  . Major depressive disorder, recurrent episode, moderate ([place]) 04/14/2015  . Anxiety, generalized 12/06/2013  . Parkinson disease ([place]) 08/07/2013  . Dysphagia 05/12/2013  . Esophageal mass 05/12/2013  . GERD (gastroesophageal reflux disease) 05/12/2013  . Vomiting 05/12/2013  . Open bite of lower leg 05/10/2013  . Dog bite of lower leg 05/10/2013  . Abnormal finding on liver function 03/25/2013  . Benign neoplasm 03/25/2013  . Dizziness 03/25/2013  . Type 2 diabetes mellitus ([place]) 03/25/2013  . BP (high blood pressure) 03/25/2013  . Awareness of heartbeats 03/25/2013  . Pure hypercholesterolemia 03/25/2013  . Infective tonsillitis 03/25/2013   . Adenomatous polyp 03/25/2013    Orientation RESPIRATION BLADDER Height & Weight     Self, Time, Place, Situation  Normal Incontinent Weight: 134 lb (60.8 kg) Height:  5\' 4"  (162.6 cm)  BEHAVIORAL SYMPTOMS/MOOD NEUROLOGICAL BOWEL NUTRITION STATUS  (none) (none) Continent Diet(Regular )  AMBULATORY STATUS COMMUNICATION OF NEEDS Skin   Extensive Assist Verbally Normal                       Personal Care Assistance Level of Assistance  Bathing, Feeding, Dressing Bathing Assistance: Limited assistance Feeding assistance: Independent Dressing Assistance: Limited assistance     Functional Limitations Info  Hearing, Sight, Speech Sight Info: Adequate Hearing Info: Adequate Speech Info: Adequate    SPECIAL CARE FACTORS FREQUENCY  PT (By licensed PT), OT (By licensed OT)     PT Frequency: 5 OT Frequency: 5            Contractures Contractures Info: Not present    Additional Factors Info  Code Status, Allergies Code Status Info: Full Code  Allergies Info: Amantadine           Current Medications (01/25/2018):  This is the current hospital active medication list Current Facility-Administered Medications  Medication Dose Route Frequency Provider Last Rate Last Dose  . 0.9 %  sodium chloride infusion   Intravenous Continuous Loletha Grayer, MD 50 mL/hr at 01/24/18 1419    . baclofen (LIORESAL) tablet 10 mg  10 mg Oral Daily Vaughan Basta, MD   10 mg at 01/24/18 1120  . buPROPion (WELLBUTRIN XL) 24 hr tablet 150 mg  150 mg Oral Daily Vaughan Basta, MD   150  mg at 01/24/18 1120  . carbidopa-levodopa (SINEMET CR) 50-200 MG per tablet controlled release 1 tablet  1 tablet Oral QHS Vaughan Basta, MD   1 tablet at 01/24/18 2200  . carbidopa-levodopa (SINEMET IR) 25-100 MG per tablet immediate release 1 tablet  1 tablet Oral QID Vaughan Basta, MD   1 tablet at 01/24/18 2201  . docusate sodium (COLACE) capsule 100 mg  100 mg Oral BID  PRN Vaughan Basta, MD      . folic acid (FOLVITE) tablet 1 mg  1 mg Oral Daily Vaughan Basta, MD   1 mg at 01/24/18 1119  . gabapentin (NEURONTIN) capsule 200 mg  200 mg Oral BID Vaughan Basta, MD   200 mg at 01/24/18 1120  . heparin injection 5,000 Units  5,000 Units Subcutaneous Q8H Vaughan Basta, MD   5,000 Units at 01/25/18 [phone]  . LORazepam (ATIVAN) injection 0-4 mg  0-4 mg Intravenous Q6H Lance Coon, MD   2 mg at 01/25/18 0401   Followed by  . LORazepam (ATIVAN) injection 0-4 mg  0-4 mg Intravenous Q12H Lance Coon, MD      . LORazepam (ATIVAN) tablet 2 mg  2 mg Oral Q4H PRN Vaughan Basta, MD       Or  . LORazepam (ATIVAN) injection 2 mg  2 mg Intravenous Q4H PRN Vaughan Basta, MD   2 mg at 01/23/18 2142  . magnesium sulfate IVPB 2 g 50 mL  2 g Intravenous Once Loletha Grayer, MD      . MEDLINE mouth rinse  15 mL Mouth Rinse BID Loletha Grayer, MD   15 mL at 01/25/18 0409  . metoprolol tartrate (LOPRESSOR) tablet 12.5 mg  12.5 mg Oral BID Loletha Grayer, MD      . multivitamin with minerals tablet 1 tablet  1 tablet Oral Daily Vaughan Basta, MD   1 tablet at 01/24/18 1120  . nitroGLYCERIN (NITROSTAT) SL tablet 0.4 mg  0.4 mg Sublingual Q5 min PRN Harrie Foreman, MD      . pantoprazole (PROTONIX) EC tablet 40 mg  40 mg Oral BID Vaughan Basta, MD   40 mg at 01/24/18 2200  . potassium chloride SA (K-DUR,KLOR-CON) CR tablet 40 mEq  40 mEq Oral Once Loletha Grayer, MD      . sertraline (ZOLOFT) tablet 50 mg  50 mg Oral Daily Vaughan Basta, MD   50 mg at 01/24/18 1120  . thiamine 500mg  in normal saline (23ml) IVPB  500 mg Intravenous Daily Loletha Grayer, MD   Stopped at 01/24/18 1211  . traZODone (DESYREL) tablet 100 mg  100 mg Oral QHS Vaughan Basta, MD   100 mg at 01/24/18 2155  . vitamin B-12 (CYANOCOBALAMIN) tablet 100 mcg  100 mcg Oral Daily Vaughan Basta, MD   100 mcg at  01/24/18 1124     Discharge Medications: Please see discharge summary for a list of discharge medications.  Relevant Imaging Results:  Relevant Lab Results:   Additional Information SSN: [id number]  Annamaria Boots, [place]

## 2018-01-25 NOTE — Clinical Social Work Note (Signed)
Clinical Social Work Assessment  Patient Details  Name: Charles Stanton MRN: [id number] Date of Birth: [birth year]  Date of referral:  01/25/18               Reason for consult:  Facility Placement                Permission sought to share information with:  Case Manager, Customer service manager, Family Supports Permission granted to share information::  Yes, Verbal Permission Granted  Name::      SNF  Agency::   [facility]   Relationship::     Contact Information:     Housing/Transportation Living arrangements for the past 2 months:  [place] of Information:  Patient Patient Interpreter Needed:  None Criminal Activity/Legal Involvement Pertinent to Current Situation/Hospitalization:  No - Comment as needed Significant Relationships:  Spouse Lives with:  Spouse Do you feel safe going back to the place where you live?  Yes Need for family participation in patient care:  Yes (Comment)  Care giving concerns:  Patient lives with wife in [place]    Social Worker assessment / plan:  CSW consulted for SNF placement. CSW met with patient to discuss discharge plan. Patient reports that he lives with his wife and would like to return home. CSW explained PT recommendation of SNF. Patient states that he would prefer to go home but wants to discuss with his wife before making a decision. Patient is agreeable to bed search. CSW started bed search and will give offers once received. CSW will follow for discharge planning.   Employment status:  Unemployed Forensic scientist:  Medicare PT Recommendations:  [place] / Referral to community resources:  [place]  Patient/Family's Response to care:  Patient thanked CSW for assistance.   Patient/Family's Understanding of and Emotional Response to Diagnosis, Current Treatment, and Prognosis:  Patient is understanding of current diagnoses and treatment.   Emotional  Assessment Appearance:  Appears stated age Attitude/Demeanor/Rapport:    Affect (typically observed):  Accepting, Quiet Orientation:  Oriented to Self, Oriented to Place, Oriented to  Time, Oriented to Situation Alcohol / Substance use:  Not Applicable Psych involvement (Current and /or in the community):  No (Comment)  Discharge Needs  Concerns to be addressed:  Discharge Planning Concerns Readmission within the last 30 days:  No Current discharge risk:  Dependent with Mobility Barriers to Discharge:  Continued Medical Work up   [employer], [place] 01/25/2018, 9:48 AM

## 2018-01-25 NOTE — Evaluation (Signed)
Clinical/Bedside Swallow Evaluation Patient Details  Name: Charles Stanton MRN: [id number] Date of Birth: 02-12-1955  Today's Date: 01/25/2018 Time: SLP Start Time (ACUTE ONLY): 0915 SLP Stop Time (ACUTE ONLY): 1000 SLP Time Calculation (min) (ACUTE ONLY): 45 min  Past Medical History:  Past Medical History:  Diagnosis Date  . Abnormal liver function   . Anemia   . Anxiety   . Atrial fibrillation ([place])   . Colonic mass   . Depression   . Dizziness   . Dry cough   . History of colon polyps   . Hypercholesteremia   . Hyperlipemia   . Hypertension   . Palpitations   . Parkinson's disease ([place])   . Parkinson's disease ([place])   . PVD (peripheral vascular disease) ([place])   . Tremors of nervous system    Past Surgical History:  Past Surgical History:  Procedure Laterality Date  . BACK SURGERY     Kyphoplasty   March 2018  . COLONOSCOPY    . COLONOSCOPY WITH PROPOFOL N/A 12/12/2017   Procedure: COLONOSCOPY WITH PROPOFOL;  Surgeon: Jonathon Bellows, MD;  Location: [facility] ENDOSCOPY;  Service: Gastroenterology;  Laterality: N/A;  . ESOPHAGOGASTRODUODENOSCOPY N/A 12/11/2017   Procedure: ESOPHAGOGASTRODUODENOSCOPY (EGD);  Surgeon: Jonathon Bellows, MD;  Location: [facility] ENDOSCOPY;  Service: Gastroenterology;  Laterality: N/A;  . ESOPHAGOGASTRODUODENOSCOPY (EGD) WITH PROPOFOL N/A 09/21/2016   Procedure: ESOPHAGOGASTRODUODENOSCOPY (EGD) WITH PROPOFOL;  Surgeon: Jonathon Bellows, MD;  Location: [facility] ENDOSCOPY;  Service: Gastroenterology;  Laterality: N/A;  . ESOPHAGOGASTRODUODENOSCOPY (EGD) WITH PROPOFOL N/A 11/07/2016   Procedure: ESOPHAGOGASTRODUODENOSCOPY (EGD) WITH PROPOFOL;  Surgeon: Jonathon Bellows, MD;  Location: [facility] ENDOSCOPY;  Service: Gastroenterology;  Laterality: N/A;  . HERNIA REPAIR     left inguinal hernia repair as an infant  . KYPHOPLASTY N/A 02/11/2016   Procedure: KYPHOPLASTY  L2,T9;  Surgeon: Hessie Knows, MD;  Location: [facility] ORS;  Service: Orthopedics;  Laterality: N/A;  . KYPHOPLASTY N/A  03/15/2016   Procedure: KYPHOPLASTY L3;  Surgeon: Hessie Knows, MD;  Location: [facility] ORS;  Service: Orthopedics;  Laterality: N/A;  . skin cancer removed    . TONSILLECTOMY     HPI:  Per admitting H&P: Charles Stanton  is a 63 y.o. male with a known history of abnormal liver function, anemia, anxiety, atrial fibrillation, depression, dizziness, hypercholesterolemia, hyperlipidemia, hypertension, Parkinson's disease, peripheral vascular disease-drinks alcohol every day.  He has some shaking because of his Parkinson's disease but normally he does not fall down.  He has some physician appointment and lab work coming up so he stopped drinking alcohol for last 2 days.  He had one fall yesterday and today when wife returned from work she found that he had a fall 2-3 times and was very weak and excessively shaking so she decided to bring him to the emergency room   Assessment / Plan / Recommendation Clinical Impression  Pt appeared to present w/ functional swallowing abilities at bedside. No overt s/s of aspiration observed w/ any consistency tested. Oral phase characterized by functional rotary chewing, adequate lingual sweeping/oral clearing, and mild oral transit delay. Oral mechanism exam revealed gross, reduced motor strength and mild-moderate decreased ROM of all structures, due to progression of Parkinson's disease. Initiation of swallow appeared timely and vocal quality remained clear throughout evaluation. No apparent overt s/s of pharyngeal dysphagia were noted. Pt reported having received prior ST services for voice treatment, secondary to Parkinson's disease approx. 5 yrs ago. Noted low vocal intensity, gravely vocal quality, imprecise articulation, and ST estimated 50% intelligibility in  conversation. Pt reported struggling w/ self-feeding d/t having to use non-dominant hand. OT and Dietician consults recommended for optimal utensil use and nutrition/hydration during meal times. Recommend Dysphagia III  diet with extra gravies and sauces with thin liquids. Recommend single sips via straw/cup, meds given whole in puree, and strict aspiration precautions to promote safe swallowing. Educated pt and nsg re: diet and safe swallow recommendations who both stated agreement. Recommend ST voice eval and treat at d/c secondary to mod dysarthria. Pt would likely benefit from [facility] or [facility] Parkinson's voice treatment program to improve speech intelligibility and functional communication. SLP to f/u with toleration of diet while inpatient 1-3 days. SLP Visit Diagnosis: Dysphagia, unspecified (R13.10)    Aspiration Risk  Mild aspiration risk;Risk for inadequate nutrition/hydration(d/t motor impairments)    Diet Recommendation   Dysphagia III (MECHANICAL SOFT; cut meats w/ extra gravy, butters, and sauces) w/ THIN liquids; straw okay if used with small, single sips.   Medication Administration: Whole meds with puree    Other  Recommendations Recommended Consults: Other (Comment)(OT consult for optimized self-feeding efficiency) Oral Care Recommendations: Oral care before and after PO   Follow up Recommendations Skilled Nursing facility      Frequency and Duration min 1 x/week  1 week       Prognosis Prognosis for Safe Diet Advancement: Good Barriers to Reach Goals: Severity of deficits      Swallow Study   General Date of Onset: 01/25/18 HPI: Per admitting H&P: Charles Stanton  is a 63 y.o. male with a known history of abnormal liver function, anemia, anxiety, atrial fibrillation, depression, dizziness, hypercholesterolemia, hyperlipidemia, hypertension, Parkinson's disease, peripheral vascular disease-drinks alcohol every day.  He has some shaking because of his Parkinson's disease but normally he does not fall down.  He has some physician appointment and lab work coming up so he stopped drinking alcohol for last 2 days.  He had one fall yesterday and today when wife returned from work she found that  he had a fall 2-3 times and was very weak and excessively shaking so she decided to bring him to the emergency room Type of Study: Bedside Swallow Evaluation Previous Swallow Assessment: none Diet Prior to this Study: Dysphagia 3 (soft);Thin liquids Temperature Spikes Noted: No Respiratory Status: Room air History of Recent Intubation: No Behavior/Cognition: Alert;Cooperative;Lethargic/Drowsy;Distractible;Requires cueing(Pt was awake, but reclined in bed; seemed tired) Oral Cavity Assessment: Within Functional Limits Oral Care Completed by SLP: No Oral Cavity - Dentition: Adequate natural dentition Vision: Functional for self-feeding Self-Feeding Abilities: Needs assist;Needs set up(d/t eating w/ non-dominant hand and decreased motor control) Patient Positioning: Upright in bed Baseline Vocal Quality: Low vocal intensity(gravely) Volitional Cough: Weak Volitional Swallow: Able to elicit    Oral/Motor/Sensory Function Overall Oral Motor/Sensory Function: Generalized oral weakness(mod defecits) Facial ROM: Reduced right;Reduced left Facial Symmetry: Within Functional Limits Facial Strength: Reduced right;Reduced left Facial Sensation: Other (Comment)(not tested) Lingual ROM: Reduced right;Reduced left Lingual Symmetry: Within Functional Limits Lingual Strength: Within Functional Limits;Reduced Lingual Sensation: Other (Comment)(not tested) Velum: Within Functional Limits Mandible: Impaired(weaker)   Ice Chips Ice chips: Not tested   Thin Liquid Thin Liquid: Within functional limits Presentation: Self Fed;Straw Other Comments: (Single and multiple sips tested)    Nectar Thick Nectar Thick Liquid: Not tested   Honey Thick Honey Thick Liquid: Not tested   Puree Puree: Not tested   Solid     Solid: Within functional limits Presentation: Self Fed;Spoon Other Comments: (increased oral transit time; functional)  Emeline General, Graduate Student SLP 01/25/2018,10:15  AM

## 2018-01-26 LAB — CBC
HCT: 37 % — ABNORMAL LOW (ref 39.0–52.0)
HEMOGLOBIN: 11.4 g/dL — AB (ref 13.0–17.0)
MCH: 31.1 pg (ref 26.0–34.0)
MCHC: 30.8 g/dL (ref 30.0–36.0)
MCV: 100.8 fL — ABNORMAL HIGH (ref 80.0–100.0)
Platelets: 135 10*3/uL — ABNORMAL LOW (ref 150–400)
RBC: 3.67 MIL/uL — ABNORMAL LOW (ref 4.22–5.81)
RDW: 14.7 % (ref 11.5–15.5)
WBC: 5.4 10*3/uL (ref 4.0–10.5)
nRBC: 0 % (ref 0.0–0.2)

## 2018-01-26 MED ORDER — VITAMIN B-1 100 MG PO TABS
100.0000 mg | ORAL_TABLET | Freq: Every day | ORAL | Status: DC
  Administered 2018-01-27: 09:00:00 100 mg via ORAL
  Filled 2018-01-26: qty 1

## 2018-01-26 MED ORDER — SODIUM CHLORIDE 0.9 % IV SOLN
INTRAVENOUS | Status: DC | PRN
  Administered 2018-01-26: 1000 mL via INTRAVENOUS

## 2018-01-26 NOTE — Plan of Care (Signed)
  Problem: Health Behavior/Discharge Planning: Goal: Ability to manage health-related needs will improve Outcome: Progressing   Problem: Clinical Measurements: Goal: Ability to maintain clinical measurements within normal limits will improve Outcome: Progressing Goal: Will remain free from infection Outcome: Progressing Goal: Respiratory complications will improve Outcome: Progressing   Problem: Activity: Goal: Risk for activity intolerance will decrease Outcome: Progressing   Problem: Nutrition: Goal: Adequate nutrition will be maintained Outcome: Progressing   Problem: Coping: Goal: Level of anxiety will decrease Outcome: Progressing   Problem: Elimination: Goal: Will not experience complications related to bowel motility Outcome: Progressing   Problem: Pain Managment: Goal: General experience of comfort will improve Outcome: Progressing   Problem: Safety: Goal: Ability to remain free from injury will improve Outcome: Progressing   Problem: Skin Integrity: Goal: Risk for impaired skin integrity will decrease Outcome: Progressing   Problem: Education: Goal: Knowledge of General Education information will improve Description Including pain rating scale, medication(s)/side effects and non-pharmacologic comfort measures Outcome: Progressing

## 2018-01-26 NOTE — Progress Notes (Signed)
Patient ID: Charles Stanton, male   DOB: 03/06/1955, 63 y.o.   MRN: [id number]  [facility] PROGRESS NOTE  JOMAR DENZ WCH:[id number] DOB: 17-Apr-1955 DOA: 01/23/2018 PCP: [name], MD  HPI/Subjective: Patient states that he feels okay.  Slept well last night.  Family concerned about the Ativan.  Objective: Vitals:   01/26/18 0453 01/26/18 0515  BP: 120/71   Pulse: (!) 52 (!) 51  Resp: 20   Temp: 98.5 F (36.9 C)   SpO2: (!) 87% 95%    Filed Weights   01/23/18 1417  Weight: 60.8 kg    ROS: Review of Systems  Constitutional: Negative for chills and fever.  Eyes: Negative for blurred vision.  Respiratory: Negative for cough and shortness of breath.   Cardiovascular: Negative for chest pain.  Gastrointestinal: Negative for abdominal pain, constipation, diarrhea, nausea and vomiting.  Genitourinary: Negative for dysuria.  Musculoskeletal: Negative for joint pain.  Neurological: Positive for tremors. Negative for headaches.   Exam: Physical Exam  HENT:  Nose: No mucosal edema.  Mouth/Throat: No oropharyngeal exudate or posterior oropharyngeal edema.  Eyes: Pupils are equal, round, and reactive to light. Conjunctivae, EOM and lids are normal.  Neck: No JVD present. Carotid bruit is not present. No edema present. No thyroid mass and no thyromegaly present.  Cardiovascular: S1 normal and S2 normal. Bradycardia present. Exam reveals no gallop.  No murmur heard. Pulses:      Dorsalis pedis pulses are 2+ on the right side and 2+ on the left side.  Respiratory: No respiratory distress. He has no wheezes. He has no rhonchi. He has no rales.  GI: Soft. Bowel sounds are normal. There is no abdominal tenderness.  Musculoskeletal:     Right ankle: He exhibits no swelling.     Left ankle: He exhibits no swelling.  Lymphadenopathy:    He has no cervical adenopathy.  Neurological: He is alert. No cranial nerve deficit.  Able to straight leg raise with both legs but a  little weaker on the right lower extremity than the left. Resting tremor.  Skin: Skin is warm. No rash noted. Nails show no clubbing.  Psychiatric: He has a normal mood and affect.      Data Reviewed: Basic Metabolic Panel: Recent Labs  Lab 01/23/18 1556 01/24/18 0418 01/25/18 0429  NA 131* 135 136  K 4.3 3.2* 3.5  CL 95* 99 104  CO2 26 26 25   GLUCOSE 85 72 78  BUN 13 11 8   CREATININE 0.64 0.60* 0.48*  CALCIUM 8.9 8.7* 8.4*  MG  --   --  1.5*   Liver Function Tests: Recent Labs  Lab 01/23/18 1556  AST 55*  ALT 11  ALKPHOS 123  BILITOT 1.5*  PROT 6.5  ALBUMIN 3.7    Recent Labs  Lab 01/24/18 1025  AMMONIA 14   CBC: Recent Labs  Lab 01/23/18 1423 01/24/18 0418 01/26/18 0421  WBC 7.5 5.0 5.4  NEUTROABS 5.0  --   --   HGB 13.1 11.4* 11.4*  HCT 40.3 35.1* 37.0*  MCV 96.2 96.7 100.8*  PLT 149* 123* 135*   Cardiac Enzymes: Recent Labs  Lab 01/23/18 1423  TROPONINI <0.03     Scheduled Meds: . baclofen  10 mg Oral Daily  . buPROPion  150 mg Oral Daily  . carbidopa-levodopa  1 tablet Oral QHS  . carbidopa-levodopa  1 tablet Oral QID  . folic acid  1 mg Oral Daily  . gabapentin  200 mg Oral  BID  . heparin  5,000 Units Subcutaneous Q8H  . mouth rinse  15 mL Mouth Rinse BID  . multivitamin with minerals  1 tablet Oral Daily  . pantoprazole  40 mg Oral BID  . sertraline  50 mg Oral Daily  . traZODone  100 mg Oral QHS  . vitamin B-12  100 mcg Oral Daily   Continuous Infusions: . thiamine injection Stopped (01/25/18 1143)    Assessment/Plan:  1. Alcohol withdrawal.  Hard to tell with his baseline tremor with Parkinson's disease.  We will give high-dose thiamine.  Stop Ativan at this time.  Ammonia level normal. 2. Parkinson's disease.  Continue Sinemet 3. Acute encephalopathy.  Stop Ativan because this may be worsening his mental status.  He is able to answer some questions 4. Weakness and falls.  Physical therapy recommended rehab.   5. Slight  orthostatic hypotension, bradycardia.  Stop metoprolol also.  We will stop IV fluids and hopefully he will eat better. 6. Depression on Zoloft and Wellbutrin and trazodone 7. Hypomagnesemia and hypokalemia.  Recheck electrolytes tomorrow  Code Status:     Code Status Orders  (From admission, onward)         Start     Ordered   01/23/18 2325  Full code  Continuous     01/23/18 2324        Code Status History    Date Active Date Inactive Code Status Order ID Comments User Context   12/09/2017 1727 12/12/2017 1813 Full Code [id number]  Gorden Harms, MD Inpatient   09/20/2016 0944 09/22/2016 1726 Full Code [id number]  Hillary Bow, MD ED   03/15/2016 1409 03/15/2016 1748 Full Code [id number]  Hessie Knows, MD Inpatient   01/28/2016 0123 01/31/2016 1619 Full Code [id number]  Lance Coon, MD Inpatient     Family Communication: Wife and son at the bedside Disposition Plan: To be determined based on clinical course  Time spent: 28 minutes  [place]

## 2018-01-26 NOTE — Care Management Important Message (Signed)
Important Message  Patient Details  Name: Charles Stanton MRN: [id number] Date of Birth: 25-May-1955   Medicare Important Message Given:  Yes    [name] 01/26/2018, 12:18 PM

## 2018-01-26 NOTE — Clinical Social Work Note (Signed)
CSW met with patient and wife Charles Stanton at bedside. CSW provided bed offers again. They chose [facility] since [facility] was not available. CSW notified Tina at [facility] of bed acceptance. CSW will continue to follow for discharge planning.   [place], [place]

## 2018-01-27 LAB — BASIC METABOLIC PANEL
Anion gap: 7 (ref 5–15)
BUN: 6 mg/dL — ABNORMAL LOW (ref 8–23)
CO2: 26 mmol/L (ref 22–32)
Calcium: 8.7 mg/dL — ABNORMAL LOW (ref 8.9–10.3)
Chloride: 105 mmol/L (ref 98–111)
Creatinine, Ser: 0.52 mg/dL — ABNORMAL LOW (ref 0.61–1.24)
GFR calc Af Amer: >60 mL/min (ref >60)
GFR calc non Af Amer: >60 mL/min (ref >60)
Glucose, Bld: 83 mg/dL (ref 70–99)
Potassium: 3.8 mmol/L (ref 3.5–5.1)
Sodium: 138 mmol/L (ref 135–145)

## 2018-01-27 LAB — MAGNESIUM: Magnesium: 2.1 mg/dL (ref 1.7–2.4)

## 2018-01-27 MED ORDER — THIAMINE HCL 100 MG PO TABS: 100.0000 mg | ORAL_TABLET | Freq: Every day | ORAL | 0 refills | Status: DC

## 2018-01-27 MED ORDER — LISINOPRIL 20 MG PO TABS: 20.0000 mg | ORAL_TABLET | Freq: Every day | ORAL | Status: DC

## 2018-01-27 MED ORDER — ADULT MULTIVITAMIN W/MINERALS CH: 1.0000 | ORAL_TABLET | Freq: Every day | ORAL | 0 refills | Status: DC

## 2018-01-27 MED ORDER — GABAPENTIN 100 MG PO CAPS: 200.0000 mg | ORAL_CAPSULE | Freq: Two times a day (BID) | ORAL | 0 refills | Status: DC

## 2018-01-27 NOTE — Discharge Summary (Signed)
[place] [facility] [place] NAME: Charles Stanton    MR#:  [id number]  DATE OF BIRTH:  May 17, 1955  DATE OF ADMISSION:  01/23/2018 ADMITTING PHYSICIAN: Vaughan Basta, MD  DATE OF DISCHARGE: 01/27/2018  PRIMARY CARE PHYSICIAN: [name], MD    ADMISSION DIAGNOSIS:  Weakness [R53.1]  DISCHARGE DIAGNOSIS:  Principal Problem:   Alcohol dependence with uncomplicated withdrawal ([place]) Active Problems:   Alcohol withdrawal ([place])   SECONDARY DIAGNOSIS:   Past Medical History:  Diagnosis Date  . Abnormal liver function   . Anemia   . Anxiety   . Atrial fibrillation ([place])   . Colonic mass   . Depression   . Dizziness   . Dry cough   . History of colon polyps   . Hypercholesteremia   . Hyperlipemia   . Hypertension   . Palpitations   . Parkinson's disease ([place])   . Parkinson's disease ([place])   . PVD (peripheral vascular disease) ([place])   . Tremors of nervous system     HOSPITAL COURSE:   1.  Alcohol withdrawal.  Very difficult to figure out on whether he was withdrawing from alcohol or illicit his Parkinson's with his tremor.  Once we stopped the Ativan for alcohol withdrawal he improved quite a bit.  His ammonia level was normal. 2.  Parkinson's disease continue short acting Sinemet during the day and long-acting Sinemet at night 3.  Acute encephalopathy.  Likely from alcohol withdrawal.  We stopped Ativan because it was probably worsening his mental status.  Patient more talkative today and looks a lot better today. 4.  Slight orthostatic hypotension and bradycardia.  I stopped his antihypertensive medications.  We did give him IV fluids during the hospital course.  Blood pressure now trending higher.  I will restart lisinopril at night. 5.  Depression on Zoloft, Wellbutrin and trazodone 6.  Hypomagnesemia and hypokalemia.  Electrolytes better after diet improved and coming off the alcohol. 7.  Weakness and falls.  Physical therapy  recommended rehab.  Patient will be transferred to [facility] today.  I did give the patient high-dose IV thiamine while here in the hospital.  Oral thiamine upon discharge.  DISCHARGE CONDITIONS:   Satisfactory  CONSULTS OBTAINED:  None  DRUG ALLERGIES:   Allergies  Allergen Reactions  . Amantadine Other (See Comments)    fatigue sleepy Other reaction(s): Other (See Comments) fatigue sleepy fatigue sleepy fatigue sleepy    DISCHARGE MEDICATIONS:   Allergies as of 01/27/2018      Reactions   Amantadine Other (See Comments)   fatigue sleepy Other reaction(s): Other (See Comments) fatigue sleepy fatigue sleepy fatigue sleepy      Medication List    STOP taking these medications   furosemide 20 MG tablet Commonly known as:  LASIX   metoprolol tartrate 25 MG tablet Commonly known as:  LOPRESSOR   potassium chloride 20 MEQ/15ML (10%) Soln     TAKE these medications   baclofen 10 MG tablet Commonly known as:  LIORESAL Take 10 mg by mouth daily.   buPROPion 150 MG 24 hr tablet Commonly known as:  WELLBUTRIN XL Take 150 mg by mouth daily.   carbidopa-levodopa 50-200 MG tablet Commonly known as:  SINEMET CR Take 1 tablet by mouth at bedtime.   carbidopa-levodopa 25-100 MG tablet Commonly known as:  SINEMET IR TK 1 T PO four times a day   folic acid 1 MG tablet Commonly known as:  FOLVITE Take 1 tablet (  1 mg total) by mouth daily.   gabapentin 100 MG capsule Commonly known as:  NEURONTIN Take 2 capsules (200 mg total) by mouth 2 (two) times daily. What changed:    medication strength  See the new instructions.   lisinopril 20 MG tablet Commonly known as:  PRINIVIL,ZESTRIL Take 1 tablet (20 mg total) by mouth at bedtime. What changed:  when to take this   multivitamin with minerals Tabs tablet Take 1 tablet by mouth daily. Start taking on:  January 28, 2018   pantoprazole 40 MG tablet Commonly known as:  PROTONIX Take 1 tablet (40 mg  total) by mouth 2 (two) times daily.   sertraline 50 MG tablet Commonly known as:  ZOLOFT Take 1 tablet (50 mg total) by mouth daily.   thiamine 100 MG tablet Take 1 tablet (100 mg total) by mouth daily. Start taking on:  January 28, 2018   traZODone 100 MG tablet Commonly known as:  DESYREL Take 100 mg by mouth at bedtime.   vitamin B-12 100 MCG tablet Commonly known as:  CYANOCOBALAMIN Take 100 mcg by mouth daily.        DISCHARGE INSTRUCTIONS:   Follow-up Dr. at rehab 1 day  If you experience worsening of your admission symptoms, develop shortness of breath, life threatening emergency, suicidal or homicidal thoughts you must seek medical attention immediately by calling 911 or calling your MD immediately  if symptoms less severe.  You Must read complete instructions/literature along with all the possible adverse reactions/side effects for all the Medicines you take and that have been prescribed to you. Take any new Medicines after you have completely understood and accept all the possible adverse reactions/side effects.   Please note  You were cared for by a hospitalist during your hospital stay. If you have any questions about your discharge medications or the care you received while you were in the hospital after you are discharged, you can call the unit and asked to speak with the hospitalist on call if the hospitalist that took care of you is not available. Once you are discharged, your primary care physician will handle any further medical issues. Please note that NO REFILLS for any discharge medications will be authorized once you are discharged, as it is imperative that you return to your primary care physician (or establish a relationship with a primary care physician if you do not have one) for your aftercare needs so that they can reassess your need for medications and monitor your lab values.    Today   CHIEF COMPLAINT:   Chief Complaint  Patient presents with   . Weakness    HISTORY OF PRESENT ILLNESS:  Charles Stanton  is a 63 y.o. male came in with weakness.   VITAL SIGNS:  Blood pressure (!) 154/98, pulse 61, temperature 98.5 F (36.9 C), temperature source Oral, resp. rate 17, height 5\' 4"  (1.626 m), weight 60.8 kg, SpO2 96 %.    PHYSICAL EXAMINATION:  GENERAL:  63 y.o.-year-old patient lying in the bed with no acute distress.  EYES: Pupils equal, round, reactive to light and accommodation. No scleral icterus. Extraocular muscles intact.  HEENT: Head atraumatic, normocephalic. Oropharynx and nasopharynx clear.  NECK:  Supple, no jugular venous distention. No thyroid enlargement, no tenderness.  LUNGS: Normal breath sounds bilaterally, no wheezing, rales,rhonchi or crepitation. No use of accessory muscles of respiration.  CARDIOVASCULAR: S1, S2 normal. No murmurs, rubs, or gallops.  ABDOMEN: Soft, non-tender, non-distended. Bowel sounds present. No organomegaly or  mass.  EXTREMITIES: No pedal edema, cyanosis, or clubbing.  NEUROLOGIC: Cranial nerves II through XII are intact. Muscle strength 4+/5 in all extremities. Sensation intact.  Resting tremor a little bit better today. PSYCHIATRIC: The patient is answers questions.  SKIN: No obvious rash, lesion, or ulcer.   DATA REVIEW:   CBC Recent Labs  Lab 01/26/18 0421  WBC 5.4  HGB 11.4*  HCT 37.0*  PLT 135*    Chemistries  Recent Labs  Lab 01/23/18 1556  01/27/18 0535  NA 131*   < > 138  K 4.3   < > 3.8  CL 95*   < > 105  CO2 26   < > 26  GLUCOSE 85   < > 83  BUN 13   < > 6*  CREATININE 0.64   < > 0.52*  CALCIUM 8.9   < > 8.7*  MG  --    < > 2.1  AST 55*  --   --   ALT 11  --   --   ALKPHOS 123  --   --   BILITOT 1.5*  --   --    < > = values in this interval not displayed.    Cardiac Enzymes Recent Labs  Lab 01/23/18 1423  TROPONINI <0.03    Microbiology Results  Results for orders placed or performed during the hospital encounter of 12/09/17  MRSA PCR  Screening     Status: None   Collection Time: 12/10/17 12:14 AM  Result Value Ref Range Status   MRSA by PCR NEGATIVE NEGATIVE Final    Comment:        The GeneXpert MRSA Assay (FDA approved for NASAL specimens only), is one component of a comprehensive MRSA colonization surveillance program. It is not intended to diagnose MRSA infection nor to guide or monitor treatment for MRSA infections. Performed at [facility], [address], [place], [place] [address]       Management plans discussed with the patient, family and they are in agreement.  CODE STATUS:     Code Status Orders  (From admission, onward)         Start     Ordered   01/23/18 2325  Full code  Continuous     01/23/18 2324        Code Status History    Date Active Date Inactive Code Status Order ID Comments User Context   12/09/2017 1727 12/12/2017 1813 Full Code [id number]  Gorden Harms, MD Inpatient   09/20/2016 0944 09/22/2016 1726 Full Code [id number]  Hillary Bow, MD ED   03/15/2016 1409 03/15/2016 1748 Full Code [id number]  Hessie Knows, MD Inpatient   01/28/2016 0123 01/31/2016 1619 Full Code [id number]  Lance Coon, MD Inpatient      TOTAL TIME TAKING CARE OF THIS PATIENT: 35 minutes.    Loletha Grayer M.D on 01/27/2018 at 11:31 AM  Between 7am to 6pm - Pager - [phone]  After 6pm go to [email or web address] - password [email or web address]  [facility] Office  [phone]  CC: Primary care physician; [name], MD

## 2018-01-27 NOTE — Progress Notes (Signed)
Physical Therapy Treatment Patient Details Name: Charles Stanton MRN: [id number] DOB: Nov 16, 1955 Today's Date: 01/27/2018    History of Present Illness 63 y/o male here with a week of gradually increasing weakness.  Has had 5 falls the day he came to ED.  h/o Parkinsons, ETOH abuse.    PT Comments    Pt ready to participate in session today in hopes of discharge home.  Min guard for bed mobility to edge of bed.  Able to sit unsupported.  He stood with min guard/assist to stand but close min a once standing for balance.  He was able to march in place with generally unsteady steps but determined to walk.  While he was able to ambulate around nursing unit x 1 with walker he was far from his baseline of walking independently with RW or SPC.  He required very close mina/mod a for gait.  Steps with decreased step height and length with very choppy steps.  Knees flexed with overall poor walker position at times.  Pt is unsafe at this time to walk without skilled assist and remains at an increased fall risk.  After discussion with pt and wife regarding progress, they both agreed that SNF continued to be the best discharge option for them at this time to get pt back to his baseline and to reduce risk of fall at home.  Discussed with MD.   Follow Up Recommendations  SNF     Equipment Recommendations     Recommendations for Other Services       Precautions / Restrictions Precautions Precautions: Fall Restrictions Weight Bearing Restrictions: No    Mobility  Bed Mobility Overal bed mobility: Needs Assistance Bed Mobility: Supine to Sit     Supine to sit: Min guard        Transfers Overall transfer level: Needs assistance Equipment used: Rolling walker (2 wheeled)   Sit to Stand: Min assist;Mod assist         General transfer comment: raises with less assist today but balance and tremors are primary barrier  Ambulation/Gait Ambulation/Gait assistance: Min assist;Mod assist Gait  Distance (Feet): 200 Feet Assistive device: Rolling walker (2 wheeled) Gait Pattern/deviations: Step-through pattern;Decreased step length - right;Decreased step length - left;Wide base of support     General Gait Details: Very unsteady with min/mod a x 1 to maintain balance and safety   Stairs             Wheelchair Mobility    Modified Rankin (Stroke Patients Only)       Balance Overall balance assessment: Needs assistance Sitting-balance support: Bilateral upper extremity supported Sitting balance-Leahy Scale: Fair     Standing balance support: Bilateral upper extremity supported Standing balance-Leahy Scale: Poor                              Cognition Arousal/Alertness: Awake/alert Behavior During Therapy: WFL for tasks assessed/performed;Anxious Overall Cognitive Status: Within Functional Limits for tasks assessed                                        Exercises      General Comments        Pertinent Vitals/Pain Pain Assessment: No/denies pain    Home Living  Prior Function            PT Goals (current goals can now be found in the care plan section) Progress towards PT goals: Progressing toward goals    Frequency    Min 2X/week      PT Plan Current plan remains appropriate    Co-evaluation              AM-PAC PT "6 Clicks" Mobility   Outcome Measure  Help needed turning from your back to your side while in a flat bed without using bedrails?: A Little Help needed moving from lying on your back to sitting on the side of a flat bed without using bedrails?: A Little Help needed moving to and from a bed to a chair (including a wheelchair)?: A Little Help needed standing up from a chair using your arms (e.g., wheelchair or bedside chair)?: A Little Help needed to walk in hospital room?: A Lot Help needed climbing 3-5 steps with a railing? : Total 6 Click Score: 15    End  of Session Equipment Utilized During Treatment: Gait belt Activity Tolerance: Patient tolerated treatment well Patient left: in chair;with call bell/phone within reach;with chair alarm set;with family/visitor present         Time: 2440-1027 PT Time Calculation (min) (ACUTE ONLY): 11 min  Charges:  $Gait Training: 8-22 mins                     Chesley Noon, PTA 01/27/18, 11:31 AM

## 2018-01-27 NOTE — Clinical Social Work Note (Signed)
The patient will discharge today to [facility] via non-emergent EMS. The patient's family and the facility are aware. The CSW has sent all documentation to the facility and delivered the discharge packet. The facility has asked that the patient be sent @4PM  due to need to ready a room; CSW has advised RN of this. CSW is signing off. Please consult should needs arise.  Santiago Bumpers, MSW, Latanya Presser [phone]

## 2018-01-27 NOTE — Progress Notes (Signed)
3 old abrasions on rt outer arm. Cleansed covered with Mepitel and foam

## 2018-01-31 ENCOUNTER — Telehealth: Payer: Self-pay | Admitting: Gastroenterology

## 2018-01-31 NOTE — Telephone Encounter (Signed)
Contacted Trish in Endoscopy to inform her of EGD cancellation for 02/01/18 due to patient bing in rehab.  Thanks [employer]

## 2018-01-31 NOTE — Telephone Encounter (Signed)
Christi with [facility] called to cancel patient's procedure with Dr Vicente Males per patient's wife for 02-02-18. (I spoke to [place] on 01-30-18 unable to document due to system being down.) They will r/s once patient is out of rehab

## 2018-02-01 ENCOUNTER — Encounter: Admission: RE | Payer: Self-pay | Source: Home / Self Care

## 2018-02-01 ENCOUNTER — Ambulatory Visit: Admission: RE | Admit: 2018-02-01 | Payer: Medicare Other | Source: Home / Self Care | Admitting: Gastroenterology

## 2018-02-01 SURGERY — ESOPHAGOGASTRODUODENOSCOPY (EGD) WITH PROPOFOL
Anesthesia: General

## 2018-03-15 IMAGING — MR MR ABDOMEN WO/W CM MRCP
8 of 20 series · 14 of 48 positions shown · IV contrast (multihance)
Comparison: CT on 07/20/2016

CLINICAL DATA: Anorexia and nausea with vomiting for 1 year.
Elevated liver function tests.

EXAM:
MRI ABDOMEN WITHOUT AND WITH CONTRAST (INCLUDING MRCP)
TECHNIQUE: Multiplanar multisequence MR imaging of the abdomen was performed
both before and after the administration of intravenous contrast.
Heavily T2-weighted images of the biliary and pancreatic ducts were
obtained, and three-dimensional MRCP images were rendered by post
processing.
CONTRAST:  12mL MULTIHANCE GADOBENATE DIMEGLUMINE 529 MG/ML IV SOLN

[Series 2: cor true fisp · coronal · 5.0mm · 0.74mm/px · 1 of 38 slices shown]
[im 1/38]
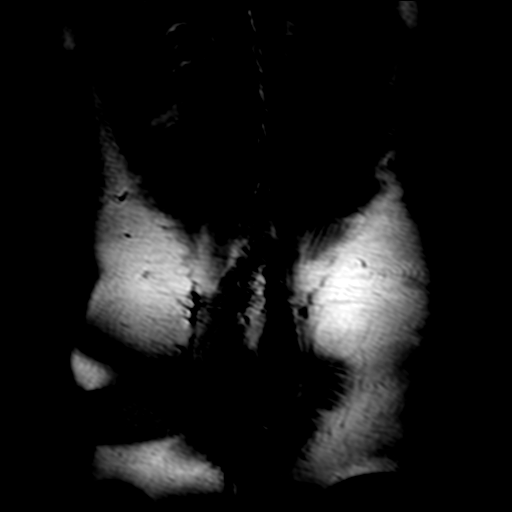

[Series 3: T2 fat-sat · axial · 7.0mm · 0.74mm/px · 1 of 28 slices shown]
[im 1/28]
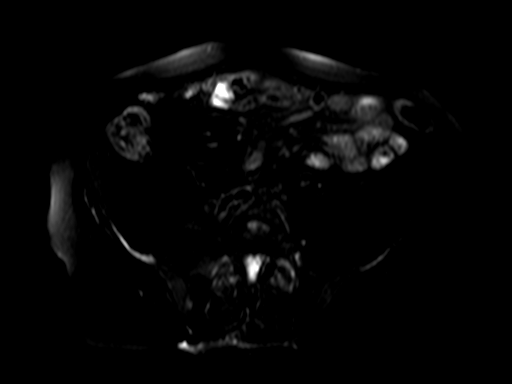

[Series 4: T2 · axial · 7.0mm · 1.48mm/px · 1 of 28 slices shown]
[im 1/28]
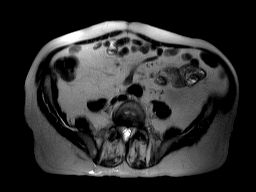

[Series 5: axial in-out of · axial · 7.0mm · 0.74mm/px · z∈[-73,+154]mm · 2 of 56 slices shown]
[im 1/56]
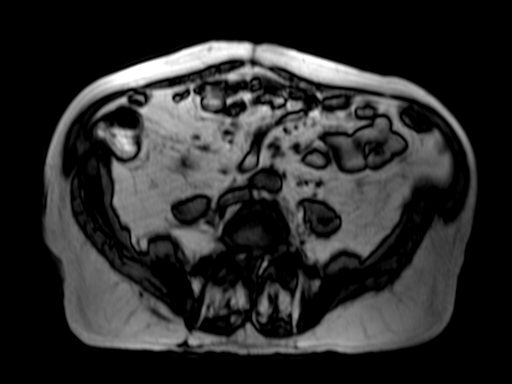
[im 56/56]
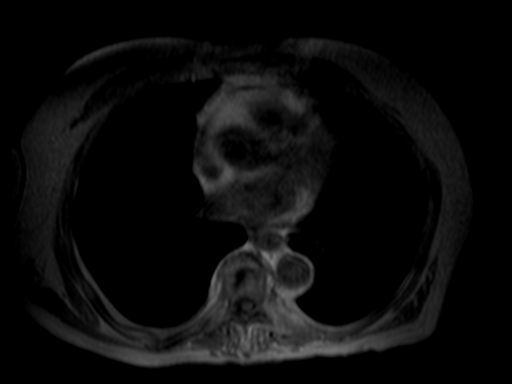

[Series 9: cor thins · coronal · 3.0mm · 0.74mm/px · 1 of 25 slices shown]
[im 1/25]
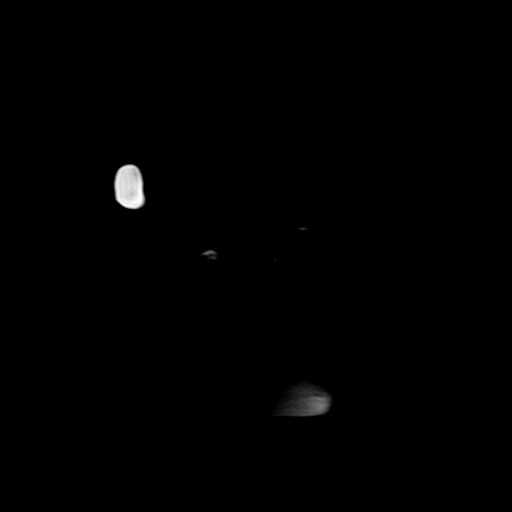

[Series 10: MRCP · sagittal · 40.0mm · 0.91mm/px · 1 of 6 slices shown]
[im 1/6]
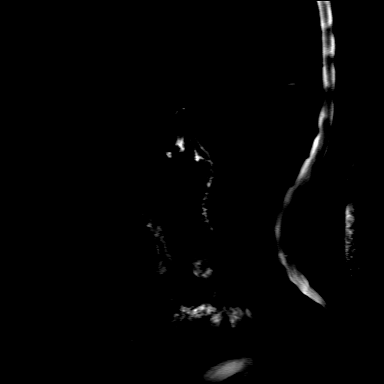

[Series 13: DWI · axial · 6.0mm · 2.97mm/px · z∈[-57,+166]mm · 5 of 96 slices shown]
[im 1/96]
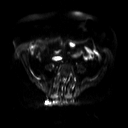
[im 24/96]
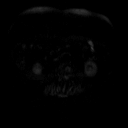
[im 48/96]
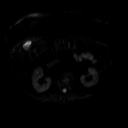
[im 72/96]
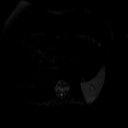
[im 96/96]
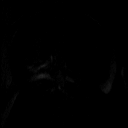

[Series 14: axial dwi_adc · axial · 6.0mm · 2.97mm/px · z∈[-57,+166]mm · 2 of 32 slices shown]
[im 1/32]
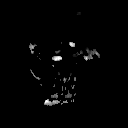
[im 32/32]
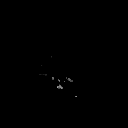

[14 of 48 positions shown; findings below may reference images not displayed]

FINDINGS: Lower chest: No acute findings.

Hepatobiliary: No hepatic masses identified. Diffuse hepatic
steatosis. No definite gross morphologic changes of cirrhosis.
Gallbladder is unremarkable. No evidence of biliary ductal
dilatation or choledocholithiasis.

Pancreas: No mass or inflammatory changes. No evidence of pancreatic
ductal dilatation or pancreas divisum.

Spleen:  Within normal limits in size and appearance.

Adrenals/Urinary Tract: No masses identified. No evidence of
hydronephrosis.

Stomach/Bowel: Moderate size hiatal hernia.  Otherwise unremarkable.

Vascular/Lymphatic: No pathologically enlarged lymph nodes
identified. No abdominal aortic aneurysm.

Other:  None.

Musculoskeletal:  No suspicious bone lesions identified.
IMPRESSION: Diffuse hepatic steatosis. No evidence of hepatic neoplasm or
biliary ductal dilatation.

Moderate hiatal hernia.

## 2019-03-29 ENCOUNTER — Ambulatory Visit: Payer: Medicare Other | Attending: Internal Medicine

## 2019-03-29 DIAGNOSIS — Z23 Encounter for immunization: Secondary | ICD-10-CM

## 2019-03-29 NOTE — Progress Notes (Signed)
   Covid-19 Vaccination Clinic  Name:  ABRIEN SARKISYAN    MRN: [id number] DOB: [birth year]  03/29/2019  Mr. Burges was observed post Covid-19 immunization for 15 minutes without incident. He was provided with Vaccine Information Sheet and instruction to access the V-Safe system.   Mr. Basom was instructed to call 911 with any severe reactions post vaccine: Marland Kitchen Difficulty breathing  . Swelling of face and throat  . A fast heartbeat  . A bad rash all over body  . Dizziness and weakness   Immunizations Administered    Name Date Dose VIS Date Route   Pfizer COVID-19 Vaccine 03/29/2019  1:35 PM 0.3 mL 12/14/2018 Intramuscular   Manufacturer: [place]   Lot: [id number]   [place]: [id number]

## 2019-04-19 ENCOUNTER — Ambulatory Visit: Payer: Medicare Other | Attending: Internal Medicine

## 2019-04-19 DIAGNOSIS — Z23 Encounter for immunization: Secondary | ICD-10-CM

## 2019-04-19 NOTE — Progress Notes (Signed)
   Covid-19 Vaccination Clinic  Name:  Charles Stanton    MRN: [id number] DOB: 1955/02/27  04/19/2019  Mr. Charles Stanton was observed post Covid-19 immunization for 15 minutes without incident. He was provided with Vaccine Information Sheet and instruction to access the V-Safe system.   Mr. Charles Stanton was instructed to call 911 with any severe reactions post vaccine: Marland Kitchen Difficulty breathing  . Swelling of face and throat  . A fast heartbeat  . A bad rash all over body  . Dizziness and weakness   Immunizations Administered    Name Date Dose VIS Date Route   Pfizer COVID-19 Vaccine 04/19/2019  1:00 AM 0.3 mL 12/14/2018 Intramuscular   Manufacturer: [place]   Lot: [id number]   [place]: [id number]

## 2019-05-27 ENCOUNTER — Other Ambulatory Visit: Payer: Self-pay | Admitting: Orthopedic Surgery

## 2019-05-27 ENCOUNTER — Encounter: Payer: Self-pay | Admitting: Emergency Medicine

## 2019-05-27 ENCOUNTER — Other Ambulatory Visit: Payer: Self-pay

## 2019-05-27 ENCOUNTER — Emergency Department: Payer: Medicare Other

## 2019-05-27 ENCOUNTER — Emergency Department
Admission: EM | Admit: 2019-05-27 | Discharge: 2019-05-27 | Disposition: A | Discharge status: Home / Self Care | Payer: Medicare Other | Source: Home / Self Care | Attending: Emergency Medicine | Admitting: Emergency Medicine

## 2019-05-27 DIAGNOSIS — R52 Pain, unspecified: Secondary | ICD-10-CM

## 2019-05-27 DIAGNOSIS — S52571A Other intraarticular fracture of lower end of right radius, initial encounter for closed fracture: Secondary | ICD-10-CM | POA: Diagnosis not present

## 2019-05-27 DIAGNOSIS — S52501A Unspecified fracture of the lower end of right radius, initial encounter for closed fracture: Secondary | ICD-10-CM

## 2019-05-27 LAB — COMPREHENSIVE METABOLIC PANEL
ALT: 23 U/L (ref 0–44)
AST: 54 U/L — ABNORMAL HIGH (ref 15–41)
Albumin: 3.5 g/dL (ref 3.5–5.0)
Alkaline Phosphatase: 146 U/L — ABNORMAL HIGH (ref 38–126)
Anion gap: 14 (ref 5–15)
BUN: 5 mg/dL — ABNORMAL LOW (ref 8–23)
CO2: 22 mmol/L (ref 22–32)
Calcium: 8.6 mg/dL — ABNORMAL LOW (ref 8.9–10.3)
Chloride: 101 mmol/L (ref 98–111)
Creatinine, Ser: 0.44 mg/dL — ABNORMAL LOW (ref 0.61–1.24)
GFR calc Af Amer: >60 mL/min (ref >60)
GFR calc non Af Amer: >60 mL/min (ref >60)
Glucose, Bld: 87 mg/dL (ref 70–99)
Potassium: 4.1 mmol/L (ref 3.5–5.1)
Sodium: 137 mmol/L (ref 135–145)
Total Bilirubin: 0.8 mg/dL (ref 0.3–1.2)
Total Protein: 6.4 g/dL — ABNORMAL LOW (ref 6.5–8.1)

## 2019-05-27 LAB — CBC WITH DIFFERENTIAL/PLATELET
Abs Immature Granulocytes: 0.09 10*3/uL — ABNORMAL HIGH (ref 0.00–0.07)
Basophils Absolute: 0 10*3/uL (ref 0.0–0.1)
Basophils Relative: 0 %
Eosinophils Absolute: 0 10*3/uL (ref 0.0–0.5)
Eosinophils Relative: 0 %
HCT: 40.9 % (ref 39.0–52.0)
Hemoglobin: 13.7 g/dL (ref 13.0–17.0)
Immature Granulocytes: 1 %
Lymphocytes Relative: 10 %
Lymphs Abs: 0.9 10*3/uL (ref 0.7–4.0)
MCH: 30.9 pg (ref 26.0–34.0)
MCHC: 33.5 g/dL (ref 30.0–36.0)
MCV: 92.3 fL (ref 80.0–100.0)
Monocytes Absolute: 0.5 10*3/uL (ref 0.1–1.0)
Monocytes Relative: 5 %
Neutro Abs: 7.5 10*3/uL (ref 1.7–7.7)
Neutrophils Relative %: 84 %
Platelets: 228 10*3/uL (ref 150–400)
RBC: 4.43 MIL/uL (ref 4.22–5.81)
RDW: 14.5 % (ref 11.5–15.5)
WBC: 9 10*3/uL (ref 4.0–10.5)
nRBC: 0 % (ref 0.0–0.2)

## 2019-05-27 LAB — SARS CORONAVIRUS 2 BY RT PCR (HOSPITAL ORDER, PERFORMED IN ~~LOC~~ HOSPITAL LAB): SARS Coronavirus 2: POSITIVE — AB

## 2019-05-27 MED ORDER — MORPHINE SULFATE (PF) 4 MG/ML IV SOLN
4.0000 mg | Freq: Once | INTRAVENOUS | Status: AC
  Administered 2019-05-27: 4 mg via INTRAVENOUS
  Filled 2019-05-27: qty 1

## 2019-05-27 MED ORDER — MORPHINE SULFATE (PF) 4 MG/ML IV SOLN: 4.0000 mg | Freq: Once | INTRAVENOUS | Status: DC

## 2019-05-27 MED ORDER — OXYCODONE-ACETAMINOPHEN 5-325 MG PO TABS
1.0000 | ORAL_TABLET | Freq: Once | ORAL | Status: AC
  Administered 2019-05-27: 1 via ORAL
  Filled 2019-05-27: qty 1

## 2019-05-27 MED ORDER — OXYCODONE-ACETAMINOPHEN 5-325 MG PO TABS: 1.0000 | ORAL_TABLET | ORAL | 0 refills | Status: AC | PRN

## 2019-05-27 NOTE — Discharge Instructions (Addendum)
Follow-up dr Rudene Christians at [facility] orthopedic clinic directly after being discharged.  Keep the arm elevated and apply ice to the wrist to decrease swelling.  Take pain medication as prescribed.

## 2019-05-27 NOTE — ED Triage Notes (Signed)
Presents vis EMS s/p fall  States he fell while going to the bathroom  Positive injury to right wrist  Pos deformity  Good pulses

## 2019-05-27 NOTE — ED Provider Notes (Signed)
[facility] Emergency Department Provider Note  ____________________________________________   First MD Initiated Contact with Patient 05/27/19 1216     (approximate)  I have reviewed the triage vital signs and the nursing notes.   HISTORY  Chief Complaint Fall    HPI Charles Stanton is a 64 y.o. male presents emergency department after a fall.  Patient has history of Parkinson's and states he tripped and fell while going to the bathroom.  Patient used to be right-handed but has tremor so bad in the right hand he uses his left hand more.  He denies any other injuries at this time.  Pain is 10/10    Past Medical History:  Diagnosis Date  . Abnormal liver function   . Anemia   . Anxiety   . Atrial fibrillation ([place])   . Colonic mass   . Depression   . Dizziness   . Dry cough   . History of colon polyps   . Hypercholesteremia   . Hyperlipemia   . Hypertension   . Palpitations   . Parkinson's disease ([place])   . Parkinson's disease ([place])   . PVD (peripheral vascular disease) ([place])   . Tremors of nervous system     Patient Active Problem List   Diagnosis Date Noted  . Alcohol dependence with uncomplicated withdrawal ([place]) 01/23/2018  . Alcohol withdrawal ([place]) 01/23/2018  . GIB (gastrointestinal bleeding) 12/09/2017  . A-fib ([place]) 12/09/2017  . Esophageal obstruction 10/10/2016  . Esophageal ulceration 10/10/2016  . GI bleed 09/20/2016  . Low vitamin B12 level 03/18/2016  . S/P kyphoplasty 03/18/2016  . Basal cell carcinoma 02/08/2016  . Atrial fibrillation ([place]) 01/27/2016  . Colitis 01/27/2016  . HLD (hyperlipidemia) 01/27/2016  . Anxiety 01/27/2016  . Hypokalemia 01/27/2016  . Major depressive disorder, recurrent episode, moderate ([place]) 04/14/2015  . Anxiety, generalized 12/06/2013  . Parkinson disease ([place]) 08/07/2013  . Dysphagia 05/12/2013  . Esophageal mass 05/12/2013  . GERD (gastroesophageal reflux disease) 05/12/2013  .  Vomiting 05/12/2013  . Open bite of lower leg 05/10/2013  . Dog bite of lower leg 05/10/2013  . Abnormal finding on liver function 03/25/2013  . Benign neoplasm 03/25/2013  . Dizziness 03/25/2013  . Type 2 diabetes mellitus ([place]) 03/25/2013  . BP (high blood pressure) 03/25/2013  . Awareness of heartbeats 03/25/2013  . Pure hypercholesterolemia 03/25/2013  . Infective tonsillitis 03/25/2013  . Adenomatous polyp 03/25/2013    Past Surgical History:  Procedure Laterality Date  . BACK SURGERY     Kyphoplasty   March 2018  . COLONOSCOPY    . COLONOSCOPY WITH PROPOFOL N/A 12/12/2017   Procedure: COLONOSCOPY WITH PROPOFOL;  Surgeon: Jonathon Bellows, MD;  Location: [facility] ENDOSCOPY;  Service: Gastroenterology;  Laterality: N/A;  . ESOPHAGOGASTRODUODENOSCOPY N/A 12/11/2017   Procedure: ESOPHAGOGASTRODUODENOSCOPY (EGD);  Surgeon: Jonathon Bellows, MD;  Location[facility] ENDOSCOPY;  Service: Gastroenterology;  Laterality: N/A;  . ESOPHAGOGASTRODUODENOSCOPY (EGD) WITH PROPOFOL N/A 09/21/2016   Procedure: ESOPHAGOGASTRODUODENOSCOPY (EGD) WITH PROPOFOL;  Surgeon: Jonathon Bellows, MD;  Location: [facility] ENDOSCOPY;  Service: Gastroenterology;  Laterality: N/A;  . ESOPHAGOGASTRODUODENOSCOPY (EGD) WITH PROPOFOL N/A 11/07/2016   Procedure: ESOPHAGOGASTRODUODENOSCOPY (EGD) WITH PROPOFOL;  Surgeon: Jonathon Bellows, MD;  Location: [facility] ENDOSCOPY;  Service: Gastroenterology;  Laterality: N/A;  . HERNIA REPAIR     left inguinal hernia repair as an infant  . KYPHOPLASTY N/A 02/11/2016   Procedure: KYPHOPLASTY  L2,T9;  Surgeon: Hessie Knows, MD;  Location: [facility] ORS;  Service: Orthopedics;  Laterality: N/A;  .  KYPHOPLASTY N/A 03/15/2016   Procedure: KYPHOPLASTY L3;  Surgeon: Hessie Knows, MD;  Location: [facility] ORS;  Service: Orthopedics;  Laterality: N/A;  . skin cancer removed    . TONSILLECTOMY      Prior to Admission medications   Medication Sig Start Date End Date Taking? Authorizing Provider  baclofen (LIORESAL) 10 MG tablet Take  10 mg by mouth daily.  10/03/16   [provider]  buPROPion (WELLBUTRIN XL) 150 MG 24 hr tablet Take 150 mg by mouth daily. 12/20/17   [provider]  carbidopa-levodopa (SINEMET CR) 50-200 MG tablet Take 1 tablet by mouth at bedtime.    [provider]  carbidopa-levodopa (SINEMET IR) 25-100 MG tablet TK 1 T PO four times a day 08/16/16   [provider]  folic acid (FOLVITE) 1 MG tablet Take 1 tablet (1 mg total) by mouth daily. 12/13/17   Fritzi Mandes, MD  gabapentin (NEURONTIN) 100 MG capsule Take 2 capsules (200 mg total) by mouth 2 (two) times daily. 01/27/18   Loletha Grayer, MD  lisinopril (PRINIVIL,ZESTRIL) 20 MG tablet Take 1 tablet (20 mg total) by mouth at bedtime. 01/27/18   Loletha Grayer, MD  Multiple Vitamin (MULTIVITAMIN WITH MINERALS) TABS tablet Take 1 tablet by mouth daily. 01/28/18   Loletha Grayer, MD  oxyCODONE-acetaminophen (PERCOCET) 5-325 MG tablet Take 1 tablet by mouth every 4 (four) hours as needed for severe pain. 05/27/19 05/26/20  Fisher, Linden Dolin, PA-C  pantoprazole (PROTONIX) 40 MG tablet Take 1 tablet (40 mg total) by mouth 2 (two) times daily. 12/12/17   Fritzi Mandes, MD  sertraline (ZOLOFT) 50 MG tablet Take 1 tablet (50 mg total) by mouth daily. 08/08/16   Rainey Pines, MD  thiamine 100 MG tablet Take 1 tablet (100 mg total) by mouth daily. 01/28/18   Loletha Grayer, MD  traZODone (DESYREL) 100 MG tablet Take 100 mg by mouth at bedtime. 01/17/18   [provider]  vitamin B-12 (CYANOCOBALAMIN) 100 MCG tablet Take 100 mcg by mouth daily.    [provider]    Allergies Amantadine  Family History  Problem Relation Age of Onset  . Heart Problems Mother   . Dementia Mother     Social History Social History   Tobacco Use  . Smoking status: Never Smoker  . Smokeless tobacco: Never Used  Substance Use Topics  . Alcohol use: Yes    Alcohol/week: 2.0 standard drinks    Types: 2 Cans of beer per week     Comment: Daily 2 beers and shot of whiskey  . Drug use: No    Review of Systems  Constitutional: No fever/chills Eyes: No visual changes. ENT: No sore throat. Respiratory: Denies cough Cardiovascular: Denies chest pain Gastrointestinal: Denies abdominal pain Genitourinary: Negative for dysuria. Musculoskeletal: Negative for back pain.  Positive for right wrist pain Skin: Negative for rash. Psychiatric: no mood changes,     ____________________________________________   PHYSICAL EXAM:  VITAL SIGNS: ED Triage Vitals  Enc Vitals Group     BP 05/27/19 1157 125/63     Pulse Rate 05/27/19 1157 76     Resp 05/27/19 1157 18     Temp 05/27/19 1157 98 F (36.7 C)     Temp Source 05/27/19 1157 Oral     SpO2 05/27/19 1157 94 %     Weight 05/27/19 1153 135 lb (61.2 kg)     Height 05/27/19 1153 5\' 4"  (1.626 m)     Head Circumference --  [facility] --      Pain Score 05/27/19 1153 8     Pain Loc --      Pain Edu? --      Excl. in [place]? --     Constitutional: Alert and oriented. Well appearing and in no acute distress. Eyes: Conjunctivae are normal.  Head: Atraumatic. Nose: No congestion/rhinnorhea. Mouth/Throat: Mucous membranes are moist.   Neck:  supple no lymphadenopathy noted Cardiovascular: Normal rate, regular rhythm.  Respiratory: Normal respiratory effort.  No retractions,  GU: deferred Musculoskeletal: Constant tremor noted of the right arm, large deformity noted of the right wrist, neurovascular is intact  neurologic:  Normal speech and language.  Skin:  Skin is warm, dry and intact. No rash noted. Psychiatric: Mood and affect are normal. Speech and behavior are normal.  ____________________________________________   LABS (all labs ordered are listed, but only abnormal results are displayed)  Labs Reviewed  COMPREHENSIVE METABOLIC PANEL - Abnormal; Notable for the following components:      Result Value   BUN 5 (*)    Creatinine, Ser 0.44 (*)    Calcium  8.6 (*)    Total Protein 6.4 (*)    AST 54 (*)    Alkaline Phosphatase 146 (*)    All other components within normal limits  CBC WITH DIFFERENTIAL/PLATELET - Abnormal; Notable for the following components:   Abs Immature Granulocytes 0.09 (*)    All other components within normal limits  SARS CORONAVIRUS 2 BY RT PCR (HOSPITAL ORDER, [facility])   ____________________________________________   ____________________________________________  RADIOLOGY  X-Armel of the right wrist shows a comminuted angulated displaced fracture of the distal radius and ulna.  ____________________________________________   PROCEDURES  Procedure(s) performed:   .Ortho Injury Treatment  Date/Time: 05/27/2019 2:38 PM Performed by: Lavonia Drafts, MD Authorized by: Versie Starks, PA-C   Consent:    Consent obtained:  Verbal   Consent given by:  Patient   Risks discussed:  Fracture, nerve damage, restricted joint movement, vascular damage and stiffness   Alternatives discussed:  ImmobilizationInjury location: wrist Location details: right wrist Injury type: fracture Fracture type: distal radius Pre-procedure neurovascular assessment: neurovascularly intact Pre-procedure distal perfusion: normal Pre-procedure neurological function: normal Pre-procedure range of motion: normal  Anesthesia: Local anesthesia used: no  Patient sedated: NoManipulation performed: yes Reduction successful: yes X-[place] confirmed reduction: yes Immobilization: splint and sling Splint type: sugar tong Supplies used: cotton padding,  elastic bandage and Ortho-Glass Post-procedure neurovascular assessment: post-procedure neurovascularly intact Post-procedure distal perfusion: normal Post-procedure neurological function: normal Post-procedure range of motion: normal Patient tolerance: patient tolerated the procedure well with no immediate complications        ____________________________________________   INITIAL IMPRESSION / ASSESSMENT AND PLAN / ED COURSE  Pertinent labs & imaging results that were available during my care of the patient were reviewed by me and considered in my medical decision making (see chart for details).   Patient 64 year old male with history of Parkinson's presents emergency department after fall complaint of wrist pain.  Physical exam does show there to be a deformity of the right wrist.  X-Gravelle of the right wrist shows a comminuted displaced and angulated fracture of the distal radius  I did [facility] orth[facility].  Dr. Harlow Mares in surgery and will look at the films and advise.  ----------------------------------------- 2:37 PM on 05/27/2019 -----------------------------------------  Dr. Harlow Mares called and stated to splint trying to reduce the wrist and he was seen in the office and  scheduled for surgery.  Dr. Corky Downs in with me to see the patient.  He was there for the reduction and splint application.  Patient tolerated procedure well.  Be given pain medication at discharge and follow-up with Dr. Harlow Mares pain   Post reduction xray shows minimal improvement but some  Dr Roland Rack called, patient's daughter works at [facility] and has arranged for him to be seen there.  He is to go directly to the orthopedic clinic, we discharged him from here to be set up for surgery tomorrow.  Patient was updated.  They are asking they need to call [facility].  I told them not to worry about it at this time.   Charles Stanton was evaluated in Emergency Department on 05/27/2019 for the symptoms described in the history of present illness. He was evaluated in the context of the global COVID-19 pandemic, which necessitated consideration that the patient might be at risk for infection with the SARS-CoV-2 virus that causes COVID-19. Institutional protocols and algorithms that pertain to the evaluation of patients at risk for COVID-19 are in  a state of rapid change based on information released by regulatory bodies including the CDC and federal and state organizations. These policies and algorithms were followed during the patient's care in the ED.   As part of my medical decision making, I reviewed the following data within the [place] notes reviewed and incorporated, Old chart reviewed, Radiograph reviewed , A consult was requested and obtained from this/these consultant(s) Orthopedics, Evaluated by EM attending kinner, Notes from prior ED visits and [place] Controlled Substance Database  ____________________________________________   FINAL CLINICAL IMPRESSION(S) / ED DIAGNOSES  Final diagnoses:  Closed fracture of distal ends of right radius and ulna, initial encounter      NEW MEDICATIONS STARTED DURING THIS VISIT:  New Prescriptions   OXYCODONE-ACETAMINOPHEN (PERCOCET) 5-325 MG TABLET    Take 1 tablet by mouth every 4 (four) hours as needed for severe pain.     Note:  This document was prepared using Dragon voice recognition software and may include unintentional dictation errors.    Versie Starks, PA-C 05/27/19 1535    Lavonia Drafts, MD 06/01/19 0700

## 2019-05-28 ENCOUNTER — Inpatient Hospital Stay
Admission: EM | Admit: 2019-05-28 | Discharge: 2019-06-01 | DRG: 510 | Disposition: A | Discharge status: Home Health | Payer: Medicare Other | Attending: Internal Medicine | Admitting: Internal Medicine

## 2019-05-28 ENCOUNTER — Telehealth: Payer: Self-pay | Admitting: Unknown Physician Specialty

## 2019-05-28 ENCOUNTER — Ambulatory Visit: Payer: Medicare Other | Admitting: Anesthesiology

## 2019-05-28 ENCOUNTER — Ambulatory Visit: Payer: Medicare Other

## 2019-05-28 ENCOUNTER — Telehealth: Payer: Self-pay | Admitting: Emergency Medicine

## 2019-05-28 ENCOUNTER — Encounter: Payer: Self-pay | Admitting: Orthopedic Surgery

## 2019-05-28 ENCOUNTER — Other Ambulatory Visit: Payer: Self-pay

## 2019-05-28 ENCOUNTER — Encounter
Admission: RE | Disposition: A | Discharge status: Home / Self Care | Payer: Self-pay | Source: Home / Self Care | Attending: Internal Medicine

## 2019-05-28 DIAGNOSIS — S52571A Other intraarticular fracture of lower end of right radius, initial encounter for closed fracture: Principal | ICD-10-CM | POA: Diagnosis present

## 2019-05-28 DIAGNOSIS — Z79899 Other long term (current) drug therapy: Secondary | ICD-10-CM | POA: Diagnosis not present

## 2019-05-28 DIAGNOSIS — Z7289 Other problems related to lifestyle: Secondary | ICD-10-CM | POA: Diagnosis not present

## 2019-05-28 DIAGNOSIS — J9601 Acute respiratory failure with hypoxia: Secondary | ICD-10-CM | POA: Diagnosis not present

## 2019-05-28 DIAGNOSIS — Z9181 History of falling: Secondary | ICD-10-CM | POA: Diagnosis not present

## 2019-05-28 DIAGNOSIS — K219 Gastro-esophageal reflux disease without esophagitis: Secondary | ICD-10-CM | POA: Diagnosis present

## 2019-05-28 DIAGNOSIS — Z7901 Long term (current) use of anticoagulants: Secondary | ICD-10-CM | POA: Diagnosis not present

## 2019-05-28 DIAGNOSIS — I482 Chronic atrial fibrillation, unspecified: Secondary | ICD-10-CM | POA: Diagnosis present

## 2019-05-28 DIAGNOSIS — Z8719 Personal history of other diseases of the digestive system: Secondary | ICD-10-CM

## 2019-05-28 DIAGNOSIS — Z9889 Other specified postprocedural states: Secondary | ICD-10-CM | POA: Diagnosis not present

## 2019-05-28 DIAGNOSIS — E785 Hyperlipidemia, unspecified: Secondary | ICD-10-CM | POA: Diagnosis present

## 2019-05-28 DIAGNOSIS — J1282 Pneumonia due to coronavirus disease 2019: Secondary | ICD-10-CM

## 2019-05-28 DIAGNOSIS — F411 Generalized anxiety disorder: Secondary | ICD-10-CM | POA: Diagnosis present

## 2019-05-28 DIAGNOSIS — E1142 Type 2 diabetes mellitus with diabetic polyneuropathy: Secondary | ICD-10-CM | POA: Diagnosis present

## 2019-05-28 DIAGNOSIS — Z419 Encounter for procedure for purposes other than remedying health state, unspecified: Secondary | ICD-10-CM

## 2019-05-28 DIAGNOSIS — G20 Parkinson's disease: Secondary | ICD-10-CM | POA: Diagnosis present

## 2019-05-28 DIAGNOSIS — Z888 Allergy status to other drugs, medicaments and biological substances status: Secondary | ICD-10-CM

## 2019-05-28 DIAGNOSIS — I10 Essential (primary) hypertension: Secondary | ICD-10-CM | POA: Diagnosis not present

## 2019-05-28 DIAGNOSIS — E860 Dehydration: Secondary | ICD-10-CM | POA: Diagnosis present

## 2019-05-28 DIAGNOSIS — J9600 Acute respiratory failure, unspecified whether with hypoxia or hypercapnia: Secondary | ICD-10-CM | POA: Diagnosis not present

## 2019-05-28 DIAGNOSIS — I119 Hypertensive heart disease without heart failure: Secondary | ICD-10-CM | POA: Diagnosis present

## 2019-05-28 DIAGNOSIS — Z85828 Personal history of other malignant neoplasm of skin: Secondary | ICD-10-CM

## 2019-05-28 DIAGNOSIS — W010XXA Fall on same level from slipping, tripping and stumbling without subsequent striking against object, initial encounter: Secondary | ICD-10-CM | POA: Diagnosis present

## 2019-05-28 DIAGNOSIS — R0902 Hypoxemia: Secondary | ICD-10-CM

## 2019-05-28 DIAGNOSIS — I160 Hypertensive urgency: Secondary | ICD-10-CM | POA: Diagnosis present

## 2019-05-28 DIAGNOSIS — F329 Major depressive disorder, single episode, unspecified: Secondary | ICD-10-CM | POA: Diagnosis present

## 2019-05-28 DIAGNOSIS — Z79891 Long term (current) use of opiate analgesic: Secondary | ICD-10-CM

## 2019-05-28 DIAGNOSIS — U071 COVID-19: Secondary | ICD-10-CM | POA: Diagnosis present

## 2019-05-28 DIAGNOSIS — E1151 Type 2 diabetes mellitus with diabetic peripheral angiopathy without gangrene: Secondary | ICD-10-CM | POA: Diagnosis present

## 2019-05-28 DIAGNOSIS — Z8781 Personal history of (healed) traumatic fracture: Secondary | ICD-10-CM | POA: Diagnosis not present

## 2019-05-28 HISTORY — PX: OPEN REDUCTION INTERNAL FIXATION (ORIF) DISTAL RADIAL FRACTURE: SHX5989

## 2019-05-28 HISTORY — PX: FRACTURE SURGERY: SHX138

## 2019-05-28 LAB — CBC WITH DIFFERENTIAL/PLATELET
Abs Immature Granulocytes: 0.02 10*3/uL (ref 0.00–0.07)
Basophils Absolute: 0 10*3/uL (ref 0.0–0.1)
Basophils Relative: 1 %
Eosinophils Absolute: 0 10*3/uL (ref 0.0–0.5)
Eosinophils Relative: 0 %
HCT: 39.4 % (ref 39.0–52.0)
Hemoglobin: 13.4 g/dL (ref 13.0–17.0)
Immature Granulocytes: 1 %
Lymphocytes Relative: 6 %
Lymphs Abs: 0.2 10*3/uL — ABNORMAL LOW (ref 0.7–4.0)
MCH: 31.7 pg (ref 26.0–34.0)
MCHC: 34 g/dL (ref 30.0–36.0)
MCV: 93.1 fL (ref 80.0–100.0)
Monocytes Absolute: 0.1 10*3/uL (ref 0.1–1.0)
Monocytes Relative: 3 %
Neutro Abs: 3.7 10*3/uL (ref 1.7–7.7)
Neutrophils Relative %: 89 %
Platelets: 155 10*3/uL (ref 150–400)
RBC: 4.23 MIL/uL (ref 4.22–5.81)
RDW: 14.3 % (ref 11.5–15.5)
WBC: 4.1 10*3/uL (ref 4.0–10.5)
nRBC: 0 % (ref 0.0–0.2)

## 2019-05-28 LAB — COMPREHENSIVE METABOLIC PANEL
ALT: 20 U/L (ref 0–44)
AST: 40 U/L (ref 15–41)
Albumin: 3.4 g/dL — ABNORMAL LOW (ref 3.5–5.0)
Alkaline Phosphatase: 125 U/L (ref 38–126)
Anion gap: 13 (ref 5–15)
BUN: 9 mg/dL (ref 8–23)
CO2: 24 mmol/L (ref 22–32)
Calcium: 8.6 mg/dL — ABNORMAL LOW (ref 8.9–10.3)
Chloride: 96 mmol/L — ABNORMAL LOW (ref 98–111)
Creatinine, Ser: 0.57 mg/dL — ABNORMAL LOW (ref 0.61–1.24)
GFR calc Af Amer: >60 mL/min (ref >60)
GFR calc non Af Amer: >60 mL/min (ref >60)
Glucose, Bld: 102 mg/dL — ABNORMAL HIGH (ref 70–99)
Potassium: 3.8 mmol/L (ref 3.5–5.1)
Sodium: 133 mmol/L — ABNORMAL LOW (ref 135–145)
Total Bilirubin: 1.9 mg/dL — ABNORMAL HIGH (ref 0.3–1.2)
Total Protein: 6.2 g/dL — ABNORMAL LOW (ref 6.5–8.1)

## 2019-05-28 LAB — PROTIME-INR
INR: 1.1 (ref 0.8–1.2)
Prothrombin Time: 13.7 seconds (ref 11.4–15.2)

## 2019-05-28 LAB — PROCALCITONIN: Procalcitonin: 6.68 ng/mL

## 2019-05-28 LAB — LACTATE DEHYDROGENASE: LDH: 173 U/L (ref 98–192)

## 2019-05-28 LAB — FERRITIN: Ferritin: 132 ng/mL (ref 24–336)

## 2019-05-28 LAB — ABO/RH: ABO/RH(D): B POS

## 2019-05-28 SURGERY — OPEN REDUCTION INTERNAL FIXATION (ORIF) DISTAL RADIUS FRACTURE
Anesthesia: Regional | Site: Wrist | Laterality: Right

## 2019-05-28 MED ORDER — PANTOPRAZOLE SODIUM 40 MG PO TBEC
40.0000 mg | DELAYED_RELEASE_TABLET | Freq: Two times a day (BID) | ORAL | Status: DC
  Administered 2019-05-28 – 2019-06-01 (×7): 40 mg via ORAL
  Filled 2019-05-28 (×8): qty 1

## 2019-05-28 MED ORDER — SERTRALINE HCL 50 MG PO TABS
50.0000 mg | ORAL_TABLET | Freq: Every day | ORAL | Status: DC
  Administered 2019-05-29 – 2019-06-01 (×3): 50 mg via ORAL
  Filled 2019-05-28 (×3): qty 1

## 2019-05-28 MED ORDER — LIDOCAINE HCL (PF) 2 % IJ SOLN
INTRAMUSCULAR | Status: DC | PRN
Start: 2019-05-28 — End: 2019-05-28
  Administered 2019-05-28: 300 mg

## 2019-05-28 MED ORDER — SODIUM CHLORIDE 0.9 % IV SOLN
200.0000 mg | Freq: Once | INTRAVENOUS | Status: AC
  Administered 2019-05-29: 200 mg via INTRAVENOUS
  Filled 2019-05-28: qty 40

## 2019-05-28 MED ORDER — ADULT MULTIVITAMIN W/MINERALS CH
1.0000 | ORAL_TABLET | Freq: Every day | ORAL | Status: DC
  Administered 2019-05-29 – 2019-06-01 (×4): 1 via ORAL
  Filled 2019-05-28 (×4): qty 1

## 2019-05-28 MED ORDER — PROPOFOL 500 MG/50ML IV EMUL
INTRAVENOUS | Status: DC | PRN
  Administered 2019-05-28: 100 ug/kg/min via INTRAVENOUS

## 2019-05-28 MED ORDER — THIAMINE HCL 100 MG PO TABS
100.0000 mg | ORAL_TABLET | Freq: Every day | ORAL | Status: DC
  Administered 2019-05-29 – 2019-06-01 (×4): 100 mg via ORAL
  Filled 2019-05-28 (×5): qty 1

## 2019-05-28 MED ORDER — HYDROCOD POLST-CPM POLST ER 10-8 MG/5ML PO SUER
5.0000 mL | Freq: Two times a day (BID) | ORAL | Status: DC | PRN
  Administered 2019-05-31 – 2019-06-01 (×3): 5 mL via ORAL
  Filled 2019-05-28 (×4): qty 5

## 2019-05-28 MED ORDER — SODIUM CHLORIDE 0.9 % IV SOLN: INTRAVENOUS | Status: DC

## 2019-05-28 MED ORDER — BUPROPION HCL ER (XL) 150 MG PO TB24
150.0000 mg | ORAL_TABLET | Freq: Every day | ORAL | Status: DC
  Administered 2019-05-29 – 2019-06-01 (×4): 150 mg via ORAL
  Filled 2019-05-28 (×4): qty 1

## 2019-05-28 MED ORDER — FENTANYL CITRATE (PF) 100 MCG/2ML IJ SOLN
INTRAMUSCULAR | Status: AC
  Administered 2019-05-28: 50 ug via INTRAVENOUS
  Filled 2019-05-28: qty 2

## 2019-05-28 MED ORDER — CHLORHEXIDINE GLUCONATE 0.12 % MT SOLN: 15.0000 mL | Freq: Once | OROMUCOSAL | Status: AC

## 2019-05-28 MED ORDER — ACETAMINOPHEN 325 MG PO TABS
650.0000 mg | ORAL_TABLET | Freq: Four times a day (QID) | ORAL | Status: DC | PRN
  Administered 2019-05-29 – 2019-05-30 (×2): 650 mg via ORAL
  Filled 2019-05-28 (×2): qty 2

## 2019-05-28 MED ORDER — ENOXAPARIN SODIUM 40 MG/0.4ML ~~LOC~~ SOLN
40.0000 mg | SUBCUTANEOUS | Status: DC
  Administered 2019-05-29 – 2019-06-01 (×4): 40 mg via SUBCUTANEOUS
  Filled 2019-05-28 (×4): qty 0.4

## 2019-05-28 MED ORDER — ASCORBIC ACID 500 MG PO TABS
500.0000 mg | ORAL_TABLET | Freq: Every day | ORAL | Status: DC
  Administered 2019-05-29 – 2019-06-01 (×4): 500 mg via ORAL
  Filled 2019-05-28 (×4): qty 1

## 2019-05-28 MED ORDER — FENTANYL CITRATE (PF) 100 MCG/2ML IJ SOLN
INTRAMUSCULAR | Status: AC
  Filled 2019-05-28: qty 2

## 2019-05-28 MED ORDER — ONDANSETRON HCL 4 MG PO TABS: 4.0000 mg | ORAL_TABLET | Freq: Four times a day (QID) | ORAL | Status: DC | PRN

## 2019-05-28 MED ORDER — VITAMIN B-12 100 MCG PO TABS
100.0000 ug | ORAL_TABLET | Freq: Every day | ORAL | Status: DC
  Administered 2019-05-29 – 2019-06-01 (×4): 100 ug via ORAL
  Filled 2019-05-28 (×5): qty 1

## 2019-05-28 MED ORDER — LACTATED RINGERS IV SOLN: INTRAVENOUS | Status: DC | PRN

## 2019-05-28 MED ORDER — FENTANYL CITRATE (PF) 100 MCG/2ML IJ SOLN
50.0000 ug | INTRAMUSCULAR | Status: AC | PRN
  Administered 2019-05-28 (×2): 25 ug via INTRAVENOUS
  Administered 2019-05-28: 50 ug via INTRAVENOUS

## 2019-05-28 MED ORDER — CARBIDOPA-LEVODOPA ER 50-200 MG PO TBCR
1.0000 | EXTENDED_RELEASE_TABLET | Freq: Every day | ORAL | Status: DC
  Administered 2019-05-28 – 2019-05-31 (×4): 1 via ORAL
  Filled 2019-05-28 (×5): qty 1

## 2019-05-28 MED ORDER — LABETALOL HCL 5 MG/ML IV SOLN: 20.0000 mg | INTRAVENOUS | Status: DC | PRN

## 2019-05-28 MED ORDER — SODIUM CHLORIDE 0.9 % IV SOLN
2.0000 g | INTRAVENOUS | Status: DC
  Administered 2019-05-29 – 2019-06-01 (×4): 2 g via INTRAVENOUS
  Filled 2019-05-28 (×3): qty 2
  Filled 2019-05-28: qty 20
  Filled 2019-05-28: qty 2

## 2019-05-28 MED ORDER — LISINOPRIL 20 MG PO TABS
20.0000 mg | ORAL_TABLET | Freq: Every day | ORAL | Status: DC
  Administered 2019-05-28 – 2019-05-29 (×2): 20 mg via ORAL
  Filled 2019-05-28 (×2): qty 1

## 2019-05-28 MED ORDER — SODIUM CHLORIDE 0.9 % IV SOLN
100.0000 mg | Freq: Every day | INTRAVENOUS | Status: AC
  Administered 2019-05-29 – 2019-06-01 (×4): 100 mg via INTRAVENOUS
  Filled 2019-05-28 (×4): qty 100

## 2019-05-28 MED ORDER — FAMOTIDINE 20 MG PO TABS
20.0000 mg | ORAL_TABLET | Freq: Two times a day (BID) | ORAL | Status: DC
  Administered 2019-05-28 – 2019-06-01 (×7): 20 mg via ORAL
  Filled 2019-05-28 (×8): qty 1

## 2019-05-28 MED ORDER — BACLOFEN 10 MG PO TABS
10.0000 mg | ORAL_TABLET | Freq: Every day | ORAL | Status: DC
  Administered 2019-05-28 – 2019-06-01 (×5): 10 mg via ORAL
  Filled 2019-05-28 (×5): qty 1

## 2019-05-28 MED ORDER — BUPIVACAINE HCL (PF) 0.5 % IJ SOLN
INTRAMUSCULAR | Status: AC
  Filled 2019-05-28: qty 10

## 2019-05-28 MED ORDER — GUAIFENESIN-DM 100-10 MG/5ML PO SYRP
10.0000 mL | ORAL_SOLUTION | ORAL | Status: DC | PRN
  Administered 2019-05-31: 10 mL via ORAL
  Filled 2019-05-28: qty 10

## 2019-05-28 MED ORDER — FAMOTIDINE 20 MG PO TABS
ORAL_TABLET | ORAL | Status: AC
  Administered 2019-05-28: 20 mg via ORAL
  Filled 2019-05-28: qty 1

## 2019-05-28 MED ORDER — LACTATED RINGERS IV SOLN: Freq: Once | INTRAVENOUS | Status: AC

## 2019-05-28 MED ORDER — MIDAZOLAM HCL 2 MG/2ML IJ SOLN
INTRAMUSCULAR | Status: AC
  Filled 2019-05-28: qty 2

## 2019-05-28 MED ORDER — CHOLECALCIFEROL 10 MCG (400 UNIT) PO TABS
1000.0000 [IU] | ORAL_TABLET | Freq: Every day | ORAL | Status: DC
  Administered 2019-05-29 – 2019-06-01 (×4): 1000 [IU] via ORAL
  Filled 2019-05-28 (×5): qty 3

## 2019-05-28 MED ORDER — DILTIAZEM HCL 25 MG/5ML IV SOLN
5.0000 mg | Freq: Once | INTRAVENOUS | Status: AC
  Administered 2019-05-28: 5 mg via INTRAVENOUS
  Filled 2019-05-28: qty 5

## 2019-05-28 MED ORDER — LIDOCAINE HCL (PF) 2 % IJ SOLN
INTRAMUSCULAR | Status: AC
  Filled 2019-05-28: qty 20

## 2019-05-28 MED ORDER — GABAPENTIN 100 MG PO CAPS
200.0000 mg | ORAL_CAPSULE | Freq: Two times a day (BID) | ORAL | Status: DC
  Administered 2019-05-28 – 2019-06-01 (×5): 200 mg via ORAL
  Filled 2019-05-28 (×8): qty 2

## 2019-05-28 MED ORDER — METOCLOPRAMIDE HCL 10 MG PO TABS: 5.0000 mg | ORAL_TABLET | Freq: Three times a day (TID) | ORAL | Status: DC | PRN

## 2019-05-28 MED ORDER — METOCLOPRAMIDE HCL 5 MG/ML IJ SOLN: 5.0000 mg | Freq: Three times a day (TID) | INTRAMUSCULAR | Status: DC | PRN

## 2019-05-28 MED ORDER — BUPIVACAINE HCL (PF) 0.5 % IJ SOLN
INTRAMUSCULAR | Status: DC | PRN
Start: 2019-05-28 — End: 2019-05-28
  Administered 2019-05-28: 10 mL

## 2019-05-28 MED ORDER — FOLIC ACID 1 MG PO TABS
1.0000 mg | ORAL_TABLET | Freq: Every day | ORAL | Status: DC
  Administered 2019-05-29 – 2019-06-01 (×4): 1 mg via ORAL
  Filled 2019-05-28 (×4): qty 1

## 2019-05-28 MED ORDER — ZINC SULFATE 220 (50 ZN) MG PO CAPS
220.0000 mg | ORAL_CAPSULE | Freq: Every day | ORAL | Status: DC
  Administered 2019-05-29 – 2019-06-01 (×4): 220 mg via ORAL
  Filled 2019-05-28 (×4): qty 1

## 2019-05-28 MED ORDER — CHLORHEXIDINE GLUCONATE 0.12 % MT SOLN
OROMUCOSAL | Status: AC
  Administered 2019-05-28: 15 mL via OROMUCOSAL
  Filled 2019-05-28: qty 15

## 2019-05-28 MED ORDER — TRAZODONE HCL 50 MG PO TABS
25.0000 mg | ORAL_TABLET | Freq: Every evening | ORAL | Status: DC | PRN
  Filled 2019-05-28 (×3): qty 1

## 2019-05-28 MED ORDER — FAMOTIDINE 20 MG PO TABS: 20.0000 mg | ORAL_TABLET | Freq: Once | ORAL | Status: AC

## 2019-05-28 MED ORDER — SODIUM CHLORIDE 0.9 % IV SOLN
500.0000 mg | INTRAVENOUS | Status: DC
  Administered 2019-05-29 – 2019-05-31 (×3): 500 mg via INTRAVENOUS
  Filled 2019-05-28 (×3): qty 500

## 2019-05-28 MED ORDER — MAGNESIUM HYDROXIDE 400 MG/5ML PO SUSP
30.0000 mL | Freq: Every day | ORAL | Status: DC | PRN
  Administered 2019-05-31: 30 mL via ORAL
  Filled 2019-05-28: qty 30

## 2019-05-28 MED ORDER — MIDAZOLAM HCL 2 MG/2ML IJ SOLN
INTRAMUSCULAR | Status: DC | PRN
  Administered 2019-05-28: .5 mg via INTRAVENOUS
  Administered 2019-05-28: 1 mg via INTRAVENOUS

## 2019-05-28 MED ORDER — ASPIRIN EC 81 MG PO TBEC
81.0000 mg | DELAYED_RELEASE_TABLET | Freq: Every day | ORAL | Status: DC
  Administered 2019-05-29 – 2019-06-01 (×4): 81 mg via ORAL
  Filled 2019-05-28 (×4): qty 1

## 2019-05-28 MED ORDER — CEFAZOLIN SODIUM-DEXTROSE 2-4 GM/100ML-% IV SOLN
2.0000 g | INTRAVENOUS | Status: AC
  Administered 2019-05-28: 2 g via INTRAVENOUS

## 2019-05-28 MED ORDER — GUAIFENESIN ER 600 MG PO TB12
600.0000 mg | ORAL_TABLET | Freq: Two times a day (BID) | ORAL | Status: DC
  Administered 2019-05-28 – 2019-06-01 (×8): 600 mg via ORAL
  Filled 2019-05-28 (×8): qty 1

## 2019-05-28 MED ORDER — CEFAZOLIN SODIUM-DEXTROSE 2-4 GM/100ML-% IV SOLN
INTRAVENOUS | Status: AC
  Filled 2019-05-28: qty 100

## 2019-05-28 MED ORDER — ONDANSETRON HCL 4 MG/2ML IJ SOLN: 4.0000 mg | Freq: Four times a day (QID) | INTRAMUSCULAR | Status: DC | PRN

## 2019-05-28 MED ORDER — NEOMYCIN-POLYMYXIN B GU 40-200000 IR SOLN
Status: DC | PRN
  Administered 2019-05-28: 2 mL

## 2019-05-28 MED ORDER — ORAL CARE MOUTH RINSE: 15.0000 mL | Freq: Once | OROMUCOSAL | Status: AC

## 2019-05-28 MED ORDER — CARBIDOPA-LEVODOPA 25-100 MG PO TABS
1.0000 | ORAL_TABLET | Freq: Four times a day (QID) | ORAL | Status: DC
  Administered 2019-05-28 – 2019-06-01 (×15): 1 via ORAL
  Filled 2019-05-28 (×17): qty 1

## 2019-05-28 MED ORDER — DEXAMETHASONE SODIUM PHOSPHATE 10 MG/ML IJ SOLN
6.0000 mg | INTRAMUSCULAR | Status: DC
  Administered 2019-05-28 – 2019-05-31 (×4): 6 mg via INTRAVENOUS
  Filled 2019-05-28 (×4): qty 1

## 2019-05-28 MED ORDER — OXYCODONE HCL 5 MG PO TABS: 5.0000 mg | ORAL_TABLET | Freq: Three times a day (TID) | ORAL | 0 refills | Status: AC | PRN

## 2019-05-28 SURGICAL SUPPLY — 45 items
BNDG ELASTIC 4X5.8 VLCR STR LF (GAUZE/BANDAGES/DRESSINGS) ×1 IMPLANT
CANISTER SUCT 1200ML W/VALVE (MISCELLANEOUS) ×3 IMPLANT
CHLORAPREP W/TINT 26 (MISCELLANEOUS) ×3 IMPLANT
COVER WAND RF STERILE (DRAPES) ×3 IMPLANT
CUFF TOURN SGL QUICK 18X4 (TOURNIQUET CUFF) ×2 IMPLANT
DRAPE FLUOR MINI C-ARM 54X84 (DRAPES) ×3 IMPLANT
ELECT REM PT RETURN 9FT ADLT (ELECTROSURGICAL) ×3
ELECTRODE REM PT RTRN 9FT ADLT (ELECTROSURGICAL) ×1 IMPLANT
GAUZE SPONGE 4X4 12PLY STRL (GAUZE/BANDAGES/DRESSINGS) ×3 IMPLANT
GAUZE XEROFORM 1X8 LF (GAUZE/BANDAGES/DRESSINGS) ×6 IMPLANT
GLOVE SURG SYN 9.0  PF PI (GLOVE) ×2
GLOVE SURG SYN 9.0 PF PI (GLOVE) ×1 IMPLANT
GOWN SRG 2XL LVL 4 RGLN SLV (GOWNS) ×1 IMPLANT
GOWN STRL NON-REIN 2XL LVL4 (GOWNS) ×2
GOWN STRL REUS W/ TWL LRG LVL3 (GOWN DISPOSABLE) ×1 IMPLANT
GOWN STRL REUS W/TWL LRG LVL3 (GOWN DISPOSABLE) ×2
K-WIRE 1.6 (WIRE) ×2
K-WIRE FX5X1.6XNS BN SS (WIRE) ×1
KIT TURNOVER KIT A (KITS) ×3 IMPLANT
KWIRE FX5X1.6XNS BN SS (WIRE) IMPLANT
NDL FILTER BLUNT 18X1 1/2 (NEEDLE) ×1 IMPLANT
NEEDLE FILTER BLUNT 18X 1/2SAF (NEEDLE) ×2
NEEDLE FILTER BLUNT 18X1 1/2 (NEEDLE) ×1 IMPLANT
NS IRRIG 500ML POUR BTL (IV SOLUTION) ×3 IMPLANT
PACK EXTREMITY (MISCELLANEOUS) ×3 IMPLANT
PAD CAST CTTN 4X4 STRL (SOFTGOODS) ×2 IMPLANT
PADDING CAST COTTON 4X4 STRL (SOFTGOODS) ×4
PEG SUBCHONDRAL SMOOTH 2.0X14 (Peg) ×2 IMPLANT
PEG SUBCHONDRAL SMOOTH 2.0X18 (Peg) ×2 IMPLANT
PEG SUBCHONDRAL SMOOTH 2.0X20 (Peg) ×8 IMPLANT
PEG SUBCHONDRAL SMOOTH 2.0X24 (Peg) ×2 IMPLANT
PLATE STAN 24.4X59.5 RT (Plate) ×2 IMPLANT
PUTTY DBX 1CC (Putty) ×3 IMPLANT
PUTTY DBX 1CC DEPUY (Putty) IMPLANT
SCALPEL PROTECTED #15 DISP (BLADE) ×6 IMPLANT
SCREW BN 12X3.5XNS CORT TI (Screw) IMPLANT
SCREW CORT 3.5X10 LNG (Screw) ×2 IMPLANT
SCREW CORT 3.5X12 (Screw) ×2 IMPLANT
SCREW CORT 3.5X14 LNG (Screw) ×4 IMPLANT
SPLINT CAST 1 STEP 3X12 (MISCELLANEOUS) ×3 IMPLANT
SUT ETHILON 4-0 (SUTURE) ×2
SUT ETHILON 4-0 FS2 18XMFL BLK (SUTURE) ×1
SUT VICRYL 3-0 27IN (SUTURE) ×3 IMPLANT
SUTURE ETHLN 4-0 FS2 18XMF BLK (SUTURE) ×1 IMPLANT
SYR 3ML LL SCALE MARK (SYRINGE) ×3 IMPLANT

## 2019-05-28 NOTE — Consult Note (Signed)
Remdesivir - Pharmacy Brief Note   O:  ALT: 23 CXR: Impression of "bibasilar opacities, right worse than left, which may indicate atelectasis or infection" SpO2: Hypoxic requiring supplemental oxygen   A/P:  05/27/19 SARS-CoV-2 PCR positive  Remdesivir 200 mg IVPB once followed by 100 mg IVPB daily x 4 days.   [place] Resident 05/28/2019 8:00 PM

## 2019-05-28 NOTE — H&P (Addendum)
[facility] [place] NAME: Charles Stanton    MR#:  [id number]  DATE OF BIRTH:  [birth year]  DATE OF ADMISSION:  05/28/2019  PRIMARY CARE PHYSICIAN: [name], MD   REQUESTING/REFERRING PHYSICIAN: Hessie Knows, MD/ Tera Mater, MD CHIEF COMPLAINT:  Shortness of breath and low pulse oximetry  HISTORY OF PRESENT ILLNESS:  Charles Stanton  is a 64 y.o. Caucasian male with a known history of Parkinson's disease, hypertension, dyslipidemia, atrial fibrillation on Eliquis and peripheral vascular disease, who underwent ORIF of her right radius fracture this afternoon and postoperatively became hypoxemic with dyspnea and diminishing pulse oximetry from initially 90% on 2 L O2 to 89% requiring 6 L O2 to maintain a pulse oximetry of 94% given the fact that he is a mouth breather he was placed on nonrebreather mask.  The patient fell yesterday when he lost his balance and had subsequent right radius fracture.  He denies any fever at home but had low-grade fever of 99.6 here without chills.  He was having active tremors during my interview.  He admitted to having cough that is mainly dry and occasionally productive of whitish sputum over the last week without wheezing or dyspnea.  No chest pain or palpitations.  No nausea or vomiting or diarrhea.  He lost smell with Parkinson's about 5 years ago.  No dysuria, oliguria or hematuria or flank pain.  His latest vital signs revealed a blood pressure of 183/94 with a temperature of 99.6 heart rate of 132 and respiratory to 32 pulse currently 94% on nonrebreather.  I ordered 5 mg of IV Cardizem which brought his heart rate down to 99 and blood pressure was improving prior to that 150/52.  Stat labs revealed unremarkable CBC and CMP is currently pending.  His COVID-19 PCR was positive yesterday.  Stat portable chest [place] showed bibasal opacities right worse than left that may include atelectasis or infection.  The patient was given 2  g of IV Ancef, 20 mg of IV famotidine and 50 mcg of IV fentanyl as well as started on hydration with IV lactated Ringer at 100 mL/h.  He will be admitted to progressive unit bed for further evaluation and management. PAST MEDICAL HISTORY:   Past Medical History:  Diagnosis Date  . Abnormal liver function   . Anemia   . Anxiety   . Atrial fibrillation ([place])   . Colonic mass   . Depression   . Dizziness   . Dry cough   . History of colon polyps   . Hypercholesteremia   . Hyperlipemia   . Hypertension   . Palpitations   . Parkinson's disease ([place])   . Parkinson's disease ([place])   . PVD (peripheral vascular disease) ([place])   . Tremors of nervous system     PAST SURGICAL HISTORY:   Past Surgical History:  Procedure Laterality Date  . BACK SURGERY     Kyphoplasty   March 2018  . COLONOSCOPY    . COLONOSCOPY WITH PROPOFOL N/A 12/12/2017   Procedure: COLONOSCOPY WITH PROPOFOL;  Surgeon: Jonathon Bellows, MD;  Location: [facility] ENDOSCOPY;  Service: Gastroenterology;  Laterality: N/A;  . ESOPHAGOGASTRODUODENOSCOPY N/A 12/11/2017   Procedure: ESOPHAGOGASTRODUODENOSCOPY (EGD);  Surgeon: Jonathon Bellows, MD;  Location: [facility] ENDOSCOPY;  Service: Gastroenterology;  Laterality: N/A;  . ESOPHAGOGASTRODUODENOSCOPY (EGD) WITH PROPOFOL N/A 09/21/2016   Procedure: ESOPHAGOGASTRODUODENOSCOPY (EGD) WITH PROPOFOL;  Surgeon: Jonathon Bellows, MD;  Location: [facility] ENDOSCOPY;  Service: Gastroenterology;  Laterality: N/A;  . ESOPHAGOGASTRODUODENOSCOPY (  EGD) WITH PROPOFOL N/A 11/07/2016   Procedure: ESOPHAGOGASTRODUODENOSCOPY (EGD) WITH PROPOFOL;  Surgeon: Jonathon Bellows, MD;  Location: [facility] ENDOSCOPY;  Service: Gastroenterology;  Laterality: N/A;  . HERNIA REPAIR     left inguinal hernia repair as an infant  . KYPHOPLASTY N/A 02/11/2016   Procedure: KYPHOPLASTY  L2,T9;  Surgeon: Hessie Knows, MD;  Location: [facility] ORS;  Service: Orthopedics;  Laterality: N/A;  . KYPHOPLASTY N/A 03/15/2016   Procedure: KYPHOPLASTY L3;  Surgeon:  Hessie Knows, MD;  Location: [facility] ORS;  Service: Orthopedics;  Laterality: N/A;  . skin cancer removed    . TONSILLECTOMY      SOCIAL HISTORY:   Social History   Tobacco Use  . Smoking status: Never Smoker  . Smokeless tobacco: Never Used  Substance Use Topics  . Alcohol use: Yes    Alcohol/week: 2.0 standard drinks    Types: 2 Cans of beer per week    Comment: Daily 2 beers and shot of whiskey    FAMILY HISTORY:   Family History  Problem Relation Age of Onset  . Heart Problems Mother   . Dementia Mother     DRUG ALLERGIES:   Allergies  Allergen Reactions  . Amantadine Other (See Comments)    fatigue sleepy Other reaction(s): Other (See Comments) fatigue sleepy fatigue sleepy fatigue sleepy    REVIEW OF SYSTEMS:   ROS As per history of present illness. All pertinent systems were reviewed above. Constitutional,  HEENT, cardiovascular, respiratory, GI, GU, musculoskeletal, neuro, psychiatric, endocrine,  integumentary and hematologic systems were reviewed and are otherwise  negative/unremarkable except for positive findings mentioned above in the HPI.   MEDICATIONS AT HOME:   Prior to Admission medications   Medication Sig Start Date End Date Taking? Authorizing Provider  baclofen (LIORESAL) 10 MG tablet Take 10 mg by mouth daily.  10/03/16  Yes [provider]  buPROPion (WELLBUTRIN XL) 150 MG 24 hr tablet Take 150 mg by mouth daily. 12/20/17  Yes [provider]  carbidopa-levodopa (SINEMET CR) 50-200 MG tablet Take 1 tablet by mouth at bedtime.   Yes [provider]  carbidopa-levodopa (SINEMET IR) 25-100 MG tablet TK 1 T PO four times a day 08/16/16  Yes [provider]  folic acid (FOLVITE) 1 MG tablet Take 1 tablet (1 mg total) by mouth daily. 12/13/17  Yes Fritzi Mandes, MD  gabapentin (NEURONTIN) 100 MG capsule Take 2 capsules (200 mg total) by mouth 2 (two) times daily. 01/27/18  Yes Wieting, Richard, MD  lisinopril  (PRINIVIL,ZESTRIL) 20 MG tablet Take 1 tablet (20 mg total) by mouth at bedtime. 01/27/18  Yes Wieting, Richard, MD  Multiple Vitamin (MULTIVITAMIN WITH MINERALS) TABS tablet Take 1 tablet by mouth daily. 01/28/18  Yes Wieting, Richard, MD  oxyCODONE-acetaminophen (PERCOCET) 5-325 MG tablet Take 1 tablet by mouth every 4 (four) hours as needed for severe pain. 05/27/19 05/26/20 Yes Fisher, Linden Dolin, PA-C  pantoprazole (PROTONIX) 40 MG tablet Take 1 tablet (40 mg total) by mouth 2 (two) times daily. 12/12/17  Yes Fritzi Mandes, MD  sertraline (ZOLOFT) 50 MG tablet Take 1 tablet (50 mg total) by mouth daily. 08/08/16  Yes Rainey Pines, MD  thiamine 100 MG tablet Take 1 tablet (100 mg total) by mouth daily. 01/28/18  Yes Wieting, Richard, MD  traZODone (DESYREL) 100 MG tablet Take 100 mg by mouth at bedtime. 01/17/18  Yes [provider]  vitamin B-12 (CYANOCOBALAMIN) 100 MCG tablet Take 100 mcg by mouth daily.   Yes [provider]  oxyCODONE (ROXICODONE) 5 MG immediate release tablet Take 1 tablet (5 mg total) by mouth every 8 (eight) hours as needed. 05/28/19 05/27/20  Hessie Knows, MD      VITAL SIGNS:  Blood pressure (!) 150/52, pulse (!) 134, temperature 99.6 F (37.6 C), resp. rate (!) 26, SpO2 94 %.  PHYSICAL EXAMINATION:  Physical Exam  GENERAL:  64 y.o.-year-old Caucasian male patient lying in the bed with mild respiratory distress with conversational dyspnea EYES: Pupils equal, round, reactive to light and accommodation. No scleral icterus. Extraocular muscles intact.  HEENT: Head atraumatic, normocephalic. Oropharynx and nasopharynx clear.  NECK:  Supple, no jugular venous distention. No thyroid enlargement, no tenderness.  LUNGS: Diminished bibasal breath sounds with bibasal crackles.   CARDIOVASCULAR: Regular rate and rhythm, S1, S2 normal. No murmurs, rubs, or gallops.  ABDOMEN: Soft, nondistended, nontender. Bowel sounds present. No organomegaly or mass.  EXTREMITIES: No  pedal edema, cyanosis, or clubbing.  NEUROLOGIC: Cranial nerves II through XII are intact. Muscle strength 5/5 in all extremities. Sensation intact. Gait not checked.  PSYCHIATRIC: The patient is alert and oriented x 3.  Normal affect and good eye contact. SKIN: No obvious rash, lesion, or ulcer.   LABORATORY PANEL:   CBC Recent Labs  Lab 05/27/19 1421  WBC 9.0  HGB 13.7  HCT 40.9  PLT 228   ------------------------------------------------------------------------------------------------------------------  Chemistries  Recent Labs  Lab 05/27/19 1421  NA 137  K 4.1  CL 101  CO2 22  GLUCOSE 87  BUN 5*  CREATININE 0.44*  CALCIUM 8.6*  AST 54*  ALT 23  ALKPHOS 146*  BILITOT 0.8   ------------------------------------------------------------------------------------------------------------------  Cardiac Enzymes No results for input(s): TROPONINI in the last 168 hours. ------------------------------------------------------------------------------------------------------------------  RADIOLOGY:  DG Wrist 2 Views Right  Result Date: 05/28/2019 CLINICAL DATA:  Status post ORIF EXAM: RIGHT WRIST - 2 VIEW COMPARISON:  05/27/2019 FINDINGS: Interval surgical plate and multiple screw fixation of comminuted intra-articular distal radius fracture with decreased displacement and angulation. Acute displaced ulnar styloid process fracture. IMPRESSION: 1. Interval internal fixation of distal radius fracture 2. Acute displaced ulnar styloid process fracture Electronically Signed   By: Donavan Foil M.D.   On: 05/28/2019 19:18   DG Wrist 2 Views Right  Result Date: 05/27/2019 CLINICAL DATA:  Post reduction right wrist EXAM: RIGHT WRIST - 2 VIEW COMPARISON:  Earlier same day FINDINGS: Fine bony detail is obscured. Redemonstration fracture of the distal right radius with persistent foreshortening, displacement, and dorsal angulation. IMPRESSION: Distal right radius fracture remains displaced  and angulated. Electronically Signed   By: Macy Mis M.D.   On: 05/27/2019 15:05   DG Wrist Complete Right  Result Date: 05/27/2019 CLINICAL DATA:  Fall with right wrist pain and deformity EXAM: RIGHT WRIST - COMPLETE 3+ VIEW COMPARISON:  None. FINDINGS: Comminuted intra-articular distal right radius fracture with impaction and 4 mm dorsal displacement of the dominant distal fracture fragments and mild apex volar angulation. Probable nondisplaced ulnar styloid fracture. No dislocation. Prominent diffuse right wrist soft tissue swelling. No radiopaque foreign bodies. IMPRESSION: 1. Comminuted impacted displaced angulated intra-articular distal right radius fracture as detailed. 2. Probable nondisplaced ulnar styloid fracture. Electronically Signed   By: Ilona Sorrel M.D.   On: 05/27/2019 12:59   DG Chest Port 1 View  Result Date: 05/28/2019 CLINICAL DATA:  Hypoxia EXAM: PORTABLE CHEST 1 VIEW COMPARISON:  01/23/2018 FINDINGS: Bibasilar opacities, right worse than left. No pleural effusion or pneumothorax. Intermediate sized hiatal hernia. IMPRESSION: Bibasilar  opacities, right worse than left, which may indicate atelectasis or infection. Electronically Signed   By: Ulyses Jarred M.D.   On: 05/28/2019 19:48   [place] OR NERVE BLOCK-IMAGE ONLY [facility])  Result Date: 05/28/2019 There is no interpretation for this exam.  This order is for images obtained during a surgical procedure.  Please See "Surgeries" Tab for more information regarding the procedure.   DG MINI C-ARM IMAGE ONLY  Result Date: 05/28/2019 There is no interpretation for this exam.  This order is for images obtained during a surgical procedure.  Please See "Surgeries" Tab for more information regarding the procedure.      IMPRESSION AND PLAN:  1.  Acute hypoxemic respiratory failure secondary to COVID-19. -The patient will be admitted to a medically monitored isolation bed. -O2 protocol will be followed to keep O2 saturation above  93.   2.  Multifocal pneumonia secondary to COVID-19. -The patient will be admitted to an isolation monitored bed with droplet and contact precautions. -Given multifocal pneumonia we will empirically place the patient on IV Rocephin and Zithromax for possible bacterial superinfection only with elevated Procalcitonin. -The patient will be placed on scheduled Mucinex and as needed Tussionex. -Will obtain sputum Gram stain culture and sensitivity and follow blood cultures. -O2 protocol will be followed. -We will follow CRP, ferritin, LDH and D-dimer. -Will follow manual differential for ANC/ALC ratio as well as follow troponin I and daily CBC with manual differential and CMP. - Will place the patient on IV Remdesivir and IV steroid therapy with Decadron with elevated inflammatory markers. -The patient will be placed on vitamin D3, vitamin C, zinc sulfate, p.o. Pepcid and aspirin. -Actemra can be considered for CRP more than 7 with associated hypoxemia.  3.  Parkinson's disease with current resting tremors. -We will continue his Sinemet.  4.  Hypertensive urgency. -We will continue Zestril and place the patient on as needed IV labetalol.  5.  GERD. -We will continue PPI therapy.  6.  Depression. -We will continue Zoloft and Wellbutrin XL.  7.  Peripheral neuropathy. -We will continue Neurontin.  8.  Chronic atrial fibrillation. -Given history of recurrent falls, she is on no anticoagulation.  9.  Status post right radius ORIF. -Pain management per Ortho.  10.  DVT prophylaxis. -Subtenons Lovenox   All the records are reviewed and case discussed with ED provider. The plan of care was discussed in details with the patient (and family). I answered all questions. The patient agreed to proceed with the above mentioned plan. Further management will depend upon hospital course.   CODE STATUS: Full code  Status is: Inpatient  Remains inpatient appropriate because:Ongoing  diagnostic testing needed not appropriate for outpatient work up, Unsafe d/c plan, IV treatments appropriate due to intensity of illness or inability to take PO and Inpatient level of care appropriate due to severity of illness   Dispo: The patient is from: Home              Anticipated d/c is to: Home              Anticipated d/c date is: 3 days              Patient currently is not medically stable to d/c.    TOTAL TIME TAKING CARE OF THIS PATIENT: 55 minutes.    Christel Mormon M.D on 05/28/2019 at 8:10 PM  [facility]   From 7 PM-7 AM, contact night-coverage [email or web address]  CC: Primary care physician; Hande,  Cherlyn Labella, MD   Note: This dictation was prepared with Dragon dictation along with smaller phrase technology. Any transcriptional errors that result from this process are unintentional.

## 2019-05-28 NOTE — H&P (Signed)
Chief Complaint  Patient presents with  . Pre-op Exam  Right Wrist fracture, surgery Dr.Fradel Baldonado 5.25.21   Charles Stanton is a 64 y.o. male who presents today for evaluation of a right distal radius fracture. The patient suffered a fall earlier today and tried to reach out with his right hand to catch himself. The patient has a history of Parkinson's and has a history of falls. He has undergone several kyphoplasty's in the past due to falls. The patient is right-hand dominant but does use his left hand for most of his activities because of the Parkinson's disease. The patient went to the emergency room after the fall, x-rays of the right wrist demonstrated a displaced, dorsally angulated distal radius fracture with intra-articular component in addition to a probable nondisplaced ulnar styloid fracture. An attempted closed reduction was performed however no improvement of the alignment was visualized on repeat x-rays. He was placed in a sugar tong splint and instructed to follow-up orthopedics. He denies any numbness or tingling to the right hand at today's visit. He denies any surgical history to the right wrist. Pain score is an 8 out of 10 at today's visit. Plan is for the patient to undergo a right wrist ORIF with Dr. Rudene Christians on 05/28/2019.  Past Medical History: Past Medical History:  Diagnosis Date  . Abnormal liver function 03/25/2013  . Adenomatous polyp 03/25/2013  . Anxiety 03/25/2013  . Cancer (CMS-HCC) 2017  skin  . Dizziness 03/25/2013  . Dog bite of lower leg 05/10/2013  . HTN (hypertension) 03/25/2013  . Hyperlipidemia 03/25/2013  . Palpitations 03/25/2013  . Parkinson's disease (CMS-HCC) 03/25/2013  . Tonsillitis 03/25/2013   Past Surgical History: Past Surgical History:  Procedure Laterality Date  . Colonoscopy x2, the last 09/09/2009  . Kyphoplasty L2,T9 02/11/2016  Dr.Ronith Berti  . KYPHOPLASTY L3 03/15/2016  Dr. Rudene Christians  . TONSILLECTOMY   Past Family History: Family History  Problem Relation  Age of Onset  . Cardiomyopathy (Abnormal function of the heart muscle) Mother  . Colon cancer Mother  . Dementia Mother  . Alzheimer's disease Mother  . Diabetes Father  . Kidney failure Father  . Breast cancer Paternal Grandmother  . Tremor Neg Hx  . Parkinsonism Neg Hx   Medications: Current Outpatient Medications Ordered in Epic  Medication Sig Dispense Refill  . alendronate (FOSAMAX) 70 MG tablet TAKE 1 TABLET(70 MG) BY MOUTH EVERY 7 DAYS WITH A FULL GLASS OF WATER. DO NOT LIE DOWN FOR THE NEXT 30 MINUTES 12 tablet 3  . buPROPion (WELLBUTRIN XL) 150 MG XL tablet TAKE 1 TABLET(150 MG) BY MOUTH EVERY DAY 90 tablet 1  . carbidopa-levodopa (RYTARY) 23.75-95 mg CR capsule Take 1 capsule by mouth nightly 90 capsule 3  . carbidopa-levodopa (SINEMET CR) 25-100 mg CR tablet Take 1 tablet by mouth 4 (four) times daily 360 tablet 3  . cyanocobalamin (VITAMIN B12) 1000 MCG tablet Take 1,000 mcg by mouth once daily.  . FUROsemide (LASIX) 20 MG tablet TAKE 1 TABLET(20 MG) BY MOUTH EVERY DAY 90 tablet 1  . oxyCODONE-acetaminophen (PERCOCET) 5-325 mg tablet Take 1 tablet by mouth every 4 (four) hours as needed  . pantoprazole (PROTONIX) 20 MG DR tablet Take 20 mg by mouth once daily  . potassium chloride 20 mEq/15 mL oral solution Take 15 mLs (20 mEq total) by mouth once daily 450 mL 3  . sertraline (ZOLOFT) 50 MG tablet Take 1 tablet (50 mg total) by mouth once daily 90 tablet 1  . traZODone (  DESYREL) 100 MG tablet TAKE 1 TABLET(100 MG) BY MOUTH EVERY NIGHT 90 tablet 1  . carbidopa-levodopa (SINEMET CR) 50-200 mg CR tablet Take 1 tablet by mouth nightly. (Patient not taking: Reported on 01/17/2019 ) 90 tablet 3  . carbidopa-levodopa (SINEMET) 25-100 mg tablet Take 1 tablet by mouth 4 (four) times daily for 90 days [id number] tablet 3  . folic acid (FOLVITE) 1 MG tablet Take 1 tablet (1 mg total) by mouth once daily for 30 days 30 tablet 3  . lisinopril (PRINIVIL,ZESTRIL) 10 MG tablet Take 1 tablet (10 mg  total) by mouth once daily 90 tablet 3   No current Epic-ordered facility-administered medications on file.   Allergies: Allergies  Allergen Reactions  . Amantadine Other (See Comments)  fatigue sleepy fatigue sleepy fatigue sleepy  fatigue sleepy Other reaction(s): Other (See Comments) fatigue sleepy fatigue sleepy fatigue sleepy    Review of Systems:  A comprehensive 14 point ROS was performed, reviewed by me today, and the pertinent orthopaedic findings are documented in the HPI.  Exam: BP 125/63 (BP Location: Left upper arm, Patient Position: Sitting, BP Cuff Size: Adult)  Ht 162.6 cm (5\' 4" )  Wt 61.2 kg (135 lb)  BMI 23.17 kg/m  General/Constitutional: The patient appears to be well-nourished, well-developed, and in no acute distress. Neuro/Psych: Normal mood and affect, oriented to person, place and time. Eyes: Non-icteric. Pupils are equal, round, and reactive to light, and exhibit synchronous movement. ENT: Unremarkable. Lymphatic: No palpable adenopathy. Respiratory: Lungs clear to auscultation, Normal chest excursion, No wheezes and Non-labored breathing Cardiovascular: Regular rate and rhythm. No murmurs. and No edema, swelling or tenderness, except as noted in detailed exam. Integumentary: No impressive skin lesions present, except as noted in detailed exam. Musculoskeletal: Unremarkable, except as noted in detailed exam.  The patient presents today in a wheelchair. The patient does have a significant tremor affecting the right upper extremity. The patient is in a sugar tong splint, mild swelling present to the patient's fingers. He is able to flex and extend gently his first through fourth digits, unable to extend the fifth digit due to previous injury. He is intact to light touch to the dorsal and volar aspect of the right hand. Cap refill is intact to each individual digit. The patient is intact light touch along the proximal aspect of the sugar tong splint as  well. The patient is wearing a shoulder sling at today's appointment as well.  Imaging: X-rays obtained in the emergency room are detailed in the HPI above.  Impression: Other closed fracture of distal end of right radius, initial encounter [S52.591A] Other closed fracture of distal end of right radius, initial encounter (primary encounter diagnosis)  Plan:  1. Treatment options were discussed today with the patient. 2. The patient has suffered a displaced right distal radius fracture earlier today. 3. The patient was instructed on the risk and benefits of surgical intervention and wishes to proceed at this time. 4. The patient will be scheduled for a right distal radius ORIF with Dr. Rudene Christians on 05/28/2019. 5. This document will serve as the surgical history and physical for the patient. 6. The patient will follow-up 3 days after undergoing surgery for a skin check of the right wrist. They can call the clinic they have any questions, new symptoms develop or symptoms worsen.  The procedure was discussed with the patient, as were the potential risks (including bleeding, infection, nerve and/or blood vessel injury, persistent or recurrent pain, failure of the hardware, progression  of arthritis, need for further surgery, blood clots, strokes, heart attacks and/or arhythmias, pneumonia, etc.) and benefits. The patient states his understanding and wishes to proceed.  This office visit took 30 minutes, of which >50% involved patient counseling/education.  Review of the [place] CSRS was performed in accordance of the [place] prior to dispensing any controlled drugs.  This note was generated in part with voice recognition software and I apologize for any typographical errors that were not detected and corrected.  Raquel James, PA-C [place]   Electronically signed by Lattie Corns, PA at 05/27/2019 4:26 PM EDT  Patient subsequently tested positive for Covid 19.  With the amount  of displacement of the fracture, it cannot safely be delayed for 2 weeks and will done with Covid precautions. Reviewed paper H+P, will be scanned into chart. No changes noted.

## 2019-05-28 NOTE — OR Nursing (Signed)
IV pulled out on transfer to bed.  New IV restarted in left wrist.

## 2019-05-28 NOTE — Telephone Encounter (Signed)
Called patient to assure he is aware of covid positive result.  I gave him result. Instructed to call about his scheduled surgery.  He understands isolationa nd quarantine cdc guidelines.

## 2019-05-28 NOTE — Telephone Encounter (Signed)
Called to discuss with patient about Covid symptoms and the use of bamlanivimab, a monoclonal antibody infusion for those with mild to moderate Covid symptoms and at a high risk of hospitalization.  Pt is qualified for this infusion at the [facility] infusion center due to multiple co-morbid conditions if he is symptomatic with Covid symptoms  Message left to call back

## 2019-05-28 NOTE — Op Note (Signed)
05/28/2019  5:23 PM  PATIENT:  Charles Stanton  65 y.o. male  PRE-OPERATIVE DIAGNOSIS:  Other closed intra-articular fracture of distal end of right radius  POST-OPERATIVE DIAGNOSIS:  Other closed intra-articular fracture of distal end of right radius  PROCEDURE:  Procedure(s): OPEN REDUCTION INTERNAL FIXATION (ORIF) DISTAL RADIAL FRACTURE (Right)  SURGEON: Laurene Footman, MD  ASSISTANTS: None  ANESTHESIA:   paracervical block  EBL:  Total I/O In: 250 [I.V.:250] Out: -   BLOOD ADMINISTERED:none  DRAINS: none   LOCAL MEDICATIONS USED:  NONE  SPECIMEN:  No Specimen  DISPOSITION OF SPECIMEN:  N/A  COUNTS:  YES  TOURNIQUET:   Total Tourniquet Time Documented: Upper Arm (Right) - 40 minutes Total: Upper Arm (Right) - 40 minutes   IMPLANTS: Hand innovations standard with standard length plate with multiple smooth pegs and screws  DICTATION: .Dragon Dictation patient was brought to the operating room and after a block had been obtained in the preoperative area the arm was prepped and draped in the usual sterile fashion with a tourniquet applied the upper arm.  After patient identification and timeout procedure were completed with COVID-19 restrictions in place tourniquet was raised.  A volar approach was made centered over the FCR tendon.  The tendon sheath was incised the tendon retracted radially off the radial artery and associated veins.  Pronator was then elevated off the distal fragment which was quite comminuted and the shaft.  The bone quality was quite poor.  Fingertrap traction was applied to the start of the case with 10 pounds of traction operatory length but there was a radial styloid fragment that was difficult to get in a reduced position once it was the plate was applied and the radial styloid pin appeared to grab this fragment to aid in stability.  There was some fracture crush injury and so prior to closure 1 cc of DBX bone putty was inserted into the defect.  With  the standard with plate all the smooth peg holes were filled with the wrist felt held in the flexed position to try to get these in an acceptable position.  All this peg holes were filled using standard technique drilling measuring and placing the smooth pegs.  These were all hand tightened and there was acceptable reduction in both AP and lateral projections.  The shaft screws were then inserted drilling measuring and placing bicortical screws.  With his core Parkinson's disease is the screws were placed across both cortices to try to help maintain alignment with his tremor.  The the wound was thoroughly irrigated and then the bone putty was inserted the tourniquet was let down at the close of the case and there is minimal bleeding with electrocautery used for hemostasis.  The wound was closed with 4-0 nylon after 3-0 Vicryl subcutaneous.  Xeroform 4 x 4 web roll and a volar splint were applied followed by Ace wrap.  PLAN OF CARE: Discharge to home after PACU  PATIENT DISPOSITION:  PACU - hemodynamically stable.

## 2019-05-28 NOTE — Discharge Instructions (Addendum)
Try to keep the hand elevated is much as possible.  Ice to the back of the wrist may help with pain and swelling. Keep dressing clean and dry. Try to move fingers is much as you can. Pain medicine as directed. Call office if you are having problems  AMBULATORY SURGERY  DISCHARGE INSTRUCTIONS   1) The drugs that you were given will stay in your system until tomorrow so for the next 24 hours you should not:  A) Drive an automobile B) Make any legal decisions C) Drink any alcoholic beverage   2) You may resume regular meals tomorrow.  Today it is better to start with liquids and gradually work up to solid foods.  You may eat anything you prefer, but it is better to start with liquids, then soup and crackers, and gradually work up to solid foods.   3) Please notify your doctor immediately if you have any unusual bleeding, trouble breathing, redness and pain at the surgery site, drainage, fever, or pain not relieved by medication.    4) Additional Instructions:        Please contact your physician with any problems or Same Day Surgery at [phone], Monday through Friday 6 am to 4 pm, or [place] at [facility] number at [phone].  Peripheral Nerve Block, Upper Extremity (PNBUE) Discharge Instructions   1.  For your surgery you have received a Peripheral Nerve Block. 2. Nerve Blocks affect many types of nerves, including nerves that control movement, pain and normal sensation.  You may experience feelings such as numbness, tingling, heaviness, weakness or the inability to move your arm or the feeling or sensation that your arm has "fallen asleep". 3. A nerve block can last for 2 - 36 hours or more depending on the medication used.  Usually the weakness wears off first.  The tingling and heaviness usually wear off next.  Finally you may start to notice pain.  Keep in mind that this may occur in any order.  once a nerve block starts to wear off it is usually completely  gone within 60 minutes. 4. If needed, your surgeon will give you a prescription for pain medication.  It will take about 60 minutes for the oral pain medication to become fully effective.  So, it is recommended that you start taking this medication before the nerve block first begins to wear off, or when you first begin to feel discomfort. 5. Keep in mind that nerve blocks often wear off in the middle of the night.  If you are going to bed and the block has not started to wear off or you have not started to have any discomfort, consider setting an alarm for 2 to 3 hours, so you can assess your block.  If you notice the block is wearing off or you are starting to have discomfort, you can take your pain medication. 6. Take your pain medication only as prescribed.  Pain medication can cause sedation and decrease your breathing if you take more than you need for the level of pain that you have. 7. Nausea is a common side effect of many pain medications.  You may want to eat something before taking your pain medicine to prevent nausea. 8. After a peripheral nerve block, you cannot feel pain, pressure or extremes in temperature in the effected arm.  Because your arm is numb it is at an increased risk for injury.  To decrease the possibility of injury, please practice the following:  a.  While you are awake change the position of your arm frequently to prevent too much pressure on any one area for prolonged periods of time. b.  If you have a cast or tight dressing, check the color or your fingers every couple of hours.  Call your surgeon with the appearance of any discoloration (white or blue). c. If you are given a sling to wear before you go home, please wear it  at all times until the block has completely worn off.  Do not get up at night without your sling. d. If you experience any problems or concerns, please contact your surgeon's office. e. If you experience severe or prolonged shortness of breath go to  the nearest emergency department.

## 2019-05-28 NOTE — Anesthesia Postprocedure Evaluation (Signed)
Anesthesia Post Note  Patient: Charles Stanton  Procedure(s) Performed: OPEN REDUCTION INTERNAL FIXATION (ORIF) DISTAL RADIAL FRACTURE (Right Wrist)  Patient location during evaluation: PACU Anesthesia Type: Regional Level of consciousness: awake and alert Pain management: pain level controlled Respiratory status: spontaneous breathing, patient connected to face mask oxygen and respiratory function unstable Cardiovascular status: blood pressure returned to baseline and stable Postop Assessment: no apparent nausea or vomiting Anesthetic complications: yes Anesthetic complication details: anesthesia complicationsComments: Unplanned admission Pt was incidentally diagnosed with Covid on pre-op screening.  He acknowledged a few days of coughing.  He received a interscalene block pre-op and a general anesthetic with native airway for radius ORIF.  No issues during the case.  Pt was slow to emerge.  He was moderately tachypneic and tachycardic in the OR with borderline SpO2.  Known CXR showed bilateral opacities likely from Covid PNA.  Failure to wean from O2 resulted in admission to hospitalist service.  Report given to hospitalist personally. KR     Last Vitals:  Vitals:   05/28/19 2050 05/28/19 2055  BP: (!) 153/70   Pulse: (!) 102 99  Resp: (!) 25 (!) 25  Temp: (!) 36.4 C   SpO2: 94% 94%    Last Pain:  Vitals:   05/28/19 1902  PainSc: 0-No pain                 Brett Canales Loriel Diehl

## 2019-05-28 NOTE — Anesthesia Procedure Notes (Signed)
Procedure Name: General with mask airway Date/Time: 05/28/2019 4:00 PM Performed by: Allean Found, CRNA Pre-anesthesia Checklist: Patient identified, Emergency Drugs available, Suction available, Patient being monitored and Timeout performed Patient Re-evaluated:Patient Re-evaluated prior to induction Oxygen Delivery Method: Simple face mask Induction Type: IV induction Placement Confirmation: positive ETCO2

## 2019-05-28 NOTE — Transfer of Care (Signed)
Immediate Anesthesia Transfer of Care Note  Patient: Charles Stanton  Procedure(s) Performed: OPEN REDUCTION INTERNAL FIXATION (ORIF) DISTAL RADIAL FRACTURE (Right Wrist)  Patient Location: PACU  Anesthesia Type:General  Level of Consciousness: awake  Airway & Oxygen Therapy: Patient Spontanous Breathing and Patient connected to face mask oxygen  Post-op Assessment: Report given to RN and Post -op Vital signs reviewed and stable  Post vital signs: Reviewed and stable  Last Vitals:  Vitals Value Taken Time  BP    Temp    Pulse    Resp    SpO2      Last Pain:  Vitals:   05/28/19 1547  PainSc: 5          Complications: No apparent anesthesia complications

## 2019-05-28 NOTE — Anesthesia Procedure Notes (Signed)
Anesthesia Regional Block: Supraclavicular block   Pre-Anesthetic Checklist: ,, timeout performed, Correct Patient, Correct Site, Correct Laterality, Correct Procedure, Correct Position, site marked, Risks and benefits discussed,  Surgical consent,  Pre-op evaluation,  At surgeon's request and post-op pain management  Laterality: Right  Prep: chloraprep       Needles:  Injection technique: Single-shot  Needle Type: Echogenic Needle     Needle Length: 4cm  Needle Gauge: 25     Additional Needles:   Narrative:  Start time: 05/28/2019 3:25 PM End time: 05/28/2019 3:31 PM Injection made incrementally with aspirations every 5 mL.  Performed by: Personally  Anesthesiologist: Arita Miss, MD  Additional Notes: Patient consented for risk and benefits of nerve block including but not limited to: nerve damage, failed block, bleeding and infection, hemidiaphragmatic paralysis and shortness of breath, Horner's syndrome.  Patient voiced understanding.  Functioning IV was confirmed and monitors were applied.  Sterile prep,hand hygiene and sterile gloves were used.  Minimal sedation used for procedure.   No paresthesia endorsed by patient during the procedure.  Negative aspiration and negative test dose prior to incremental administration of local anesthetic. The patient tolerated the procedure well with no immediate complications.

## 2019-05-28 NOTE — Anesthesia Preprocedure Evaluation (Signed)
Anesthesia Evaluation  Patient identified by MRN, date of birth, ID band Patient awake    Reviewed: Allergy & Precautions, H&P , NPO status , Patient's Chart, lab work & pertinent test results  History of Anesthesia Complications Negative for: history of anesthetic complications  Airway Mallampati: III  TM Distance: <3 FB Neck ROM: limited    Dental  (+) Chipped, Poor Dentition, Missing, Lower Dentures, Partial Upper, Dental Advisory Given   Pulmonary neg pulmonary ROS, neg shortness of breath,    breath sounds clear to auscultation       Cardiovascular Exercise Tolerance: Good hypertension, (-) angina+ Peripheral Vascular Disease  (-) Past MI and (-) PND + dysrhythmias Atrial Fibrillation  Rhythm:Regular Rate:Normal - Systolic murmurs    Neuro/Psych PSYCHIATRIC DISORDERS Anxiety Depression negative neurological ROS     GI/Hepatic Neg liver ROS, PUD, GERD  ,  Endo/Other  diabetes, Type 2  Renal/GU negative Renal ROS  negative genitourinary   Musculoskeletal   Abdominal   Peds  Hematology negative hematology ROS (+)   Anesthesia Other Findings Past Medical History: No date: Abnormal liver function No date: Anemia No date: Anxiety No date: Atrial fibrillation (HCC) No date: Colonic mass No date: Depression No date: Dizziness No date: Dry cough No date: History of colon polyps No date: Hypercholesteremia No date: Hyperlipemia No date: Hypertension No date: Palpitations No date: Parkinson's disease ([place]) No date: Parkinson's disease ([place]) No date: PVD (peripheral vascular disease) ([place]) No date: Tremors of nervous system  Past Surgical History: No date: BACK SURGERY     Comment:  Kyphoplasty   March 2018 No date: COLONOSCOPY 09/21/2016: ESOPHAGOGASTRODUODENOSCOPY (EGD) WITH PROPOFOL; N/A     Comment:  Procedure: ESOPHAGOGASTRODUODENOSCOPY (EGD) WITH               PROPOFOL;  Surgeon: Jonathon Bellows, MD;   Location: [facility]               ENDOSCOPY;  Service: Gastroenterology;  Laterality: N/A; 11/07/2016: ESOPHAGOGASTRODUODENOSCOPY (EGD) WITH PROPOFOL; N/A     Comment:  Procedure: ESOPHAGOGASTRODUODENOSCOPY (EGD) WITH               PROPOFOL;  Surgeon: Jonathon Bellows, MD;  Location: [facility]               ENDOSCOPY;  Service: Gastroenterology;  Laterality: N/A; No date: HERNIA REPAIR     Comment:  left inguinal hernia repair as an infant 02/11/2016: KYPHOPLASTY; N/A     Comment:  Procedure: KYPHOPLASTY  L2,T9;  Surgeon: Hessie Knows,               MD;  Location: [facility] ORS;  Service: Orthopedics;                Laterality: N/A; 03/15/2016: KYPHOPLASTY; N/A     Comment:  Procedure: KYPHOPLASTY L3;  Surgeon: Hessie Knows, MD;                Location: [facility] ORS;  Service: Orthopedics;  Laterality:               N/A; No date: skin cancer removed No date: TONSILLECTOMY  BMI    Body Mass Index:  26.35 kg/m      Reproductive/Obstetrics negative OB ROS                             Anesthesia Physical  Anesthesia Plan  ASA: III  Anesthesia Plan: Regional   Post-op Pain Management:  Induction: Intravenous  PONV Risk Score and Plan: 1 and Propofol infusion and TIVA  Airway Management Planned: Natural Airway and Nasal Cannula  Additional Equipment: None  Intra-op Plan:   Post-operative Plan:   Informed Consent: I have reviewed the patients History and Physical, chart, labs and discussed the procedure including the risks, benefits and alternatives for the proposed anesthesia with the patient or authorized representative who has indicated his/her understanding and acceptance.     Dental Advisory Given  Plan Discussed with: CRNA and Surgeon  Anesthesia Plan Comments: (Patient is newly covid positive, has light cough. Surgeon aware, procedure needs to be performed to fix broken bone. Discussed r/b/a of anesthesia, suggested regional anesthesia with supraclavicular block  to hopefully avoid airway intervention. Discussed r/b/a of interscalene block, including elective nature. Risks discussed: - Rare: bleeding, infection, nerve damage - shortness of breath from hemidiaphragmatic paralysis - unilateral horner's syndrome - poor/non-working blocks necessitating airway intervention and its associated risks. Patient understands and agrees.  )        Anesthesia Quick Evaluation

## 2019-05-29 ENCOUNTER — Inpatient Hospital Stay: Payer: Medicare Other

## 2019-05-29 DIAGNOSIS — F329 Major depressive disorder, single episode, unspecified: Secondary | ICD-10-CM

## 2019-05-29 DIAGNOSIS — G20 Parkinson's disease: Secondary | ICD-10-CM

## 2019-05-29 DIAGNOSIS — I482 Chronic atrial fibrillation, unspecified: Secondary | ICD-10-CM

## 2019-05-29 LAB — COMPREHENSIVE METABOLIC PANEL
ALT: 5 U/L (ref 0–44)
AST: 34 U/L (ref 15–41)
Albumin: 2.9 g/dL — ABNORMAL LOW (ref 3.5–5.0)
Alkaline Phosphatase: 85 U/L (ref 38–126)
Anion gap: 11 (ref 5–15)
BUN: 9 mg/dL (ref 8–23)
CO2: 23 mmol/L (ref 22–32)
Calcium: 8.2 mg/dL — ABNORMAL LOW (ref 8.9–10.3)
Chloride: 96 mmol/L — ABNORMAL LOW (ref 98–111)
Creatinine, Ser: 0.54 mg/dL — ABNORMAL LOW (ref 0.61–1.24)
GFR calc Af Amer: >60 mL/min (ref >60)
GFR calc non Af Amer: >60 mL/min (ref >60)
Glucose, Bld: 100 mg/dL — ABNORMAL HIGH (ref 70–99)
Potassium: 3.9 mmol/L (ref 3.5–5.1)
Sodium: 130 mmol/L — ABNORMAL LOW (ref 135–145)
Total Bilirubin: 1.5 mg/dL — ABNORMAL HIGH (ref 0.3–1.2)
Total Protein: 5.5 g/dL — ABNORMAL LOW (ref 6.5–8.1)

## 2019-05-29 LAB — CBC WITH DIFFERENTIAL/PLATELET
Abs Immature Granulocytes: 0.03 10*3/uL (ref 0.00–0.07)
Basophils Absolute: 0 10*3/uL (ref 0.0–0.1)
Basophils Relative: 0 %
Eosinophils Absolute: 0 10*3/uL (ref 0.0–0.5)
Eosinophils Relative: 0 %
HCT: 35.4 % — ABNORMAL LOW (ref 39.0–52.0)
Hemoglobin: 12 g/dL — ABNORMAL LOW (ref 13.0–17.0)
Immature Granulocytes: 0 %
Lymphocytes Relative: 3 %
Lymphs Abs: 0.3 10*3/uL — ABNORMAL LOW (ref 0.7–4.0)
MCH: 31.2 pg (ref 26.0–34.0)
MCHC: 33.9 g/dL (ref 30.0–36.0)
MCV: 91.9 fL (ref 80.0–100.0)
Monocytes Absolute: 0.5 10*3/uL (ref 0.1–1.0)
Monocytes Relative: 6 %
Neutro Abs: 8 10*3/uL — ABNORMAL HIGH (ref 1.7–7.7)
Neutrophils Relative %: 91 %
Platelets: 133 10*3/uL — ABNORMAL LOW (ref 150–400)
RBC: 3.85 MIL/uL — ABNORMAL LOW (ref 4.22–5.81)
RDW: 14.5 % (ref 11.5–15.5)
Smear Review: NORMAL
WBC: 8.8 10*3/uL (ref 4.0–10.5)
nRBC: 0 % (ref 0.0–0.2)

## 2019-05-29 LAB — FERRITIN: Ferritin: 143 ng/mL (ref 24–336)

## 2019-05-29 LAB — MAGNESIUM: Magnesium: 1.4 mg/dL — ABNORMAL LOW (ref 1.7–2.4)

## 2019-05-29 LAB — C-REACTIVE PROTEIN
CRP: 8.3 mg/dL — ABNORMAL HIGH (ref <1.0)
CRP: 14.4 mg/dL — ABNORMAL HIGH (ref <1.0)

## 2019-05-29 LAB — HIV ANTIBODY (ROUTINE TESTING W REFLEX): HIV Screen 4th Generation wRfx: NONREACTIVE

## 2019-05-29 LAB — FIBRIN DERIVATIVES D-DIMER (ARMC ONLY): Fibrin derivatives D-dimer (ARMC): 3673.88 ng/mL (FEU) — ABNORMAL HIGH (ref 0.00–499.00)

## 2019-05-29 LAB — PROCALCITONIN: Procalcitonin: 32 ng/mL

## 2019-05-29 MED ORDER — OXYCODONE-ACETAMINOPHEN 5-325 MG PO TABS
1.0000 | ORAL_TABLET | ORAL | Status: DC | PRN
  Administered 2019-05-29: 1 via ORAL
  Filled 2019-05-29 (×2): qty 1

## 2019-05-29 MED ORDER — ENSURE ENLIVE PO LIQD
237.0000 mL | Freq: Two times a day (BID) | ORAL | Status: DC
  Administered 2019-05-29 – 2019-06-01 (×4): 237 mL via ORAL

## 2019-05-29 MED ORDER — IOHEXOL 350 MG/ML SOLN
75.0000 mL | Freq: Once | INTRAVENOUS | Status: AC | PRN
  Administered 2019-05-29: 75 mL via INTRAVENOUS

## 2019-05-29 NOTE — Progress Notes (Signed)
Rn updated wife Burman Nieves over phone on patient condition. I will continue to assess.

## 2019-05-29 NOTE — Evaluation (Signed)
Physical Therapy Evaluation Patient Details Name: Charles Stanton MRN: [id number] DOB: 05-Sep-1955 Today's Date: 05/29/2019   History of Present Illness  Per MD notes: Pt is a 65 y.o. male with a PMH that includes: Parkinson's disease, hypertension, dyslipidemia, atrial fibrillation, and peripheral vascular disease, who underwent ORIF of his right radius fracture after recent fall.  Pt postoperatively became hypoxemic with dyspnea and diminishing pulse oximetry from initially requiring 2LO2 to requiring 6LO2.  MD assessment includes: Acute hypoxemic respiratory failure and multifocal pneumonia secondary to COVID-19, right distal radius ORIF, and hypertensive urgency.    Clinical Impression  Pt pleasant and motivated to participate during the session.  Pt required min A with bed mobility tasks and close CGA with transfers from an elevated surface.  Pt was able to take several small steps near the EOB before c/o SOB and returned to sitting.  Pt's SpO2 found to me in the mid 90s and HR WNL with SOB resolving quickly upon sitting.  Pt would not be safe to return to his prior living situation and will benefit from PT services in a SNF setting upon discharge to safely address deficits listed in patient problem list for decreased caregiver assistance and eventual return to PLOF.       Follow Up Recommendations SNF;Supervision for mobility/OOB    Equipment Recommendations  Other (comment);3in1 (PT)(TBD at next venue of care upon discharge to SNF)    Recommendations for Other Services       Precautions / Restrictions Precautions Precautions: Fall Restrictions Weight Bearing Restrictions: Yes RUE Weight Bearing: Non weight bearing Other Position/Activity Restrictions: Pt may use platform walker on the RUE      Mobility  Bed Mobility Overal bed mobility: Needs Assistance Bed Mobility: Sit to Supine;Supine to Sit     Supine to sit: Min assist Sit to supine: Min assist   General bed mobility  comments: Min A for trunk control and positioning  Transfers Overall transfer level: Needs assistance Equipment used: Right platform walker Transfers: Sit to/from Stand Sit to Stand: Min guard;From elevated surface         General transfer comment: Mod verbal cues for sequencing to prevent WB through the R wrist  Ambulation/Gait Ambulation/Gait assistance: Min guard Gait Distance (Feet): 5 Feet Assistive device: Right platform walker Gait Pattern/deviations: Step-through pattern;Decreased step length - right;Decreased step length - left Gait velocity: decreased   General Gait Details: Short B step length but steady with pt reporting SOB after limited amb, SpO2 in the mid 90s and HR WNL  Stairs            Wheelchair Mobility    Modified Rankin (Stroke Patients Only)       Balance Overall balance assessment: Needs assistance Sitting-balance support: Feet supported;Single extremity supported Sitting balance-Leahy Scale: Fair     Standing balance support: Bilateral upper extremity supported Standing balance-Leahy Scale: Fair                               Pertinent Vitals/Pain Pain Assessment: 0-10 Pain Score: 5  Pain Location: RUE Pain Descriptors / Indicators: Aching;Sore Pain Intervention(s): Premedicated before session;Monitored during session    [place] expects to be discharged to:: Private residence Living Arrangements: Spouse/significant other Available Help at Discharge: Family;Available PRN/intermittently(Spouse works during the day) Type of Home: House Home Access: Stairs to enter Entrance Stairs-Rails: Right;Left;Can reach both Technical brewer of Steps: 3 Home Layout: One level Home Equipment:  Walker - 2 wheels;Walker - 4 wheels;Cane - quad      Prior Function Level of Independence: Independent with assistive device(s)         Comments: Mod Ind amb with a rollator indoors and with a QC outdoors, 2  falls in the last 6 months including current fall secondary to LOB     Hand Dominance   Dominant Hand: Right    Extremity/Trunk Assessment   Upper Extremity Assessment Upper Extremity Assessment: Generalized weakness;RUE deficits/detail RUE: Unable to fully assess due to immobilization;Unable to fully assess due to pain    Lower Extremity Assessment Lower Extremity Assessment: Generalized weakness       Communication   Communication: No difficulties  Cognition Arousal/Alertness: Awake/alert Behavior During Therapy: WFL for tasks assessed/performed Overall Cognitive Status: Within Functional Limits for tasks assessed                                        General Comments      Exercises Total Joint Exercises Ankle Circles/Pumps: AROM;Strengthening;Both;5 reps;10 reps Quad Sets: Strengthening;Both;5 reps;10 reps Gluteal Sets: Strengthening;Both;5 reps;10 reps Heel Slides: AROM;Both;5 reps Hip ABduction/ADduction: Strengthening;Both;5 reps Straight Leg Raises: Strengthening;Both;5 reps Long Arc Quad: Strengthening;Both;10 reps Marching in Standing: AROM;Strengthening;Both;5 reps;Standing Other Exercises Other Exercises: HEP education for BLE APs, QS, and GS x 10 each every 1-2 hours daily   Assessment/Plan    PT Assessment Patient needs continued PT services  PT Problem List Decreased strength;Decreased range of motion;Decreased activity tolerance;Decreased balance;Decreased mobility;Decreased knowledge of use of DME;Decreased safety awareness;Pain       PT Treatment Interventions DME instruction;Gait training;Stair training;Functional mobility training;Therapeutic activities;Therapeutic exercise;Balance training;Patient/family education    PT Goals (Current goals can be found in the Care Plan section)  Acute Rehab PT Goals Patient Stated Goal: To get stronger PT Goal Formulation: With patient Time For Goal Achievement: 06/11/19 Potential to  Achieve Goals: Good    Frequency 7X/week   Barriers to discharge Inaccessible home environment;Decreased caregiver support      Co-evaluation               AM-PAC PT "6 Clicks" Mobility  Outcome Measure Help needed turning from your back to your side while in a flat bed without using bedrails?: A Little Help needed moving from lying on your back to sitting on the side of a flat bed without using bedrails?: A Little Help needed moving to and from a bed to a chair (including a wheelchair)?: A Little Help needed standing up from a chair using your arms (e.g., wheelchair or bedside chair)?: A Little Help needed to walk in hospital room?: A Lot Help needed climbing 3-5 steps with a railing? : Total 6 Click Score: 15    End of Session Equipment Utilized During Treatment: Gait belt;Oxygen Activity Tolerance: Patient limited by fatigue Patient left: in bed;with call bell/phone within reach;with bed alarm set;Other (comment)(Pt declined up in chair) Nurse Communication: Mobility status PT Visit Diagnosis: Muscle weakness (generalized) (M62.81);Difficulty in walking, not elsewhere classified (R26.2);Pain;Unsteadiness on feet (R26.81);History of falling (Z91.81) Pain - Right/Left: Right Pain - part of body: (wrist)    Time: [id number] PT Time Calculation (min) (ACUTE ONLY): 34 min   Charges:   PT Evaluation $PT Eval Moderate Complexity: 1 Mod PT Treatments $Therapeutic Exercise: 8-22 mins       D. Royetta Asal PT, DPT 05/29/19, 5:38 PM

## 2019-05-29 NOTE — Plan of Care (Signed)
Patient is received from PACU to 2 A 232 with the diagnosis of acute respiratory failure due to covid 19. A & O x 4. Patient denied any acute pain. The patient is weaned frorn non rebreather face  mask to HFNC at  6 L . The patient is oriented to his room, call bell/ascom and staff. Full assessment to epic completed. Will continue to monitor.

## 2019-05-29 NOTE — Progress Notes (Signed)
Initial Nutrition Assessment  DOCUMENTATION CODES:   Not applicable  INTERVENTION:  Recommend liberalizing to regular diet.  Provide Ensure Enlive po BID, each supplement provides 350 kcal and 20 grams of protein.  NUTRITION DIAGNOSIS:   Increased nutrient needs related to catabolic [id number] as evidenced by estimated needs.  GOAL:   Patient will meet greater than or equal to 90% of their needs  MONITOR:   PO intake, Supplement acceptance, Labs, Weight trends, I & O's, Skin  REASON FOR ASSESSMENT:   Malnutrition Screening Tool    ASSESSMENT:   64 year old male with PMHx of Parkinson's disease, HTN, HLD, anxiety, PVD, depression, A-fib admitted with COVID-19, multifocal PNA, right radial fracture s/p ORIF on 5/25.   Spoke with patient over the phone. He reports he has had a decreased appetite for the past 2 weeks. He has still been eating meals but reports he was eating less than usual. He reports his appetite is improved now and he ate well at lunch today. Patient reports he did not have breakfast because it was cold by the time he received the tray. For lunch he had about 66% of his meal. Discussed catabolic nature of [id number] and increased calorie/protein needs. Patient is amenable to drinking Ensure to help meet increased needs. He would also benefit from a liberalized diet.  Patient reports his UBW was around 130 lbs and that he has lost down to approximately 125 lbs over the last two weeks. That is a weight loss of approximately 5 lbs (3.8% body weight) over the past 2 weeks, which is significant for time frame. There is a weight entered of 61.2 kg on 05/27/2019 but highly unlikely that patient lost 5 kg in two days. The weight from 5/24 was likely a stated weight.  Medications reviewed and include: vitamin C 500 mg daily, vitamin D3 1000 units daily, Decadron 6 mg Q24hrs IV, famotidine, folic acid 1 mg daily, lisinopril, MVI daily, Protonix, thiamine 100 mg daily,  vitamin B12 100 micrograms daily, zinc sulfate 220 mg daily, NS at 75 mL/hr, azithromycin, ceftriaxone, remdesivir.  Labs reviewed: Sodium 130, Chloride 96, Creatinine 0.54, Magnesium 1.4.  Patient is at risk for malnutrition.  NUTRITION - FOCUSED PHYSICAL EXAM:  Deferred.  Diet Order:   Diet Order            Diet Heart Room service appropriate? Yes; Fluid consistency: Thin  Diet effective now             EDUCATION NEEDS:   No education needs have been identified at this time  Skin:  Skin Assessment: Skin Integrity Issues:(closed incision right arm)  Last BM:  Unknown  Height:   Ht Readings from Last 1 Encounters:  05/28/19 5\' 6"  (1.676 m)   Weight:   Wt Readings from Last 1 Encounters:  05/29/19 56 kg   Ideal Body Weight:  64.5 kg  BMI:  Body mass index is 19.93 kg/m.  Estimated Nutritional Needs:   Kcal:  1700-1900  Protein:  85-95 grams  Fluid:  1.7-1.9 L/day  Jacklynn Barnacle, MS, RD, LDN Pager number available on Amion

## 2019-05-29 NOTE — Progress Notes (Signed)
PROGRESS NOTE    Charles Stanton  [id number] DOB: Jun 03, 1955 DOA: 05/28/2019 PCP: [name], MD    Brief Narrative:  Charles Stanton  is a 64 y.o. Caucasian male with a known history of Parkinson's disease, hypertension, dyslipidemia, atrial fibrillation on Eliquis and peripheral vascular disease, who underwent ORIF of her right radius fracture this afternoon and postoperatively became hypoxemic with dyspnea and diminishing pulse oximetry from initially 90% on 2 L O2 to 89% requiring 6 L O2 to maintain a pulse oximetry of 94% given the fact that he is a mouth breather he was placed on nonrebreather mask.  The patient fell yesterday when he lost his balance and had subsequent right radius fracture.  He denies any fever at home but had low-grade fever of 99.6 here without chills.  He was having active tremors during my interview.  He admitted to having cough that is mainly dry and occasionally productive of whitish sputum over the last week without wheezing or dyspnea.  No chest pain or palpitations.  No nausea or vomiting or diarrhea.  He lost smell with Parkinson's about 5 years ago.  No dysuria, oliguria or hematuria or flank pain.Stat portable chest Cerino showed bibasal opacities right worse than left that may include atelectasis or infection.    Consultants:     Procedures:   Antimicrobials:   Ceftriaxone remdesivir   Subjective: C/o rt hand pain. No other complaints  Objective: Vitals:   05/28/19 2257 05/29/19 0610 05/29/19 0731 05/29/19 1057  BP:  93/65 102/72 122/80  Pulse:  86 77 82  Resp:   18 18  Temp:  98.5 F (36.9 C) 98 F (36.7 C) 98.4 F (36.9 C)  TempSrc:  Oral Oral Oral  SpO2: 97% 99% 100% 100%  Weight:  56 kg    Height:        Intake/Output Summary (Last 24 hours) at 05/29/2019 1533 Last data filed at 05/29/2019 1045 Gross per 24 hour  Intake 1524.51 ml  Output 1055 ml  Net 469.51 ml   Filed Weights   05/28/19 2201 05/29/19 0610  Weight: 57.4 kg  56 kg    Examination:  General exam: Appears calm and comfortable  Respiratory system: Bibasilar scattered crackles, no wheezing Cardiovascular system: S1 & S2 heard, RRR. No JVD, murmurs, rubs, gallops or clicks.  Gastrointestinal system: Abdomen is nondistended, soft and nontender.. Normal bowel sounds heard. Central nervous system: Alert and oriented. No focal neurological deficits. Extremities: No edema Skin: Warm dry Psychiatry:  Mood & affect appropriate in current setting.     Data Reviewed: I have personally reviewed following labs and imaging studies  CBC: Recent Labs  Lab 05/27/19 1421 05/28/19 2036 05/29/19 0634  WBC 9.0 4.1 8.8  NEUTROABS 7.5 3.7 8.0*  HGB 13.7 13.4 12.0*  HCT 40.9 39.4 35.4*  MCV 92.3 93.1 91.9  PLT 228 155 [id number]*   Basic Metabolic Panel: Recent Labs  Lab 05/27/19 1421 05/28/19 2036 05/29/19 0634  NA 137 133* 130*  K 4.1 3.8 3.9  CL 101 96* 96*  CO2 22 24 23   GLUCOSE 87 102* 100*  BUN 5* 9 9  CREATININE 0.44* 0.57* 0.54*  CALCIUM 8.6* 8.6* 8.2*  MG  --   --  1.4*   GFR: Estimated Creatinine Clearance: 74.9 mL/min (A) (by C-G formula based on SCr of 0.54 mg/dL (L)). Liver Function Tests: Recent Labs  Lab 05/27/19 1421 05/28/19 2036 05/29/19 0634  AST 54* 40 34  ALT 23 20 5  ALKPHOS 146* 125 85  BILITOT 0.8 1.9* 1.5*  PROT 6.4* 6.2* 5.5*  ALBUMIN 3.5 3.4* 2.9*   No results for input(s): LIPASE, AMYLASE in the last 168 hours. No results for input(s): AMMONIA in the last 168 hours. Coagulation Profile: Recent Labs  Lab 05/28/19 2036  INR 1.1   Cardiac Enzymes: No results for input(s): CKTOTAL, CKMB, CKMBINDEX, TROPONINI in the last 168 hours. BNP (last 3 results) No results for input(s): PROBNP in the last 8760 hours. HbA1C: No results for input(s): HGBA1C in the last 72 hours. CBG: No results for input(s): GLUCAP in the last 168 hours. Lipid Profile: No results for input(s): CHOL, HDL, LDLCALC, TRIG, CHOLHDL,  LDLDIRECT in the last 72 hours. Thyroid Function Tests: No results for input(s): TSH, T4TOTAL, FREET4, T3FREE, THYROIDAB in the last 72 hours. Anemia Panel: Recent Labs    05/28/19 2036 05/29/19 0634  FERRITIN 132 143   Sepsis Labs: Recent Labs  Lab 05/28/19 2036 05/29/19 0634  PROCALCITON 6.68 32.00    Recent Results (from the past 240 hour(s))  SARS Coronavirus 2 by RT PCR (hospital order, performed in [facility] hospital lab) Nasopharyngeal Nasopharyngeal Swab     Status: Abnormal   Collection Time: 05/27/19  3:36 PM   Specimen: Nasopharyngeal Swab  Result Value Ref Range Status   SARS Coronavirus 2 POSITIVE (A) NEGATIVE Final    Comment: RESULT CALLED TO, READ BACK BY AND VERIFIED WITH: HEATHER FISHER RN AT [id number] ON 05/27/19 SNG (NOTE) SARS-CoV-2 target nucleic acids are DETECTED SARS-CoV-2 RNA is generally detectable in upper respiratory specimens  during the acute phase of infection.  Positive results are indicative  of the presence of the identified virus, but do not rule out bacterial infection or co-infection with other pathogens not detected by the test.  Clinical correlation with patient history and  other diagnostic information is necessary to determine patient infection status.  The expected result is negative. Fact Sheet for Patients:   StrictlyIdeas.no  Fact Sheet for Healthcare Providers:   BankingDealers.co.za   This test is not yet approved or cleared by the [place] FDA and  has been authorized for detection and/or diagnosis of SARS-CoV-2 by FDA under an Emergency Use Authorization (EUA).  This EUA will remain in effect (meaning this test  can be used) for the duration of  the COVID-19 declaration under Section 564(b)(1) of the Act, 21 U.S.C. section 360-bbb-3(b)(1), unless the authorization is terminated or revoked sooner. Performed at [facility], [name] [place], [place]  [address]          Radiology Studies: DG Wrist 2 Views Right  Result Date: 05/28/2019 CLINICAL DATA:  Status post ORIF EXAM: RIGHT WRIST - 2 VIEW COMPARISON:  05/27/2019 FINDINGS: Interval surgical plate and multiple screw fixation of comminuted intra-articular distal radius fracture with decreased displacement and angulation. Acute displaced ulnar styloid process fracture. IMPRESSION: 1. Interval internal fixation of distal radius fracture 2. Acute displaced ulnar styloid process fracture Electronically Signed   By: Donavan Foil M.D.   On: 05/28/2019 19:18   DG Chest Port 1 View  Result Date: 05/28/2019 CLINICAL DATA:  Hypoxia EXAM: PORTABLE CHEST 1 VIEW COMPARISON:  01/23/2018 FINDINGS: Bibasilar opacities, right worse than left. No pleural effusion or pneumothorax. Intermediate sized hiatal hernia. IMPRESSION: Bibasilar opacities, right worse than left, which may indicate atelectasis or infection. Electronically Signed   By: Ulyses Jarred M.D.   On: 05/28/2019 19:48   [place] OR NERVE BLOCK-IMAGE ONLY [facility])  Result Date: 05/28/2019 There is no interpretation for this exam.  This order is for images obtained during a surgical procedure.  Please See "Surgeries" Tab for more information regarding the procedure.   DG MINI C-ARM IMAGE ONLY  Result Date: 05/28/2019 There is no interpretation for this exam.  This order is for images obtained during a surgical procedure.  Please See "Surgeries" Tab for more information regarding the procedure.        Scheduled Meds: . vitamin C  500 mg Oral Daily  . aspirin EC  81 mg Oral Daily  . baclofen  10 mg Oral Daily  . buPROPion  150 mg Oral Daily  . carbidopa-levodopa  1 tablet Oral QHS  . carbidopa-levodopa  1 tablet Oral QID  . cholecalciferol  1,000 Units Oral Daily  . dexamethasone (DECADRON) injection  6 mg Intravenous Q24H  . enoxaparin (LOVENOX) injection  40 mg Subcutaneous Q24H  . famotidine  20 mg Oral BID  . feeding supplement (ENSURE  ENLIVE)  237 mL Oral BID BM  . folic acid  1 mg Oral Daily  . gabapentin  200 mg Oral BID  . guaiFENesin  600 mg Oral BID  . lisinopril  20 mg Oral QHS  . multivitamin with minerals  1 tablet Oral Daily  . pantoprazole  40 mg Oral BID  . sertraline  50 mg Oral Daily  . thiamine  100 mg Oral Daily  . vitamin B-12  100 mcg Oral Daily  . zinc sulfate  220 mg Oral Daily   Continuous Infusions: . sodium chloride 75 mL/hr at 05/29/19 1026  . sodium chloride    . azithromycin 500 mg (05/29/19 1104)  . cefTRIAXone (ROCEPHIN)  IV 2 g (05/29/19 1015)  . remdesivir 100 mg in NS 100 mL 100 mg (05/29/19 1345)    Assessment & Plan:   Active Problems:   Acute respiratory failure due to COVID-19 ([place])   1. Acute hypoxemic respiratory failure secondary to COVID-19. -The patient will be admitted to a medically monitored isolation bed. -O2 protocol will be followed to keep O2 saturation above 93. Continue on IV steroids and IV antibiotic  2. Multifocal pneumonia secondary to COVID-19. -Continue droplet and contact precautions. -Given multifocal pneumonia we will empirically place the patient on IV Rocephin and Zithromax for possible bacterial superinfection only with elevated Procalcitonin. PRCalc more elevated today -Continue Mucinex and as needed Tussionex. -sputum Gram stain culture and sensitivity and follow blood cultures. -O2 protocol will be followed. - will follow CRP, ferritin, LDH and D-dimer.CRP higher today -Will follow manual differential for ANC/ALC ratio as well as follow troponin I and daily CBC with manual differential and CMP. - Continue IV Remdesivir and IV steroid therapy with Decadron with elevated inflammatory markers. -Continue vitamin D3, vitamin C, zinc sulfate, p.o. Pepcid and aspirin. -Actemra can be considered for CRP more than 7 with associated hypoxemia. Plan: Will obtain CTA as procal elevated, elevated fibrin , requiring more 02  3.  Parkinson's disease  with current resting tremors. - continue his Sinemet.  4.  Hypertensive urgency. - continue Zestril and place the patient on as needed IV labetalol.  5.  GERD. -continue PPI therapy.  6.  Depression. -We will continue Zoloft and Wellbutrin XL.  7.  Peripheral neuropathy. -continue Neurontin.  8.  Chronic atrial fibrillation. -Given history of recurrent falls, she is on no anticoagulation.  9.  Status post right radius ORIF. -Pain management per Ortho. Start oxycodone prn  DVT prophylaxis: Lovenox Code Status: Full Family Communication: None at bedside Disposition Plan: Back to previous home life Barrier: Patient requiring IV antibiotics and IV steroids, currently not stable medically to be discharged in safe planning needs to be done likely DC in couple of days. Still requiring high 02       LOS: 1 day   Time spent: 45 minutes with more than 50% COC    Nolberto Hanlon, MD [facility] Pager [phone]  If 7PM-7AM, please contact night-coverage [email or web address] Password [facility] 05/29/2019, 3:33 PM

## 2019-05-30 LAB — CBC WITH DIFFERENTIAL/PLATELET
Abs Immature Granulocytes: 0.1 10*3/uL — ABNORMAL HIGH (ref 0.00–0.07)
Basophils Absolute: 0 10*3/uL (ref 0.0–0.1)
Basophils Relative: 0 %
Eosinophils Absolute: 0 10*3/uL (ref 0.0–0.5)
Eosinophils Relative: 0 %
HCT: 36.2 % — ABNORMAL LOW (ref 39.0–52.0)
Hemoglobin: 11.8 g/dL — ABNORMAL LOW (ref 13.0–17.0)
Immature Granulocytes: 1 %
Lymphocytes Relative: 4 %
Lymphs Abs: 0.4 10*3/uL — ABNORMAL LOW (ref 0.7–4.0)
MCH: 31.3 pg (ref 26.0–34.0)
MCHC: 32.6 g/dL (ref 30.0–36.0)
MCV: 96 fL (ref 80.0–100.0)
Monocytes Absolute: 0.4 10*3/uL (ref 0.1–1.0)
Monocytes Relative: 4 %
Neutro Abs: 7.7 10*3/uL (ref 1.7–7.7)
Neutrophils Relative %: 91 %
Platelets: 135 10*3/uL — ABNORMAL LOW (ref 150–400)
RBC: 3.77 MIL/uL — ABNORMAL LOW (ref 4.22–5.81)
RDW: 14.3 % (ref 11.5–15.5)
WBC: 8.6 10*3/uL (ref 4.0–10.5)
nRBC: 0 % (ref 0.0–0.2)

## 2019-05-30 LAB — COMPREHENSIVE METABOLIC PANEL
ALT: 8 U/L (ref 0–44)
AST: 31 U/L (ref 15–41)
Albumin: 2.8 g/dL — ABNORMAL LOW (ref 3.5–5.0)
Alkaline Phosphatase: 84 U/L (ref 38–126)
Anion gap: 9 (ref 5–15)
BUN: 16 mg/dL (ref 8–23)
CO2: 26 mmol/L (ref 22–32)
Calcium: 8.3 mg/dL — ABNORMAL LOW (ref 8.9–10.3)
Chloride: 100 mmol/L (ref 98–111)
Creatinine, Ser: 0.45 mg/dL — ABNORMAL LOW (ref 0.61–1.24)
GFR calc Af Amer: >60 mL/min (ref >60)
GFR calc non Af Amer: >60 mL/min (ref >60)
Glucose, Bld: 129 mg/dL — ABNORMAL HIGH (ref 70–99)
Potassium: 4 mmol/L (ref 3.5–5.1)
Sodium: 135 mmol/L (ref 135–145)
Total Bilirubin: 1.2 mg/dL (ref 0.3–1.2)
Total Protein: 5.6 g/dL — ABNORMAL LOW (ref 6.5–8.1)

## 2019-05-30 LAB — PTT FACTOR INHIBITOR (MIXING STUDY): aPTT: 23.7 s (ref 22.9–30.2)

## 2019-05-30 LAB — MAGNESIUM: Magnesium: 1.9 mg/dL (ref 1.7–2.4)

## 2019-05-30 LAB — C-REACTIVE PROTEIN: CRP: 20.9 mg/dL — ABNORMAL HIGH (ref <1.0)

## 2019-05-30 LAB — FERRITIN: Ferritin: 162 ng/mL (ref 24–336)

## 2019-05-30 LAB — PROCALCITONIN: Procalcitonin: 18.5 ng/mL

## 2019-05-30 LAB — FIBRIN DERIVATIVES D-DIMER (ARMC ONLY): Fibrin derivatives D-dimer (ARMC): 2382.63 ng/mL (FEU) — ABNORMAL HIGH (ref 0.00–499.00)

## 2019-05-30 MED ORDER — SODIUM CHLORIDE 0.9% FLUSH
3.0000 mL | Freq: Two times a day (BID) | INTRAVENOUS | Status: DC
  Administered 2019-05-30 – 2019-06-01 (×4): 3 mL via INTRAVENOUS

## 2019-05-30 MED ORDER — SENNA 8.6 MG PO TABS: 1.0000 | ORAL_TABLET | Freq: Every evening | ORAL | Status: DC | PRN

## 2019-05-30 MED ORDER — LISINOPRIL 10 MG PO TABS: 10.0000 mg | ORAL_TABLET | Freq: Every day | ORAL | Status: DC

## 2019-05-30 MED ORDER — DOCUSATE SODIUM 100 MG PO CAPS
100.0000 mg | ORAL_CAPSULE | Freq: Every day | ORAL | Status: DC | PRN
  Administered 2019-05-31: 100 mg via ORAL
  Filled 2019-05-30: qty 1

## 2019-05-30 NOTE — Progress Notes (Addendum)
PROGRESS NOTE    Charles Stanton  [id number] DOB: 1955-02-10 DOA: 05/28/2019 PCP: [name], MD    Brief Narrative:  Charles Stanton  is a 64 y.o. Caucasian male with a known history of Parkinson's disease, hypertension, dyslipidemia, atrial fibrillation on Eliquis and peripheral vascular disease, who underwent ORIF of her right radius fracture this afternoon and postoperatively became hypoxemic with dyspnea and diminishing pulse oximetry from initially 90% on 2 L O2 to 89% requiring 6 L O2 to maintain a pulse oximetry of 94% given the fact that he is a mouth breather he was placed on nonrebreather mask.  The patient fell yesterday when he lost his balance and had subsequent right radius fracture.  He denies any fever at home but had low-grade fever of 99.6 here without chills.  He was having active tremors during my interview.  He admitted to having cough that is mainly dry and occasionally productive of whitish sputum over the last week without wheezing or dyspnea.  No chest pain or palpitations.  No nausea or vomiting or diarrhea.  He lost smell with Parkinson's about 5 years ago.  No dysuria, oliguria or hematuria or flank pain.Stat portable chest Shipper showed bibasal opacities right worse than left that may include atelectasis or infection.    Consultants:     Procedures:   Antimicrobials:   Ceftriaxone remdesivir   Subjective: No complaints of Rt hand today. Reports feeling better, less sob. No n/v/d+constipation  Objective: Vitals:   05/29/19 2028 05/30/19 0253 05/30/19 0400 05/30/19 0804  BP: 102/70 109/69  109/78  Pulse: 84 (!) 101  75  Resp:   20 17  Temp: 98.1 F (36.7 C) 98.3 F (36.8 C)  98.3 F (36.8 C)  TempSrc: Oral   Oral  SpO2: 97% 99%  98%  Weight:      Height:        Intake/Output Summary (Last 24 hours) at 05/30/2019 0849 Last data filed at 05/30/2019 0700 Gross per 24 hour  Intake 2068.78 ml  Output 1500 ml  Net 568.78 ml   Filed Weights   05/28/19 2201 05/29/19 0610  Weight: 57.4 kg 56 kg    Examination: General exam: Appears calm and comfortable , nad nontachpnic while talking Respiratory system: Bibasilar scattered crackles, no wheezing or rhonchi Cardiovascular system: S1 & S2 heard, RRR. No JVD, murmurs, rubs, gallops or clicks.  Gastrointestinal system: Abdomen is nondistended, soft and nontender.. Normal bowel sounds heard. Central nervous system: Alert and oriented. Grossly intact, +fine tremors UE Extremities: No edema Skin: Warm dry Psychiatry:  Mood & affect appropriate in current setting.     Data Reviewed: I have personally reviewed following labs and imaging studies  CBC: Recent Labs  Lab 05/27/19 1421 05/28/19 2036 05/29/19 0634 05/30/19 0623  WBC 9.0 4.1 8.8 8.6  NEUTROABS 7.5 3.7 8.0* 7.7  HGB 13.7 13.4 12.0* 11.8*  HCT 40.9 39.4 35.4* 36.2*  MCV 92.3 93.1 91.9 96.0  PLT 228 155 133* [id number]*   Basic Metabolic Panel: Recent Labs  Lab 05/27/19 1421 05/28/19 2036 05/29/19 0634 05/30/19 0623  NA 137 133* 130* 135  K 4.1 3.8 3.9 4.0  CL 101 96* 96* 100  CO2 22 24 23 26   GLUCOSE 87 102* 100* 129*  BUN 5* 9 9 16   CREATININE 0.44* 0.57* 0.54* 0.45*  CALCIUM 8.6* 8.6* 8.2* 8.3*  MG  --   --  1.4* 1.9   GFR: Estimated Creatinine Clearance: 74.9 mL/min (A) (by C-G formula based  on SCr of 0.45 mg/dL (L)). Liver Function Tests: Recent Labs  Lab 05/27/19 1421 05/28/19 2036 05/29/19 0634 05/30/19 0623  AST 54* 40 34 31  ALT 23 20 5 8   ALKPHOS 146* 125 85 84  BILITOT 0.8 1.9* 1.5* 1.2  PROT 6.4* 6.2* 5.5* 5.6*  ALBUMIN 3.5 3.4* 2.9* 2.8*   No results for input(s): LIPASE, AMYLASE in the last 168 hours. No results for input(s): AMMONIA in the last 168 hours. Coagulation Profile: Recent Labs  Lab 05/28/19 2036  INR 1.1   Cardiac Enzymes: No results for input(s): CKTOTAL, CKMB, CKMBINDEX, TROPONINI in the last 168 hours. BNP (last 3 results) No results for input(s): PROBNP in the  last 8760 hours. HbA1C: No results for input(s): HGBA1C in the last 72 hours. CBG: No results for input(s): GLUCAP in the last 168 hours. Lipid Profile: No results for input(s): CHOL, HDL, LDLCALC, TRIG, CHOLHDL, LDLDIRECT in the last 72 hours. Thyroid Function Tests: No results for input(s): TSH, T4TOTAL, FREET4, T3FREE, THYROIDAB in the last 72 hours. Anemia Panel: Recent Labs    05/29/19 0634 05/30/19 0623  FERRITIN 143 162   Sepsis Labs: Recent Labs  Lab 05/28/19 2036 05/29/19 0634 05/30/19 0623  PROCALCITON 6.68 32.00 18.50    Recent Results (from the past 240 hour(s))  SARS Coronavirus 2 by RT PCR (hospital order, performed in [facility] hospital lab) Nasopharyngeal Nasopharyngeal Swab     Status: Abnormal   Collection Time: 05/27/19  3:36 PM   Specimen: Nasopharyngeal Swab  Result Value Ref Range Status   SARS Coronavirus 2 POSITIVE (A) NEGATIVE Final    Comment: RESULT CALLED TO, READ BACK BY AND VERIFIED WITH: HEATHER FISHER RN AT [id number] ON 05/27/19 SNG (NOTE) SARS-CoV-2 target nucleic acids are DETECTED SARS-CoV-2 RNA is generally detectable in upper respiratory specimens  during the acute phase of infection.  Positive results are indicative  of the presence of the identified virus, but do not rule out bacterial infection or co-infection with other pathogens not detected by the test.  Clinical correlation with patient history and  other diagnostic information is necessary to determine patient infection status.  The expected result is negative. Fact Sheet for Patients:   StrictlyIdeas.no  Fact Sheet for Healthcare Providers:   BankingDealers.co.za   This test is not yet approved or cleared by the [place] FDA and  has been authorized for detection and/or diagnosis of SARS-CoV-2 by FDA under an Emergency Use Authorization (EUA).  This EUA will remain in effect (meaning this test  can be used) for the duration of   the COVID-19 declaration under Section 564(b)(1) of the Act, 21 U.S.C. section 360-bbb-3(b)(1), unless the authorization is terminated or revoked sooner. Performed at [facility], [address] [place], [place] [address]          Radiology Studies: DG Wrist 2 Views Right  Result Date: 05/28/2019 CLINICAL DATA:  Status post ORIF EXAM: RIGHT WRIST - 2 VIEW COMPARISON:  05/27/2019 FINDINGS: Interval surgical plate and multiple screw fixation of comminuted intra-articular distal radius fracture with decreased displacement and angulation. Acute displaced ulnar styloid process fracture. IMPRESSION: 1. Interval internal fixation of distal radius fracture 2. Acute displaced ulnar styloid process fracture Electronically Signed   By: Donavan Foil M.D.   On: 05/28/2019 19:18   CT ANGIO CHEST PE W OR WO CONTRAST  Result Date: 05/29/2019 CLINICAL DATA:  Respiratory failure. EXAM: CT ANGIOGRAPHY CHEST WITH CONTRAST TECHNIQUE: Multidetector CT imaging of the chest was performed  using the standard protocol during bolus administration of intravenous contrast. Multiplanar CT image reconstructions and MIPs were obtained to evaluate the vascular anatomy. CONTRAST:  43mL OMNIPAQUE IOHEXOL 350 MG/ML SOLN COMPARISON:  Chest radiograph 05/28/2019 FINDINGS: Cardiovascular: Heart is enlarged. Trace fluid superior pericardial recess. Coronary arterial vascular calcifications. Thoracic aortic vascular calcifications. Adequate opacification of the pulmonary arterial system. No evidence for acute pulmonary embolus. Mediastinum/Nodes: No enlarged axillary, mediastinal or hilar lymphadenopathy. 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 Mild wall thickening of the esophagus. Lungs/Pleura: Central airways are patent. Patchy ground-glass and consolidative opacities demonstrated within the lower lobes bilaterally as well as within the right middle lobe and lingula. Patchy ground-glass opacities within the right upper  lobe. None no definite pleural effusion or pneumothorax. Upper Abdomen: The liver is diffusely low in attenuation compatible with steatosis. No acute process. Musculoskeletal: Thoracic spine degenerative changes. MultipleBilateral rib fractures. T9 kyphoplasty material. Multiple wedge compression deformities are demonstrated throughout the thoracic spine involving the T2, T3, T6, T7, T8, T11 and T12 vertebral bodies. Additionally, there is a compression deformity of the L1 and L2 vertebral bodies. Review of the MIP images confirms the above findings. IMPRESSION: 1. No evidence for acute pulmonary embolus. 2. Patchy ground-glass and consolidative opacities within the lower lobes bilaterally as well as within the right middle lobe and lingula concerning for pneumonia 3. 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 Mild wall thickening of the esophagus which may be secondary to esophagitis. 4. Multiple wedge compression deformities throughout the thoracic and lumbar spine. Many of these are chronic. Recommend correlation for point tenderness. 5. Hepatic steatosis. 6. Aortic atherosclerosis. Aortic Atherosclerosis (ICD10-I70.0). Electronically Signed   By: Lovey Newcomer M.D.   On: 05/29/2019 17:58   DG Chest Port 1 View  Result Date: 05/28/2019 CLINICAL DATA:  Hypoxia EXAM: PORTABLE CHEST 1 VIEW COMPARISON:  01/23/2018 FINDINGS: Bibasilar opacities, right worse than left. No pleural effusion or pneumothorax. Intermediate sized hiatal hernia. IMPRESSION: Bibasilar opacities, right worse than left, which may indicate atelectasis or infection. Electronically Signed   By: Ulyses Jarred M.D.   On: 05/28/2019 19:48   [place] OR NERVE BLOCK-IMAGE ONLY [facility])  Result Date: 05/28/2019 There is no interpretation for this exam.  This order is for images obtained during a surgical procedure.  Please See "Surgeries" Tab for more information regarding the procedure.   DG MINI C-ARM IMAGE ONLY  Result Date: 05/28/2019 There is no  interpretation for this exam.  This order is for images obtained during a surgical procedure.  Please See "Surgeries" Tab for more information regarding the procedure.        Scheduled Meds: . vitamin C  500 mg Oral Daily  . aspirin EC  81 mg Oral Daily  . baclofen  10 mg Oral Daily  . buPROPion  150 mg Oral Daily  . carbidopa-levodopa  1 tablet Oral QHS  . carbidopa-levodopa  1 tablet Oral QID  . cholecalciferol  1,000 Units Oral Daily  . dexamethasone (DECADRON) injection  6 mg Intravenous Q24H  . enoxaparin (LOVENOX) injection  40 mg Subcutaneous Q24H  . famotidine  20 mg Oral BID  . feeding supplement (ENSURE ENLIVE)  237 mL Oral BID BM  . folic acid  1 mg Oral Daily  . gabapentin  200 mg Oral BID  . guaiFENesin  600 mg Oral BID  . lisinopril  10 mg Oral QHS  . multivitamin with minerals  1 tablet Oral Daily  . pantoprazole  40 mg Oral  BID  . sertraline  50 mg Oral Daily  . thiamine  100 mg Oral Daily  . vitamin B-12  100 mcg Oral Daily  . zinc sulfate  220 mg Oral Daily   Continuous Infusions: . sodium chloride 75 mL/hr at 05/30/19 0210  . azithromycin 500 mg (05/30/19 0811)  . cefTRIAXone (ROCEPHIN)  IV 2 g (05/30/19 [id number])  . remdesivir 100 mg in NS 100 mL Stopped (05/29/19 1430)    Assessment & Plan:   Active Problems:   Acute respiratory failure due to COVID-19 ([place])   1. Acute hypoxemic respiratory failure secondary to COVID-19. -The patient will be admitted to a medically monitored isolation bed. -O2 protocol will be followed to keep O2 saturation above 93. Continue on IV steroids and IV antibiotic  2. Multifocal pneumonia secondary to COVID-19. -Continue droplet and contact precautions. Slowly improving. Continue  IV Rocephin and Zithromax for possible bacterial superinfection only with elevated Procalcitonin.  -Continue Mucinex and as needed Tussionex. -sputum Gram stain culture and sensitivity and follow blood cultures not seen yet. -O2 protocol  will be followed. -f/u inflammatory markers. - Continue IV Remdesivir and IV steroid therapy with Decadron with elevated inflammatory markers. -Continue vitamin D3, vitamin C, zinc sulfate, p.o. Pepcid and aspirin. CTA neg. For PE -Actemra can be considered for CRP more than 7 with associated hypoxemia. -wean down oxygen  3.  Parkinson's disease with current resting tremors. - continue his Sinemet.  4.  Hypertensive urgency. Improved, now on lower side, will hold lisinopril for now  5.  GERD. -continue PPI therapy.  6.  Depression. -We will continue Zoloft and Wellbutrin XL.  7.  Peripheral neuropathy. -continue Neurontin.  8.  Chronic atrial fibrillation. -Given history of recurrent falls, she is on no anticoagulation.  9.  Status post right radius ORIF. -Pain management per Ortho. Start oxycodone prn     DVT prophylaxis: Lovenox Code Status: Full Family Communication: None at bedside Disposition Plan: Back to previous home life Barrier: Patient requiring IV antibiotics and IV steroids, currently not stable medically to be discharged in safe planning needs to be done likely DC in couple of days.        LOS: 2 days   Time spent: 45 minutes with more than 50% COC    Nolberto Hanlon, MD [facility] Pager [phone]  If 7PM-7AM, please contact night-coverage [email or web address] Password [facility] 05/30/2019, 8:49 AM Patient ID: [name], male   DOB: [birth year], 64 y.o.   MRN: [id number]

## 2019-05-30 NOTE — TOC Initial Note (Addendum)
Transition of Care [facility]) - Initial/Assessment Note    Patient Details  Name: Charles Stanton MRN: [id number] Date of Birth: 02-10-1955  [facility] of [facility] CM/SW Contact:    Eileen Stanford, LCSW Phone Number: 05/30/2019, 2:17 PM  Clinical Narrative:      CSW spoke with pt via telephone. Pt is on airborne precautions for COVID. Pt states he does not need to go to SNF. Pt states he already has [place] at home.  Pt could not recall which agency provides the [facility]. Pt suggest CSW call his wife. CSW spoke with pt's wife and she states pt was getting HH through [facility]. Pt's wife is comfortable with pt returning home with home health.  Pt's wife states pt has a cane, walker, and bedside commode.        Expected Discharge Plan: [place] Barriers to Discharge: Continued Medical Work up   Patient Goals and CMS Choice Patient states their goals for this hospitalization and ongoing recovery are:: to get better   Choice offered to / list presented to : Patient  Expected Discharge Plan and Services Expected Discharge Plan: [place] In-house Referral: NA   Post Acute Care Choice: [place] arrangements for the past 2 months: Single Family Home                           HH Arranged: PT HH Agency: [place] Date [place]: 05/30/19 Time HH Agency Contacted: 1416 Representative spoke with at [place]: Malachy Mood  Prior Living Arrangements/Services Living arrangements for the past 2 months: [place] with:: Spouse Patient language and need for interpreter reviewed:: Yes Do you feel safe going back to the place where you live?: Yes      Need for Family Participation in Patient Care: Yes (Comment) Care giver support system in place?: Yes (comment)   Criminal Activity/Legal Involvement Pertinent to Current Situation/Hospitalization: No - Comment as needed  Activities of Daily Living Home Assistive  Devices/Equipment: Dentures (specify type), Eyeglasses, Walker (specify type), Wheelchair ADL Screening (condition at time of admission) Patient's cognitive ability adequate to safely complete daily activities?: Yes Is the patient deaf or have difficulty hearing?: No Does the patient have difficulty seeing, even when wearing glasses/contacts?: No Does the patient have difficulty concentrating, remembering, or making decisions?: No Patient able to express need for assistance with ADLs?: Yes Does the patient have difficulty dressing or bathing?: Yes Independently performs ADLs?: No Communication: Independent Dressing (OT): Needs assistance Is this a change from baseline?: Pre-admission baseline Grooming: Needs assistance Is this a change from baseline?: Pre-admission baseline Feeding: Independent Bathing: Needs assistance Is this a change from baseline?: Pre-admission baseline Toileting: Needs assistance Is this a change from baseline?: Pre-admission baseline In/Out Bed: Needs assistance Is this a change from baseline?: Pre-admission baseline Walks in Home: Needs assistance Is this a change from baseline?: Pre-admission baseline Does the patient have difficulty walking or climbing stairs?: Yes Weakness of Legs: Both Weakness of Arms/Hands: Both  Permission Sought/Granted Permission sought to share information with : Family Supports Permission granted to share information with : Yes, Verbal Permission Granted  Share Information with NAME: Sharyn Lull  Permission granted to share info w AGENCY: [facility]  Permission granted to share info w Relationship: spouse     Emotional Assessment Appearance:: Appears stated age Attitude/Demeanor/Rapport: Engaged Affect (typically observed): Accepting, Appropriate Orientation: : Oriented to Self, Oriented to  Place, Oriented to  Time, Oriented to Situation Alcohol / Substance Use: Not Applicable Psych Involvement: No (comment)  Admission  diagnosis:  Acute respiratory failure due to COVID-19 ([place]) [U07.1, J96.00] Patient Active Problem List   Diagnosis Date Noted  . Acute respiratory failure due to COVID-19 ([place]) 05/28/2019  . Alcohol dependence with uncomplicated withdrawal ([employer]) 01/23/2018  . Alcohol withdrawal ([place]) 01/23/2018  . GIB (gastrointestinal bleeding) 12/09/2017  . A-fib ([place]) 12/09/2017  . Esophageal obstruction 10/10/2016  . Esophageal ulceration 10/10/2016  . GI bleed 09/20/2016  . Low vitamin B12 level 03/18/2016  . S/P kyphoplasty 03/18/2016  . Basal cell carcinoma 02/08/2016  . Atrial fibrillation ([place]) 01/27/2016  . Colitis 01/27/2016  . HLD (hyperlipidemia) 01/27/2016  . Anxiety 01/27/2016  . Hypokalemia 01/27/2016  . Major depressive disorder, recurrent episode, moderate ([place]) 04/14/2015  . Anxiety, generalized 12/06/2013  . Parkinson disease ([place]) 08/07/2013  . Dysphagia 05/12/2013  . Esophageal mass 05/12/2013  . GERD (gastroesophageal reflux disease) 05/12/2013  . Vomiting 05/12/2013  . Open bite of lower leg 05/10/2013  . Dog bite of lower leg 05/10/2013  . Abnormal finding on liver function 03/25/2013  . Benign neoplasm 03/25/2013  . Dizziness 03/25/2013  . Type 2 diabetes mellitus ([place]) 03/25/2013  . BP (high blood pressure) 03/25/2013  . Awareness of heartbeats 03/25/2013  . Pure hypercholesterolemia 03/25/2013  . Infective tonsillitis 03/25/2013  . Adenomatous polyp 03/25/2013   PCP:  [name], MD Pharmacy:   [facility] [phone] [address], [place] [address] Phone: [phone] Fax: [phone]     Social Determinants of Health (SDOH) Interventions    Readmission Risk Interventions No flowsheet data found.

## 2019-05-30 NOTE — Progress Notes (Signed)
Physical Therapy Treatment Patient Details Name: Charles Stanton MRN: [id number] DOB: 06/16/1955 Today's Date: 05/30/2019    History of Present Illness Per MD notes: Pt is a 64 y.o. male with a PMH that includes: Parkinson's disease, hypertension, dyslipidemia, atrial fibrillation, and peripheral vascular disease, who underwent ORIF of his right radius fracture after recent fall.  Pt postoperatively became hypoxemic with dyspnea and diminishing pulse oximetry from initially requiring 2LO2 to requiring 6LO2.  MD assessment includes: Acute hypoxemic respiratory failure and multifocal pneumonia secondary to COVID-19, right distal radius ORIF, and hypertensive urgency.    PT Comments    Pt ready for session.  Pt at rest without O2.  Sats 98% at rest.  Wall O2 is on 4 lpm.  Pt stated MD removed O2 earlier today.  Monitored sats during mobility and rest and remained stable at 98% on room air so O2 was left off after session.  He is able to get to EOB with min a x 1 mostly for cues not to use RUE to assist.  Steady in sitting.  Stood with min a x 1 to walker and he is able to walk in room 20' 40' and 20' with platform walker and min guard/assist at times to assist with walker and light balance deficits.  Tremors noted with activity.  Overall tolerated well and he was left in recliner at end of session with alarm on and needs in reach.    Pt remains unsafe for gait/mobility at this time given balance and extensive cues for sequencing and protection of RUE.  SNF remains appropriate to allow for improved mobility and a safe discharge home.   Follow Up Recommendations  SNF;Supervision for mobility/OOB     Equipment Recommendations  Other (comment);3in1 (PT)    Recommendations for Other Services       Precautions / Restrictions Precautions Precautions: Fall Restrictions Weight Bearing Restrictions: Yes RUE Weight Bearing: Non weight bearing    Mobility  Bed Mobility Overal bed mobility: Needs  Assistance Bed Mobility: Supine to Sit     Supine to sit: Min assist     General bed mobility comments: cues to not use RUE to assist.  Transfers Overall transfer level: Needs assistance Equipment used: Right platform walker Transfers: Sit to/from Stand Sit to Stand: Min assist;From elevated surface            Ambulation/Gait Ambulation/Gait assistance: Min guard;Min assist Gait Distance (Feet): 40 Feet Assistive device: Right platform walker Gait Pattern/deviations: Step-through pattern;Decreased step length - right;Decreased step length - left Gait velocity: decreased   General Gait Details: 68' , 40' 20' with min guard/assist for balance and occasionally to assist with walker.   Stairs             Wheelchair Mobility    Modified Rankin (Stroke Patients Only)       Balance Overall balance assessment: Needs assistance Sitting-balance support: Feet supported;Single extremity supported Sitting balance-Leahy Scale: Good     Standing balance support: Bilateral upper extremity supported Standing balance-Leahy Scale: Fair                              Cognition Arousal/Alertness: Awake/alert Behavior During Therapy: WFL for tasks assessed/performed Overall Cognitive Status: Within Functional Limits for tasks assessed  Exercises      General Comments        Pertinent Vitals/Pain Pain Assessment: No/denies pain    Home Living                      Prior Function            PT Goals (current goals can now be found in the care plan section) Progress towards PT goals: Progressing toward goals    Frequency    7X/week      PT Plan Current plan remains appropriate    Co-evaluation              AM-PAC PT "6 Clicks" Mobility   Outcome Measure  Help needed turning from your back to your side while in a flat bed without using bedrails?: A Little Help needed  moving from lying on your back to sitting on the side of a flat bed without using bedrails?: A Little Help needed moving to and from a bed to a chair (including a wheelchair)?: A Little Help needed standing up from a chair using your arms (e.g., wheelchair or bedside chair)?: A Little Help needed to walk in hospital room?: A Little Help needed climbing 3-5 steps with a railing? : A Lot 6 Click Score: 17    End of Session Equipment Utilized During Treatment: Gait belt Activity Tolerance: Patient tolerated treatment well Patient left: in chair;with call bell/phone within reach;with chair alarm set Nurse Communication: Mobility status Pain - Right/Left: Right     Time: 1105-1140 PT Time Calculation (min) (ACUTE ONLY): 35 min  Charges:  $Gait Training: 8-22 mins $Therapeutic Activity: 8-22 mins                    Chesley Noon, PTA 05/30/19, 11:56 AM

## 2019-05-31 LAB — CBC WITH DIFFERENTIAL/PLATELET
Abs Immature Granulocytes: 0.06 10*3/uL (ref 0.00–0.07)
Basophils Absolute: 0 10*3/uL (ref 0.0–0.1)
Basophils Relative: 0 %
Eosinophils Absolute: 0 10*3/uL (ref 0.0–0.5)
Eosinophils Relative: 0 %
HCT: 32.3 % — ABNORMAL LOW (ref 39.0–52.0)
Hemoglobin: 10.5 g/dL — ABNORMAL LOW (ref 13.0–17.0)
Immature Granulocytes: 1 %
Lymphocytes Relative: 5 %
Lymphs Abs: 0.4 10*3/uL — ABNORMAL LOW (ref 0.7–4.0)
MCH: 31.3 pg (ref 26.0–34.0)
MCHC: 32.5 g/dL (ref 30.0–36.0)
MCV: 96.1 fL (ref 80.0–100.0)
Monocytes Absolute: 0.4 10*3/uL (ref 0.1–1.0)
Monocytes Relative: 5 %
Neutro Abs: 6.7 10*3/uL (ref 1.7–7.7)
Neutrophils Relative %: 89 %
Platelets: 139 10*3/uL — ABNORMAL LOW (ref 150–400)
RBC: 3.36 MIL/uL — ABNORMAL LOW (ref 4.22–5.81)
RDW: 14.1 % (ref 11.5–15.5)
WBC: 7.5 10*3/uL (ref 4.0–10.5)
nRBC: 0 % (ref 0.0–0.2)

## 2019-05-31 LAB — FIBRIN DERIVATIVES D-DIMER (ARMC ONLY): Fibrin derivatives D-dimer (ARMC): 2043.21 ng/mL (FEU) — ABNORMAL HIGH (ref 0.00–499.00)

## 2019-05-31 LAB — COMPREHENSIVE METABOLIC PANEL
ALT: <5 U/L (ref 0–44)
AST: 22 U/L (ref 15–41)
Albumin: 2.4 g/dL — ABNORMAL LOW (ref 3.5–5.0)
Alkaline Phosphatase: 81 U/L (ref 38–126)
Anion gap: 6 (ref 5–15)
BUN: 17 mg/dL (ref 8–23)
CO2: 27 mmol/L (ref 22–32)
Calcium: 7.9 mg/dL — ABNORMAL LOW (ref 8.9–10.3)
Chloride: 105 mmol/L (ref 98–111)
Creatinine, Ser: 0.41 mg/dL — ABNORMAL LOW (ref 0.61–1.24)
GFR calc Af Amer: >60 mL/min (ref >60)
GFR calc non Af Amer: >60 mL/min (ref >60)
Glucose, Bld: 139 mg/dL — ABNORMAL HIGH (ref 70–99)
Potassium: 3.7 mmol/L (ref 3.5–5.1)
Sodium: 138 mmol/L (ref 135–145)
Total Bilirubin: 0.9 mg/dL (ref 0.3–1.2)
Total Protein: 5 g/dL — ABNORMAL LOW (ref 6.5–8.1)

## 2019-05-31 LAB — FERRITIN: Ferritin: 139 ng/mL (ref 24–336)

## 2019-05-31 LAB — MAGNESIUM: Magnesium: 1.8 mg/dL (ref 1.7–2.4)

## 2019-05-31 LAB — C-REACTIVE PROTEIN: CRP: 9.6 mg/dL — ABNORMAL HIGH (ref <1.0)

## 2019-05-31 MED ORDER — AZITHROMYCIN 250 MG PO TABS
500.0000 mg | ORAL_TABLET | Freq: Every day | ORAL | Status: DC
  Administered 2019-06-01: 500 mg via ORAL
  Filled 2019-05-31: qty 2

## 2019-05-31 NOTE — Progress Notes (Signed)
PROGRESS NOTE    Charles Stanton  [id number] DOB: 1955-03-01 DOA: 05/28/2019 PCP: [name], MD    Brief Narrative:  Charles Stanton  is a 64 y.o. Caucasian male with a known history of Parkinson's disease, hypertension, dyslipidemia, atrial fibrillation on Eliquis and peripheral vascular disease, who underwent ORIF of her right radius fracture this afternoon and postoperatively became hypoxemic with dyspnea and diminishing pulse oximetry from initially 90% on 2 L O2 to 89% requiring 6 L O2 to maintain a pulse oximetry of 94% given the fact that he is a mouth breather he was placed on nonrebreather mask.  The patient fell yesterday when he lost his balance and had subsequent right radius fracture.  He denies any fever at home but had low-grade fever of 99.6 here without chills.  He was having active tremors during my interview.  He admitted to having cough that is mainly dry and occasionally productive of whitish sputum over the last week without wheezing or dyspnea.  No chest pain or palpitations.  No nausea or vomiting or diarrhea.  He lost smell with Parkinson's about 5 years ago.  No dysuria, oliguria or hematuria or flank pain.Stat portable chest Chatterjee showed bibasal opacities right worse than left that may include atelectasis or infection.    Consultants:     Procedures:   Antimicrobials:   Ceftriaxone remdesivir   Subjective: No complaints, feeling better.  Objective: Vitals:   05/30/19 1635 05/30/19 2105 05/31/19 0252 05/31/19 0757  BP: 122/76 115/69 122/85 128/78  Pulse: 76 67 64 (!) 53  Resp: 18 16 18 16   Temp:  98.8 F (37.1 C) 98.9 F (37.2 C) 98.5 F (36.9 C)  TempSrc:      SpO2: 96% 97% 95% 98%  Weight:   58.6 kg   Height:        Intake/Output Summary (Last 24 hours) at 05/31/2019 0907 Last data filed at 05/31/2019 0300 Gross per 24 hour  Intake 1050 ml  Output 450 ml  Net 600 ml   Filed Weights   05/28/19 2201 05/29/19 0610 05/31/19 0252  Weight:  57.4 kg 56 kg 58.6 kg    Examination: General exam: Appears calm and comfortable , nad , sitting in recliner chair Respiratory system: cta , no w/r/r Cardiovascular system: S1 & S2 heard, RRR. No JVD, murmurs, rubs, gallops or clicks.  Gastrointestinal system: Abdomen is nondistended, soft and nontender.. Normal bowel sounds heard. Central nervous system: Alert and oriented. Grossly intact, +fine tremors UE b/l Extremities: No edema Skin: Warm dry Psychiatry:  Mood & affect appropriate in current setting.     Data Reviewed: I have personally reviewed following labs and imaging studies  CBC: Recent Labs  Lab 05/27/19 1421 05/28/19 2036 05/29/19 0634 05/30/19 0623 05/31/19 0617  WBC 9.0 4.1 8.8 8.6 7.5  NEUTROABS 7.5 3.7 8.0* 7.7 6.7  HGB 13.7 13.4 12.0* 11.8* 10.5*  HCT 40.9 39.4 35.4* 36.2* 32.3*  MCV 92.3 93.1 91.9 96.0 96.1  PLT 228 155 133* 135* [id number]*   Basic Metabolic Panel: Recent Labs  Lab 05/27/19 1421 05/28/19 2036 05/29/19 0634 05/30/19 0623 05/31/19 0617  NA 137 133* 130* 135 138  K 4.1 3.8 3.9 4.0 3.7  CL 101 96* 96* 100 105  CO2 22 24 23 26 27   GLUCOSE 87 102* 100* 129* 139*  BUN 5* 9 9 16 17   CREATININE 0.44* 0.57* 0.54* 0.45* 0.41*  CALCIUM 8.6* 8.6* 8.2* 8.3* 7.9*  MG  --   --  1.4* 1.9 1.8   GFR: Estimated Creatinine Clearance: 78.3 mL/min (A) (by C-G formula based on SCr of 0.41 mg/dL (L)). Liver Function Tests: Recent Labs  Lab 05/27/19 1421 05/28/19 2036 05/29/19 0634 05/30/19 0623 05/31/19 0617  AST 54* 40 34 31 22  ALT 23 20 5 8  <5  ALKPHOS 146* 125 85 84 81  BILITOT 0.8 1.9* 1.5* 1.2 0.9  PROT 6.4* 6.2* 5.5* 5.6* 5.0*  ALBUMIN 3.5 3.4* 2.9* 2.8* 2.4*   No results for input(s): LIPASE, AMYLASE in the last 168 hours. No results for input(s): AMMONIA in the last 168 hours. Coagulation Profile: Recent Labs  Lab 05/28/19 2036  INR 1.1   Cardiac Enzymes: No results for input(s): CKTOTAL, CKMB, CKMBINDEX, TROPONINI in the last  168 hours. BNP (last 3 results) No results for input(s): PROBNP in the last 8760 hours. HbA1C: No results for input(s): HGBA1C in the last 72 hours. CBG: No results for input(s): GLUCAP in the last 168 hours. Lipid Profile: No results for input(s): CHOL, HDL, LDLCALC, TRIG, CHOLHDL, LDLDIRECT in the last 72 hours. Thyroid Function Tests: No results for input(s): TSH, T4TOTAL, FREET4, T3FREE, THYROIDAB in the last 72 hours. Anemia Panel: Recent Labs    05/30/19 0623 05/31/19 0617  FERRITIN 162 139   Sepsis Labs: Recent Labs  Lab 05/28/19 2036 05/29/19 0634 05/30/19 0623  PROCALCITON 6.68 32.00 18.50    Recent Results (from the past 240 hour(s))  SARS Coronavirus 2 by RT PCR (hospital order, performed in [facility] hospital lab) Nasopharyngeal Nasopharyngeal Swab     Status: Abnormal   Collection Time: 05/27/19  3:36 PM   Specimen: Nasopharyngeal Swab  Result Value Ref Range Status   SARS Coronavirus 2 POSITIVE (A) NEGATIVE Final    Comment: RESULT CALLED TO, READ BACK BY AND VERIFIED WITH: HEATHER FISHER RN AT [id number] ON 05/27/19 SNG (NOTE) SARS-CoV-2 target nucleic acids are DETECTED SARS-CoV-2 RNA is generally detectable in upper respiratory specimens  during the acute phase of infection.  Positive results are indicative  of the presence of the identified virus, but do not rule out bacterial infection or co-infection with other pathogens not detected by the test.  Clinical correlation with patient history and  other diagnostic information is necessary to determine patient infection status.  The expected result is negative. Fact Sheet for Patients:   StrictlyIdeas.no  Fact Sheet for Healthcare Providers:   BankingDealers.co.za   This test is not yet approved or cleared by the [place] FDA and  has been authorized for detection and/or diagnosis of SARS-CoV-2 by FDA under an Emergency Use Authorization (EUA).  This EUA  will remain in effect (meaning this test  can be used) for the duration of  the COVID-19 declaration under Section 564(b)(1) of the Act, 21 U.S.C. section 360-bbb-3(b)(1), unless the authorization is terminated or revoked sooner. Performed at [facility], [address], [place], [place] [address]          Radiology Studies: CT ANGIO CHEST PE W OR WO CONTRAST  Result Date: 05/29/2019 CLINICAL DATA:  Respiratory failure. EXAM: CT ANGIOGRAPHY CHEST WITH CONTRAST TECHNIQUE: Multidetector CT imaging of the chest was performed using the standard protocol during bolus administration of intravenous contrast. Multiplanar CT image reconstructions and MIPs were obtained to evaluate the vascular anatomy. CONTRAST:  63mL OMNIPAQUE IOHEXOL 350 MG/ML SOLN COMPARISON:  Chest radiograph 05/28/2019 FINDINGS: Cardiovascular: Heart is enlarged. Trace fluid superior pericardial recess. Coronary arterial vascular calcifications. Thoracic aortic vascular calcifications. Adequate opacification of  the pulmonary arterial system. No evidence for acute pulmonary embolus. Mediastinum/Nodes: No enlarged axillary, mediastinal or hilar lymphadenopathy. 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 Mild wall thickening of the esophagus. Lungs/Pleura: Central airways are patent. Patchy ground-glass and consolidative opacities demonstrated within the lower lobes bilaterally as well as within the right middle lobe and lingula. Patchy ground-glass opacities within the right upper lobe. None no definite pleural effusion or pneumothorax. Upper Abdomen: The liver is diffusely low in attenuation compatible with steatosis. No acute process. Musculoskeletal: Thoracic spine degenerative changes. MultipleBilateral rib fractures. T9 kyphoplasty material. Multiple wedge compression deformities are demonstrated throughout the thoracic spine involving the T2, T3, T6, T7, T8, T11 and T12 vertebral bodies. Additionally, there is a compression  deformity of the L1 and L2 vertebral bodies. Review of the MIP images confirms the above findings. IMPRESSION: 1. No evidence for acute pulmonary embolus. 2. Patchy ground-glass and consolidative opacities within the lower lobes bilaterally as well as within the right middle lobe and lingula concerning for pneumonia 3. 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 Mild wall thickening of the esophagus which may be secondary to esophagitis. 4. Multiple wedge compression deformities throughout the thoracic and lumbar spine. Many of these are chronic. Recommend correlation for point tenderness. 5. Hepatic steatosis. 6. Aortic atherosclerosis. Aortic Atherosclerosis (ICD10-I70.0). Electronically Signed   By: Lovey Newcomer M.D.   On: 05/29/2019 17:58        Scheduled Meds: . vitamin C  500 mg Oral Daily  . aspirin EC  81 mg Oral Daily  . baclofen  10 mg Oral Daily  . buPROPion  150 mg Oral Daily  . carbidopa-levodopa  1 tablet Oral QHS  . carbidopa-levodopa  1 tablet Oral QID  . cholecalciferol  1,000 Units Oral Daily  . dexamethasone (DECADRON) injection  6 mg Intravenous Q24H  . enoxaparin (LOVENOX) injection  40 mg Subcutaneous Q24H  . famotidine  20 mg Oral BID  . feeding supplement (ENSURE ENLIVE)  237 mL Oral BID BM  . folic acid  1 mg Oral Daily  . gabapentin  200 mg Oral BID  . guaiFENesin  600 mg Oral BID  . multivitamin with minerals  1 tablet Oral Daily  . pantoprazole  40 mg Oral BID  . sertraline  50 mg Oral Daily  . sodium chloride flush  3 mL Intravenous Q12H  . thiamine  100 mg Oral Daily  . vitamin B-12  100 mcg Oral Daily  . zinc sulfate  220 mg Oral Daily   Continuous Infusions: . azithromycin 500 mg (05/30/19 0811)  . cefTRIAXone (ROCEPHIN)  IV 2 g (05/30/19 [id number])  . remdesivir 100 mg in NS 100 mL 100 mg (05/30/19 0909)    Assessment & Plan:   Active Problems:   Acute respiratory failure due to COVID-19 ([place])   1. Acute hypoxemic respiratory failure secondary to  COVID-19. -The patient will be admitted to a medically monitored isolation bed. -O2 protocol will be followed to keep O2 saturation above 93. Continue on IV steroids and IV antibiotic  2. Multifocal pneumonia secondary to COVID-19. -Continue droplet and contact precautions. Slowly improving. Continue  IV Rocephin and Zithromax for possible bacterial superinfection only with elevated Procalcitonin.  -Continue Mucinex and as needed Tussionex. -sputum Gram stain culture and sensitivity and follow blood cultures not seen yet. -O2 protocol will be followed. -f/u inflammatory markers. - Continue IV Remdesivir and IV steroid therapy with Decadron with elevated inflammatory markers. -Continue vitamin D3, vitamin C, zinc  sulfate, p.o. Pepcid and aspirin. CTA neg. For PE -Actemra can be considered for CRP more than 7 with associated hypoxemia. -wean down oxygen  3.  Parkinson's disease with current resting tremors. - continue his Sinemet.  4.  Hypertensive urgency. Improved, now on lower side, will hold lisinopril for now  5.  GERD. -continue PPI therapy.  6.  Depression. -We will continue Zoloft and Wellbutrin XL.  7.  Peripheral neuropathy. -continue Neurontin.  8.  Chronic atrial fibrillation. -Given history of recurrent falls, she is on no anticoagulation.  9.  Status post right radius ORIF. -Pain management per Ortho. Start oxycodone prn     DVT prophylaxis: Lovenox Code Status: Full Family Communication: None at bedside Disposition Plan: SNF Barrier: SNF pending, still requiring IV antibiotics and IV steroids's, will DC when medically more stable for safe discharge planning possibly in 1 to 2 days       LOS: 3 days   Time spent: 45 minutes with more than 50% COC    Nolberto Hanlon, MD [facility] Pager [phone]  If 7PM-7AM, please contact night-coverage [email or web address] Password [facility] 05/31/2019, 9:07 AM Patient ID: Charles Stanton, male   DOB:  [birth year], 64 y.o.   MRN: [id number]

## 2019-05-31 NOTE — Plan of Care (Signed)
Pt had a bm after milk of mag and  colace, pt ambulated in room with 1 assist with a walker. No complaints during this shift. Pt resting comfortably in recliner, will continue to assess.  Problem: Education: Goal: Knowledge of General Education information will improve Description: Including pain rating scale, medication(s)/side effects and non-pharmacologic comfort measures Outcome: Progressing   Problem: Activity: Goal: Risk for activity intolerance will decrease Outcome: Progressing   Problem: Nutrition: Goal: Adequate nutrition will be maintained Outcome: Progressing   Problem: Elimination: Goal: Will not experience complications related to bowel motility Outcome: Progressing

## 2019-05-31 NOTE — Progress Notes (Signed)
Patient resting comfortably in bed. Assisted to bathroom, tolerated mobility well, not able to have a bowel movement. Refused PRN milk of magnesium.

## 2019-05-31 NOTE — Progress Notes (Signed)
PHARMACIST - PHYSICIAN COMMUNICATION   CONCERNING: Antibiotic IV to Oral Route Change Policy  RECOMMENDATION: This patient is receiving azithromycin by the intravenous route.  Based on criteria approved by the Pharmacy and Therapeutics Committee, the antibiotic(s) is/are being converted to the equivalent oral dose form(s).   DESCRIPTION: These criteria include:  Patient being treated for a respiratory tract infection, urinary tract infection, cellulitis or clostridium difficile associated diarrhea if on metronidazole  The patient is not neutropenic and does not exhibit a GI malabsorption state  The patient is eating (either orally or via tube) and/or has been taking other orally administered medications for a least 24 hours  The patient is improving clinically and has a Tmax < 100.5  If you have questions about this conversion, please contact the Pharmacy Department    Lesly Joslyn, PharmD    

## 2019-05-31 NOTE — Progress Notes (Signed)
Physical Therapy Treatment Patient Details Name: Charles Stanton MRN: [id number] DOB: [birth year] Today's Date: 05/31/2019    History of Present Illness Per MD notes: Pt is a 64 y.o. male with a PMH that includes: Parkinson's disease, hypertension, dyslipidemia, atrial fibrillation, and peripheral vascular disease, who underwent ORIF of his right radius fracture after recent fall.  Pt postoperatively became hypoxemic with dyspnea and diminishing pulse oximetry from initially requiring 2LO2 to requiring 6LO2.  MD assessment includes: Acute hypoxemic respiratory failure and multifocal pneumonia secondary to COVID-19, right distal radius ORIF, and hypertensive urgency.    PT Comments    Pt on commode upon arrival. [facility] in with RN. Unable to have BM.  Stood with min a x 1 due to low commode height.  He is able to complete 2 laps in room with R platform walker and min guard/assist at times to assist with turns and walker management.  Overall balance improved today but continues to require +1 asisst at all times for safety and fall prevention.  Remained in recliner with RN for meds.  Discussed discharge plan.  Per SWS notes and conversation pt stated he wishes to go home vs SNF.  He stated "I've been walking around for years on my own."  When sitting in chair for breakfast he is hesitant stating "I can't get up by myself."  Education that he needs to call for assist and voiced understanding.  When questioned if he will have help at home he stated his wife will continue to work out of the home upon discharge.  Re-enforced need for +1 assist for mobility at home for overall safety.  She will be off through Tuesday for [employer] day which should allow for some increased assist for a couple days but concern remains for following days.    If pt chooses to return home, HHPT which he stated is already set up would be recommended, a platform RW (either a platform to attach to his current walker but if not able a new  set up as some walkers do not support attachment).  Pt has a bedside commode that should double as a shower chair if needed but given Parkinson's and UE fracture a tub transfer bench would be overall safer.  HHPT should be able to advise as needed further equipment needs in the home.  Will leave discharge recommendations at SNF given assist and fall risk and his continued need for cues not to use RUE for transfers and proper positioning and use in platform walker (continues to put wrist in arm trough and not elbow so WB is more on wrist than elbow as intended) but anticipate discharge home per pt choice.   Follow Up Recommendations  SNF;Supervision for mobility/OOB;Other (comment)     Equipment Recommendations  Other (comment);3in1 (PT)(R platform walker)    Recommendations for Other Services       Precautions / Restrictions Precautions Precautions: Fall Restrictions Weight Bearing Restrictions: Yes RUE Weight Bearing: Non weight bearing Other Position/Activity Restrictions: Pt may use platform walker on the RUE    Mobility  Bed Mobility               General bed mobility comments: on comode with RN upon arrival  Transfers Overall transfer level: Needs assistance Equipment used: Right platform walker   Sit to Stand: Min assist;From elevated surface            Ambulation/Gait Ambulation/Gait assistance: Min guard;Min assist Gait Distance (Feet): 40 Feet Assistive device: Right platform walker  Gait Pattern/deviations: Step-through pattern;Decreased step length - right;Decreased step length - left Gait velocity: decreased   General Gait Details: 2 laps in room with mingaurd/assist for balance and to turn walker   Stairs             Wheelchair Mobility    Modified Rankin (Stroke Patients Only)       Balance Overall balance assessment: Needs assistance Sitting-balance support: Feet supported;Single extremity supported Sitting balance-Leahy Scale:  Good     Standing balance support: Bilateral upper extremity supported Standing balance-Leahy Scale: Fair                              Cognition Arousal/Alertness: Awake/alert Behavior During Therapy: WFL for tasks assessed/performed Overall Cognitive Status: Within Functional Limits for tasks assessed                                        Exercises      General Comments        Pertinent Vitals/Pain Pain Assessment: No/denies pain    Home Living                      Prior Function            PT Goals (current goals can now be found in the care plan section) Progress towards PT goals: Progressing toward goals    Frequency    7X/week      PT Plan Current plan remains appropriate    Co-evaluation              AM-PAC PT "6 Clicks" Mobility   Outcome Measure  Help needed turning from your back to your side while in a flat bed without using bedrails?: A Little Help needed moving from lying on your back to sitting on the side of a flat bed without using bedrails?: A Little Help needed moving to and from a bed to a chair (including a wheelchair)?: A Little Help needed standing up from a chair using your arms (e.g., wheelchair or bedside chair)?: A Little Help needed to walk in hospital room?: A Little Help needed climbing 3-5 steps with a railing? : A Lot 6 Click Score: 17    End of Session Equipment Utilized During Treatment: Gait belt Activity Tolerance: Patient tolerated treatment well Patient left: in chair;with call bell/phone within reach;with chair alarm set;with nursing/sitter in room Nurse Communication: Mobility status Pain - Right/Left: Right     Time: [id number] PT Time Calculation (min) (ACUTE ONLY): 25 min  Charges:  $Gait Training: 8-22 mins $Therapeutic Activity: 8-22 mins                     ,Chesley Noon, PTA 05/31/19, 10:08 AM

## 2019-06-01 DIAGNOSIS — I10 Essential (primary) hypertension: Secondary | ICD-10-CM

## 2019-06-01 LAB — CBC WITH DIFFERENTIAL/PLATELET
Abs Immature Granulocytes: 0.06 10*3/uL (ref 0.00–0.07)
Basophils Absolute: 0 10*3/uL (ref 0.0–0.1)
Basophils Relative: 0 %
Eosinophils Absolute: 0 10*3/uL (ref 0.0–0.5)
Eosinophils Relative: 0 %
HCT: 32.2 % — ABNORMAL LOW (ref 39.0–52.0)
Hemoglobin: 11 g/dL — ABNORMAL LOW (ref 13.0–17.0)
Immature Granulocytes: 1 %
Lymphocytes Relative: 6 %
Lymphs Abs: 0.3 10*3/uL — ABNORMAL LOW (ref 0.7–4.0)
MCH: 31.4 pg (ref 26.0–34.0)
MCHC: 34.2 g/dL (ref 30.0–36.0)
MCV: 92 fL (ref 80.0–100.0)
Monocytes Absolute: 0.4 10*3/uL (ref 0.1–1.0)
Monocytes Relative: 7 %
Neutro Abs: 4.6 10*3/uL (ref 1.7–7.7)
Neutrophils Relative %: 86 %
Platelets: 145 10*3/uL — ABNORMAL LOW (ref 150–400)
RBC: 3.5 MIL/uL — ABNORMAL LOW (ref 4.22–5.81)
RDW: 14.2 % (ref 11.5–15.5)
WBC: 5.4 10*3/uL (ref 4.0–10.5)
nRBC: 0 % (ref 0.0–0.2)

## 2019-06-01 LAB — MAGNESIUM: Magnesium: 1.9 mg/dL (ref 1.7–2.4)

## 2019-06-01 LAB — COMPREHENSIVE METABOLIC PANEL
ALT: <5 U/L (ref 0–44)
AST: 18 U/L (ref 15–41)
Albumin: 2.5 g/dL — ABNORMAL LOW (ref 3.5–5.0)
Alkaline Phosphatase: 85 U/L (ref 38–126)
Anion gap: 5 (ref 5–15)
BUN: 14 mg/dL (ref 8–23)
CO2: 28 mmol/L (ref 22–32)
Calcium: 8 mg/dL — ABNORMAL LOW (ref 8.9–10.3)
Chloride: 102 mmol/L (ref 98–111)
Creatinine, Ser: 0.5 mg/dL — ABNORMAL LOW (ref 0.61–1.24)
GFR calc Af Amer: >60 mL/min (ref >60)
GFR calc non Af Amer: >60 mL/min (ref >60)
Glucose, Bld: 142 mg/dL — ABNORMAL HIGH (ref 70–99)
Potassium: 3.8 mmol/L (ref 3.5–5.1)
Sodium: 135 mmol/L (ref 135–145)
Total Bilirubin: 0.8 mg/dL (ref 0.3–1.2)
Total Protein: 5.1 g/dL — ABNORMAL LOW (ref 6.5–8.1)

## 2019-06-01 LAB — FIBRIN DERIVATIVES D-DIMER (ARMC ONLY): Fibrin derivatives D-dimer (ARMC): 1125.31 ng/mL (FEU) — ABNORMAL HIGH (ref 0.00–499.00)

## 2019-06-01 LAB — FERRITIN: Ferritin: 99 ng/mL (ref 24–336)

## 2019-06-01 LAB — C-REACTIVE PROTEIN: CRP: 4.3 mg/dL — ABNORMAL HIGH (ref <1.0)

## 2019-06-01 MED ORDER — PREDNISONE 20 MG PO TABS
20.0000 mg | ORAL_TABLET | Freq: Every day | ORAL | 0 refills | Status: AC
Start: 2019-06-04 — End: 2019-06-07

## 2019-06-01 MED ORDER — LISINOPRIL 5 MG PO TABS: 5.0000 mg | ORAL_TABLET | Freq: Every day | ORAL | 0 refills | Status: DC

## 2019-06-01 MED ORDER — AMOXICILLIN-POT CLAVULANATE 875-125 MG PO TABS
1.0000 | ORAL_TABLET | Freq: Two times a day (BID) | ORAL | Status: DC
  Administered 2019-06-01: 1 via ORAL
  Filled 2019-06-01: qty 1

## 2019-06-01 MED ORDER — AMOXICILLIN-POT CLAVULANATE 875-125 MG PO TABS: 1.0000 | ORAL_TABLET | Freq: Two times a day (BID) | ORAL | 0 refills | Status: AC

## 2019-06-01 MED ORDER — ZINC SULFATE 220 (50 ZN) MG PO CAPS: 220.0000 mg | ORAL_CAPSULE | Freq: Every day | ORAL | 0 refills | Status: DC

## 2019-06-01 MED ORDER — GUAIFENESIN ER 600 MG PO TB12: 600.0000 mg | ORAL_TABLET | Freq: Two times a day (BID) | ORAL | 0 refills | Status: AC

## 2019-06-01 MED ORDER — ASCORBIC ACID 500 MG PO TABS: 500.0000 mg | ORAL_TABLET | Freq: Every day | ORAL | 0 refills | Status: DC

## 2019-06-01 MED ORDER — THIAMINE HCL 100 MG PO TABS: 100.0000 mg | ORAL_TABLET | Freq: Every day | ORAL | 0 refills | Status: DC

## 2019-06-01 MED ORDER — VITAMIN D3 25 MCG PO TABS: 1000.0000 [IU] | ORAL_TABLET | Freq: Every day | ORAL | 0 refills | Status: AC

## 2019-06-01 MED ORDER — PREDNISONE 20 MG PO TABS
40.0000 mg | ORAL_TABLET | Freq: Every day | ORAL | 0 refills | Status: AC
Start: 2019-06-01 — End: 2019-06-04

## 2019-06-01 NOTE — TOC Transition Note (Signed)
Transition of Care [facility]) - CM/SW Discharge Note   Patient Details  Name: Charles Stanton MRN: [id number] Date of Birth: [birth year]  [facility] of [facility]) CM/SW Contact:  Eileen Stanford, LCSW Phone Number: 06/01/2019, 11:35 AM   Clinical Narrative:  Pt dc today. HH arranged through [facility]. Pt has necessary equipment at home. No additional needs at this time.     Final next level of care: Home w Home Health Services Barriers to Discharge: No Barriers Identified   Patient Goals and CMS Choice Patient states their goals for this hospitalization and ongoing recovery are:: to get better   Choice offered to / list presented to : Patient  Discharge Placement                    Patient and family notified of of transfer: 06/01/19  Discharge Plan and Services In-house Referral: NA   Post Acute Care Choice: Home Health                    HH Arranged: PT, OT [facility] Agency: [place] Date [place]: 06/01/19 Time [place]: [id number] Representative spoke with at [place]: [place] Determinants of Health ([place]) Interventions     Readmission Risk Interventions No flowsheet data found.

## 2019-06-01 NOTE — Discharge Summary (Signed)
Charles Stanton [id number] DOB: 1956/01/01 DOA: 05/28/2019  PCP: [name], MD  Admit date: 05/28/2019 Discharge date: 06/01/2019  Admitted From: Home Disposition: Home  Recommendations for Outpatient Follow-up:  1. Follow up with PCP in 1 week 2. Please obtain BMP/CBC in one week  Home Health:PT/OT    Discharge Condition:Stable CODE STATUS: Full Diet recommendation: Heart Healthy  Brief/Interim Summary: Charles Stanton  is a 64 y.o. Caucasian male with a known history of Parkinson's disease, hypertension, dyslipidemia, atrial fibrillation on Eliquis and peripheral vascular disease, who underwent ORIF of her right radius fracture this afternoon and postoperatively became hypoxemic with dyspnea and diminishing pulse oximetry from initially 90% on 2 L O2 to 89% requiring 6 L O2 to maintain a pulse oximetry of 94% given the fact that he is a mouth breather he was placed on nonrebreather mask.  The patient fell yesterday when he lost his balance and had subsequent right radius fracture.  He denies any fever at home but had low-grade fever of 99.6 here without chills.  He was having active tremors during my interview.  He admitted to having cough that is mainly dry and occasionally productive of whitish sputum over the last week without wheezing or dyspnea. Cxr showed bibasal opacities right worse than left that may include atelectasis or infection.  1. Acute hypoxemic respiratory failure secondary to COVID-19.   Patient was started on IV steroids and IV antibiotics. He is off oxygen and ambulated off oxygen satting 98% this a.m.  2. Multifocal pneumonia secondary to COVID-19.   Improved now off oxygen   Treated with IV Rocephin and Zithromax for possible bacterial superinfection only with elevated Procalcitonin.  Will DC on steroid taper and Augmentin. -Continue Mucinex  Continue vitamin D3, vitamin C, zinc sulfate CTA neg. For PE   3. Parkinson's disease with current resting  tremors. - continue his Sinemet.  4. Hypertensive urgency. Improved, now on lower side, decrease lisinopril to 5mg  on discharge F/u with pcp for further evalatuion  5. GERD. -continue PPI therapy.  6. Depression. continue Zoloft and Wellbutrin XL.  7. Peripheral neuropathy. -continue Neurontin.  8. Chronic atrial fibrillation. -Given history of recurrent falls, pt is on no anticoagulation.  9. Status post right radius ORIF. F/u ortho as outpt oxycodone prn  Discharge Diagnoses:  Active Problems:   Acute respiratory failure due to COVID-19 [facility])    Discharge Instructions   Allergies as of 06/01/2019      Reactions   Amantadine Other (See Comments)   fatigue sleepy Other reaction(s): Other (See Comments) fatigue sleepy fatigue sleepy fatigue sleepy      Medication List    TAKE these medications   amoxicillin-clavulanate 875-125 MG tablet Commonly known as: AUGMENTIN Take 1 tablet by mouth every 12 (twelve) hours for 5 days.   ascorbic acid 500 MG tablet Commonly known as: VITAMIN C Take 1 tablet (500 mg total) by mouth daily. Start taking on: Jun 02, 2019   baclofen 10 MG tablet Commonly known as: LIORESAL Take 10 mg by mouth daily.   buPROPion 150 MG 24 hr tablet Commonly known as: WELLBUTRIN XL Take 150 mg by mouth daily.   carbidopa-levodopa 50-200 MG tablet Commonly known as: SINEMET CR Take 1 tablet by mouth at bedtime.   carbidopa-levodopa 25-100 MG tablet Commonly known as: SINEMET IR TK 1 T PO four times a day   folic acid 1 MG tablet Commonly known as: FOLVITE Take 1 tablet (1 mg total) by mouth daily.  gabapentin 100 MG capsule Commonly known as: NEURONTIN Take 2 capsules (200 mg total) by mouth 2 (two) times daily.   guaiFENesin 600 MG 12 hr tablet Commonly known as: MUCINEX Take 1 tablet (600 mg total) by mouth 2 (two) times daily for 7 days.   lisinopril 5 MG tablet Commonly known as: ZESTRIL Take 1 tablet (5  mg total) by mouth at bedtime. What changed:   medication strength  how much to take   multivitamin with minerals Tabs tablet Take 1 tablet by mouth daily.   oxyCODONE 5 MG immediate release tablet Commonly known as: Roxicodone Take 1 tablet (5 mg total) by mouth every 8 (eight) hours as needed.   oxyCODONE-acetaminophen 5-325 MG tablet Commonly known as: Percocet Take 1 tablet by mouth every 4 (four) hours as needed for severe pain.   pantoprazole 40 MG tablet Commonly known as: PROTONIX Take 1 tablet (40 mg total) by mouth 2 (two) times daily.   predniSONE 20 MG tablet Commonly known as: DELTASONE Take 2 tablets (40 mg total) by mouth daily for 3 days.   predniSONE 20 MG tablet Commonly known as: DELTASONE Take 1 tablet (20 mg total) by mouth daily for 3 days. Start taking on: June 04, 2019   sertraline 50 MG tablet Commonly known as: ZOLOFT Take 1 tablet (50 mg total) by mouth daily.   thiamine 100 MG tablet Take 1 tablet (100 mg total) by mouth daily. What changed: Another medication with the same name was added. Make sure you understand how and when to take each.   thiamine 100 MG tablet Take 1 tablet (100 mg total) by mouth daily. What changed: You were already taking a medication with the same name, and this prescription was added. Make sure you understand how and when to take each.   traZODone 100 MG tablet Commonly known as: DESYREL Take 100 mg by mouth at bedtime.   vitamin B-12 100 MCG tablet Commonly known as: CYANOCOBALAMIN Take 100 mcg by mouth daily.   Vitamin D3 25 MCG tablet Commonly known as: Vitamin D Take 1 tablet (1,000 Units total) by mouth daily. Start taking on: Jun 02, 2019   zinc sulfate 220 (50 Zn) MG capsule Take 1 capsule (220 mg total) by mouth daily.      Follow-up Information    Hessie Knows, MD In 3 days.   Specialty: Orthopedic Surgery Why: For wound re-check, appointment already made Contact information: [address] [facility] [place] [address] [place] Follow up.   Why: Physical Therapy and Occupational Therapy Contact information: [address] [place] [address] [phone]        [name], MD Follow up in 1 week(s).   Specialty: Internal Medicine Contact information: [place] [address] [phone]          Allergies  Allergen Reactions  . Amantadine Other (See Comments)    fatigue sleepy Other reaction(s): Other (See Comments) fatigue sleepy fatigue sleepy fatigue sleepy    Consultations: ortho  Procedures/Studies: DG Wrist 2 Views Right  Result Date: 05/28/2019 CLINICAL DATA:  Status post ORIF EXAM: RIGHT WRIST - 2 VIEW COMPARISON:  05/27/2019 FINDINGS: Interval surgical plate and multiple screw fixation of comminuted intra-articular distal radius fracture with decreased displacement and angulation. Acute displaced ulnar styloid process fracture. IMPRESSION: 1. Interval internal fixation of distal radius fracture 2. Acute displaced ulnar styloid process fracture  Electronically Signed   By: Donavan Foil M.D.   On: 05/28/2019 19:18   DG Wrist 2 Views Right  Result Date: 05/27/2019 CLINICAL DATA:  Post reduction right wrist EXAM: RIGHT WRIST - 2 VIEW COMPARISON:  Earlier same day FINDINGS: Fine bony detail is obscured. Redemonstration fracture of the distal right radius with persistent foreshortening, displacement, and dorsal angulation. IMPRESSION: Distal right radius fracture remains displaced and angulated. Electronically Signed   By: Macy Mis M.D.   On: 05/27/2019 15:05   DG Wrist Complete Right  Result Date: 05/27/2019 CLINICAL DATA:  Fall with right wrist pain and deformity EXAM: RIGHT WRIST - COMPLETE 3+ VIEW COMPARISON:  None. FINDINGS: Comminuted intra-articular distal right radius fracture with impaction and 4 mm dorsal displacement of the dominant  distal fracture fragments and mild apex volar angulation. Probable nondisplaced ulnar styloid fracture. No dislocation. Prominent diffuse right wrist soft tissue swelling. No radiopaque foreign bodies. IMPRESSION: 1. Comminuted impacted displaced angulated intra-articular distal right radius fracture as detailed. 2. Probable nondisplaced ulnar styloid fracture. Electronically Signed   By: Ilona Sorrel M.D.   On: 05/27/2019 12:59   CT ANGIO CHEST PE W OR WO CONTRAST  Result Date: 05/29/2019 CLINICAL DATA:  Respiratory failure. EXAM: CT ANGIOGRAPHY CHEST WITH CONTRAST TECHNIQUE: Multidetector CT imaging of the chest was performed using the standard protocol during bolus administration of intravenous contrast. Multiplanar CT image reconstructions and MIPs were obtained to evaluate the vascular anatomy. CONTRAST:  48mL OMNIPAQUE IOHEXOL 350 MG/ML SOLN COMPARISON:  Chest radiograph 05/28/2019 FINDINGS: Cardiovascular: Heart is enlarged. Trace fluid superior pericardial recess. Coronary arterial vascular calcifications. Thoracic aortic vascular calcifications. Adequate opacification of the pulmonary arterial system. No evidence for acute pulmonary embolus. Mediastinum/Nodes: No enlarged axillary, mediastinal or hilar lymphadenopathy. 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 Mild wall thickening of the esophagus. Lungs/Pleura: Central airways are patent. Patchy ground-glass and consolidative opacities demonstrated within the lower lobes bilaterally as well as within the right middle lobe and lingula. Patchy ground-glass opacities within the right upper lobe. None no definite pleural effusion or pneumothorax. Upper Abdomen: The liver is diffusely low in attenuation compatible with steatosis. No acute process. Musculoskeletal: Thoracic spine degenerative changes. MultipleBilateral rib fractures. T9 kyphoplasty material. Multiple wedge compression deformities are demonstrated throughout the thoracic spine involving the  T2, T3, T6, T7, T8, T11 and T12 vertebral bodies. Additionally, there is a compression deformity of the L1 and L2 vertebral bodies. Review of the MIP images confirms the above findings. IMPRESSION: 1. No evidence for acute pulmonary embolus. 2. Patchy ground-glass and consolidative opacities within the lower lobes bilaterally as well as within the right middle lobe and lingula concerning for pneumonia 3. 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 Mild wall thickening of the esophagus which may be secondary to esophagitis. 4. Multiple wedge compression deformities throughout the thoracic and lumbar spine. Many of these are chronic. Recommend correlation for point tenderness. 5. Hepatic steatosis. 6. Aortic atherosclerosis. Aortic Atherosclerosis (ICD10-I70.0). Electronically Signed   By: Lovey Newcomer M.D.   On: 05/29/2019 17:58   DG Chest Port 1 View  Result Date: 05/28/2019 CLINICAL DATA:  Hypoxia EXAM: PORTABLE CHEST 1 VIEW COMPARISON:  01/23/2018 FINDINGS: Bibasilar opacities, right worse than left. No pleural effusion or pneumothorax. Intermediate sized hiatal hernia. IMPRESSION: Bibasilar opacities, right worse than left, which may indicate atelectasis or infection. Electronically Signed   By: Ulyses Jarred M.D.   On: 05/28/2019 19:48   [place] OR NERVE BLOCK-IMAGE ONLY [facility])  Result Date:  05/28/2019 There is no interpretation for this exam.  This order is for images obtained during a surgical procedure.  Please See "Surgeries" Tab for more information regarding the procedure.   DG MINI C-ARM IMAGE ONLY  Result Date: 05/28/2019 There is no interpretation for this exam.  This order is for images obtained during a surgical procedure.  Please See "Surgeries" Tab for more information regarding the procedure.      Subjective: Has no complaints. Does not want to go to rehab, will get home health. Feels better and ready to go home  Discharge Exam: Vitals:   06/01/19 1026 06/01/19 1212  BP:  (!) 144/77   Pulse:  (!) 55  Resp:    Temp:  99.9 F (37.7 C)  SpO2: 98% 99%   Vitals:   06/01/19 0602 06/01/19 0750 06/01/19 1026 06/01/19 1212  BP: (!) 143/82 (!) 151/97  (!) 144/77  Pulse: (!) 58 (!) 54  (!) 55  Resp: 17 18    Temp: 99.5 F (37.5 C) 98.2 F (36.8 C)  99.9 F (37.7 C)  TempSrc:  Oral  Oral  SpO2: 97% 97% 98% 99%  Weight: 57.9 kg     Height:        General: Pt is alert, awake, not in acute distress Cardiovascular: RRR, S1/S2 +, no rubs, no gallops Respiratory: CTA bilaterally, no wheezing, no rhonchi Abdominal: Soft, NT, ND, bowel sounds + Extremities: no edema, no cyanosis, RUE wrapped in dressing and ace band    The results of significant diagnostics from this hospitalization (including imaging, microbiology, ancillary and laboratory) are listed below for reference.     Microbiology: Recent Results (from the past 240 hour(s))  SARS Coronavirus 2 by RT PCR (hospital order, performed in [place] hospital lab) Nasopharyngeal Nasopharyngeal Swab     Status: Abnormal   Collection Time: 05/27/19  3:36 PM   Specimen: Nasopharyngeal Swab  Result Value Ref Range Status   SARS Coronavirus 2 POSITIVE (A) NEGATIVE Final    Comment: RESULT CALLED TO, READ BACK BY AND VERIFIED WITH: HEATHER FISHER RN AT [id number] ON 05/27/19 SNG (NOTE) SARS-CoV-2 target nucleic acids are DETECTED SARS-CoV-2 RNA is generally detectable in upper respiratory specimens  during the acute phase of infection.  Positive results are indicative  of the presence of the identified virus, but do not rule out bacterial infection or co-infection with other pathogens not detected by the test.  Clinical correlation with patient history and  other diagnostic information is necessary to determine patient infection status.  The expected result is negative. Fact Sheet for Patients:   StrictlyIdeas.no  Fact Sheet for Healthcare Providers:   BankingDealers.co.za    This test is not yet approved or cleared by the [place] FDA and  has been authorized for detection and/or diagnosis of SARS-CoV-2 by FDA under an Emergency Use Authorization (EUA).  This EUA will remain in effect (meaning this test  can be used) for the duration of  the COVID-19 declaration under Section 564(b)(1) of the Act, 21 U.S.C. section 360-bbb-3(b)(1), unless the authorization is terminated or revoked sooner. Performed at [facility], [address] [place], [place] [address]      Labs: BNP (last 3 results) No results for input(s): BNP in the last 8760 hours. Basic Metabolic Panel: Recent Labs  Lab 05/28/19 2036 05/29/19 0634 05/30/19 0623 05/31/19 0617 06/01/19 0524  NA 133* 130* 135 138 135  K 3.8 3.9 4.0 3.7 3.8  CL 96* 96* 100 105  102  CO2 24 23 26 27 28   GLUCOSE 102* 100* 129* 139* 142*  BUN 9 9 16 17 14   CREATININE 0.57* 0.54* 0.45* 0.41* 0.50*  CALCIUM 8.6* 8.2* 8.3* 7.9* 8.0*  MG  --  1.4* 1.9 1.8 1.9   Liver Function Tests: Recent Labs  Lab 05/28/19 2036 05/29/19 0634 05/30/19 0623 05/31/19 0617 06/01/19 0524  AST 40 34 31 22 18   ALT 20 5 8  <5 <5  ALKPHOS 125 85 84 81 85  BILITOT 1.9* 1.5* 1.2 0.9 0.8  PROT 6.2* 5.5* 5.6* 5.0* 5.1*  ALBUMIN 3.4* 2.9* 2.8* 2.4* 2.5*   No results for input(s): LIPASE, AMYLASE in the last 168 hours. No results for input(s): AMMONIA in the last 168 hours. CBC: Recent Labs  Lab 05/28/19 2036 05/29/19 0634 05/30/19 0623 05/31/19 0617 06/01/19 0524  WBC 4.1 8.8 8.6 7.5 5.4  NEUTROABS 3.7 8.0* 7.7 6.7 4.6  HGB 13.4 12.0* 11.8* 10.5* 11.0*  HCT 39.4 35.4* 36.2* 32.3* 32.2*  MCV 93.1 91.9 96.0 96.1 92.0  PLT 155 133* 135* 139* 145*   Cardiac Enzymes: No results for input(s): CKTOTAL, CKMB, CKMBINDEX, TROPONINI in the last 168 hours. BNP: Invalid input(s): POCBNP CBG: No results for input(s): GLUCAP in the last 168 hours. D-Dimer No results for input(s): DDIMER in the last 72  hours. Hgb A1c No results for input(s): HGBA1C in the last 72 hours. Lipid Profile No results for input(s): CHOL, HDL, LDLCALC, TRIG, CHOLHDL, LDLDIRECT in the last 72 hours. Thyroid function studies No results for input(s): TSH, T4TOTAL, T3FREE, THYROIDAB in the last 72 hours.  Invalid input(s): FREET3 Anemia work up Recent Labs    05/31/19 0617 06/01/19 0524  FERRITIN 139 99   Urinalysis    Component Value Date/Time   COLORURINE AMBER (A) 01/24/2018 1117   APPEARANCEUR CLEAR (A) 01/24/2018 1117   LABSPEC 1.017 01/24/2018 1117   PHURINE 5.0 01/24/2018 1117   [place] 01/24/2018 1117   [place] 01/24/2018 1117   [place] 01/24/2018 1117   KETONESUR 20 (A) 01/24/2018 1117   PROTEINUR NEGATIVE 01/24/2018 1117   NITRITE NEGATIVE 01/24/2018 1117   LEUKOCYTESUR NEGATIVE 01/24/2018 1117   Sepsis Labs Invalid input(s): PROCALCITONIN,  WBC,  LACTICIDVEN Microbiology Recent Results (from the past 240 hour(s))  SARS Coronavirus 2 by RT PCR (hospital order, performed in [place] hospital lab) Nasopharyngeal Nasopharyngeal Swab     Status: Abnormal   Collection Time: 05/27/19  3:36 PM   Specimen: Nasopharyngeal Swab  Result Value Ref Range Status   SARS Coronavirus 2 POSITIVE (A) NEGATIVE Final    Comment: RESULT CALLED TO, READ BACK BY AND VERIFIED WITH: HEATHER FISHER RN AT [id number] ON 05/27/19 SNG (NOTE) SARS-CoV-2 target nucleic acids are DETECTED SARS-CoV-2 RNA is generally detectable in upper respiratory specimens  during the acute phase of infection.  Positive results are indicative  of the presence of the identified virus, but do not rule out bacterial infection or co-infection with other pathogens not detected by the test.  Clinical correlation with patient history and  other diagnostic information is necessary to determine patient infection status.  The expected result is negative. Fact Sheet for Patients:    StrictlyIdeas.no  Fact Sheet for Healthcare Providers:   BankingDealers.co.za   This test is not yet approved or cleared by the [place] FDA and  has been authorized for detection and/or diagnosis of SARS-CoV-2 by FDA under an Emergency Use Authorization (EUA).  This EUA will remain in effect (  meaning this test  can be used) for the duration of  the COVID-19 declaration under Section 564(b)(1) of the Act, 21 U.S.C. section 360-bbb-3(b)(1), unless the authorization is terminated or revoked sooner. Performed at [facility], [address] [place], [place] [address]      Time coordinating discharge: Over 30 minutes  SIGNED:   Nolberto Hanlon, MD  [facility] 06/01/2019, 12:16 PM Pager   If 7PM-7AM, please contact night-coverage [email or web address] Password [email or web address]

## 2019-06-01 NOTE — Progress Notes (Signed)
PT Cancellation Note  Patient Details Name: Charles Stanton MRN: [id number] DOB: [birth year]   Cancelled Treatment:     PT attempt. Pt refused. "I'm going home and I'll do my therapy there." Therapist encouraged participation prior to DC however pt continued to be unwilling.    Willette Pa 06/01/2019, 12:43 PM

## 2019-06-01 NOTE — Progress Notes (Addendum)
Patient is being discharge home as per order,, discharge instruction provided, iv removed tele removed patient dischaged home with home health, patient to follow up with orthopedic and PCP as per order

## 2019-06-01 NOTE — Plan of Care (Signed)
  Problem: Clinical Measurements: Goal: Respiratory complications will improve Outcome: Progressing   Problem: Respiratory: Goal: Will maintain a patent airway Outcome: Progressing

## 2019-07-24 ENCOUNTER — Encounter: Payer: Self-pay | Admitting: Orthopedic Surgery

## 2019-09-24 ENCOUNTER — Other Ambulatory Visit: Payer: Self-pay | Admitting: Family Medicine

## 2019-09-24 DIAGNOSIS — R1310 Dysphagia, unspecified: Secondary | ICD-10-CM

## 2019-10-02 ENCOUNTER — Ambulatory Visit: Payer: Medicare Other

## 2019-10-08 ENCOUNTER — Ambulatory Visit
Admission: RE | Admit: 2019-10-08 | Discharge: 2019-10-08 | Disposition: A | Discharge status: Home / Self Care | Payer: Medicare Other | Source: Ambulatory Visit | Attending: Family Medicine | Admitting: Family Medicine

## 2019-10-08 ENCOUNTER — Other Ambulatory Visit: Payer: Self-pay

## 2019-10-08 DIAGNOSIS — R1310 Dysphagia, unspecified: Secondary | ICD-10-CM | POA: Insufficient documentation

## 2019-10-11 ENCOUNTER — Other Ambulatory Visit: Payer: Self-pay

## 2019-10-11 ENCOUNTER — Other Ambulatory Visit
Admission: RE | Admit: 2019-10-11 | Discharge: 2019-10-11 | Disposition: A | Discharge status: Home / Self Care | Payer: Medicare Other | Source: Ambulatory Visit | Attending: Gastroenterology | Admitting: Gastroenterology

## 2019-10-11 DIAGNOSIS — Z20822 Contact with and (suspected) exposure to covid-19: Secondary | ICD-10-CM | POA: Diagnosis not present

## 2019-10-11 DIAGNOSIS — Z01818 Encounter for other preprocedural examination: Secondary | ICD-10-CM | POA: Insufficient documentation

## 2019-10-11 LAB — SARS CORONAVIRUS 2 (TAT 6-24 HRS): SARS Coronavirus 2: NEGATIVE

## 2019-10-14 ENCOUNTER — Encounter: Payer: Self-pay | Admitting: *Deleted

## 2019-10-15 ENCOUNTER — Ambulatory Visit
Admission: RE | Admit: 2019-10-15 | Discharge: 2019-10-15 | Disposition: A | Discharge status: Home / Self Care | Payer: Medicare Other | Attending: Gastroenterology | Admitting: Gastroenterology

## 2019-10-15 ENCOUNTER — Encounter
Admission: RE | Disposition: A | Discharge status: Home / Self Care | Payer: Self-pay | Source: Home / Self Care | Attending: Gastroenterology

## 2019-10-15 ENCOUNTER — Ambulatory Visit: Payer: Medicare Other | Admitting: Certified Registered Nurse Anesthetist

## 2019-10-15 ENCOUNTER — Other Ambulatory Visit: Payer: Self-pay

## 2019-10-15 ENCOUNTER — Encounter: Payer: Self-pay | Admitting: *Deleted

## 2019-10-15 DIAGNOSIS — I10 Essential (primary) hypertension: Secondary | ICD-10-CM | POA: Diagnosis not present

## 2019-10-15 DIAGNOSIS — K3189 Other diseases of stomach and duodenum: Secondary | ICD-10-CM | POA: Insufficient documentation

## 2019-10-15 DIAGNOSIS — E785 Hyperlipidemia, unspecified: Secondary | ICD-10-CM | POA: Insufficient documentation

## 2019-10-15 DIAGNOSIS — K449 Diaphragmatic hernia without obstruction or gangrene: Secondary | ICD-10-CM | POA: Diagnosis not present

## 2019-10-15 DIAGNOSIS — Z86010 Personal history of colonic polyps: Secondary | ICD-10-CM | POA: Diagnosis not present

## 2019-10-15 DIAGNOSIS — Z888 Allergy status to other drugs, medicaments and biological substances status: Secondary | ICD-10-CM | POA: Diagnosis not present

## 2019-10-15 DIAGNOSIS — F419 Anxiety disorder, unspecified: Secondary | ICD-10-CM | POA: Insufficient documentation

## 2019-10-15 DIAGNOSIS — R933 Abnormal findings on diagnostic imaging of other parts of digestive tract: Secondary | ICD-10-CM | POA: Diagnosis present

## 2019-10-15 DIAGNOSIS — K222 Esophageal obstruction: Secondary | ICD-10-CM | POA: Diagnosis not present

## 2019-10-15 DIAGNOSIS — Z85828 Personal history of other malignant neoplasm of skin: Secondary | ICD-10-CM | POA: Insufficient documentation

## 2019-10-15 DIAGNOSIS — F32A Depression, unspecified: Secondary | ICD-10-CM | POA: Insufficient documentation

## 2019-10-15 DIAGNOSIS — G20 Parkinson's disease: Secondary | ICD-10-CM | POA: Insufficient documentation

## 2019-10-15 DIAGNOSIS — I4891 Unspecified atrial fibrillation: Secondary | ICD-10-CM | POA: Diagnosis not present

## 2019-10-15 DIAGNOSIS — E7800 Pure hypercholesterolemia, unspecified: Secondary | ICD-10-CM | POA: Diagnosis not present

## 2019-10-15 DIAGNOSIS — I739 Peripheral vascular disease, unspecified: Secondary | ICD-10-CM | POA: Diagnosis not present

## 2019-10-15 DIAGNOSIS — Z79899 Other long term (current) drug therapy: Secondary | ICD-10-CM | POA: Insufficient documentation

## 2019-10-15 HISTORY — PX: ESOPHAGOGASTRODUODENOSCOPY (EGD) WITH PROPOFOL: SHX5813

## 2019-10-15 SURGERY — ESOPHAGOGASTRODUODENOSCOPY (EGD) WITH PROPOFOL
Anesthesia: General

## 2019-10-15 MED ORDER — LIDOCAINE HCL (CARDIAC) PF 100 MG/5ML IV SOSY
PREFILLED_SYRINGE | INTRAVENOUS | Status: DC | PRN
  Administered 2019-10-15: 50 mg via INTRAVENOUS

## 2019-10-15 MED ORDER — GLYCOPYRROLATE 0.2 MG/ML IJ SOLN
INTRAMUSCULAR | Status: DC | PRN
  Administered 2019-10-15: .1 mg via INTRAVENOUS

## 2019-10-15 MED ORDER — PROPOFOL 10 MG/ML IV BOLUS
INTRAVENOUS | Status: DC | PRN
  Administered 2019-10-15: 10 mg via INTRAVENOUS
  Administered 2019-10-15: 40 mg via INTRAVENOUS
  Administered 2019-10-15 (×3): 20 mg via INTRAVENOUS
  Administered 2019-10-15: 40 mg via INTRAVENOUS
  Administered 2019-10-15: 20 mg via INTRAVENOUS

## 2019-10-15 MED ORDER — PROPOFOL 500 MG/50ML IV EMUL
INTRAVENOUS | Status: DC | PRN
  Administered 2019-10-15: 150 ug/kg/min via INTRAVENOUS

## 2019-10-15 MED ORDER — SODIUM CHLORIDE 0.9 % IV SOLN: INTRAVENOUS | Status: DC

## 2019-10-15 NOTE — H&P (Signed)
Outpatient short stay form Pre-procedure 10/15/2019 12:30 PM Charles Miyamoto MD, MPH  Primary Physician: Dr. Ginette Pitman  Reason for visit:  Dysphagia and Abnormal Imaging  History of present illness:   64 y/o gentleman with history of parkinsons who is here for EGD for barium swallow showing high-grade obstruction in upper thoracic esophagus. Has been having dysphagia since the summer. States mainly to solids but liquids are fine. Had EGD 12/2017 with esophagitis. No blood thinners. Mother with history of cirrhosis.    Current Facility-Administered Medications:  .  0.9 %  sodium chloride infusion, , Intravenous, Continuous, Jacklyne Baik, Hilton Cork, MD  Medications Prior to Admission  Medication Sig Dispense Refill Last Dose  . amantadine (SYMMETREL) 100 MG capsule Take 50 mg by mouth daily.   10/15/2019 at 1000  . buPROPion (WELLBUTRIN XL) 150 MG 24 hr tablet Take 150 mg by mouth daily.   10/15/2019 at 1000  . carbidopa-levodopa (SINEMET IR) 25-100 MG tablet TK 1 T PO four times a day  1 10/15/2019 at 1000  . Carbidopa-Levodopa ER (RYTARY) 23.75-95 MG CPCR Take 1 capsule by mouth daily.   10/14/2019 at Unknown time  . ascorbic acid (VITAMIN C) 500 MG tablet Take 1 tablet (500 mg total) by mouth daily. 30 tablet 0   . baclofen (LIORESAL) 10 MG tablet Take 10 mg by mouth daily.   0   . carbidopa-levodopa (SINEMET CR) 50-200 MG tablet Take 1 tablet by mouth at bedtime.     . cholecalciferol (VITAMIN D) 25 MCG tablet Take 1 tablet (1,000 Units total) by mouth daily. 30 tablet 0   . folic acid (FOLVITE) 1 MG tablet Take 1 tablet (1 mg total) by mouth daily. 30 tablet 1   . gabapentin (NEURONTIN) 100 MG capsule Take 2 capsules (200 mg total) by mouth 2 (two) times daily. 60 capsule 0   . lisinopril (ZESTRIL) 5 MG tablet Take 1 tablet (5 mg total) by mouth at bedtime. 30 tablet 0   . Multiple Vitamin (MULTIVITAMIN WITH MINERALS) TABS tablet Take 1 tablet by mouth daily. 30 tablet 0   . oxyCODONE  (ROXICODONE) 5 MG immediate release tablet Take 1 tablet (5 mg total) by mouth every 8 (eight) hours as needed. 20 tablet 0   . oxyCODONE-acetaminophen (PERCOCET) 5-325 MG tablet Take 1 tablet by mouth every 4 (four) hours as needed for severe pain. 20 tablet 0   . pantoprazole (PROTONIX) 40 MG tablet Take 1 tablet (40 mg total) by mouth 2 (two) times daily. 60 tablet 1   . sertraline (ZOLOFT) 50 MG tablet Take 1 tablet (50 mg total) by mouth daily. 90 tablet 1   . thiamine 100 MG tablet Take 1 tablet (100 mg total) by mouth daily. 30 tablet 0   . thiamine 100 MG tablet Take 1 tablet (100 mg total) by mouth daily. 30 tablet 0   . traZODone (DESYREL) 100 MG tablet Take 100 mg by mouth at bedtime.     . vitamin B-12 (CYANOCOBALAMIN) 100 MCG tablet Take 100 mcg by mouth daily.     Marland Kitchen zinc sulfate 220 (50 Zn) MG capsule Take 1 capsule (220 mg total) by mouth daily. 30 capsule 0      Allergies  Allergen Reactions  . Amantadine Other (See Comments)    fatigue sleepy Other reaction(s): Other (See Comments) fatigue sleepy fatigue sleepy fatigue sleepy     Past Medical History:  Diagnosis Date  . Abnormal liver function   . Anemia   .  Anxiety   . Atrial fibrillation ([place])   . Cancer ([place]) 2017   skin  . Colonic mass   . Depression   . Dizziness   . Dry cough   . History of colon polyps   . Hypercholesteremia   . Hyperlipemia   . Hypertension   . Palpitations   . Parkinson's disease ([place])   . Parkinson's disease ([place])   . PVD (peripheral vascular disease) ([place])   . Tremors of nervous system     Review of systems:  Otherwise negative.    Physical Exam  Gen: Alert, oriented. Appears stated age.  HEENT: [place]/AT. PERRLA. Lungs: No respiratory distress Abd: soft, benign, no masses.  Ext: No edema.     Planned procedures: Proceed with EGD. The patient understands the nature of the planned procedure, indications, risks, alternatives and potential complications including but not  limited to bleeding, infection, perforation, damage to internal organs and possible oversedation/side effects from anesthesia. The patient agrees and gives consent to proceed.  Please refer to procedure notes for findings, recommendations and patient disposition/instructions.     Charles Miyamoto MD, MPH Gastroenterology 10/15/2019  12:30 PM

## 2019-10-15 NOTE — Anesthesia Preprocedure Evaluation (Signed)
Anesthesia Evaluation  Patient identified by MRN, date of birth, ID band Patient awake  General Assessment Comment:Visible strong tremor throughout  Reviewed: Allergy & Precautions, H&P , NPO status , Patient's Chart, lab work & pertinent test results  History of Anesthesia Complications Negative for: history of anesthetic complications  Airway Mallampati: III  TM Distance: <3 FB Neck ROM: limited    Dental  (+) Chipped, Poor Dentition, Missing, Lower Dentures, Partial Upper, Dental Advisory Given   Pulmonary neg pulmonary ROS, neg shortness of breath,    breath sounds clear to auscultation       Cardiovascular Exercise Tolerance: Good hypertension, (-) angina+ Peripheral Vascular Disease  (-) Past MI and (-) PND + dysrhythmias Atrial Fibrillation  Rhythm:Regular Rate:Tachycardia - Systolic murmurs    Neuro/Psych PSYCHIATRIC DISORDERS Anxiety Depression parkinson's  Neuromuscular disease    GI/Hepatic Neg liver ROS, PUD, GERD  ,  Endo/Other  diabetes, Type 2  Renal/GU negative Renal ROS  negative genitourinary   Musculoskeletal   Abdominal   Peds  Hematology negative hematology ROS (+)   Anesthesia Other Findings Past Medical History: No date: Abnormal liver function No date: Anemia No date: Anxiety No date: Atrial fibrillation (HCC) No date: Colonic mass No date: Depression No date: Dizziness No date: Dry cough No date: History of colon polyps No date: Hypercholesteremia No date: Hyperlipemia No date: Hypertension No date: Palpitations No date: Parkinson's disease ([place]) No date: Parkinson's disease ([place]) No date: PVD (peripheral vascular disease) ([place]) No date: Tremors of nervous system  Past Surgical History: No date: BACK SURGERY     Comment:  Kyphoplasty   March 2018 No date: COLONOSCOPY 09/21/2016: ESOPHAGOGASTRODUODENOSCOPY (EGD) WITH PROPOFOL; N/A     Comment:  Procedure:  ESOPHAGOGASTRODUODENOSCOPY (EGD) WITH               PROPOFOL;  Surgeon: Jonathon Bellows, MD;  Location: [facility]               ENDOSCOPY;  Service: Gastroenterology;  Laterality: N/A; 11/07/2016: ESOPHAGOGASTRODUODENOSCOPY (EGD) WITH PROPOFOL; N/A     Comment:  Procedure: ESOPHAGOGASTRODUODENOSCOPY (EGD) WITH               PROPOFOL;  Surgeon: Jonathon Bellows, MD;  Location: [facility]               ENDOSCOPY;  Service: Gastroenterology;  Laterality: N/A; No date: HERNIA REPAIR     Comment:  left inguinal hernia repair as an infant 02/11/2016: KYPHOPLASTY; N/A     Comment:  Procedure: KYPHOPLASTY  L2,T9;  Surgeon: Hessie Knows,               MD;  Location: [facility] ORS;  Service: Orthopedics;                Laterality: N/A; 03/15/2016: KYPHOPLASTY; N/A     Comment:  Procedure: KYPHOPLASTY L3;  Surgeon: Hessie Knows, MD;                Location: [facility] ORS;  Service: Orthopedics;  Laterality:               N/A; No date: skin cancer removed No date: TONSILLECTOMY  BMI    Body Mass Index:  26.35 kg/m      Reproductive/Obstetrics negative OB ROS                             Anesthesia Physical  Anesthesia Plan  ASA: III  Anesthesia Plan: General  Post-op Pain Management:    Induction: Intravenous  PONV Risk Score and Plan: 2 and Propofol infusion, TIVA and Ondansetron  Airway Management Planned: Natural Airway and Nasal Cannula  Additional Equipment: None  Intra-op Plan:   Post-operative Plan:   Informed Consent: I have reviewed the patients History and Physical, chart, labs and discussed the procedure including the risks, benefits and alternatives for the proposed anesthesia with the patient or authorized representative who has indicated his/her understanding and acceptance.     Dental Advisory Given  Plan Discussed with: CRNA and Surgeon  Anesthesia Plan Comments: (Discussed risks of anesthesia with patient, including possibility of difficulty with spontaneous  ventilation under anesthesia necessitating airway intervention, PONV, and rare risks such as cardiac or respiratory or neurological events. Patient understands.)        Anesthesia Quick Evaluation

## 2019-10-15 NOTE — Anesthesia Postprocedure Evaluation (Signed)
Anesthesia Post Note  Patient: Charles Stanton  Procedure(s) Performed: ESOPHAGOGASTRODUODENOSCOPY (EGD) WITH PROPOFOL (N/A )  Patient location during evaluation: Endoscopy Anesthesia Type: General Level of consciousness: awake and alert Pain management: pain level controlled Vital Signs Assessment: post-procedure vital signs reviewed and stable Respiratory status: spontaneous breathing, nonlabored ventilation, respiratory function stable and patient connected to nasal cannula oxygen Cardiovascular status: blood pressure returned to baseline and stable Postop Assessment: no apparent nausea or vomiting Anesthetic complications: no   No complications documented.   Last Vitals:  Vitals:   10/15/19 1218 10/15/19 1313  BP: (!) 142/78 128/77  Pulse: (!) 114   Resp: 20 20  Temp: (!) 35.9 C   SpO2: 100%     Last Pain:  Vitals:   10/15/19 1313  TempSrc:   PainSc: 0-No pain                 Arita Miss

## 2019-10-15 NOTE — Op Note (Signed)
[facility] Gastroenterology Patient Name: Charles Stanton Procedure Date: 10/15/2019 12:45 PM MRN: [id number] Account #: [id number] Date of Birth: [birth year] Admit Type: Outpatient Age: 64 Room: [facility] ENDO ROOM 3 Gender: Male Note Status: Finalized Procedure:             Upper GI endoscopy Indications:           Abnormal UGI series Providers:             Andrey Farmer MD, MD Referring MD:          [name], MD (Referring MD) Medicines:             Monitored Anesthesia Care Complications:         No immediate complications. Estimated blood loss:                         Minimal. Procedure:             Pre-Anesthesia Assessment:                        - Prior to the procedure, a History and Physical was                         performed, and patient medications and allergies were                         reviewed. The patient is competent. The risks and                         benefits of the procedure and the sedation options and                         risks were discussed with the patient. All questions                         were answered and informed consent was obtained.                         Patient identification and proposed procedure were                         verified by the physician, the nurse, the anesthetist                         and the technician in the endoscopy suite. Mental                         Status Examination: alert and oriented. Airway                         Examination: normal oropharyngeal airway and neck                         mobility. Respiratory Examination: clear to                         auscultation. CV Examination: normal. Prophylactic  Antibiotics: The patient does not require prophylactic                         antibiotics. Prior Anticoagulants: The patient has                         taken no previous anticoagulant or antiplatelet                         agents. ASA Grade Assessment:  III - A patient with                         severe systemic disease. After reviewing the risks and                         benefits, the patient was deemed in satisfactory                         condition to undergo the procedure. The anesthesia                         plan was to use monitored anesthesia care (MAC).                         Immediately prior to administration of medications,                         the patient was re-assessed for adequacy to receive                         sedatives. The heart rate, respiratory rate, oxygen                         saturations, blood pressure, adequacy of pulmonary                         ventilation, and response to care were monitored                         throughout the procedure. The physical status of the                         patient was re-assessed after the procedure.                        After obtaining informed consent, the endoscope was                         passed under direct vision. Throughout the procedure,                         the patient's blood pressure, pulse, and oxygen                         saturations were monitored continuously. The Endoscope                         was introduced through the mouth, and advanced to the  second part of duodenum. The upper GI endoscopy was                         technically difficult and complex due to narrowing.                         The patient tolerated the procedure well. Findings:      One benign-appearing, intrinsic severe (stenosis; an endoscope cannot       pass) stenosis was found 25 cm from the incisors. This stenosis measured       7 mm (inner diameter) x 1 cm (in length). The stenosis was traversed       after downsizing scope and dilating. A TTS dilator was passed through       the scope. Dilation with an 08-11-08 mm balloon dilator was performed to 9       mm. The dilation site was examined and showed moderate improvement in        luminal narrowing. Estimated blood loss was minimal. The mucosa just       distal to stricture was erythematous and could be consistent with       Barrett's. Will need biopsies after dilation procedures are complete.      A large hiatal hernia was present.      A single 10 mm papule (nodule) with no stigmata of recent bleeding was       found in the gastric antrum. This has been noted previously and biopsied.      The examined duodenum was normal. Impression:            - Benign-appearing esophageal stenosis. Dilated.                        - Large hiatal hernia.                        - A single papule (nodule) found in the stomach.                        - Normal examined duodenum.                        - No specimens collected. Recommendation:        - Discharge patient to home.                        - Pureed diet.                        - Use Protonix (pantoprazole) 40 mg PO BID.                        - Repeat upper endoscopy in 2 weeks for retreatment.                        - Return to referring physician as previously                         scheduled. Procedure Code(s):     --- Professional ---                        [phone], Esophagogastroduodenoscopy,  flexible,                         transoral; with transendoscopic balloon dilation of                         esophagus (less than 30 mm diameter) Diagnosis Code(s):     --- Professional ---                        K22.2, Esophageal obstruction                        K44.9, Diaphragmatic hernia without obstruction or                         gangrene                        K31.89, Other diseases of stomach and duodenum                        R93.3, Abnormal findings on diagnostic imaging of                         other parts of digestive tract CPT copyright 2019 American Medical Association. All rights reserved. The codes documented in this report are preliminary and upon coder review may  be revised to meet current compliance  requirements. Andrey Farmer, MD Andrey Farmer MD, MD 10/15/2019 1:17:00 PM Number of Addenda: 0 Note Initiated On: 10/15/2019 12:45 PM Estimated Blood Loss:  Estimated blood loss was minimal.      [facility]

## 2019-10-15 NOTE — Transfer of Care (Signed)
Immediate Anesthesia Transfer of Care Note  Patient: Desmin Daleo Person  Procedure(s) Performed: ESOPHAGOGASTRODUODENOSCOPY (EGD) WITH PROPOFOL (N/A )  Patient Location: PACU  Anesthesia Type:General  Level of Consciousness: awake, alert  and oriented  Airway & Oxygen Therapy: Patient Spontanous Breathing and Patient connected to face mask oxygen  Post-op Assessment: Report given to RN and Post -op Vital signs reviewed and stable  Post vital signs: Reviewed and stable  Last Vitals:  Vitals Value Taken Time  BP 150/115 10/15/19 1315  Temp    Pulse 109 10/15/19 1315  Resp 16 10/15/19 1315  SpO2 100 % 10/15/19 1315  Vitals shown include unvalidated device data.  Last Pain:  Vitals:   10/15/19 1313  TempSrc:   PainSc: 0-No pain         Complications: No complications documented.

## 2019-10-15 NOTE — Interval H&P Note (Signed)
History and Physical Interval Note:  10/15/2019 12:33 PM  Charles Stanton  has presented today for surgery, with the diagnosis of Weiner.  The various methods of treatment have been discussed with the patient and family. After consideration of risks, benefits and other options for treatment, the patient has consented to  Procedure(s): ESOPHAGOGASTRODUODENOSCOPY (EGD) WITH PROPOFOL (N/A) as a surgical intervention.  The patient's history has been reviewed, patient examined, no change in status, stable for surgery.  I have reviewed the patient's chart and labs.  Questions were answered to the patient's satisfaction.     [name]  Ok to proceed with EGD

## 2019-10-16 ENCOUNTER — Encounter: Payer: Self-pay | Admitting: Gastroenterology

## 2019-10-24 ENCOUNTER — Other Ambulatory Visit
Admission: RE | Admit: 2019-10-24 | Discharge: 2019-10-24 | Disposition: A | Discharge status: Home / Self Care | Payer: Medicare Other | Source: Ambulatory Visit | Attending: Gastroenterology | Admitting: Gastroenterology

## 2019-10-24 ENCOUNTER — Other Ambulatory Visit: Payer: Self-pay

## 2019-10-24 DIAGNOSIS — Z01812 Encounter for preprocedural laboratory examination: Secondary | ICD-10-CM | POA: Insufficient documentation

## 2019-10-24 DIAGNOSIS — Z20822 Contact with and (suspected) exposure to covid-19: Secondary | ICD-10-CM | POA: Diagnosis not present

## 2019-10-25 ENCOUNTER — Encounter: Payer: Self-pay | Admitting: *Deleted

## 2019-10-25 LAB — SARS CORONAVIRUS 2 (TAT 6-24 HRS): SARS Coronavirus 2: NEGATIVE

## 2019-10-28 ENCOUNTER — Ambulatory Visit
Admission: RE | Admit: 2019-10-28 | Discharge: 2019-10-28 | Disposition: A | Discharge status: Home / Self Care | Payer: Medicare Other | Attending: Gastroenterology | Admitting: Gastroenterology

## 2019-10-28 ENCOUNTER — Encounter: Payer: Self-pay | Admitting: *Deleted

## 2019-10-28 ENCOUNTER — Encounter
Admission: RE | Disposition: A | Discharge status: Home / Self Care | Payer: Self-pay | Source: Home / Self Care | Attending: Gastroenterology

## 2019-10-28 ENCOUNTER — Ambulatory Visit: Payer: Medicare Other | Admitting: Anesthesiology

## 2019-10-28 ENCOUNTER — Other Ambulatory Visit: Payer: Self-pay

## 2019-10-28 DIAGNOSIS — Z86010 Personal history of colonic polyps: Secondary | ICD-10-CM | POA: Insufficient documentation

## 2019-10-28 DIAGNOSIS — R000 Tachycardia, unspecified: Secondary | ICD-10-CM | POA: Insufficient documentation

## 2019-10-28 DIAGNOSIS — Z79899 Other long term (current) drug therapy: Secondary | ICD-10-CM | POA: Diagnosis not present

## 2019-10-28 DIAGNOSIS — E7800 Pure hypercholesterolemia, unspecified: Secondary | ICD-10-CM | POA: Insufficient documentation

## 2019-10-28 DIAGNOSIS — E785 Hyperlipidemia, unspecified: Secondary | ICD-10-CM | POA: Diagnosis not present

## 2019-10-28 DIAGNOSIS — K219 Gastro-esophageal reflux disease without esophagitis: Secondary | ICD-10-CM | POA: Insufficient documentation

## 2019-10-28 DIAGNOSIS — I10 Essential (primary) hypertension: Secondary | ICD-10-CM | POA: Diagnosis not present

## 2019-10-28 DIAGNOSIS — E1151 Type 2 diabetes mellitus with diabetic peripheral angiopathy without gangrene: Secondary | ICD-10-CM | POA: Diagnosis not present

## 2019-10-28 DIAGNOSIS — D649 Anemia, unspecified: Secondary | ICD-10-CM | POA: Diagnosis not present

## 2019-10-28 DIAGNOSIS — R002 Palpitations: Secondary | ICD-10-CM | POA: Diagnosis not present

## 2019-10-28 DIAGNOSIS — F419 Anxiety disorder, unspecified: Secondary | ICD-10-CM | POA: Insufficient documentation

## 2019-10-28 DIAGNOSIS — F32A Depression, unspecified: Secondary | ICD-10-CM | POA: Insufficient documentation

## 2019-10-28 DIAGNOSIS — Z888 Allergy status to other drugs, medicaments and biological substances status: Secondary | ICD-10-CM | POA: Diagnosis not present

## 2019-10-28 DIAGNOSIS — K222 Esophageal obstruction: Secondary | ICD-10-CM | POA: Diagnosis not present

## 2019-10-28 DIAGNOSIS — G20 Parkinson's disease: Secondary | ICD-10-CM | POA: Insufficient documentation

## 2019-10-28 DIAGNOSIS — K279 Peptic ulcer, site unspecified, unspecified as acute or chronic, without hemorrhage or perforation: Secondary | ICD-10-CM | POA: Insufficient documentation

## 2019-10-28 DIAGNOSIS — I4891 Unspecified atrial fibrillation: Secondary | ICD-10-CM | POA: Diagnosis not present

## 2019-10-28 HISTORY — PX: ESOPHAGOGASTRODUODENOSCOPY (EGD) WITH PROPOFOL: SHX5813

## 2019-10-28 SURGERY — ESOPHAGOGASTRODUODENOSCOPY (EGD) WITH PROPOFOL
Anesthesia: General

## 2019-10-28 MED ORDER — SODIUM CHLORIDE 0.9 % IV SOLN
INTRAVENOUS | Status: DC
  Administered 2019-10-28: 1000 mL via INTRAVENOUS

## 2019-10-28 MED ORDER — PROPOFOL 500 MG/50ML IV EMUL
INTRAVENOUS | Status: AC
  Filled 2019-10-28: qty 50

## 2019-10-28 MED ORDER — PROPOFOL 10 MG/ML IV BOLUS
INTRAVENOUS | Status: DC | PRN
  Administered 2019-10-28 (×3): 20 mg via INTRAVENOUS
  Administered 2019-10-28: 80 mg via INTRAVENOUS
  Administered 2019-10-28 (×3): 20 mg via INTRAVENOUS

## 2019-10-28 NOTE — Anesthesia Postprocedure Evaluation (Signed)
Anesthesia Post Note  Patient: Charles Stanton  Procedure(s) Performed: ESOPHAGOGASTRODUODENOSCOPY (EGD) WITH PROPOFOL (N/A )  Patient location during evaluation: Endoscopy Anesthesia Type: General Level of consciousness: awake and alert Pain management: pain level controlled Vital Signs Assessment: post-procedure vital signs reviewed and stable Respiratory status: spontaneous breathing, nonlabored ventilation, respiratory function stable and patient connected to nasal cannula oxygen Cardiovascular status: blood pressure returned to baseline and stable Postop Assessment: no apparent nausea or vomiting Anesthetic complications: no   No complications documented.   Last Vitals:  Vitals:   10/28/19 0826 10/28/19 0856  BP: 118/76 (!) 145/83  Pulse:    Resp: 16   Temp: (!) 36.3 C   SpO2: 96%     Last Pain:  Vitals:   10/28/19 0856  TempSrc:   PainSc: 0-No pain                 Martha Clan

## 2019-10-28 NOTE — H&P (Signed)
Outpatient short stay form Pre-procedure 10/28/2019 7:59 AM Charles Stanton  Primary Physician: Dr. Ginette Pitman  Reason for visit:  Stricture  History of present illness:   64 y/o gentleman with history of Parkinson's and high grade stricture dilated to 9 mm last time. Here for repeat dilation. No blood thinners. He is now compliant with PPI.    Current Facility-Administered Medications:  .  0.9 %  sodium chloride infusion, , Intravenous, Continuous, Duaa Stelzner, Hilton Cork, MD, Last Rate: 20 mL/hr at 10/28/19 0745, 1,000 mL at 10/28/19 0745  Medications Prior to Admission  Medication Sig Dispense Refill Last Dose  . ascorbic acid (VITAMIN C) 500 MG tablet Take 1 tablet (500 mg total) by mouth daily. 30 tablet 0 10/27/2019 at Unknown time  . baclofen (LIORESAL) 10 MG tablet Take 10 mg by mouth daily.   0 10/27/2019 at Unknown time  . buPROPion (WELLBUTRIN XL) 150 MG 24 hr tablet Take 150 mg by mouth daily.   10/27/2019 at Unknown time  . carbidopa-levodopa (SINEMET CR) 50-200 MG tablet Take 1 tablet by mouth at bedtime.   10/28/2019 at 0615  . carbidopa-levodopa (SINEMET IR) 25-100 MG tablet TK 1 T PO four times a day  1 10/28/2019 at [place] time  . cholecalciferol (VITAMIN D) 25 MCG tablet Take 1 tablet (1,000 Units total) by mouth daily. 30 tablet 0 10/27/2019 at Unknown time  . folic acid (FOLVITE) 1 MG tablet Take 1 tablet (1 mg total) by mouth daily. 30 tablet 1 10/27/2019 at Unknown time  . gabapentin (NEURONTIN) 100 MG capsule Take 2 capsules (200 mg total) by mouth 2 (two) times daily. 60 capsule 0 10/27/2019 at Unknown time  . lisinopril (ZESTRIL) 5 MG tablet Take 1 tablet (5 mg total) by mouth at bedtime. 30 tablet 0 10/27/2019 at Unknown time  . Multiple Vitamin (MULTIVITAMIN WITH MINERALS) TABS tablet Take 1 tablet by mouth daily. 30 tablet 0 10/27/2019 at Unknown time  . oxyCODONE-acetaminophen (PERCOCET) 5-325 MG tablet Take 1 tablet by mouth every 4 (four) hours as needed for  severe pain. 20 tablet 0 Past Week at Unknown time  . pantoprazole (PROTONIX) 40 MG tablet Take 1 tablet (40 mg total) by mouth 2 (two) times daily. 60 tablet 1 10/27/2019 at Unknown time  . sertraline (ZOLOFT) 50 MG tablet Take 1 tablet (50 mg total) by mouth daily. 90 tablet 1 10/27/2019 at Unknown time  . thiamine 100 MG tablet Take 1 tablet (100 mg total) by mouth daily. 30 tablet 0 10/27/2019 at Unknown time  . traZODone (DESYREL) 100 MG tablet Take 100 mg by mouth at bedtime.   10/27/2019 at Unknown time  . vitamin B-12 (CYANOCOBALAMIN) 100 MCG tablet Take 100 mcg by mouth daily.   10/27/2019 at Unknown time  . zinc sulfate 220 (50 Zn) MG capsule Take 1 capsule (220 mg total) by mouth daily. 30 capsule 0 10/27/2019 at Unknown time  . amantadine (SYMMETREL) 100 MG capsule Take 50 mg by mouth daily. (Patient not taking: Reported on 10/28/2019)   Not Taking at Unknown time  . Carbidopa-Levodopa ER (RYTARY) 23.75-95 MG CPCR Take 1 capsule by mouth daily.     Marland Kitchen oxyCODONE (ROXICODONE) 5 MG immediate release tablet Take 1 tablet (5 mg total) by mouth every 8 (eight) hours as needed. 20 tablet 0   . thiamine 100 MG tablet Take 1 tablet (100 mg total) by mouth daily. 30 tablet 0      Allergies  Allergen Reactions  . Amantadine Other (  See Comments)    fatigue sleepy Other reaction(s): Other (See Comments) fatigue sleepy fatigue sleepy fatigue sleepy     Past Medical History:  Diagnosis Date  . Abnormal liver function   . Anemia   . Anxiety   . Atrial fibrillation ([place])   . Cancer ([place]) 2017   skin  . Colonic mass   . Depression   . Dizziness   . Dry cough   . History of colon polyps   . Hypercholesteremia   . Hyperlipemia   . Hypertension   . Palpitations   . Parkinson's disease ([place])   . Parkinson's disease ([place])   . PVD (peripheral vascular disease) ([place])   . Tonsillitis 03/25/2013  . Tremors of nervous system     Review of systems:  Otherwise negative.    Physical  Exam  Gen: Alert, oriented. Appears stated age.  HEENT: [place]/AT. PERRLA. Lungs: No respiratory distress CV: RRR Abd: soft, benign, no masses. Ext: No edema.     Planned procedures: Proceed with EGD. The patient understands the nature of the planned procedure, indications, risks, alternatives and potential complications including but not limited to bleeding, infection, perforation, damage to internal organs and possible oversedation/side effects from anesthesia. The patient agrees and gives consent to proceed.  Please refer to procedure notes for findings, recommendations and patient disposition/instructions.     Charles Miyamoto MD, MPH Gastroenterology 10/28/2019  7:59 AM

## 2019-10-28 NOTE — Interval H&P Note (Signed)
History and Physical Interval Note:  10/28/2019 8:01 AM  Charles Stanton  has presented today for surgery, with the diagnosis of ESOPHAGEAL STRICTURE.  The various methods of treatment have been discussed with the patient and family. After consideration of risks, benefits and other options for treatment, the patient has consented to  Procedure(s): ESOPHAGOGASTRODUODENOSCOPY (EGD) WITH PROPOFOL (N/A) as a surgical intervention.  The patient's history has been reviewed, patient examined, no change in status, stable for surgery.  I have reviewed the patient's chart and labs.  Questions were answered to the patient's satisfaction.     [name]  Ok to proceed with EGD

## 2019-10-28 NOTE — Op Note (Signed)
[facility] Gastroenterology Patient Name: Charles Stanton Procedure Date: 10/28/2019 8:04 AM MRN: [id number] Account #: [id number] Date of Birth: [birth year] Admit Type: Outpatient Age: 64 Room: [facility] ENDO ROOM 4 Gender: Male Note Status: Finalized Procedure:             Upper GI endoscopy Indications:           Stricture of the esophagus Providers:             Andrey Farmer MD, MD Medicines:             Monitored Anesthesia Care Complications:         No immediate complications. Estimated blood loss:                         Minimal. Procedure:             Pre-Anesthesia Assessment:                        - Prior to the procedure, a History and Physical was                         performed, and patient medications and allergies were                         reviewed. The patient is competent. The risks and                         benefits of the procedure and the sedation options and                         risks were discussed with the patient. All questions                         were answered and informed consent was obtained.                         Patient identification and proposed procedure were                         verified by the physician, the nurse, the anesthetist                         and the technician in the endoscopy suite. Mental                         Status Examination: alert and oriented. Airway                         Examination: normal oropharyngeal airway and neck                         mobility. Respiratory Examination: clear to                         auscultation. CV Examination: normal. Prophylactic                         Antibiotics: The patient does not require prophylactic  antibiotics. Prior Anticoagulants: The patient has                         taken no previous anticoagulant or antiplatelet                         agents. ASA Grade Assessment: III - A patient with                         severe  systemic disease. After reviewing the risks and                         benefits, the patient was deemed in satisfactory                         condition to undergo the procedure. The anesthesia                         plan was to use monitored anesthesia care (MAC).                         Immediately prior to administration of medications,                         the patient was re-assessed for adequacy to receive                         sedatives. The heart rate, respiratory rate, oxygen                         saturations, blood pressure, adequacy of pulmonary                         ventilation, and response to care were monitored                         throughout the procedure. The physical status of the                         patient was re-assessed after the procedure.                        After obtaining informed consent, the endoscope was                         passed under direct vision. Throughout the procedure,                         the patient's blood pressure, pulse, and oxygen                         saturations were monitored continuously. The Endoscope                         was introduced through the mouth, and advanced to the                         upper third of esophagus. The upper GI endoscopy was  technically difficult and complex due to narrowing.                         The patient tolerated the procedure well. Findings:      One benign-appearing, intrinsic severe stenosis was found 25 cm from the       incisors. This stenosis measured 9 mm (inner diameter) x less than one       cm (in length). The stenosis was not traversed. A TTS dilator was passed       through the scope. Dilation with an 08-11-08 mm balloon and a 10-14-10 mm       balloon dilator was performed to 11 mm. The dilation site was examined       and showed moderate improvement in luminal narrowing. Estimated blood       loss was minimal. Impression:            -  Benign-appearing esophageal stenosis. Dilated.                        - No specimens collected. Recommendation:        - Discharge patient to home.                        - Soft diet.                        - Continue present medications.                        - Repeat upper endoscopy in 3 weeks for retreatment.                        - Return to referring physician as previously                         scheduled. Procedure Code(s):     --- Professional ---                        [phone], Esophagoscopy, flexible, transoral; with                         transendoscopic balloon dilation (less than 30 mm                         diameter) Diagnosis Code(s):     --- Professional ---                        K22.2, Esophageal obstruction CPT copyright 2019 American Medical Association. All rights reserved. The codes documented in this report are preliminary and upon coder review may  be revised to meet current compliance requirements. Andrey Farmer, MD Andrey Farmer MD, MD 10/28/2019 8:27:17 AM Number of Addenda: 0 Note Initiated On: 10/28/2019 8:04 AM Estimated Blood Loss:  Estimated blood loss was minimal.      [facility]

## 2019-10-28 NOTE — Transfer of Care (Signed)
Immediate Anesthesia Transfer of Care Note  Patient: Charles Stanton  Procedure(s) Performed: ESOPHAGOGASTRODUODENOSCOPY (EGD) WITH PROPOFOL (N/A )  Patient Location: Endoscopy Unit  Anesthesia Type:General  Level of Consciousness: drowsy and patient cooperative  Airway & Oxygen Therapy: Patient Spontanous Breathing  Post-op Assessment: Report given to RN and Post -op Vital signs reviewed and stable  Post vital signs: Reviewed and stable  Last Vitals:  Vitals Value Taken Time  BP 118/76 10/28/19 0826  Temp 36.3 C 10/28/19 0826  Pulse 70 10/28/19 0827  Resp 23 10/28/19 0827  SpO2 94 % 10/28/19 0827  Vitals shown include unvalidated device data.  Last Pain:  Vitals:   10/28/19 0826  TempSrc:   PainSc: 0-No pain         Complications: No complications documented.

## 2019-10-28 NOTE — Anesthesia Preprocedure Evaluation (Signed)
Anesthesia Evaluation  Patient identified by MRN, date of birth, ID band Patient awake  General Assessment Comment:Visible strong tremor throughout  Reviewed: Allergy & Precautions, H&P , NPO status , Patient's Chart, lab work & pertinent test results  History of Anesthesia Complications Negative for: history of anesthetic complications  Airway Mallampati: III  TM Distance: <3 FB Neck ROM: limited    Dental  (+) Chipped, Poor Dentition, Missing, Lower Dentures, Partial Upper, Dental Advisory Given   Pulmonary neg pulmonary ROS, neg shortness of breath,    breath sounds clear to auscultation       Cardiovascular Exercise Tolerance: Good hypertension, (-) angina+ Peripheral Vascular Disease  (-) Past MI and (-) PND + dysrhythmias Atrial Fibrillation  Rhythm:Regular Rate:Tachycardia - Systolic murmurs    Neuro/Psych PSYCHIATRIC DISORDERS Anxiety Depression parkinson's  Neuromuscular disease    GI/Hepatic Neg liver ROS, PUD, GERD  ,  Endo/Other  diabetes, Type 2  Renal/GU negative Renal ROS  negative genitourinary   Musculoskeletal   Abdominal   Peds  Hematology negative hematology ROS (+)   Anesthesia Other Findings Past Medical History: No date: Abnormal liver function No date: Anemia No date: Anxiety No date: Atrial fibrillation (HCC) No date: Colonic mass No date: Depression No date: Dizziness No date: Dry cough No date: History of colon polyps No date: Hypercholesteremia No date: Hyperlipemia No date: Hypertension No date: Palpitations No date: Parkinson's disease ([place]) No date: Parkinson's disease ([place]) No date: PVD (peripheral vascular disease) ([place]) No date: Tremors of nervous system  Past Surgical History: No date: BACK SURGERY     Comment:  Kyphoplasty   March 2018 No date: COLONOSCOPY 09/21/2016: ESOPHAGOGASTRODUODENOSCOPY (EGD) WITH PROPOFOL; N/A     Comment:  Procedure:  ESOPHAGOGASTRODUODENOSCOPY (EGD) WITH               PROPOFOL;  Surgeon: Jonathon Bellows, MD;  Location: [facility]               ENDOSCOPY;  Service: Gastroenterology;  Laterality: N/A; 11/07/2016: ESOPHAGOGASTRODUODENOSCOPY (EGD) WITH PROPOFOL; N/A     Comment:  Procedure: ESOPHAGOGASTRODUODENOSCOPY (EGD) WITH               PROPOFOL;  Surgeon: Jonathon Bellows, MD;  Location: [facility]               ENDOSCOPY;  Service: Gastroenterology;  Laterality: N/A; No date: HERNIA REPAIR     Comment:  left inguinal hernia repair as an infant 02/11/2016: KYPHOPLASTY; N/A     Comment:  Procedure: KYPHOPLASTY  L2,T9;  Surgeon: Hessie Knows,               MD;  Location: [facility] ORS;  Service: Orthopedics;                Laterality: N/A; 03/15/2016: KYPHOPLASTY; N/A     Comment:  Procedure: KYPHOPLASTY L3;  Surgeon: Hessie Knows, MD;                Location: [facility] ORS;  Service: Orthopedics;  Laterality:               N/A; No date: skin cancer removed No date: TONSILLECTOMY  BMI    Body Mass Index:  26.35 kg/m      Reproductive/Obstetrics negative OB ROS                             Anesthesia Physical  Anesthesia Plan  ASA: III  Anesthesia Plan: General  Post-op Pain Management:    Induction: Intravenous  PONV Risk Score and Plan: 2 and Propofol infusion and TIVA  Airway Management Planned: Natural Airway and Nasal Cannula  Additional Equipment: None  Intra-op Plan:   Post-operative Plan:   Informed Consent: I have reviewed the patients History and Physical, chart, labs and discussed the procedure including the risks, benefits and alternatives for the proposed anesthesia with the patient or authorized representative who has indicated his/her understanding and acceptance.     Dental Advisory Given  Plan Discussed with: CRNA and Surgeon  Anesthesia Plan Comments: (Discussed risks of anesthesia with patient, including possibility of difficulty with spontaneous ventilation under  anesthesia necessitating airway intervention, PONV, and rare risks such as cardiac or respiratory or neurological events. Patient understands.)        Anesthesia Quick Evaluation

## 2019-10-30 ENCOUNTER — Encounter: Payer: Self-pay | Admitting: Gastroenterology

## 2019-11-29 ENCOUNTER — Other Ambulatory Visit: Payer: Self-pay

## 2019-11-29 ENCOUNTER — Other Ambulatory Visit
Admission: RE | Admit: 2019-11-29 | Discharge: 2019-11-29 | Disposition: A | Discharge status: Home / Self Care | Payer: Medicare Other | Source: Ambulatory Visit | Attending: Gastroenterology | Admitting: Gastroenterology

## 2019-11-29 DIAGNOSIS — Z01818 Encounter for other preprocedural examination: Secondary | ICD-10-CM | POA: Diagnosis present

## 2019-11-29 DIAGNOSIS — Z20822 Contact with and (suspected) exposure to covid-19: Secondary | ICD-10-CM | POA: Diagnosis not present

## 2019-11-30 LAB — SARS CORONAVIRUS 2 (TAT 6-24 HRS): SARS Coronavirus 2: NEGATIVE

## 2019-12-02 ENCOUNTER — Ambulatory Visit: Payer: Medicare Other | Admitting: Anesthesiology

## 2019-12-02 ENCOUNTER — Encounter
Admission: RE | Disposition: A | Discharge status: Home / Self Care | Payer: Self-pay | Source: Home / Self Care | Attending: Gastroenterology

## 2019-12-02 ENCOUNTER — Ambulatory Visit
Admission: RE | Admit: 2019-12-02 | Discharge: 2019-12-02 | Disposition: A | Discharge status: Home / Self Care | Payer: Medicare Other | Attending: Gastroenterology | Admitting: Gastroenterology

## 2019-12-02 ENCOUNTER — Other Ambulatory Visit: Payer: Self-pay

## 2019-12-02 DIAGNOSIS — G20 Parkinson's disease: Secondary | ICD-10-CM | POA: Diagnosis not present

## 2019-12-02 DIAGNOSIS — Z79899 Other long term (current) drug therapy: Secondary | ICD-10-CM | POA: Diagnosis not present

## 2019-12-02 DIAGNOSIS — I10 Essential (primary) hypertension: Secondary | ICD-10-CM | POA: Diagnosis not present

## 2019-12-02 DIAGNOSIS — I4891 Unspecified atrial fibrillation: Secondary | ICD-10-CM | POA: Diagnosis not present

## 2019-12-02 DIAGNOSIS — K222 Esophageal obstruction: Secondary | ICD-10-CM | POA: Diagnosis present

## 2019-12-02 DIAGNOSIS — Z86010 Personal history of colonic polyps: Secondary | ICD-10-CM | POA: Diagnosis not present

## 2019-12-02 DIAGNOSIS — D649 Anemia, unspecified: Secondary | ICD-10-CM | POA: Diagnosis not present

## 2019-12-02 DIAGNOSIS — E785 Hyperlipidemia, unspecified: Secondary | ICD-10-CM | POA: Diagnosis not present

## 2019-12-02 DIAGNOSIS — Z888 Allergy status to other drugs, medicaments and biological substances status: Secondary | ICD-10-CM | POA: Insufficient documentation

## 2019-12-02 DIAGNOSIS — K279 Peptic ulcer, site unspecified, unspecified as acute or chronic, without hemorrhage or perforation: Secondary | ICD-10-CM | POA: Insufficient documentation

## 2019-12-02 DIAGNOSIS — I739 Peripheral vascular disease, unspecified: Secondary | ICD-10-CM | POA: Insufficient documentation

## 2019-12-02 DIAGNOSIS — K449 Diaphragmatic hernia without obstruction or gangrene: Secondary | ICD-10-CM | POA: Insufficient documentation

## 2019-12-02 DIAGNOSIS — E7800 Pure hypercholesterolemia, unspecified: Secondary | ICD-10-CM | POA: Diagnosis not present

## 2019-12-02 HISTORY — PX: ESOPHAGOGASTRODUODENOSCOPY (EGD) WITH PROPOFOL: SHX5813

## 2019-12-02 SURGERY — ESOPHAGOGASTRODUODENOSCOPY (EGD) WITH PROPOFOL
Anesthesia: General

## 2019-12-02 MED ORDER — GLYCOPYRROLATE 0.2 MG/ML IJ SOLN
INTRAMUSCULAR | Status: DC | PRN
  Administered 2019-12-02: .2 mg via INTRAVENOUS

## 2019-12-02 MED ORDER — SODIUM CHLORIDE 0.9 % IV SOLN
INTRAVENOUS | Status: DC
  Administered 2019-12-02: 20 mL/h via INTRAVENOUS

## 2019-12-02 MED ORDER — PROPOFOL 10 MG/ML IV BOLUS
INTRAVENOUS | Status: DC | PRN
  Administered 2019-12-02 (×4): 50 mg via INTRAVENOUS

## 2019-12-02 MED ORDER — LIDOCAINE HCL (CARDIAC) PF 100 MG/5ML IV SOSY
PREFILLED_SYRINGE | INTRAVENOUS | Status: DC | PRN
  Administered 2019-12-02: 50 mg via INTRAVENOUS

## 2019-12-02 MED ORDER — PROPOFOL 500 MG/50ML IV EMUL
INTRAVENOUS | Status: AC
  Filled 2019-12-02: qty 50

## 2019-12-02 NOTE — Op Note (Signed)
[facility] Gastroenterology Patient Name: Charles Stanton Procedure Date: 12/02/2019 7:24 AM MRN: [id number] Account #: [id number] Date of Birth: 1955/12/06 Admit Type: Outpatient Age: 64 Room: [facility] ENDO ROOM 3 Gender: Male Note Status: Finalized Procedure:             Upper GI endoscopy Indications:           Stricture of the esophagus Providers:             Andrey Farmer MD, MD Medicines:             Monitored Anesthesia Care Complications:         No immediate complications. Estimated blood loss:                         Minimal. Procedure:             Pre-Anesthesia Assessment:                        - Prior to the procedure, a History and Physical was                         performed, and patient medications and allergies were                         reviewed. The patient is competent. The risks and                         benefits of the procedure and the sedation options and                         risks were discussed with the patient. All questions                         were answered and informed consent was obtained.                         Patient identification and proposed procedure were                         verified by the physician, the nurse, the anesthetist                         and the technician in the endoscopy suite. Mental                         Status Examination: alert and oriented. Airway                         Examination: normal oropharyngeal airway and neck                         mobility. Respiratory Examination: clear to                         auscultation. CV Examination: normal. Prophylactic                         Antibiotics: The patient does not require prophylactic  antibiotics. Prior Anticoagulants: The patient has                         taken no previous anticoagulant or antiplatelet                         agents. ASA Grade Assessment: III - A patient with                         severe  systemic disease. After reviewing the risks and                         benefits, the patient was deemed in satisfactory                         condition to undergo the procedure. The anesthesia                         plan was to use monitored anesthesia care (MAC).                         Immediately prior to administration of medications,                         the patient was re-assessed for adequacy to receive                         sedatives. The heart rate, respiratory rate, oxygen                         saturations, blood pressure, adequacy of pulmonary                         ventilation, and response to care were monitored                         throughout the procedure. The physical status of the                         patient was re-assessed after the procedure.                        After obtaining informed consent, the endoscope was                         passed under direct vision. Throughout the procedure,                         the patient's blood pressure, pulse, and oxygen                         saturations were monitored continuously. The Endoscope                         was introduced through the mouth, and advanced to the                         antrum of the stomach. The upper GI endoscopy was  accomplished without difficulty. The patient tolerated                         the procedure well. Findings:      One benign-appearing, intrinsic severe (stenosis; an endoscope cannot       pass) stenosis was found. This stenosis measured 9 mm (inner diameter) x       1 cm (in length). The stenosis was traversed after dilation. A TTS       dilator was passed through the scope. Dilation with a 10-14-10 mm       balloon dilator was performed to 12 mm. The dilation site was examined       and showed moderate improvement in luminal narrowing. Estimated blood       loss was minimal.      A medium-sized hiatal hernia was present.      The exam of  the stomach was otherwise normal. Impression:            - Benign-appearing esophageal stenosis. Dilated.                        - Medium-sized hiatal hernia.                        - No specimens collected. Images were not able to be                         captured due to technical issues with the computer. Recommendation:        - Discharge patient to home.                        - Soft diet.                        - Continue present medications.                        - Repeat upper endoscopy in 1 month for retreatment.                        - Return to referring physician as previously                         scheduled. Procedure Code(s):     --- Professional ---                        [phone], 52, Esophagogastroduodenoscopy, flexible,                         transoral; with transendoscopic balloon dilation of                         esophagus (less than 30 mm diameter) Diagnosis Code(s):     --- Professional ---                        K22.2, Esophageal obstruction                        K44.9, Diaphragmatic hernia without obstruction or  gangrene CPT copyright 2019 American Medical Association. All rights reserved. The codes documented in this report are preliminary and upon coder review may  be revised to meet current compliance requirements. Andrey Farmer, MD Andrey Farmer MD, MD 12/02/2019 8:26:05 AM Number of Addenda: 0 Note Initiated On: 12/02/2019 7:24 AM Estimated Blood Loss:  Estimated blood loss was minimal.      [facility]

## 2019-12-02 NOTE — H&P (Signed)
Outpatient short stay form Pre-procedure 12/02/2019 7:48 AM Raylene Miyamoto MD, MPH  Primary Physician: Dr. Ginette Pitman  Reason for visit:  Esophageal Stricture  History of present illness:   64 y/o gentleman with history of high grade stricture that has been dilated up to 11 mm. Has history of parkinson's disease. No blood thinners. On PPI    Current Facility-Administered Medications:  .  0.9 %  sodium chloride infusion, , Intravenous, Continuous, Jennefer Kopp, Hilton Cork, MD, Last Rate: 20 mL/hr at 12/02/19 0732, Continued from Pre-op at 12/02/19 0732  Medications Prior to Admission  Medication Sig Dispense Refill Last Dose  . ascorbic acid (VITAMIN C) 500 MG tablet Take 1 tablet (500 mg total) by mouth daily. 30 tablet 0 12/01/2019 at Unknown time  . baclofen (LIORESAL) 10 MG tablet Take 10 mg by mouth daily.   0 12/01/2019 at Unknown time  . buPROPion (WELLBUTRIN XL) 150 MG 24 hr tablet Take 150 mg by mouth daily.   12/01/2019 at Unknown time  . carbidopa-levodopa (SINEMET CR) 50-200 MG tablet Take 1 tablet by mouth at bedtime.   12/02/2019 at 0600  . carbidopa-levodopa (SINEMET IR) 25-100 MG tablet TK 1 T PO four times a day  1 12/01/2019 at Unknown time  . Carbidopa-Levodopa ER (RYTARY) 23.75-95 MG CPCR Take 1 capsule by mouth daily.   12/01/2019 at Unknown time  . cholecalciferol (VITAMIN D) 25 MCG tablet Take 1 tablet (1,000 Units total) by mouth daily. 30 tablet 0 12/01/2019 at Unknown time  . folic acid (FOLVITE) 1 MG tablet Take 1 tablet (1 mg total) by mouth daily. 30 tablet 1 12/01/2019 at Unknown time  . gabapentin (NEURONTIN) 100 MG capsule Take 2 capsules (200 mg total) by mouth 2 (two) times daily. 60 capsule 0 12/01/2019 at Unknown time  . lisinopril (ZESTRIL) 5 MG tablet Take 1 tablet (5 mg total) by mouth at bedtime. 30 tablet 0 12/01/2019 at Unknown time  . Multiple Vitamin (MULTIVITAMIN WITH MINERALS) TABS tablet Take 1 tablet by mouth daily. 30 tablet 0 12/01/2019 at Unknown  time  . oxyCODONE (ROXICODONE) 5 MG immediate release tablet Take 1 tablet (5 mg total) by mouth every 8 (eight) hours as needed. 20 tablet 0 12/01/2019 at Unknown time  . oxyCODONE-acetaminophen (PERCOCET) 5-325 MG tablet Take 1 tablet by mouth every 4 (four) hours as needed for severe pain. 20 tablet 0 12/01/2019 at Unknown time  . pantoprazole (PROTONIX) 40 MG tablet Take 1 tablet (40 mg total) by mouth 2 (two) times daily. 60 tablet 1 12/01/2019 at Unknown time  . sertraline (ZOLOFT) 50 MG tablet Take 1 tablet (50 mg total) by mouth daily. 90 tablet 1 12/01/2019 at Unknown time  . thiamine 100 MG tablet Take 1 tablet (100 mg total) by mouth daily. 30 tablet 0 12/01/2019 at Unknown time  . thiamine 100 MG tablet Take 1 tablet (100 mg total) by mouth daily. 30 tablet 0 12/01/2019 at Unknown time  . traZODone (DESYREL) 100 MG tablet Take 100 mg by mouth at bedtime.   12/01/2019 at Unknown time  . vitamin B-12 (CYANOCOBALAMIN) 100 MCG tablet Take 100 mcg by mouth daily.   12/01/2019 at Unknown time  . zinc sulfate 220 (50 Zn) MG capsule Take 1 capsule (220 mg total) by mouth daily. 30 capsule 0 12/01/2019 at Unknown time  . amantadine (SYMMETREL) 100 MG capsule Take 50 mg by mouth daily. (Patient not taking: Reported on 10/28/2019)   Not Taking at Unknown time  Allergies  Allergen Reactions  . Amantadine Other (See Comments)    fatigue sleepy Other reaction(s): Other (See Comments) fatigue sleepy fatigue sleepy fatigue sleepy     Past Medical History:  Diagnosis Date  . Abnormal liver function   . Anemia   . Anxiety   . Atrial fibrillation ([place])   . Cancer ([place]) 2017   skin  . Colonic mass   . Depression   . Dizziness   . Dry cough   . History of colon polyps   . Hypercholesteremia   . Hyperlipemia   . Hypertension   . Palpitations   . Parkinson's disease ([place])   . Parkinson's disease ([place])   . PVD (peripheral vascular disease) ([place])   . Tonsillitis 03/25/2013  .  Tremors of nervous system     Review of systems:  Otherwise negative.    Physical Exam  Gen: Alert, oriented. Appears stated age.  HEENT:  PERRLA. Lungs: No respiratory distress CV: RRR Abd: soft, benign, no masses Ext: No edema.     Planned procedures: Proceed with colonoscopy. The patient understands the nature of the planned procedure, indications, risks, alternatives and potential complications including but not limited to bleeding, infection, perforation, damage to internal organs and possible oversedation/side effects from anesthesia. The patient agrees and gives consent to proceed.  Please refer to procedure notes for findings, recommendations and patient disposition/instructions.     Raylene Miyamoto MD, MPH Gastroenterology 12/02/2019  7:48 AM

## 2019-12-02 NOTE — Transfer of Care (Signed)
Immediate Anesthesia Transfer of Care Note  Patient: Charles Stanton  Procedure(s) Performed: ESOPHAGOGASTRODUODENOSCOPY (EGD) WITH PROPOFOL (N/A )  Patient Location: PACU and Endoscopy Unit  Anesthesia Type:General  Level of Consciousness: drowsy  Airway & Oxygen Therapy: Patient Spontanous Breathing and Patient connected to nasal cannula oxygen  Post-op Assessment: Report given to RN  Post vital signs: stable  Last Vitals:  Vitals Value Taken Time  BP    Temp    Pulse    Resp    SpO2      Last Pain:  Vitals:   12/02/19 0710  TempSrc: Temporal  PainSc: 0-No pain         Complications: No complications documented.

## 2019-12-02 NOTE — Anesthesia Postprocedure Evaluation (Signed)
Anesthesia Post Note  Patient: Charles Stanton  Procedure(s) Performed: ESOPHAGOGASTRODUODENOSCOPY (EGD) WITH PROPOFOL (N/A )  Patient location during evaluation: Endoscopy Anesthesia Type: General Level of consciousness: awake and alert Pain management: pain level controlled Vital Signs Assessment: post-procedure vital signs reviewed and stable Respiratory status: spontaneous breathing and respiratory function stable Cardiovascular status: stable Anesthetic complications: no   No complications documented.   Last Vitals:  Vitals:   12/02/19 0819 12/02/19 0829  BP: (!) 92/58 115/71  Pulse: (!) 50 (!) 44  Resp: 19 18  Temp:    SpO2: 96% 99%    Last Pain:  Vitals:   12/02/19 0839  TempSrc:   PainSc: 0-No pain                 Raphel Stickles K

## 2019-12-02 NOTE — Anesthesia Preprocedure Evaluation (Signed)
Anesthesia Evaluation  Patient identified by MRN, date of birth, ID band Patient awake    Reviewed: Allergy & Precautions, NPO status , Patient's Chart, lab work & pertinent test results  History of Anesthesia Complications Negative for: history of anesthetic complications  Airway Mallampati: III       Dental  (+) Upper Dentures, Lower Dentures   Pulmonary neg sleep apnea, neg COPD, Not current smoker,           Cardiovascular (-) hypertension(-) Past MI and (-) CHF (-) dysrhythmias (-) Valvular Problems/Murmurs     Neuro/Psych neg Seizures Anxiety Depression Parkinson's dz    GI/Hepatic Neg liver ROS, PUD, GERD  Medicated,  Endo/Other  neg diabetes  Renal/GU negative Renal ROS     Musculoskeletal   Abdominal   Peds  Hematology  (+) anemia ,   Anesthesia Other Findings   Reproductive/Obstetrics                             Anesthesia Physical Anesthesia Plan  ASA: III  Anesthesia Plan: General   Post-op Pain Management:    Induction: Intravenous  PONV Risk Score and Plan: 2 and Propofol infusion and TIVA  Airway Management Planned: Nasal Cannula  Additional Equipment:   Intra-op Plan:   Post-operative Plan:   Informed Consent: I have reviewed the patients History and Physical, chart, labs and discussed the procedure including the risks, benefits and alternatives for the proposed anesthesia with the patient or authorized representative who has indicated his/her understanding and acceptance.       Plan Discussed with:   Anesthesia Plan Comments:         Anesthesia Quick Evaluation

## 2019-12-03 ENCOUNTER — Encounter: Payer: Self-pay | Admitting: Gastroenterology

## 2020-01-17 ENCOUNTER — Other Ambulatory Visit: Admission: RE | Admit: 2020-01-17 | Payer: Medicare Other | Source: Ambulatory Visit

## 2020-02-14 ENCOUNTER — Other Ambulatory Visit
Admission: RE | Admit: 2020-02-14 | Discharge: 2020-02-14 | Disposition: A | Discharge status: Home / Self Care | Payer: Medicare Other | Source: Ambulatory Visit | Attending: Gastroenterology | Admitting: Gastroenterology

## 2020-02-14 ENCOUNTER — Other Ambulatory Visit: Payer: Self-pay

## 2020-02-14 DIAGNOSIS — Z20822 Contact with and (suspected) exposure to covid-19: Secondary | ICD-10-CM | POA: Diagnosis not present

## 2020-02-14 DIAGNOSIS — Z01812 Encounter for preprocedural laboratory examination: Secondary | ICD-10-CM | POA: Insufficient documentation

## 2020-02-14 LAB — SARS CORONAVIRUS 2 (TAT 6-24 HRS): SARS Coronavirus 2: NEGATIVE

## 2020-02-17 ENCOUNTER — Encounter: Payer: Self-pay | Admitting: *Deleted

## 2020-02-18 ENCOUNTER — Encounter
Admission: RE | Disposition: A | Discharge status: Home / Self Care | Payer: Self-pay | Source: Home / Self Care | Attending: Gastroenterology

## 2020-02-18 ENCOUNTER — Ambulatory Visit
Admission: RE | Admit: 2020-02-18 | Discharge: 2020-02-18 | Disposition: A | Discharge status: Home / Self Care | Payer: Medicare Other | Attending: Gastroenterology | Admitting: Gastroenterology

## 2020-02-18 ENCOUNTER — Ambulatory Visit: Payer: Medicare Other | Admitting: Anesthesiology

## 2020-02-18 ENCOUNTER — Other Ambulatory Visit: Payer: Self-pay

## 2020-02-18 ENCOUNTER — Encounter: Payer: Self-pay | Admitting: *Deleted

## 2020-02-18 DIAGNOSIS — G20 Parkinson's disease: Secondary | ICD-10-CM | POA: Diagnosis not present

## 2020-02-18 DIAGNOSIS — Z888 Allergy status to other drugs, medicaments and biological substances status: Secondary | ICD-10-CM | POA: Diagnosis not present

## 2020-02-18 DIAGNOSIS — R251 Tremor, unspecified: Secondary | ICD-10-CM | POA: Insufficient documentation

## 2020-02-18 DIAGNOSIS — I10 Essential (primary) hypertension: Secondary | ICD-10-CM | POA: Insufficient documentation

## 2020-02-18 DIAGNOSIS — I4891 Unspecified atrial fibrillation: Secondary | ICD-10-CM | POA: Diagnosis not present

## 2020-02-18 DIAGNOSIS — Z85828 Personal history of other malignant neoplasm of skin: Secondary | ICD-10-CM | POA: Diagnosis not present

## 2020-02-18 DIAGNOSIS — K222 Esophageal obstruction: Secondary | ICD-10-CM | POA: Diagnosis present

## 2020-02-18 DIAGNOSIS — I739 Peripheral vascular disease, unspecified: Secondary | ICD-10-CM | POA: Insufficient documentation

## 2020-02-18 DIAGNOSIS — E7800 Pure hypercholesterolemia, unspecified: Secondary | ICD-10-CM | POA: Diagnosis not present

## 2020-02-18 DIAGNOSIS — Z79899 Other long term (current) drug therapy: Secondary | ICD-10-CM | POA: Diagnosis not present

## 2020-02-18 DIAGNOSIS — K449 Diaphragmatic hernia without obstruction or gangrene: Secondary | ICD-10-CM | POA: Diagnosis not present

## 2020-02-18 DIAGNOSIS — Z86010 Personal history of colonic polyps: Secondary | ICD-10-CM | POA: Insufficient documentation

## 2020-02-18 DIAGNOSIS — E785 Hyperlipidemia, unspecified: Secondary | ICD-10-CM | POA: Diagnosis not present

## 2020-02-18 HISTORY — PX: ESOPHAGOGASTRODUODENOSCOPY (EGD) WITH PROPOFOL: SHX5813

## 2020-02-18 SURGERY — ESOPHAGOGASTRODUODENOSCOPY (EGD) WITH PROPOFOL
Anesthesia: General

## 2020-02-18 MED ORDER — GLYCOPYRROLATE 0.2 MG/ML IJ SOLN
INTRAMUSCULAR | Status: DC | PRN
  Administered 2020-02-18: .2 mg via INTRAVENOUS

## 2020-02-18 MED ORDER — LIDOCAINE HCL (CARDIAC) PF 100 MG/5ML IV SOSY
PREFILLED_SYRINGE | INTRAVENOUS | Status: DC | PRN
  Administered 2020-02-18: 100 mg via INTRAVENOUS

## 2020-02-18 MED ORDER — SODIUM CHLORIDE 0.9 % IV SOLN: INTRAVENOUS | Status: DC

## 2020-02-18 MED ORDER — PROPOFOL 10 MG/ML IV BOLUS
INTRAVENOUS | Status: DC | PRN
  Administered 2020-02-18 (×2): 20 mg via INTRAVENOUS
  Administered 2020-02-18: 50 mg via INTRAVENOUS
  Administered 2020-02-18: 10 mg via INTRAVENOUS

## 2020-02-18 MED ORDER — PROPOFOL 500 MG/50ML IV EMUL
INTRAVENOUS | Status: DC | PRN
  Administered 2020-02-18: 155 ug/kg/min via INTRAVENOUS

## 2020-02-18 NOTE — H&P (Signed)
Outpatient short stay form Pre-procedure 02/18/2020 8:18 AM Charles Miyamoto MD, MPH  Primary Physician: Dr. Ginette Pitman  Reason for visit:  Esophageal Stricture  History of present illness:   65 y/o gentleman with history of Parkinson's and tight stricture requiring serial dilations here for repeat dilation. No blood thinners. No recent episodes of dysphagia.    Current Facility-Administered Medications:  .  0.9 %  sodium chloride infusion, , Intravenous, Continuous, Shinichi Anguiano, Hilton Cork, MD, Last Rate: 20 mL/hr at 02/18/20 0808, New Bag at 02/18/20 0808  Medications Prior to Admission  Medication Sig Dispense Refill Last Dose  . ascorbic acid (VITAMIN C) 500 MG tablet Take 1 tablet (500 mg total) by mouth daily. 30 tablet 0 02/17/2020 at Unknown time  . baclofen (LIORESAL) 10 MG tablet Take 10 mg by mouth daily.   0 02/17/2020 at Unknown time  . buPROPion (WELLBUTRIN XL) 150 MG 24 hr tablet Take 150 mg by mouth daily.   02/17/2020 at Unknown time  . carbidopa-levodopa (SINEMET CR) 50-200 MG tablet Take 1 tablet by mouth at bedtime.   02/17/2020 at Unknown time  . carbidopa-levodopa (SINEMET IR) 25-100 MG tablet TK 1 T PO four times a day  1 02/18/2020 at 0600  . cholecalciferol (VITAMIN D) 25 MCG tablet Take 1 tablet (1,000 Units total) by mouth daily. 30 tablet 0 02/17/2020 at Unknown time  . folic acid (FOLVITE) 1 MG tablet Take 1 tablet (1 mg total) by mouth daily. 30 tablet 1 02/17/2020 at Unknown time  . gabapentin (NEURONTIN) 100 MG capsule Take 2 capsules (200 mg total) by mouth 2 (two) times daily. 60 capsule 0 02/17/2020 at Unknown time  . lisinopril (ZESTRIL) 5 MG tablet Take 1 tablet (5 mg total) by mouth at bedtime. 30 tablet 0 02/17/2020 at Unknown time  . Multiple Vitamin (MULTIVITAMIN WITH MINERALS) TABS tablet Take 1 tablet by mouth daily. 30 tablet 0 02/17/2020 at Unknown time  . oxyCODONE (ROXICODONE) 5 MG immediate release tablet Take 1 tablet (5 mg total) by mouth every 8 (eight) hours  as needed. 20 tablet 0 Past Month at Unknown time  . oxyCODONE-acetaminophen (PERCOCET) 5-325 MG tablet Take 1 tablet by mouth every 4 (four) hours as needed for severe pain. 20 tablet 0 Past Month at Unknown time  . pantoprazole (PROTONIX) 40 MG tablet Take 1 tablet (40 mg total) by mouth 2 (two) times daily. 60 tablet 1 02/17/2020 at Unknown time  . sertraline (ZOLOFT) 50 MG tablet Take 1 tablet (50 mg total) by mouth daily. 90 tablet 1 02/17/2020 at Unknown time  . thiamine 100 MG tablet Take 1 tablet (100 mg total) by mouth daily. 30 tablet 0 02/17/2020 at Unknown time  . thiamine 100 MG tablet Take 1 tablet (100 mg total) by mouth daily. 30 tablet 0 02/17/2020 at Unknown time  . traZODone (DESYREL) 100 MG tablet Take 100 mg by mouth at bedtime.   02/17/2020 at Unknown time  . vitamin B-12 (CYANOCOBALAMIN) 100 MCG tablet Take 100 mcg by mouth daily.   02/17/2020 at Unknown time  . zinc sulfate 220 (50 Zn) MG capsule Take 1 capsule (220 mg total) by mouth daily. 30 capsule 0 02/17/2020 at Unknown time  . amantadine (SYMMETREL) 100 MG capsule Take 50 mg by mouth daily. (Patient not taking: No sig reported)   Not Taking at Unknown time  . Carbidopa-Levodopa ER (RYTARY) 23.75-95 MG CPCR Take 1 capsule by mouth daily. (Patient not taking: Reported on 02/18/2020)   Not Taking  at Unknown time     Allergies  Allergen Reactions  . Amantadine Other (See Comments)    fatigue sleepy Other reaction(s): Other (See Comments) fatigue sleepy fatigue sleepy fatigue sleepy     Past Medical History:  Diagnosis Date  . Abnormal liver function   . Anemia   . Anxiety   . Atrial fibrillation ([place])   . Cancer ([place]) 2017   skin  . Colonic mass   . Depression   . Dizziness   . Dry cough   . History of colon polyps   . Hypercholesteremia   . Hyperlipemia   . Hypertension   . Palpitations   . Parkinson's disease ([place])   . Parkinson's disease ([place])   . PVD (peripheral vascular disease) ([place])   .  Tonsillitis 03/25/2013  . Tremors of nervous system     Review of systems:  Otherwise negative.    Physical Exam  Gen: Alert, oriented. Appears stated age.  HEENT: PERRLA. Lungs: No respiratory distress CV: RRR Abd: soft, benign, no masses Ext: No edema    Planned procedures: Proceed with EGD. The patient understands the nature of the planned procedure, indications, risks, alternatives and potential complications including but not limited to bleeding, infection, perforation, damage to internal organs and possible oversedation/side effects from anesthesia. The patient agrees and gives consent to proceed.  Please refer to procedure notes for findings, recommendations and patient disposition/instructions.     Charles Miyamoto MD, MPH Gastroenterology 02/18/2020  8:18 AM

## 2020-02-18 NOTE — Anesthesia Preprocedure Evaluation (Addendum)
Anesthesia Evaluation  Patient identified by MRN, date of birth, ID band Patient awake    Reviewed: Allergy & Precautions, H&P , NPO status , Patient's Chart, lab work & pertinent test results  History of Anesthesia Complications Negative for: history of anesthetic complications  Airway Mallampati: II  TM Distance: >3 FB    Comment: beard Dental  (+) Missing Multiple missing teeth:   Pulmonary neg pulmonary ROS, neg sleep apnea, neg COPD,    breath sounds clear to auscultation       Cardiovascular hypertension, (-) angina+ Peripheral Vascular Disease  (-) Past MI and (-) Cardiac Stents + dysrhythmias Atrial Fibrillation  Rhythm:regular Rate:Normal     Neuro/Psych PSYCHIATRIC DISORDERS Anxiety Depression  Neuromuscular disease (Parkinson's disease)    GI/Hepatic Neg liver ROS, PUD, GERD  ,(+)     substance abuse  alcohol use,   Endo/Other  diabetes  Renal/GU negative Renal ROS  negative genitourinary   Musculoskeletal   Abdominal   Peds  Hematology  (+) Blood dyscrasia, anemia ,   Anesthesia Other Findings Past Medical History: No date: Abnormal liver function No date: Anemia No date: Anxiety No date: Atrial fibrillation ([place]) 2017: Cancer ([place])     Comment:  skin No date: Colonic mass No date: Depression No date: Dizziness No date: Dry cough No date: History of colon polyps No date: Hypercholesteremia No date: Hyperlipemia No date: Hypertension No date: Palpitations No date: Parkinson's disease ([place]) No date: Parkinson's disease ([place]) No date: PVD (peripheral vascular disease) ([place]) 03/25/2013: Tonsillitis No date: Tremors of nervous system  Past Surgical History: No date: BACK SURGERY     Comment:  Kyphoplasty   March 2018 No date: COLONOSCOPY 12/12/2017: COLONOSCOPY WITH PROPOFOL; N/A     Comment:  Procedure: COLONOSCOPY WITH PROPOFOL;  Surgeon: Jonathon Bellows, MD;  Location: [facility]  ENDOSCOPY;  Service:               Gastroenterology;  Laterality: N/A; 12/11/2017: ESOPHAGOGASTRODUODENOSCOPY; N/A     Comment:  Procedure: ESOPHAGOGASTRODUODENOSCOPY (EGD);  Surgeon:               Jonathon Bellows, MD;  Location: [facility] ENDOSCOPY;  Service:               Gastroenterology;  Laterality: N/A; 09/21/2016: ESOPHAGOGASTRODUODENOSCOPY (EGD) WITH PROPOFOL; N/A     Comment:  Procedure: ESOPHAGOGASTRODUODENOSCOPY (EGD) WITH               PROPOFOL;  Surgeon: Jonathon Bellows, MD;  Location: [facility]               ENDOSCOPY;  Service: Gastroenterology;  Laterality: N/A; 11/07/2016: ESOPHAGOGASTRODUODENOSCOPY (EGD) WITH PROPOFOL; N/A     Comment:  Procedure: ESOPHAGOGASTRODUODENOSCOPY (EGD) WITH               PROPOFOL;  Surgeon: Jonathon Bellows, MD;  Location: [facility]               ENDOSCOPY;  Service: Gastroenterology;  Laterality: N/A; 10/15/2019: ESOPHAGOGASTRODUODENOSCOPY (EGD) WITH PROPOFOL; N/A     Comment:  Procedure: ESOPHAGOGASTRODUODENOSCOPY (EGD) WITH               PROPOFOL;  Surgeon: [name], MD;  Location:               [facility] ENDOSCOPY;  Service: Endoscopy;  Laterality: N/A; 10/28/2019: ESOPHAGOGASTRODUODENOSCOPY (EGD) WITH PROPOFOL; N/A     Comment:  Procedure: ESOPHAGOGASTRODUODENOSCOPY (EGD)  WITH               PROPOFOL;  Surgeon: [name], MD;  Location:               [facility] ENDOSCOPY;  Service: Endoscopy;  Laterality: N/A; 12/02/2019: ESOPHAGOGASTRODUODENOSCOPY (EGD) WITH PROPOFOL; N/A     Comment:  Procedure: ESOPHAGOGASTRODUODENOSCOPY (EGD) WITH               PROPOFOL;  Surgeon: [name], MD;  Location:               [facility] ENDOSCOPY;  Service: Endoscopy;  Laterality: N/A; 05/28/2019: FRACTURE SURGERY; Right     Comment:  distal radius fracture No date: HERNIA REPAIR     Comment:  left inguinal hernia repair as an infant 02/11/2016: KYPHOPLASTY; N/A     Comment:  Procedure: KYPHOPLASTY  L2,T9;  Surgeon: Hessie Knows,               MD;  Location: [facility] ORS;   Service: Orthopedics;                Laterality: N/A; 03/15/2016: KYPHOPLASTY; N/A     Comment:  Procedure: KYPHOPLASTY L3;  Surgeon: Hessie Knows, MD;                Location: [facility] ORS;  Service: Orthopedics;  Laterality:               N/A; 05/28/2019: OPEN REDUCTION INTERNAL FIXATION (ORIF) DISTAL RADIAL  FRACTURE; Right     Comment:  Procedure: OPEN REDUCTION INTERNAL FIXATION (ORIF)               DISTAL RADIAL FRACTURE;  Surgeon: Hessie Knows, MD;                Location: [facility] ORS;  Service: Orthopedics;  Laterality:               Right; No date: skin cancer removed No date: TONSILLECTOMY     Reproductive/Obstetrics negative OB ROS                            Anesthesia Physical Anesthesia Plan  ASA: III  Anesthesia Plan: General   Post-op Pain Management:    Induction:   PONV Risk Score and Plan: Propofol infusion and TIVA  Airway Management Planned: Nasal Cannula  Additional Equipment:   Intra-op Plan:   Post-operative Plan:   Informed Consent: I have reviewed the patients History and Physical, chart, labs and discussed the procedure including the risks, benefits and alternatives for the proposed anesthesia with the patient or authorized representative who has indicated his/her understanding and acceptance.     Dental Advisory Given  Plan Discussed with: Anesthesiologist, CRNA and Surgeon  Anesthesia Plan Comments:         Anesthesia Quick Evaluation

## 2020-02-18 NOTE — Interval H&P Note (Signed)
History and Physical Interval Note:  02/18/2020 8:20 AM  Charles Stanton  has presented today for surgery, with the diagnosis of DYSPHAGIA.  The various methods of treatment have been discussed with the patient and family. After consideration of risks, benefits and other options for treatment, the patient has consented to  Procedure(s): ESOPHAGOGASTRODUODENOSCOPY (EGD) WITH PROPOFOL (N/A) as a surgical intervention.  The patient's history has been reviewed, patient examined, no change in status, stable for surgery.  I have reviewed the patient's chart and labs.  Questions were answered to the patient's satisfaction.     [name]  Ok to proceed with EGD

## 2020-02-18 NOTE — Anesthesia Postprocedure Evaluation (Signed)
Anesthesia Post Note  Patient: Charles Stanton  Procedure(s) Performed: ESOPHAGOGASTRODUODENOSCOPY (EGD) WITH PROPOFOL (N/A )  Patient location during evaluation: PACU Anesthesia Type: General Level of consciousness: awake and alert Pain management: pain level controlled Vital Signs Assessment: post-procedure vital signs reviewed and stable Respiratory status: spontaneous breathing, nonlabored ventilation and respiratory function stable Cardiovascular status: blood pressure returned to baseline and stable Postop Assessment: no apparent nausea or vomiting Anesthetic complications: no   No complications documented.   Last Vitals:  Vitals:   02/18/20 0850 02/18/20 0900  BP: 133/81 (!) 144/81  Pulse: (!) 40 67  Resp: (!) 24 (!) 21  Temp:    SpO2: 96% 95%    Last Pain:  Vitals:   02/18/20 0900  TempSrc:   PainSc: 0-No pain                 Brett Canales [place]ell

## 2020-02-18 NOTE — Op Note (Signed)
[facility] Patient Name: Charles Stanton Procedure Date: 02/18/2020 8:20 AM MRN: [id number] Account #: [id number] Date of Birth: 1955/03/11 Admit Type: Outpatient Age: 65 Room: [facility] ENDO ROOM 1 Gender: Male Note Status: Finalized Procedure:             Upper GI endoscopy Indications:           Stricture of the esophagus Providers:             Andrey Farmer MD, MD Referring MD:          [name], MD (Referring MD) Medicines:             Monitored Anesthesia Care Complications:         No immediate complications. Estimated blood loss:                         Minimal. Procedure:             Pre-Anesthesia Assessment:                        - Prior to the procedure, a History and Physical was                         performed, and patient medications and allergies were                         reviewed. The patient is competent. The risks and                         benefits of the procedure and the sedation options and                         risks were discussed with the patient. All questions                         were answered and informed consent was obtained.                         Patient identification and proposed procedure were                         verified by the physician, the nurse, the anesthetist                         and the technician in the endoscopy suite. Mental                         Status Examination: alert and oriented. Airway                         Examination: normal oropharyngeal airway and neck                         mobility. Respiratory Examination: clear to                         auscultation. CV Examination: normal. Prophylactic  Antibiotics: The patient does not require prophylactic                         antibiotics. Prior Anticoagulants: The patient has                         taken no previous anticoagulant or antiplatelet                         agents. ASA Grade  Assessment: III - A patient with                         severe systemic disease. After reviewing the risks and                         benefits, the patient was deemed in satisfactory                         condition to undergo the procedure. The anesthesia                         plan was to use monitored anesthesia care (MAC).                         Immediately prior to administration of medications,                         the patient was re-assessed for adequacy to receive                         sedatives. The heart rate, respiratory rate, oxygen                         saturations, blood pressure, adequacy of pulmonary                         ventilation, and response to care were monitored                         throughout the procedure. The physical status of the                         patient was re-assessed after the procedure.                        After obtaining informed consent, the endoscope was                         passed under direct vision. Throughout the procedure,                         the patient's blood pressure, pulse, and oxygen                         saturations were monitored continuously. The Endoscope                         was introduced through the mouth, and advanced to the  second part of duodenum. The upper GI endoscopy was                         accomplished without difficulty. The patient tolerated                         the procedure well. Findings:      One benign-appearing, intrinsic moderate (circumferential scarring or       stenosis; an endoscope may pass) stenosis was found. This stenosis       measured 1 cm (inner diameter) x 1 cm (in length). The stenosis was       traversed after dilation. A TTS dilator was passed through the scope.       Dilation with a 12-13.5-15 mm balloon dilator was performed to 15 mm.       The dilation site was examined and showed moderate mucosal disruption.       Estimated blood  loss was minimal.      A 6 cm hiatal hernia was present.      The entire examined stomach was normal.      The examined duodenum was normal. Impression:            - Benign-appearing esophageal stenosis. Dilated.                        - Normal stomach.                        - Normal examined duodenum.                        - No specimens collected. Recommendation:        - Discharge patient to home.                        - Resume previous diet.                        - Continue present medications.                        - Return to referring physician at appointment to be                         scheduled. Will see patient in clinic and discuss next                         steps. Would like to repeat endoscopy in a few months                         to assess stricture and get Barrett's biopsies. Procedure Code(s):     --- Professional ---                        [id number], Esophagogastroduodenoscopy, flexible,                         transoral; with transendoscopic balloon dilation of                         esophagus (less than 30 mm diameter) Diagnosis  Code(s):     --- Professional ---                        K22.2, Esophageal obstruction CPT copyright 2019 American Medical Association. All rights reserved. The codes documented in this report are preliminary and upon coder review may  be revised to meet current compliance requirements. Andrey Farmer MD, MD 02/18/2020 8:44:03 AM Number of Addenda: 0 Note Initiated On: 02/18/2020 8:20 AM Estimated Blood Loss:  Estimated blood loss was minimal.      [facility]

## 2020-02-18 NOTE — Anesthesia Procedure Notes (Signed)
Procedure Name: General with mask airway Performed by: Fletcher-Harrison, Edelyn Heidel, CRNA Pre-anesthesia Checklist: Patient identified, Emergency Drugs available, Suction available and Patient being monitored Patient Re-evaluated:Patient Re-evaluated prior to induction Oxygen Delivery Method: Simple face mask Induction Type: IV induction Placement Confirmation: positive ETCO2 and CO2 detector Dental Injury: Teeth and Oropharynx as per pre-operative assessment        

## 2020-02-18 NOTE — Transfer of Care (Signed)
Immediate Anesthesia Transfer of Care Note  Patient: Charles Stanton  Procedure(s) Performed: ESOPHAGOGASTRODUODENOSCOPY (EGD) WITH PROPOFOL (N/A )  Patient Location: Endoscopy Unit  Anesthesia Type:General  Level of Consciousness: drowsy and patient cooperative  Airway & Oxygen Therapy: Patient Spontanous Breathing and Patient connected to face mask oxygen  Post-op Assessment: Report given to RN and Post -op Vital signs reviewed and stable  Post vital signs: Reviewed and stable  Last Vitals:  Vitals Value Taken Time  BP 115/86 02/18/20 0841  Temp 36.3 C 02/18/20 0840  Pulse 80 02/18/20 0844  Resp 20 02/18/20 0844  SpO2 100 % 02/18/20 0844  Vitals shown include unvalidated device data.  Last Pain:  Vitals:   02/18/20 0840  TempSrc: Temporal  PainSc: Asleep         Complications: No complications documented.

## 2021-07-30 ENCOUNTER — Encounter: Payer: Self-pay | Admitting: *Deleted

## 2021-08-02 ENCOUNTER — Ambulatory Visit: Payer: Medicare Other | Admitting: Anesthesiology

## 2021-08-02 ENCOUNTER — Ambulatory Visit
Admission: RE | Admit: 2021-08-02 | Discharge: 2021-08-02 | Disposition: A | Discharge status: Home / Self Care | Payer: Medicare Other | Attending: Gastroenterology | Admitting: Gastroenterology

## 2021-08-02 ENCOUNTER — Encounter: Payer: Self-pay | Admitting: *Deleted

## 2021-08-02 ENCOUNTER — Encounter
Admission: RE | Disposition: A | Discharge status: Home / Self Care | Payer: Self-pay | Source: Home / Self Care | Attending: Gastroenterology

## 2021-08-02 DIAGNOSIS — K222 Esophageal obstruction: Secondary | ICD-10-CM | POA: Insufficient documentation

## 2021-08-02 DIAGNOSIS — F419 Anxiety disorder, unspecified: Secondary | ICD-10-CM | POA: Diagnosis not present

## 2021-08-02 DIAGNOSIS — K921 Melena: Secondary | ICD-10-CM | POA: Insufficient documentation

## 2021-08-02 DIAGNOSIS — I739 Peripheral vascular disease, unspecified: Secondary | ICD-10-CM | POA: Insufficient documentation

## 2021-08-02 DIAGNOSIS — Z85828 Personal history of other malignant neoplasm of skin: Secondary | ICD-10-CM | POA: Insufficient documentation

## 2021-08-02 DIAGNOSIS — K319 Disease of stomach and duodenum, unspecified: Secondary | ICD-10-CM | POA: Insufficient documentation

## 2021-08-02 DIAGNOSIS — K449 Diaphragmatic hernia without obstruction or gangrene: Secondary | ICD-10-CM | POA: Insufficient documentation

## 2021-08-02 DIAGNOSIS — F32A Depression, unspecified: Secondary | ICD-10-CM | POA: Insufficient documentation

## 2021-08-02 DIAGNOSIS — G20 Parkinson's disease: Secondary | ICD-10-CM | POA: Diagnosis not present

## 2021-08-02 DIAGNOSIS — Z8711 Personal history of peptic ulcer disease: Secondary | ICD-10-CM | POA: Diagnosis not present

## 2021-08-02 DIAGNOSIS — I10 Essential (primary) hypertension: Secondary | ICD-10-CM | POA: Diagnosis not present

## 2021-08-02 DIAGNOSIS — K2100 Gastro-esophageal reflux disease with esophagitis, without bleeding: Secondary | ICD-10-CM | POA: Insufficient documentation

## 2021-08-02 HISTORY — PX: ESOPHAGOGASTRODUODENOSCOPY (EGD) WITH PROPOFOL: SHX5813

## 2021-08-02 SURGERY — ESOPHAGOGASTRODUODENOSCOPY (EGD) WITH PROPOFOL
Anesthesia: General

## 2021-08-02 MED ORDER — PROPOFOL 1000 MG/100ML IV EMUL
INTRAVENOUS | Status: AC
  Filled 2021-08-02: qty 100

## 2021-08-02 MED ORDER — LIDOCAINE HCL (CARDIAC) PF 100 MG/5ML IV SOSY
PREFILLED_SYRINGE | INTRAVENOUS | Status: DC | PRN
  Administered 2021-08-02: 50 mg via INTRAVENOUS

## 2021-08-02 MED ORDER — LIDOCAINE HCL (PF) 2 % IJ SOLN
INTRAMUSCULAR | Status: AC
  Filled 2021-08-02: qty 5

## 2021-08-02 MED ORDER — PROPOFOL 500 MG/50ML IV EMUL
INTRAVENOUS | Status: DC | PRN
  Administered 2021-08-02: 120 ug/kg/min via INTRAVENOUS

## 2021-08-02 MED ORDER — SODIUM CHLORIDE 0.9 % IV SOLN: INTRAVENOUS | Status: DC

## 2021-08-02 NOTE — Transfer of Care (Signed)
Immediate Anesthesia Transfer of Care Note  Patient: Charles Stanton  Procedure(s) Performed: ESOPHAGOGASTRODUODENOSCOPY (EGD) WITH PROPOFOL  Patient Location: PACU  Anesthesia Type:General  Level of Consciousness: awake and sedated  Airway & Oxygen Therapy: Patient Spontanous Breathing and Patient connected to nasal cannula oxygen  Post-op Assessment: Report given to RN and Post -op Vital signs reviewed and stable  Post vital signs: Reviewed and stable  Last Vitals:  Vitals Value Taken Time  BP    Temp    Pulse    Resp    SpO2      Last Pain:  Vitals:   08/02/21 1143  TempSrc: Temporal         Complications: No notable events documented.

## 2021-08-02 NOTE — Anesthesia Preprocedure Evaluation (Signed)
Anesthesia Evaluation  Patient identified by MRN, date of birth, ID band Patient awake    Reviewed: Allergy & Precautions, NPO status , Patient's Chart, lab work & pertinent test results  History of Anesthesia Complications Negative for: history of anesthetic complications  Airway Mallampati: III  TM Distance: <3 FB Neck ROM: full    Dental  (+) Chipped, Poor Dentition, Missing   Pulmonary neg pulmonary ROS, neg shortness of breath,    Pulmonary exam normal        Cardiovascular Exercise Tolerance: Good hypertension, (-) anginaNormal cardiovascular exam     Neuro/Psych negative neurological ROS  negative psych ROS   GI/Hepatic Neg liver ROS, PUD, GERD  Controlled,  Endo/Other  diabetes, Type 2  Renal/GU negative Renal ROS  negative genitourinary   Musculoskeletal   Abdominal   Peds  Hematology negative hematology ROS (+)   Anesthesia Other Findings Past Medical History: No date: Abnormal liver function No date: Anemia No date: Anxiety No date: Atrial fibrillation ([place]) 2017: Cancer ([place])     Comment:  skin No date: Colonic mass No date: Depression No date: Dizziness No date: Dry cough No date: History of colon polyps No date: Hypercholesteremia No date: Hyperlipemia No date: Hypertension No date: Palpitations No date: Parkinson's disease ([place]) No date: Parkinson's disease ([place]) No date: PVD (peripheral vascular disease) ([place]) 03/25/2013: Tonsillitis No date: Tremors of nervous system  Past Surgical History: No date: BACK SURGERY     Comment:  Kyphoplasty   March 2018 No date: COLONOSCOPY 12/12/2017: COLONOSCOPY WITH PROPOFOL; N/A     Comment:  Procedure: COLONOSCOPY WITH PROPOFOL;  Surgeon: Jonathon Bellows, MD;  Location: [facility] ENDOSCOPY;  Service:               Gastroenterology;  Laterality: N/A; 12/11/2017: ESOPHAGOGASTRODUODENOSCOPY; N/A     Comment:  Procedure:  ESOPHAGOGASTRODUODENOSCOPY (EGD);  Surgeon:               Jonathon Bellows, MD;  Location: [facility] ENDOSCOPY;  Service:               Gastroenterology;  Laterality: N/A; 09/21/2016: ESOPHAGOGASTRODUODENOSCOPY (EGD) WITH PROPOFOL; N/A     Comment:  Procedure: ESOPHAGOGASTRODUODENOSCOPY (EGD) WITH               PROPOFOL;  Surgeon: Jonathon Bellows, MD;  Location: [facility]               ENDOSCOPY;  Service: Gastroenterology;  Laterality: N/A; 11/07/2016: ESOPHAGOGASTRODUODENOSCOPY (EGD) WITH PROPOFOL; N/A     Comment:  Procedure: ESOPHAGOGASTRODUODENOSCOPY (EGD) WITH               PROPOFOL;  Surgeon: Jonathon Bellows, MD;  Location: [facility]               ENDOSCOPY;  Service: Gastroenterology;  Laterality: N/A; 10/15/2019: ESOPHAGOGASTRODUODENOSCOPY (EGD) WITH PROPOFOL; N/A     Comment:  Procedure: ESOPHAGOGASTRODUODENOSCOPY (EGD) WITH               PROPOFOL;  Surgeon: [name], MD;  Location:               [facility] ENDOSCOPY;  Service: Endoscopy;  Laterality: N/A; 10/28/2019: ESOPHAGOGASTRODUODENOSCOPY (EGD) WITH PROPOFOL; N/A     Comment:  Procedure: ESOPHAGOGASTRODUODENOSCOPY (EGD) WITH               PROPOFOL;  Surgeon: [name], MD;  Location:  [facility] ENDOSCOPY;  Service: Endoscopy;  Laterality: N/A; 12/02/2019: ESOPHAGOGASTRODUODENOSCOPY (EGD) WITH PROPOFOL; N/A     Comment:  Procedure: ESOPHAGOGASTRODUODENOSCOPY (EGD) WITH               PROPOFOL;  Surgeon: [name], MD;  Location:               [facility] ENDOSCOPY;  Service: Endoscopy;  Laterality: N/A; 02/18/2020: ESOPHAGOGASTRODUODENOSCOPY (EGD) WITH PROPOFOL; N/A     Comment:  Procedure: ESOPHAGOGASTRODUODENOSCOPY (EGD) WITH               PROPOFOL;  Surgeon: [name], MD;  Location:               [facility] ENDOSCOPY;  Service: Endoscopy;  Laterality: N/A; 05/28/2019: FRACTURE SURGERY; Right     Comment:  distal radius fracture No date: HERNIA REPAIR     Comment:  left inguinal hernia repair as an infant 02/11/2016:  KYPHOPLASTY; N/A     Comment:  Procedure: KYPHOPLASTY  L2,T9;  Surgeon: Hessie Knows,               MD;  Location: [facility] ORS;  Service: Orthopedics;                Laterality: N/A; 03/15/2016: KYPHOPLASTY; N/A     Comment:  Procedure: KYPHOPLASTY L3;  Surgeon: Hessie Knows, MD;                Location: [facility] ORS;  Service: Orthopedics;  Laterality:               N/A; 05/28/2019: OPEN REDUCTION INTERNAL FIXATION (ORIF) DISTAL RADIAL  FRACTURE; Right     Comment:  Procedure: OPEN REDUCTION INTERNAL FIXATION (ORIF)               DISTAL RADIAL FRACTURE;  Surgeon: Hessie Knows, MD;                Location: [facility] ORS;  Service: Orthopedics;  Laterality:               Right; No date: skin cancer removed No date: TONSILLECTOMY     Reproductive/Obstetrics negative OB ROS                             Anesthesia Physical Anesthesia Plan  ASA: 3  Anesthesia Plan: General   Post-op Pain Management:    Induction: Intravenous  PONV Risk Score and Plan: Propofol infusion and TIVA  Airway Management Planned: Natural Airway and Nasal Cannula  Additional Equipment:   Intra-op Plan:   Post-operative Plan:   Informed Consent: I have reviewed the patients History and Physical, chart, labs and discussed the procedure including the risks, benefits and alternatives for the proposed anesthesia with the patient or authorized representative who has indicated his/her understanding and acceptance.     Dental Advisory Given  Plan Discussed with: Anesthesiologist, CRNA and Surgeon  Anesthesia Plan Comments: (Patient consented for risks of anesthesia including but not limited to:  - adverse reactions to medications - risk of airway placement if required - damage to eyes, teeth, lips or other oral mucosa - nerve damage due to positioning  - sore throat or hoarseness - Damage to heart, brain, nerves, lungs, other parts of body or loss of life  Patient voiced understanding.)         Anesthesia Quick Evaluation

## 2021-08-02 NOTE — Op Note (Signed)
[facility] Patient Name: Charles Stanton Procedure Date: 08/02/2021 12:18 PM MRN: [id number] Account #: [id number] Date of Birth: 05/04/1955 Admit Type: Outpatient Age: 66 Room: [facility] ENDO ROOM 3 Gender: Male Note Status: Finalized Instrument Name: Upper Endoscope [id number] Procedure:             Upper GI endoscopy Indications:           Melena Providers:             Andrey Farmer MD, MD Referring MD:          [name], MD (Referring MD) Medicines:             Monitored Anesthesia Care Complications:         No immediate complications. Estimated blood loss:                         Minimal. Procedure:             Pre-Anesthesia Assessment:                        - Prior to the procedure, a History and Physical was                         performed, and patient medications and allergies were                         reviewed. The patient is competent. The risks and                         benefits of the procedure and the sedation options and                         risks were discussed with the patient. All questions                         were answered and informed consent was obtained.                         Patient identification and proposed procedure were                         verified by the physician, the nurse, the                         anesthesiologist, the anesthetist and the technician                         in the endoscopy suite. Mental Status Examination:                         alert and oriented. Airway Examination: normal                         oropharyngeal airway and neck mobility. Respiratory                         Examination: clear to auscultation. CV Examination:  normal. Prophylactic Antibiotics: The patient does not                         require prophylactic antibiotics. Prior                         Anticoagulants: The patient has taken no previous                          anticoagulant or antiplatelet agents. ASA Grade                         Assessment: III - A patient with severe systemic                         disease. After reviewing the risks and benefits, the                         patient was deemed in satisfactory condition to                         undergo the procedure. The anesthesia plan was to use                         monitored anesthesia care (MAC). Immediately prior to                         administration of medications, the patient was                         re-assessed for adequacy to receive sedatives. The                         heart rate, respiratory rate, oxygen saturations,                         blood pressure, adequacy of pulmonary ventilation, and                         response to care were monitored throughout the                         procedure. The physical status of the patient was                         re-assessed after the procedure.                        After obtaining informed consent, the endoscope was                         passed under direct vision. Throughout the procedure,                         the patient's blood pressure, pulse, and oxygen                         saturations were monitored continuously. The Endoscope  was introduced through the mouth, and advanced to the                         second part of duodenum. The upper GI endoscopy was                         somewhat difficult due to stricture. The patient                         tolerated the procedure well. Findings:      One benign-appearing, intrinsic severe (stenosis; an endoscope cannot       pass) stenosis was found. This stenosis measured 9 mm (inner diameter) x       less than one cm (in length). The stenosis was traversed after dilation.       A TTS dilator was passed through the scope. Dilation with a 10-14-10 mm       balloon dilator was performed to 12 mm. The dilation site was examined       and  showed moderate improvement in luminal narrowing. Estimated blood       loss was minimal.      A 7 cm hiatal hernia was present.      LA Grade D (one or more mucosal breaks involving at least 75% of       esophageal circumference) esophagitis with no bleeding was found.      A single umbilicated lesion was found in the gastric antrum. Biopsies       were taken with a cold forceps for histology. Estimated blood loss was       minimal.      The exam of the stomach was otherwise normal.      The examined duodenum was normal. Impression:            - Benign-appearing esophageal stenosis. Dilated.                        - 7 cm hiatal hernia.                        - LA Grade D reflux esophagitis with no bleeding.                        - A single lesion was found in the stomach. Biopsied.                        - Normal examined duodenum. Recommendation:        - Discharge patient to home.                        - Resume previous diet.                        - Continue present medications.                        - Await pathology results.                        - Repeat upper endoscopy at appointment to be  scheduled for retreatment.                        - Return to referring physician as previously                         scheduled. Procedure Code(s):     --- Professional ---                        [phone], Esophagogastroduodenoscopy, flexible,                         transoral; with transendoscopic balloon dilation of                         esophagus (less than 30 mm diameter) Diagnosis Code(s):     --- Professional ---                        K22.2, Esophageal obstruction                        K44.9, Diaphragmatic hernia without obstruction or                         gangrene                        K21.00, Gastro-esophageal reflux disease with                         esophagitis, without bleeding                        K31.89, Other diseases of stomach and  duodenum                        K92.1, Melena (includes Hematochezia) CPT copyright 2019 American Medical Association. All rights reserved. The codes documented in this report are preliminary and upon coder review may  be revised to meet current compliance requirements. Andrey Farmer MD, MD 08/02/2021 12:38:35 PM Number of Addenda: 0 Note Initiated On: 08/02/2021 12:18 PM Estimated Blood Loss:  Estimated blood loss was minimal.      [facility]

## 2021-08-02 NOTE — Interval H&P Note (Signed)
History and Physical Interval Note:  08/02/2021 12:12 PM  Charles Stanton  has presented today for surgery, with the diagnosis of Melena.  The various methods of treatment have been discussed with the patient and family. After consideration of risks, benefits and other options for treatment, the patient has consented to  Procedure(s) with comments: ESOPHAGOGASTRODUODENOSCOPY (EGD) WITH PROPOFOL (N/A) - REQUEST AM as a surgical intervention.  The patient's history has been reviewed, patient examined, no change in status, stable for surgery.  I have reviewed the patient's chart and labs.  Questions were answered to the patient's satisfaction.     [name]  Ok to proceed with EGD

## 2021-08-02 NOTE — Anesthesia Procedure Notes (Signed)
Date/Time: 08/02/2021 12:28 PM  Performed by: Donalda Ewings, CindyPre-anesthesia Checklist: Patient identified, Emergency Drugs available, Suction available, Patient being monitored and Timeout performed Patient Re-evaluated:Patient Re-evaluated prior to induction Oxygen Delivery Method: Nasal cannula Preoxygenation: Pre-oxygenation with 100% oxygen Induction Type: IV induction Airway Equipment and Method: Bite block Placement Confirmation: positive ETCO2 and CO2 detector

## 2021-08-02 NOTE — H&P (Signed)
Outpatient short stay form Pre-procedure 08/02/2021  Charles Rubenstein, MD  Primary Physician: [name], MD  Reason for visit:  Melena  History of present illness:    66 y/o gentleman with parkinson's here for EGD for report of melena. Has known esophageal stricture. Was dilated to 15 mm last year. No blood thinners. No neck surgeries.    Current Facility-Administered Medications:    0.9 %  sodium chloride infusion, , Intravenous, Continuous, Deb Loudin, Hilton Cork, MD  Medications Prior to Admission  Medication Sig Dispense Refill Last Dose   ascorbic acid (VITAMIN C) 500 MG tablet Take 1 tablet (500 mg total) by mouth daily. 30 tablet 0 08/01/2021   baclofen (LIORESAL) 10 MG tablet Take 10 mg by mouth daily.   0 08/01/2021   buPROPion (WELLBUTRIN XL) 150 MG 24 hr tablet Take 150 mg by mouth daily.   08/01/2021   carbidopa-levodopa (SINEMET IR) 25-100 MG tablet TK 1 T PO four times a day  1 08/02/2021   cholecalciferol (VITAMIN D) 25 MCG tablet Take 1 tablet (1,000 Units total) by mouth daily. 30 tablet 0 0/10/2723   folic acid (FOLVITE) 1 MG tablet Take 1 tablet (1 mg total) by mouth daily. 30 tablet 1 08/01/2021   gabapentin (NEURONTIN) 100 MG capsule Take 2 capsules (200 mg total) by mouth 2 (two) times daily. 60 capsule 0 08/01/2021   lisinopril (ZESTRIL) 5 MG tablet Take 1 tablet (5 mg total) by mouth at bedtime. 30 tablet 0 08/01/2021   pantoprazole (PROTONIX) 40 MG tablet Take 1 tablet (40 mg total) by mouth 2 (two) times daily. 60 tablet 1 08/01/2021   sertraline (ZOLOFT) 50 MG tablet Take 1 tablet (50 mg total) by mouth daily. 90 tablet 1 08/01/2021   thiamine 100 MG tablet Take 1 tablet (100 mg total) by mouth daily. 30 tablet 0 08/01/2021   vitamin B-12 (CYANOCOBALAMIN) 100 MCG tablet Take 100 mcg by mouth daily.   08/01/2021   zinc sulfate 220 (50 Zn) MG capsule Take 1 capsule (220 mg total) by mouth daily. 30 capsule 0 08/01/2021   amantadine (SYMMETREL) 100 MG capsule Take 50  mg by mouth daily. (Patient not taking: No sig reported)      carbidopa-levodopa (SINEMET CR) 50-200 MG tablet Take 1 tablet by mouth at bedtime.      Carbidopa-Levodopa ER (RYTARY) 23.75-95 MG CPCR Take 1 capsule by mouth daily.      Multiple Vitamin (MULTIVITAMIN WITH MINERALS) TABS tablet Take 1 tablet by mouth daily. 30 tablet 0    thiamine 100 MG tablet Take 1 tablet (100 mg total) by mouth daily. 30 tablet 0    traZODone (DESYREL) 100 MG tablet Take 100 mg by mouth at bedtime.        Allergies  Allergen Reactions   Amantadine Other (See Comments)    fatigue sleepy Other reaction(s): Other (See Comments) fatigue sleepy fatigue sleepy fatigue sleepy     Past Medical History:  Diagnosis Date   Abnormal liver function    Anemia    Anxiety    Atrial fibrillation ([place])    Cancer ([place]) 2017   skin   Colonic mass    Depression    Dizziness    Dry cough    History of colon polyps    Hypercholesteremia    Hyperlipemia    Hypertension    Palpitations    Parkinson's disease ([place])    Parkinson's disease ([place])    PVD (peripheral vascular disease) ([place])  Tonsillitis 03/25/2013   Tremors of nervous system     Review of systems:  Otherwise negative.    Physical Exam  Gen: Alert, oriented. Appears stated age.  HEENT: PERRLA. Lungs: No respiratory distress CV: RRR Abd: soft, benign, no masses Ext: No edema    Planned procedures: Proceed with EGD. The patient understands the nature of the planned procedure, indications, risks, alternatives and potential complications including but not limited to bleeding, infection, perforation, damage to internal organs and possible oversedation/side effects from anesthesia. The patient agrees and gives consent to proceed.  Please refer to procedure notes for findings, recommendations and patient disposition/instructions.     Charles Rubenstein, MD [facility]

## 2021-08-02 NOTE — Anesthesia Postprocedure Evaluation (Signed)
Anesthesia Post Note  Patient: Charles Stanton  Procedure(s) Performed: ESOPHAGOGASTRODUODENOSCOPY (EGD) WITH PROPOFOL  Patient location during evaluation: Endoscopy Anesthesia Type: General Level of consciousness: awake and alert Pain management: pain level controlled Vital Signs Assessment: post-procedure vital signs reviewed and stable Respiratory status: spontaneous breathing, nonlabored ventilation, respiratory function stable and patient connected to nasal cannula oxygen Cardiovascular status: blood pressure returned to baseline and stable Postop Assessment: no apparent nausea or vomiting Anesthetic complications: no   No notable events documented.   Last Vitals:  Vitals:   08/02/21 1247 08/02/21 1257  BP: 138/90 (!) 149/90  Pulse: 84 84  Resp: 18 20  Temp:    SpO2: 98%     Last Pain:  Vitals:   08/02/21 1257  TempSrc:   PainSc: 0-No pain                 Precious Haws Anushree Dorsi

## 2021-08-03 ENCOUNTER — Encounter: Payer: Self-pay | Admitting: Gastroenterology

## 2021-08-03 LAB — SURGICAL PATHOLOGY

## 2021-10-04 ENCOUNTER — Encounter: Payer: Self-pay | Admitting: *Deleted

## 2021-10-05 ENCOUNTER — Ambulatory Visit
Admission: RE | Admit: 2021-10-05 | Discharge: 2021-10-05 | Disposition: A | Discharge status: Home / Self Care | Payer: Medicare Other | Attending: Gastroenterology | Admitting: Gastroenterology

## 2021-10-05 ENCOUNTER — Encounter
Admission: RE | Disposition: A | Discharge status: Home / Self Care | Payer: Self-pay | Source: Home / Self Care | Attending: Gastroenterology

## 2021-10-05 ENCOUNTER — Ambulatory Visit: Payer: Medicare Other | Admitting: Anesthesiology

## 2021-10-05 DIAGNOSIS — Z539 Procedure and treatment not carried out, unspecified reason: Secondary | ICD-10-CM | POA: Diagnosis present

## 2021-10-05 HISTORY — PX: ESOPHAGOGASTRODUODENOSCOPY (EGD) WITH PROPOFOL: SHX5813

## 2021-10-05 SURGERY — ESOPHAGOGASTRODUODENOSCOPY (EGD) WITH PROPOFOL
Anesthesia: General

## 2021-10-05 MED ORDER — SODIUM CHLORIDE 0.9 % IV SOLN: INTRAVENOUS | Status: DC

## 2021-10-05 NOTE — Anesthesia Preprocedure Evaluation (Signed)
Anesthesia Evaluation  Patient identified by MRN, date of birth, ID band Patient awake    Reviewed: Allergy & Precautions, NPO status , Patient's Chart, lab work & pertinent test results  History of Anesthesia Complications Negative for: history of anesthetic complications  Airway Mallampati: III  TM Distance: <3 FB Neck ROM: full    Dental  (+) Missing   Pulmonary neg pulmonary ROS, neg shortness of breath,    Pulmonary exam normal        Cardiovascular Exercise Tolerance: Good hypertension, (-) anginaNormal cardiovascular exam     Neuro/Psych negative neurological ROS  negative psych ROS   GI/Hepatic Neg liver ROS, PUD, GERD  Controlled,  Endo/Other  diabetes, Type 2  Renal/GU negative Renal ROS  negative genitourinary   Musculoskeletal   Abdominal   Peds  Hematology negative hematology ROS (+)   Anesthesia Other Findings Past Medical History: No date: Abnormal liver function No date: Anemia No date: Anxiety No date: Atrial fibrillation ([place]) 2017: Cancer ([place])     Comment:  skin No date: Colonic mass No date: Depression No date: Dizziness No date: Dry cough No date: History of colon polyps No date: Hypercholesteremia No date: Hyperlipemia No date: Hypertension No date: Palpitations No date: Parkinson's disease No date: Parkinson's disease No date: PVD (peripheral vascular disease) ([place]) 03/25/2013: Tonsillitis No date: Tremors of nervous system  Past Surgical History: No date: BACK SURGERY     Comment:  Kyphoplasty   March 2018 No date: COLONOSCOPY 12/12/2017: COLONOSCOPY WITH PROPOFOL; N/A     Comment:  Procedure: COLONOSCOPY WITH PROPOFOL;  Surgeon: Jonathon Bellows, MD;  Location: [facility] ENDOSCOPY;  Service:               Gastroenterology;  Laterality: N/A; 12/11/2017: ESOPHAGOGASTRODUODENOSCOPY; N/A     Comment:  Procedure: ESOPHAGOGASTRODUODENOSCOPY (EGD);  Surgeon:                Jonathon Bellows, MD;  Location: [facility] ENDOSCOPY;  Service:               Gastroenterology;  Laterality: N/A; 09/21/2016: ESOPHAGOGASTRODUODENOSCOPY (EGD) WITH PROPOFOL; N/A     Comment:  Procedure: ESOPHAGOGASTRODUODENOSCOPY (EGD) WITH               PROPOFOL;  Surgeon: Jonathon Bellows, MD;  Location: [facility]               ENDOSCOPY;  Service: Gastroenterology;  Laterality: N/A; 11/07/2016: ESOPHAGOGASTRODUODENOSCOPY (EGD) WITH PROPOFOL; N/A     Comment:  Procedure: ESOPHAGOGASTRODUODENOSCOPY (EGD) WITH               PROPOFOL;  Surgeon: Jonathon Bellows, MD;  Location: [facility]               ENDOSCOPY;  Service: Gastroenterology;  Laterality: N/A; 10/15/2019: ESOPHAGOGASTRODUODENOSCOPY (EGD) WITH PROPOFOL; N/A     Comment:  Procedure: ESOPHAGOGASTRODUODENOSCOPY (EGD) WITH               PROPOFOL;  Surgeon: [name], MD;  Location:               [facility] ENDOSCOPY;  Service: Endoscopy;  Laterality: N/A; 10/28/2019: ESOPHAGOGASTRODUODENOSCOPY (EGD) WITH PROPOFOL; N/A     Comment:  Procedure: ESOPHAGOGASTRODUODENOSCOPY (EGD) WITH               PROPOFOL;  Surgeon: [name], MD;  Location:  [facility] ENDOSCOPY;  Service: Endoscopy;  Laterality: N/A; 12/02/2019: ESOPHAGOGASTRODUODENOSCOPY (EGD) WITH PROPOFOL; N/A     Comment:  Procedure: ESOPHAGOGASTRODUODENOSCOPY (EGD) WITH               PROPOFOL;  Surgeon: [name], MD;  Location:               [facility] ENDOSCOPY;  Service: Endoscopy;  Laterality: N/A; 02/18/2020: ESOPHAGOGASTRODUODENOSCOPY (EGD) WITH PROPOFOL; N/A     Comment:  Procedure: ESOPHAGOGASTRODUODENOSCOPY (EGD) WITH               PROPOFOL;  Surgeon: [name], MD;  Location:               [facility] ENDOSCOPY;  Service: Endoscopy;  Laterality: N/A; 08/02/2021: ESOPHAGOGASTRODUODENOSCOPY (EGD) WITH PROPOFOL; N/A     Comment:  Procedure: ESOPHAGOGASTRODUODENOSCOPY (EGD) WITH               PROPOFOL;  Surgeon: [name], MD;  Location:               [facility] ENDOSCOPY;   Service: Endoscopy;  Laterality: N/A;                REQUEST AM 05/28/2019: FRACTURE SURGERY; Right     Comment:  distal radius fracture No date: HERNIA REPAIR     Comment:  left inguinal hernia repair as an infant 02/11/2016: KYPHOPLASTY; N/A     Comment:  Procedure: KYPHOPLASTY  L2,T9;  Surgeon: Hessie Knows,               MD;  Location: [facility] ORS;  Service: Orthopedics;                Laterality: N/A; 03/15/2016: KYPHOPLASTY; N/A     Comment:  Procedure: KYPHOPLASTY L3;  Surgeon: Hessie Knows, MD;                Location: [facility] ORS;  Service: Orthopedics;  Laterality:               N/A; 05/28/2019: OPEN REDUCTION INTERNAL FIXATION (ORIF) DISTAL RADIAL  FRACTURE; Right     Comment:  Procedure: OPEN REDUCTION INTERNAL FIXATION (ORIF)               DISTAL RADIAL FRACTURE;  Surgeon: Hessie Knows, MD;                Location: [facility] ORS;  Service: Orthopedics;  Laterality:               Right; No date: skin cancer removed No date: TONSILLECTOMY  BMI    Body Mass Index: 20.98 kg/m      Reproductive/Obstetrics negative OB ROS                             Anesthesia Physical Anesthesia Plan  ASA: 3  Anesthesia Plan: General   Post-op Pain Management:    Induction: Intravenous  PONV Risk Score and Plan: Propofol infusion and TIVA  Airway Management Planned: Natural Airway and Nasal Cannula  Additional Equipment:   Intra-op Plan:   Post-operative Plan:   Informed Consent: I have reviewed the patients History and Physical, chart, labs and discussed the procedure including the risks, benefits and alternatives for the proposed anesthesia with the patient or authorized representative who has indicated his/her understanding and acceptance.     Dental Advisory Given  Plan Discussed with: Anesthesiologist, CRNA and Surgeon  Anesthesia Plan Comments: (Patient  consented for risks of anesthesia including but not limited to:  - adverse reactions to  medications - risk of airway placement if required - damage to eyes, teeth, lips or other oral mucosa - nerve damage due to positioning  - sore throat or hoarseness - Damage to heart, brain, nerves, lungs, other parts of body or loss of life  Patient voiced understanding.)        Anesthesia Quick Evaluation

## 2021-10-05 NOTE — OR Nursing (Signed)
Spouse states he did not discontinue Eliquis. Rescheduled for 10/6

## 2021-10-06 ENCOUNTER — Encounter: Payer: Self-pay | Admitting: Gastroenterology

## 2021-10-08 ENCOUNTER — Ambulatory Visit
Admission: RE | Admit: 2021-10-08 | Discharge: 2021-10-08 | Disposition: A | Discharge status: Home / Self Care | Payer: Medicare Other | Attending: Gastroenterology | Admitting: Gastroenterology

## 2021-10-08 ENCOUNTER — Ambulatory Visit: Payer: Medicare Other | Admitting: Anesthesiology

## 2021-10-08 ENCOUNTER — Encounter
Admission: RE | Disposition: A | Discharge status: Home / Self Care | Payer: Self-pay | Source: Home / Self Care | Attending: Gastroenterology

## 2021-10-08 DIAGNOSIS — I10 Essential (primary) hypertension: Secondary | ICD-10-CM | POA: Insufficient documentation

## 2021-10-08 DIAGNOSIS — E1151 Type 2 diabetes mellitus with diabetic peripheral angiopathy without gangrene: Secondary | ICD-10-CM | POA: Diagnosis not present

## 2021-10-08 DIAGNOSIS — K9289 Other specified diseases of the digestive system: Secondary | ICD-10-CM | POA: Diagnosis not present

## 2021-10-08 DIAGNOSIS — G20A1 Parkinson's disease without dyskinesia, without mention of fluctuations: Secondary | ICD-10-CM | POA: Insufficient documentation

## 2021-10-08 DIAGNOSIS — K222 Esophageal obstruction: Secondary | ICD-10-CM | POA: Insufficient documentation

## 2021-10-08 DIAGNOSIS — I4891 Unspecified atrial fibrillation: Secondary | ICD-10-CM | POA: Insufficient documentation

## 2021-10-08 DIAGNOSIS — K2270 Barrett's esophagus without dysplasia: Secondary | ICD-10-CM | POA: Insufficient documentation

## 2021-10-08 DIAGNOSIS — K219 Gastro-esophageal reflux disease without esophagitis: Secondary | ICD-10-CM | POA: Insufficient documentation

## 2021-10-08 DIAGNOSIS — K449 Diaphragmatic hernia without obstruction or gangrene: Secondary | ICD-10-CM | POA: Insufficient documentation

## 2021-10-08 HISTORY — PX: ESOPHAGOGASTRODUODENOSCOPY: SHX5428

## 2021-10-08 SURGERY — EGD (ESOPHAGOGASTRODUODENOSCOPY)
Anesthesia: General

## 2021-10-08 MED ORDER — PROPOFOL 500 MG/50ML IV EMUL
INTRAVENOUS | Status: DC | PRN
  Administered 2021-10-08: 125 ug/kg/min via INTRAVENOUS

## 2021-10-08 MED ORDER — LIDOCAINE HCL (CARDIAC) PF 100 MG/5ML IV SOSY
PREFILLED_SYRINGE | INTRAVENOUS | Status: DC | PRN
  Administered 2021-10-08: 40 mg via INTRAVENOUS

## 2021-10-08 MED ORDER — PROPOFOL 10 MG/ML IV BOLUS
INTRAVENOUS | Status: DC | PRN
  Administered 2021-10-08 (×2): 50 mg via INTRAVENOUS
  Administered 2021-10-08: 40 mg via INTRAVENOUS
  Administered 2021-10-08: 50 mg via INTRAVENOUS

## 2021-10-08 MED ORDER — SODIUM CHLORIDE 0.9 % IV SOLN
INTRAVENOUS | Status: DC
  Administered 2021-10-08: 20 mL/h via INTRAVENOUS

## 2021-10-08 NOTE — Anesthesia Preprocedure Evaluation (Addendum)
Anesthesia Evaluation  Patient identified by MRN, date of birth, ID band Patient awake    Reviewed: Allergy & Precautions, NPO status , Patient's Chart, lab work & pertinent test results  History of Anesthesia Complications Negative for: history of anesthetic complications  Airway Mallampati: III  TM Distance: <3 FB Neck ROM: full    Dental  (+) Missing   Pulmonary neg pulmonary ROS, neg shortness of breath,    Pulmonary exam normal        Cardiovascular Exercise Tolerance: Good hypertension, (-) angina+ Peripheral Vascular Disease  + dysrhythmias Atrial Fibrillation  Rhythm:Irregular Rate:Normal     Neuro/Psych PSYCHIATRIC DISORDERS Anxiety Depression Parkinson's disease    GI/Hepatic Neg liver ROS, PUD, GERD  Controlled,dysphagia   Endo/Other  diabetes, Type 2  Renal/GU negative Renal ROS  negative genitourinary   Musculoskeletal   Abdominal Normal abdominal exam  (+)   Peds  Hematology negative hematology ROS (+)   Anesthesia Other Findings Past Medical History: No date: Abnormal liver function No date: Anemia No date: Anxiety No date: Atrial fibrillation ([place]) 2017: Cancer ([place])     Comment:  skin No date: Colonic mass No date: Depression No date: Dizziness No date: Dry cough No date: History of colon polyps No date: Hypercholesteremia No date: Hyperlipemia No date: Hypertension No date: Palpitations No date: Parkinson's disease No date: Parkinson's disease No date: PVD (peripheral vascular disease) ([place]) 03/25/2013: Tonsillitis No date: Tremors of nervous system  Past Surgical History: No date: BACK SURGERY     Comment:  Kyphoplasty   March 2018 No date: COLONOSCOPY 12/12/2017: COLONOSCOPY WITH PROPOFOL; N/A     Comment:  Procedure: COLONOSCOPY WITH PROPOFOL;  Surgeon: Jonathon Bellows, MD;  Location: [facility] ENDOSCOPY;  Service:               Gastroenterology;  Laterality:  N/A; 12/11/2017: ESOPHAGOGASTRODUODENOSCOPY; N/A     Comment:  Procedure: ESOPHAGOGASTRODUODENOSCOPY (EGD);  Surgeon:               Jonathon Bellows, MD;  Location: [facility] ENDOSCOPY;  Service:               Gastroenterology;  Laterality: N/A; 09/21/2016: ESOPHAGOGASTRODUODENOSCOPY (EGD) WITH PROPOFOL; N/A     Comment:  Procedure: ESOPHAGOGASTRODUODENOSCOPY (EGD) WITH               PROPOFOL;  Surgeon: Jonathon Bellows, MD;  Location: [facility]               ENDOSCOPY;  Service: Gastroenterology;  Laterality: N/A; 11/07/2016: ESOPHAGOGASTRODUODENOSCOPY (EGD) WITH PROPOFOL; N/A     Comment:  Procedure: ESOPHAGOGASTRODUODENOSCOPY (EGD) WITH               PROPOFOL;  Surgeon: Jonathon Bellows, MD;  Location: [facility]               ENDOSCOPY;  Service: Gastroenterology;  Laterality: N/A; 10/15/2019: ESOPHAGOGASTRODUODENOSCOPY (EGD) WITH PROPOFOL; N/A     Comment:  Procedure: ESOPHAGOGASTRODUODENOSCOPY (EGD) WITH               PROPOFOL;  Surgeon: [name], MD;  Location:               [facility] ENDOSCOPY;  Service: Endoscopy;  Laterality: N/A; 10/28/2019: ESOPHAGOGASTRODUODENOSCOPY (EGD) WITH PROPOFOL; N/A     Comment:  Procedure: ESOPHAGOGASTRODUODENOSCOPY (EGD) WITH               PROPOFOL;  Surgeon: Haig Prophet,  Hilton Cork, MD;  Location:               [facility] ENDOSCOPY;  Service: Endoscopy;  Laterality: N/A; 12/02/2019: ESOPHAGOGASTRODUODENOSCOPY (EGD) WITH PROPOFOL; N/A     Comment:  Procedure: ESOPHAGOGASTRODUODENOSCOPY (EGD) WITH               PROPOFOL;  Surgeon: [name], MD;  Location:               [facility] ENDOSCOPY;  Service: Endoscopy;  Laterality: N/A; 02/18/2020: ESOPHAGOGASTRODUODENOSCOPY (EGD) WITH PROPOFOL; N/A     Comment:  Procedure: ESOPHAGOGASTRODUODENOSCOPY (EGD) WITH               PROPOFOL;  Surgeon: [name], MD;  Location:               [facility] ENDOSCOPY;  Service: Endoscopy;  Laterality: N/A; 08/02/2021: ESOPHAGOGASTRODUODENOSCOPY (EGD) WITH PROPOFOL; N/A     Comment:  Procedure:  ESOPHAGOGASTRODUODENOSCOPY (EGD) WITH               PROPOFOL;  Surgeon: [name], MD;  Location:               [facility] ENDOSCOPY;  Service: Endoscopy;  Laterality: N/A;                REQUEST AM 05/28/2019: FRACTURE SURGERY; Right     Comment:  distal radius fracture No date: HERNIA REPAIR     Comment:  left inguinal hernia repair as an infant 02/11/2016: KYPHOPLASTY; N/A     Comment:  Procedure: KYPHOPLASTY  L2,T9;  Surgeon: Hessie Knows,               MD;  Location: [facility] ORS;  Service: Orthopedics;                Laterality: N/A; 03/15/2016: KYPHOPLASTY; N/A     Comment:  Procedure: KYPHOPLASTY L3;  Surgeon: Hessie Knows, MD;                Location: [facility] ORS;  Service: Orthopedics;  Laterality:               N/A; 05/28/2019: OPEN REDUCTION INTERNAL FIXATION (ORIF) DISTAL RADIAL  FRACTURE; Right     Comment:  Procedure: OPEN REDUCTION INTERNAL FIXATION (ORIF)               DISTAL RADIAL FRACTURE;  Surgeon: Hessie Knows, MD;                Location: [facility] ORS;  Service: Orthopedics;  Laterality:               Right; No date: skin cancer removed No date: TONSILLECTOMY  BMI    Body Mass Index: 20.98 kg/m      Reproductive/Obstetrics negative OB ROS                            Anesthesia Physical  Anesthesia Plan  ASA: 3  Anesthesia Plan: General   Post-op Pain Management:    Induction: Intravenous  PONV Risk Score and Plan: Propofol infusion and TIVA  Airway Management Planned: Natural Airway and Nasal Cannula  Additional Equipment:   Intra-op Plan:   Post-operative Plan:   Informed Consent: I have reviewed the patients History and Physical, chart, labs and discussed the procedure including the risks, benefits and alternatives for the proposed anesthesia with the patient or authorized representative who has indicated his/her understanding and acceptance.  Dental Advisory Given  Plan Discussed with: Anesthesiologist, CRNA and  Surgeon  Anesthesia Plan Comments:        Anesthesia Quick Evaluation

## 2021-10-08 NOTE — Interval H&P Note (Signed)
History and Physical Interval Note:  10/08/2021 10:58 AM  Charles Stanton  has presented today for surgery, with the diagnosis of Dysphagia.  The various methods of treatment have been discussed with the patient and family. After consideration of risks, benefits and other options for treatment, the patient has consented to  Procedure(s): ESOPHAGOGASTRODUODENOSCOPY (EGD) (N/A) as a surgical intervention.  The patient's history has been reviewed, patient examined, no change in status, stable for surgery.  I have reviewed the patient's chart and labs.  Questions were answered to the patient's satisfaction.     [name]  Ok to proceed with EGD

## 2021-10-08 NOTE — H&P (Signed)
Outpatient short stay form Pre-procedure 10/08/2021  [name], MD  Primary Physician: [name], MD  Reason for visit:  Esophageal stricture  History of present illness:    66 y/o gentleman with parkinsons and a. Fib with last dose two days ago here for EGD for dilation of stricture.     Current Facility-Administered Medications:    0.9 %  sodium chloride infusion, , Intravenous, Continuous, Merridith Dershem, Hilton Cork, MD, Last Rate: 20 mL/hr at 10/08/21 1027, 20 mL/hr at 10/08/21 1027  Medications Prior to Admission  Medication Sig Dispense Refill Last Dose   Apixaban (ELIQUIS PO) Take by mouth.   Past Week   ascorbic acid (VITAMIN C) 500 MG tablet Take 1 tablet (500 mg total) by mouth daily. 30 tablet 0 10/07/2021   baclofen (LIORESAL) 10 MG tablet Take 10 mg by mouth daily.   0 10/07/2021   buPROPion (WELLBUTRIN XL) 150 MG 24 hr tablet Take 150 mg by mouth daily.   10/07/2021   carbidopa-levodopa (SINEMET CR) 50-200 MG tablet Take 1 tablet by mouth at bedtime.   10/07/2021   carbidopa-levodopa (SINEMET IR) 25-100 MG tablet TK 1 T PO four times a day  1 10/07/2021   Carbidopa-Levodopa ER (RYTARY) 23.75-95 MG CPCR Take 1 capsule by mouth daily.   10/08/2021 at 0800   cholecalciferol (VITAMIN D) 25 MCG tablet Take 1 tablet (1,000 Units total) by mouth daily. 30 tablet 0 43/03/2949   folic acid (FOLVITE) 1 MG tablet Take 1 tablet (1 mg total) by mouth daily. 30 tablet 1 10/07/2021   gabapentin (NEURONTIN) 100 MG capsule Take 2 capsules (200 mg total) by mouth 2 (two) times daily. 60 capsule 0 10/07/2021   lisinopril (ZESTRIL) 5 MG tablet Take 1 tablet (5 mg total) by mouth at bedtime. 30 tablet 0 10/07/2021   Multiple Vitamin (MULTIVITAMIN WITH MINERALS) TABS tablet Take 1 tablet by mouth daily. 30 tablet 0 10/07/2021   pantoprazole (PROTONIX) 40 MG tablet Take 1 tablet (40 mg total) by mouth 2 (two) times daily. 60 tablet 1 10/07/2021   sertraline (ZOLOFT) 50 MG tablet Take 1 tablet (50  mg total) by mouth daily. 90 tablet 1 10/07/2021   thiamine 100 MG tablet Take 1 tablet (100 mg total) by mouth daily. 30 tablet 0 10/07/2021   thiamine 100 MG tablet Take 1 tablet (100 mg total) by mouth daily. 30 tablet 0 10/07/2021   traZODone (DESYREL) 100 MG tablet Take 100 mg by mouth at bedtime.   10/07/2021   vitamin B-12 (CYANOCOBALAMIN) 100 MCG tablet Take 100 mcg by mouth daily.   10/07/2021   zinc sulfate 220 (50 Zn) MG capsule Take 1 capsule (220 mg total) by mouth daily. 30 capsule 0 10/07/2021   amantadine (SYMMETREL) 100 MG capsule Take 50 mg by mouth daily. (Patient not taking: No sig reported)        Allergies  Allergen Reactions   Amantadine Other (See Comments)    fatigue sleepy Other reaction(s): Other (See Comments) fatigue sleepy fatigue sleepy fatigue sleepy     Past Medical History:  Diagnosis Date   Abnormal liver function    Anemia    Anxiety    Atrial fibrillation ([place])    Cancer ([place]) 2017   skin   Colonic mass    Depression    Dizziness    Dry cough    History of colon polyps    Hypercholesteremia    Hyperlipemia    Hypertension    Palpitations  Parkinson's disease    Parkinson's disease    PVD (peripheral vascular disease) ([place])    Tonsillitis 03/25/2013   Tremors of nervous system     Review of systems:  Otherwise negative.    Physical Exam  Gen: Alert, oriented. Appears stated age.  HEENT: PERRLA. Lungs: No respiratory distress CV: RRR Abd: soft, benign, no masses Ext: No edema    Planned procedures: Proceed with EGD. The patient understands the nature of the planned procedure, indications, risks, alternatives and potential complications including but not limited to bleeding, infection, perforation, damage to internal organs and possible oversedation/side effects from anesthesia. The patient agrees and gives consent to proceed.  Please refer to procedure notes for findings, recommendations and patient disposition/instructions.      [name], MD [facility]

## 2021-10-08 NOTE — Op Note (Signed)
[facility] Gastroenter[facility] Patient Name: Charles Stanton Procedure Date: 10/08/2021 10:39 AM MRN: [id number] Account #: [id number] Date of Birth: [birth year] Admit Type: Outpatient Age: 66 Room: [facility] ENDO ROOM 3 Gender: Male Note Status: Finalized Instrument Name: Upper Endoscope [phone] [id number] Procedure:             Upper GI endoscopy Indications:           Stenosis of the GI tract Providers:             Andrey Farmer MD, MD Referring MD:          [name], MD (Referring MD) Medicines:             Monitored Anesthesia Care Complications:         No immediate complications. Estimated blood loss:                         Minimal. Procedure:             Pre-Anesthesia Assessment:                        - Prior to the procedure, a History and Physical was                         performed, and patient medications and allergies were                         reviewed. The patient is competent. The risks and                         benefits of the procedure and the sedation options and                         risks were discussed with the patient. All questions                         were answered and informed consent was obtained.                         Patient identification and proposed procedure were                         verified by the physician, the nurse, the                         anesthesiologist, the anesthetist and the technician                         in the endoscopy suite. Mental Status Examination:                         alert and oriented. Airway Examination: normal                         oropharyngeal airway and neck mobility. Respiratory                         Examination: clear to auscultation. CV Examination:  normal. Prophylactic Antibiotics: The patient does not                         require prophylactic antibiotics. Prior                         Anticoagulants: The patient has taken Eliquis                          (apixaban), last dose was 2 days prior to procedure.                         ASA Grade Assessment: III - A patient with severe                         systemic disease. After reviewing the risks and                         benefits, the patient was deemed in satisfactory                         condition to undergo the procedure. The anesthesia                         plan was to use monitored anesthesia care (MAC).                         Immediately prior to administration of medications,                         the patient was re-assessed for adequacy to receive                         sedatives. The heart rate, respiratory rate, oxygen                         saturations, blood pressure, adequacy of pulmonary                         ventilation, and response to care were monitored                         throughout the procedure. The physical status of the                         patient was re-assessed after the procedure.                        After obtaining informed consent, the endoscope was                         passed under direct vision. Throughout the procedure,                         the patient's blood pressure, pulse, and oxygen                         saturations were monitored continuously. The Endoscope  was introduced through the mouth, and advanced to the                         second part of duodenum. The upper GI endoscopy was                         accomplished without difficulty. The patient tolerated                         the procedure well. Findings:      One benign-appearing, intrinsic mild stenosis was found. This stenosis       measured less than one cm (in length). The stenosis was traversed. A TTS       dilator was passed through the scope. Was initially dilated from 13 to       15 with minimal change. Dilation with a 15-16.5-18 mm balloon dilator       was performed to 16.5 mm. The dilation site was examined and showed mild        mucosal disruption. Estimated blood loss was minimal.      A 10 cm hiatal hernia was present.      Circumferential salmon-colored mucosa was present from 23 to 30 cm. The       maximum longitudinal extent of these esophageal mucosal changes was 7 cm       in length. Biopsies were taken with a cold forceps for histology.       Estimated blood loss was minimal.      The entire examined stomach was normal.      The examined duodenum was normal. Impression:            - Benign-appearing esophageal stenosis. Dilated.                        - 10 cm hiatal hernia.                        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 Biopsied.                        - Normal stomach.                        - Normal examined duodenum. Recommendation:        - Discharge patient to home.                        - Resume previous diet.                        - Resume Eliquis (apixaban) at prior dose tomorrow.                        - Await pathology results.                        - Repeat upper endoscopy after studies are complete.                        -  Return to referring physician as previously                         scheduled. Procedure Code(s):     --- Professional ---                        [phone], Esophagogastroduodenoscopy, flexible,                         transoral; with transendoscopic balloon dilation of                         esophagus (less than 30 mm diameter) Diagnosis Code(s):     --- Professional ---                        K22.2, Esophageal obstruction                        K44.9, Diaphragmatic hernia without obstruction or                         gangrene                        K22.70, Barrett's esophagus without dysplasia                        K92.89, Other specified diseases of the digestive                         system CPT copyright 2019 American Medical Association. All rights reserved. The codes  documented in this report are preliminary and upon coder review may  be revised to meet current compliance requirements. Andrey Farmer MD, MD 10/08/2021 11:26:09 AM Number of Addenda: 0 Note Initiated On: 10/08/2021 10:39 AM Estimated Blood Loss:  Estimated blood loss was minimal.      [facility]

## 2021-10-08 NOTE — Transfer of Care (Signed)
Immediate Anesthesia Transfer of Care Note  Patient: Melchor Kirchgessner Moree  Procedure(s) Performed: ESOPHAGOGASTRODUODENOSCOPY (EGD)  Patient Location: PACU  Anesthesia Type:General  Level of Consciousness: awake and alert   Airway & Oxygen Therapy: Patient Spontanous Breathing  Post-op Assessment: Report given to RN and Post -op Vital signs reviewed and stable  Post vital signs: Reviewed and stable  Last Vitals:  Vitals Value Taken Time  BP 105/55 10/08/21 1125  Temp 36.6 C 10/08/21 1122  Pulse 81 10/08/21 1126  Resp 19 10/08/21 1126  SpO2 93 % 10/08/21 1126  Vitals shown include unvalidated device data.  Last Pain:  Vitals:   10/08/21 1122  TempSrc: Temporal  PainSc: 0-No pain         Complications: No notable events documented.

## 2021-10-08 NOTE — Anesthesia Postprocedure Evaluation (Signed)
Anesthesia Post Note  Patient: Charles Stanton  Procedure(s) Performed: ESOPHAGOGASTRODUODENOSCOPY (EGD)  Patient location during evaluation: Endoscopy Anesthesia Type: General Level of consciousness: awake and alert Pain management: pain level controlled Vital Signs Assessment: post-procedure vital signs reviewed and stable Respiratory status: spontaneous breathing, nonlabored ventilation and respiratory function stable Cardiovascular status: blood pressure returned to baseline and stable Postop Assessment: no apparent nausea or vomiting Anesthetic complications: no   No notable events documented.   Last Vitals:  Vitals:   10/08/21 1132 10/08/21 1142  BP: 112/70 133/76  Pulse: 79 78  Resp: (!) 22 (!) 25  Temp:    SpO2: 95% 94%    Last Pain:  Vitals:   10/08/21 1142  TempSrc:   PainSc: 0-No pain                 [place] Ouch

## 2021-10-11 ENCOUNTER — Encounter: Payer: Self-pay | Admitting: Gastroenterology

## 2021-10-11 LAB — SURGICAL PATHOLOGY

## 2022-03-01 ENCOUNTER — Ambulatory Visit: Payer: Medicare Other | Attending: Neurology

## 2022-03-01 DIAGNOSIS — M6281 Muscle weakness (generalized): Secondary | ICD-10-CM | POA: Diagnosis present

## 2022-03-01 DIAGNOSIS — R251 Tremor, unspecified: Secondary | ICD-10-CM | POA: Insufficient documentation

## 2022-03-01 DIAGNOSIS — R2681 Unsteadiness on feet: Secondary | ICD-10-CM | POA: Diagnosis present

## 2022-03-01 NOTE — Addendum Note (Signed)
Addended by: Etta Grandchild on: 03/01/2022 03:58 PM   Modules accepted: Orders

## 2022-03-01 NOTE — Therapy (Signed)
[place] at [facility] [place], [place], [address] Phone: [phone]   Fax:  [phone]  Outpatient Physical Therapy Evaluation  Patient Details  Name: Charles Stanton MRN: [id number] Date of Birth: [birth year] Referring Provider (PT): Dr. Jennings Books   Encounter Date: 03/01/2022   PT End of Session - 03/01/22 1454     Visit Number 1    Number of Visits 16    Date for PT Re-Evaluation 04/26/22    Authorization Type Medicare A & B primary; federal employee secondary    Authorization Time Period 03/01/22-04/26/22    Progress Note Due on Visit 10    PT Start Time 1435    PT Stop Time 1515    PT Time Calculation (min) 40 min    Equipment Utilized During Treatment Gait belt    Activity Tolerance Patient tolerated treatment well;No increased pain    Behavior During Therapy Charles Stanton for tasks assessed/performed             Past Medical History:  Diagnosis Date   Abnormal liver function    Anemia    Anxiety    Atrial fibrillation ([place])    Cancer ([place]) 2017   skin   Colonic mass    Depression    Dizziness    Dry cough    History of colon polyps    Hypercholesteremia    Hyperlipemia    Hypertension    Palpitations    Parkinson's disease    Parkinson's disease    PVD (peripheral vascular disease) ([place])    Tonsillitis 03/25/2013   Tremors of nervous system     Past Surgical History:  Procedure Laterality Date   BACK SURGERY     Kyphoplasty   March 2018   COLONOSCOPY     COLONOSCOPY WITH PROPOFOL N/A 12/12/2017   Procedure: COLONOSCOPY WITH PROPOFOL;  Surgeon: Jonathon Bellows, MD;  Location: [facility] ENDOSCOPY;  Service: Gastroenterology;  Laterality: N/A;   ESOPHAGOGASTRODUODENOSCOPY N/A 12/11/2017   Procedure: ESOPHAGOGASTRODUODENOSCOPY (EGD);  Surgeon: Jonathon Bellows, MD;  Location: [facility] ENDOSCOPY;  Service: Gastroenterology;  Laterality: N/A;   ESOPHAGOGASTRODUODENOSCOPY N/A 10/08/2021   Procedure:  ESOPHAGOGASTRODUODENOSCOPY (EGD);  Surgeon: [name], MD;  Location: [facility] ENDOSCOPY;  Service: Endoscopy;  Laterality: N/A;   ESOPHAGOGASTRODUODENOSCOPY (EGD) WITH PROPOFOL N/A 09/21/2016   Procedure: ESOPHAGOGASTRODUODENOSCOPY (EGD) WITH PROPOFOL;  Surgeon: Jonathon Bellows, MD;  Location: [facility] ENDOSCOPY;  Service: Gastroenterology;  Laterality: N/A;   ESOPHAGOGASTRODUODENOSCOPY (EGD) WITH PROPOFOL N/A 11/07/2016   Procedure: ESOPHAGOGASTRODUODENOSCOPY (EGD) WITH PROPOFOL;  Surgeon: Jonathon Bellows, MD;  Location: [facility] ENDOSCOPY;  Service: Gastroenterology;  Laterality: N/A;   ESOPHAGOGASTRODUODENOSCOPY (EGD) WITH PROPOFOL N/A 10/15/2019   Procedure: ESOPHAGOGASTRODUODENOSCOPY (EGD) WITH PROPOFOL;  Surgeon: [name], MD;  Location: [facility] ENDOSCOPY;  Service: Endoscopy;  Laterality: N/A;   ESOPHAGOGASTRODUODENOSCOPY (EGD) WITH PROPOFOL N/A 10/28/2019   Procedure: ESOPHAGOGASTRODUODENOSCOPY (EGD) WITH PROPOFOL;  Surgeon: [name], MD;  Location: [facility] ENDOSCOPY;  Service: Endoscopy;  Laterality: N/A;   ESOPHAGOGASTRODUODENOSCOPY (EGD) WITH PROPOFOL N/A 12/02/2019   Procedure: ESOPHAGOGASTRODUODENOSCOPY (EGD) WITH PROPOFOL;  Surgeon: [name], MD;  Location: [facility] ENDOSCOPY;  Service: Endoscopy;  Laterality: N/A;   ESOPHAGOGASTRODUODENOSCOPY (EGD) WITH PROPOFOL N/A 02/18/2020   Procedure: ESOPHAGOGASTRODUODENOSCOPY (EGD) WITH PROPOFOL;  Surgeon: [name], MD;  Location: [facility] ENDOSCOPY;  Service: Endoscopy;  Laterality: N/A;   ESOPHAGOGASTRODUODENOSCOPY (EGD) WITH PROPOFOL N/A 08/02/2021   Procedure: ESOPHAGOGASTRODUODENOSCOPY (EGD) WITH PROPOFOL;  Surgeon: [name], MD;  Location: [facility] ENDOSCOPY;  Service: Endoscopy;  Laterality: N/A;  REQUEST AM   ESOPHAGOGASTRODUODENOSCOPY (EGD) WITH PROPOFOL N/A 10/05/2021   Procedure: ESOPHAGOGASTRODUODENOSCOPY (EGD) WITH PROPOFOL;  Surgeon: [name], MD;  Location: [facility] ENDOSCOPY;  Service: Endoscopy;   Laterality: N/A;   FRACTURE SURGERY Right 05/28/2019   distal radius fracture   HERNIA REPAIR     left inguinal hernia repair as an infant   KYPHOPLASTY N/A 02/11/2016   Procedure: KYPHOPLASTY  L2,T9;  Surgeon: Hessie Knows, MD;  Location: [facility] ORS;  Service: Orthopedics;  Laterality: N/A;   KYPHOPLASTY N/A 03/15/2016   Procedure: KYPHOPLASTY L3;  Surgeon: Hessie Knows, MD;  Location: [facility] ORS;  Service: Orthopedics;  Laterality: N/A;   OPEN REDUCTION INTERNAL FIXATION (ORIF) DISTAL RADIAL FRACTURE Right 05/28/2019   Procedure: OPEN REDUCTION INTERNAL FIXATION (ORIF) DISTAL RADIAL FRACTURE;  Surgeon: Hessie Knows, MD;  Location: [facility] ORS;  Service: Orthopedics;  Laterality: Right;   skin cancer removed     TONSILLECTOMY       Subjective Assessment -      Subjective Pt referred to OPPT for moblity limitations in the setting of advanced parkinsonism. Pt goes for a pre-surgical appointment regarding a known inguinal hernia. Author asks about history of ETOH abuse in record, pt denies any history of ETOH abuse.    Pertinent History Charles Stanton is a 51yoM who was prescribed with Parkinson's Disease ~ 10 years.  Pt is followed by neurology, reports good reponse to medications which help control his tremors somewhat. Pt uses a SPC for AMB, also uses a walker for longer distance walking. Pt goes into community almost daily (they like to eat out) which includes lots of SPC [place] eand navigation of 4 stairs at home.    Currently in Pain? No/denies                [facility] PT Assessment - 03/01/22 0001       Assessment   Medical Diagnosis Parkinson's disease    Referring Provider (PT) Dr. Jennings Books    Onset Date/Surgical Date --  Pt diagnosed about 10 years ago   Hand Dominance Right    Prior Therapy some prior PT in home and in outpatient      Precautions   Precautions Fall      Balance Screen   Has the patient fallen in the past 6 months Yes    How many times? 2    Has the patient had a  decrease in activity level because of a fear of falling?  No    Is the patient reluctant to leave their home because of a fear of falling?  No      Home Ecologist residence    Living Arrangements Spouse/significant other      Observation/Other Assessments   Focus on Therapeutic Outcomes (FOTO)  49      Transfers   Transfers Sit to [place] to Stand 4: Min guard    Five time sit to stand comments  22sec   able ot perform hands-free; author must block both toes due to knee extension > hip extension moment   Number of Reps 10 reps;1 set      Ambulation/Gait   Ambulation/Gait Yes    Ambulation/Gait Assistance 5: Supervision    Ambulation Distance (Feet) 450 Feet    Assistive device Straight cane   typically would use a 4WW for this type of distance at most recent baseline   Gait Pattern --  tends to carry [facility] in LUE rather than consistent sequenced weightbearing through [facility]   Ambulation Surface Level;Indoor    Gait velocity 0.36ms    Gait Comments Pt reports has not walked this far in a long time      Balance   Balance Assessed Yes      Standardized Balance Assessment   Standardized Balance Assessment --   airex pad balance screening  -Airex normal stance eyes open x30sec (increased sway, increased falls anxiety, needs help with safe mount/dismount of pad)  -Airex normal stance eyes closed- critical LOB at 10sec requiring intervention from author to arrest LOB. Immediate increase in tremor and heavy sway.             Objective measurements completed on examination: See above findings.   -STS from elevated surface (chair + balance pad) 1x10, hands free, able ot perform without foot block and without >supervision level safety; discussion about setting up higher surface at home for future HEP assignments and importance of getting in # of reps for strength building and balance training.       PT Education -     Education Details Educated  on role of FOTO for outcomes monitoring.    Person(s) Educated Patient    Methods Explanation;Demonstration    Comprehension Verbalized understanding;Returned demonstration;Need further instruction              PT Short Term Goals -       PT SHORT TERM GOAL #1   Title Pt to report regular performance of exercises prescribed for home and a sense of improvements in strength and balance AEB them not being as challenging anymore.    Baseline Will issue on visit 2    Time 4    Period Weeks    Status New    Target Date 03/15/22      PT SHORT TERM GOAL #2   Title Pt to demonstrate improved power AEB improved 5xSTS hands free in <18sec.    Baseline 03/01/22: 22sec hands free, author provideing foot block bilat    Time 4    Period Weeks    Status New    Target Date 03/29/22               PT Long Term Goals -       PT LONG TERM GOAL #1   Title Pt to improve score on FOTO survey to indicated reduced difficulty with basic mobility required for ADL performance.    Baseline 03/01/22:49    Time 8    Period Weeks    Status New    Target Date 04/26/22      PT LONG TERM GOAL #2   Title Pt to improve tolerance to overground AMB c SPC AEB 6MWT performance >8065f    Baseline 2/27: fatigued after 45093fap performed in four minutes ten seconds.    Time 8    Period Weeks    Status New    Target Date 04/26/22      PT LONG TERM GOAL #3   Title Pt to demonstrate improved leg power AEB 5xSTS from chair <14sec hands free and without need for minGuard assist for safety and without need for foot block.    Baseline 03/01/22: 22sec hands free, feet blocked, minGuard assist    Time 8    Period Weeks    Status New    Target Date 04/26/22      PT LONG TERM GOAL #4   Title  Pt to demonstrate improvement in balance performance AEB >MCID improvement on to-be-determined objective outcome measure for balance assessment.    Baseline No reassessment performed at this time.    Time 8    Period  Weeks    Status New    Target Date 04/26/22              Plan -     Clinical Impression Statement Pt presenting to OPPT for evaluation of ongoing decline in basic mobility (decrease AMB tolerance, increased imbalance, decreased strength in legs) in the setting of chornic Parkinsonism. Pt reports it's harder to get up from a chair or couch these days, he requires use of 4WW for longer than household distance AMB, and he has had 2 falls in the past 6 months. Examination reveals decreased AMB tolerance AEB gait speed from walk test over 425f c SPC, decreased balance AEB criticla LOB while on airex pad eyes closed after 10sec, and decreased power in legs AEB 5xSTS 22s wherein pt required assistance in blocking shoes for performance and standby assist for safety due to postural ataxia during test. A skilled PT intervention is just what the doctor ordered which will provide guided and targeted interventions aimed at improving outcome measures indicating reduceds risk of falling, improved independence in ADL performance, and improved tolerance to IADL and community distance AMB.    Personal Factors and Comorbidities Time since onset of injury/illness/exacerbation;Past/Current Experience    Examination-Activity Limitations Locomotion Level;Transfers;Bed Mobility;Reach Overhead;Hygiene/Grooming;Dressing;Toileting;Stairs;Bend    Stability/Clinical Decision Making Evolving/Moderate complexity    Clinical Decision Making Moderate    Rehab Potential Good    PT Frequency 2x / week    PT Duration 8 weeks    PT Treatment/Interventions ADLs/Self Care Home Management;Moist Heat;Gait training;Stair training;Functional mobility training;Therapeutic activities;Therapeutic exercise;Balance training;Neuromuscular re-education;Patient/family education    PT Next Visit Plan More extensive balance assessment measure (consider mini BesTest for validity); develop HEP    PT Home Exercise Plan 03/01/22: discussed finding  and elevated sit surface for future STS exercises (something ~18-19 inches high)    Consulted and Agree with Plan of Care Patient             Patient will benefit from skilled therapeutic intervention in order to improve the following deficits and impairments:  Abnormal gait, Difficulty walking, Decreased range of motion, Decreased activity tolerance, Decreased balance, Decreased mobility, Decreased strength, Postural dysfunction  Visit Diagnosis: Muscle weakness (generalized)  Unsteadiness on feet  Tremor     Problem List Patient Active Problem List   Diagnosis Date Noted   Acute respiratory failure due to COVID-19 ([place] 05/28/2019   Alcohol dependence with uncomplicated withdrawal ([place] 01/23/2018   Alcohol withdrawal ([place] 01/23/2018   GIB (gastrointestinal bleeding) 12/09/2017   A-fib ([place] 12/09/2017   Esophageal obstruction 10/10/2016   Esophageal ulceration 10/10/2016   GI bleed 09/20/2016   Low vitamin B12 level 03/18/2016   S/P kyphoplasty 03/18/2016   Basal cell carcinoma 02/08/2016   Atrial fibrillation ([place] 01/27/2016   Colitis 01/27/2016   HLD (hyperlipidemia) 01/27/2016   Anxiety 01/27/2016   Hypokalemia 01/27/2016   Major depressive disorder, recurrent episode, moderate ([place] 04/14/2015   Anxiety, generalized 12/06/2013   Parkinson disease 08/07/2013   Dysphagia 05/12/2013   Esophageal mass 05/12/2013   GERD (gastroesophageal reflux disease) 05/12/2013   Vomiting 05/12/2013   Open bite of lower leg 05/10/2013   Dog bite of lower leg 05/10/2013   Abnormal finding on liver function 03/25/2013   Benign neoplasm 03/25/2013  Dizziness 03/25/2013   Type 2 diabetes mellitus ([place]) 03/25/2013   BP (high blood pressure) 03/25/2013   Awareness of heartbeats 03/25/2013   Pure hypercholesterolemia 03/25/2013   Infective tonsillitis 03/25/2013   Adenomatous polyp 03/25/2013   3:53 PM, 03/01/22 Charles [facility], PT, DPT Physical Therapist - [facility]  Outpatient Physical Therapy- [place], PT 03/01/2022, 3:43 PM  [facility] [place], [place], [address] Phone: [phone]   Fax:  [phone]  Name: Charles [facility] MRN: [id number] Date of Birth: [birth year]

## 2022-03-03 ENCOUNTER — Encounter: Payer: Self-pay | Admitting: Physical Therapy

## 2022-03-03 ENCOUNTER — Ambulatory Visit: Payer: Medicare Other | Admitting: Physical Therapy

## 2022-03-03 DIAGNOSIS — M6281 Muscle weakness (generalized): Secondary | ICD-10-CM

## 2022-03-03 DIAGNOSIS — R251 Tremor, unspecified: Secondary | ICD-10-CM

## 2022-03-03 DIAGNOSIS — R2681 Unsteadiness on feet: Secondary | ICD-10-CM

## 2022-03-03 NOTE — Therapy (Signed)
[place] at [facility] [place], [place], [address] Phone: [phone]   Fax:  [phone]  Outpatient Physical Therapy Evaluation  Patient Details  Name: Charles Stanton MRN: [id number] Date of Birth: [birth year] Referring Provider (PT): Dr. Jennings Books   Encounter Date: 03/03/2022   PT End of Session - 03/03/22 1450     Visit Number 2    Number of Visits 16    Date for PT Re-Evaluation 04/26/22    Authorization Type Medicare A & B primary; federal employee secondary    Authorization Time Period 03/01/22-04/26/22    Progress Note Due on Visit 10    Equipment Utilized During Treatment Gait belt    Activity Tolerance Patient tolerated treatment well;No increased pain    Behavior During Therapy [facility] for tasks assessed/performed              Past Medical History:  Diagnosis Date   Abnormal liver function    Anemia    Anxiety    Atrial fibrillation ([place])    Cancer ([place]) 2017   skin   Colonic mass    Depression    Dizziness    Dry cough    History of colon polyps    Hypercholesteremia    Hyperlipemia    Hypertension    Palpitations    Parkinson's disease    Parkinson's disease    PVD (peripheral vascular disease) ([place])    Tonsillitis 03/25/2013   Tremors of nervous system     Past Surgical History:  Procedure Laterality Date   BACK SURGERY     Kyphoplasty   March 2018   COLONOSCOPY     COLONOSCOPY WITH PROPOFOL N/A 12/12/2017   Procedure: COLONOSCOPY WITH PROPOFOL;  Surgeon: Jonathon Bellows, MD;  Location: [facility] ENDOSCOPY;  Service: Gastroenterology;  Laterality: N/A;   ESOPHAGOGASTRODUODENOSCOPY N/A 12/11/2017   Procedure: ESOPHAGOGASTRODUODENOSCOPY (EGD);  Surgeon: Jonathon Bellows, MD;  Location: [facility] ENDOSCOPY;  Service: Gastroenterology;  Laterality: N/A;   ESOPHAGOGASTRODUODENOSCOPY N/A 10/08/2021   Procedure: ESOPHAGOGASTRODUODENOSCOPY (EGD);  Surgeon: [name], MD;  Location: [facility] ENDOSCOPY;   Service: Endoscopy;  Laterality: N/A;   ESOPHAGOGASTRODUODENOSCOPY (EGD) WITH PROPOFOL N/A 09/21/2016   Procedure: ESOPHAGOGASTRODUODENOSCOPY (EGD) WITH PROPOFOL;  Surgeon: Jonathon Bellows, MD;  Location: [facility] ENDOSCOPY;  Service: Gastroenterology;  Laterality: N/A;   ESOPHAGOGASTRODUODENOSCOPY (EGD) WITH PROPOFOL N/A 11/07/2016   Procedure: ESOPHAGOGASTRODUODENOSCOPY (EGD) WITH PROPOFOL;  Surgeon: Jonathon Bellows, MD;  Location: [facility] ENDOSCOPY;  Service: Gastroenterology;  Laterality: N/A;   ESOPHAGOGASTRODUODENOSCOPY (EGD) WITH PROPOFOL N/A 10/15/2019   Procedure: ESOPHAGOGASTRODUODENOSCOPY (EGD) WITH PROPOFOL;  Surgeon: [name], MD;  Location: [facility] ENDOSCOPY;  Service: Endoscopy;  Laterality: N/A;   ESOPHAGOGASTRODUODENOSCOPY (EGD) WITH PROPOFOL N/A 10/28/2019   Procedure: ESOPHAGOGASTRODUODENOSCOPY (EGD) WITH PROPOFOL;  Surgeon: [name], MD;  Location: [facility] ENDOSCOPY;  Service: Endoscopy;  Laterality: N/A;   ESOPHAGOGASTRODUODENOSCOPY (EGD) WITH PROPOFOL N/A 12/02/2019   Procedure: ESOPHAGOGASTRODUODENOSCOPY (EGD) WITH PROPOFOL;  Surgeon: [name], MD;  Location: [facility] ENDOSCOPY;  Service: Endoscopy;  Laterality: N/A;   ESOPHAGOGASTRODUODENOSCOPY (EGD) WITH PROPOFOL N/A 02/18/2020   Procedure: ESOPHAGOGASTRODUODENOSCOPY (EGD) WITH PROPOFOL;  Surgeon: [name], MD;  Location: [facility] ENDOSCOPY;  Service: Endoscopy;  Laterality: N/A;   ESOPHAGOGASTRODUODENOSCOPY (EGD) WITH PROPOFOL N/A 08/02/2021   Procedure: ESOPHAGOGASTRODUODENOSCOPY (EGD) WITH PROPOFOL;  Surgeon: [name], MD;  Location: [facility] ENDOSCOPY;  Service: Endoscopy;  Laterality: N/A;  REQUEST AM   ESOPHAGOGASTRODUODENOSCOPY (EGD) WITH PROPOFOL N/A 10/05/2021   Procedure: ESOPHAGOGASTRODUODENOSCOPY (EGD) WITH  PROPOFOL;  Surgeon: [name], MD;  Location: [facility] ENDOSCOPY;  Service: Endoscopy;  Laterality: N/A;   FRACTURE SURGERY Right 05/28/2019   distal radius fracture   HERNIA REPAIR      left inguinal hernia repair as an infant   KYPHOPLASTY N/A 02/11/2016   Procedure: KYPHOPLASTY  L2,T9;  Surgeon: Hessie Knows, MD;  Location: [facility] ORS;  Service: Orthopedics;  Laterality: N/A;   KYPHOPLASTY N/A 03/15/2016   Procedure: KYPHOPLASTY L3;  Surgeon: Hessie Knows, MD;  Location: [facility] ORS;  Service: Orthopedics;  Laterality: N/A;   OPEN REDUCTION INTERNAL FIXATION (ORIF) DISTAL RADIAL FRACTURE Right 05/28/2019   Procedure: OPEN REDUCTION INTERNAL FIXATION (ORIF) DISTAL RADIAL FRACTURE;  Surgeon: Hessie Knows, MD;  Location: [facility] ORS;  Service: Orthopedics;  Laterality: Right;   skin cancer removed     TONSILLECTOMY       Subjective Assessment -      Subjective Patient reports no falls since last session.  Patient also reports good news from [facility] doctors and he does not have a hernia that requires operation.  Patient is fatigued this date as he had to go to [facility] and came directly here following which is about an hour drive   Pertinent History Charles Stanton is a 89yoM who was prescribed with Parkinson's Disease ~ 10 years.  Pt is followed by neurology, reports good reponse to medications which help control his tremors somewhat. Pt uses a SPC for AMB, also uses a walker for longer distance walking. Pt goes into community almost daily (they like to eat out) which includes lots of SPC [place] eand navigation of 4 stairs at home.    Currently in Pain? No/denies                [facility] PT Assessment - 03/01/22 0001       Assessment   Medical Diagnosis Parkinson's disease    Referring Provider (PT) Dr. Jennings Books    Onset Date/Surgical Date --  Pt diagnosed about 10 years ago   Hand Dominance Right    Prior Therapy some prior PT in home and in outpatient      Precautions   Precautions Fall      Balance Screen   Has the patient fallen in the past 6 months Yes    How many times? 2    Has the patient had a decrease in activity level because of a fear of falling?  No    Is the patient  reluctant to leave their home because of a fear of falling?  No      Home Ecologist residence    Living Arrangements Spouse/significant other      Observation/Other Assessments   Focus on Therapeutic Outcomes (FOTO)  49      Transfers   Transfers Sit to Coffee to Stand 4: Min guard    Five time sit to stand comments  22sec   able ot perform hands-free; author must block both toes due to knee extension > hip extension moment   Number of Reps 10 reps;1 set      Ambulation/Gait   Ambulation/Gait Yes    Ambulation/Gait Assistance 5: Supervision    Ambulation Distance (Feet) 450 Feet    Assistive device Straight cane   typically would use a 4WW for this type of distance at most recent baseline   Gait Pattern --   tends to carry SPC in LUE rather than consistent sequenced weightbearing  through [facility]   Ambulation Surface Level;Indoor    Gait velocity 0.80ms    Gait Comments Pt reports has not walked this far in a long time      Balance   Balance Assessed Yes      Standardized Balance Assessment   Standardized Balance Assessment --   airex pad balance screening  -Airex normal stance eyes open x30sec (increased sway, increased falls anxiety, needs help with safe mount/dismount of pad)  -Airex normal stance eyes closed- critical LOB at 10sec requiring intervention from author to arrest LOB. Immediate increase in tremor and heavy sway.          TREATMENT DATE: 03/03/22  NMR   [facility] PT Assessment - 03/03/22 0001       Standardized Balance Assessment   Standardized Balance Assessment Berg Balance Test      Berg Balance Test   Sit to Stand Able to stand  independently using hands    Standing Unsupported Able to stand safely 2 minutes    Sitting with Back Unsupported but Feet Supported on Floor or Stool Able to sit safely and securely 2 minutes    Stand to Sit Controls descent by using hands    Transfers Able to transfer safely, definite need  of hands    Standing Unsupported with Eyes Closed Able to stand 10 seconds with supervision    Standing Unsupported with Feet Together Able to place feet together independently and stand for 1 minute with supervision    From Standing, Reach Forward with Outstretched Arm Can reach forward >12 cm safely (5")    From Standing Position, Pick up Object from Floor Able to pick up shoe, needs supervision    From Standing Position, Turn to Look Behind Over each Shoulder Looks behind from both sides and weight shifts well    Turn 360 Degrees Able to turn 360 degrees safely but slowly    Standing Unsupported, Alternately Place Feet on Step/Stool Able to stand independently and complete 8 steps >20 seconds    Standing Unsupported, One Foot in Front Able to take small step independently and hold 30 seconds    Standing on One Leg Tries to lift leg/unable to hold 3 seconds but remains standing independently    Total Score 41            Performed above Berg balance test patient showing increased risk of falls as a result  PWR! Moves in seated position, performed 2 x 10 of ea ( PRW! Rock, twist, up and step)  - pt provided with handout for completion of these at home.   TE  Standing heel raise 3 x 10   -STS from elevated surface (chair + balance pad) 1x10, hands free, with 2KG ball in hands to assist with forward weight shift.      PT Education -     Education Details Educated on role of FOTO for outcomes monitoring.    Person(s) Educated Patient    Methods Explanation;Demonstration    Comprehension Verbalized understanding;Returned demonstration;Need further instruction              PT Short Term Goals -       PT SHORT TERM GOAL #1   Title Pt to report regular performance of exercises prescribed for home and a sense of improvements in strength and balance AEB them not being as challenging anymore.    Baseline Will issue on visit 2    Time 4    Period Weeks  Status New    Target  Date 03/15/22      PT SHORT TERM GOAL #2   Title Pt to demonstrate improved power AEB improved 5xSTS hands free in <18sec.    Baseline 03/01/22: 22sec hands free, author provideing foot block bilat    Time 4    Period Weeks    Status New    Target Date 03/29/22               PT Long Term Goals -       PT LONG TERM GOAL #1   Title Pt to improve score on FOTO survey to indicated reduced difficulty with basic mobility required for ADL performance.    Baseline 03/01/22:49    Time 8    Period Weeks    Status New    Target Date 04/26/22      PT LONG TERM GOAL #2   Title Pt to improve tolerance to overground AMB c SPC AEB 6MWT performance >816f.    Baseline 2/27: fatigued after 4551flap performed in four minutes ten seconds.    Time 8    Period Weeks    Status New    Target Date 04/26/22      PT LONG TERM GOAL #3   Title Pt to demonstrate improved leg power AEB 5xSTS from chair <14sec hands free and without need for minGuard assist for safety and without need for foot block.    Baseline 03/01/22: 22sec hands free, feet blocked, minGuard assist    Time 8    Period Weeks    Status New    Target Date 04/26/22      PT LONG TERM GOAL #4   Title Pt to demonstrate improvement in balance performance AEB >6 improvement BERG Balance for balance assessment.    Baseline 41 on 03/03/22   Time 8    Period Weeks    Status New    Target Date 04/26/22              Plan     Clinical Impression Statement Pt presents with good motivation for completion of physical therapy activities.  Patient introduced to Parkinson's wellness recovery specific interventions and instructed to complete these at home as part of home exercise program.  Attempted to do mini best balance test that was too difficult for patient so resorted to Berg balance test.  BeMerrilee Janskyalance test shows impairments in balance indicating increased risk of falls.Pt will continue to benefit from skilled physical therapy  intervention to address impairments, improve QOL, and attain therapy goals.     Personal Factors and Comorbidities Time since onset of injury/illness/exacerbation;Past/Current Experience    Examination-Activity Limitations Locomotion Level;Transfers;Bed Mobility;Reach Overhead;Hygiene/Grooming;Dressing;Toileting;Stairs;Bend    Stability/Clinical Decision Making Evolving/Moderate complexity    Clinical Decision Making Moderate    Rehab Potential Good    PT Frequency 2x / week    PT Duration 8 weeks    PT Treatment/Interventions ADLs/Self Care Home Management;Moist Heat;Gait training;Stair training;Functional mobility training;Therapeutic activities;Therapeutic exercise;Balance training;Neuromuscular re-education;Patient/family education    PT Next Visit Plan More extensive balance assessment measure (consider mini BesTest for validity); develop HEP    PT Home Exercise Plan 03/01/22: discussed finding and elevated sit surface for future STS exercises (something ~18-19 inches high)    Consulted and Agree with Plan of Care Patient             Patient will benefit from skilled therapeutic intervention in order to improve the following deficits and impairments:  Visit Diagnosis: Muscle weakness (generalized)  Unsteadiness on feet  Tremor     Problem List Patient Active Problem List   Diagnosis Date Noted   Acute respiratory failure due to COVID-19 ([place]) 05/28/2019   Alcohol dependence with uncomplicated withdrawal ([place]) 01/23/2018   Alcohol withdrawal ([place]) 01/23/2018   GIB (gastrointestinal bleeding) 12/09/2017   A-fib ([place]) 12/09/2017   Esophageal obstruction 10/10/2016   Esophageal ulceration 10/10/2016   GI bleed 09/20/2016   Low vitamin B12 level 03/18/2016   S/P kyphoplasty 03/18/2016   Basal cell carcinoma 02/08/2016   Atrial fibrillation ([place]) 01/27/2016   Colitis 01/27/2016   HLD (hyperlipidemia) 01/27/2016   Anxiety 01/27/2016   Hypokalemia 01/27/2016   Major  depressive disorder, recurrent episode, moderate ([place]) 04/14/2015   Anxiety, generalized 12/06/2013   Parkinson disease 08/07/2013   Dysphagia 05/12/2013   Esophageal mass 05/12/2013   GERD (gastroesophageal reflux disease) 05/12/2013   Vomiting 05/12/2013   Open bite of lower leg 05/10/2013   Dog bite of lower leg 05/10/2013   Abnormal finding on liver function 03/25/2013   Benign neoplasm 03/25/2013   Dizziness 03/25/2013   Type 2 diabetes mellitus ([place]) 03/25/2013   BP (high blood pressure) 03/25/2013   Awareness of heartbeats 03/25/2013   Pure hypercholesterolemia 03/25/2013   Infective tonsillitis 03/25/2013   Adenomatous polyp 03/25/2013   5:22 PM, 03/03/22   Particia Lather, PT 03/03/2022, 5:22 PM  [place] at [facility] [place], [place], [address] Phone: [phone]   Fax:  [phone]  Name: MOHAMEDALI DOTY MRN: [id number] Date of Birth: 1955-03-19

## 2022-03-08 ENCOUNTER — Ambulatory Visit: Payer: Medicare Other | Attending: Neurology | Admitting: Physical Therapy

## 2022-03-08 ENCOUNTER — Encounter: Payer: Self-pay | Admitting: Physical Therapy

## 2022-03-08 DIAGNOSIS — R2681 Unsteadiness on feet: Secondary | ICD-10-CM | POA: Diagnosis present

## 2022-03-08 DIAGNOSIS — R251 Tremor, unspecified: Secondary | ICD-10-CM | POA: Insufficient documentation

## 2022-03-08 DIAGNOSIS — M6281 Muscle weakness (generalized): Secondary | ICD-10-CM | POA: Diagnosis present

## 2022-03-08 NOTE — Therapy (Signed)
[place] at [facility] [place], [place], [address] Phone: [phone]   Fax:  [phone]  Outpatient Physical Therapy Treatment  Patient Details  Name: Charles Stanton MRN: [id number] Date of Birth: Jan 09, 1955 Referring Provider (PT): Dr. Jennings Books   Encounter Date: 03/08/2022   PT End of Session - 03/08/22 1346     Visit Number 3    Number of Visits 16    Date for PT Re-Evaluation 04/26/22    Authorization Type Medicare A & B primary; federal employee secondary    Authorization Time Period 03/01/22-04/26/22    Progress Note Due on Visit 10    Equipment Utilized During Treatment Gait belt    Activity Tolerance Patient tolerated treatment well;No increased pain    Behavior During Therapy [facility] for tasks assessed/performed               Past Medical History:  Diagnosis Date   Abnormal liver function    Anemia    Anxiety    Atrial fibrillation ([place])    Cancer ([place]) 2017   skin   Colonic mass    Depression    Dizziness    Dry cough    History of colon polyps    Hypercholesteremia    Hyperlipemia    Hypertension    Palpitations    Parkinson's disease    Parkinson's disease    PVD (peripheral vascular disease) ([place])    Tonsillitis 03/25/2013   Tremors of nervous system     Past Surgical History:  Procedure Laterality Date   BACK SURGERY     Kyphoplasty   March 2018   COLONOSCOPY     COLONOSCOPY WITH PROPOFOL N/A 12/12/2017   Procedure: COLONOSCOPY WITH PROPOFOL;  Surgeon: Jonathon Bellows, MD;  Location: [facility] ENDOSCOPY;  Service: Gastroenterology;  Laterality: N/A;   ESOPHAGOGASTRODUODENOSCOPY N/A 12/11/2017   Procedure: ESOPHAGOGASTRODUODENOSCOPY (EGD);  Surgeon: Jonathon Bellows, MD;  Location: [facility] ENDOSCOPY;  Service: Gastroenterology;  Laterality: N/A;   ESOPHAGOGASTRODUODENOSCOPY N/A 10/08/2021   Procedure: ESOPHAGOGASTRODUODENOSCOPY (EGD);  Surgeon: [name], MD;  Location: [facility] ENDOSCOPY;   Service: Endoscopy;  Laterality: N/A;   ESOPHAGOGASTRODUODENOSCOPY (EGD) WITH PROPOFOL N/A 09/21/2016   Procedure: ESOPHAGOGASTRODUODENOSCOPY (EGD) WITH PROPOFOL;  Surgeon: Jonathon Bellows, MD;  Location: [facility] ENDOSCOPY;  Service: Gastroenterology;  Laterality: N/A;   ESOPHAGOGASTRODUODENOSCOPY (EGD) WITH PROPOFOL N/A 11/07/2016   Procedure: ESOPHAGOGASTRODUODENOSCOPY (EGD) WITH PROPOFOL;  Surgeon: Jonathon Bellows, MD;  Location: [facility] ENDOSCOPY;  Service: Gastroenterology;  Laterality: N/A;   ESOPHAGOGASTRODUODENOSCOPY (EGD) WITH PROPOFOL N/A 10/15/2019   Procedure: ESOPHAGOGASTRODUODENOSCOPY (EGD) WITH PROPOFOL;  Surgeon: [name], MD;  Location: [facility] ENDOSCOPY;  Service: Endoscopy;  Laterality: N/A;   ESOPHAGOGASTRODUODENOSCOPY (EGD) WITH PROPOFOL N/A 10/28/2019   Procedure: ESOPHAGOGASTRODUODENOSCOPY (EGD) WITH PROPOFOL;  Surgeon: [name], MD;  Location: [facility] ENDOSCOPY;  Service: Endoscopy;  Laterality: N/A;   ESOPHAGOGASTRODUODENOSCOPY (EGD) WITH PROPOFOL N/A 12/02/2019   Procedure: ESOPHAGOGASTRODUODENOSCOPY (EGD) WITH PROPOFOL;  Surgeon: [name], MD;  Location: [facility] ENDOSCOPY;  Service: Endoscopy;  Laterality: N/A;   ESOPHAGOGASTRODUODENOSCOPY (EGD) WITH PROPOFOL N/A 02/18/2020   Procedure: ESOPHAGOGASTRODUODENOSCOPY (EGD) WITH PROPOFOL;  Surgeon: [name], MD;  Location: [facility] ENDOSCOPY;  Service: Endoscopy;  Laterality: N/A;   ESOPHAGOGASTRODUODENOSCOPY (EGD) WITH PROPOFOL N/A 08/02/2021   Procedure: ESOPHAGOGASTRODUODENOSCOPY (EGD) WITH PROPOFOL;  Surgeon: [name], MD;  Location: [facility] ENDOSCOPY;  Service: Endoscopy;  Laterality: N/A;  REQUEST AM   ESOPHAGOGASTRODUODENOSCOPY (EGD) WITH PROPOFOL N/A 10/05/2021   Procedure: ESOPHAGOGASTRODUODENOSCOPY (EGD)  WITH PROPOFOL;  Surgeon: [name], MD;  Location: [facility] ENDOSCOPY;  Service: Endoscopy;  Laterality: N/A;   FRACTURE SURGERY Right 05/28/2019   distal radius fracture   HERNIA REPAIR      left inguinal hernia repair as an infant   KYPHOPLASTY N/A 02/11/2016   Procedure: KYPHOPLASTY  L2,T9;  Surgeon: Hessie Knows, MD;  Location: [facility] ORS;  Service: Orthopedics;  Laterality: N/A;   KYPHOPLASTY N/A 03/15/2016   Procedure: KYPHOPLASTY L3;  Surgeon: Hessie Knows, MD;  Location: [facility] ORS;  Service: Orthopedics;  Laterality: N/A;   OPEN REDUCTION INTERNAL FIXATION (ORIF) DISTAL RADIAL FRACTURE Right 05/28/2019   Procedure: OPEN REDUCTION INTERNAL FIXATION (ORIF) DISTAL RADIAL FRACTURE;  Surgeon: Hessie Knows, MD;  Location: [facility] ORS;  Service: Orthopedics;  Laterality: Right;   skin cancer removed     TONSILLECTOMY       Subjective Assessment -      Subjective Patient reports no falls since last session and he is excited for [employer] and [employer] this weekend   Pertinent History Charles Stanton is a 38yoM who was prescribed with Parkinson's Disease ~ 10 years.  Pt is followed by neurology, reports good reponse to medications which help control his tremors somewhat. Pt uses a SPC for AMB, also uses a walker for longer distance walking. Pt goes into community almost daily (they like to eat out) which includes lots of SPC [place] eand navigation of 4 stairs at home.    Currently in Pain? No/denies                [facility] PT Assessment - 03/01/22 0001       Assessment   Medical Diagnosis Parkinson's disease    Referring Provider (PT) Dr. Jennings Books    Onset Date/Surgical Date --  Pt diagnosed about 10 years ago   Hand Dominance Right    Prior Therapy some prior PT in home and in outpatient      Precautions   Precautions Fall      Balance Screen   Has the patient fallen in the past 6 months Yes    How many times? 2    Has the patient had a decrease in activity level because of a fear of falling?  No    Is the patient reluctant to leave their home because of a fear of falling?  No      Home Ecologist residence    Living Arrangements  Spouse/significant other      Observation/Other Assessments   Focus on Therapeutic Outcomes (FOTO)  49      Transfers   Transfers Sit to [place] to Stand 4: Min guard    Five time sit to stand comments  22sec   able ot perform hands-free; author must block both toes due to knee extension > hip extension moment   Number of Reps 10 reps;1 set      Ambulation/Gait   Ambulation/Gait Yes    Ambulation/Gait Assistance 5: Supervision    Ambulation Distance (Feet) 450 Feet    Assistive device Straight cane   typically would use a 4WW for this type of distance at most recent baseline   Gait Pattern --   tends to carry SPC in LUE rather than consistent sequenced weightbearing through [facility]   Ambulation Surface Level;Indoor    Gait velocity 0.72ms    Gait Comments Pt reports has not walked this far in a long time  Balance   Balance Assessed Yes      Standardized Balance Assessment   Standardized Balance Assessment --   airex pad balance screening  -Airex normal stance eyes open x30sec (increased sway, increased falls anxiety, needs help with safe mount/dismount of pad)  -Airex normal stance eyes closed- critical LOB at 10sec requiring intervention from author to arrest LOB. Immediate increase in tremor and heavy sway.          TREATMENT DATE: 03/08/22 TE:   Ambulation x 350 feet with rollator, good balance and use of rollator.   NMR Seated PWR! For HEP review, excellent recall and form in completion   Standing PWR! Moves modified with 1 UE on chair for stability x 10 ea of Up, rock and step  Rest - performed on opposite side   -cues for correct performance or PWR! up  Pt educated regarding [place] PWR! Exercise classes and other community offerings.   -STS from elevated surface (chair + balance pad) 1x10, hands free, with 2KG ball in hands to assist with forward weight shift.   -fatigue noted in Pt Les, performed a round of seated therex following as a result and t  prevent excess fatigue when leaving clinic   Rows with RTB 2 x 10  LAQ 3# AW 2 x 10 reps  Heel toe raises 2 x 15 reps      PT Education -     Education Details Pt educated regarding [place] PWR! Exercise classes and other community offerings.    Person(s) Educated Patient    Methods Explanation;Demonstration ; handout   Comprehension Verbalized understanding;Returned demonstration;Need further instruction              PT Short Term Goals -       PT SHORT TERM GOAL #1   Title Pt to report regular performance of exercises prescribed for home and a sense of improvements in strength and balance AEB them not being as challenging anymore.    Baseline Will issue on visit 2    Time 4    Period Weeks    Status New    Target Date 03/15/22      PT SHORT TERM GOAL #2   Title Pt to demonstrate improved power AEB improved 5xSTS hands free in <18sec.    Baseline 03/01/22: 22sec hands free, author provideing foot block bilat    Time 4    Period Weeks    Status New    Target Date 03/29/22               PT Long Term Goals -       PT LONG TERM GOAL #1   Title Pt to improve score on FOTO survey to indicated reduced difficulty with basic mobility required for ADL performance.    Baseline 03/01/22:49    Time 8    Period Weeks    Status New    Target Date 04/26/22      PT LONG TERM GOAL #2   Title Pt to improve tolerance to overground AMB c SPC AEB 6MWT performance >872f.    Baseline 2/27: fatigued after 4580flap performed in four minutes ten seconds.    Time 8    Period Weeks    Status New    Target Date 04/26/22      PT LONG TERM GOAL #3   Title Pt to demonstrate improved leg power AEB 5xSTS from chair <14sec hands free and without need for minGuard assist for safety and  without need for foot block.    Baseline 03/01/22: 22sec hands free, feet blocked, minGuard assist    Time 8    Period Weeks    Status New    Target Date 04/26/22      PT LONG TERM GOAL #4    Title Pt to demonstrate improvement in balance performance AEB >6 improvement BERG Balance for balance assessment.    Baseline 41 on 03/03/22   Time 8    Period Weeks    Status New    Target Date 04/26/22              Plan     Clinical Impression Statement Pt presents with good motivation for completion of physical therapy activities.  Patient continues with Parkinson's wellness recovery specific interventions and instructed to continue to complete these at home as part of home exercise program and to [facility] into community based programs in [place]. Pt did fatigue quickly with modified standing PWR! Exercises. Pt ambulated with good form with rollator and is comfortable using this in the community setting although he ambulates with a cane most of the time. Pt will continue to benefit from skilled physical therapy intervention to address impairments, improve QOL, and attain therapy goals.     Personal Factors and Comorbidities Time since onset of injury/illness/exacerbation;Past/Current Experience    Examination-Activity Limitations Locomotion Level;Transfers;Bed Mobility;Reach Overhead;Hygiene/Grooming;Dressing;Toileting;Stairs;Bend    Stability/Clinical Decision Making Evolving/Moderate complexity    Clinical Decision Making Moderate    Rehab Potential Good    PT Frequency 2x / week    PT Duration 8 weeks    PT Treatment/Interventions ADLs/Self Care Home Management;Moist Heat;Gait training;Stair training;Functional mobility training;Therapeutic activities;Therapeutic exercise;Balance training;Neuromuscular re-education;Patient/family education    PT Next Visit Plan More extensive balance assessment measure (consider mini BesTest for validity); develop HEP    PT Home Exercise Plan 03/01/22: discussed finding and elevated sit surface for future STS exercises (something ~18-19 inches high)    Consulted and Agree with Plan of Care Patient             Patient will benefit from skilled  therapeutic intervention in order to improve the following deficits and impairments:     Visit Diagnosis: Muscle weakness (generalized)  Unsteadiness on feet     Problem List Patient Active Problem List   Diagnosis Date Noted   Acute respiratory failure due to COVID-19 ([place]) 05/28/2019   Alcohol dependence with uncomplicated withdrawal ([place]) 01/23/2018   Alcohol withdrawal ([place]) 01/23/2018   GIB (gastrointestinal bleeding) 12/09/2017   A-fib ([place]) 12/09/2017   Esophageal obstruction 10/10/2016   Esophageal ulceration 10/10/2016   GI bleed 09/20/2016   Low vitamin B12 level 03/18/2016   S/P kyphoplasty 03/18/2016   Basal cell carcinoma 02/08/2016   Atrial fibrillation ([place]) 01/27/2016   Colitis 01/27/2016   HLD (hyperlipidemia) 01/27/2016   Anxiety 01/27/2016   Hypokalemia 01/27/2016   Major depressive disorder, recurrent episode, moderate ([place]) 04/14/2015   Anxiety, generalized 12/06/2013   Parkinson disease 08/07/2013   Dysphagia 05/12/2013   Esophageal mass 05/12/2013   GERD (gastroesophageal reflux disease) 05/12/2013   Vomiting 05/12/2013   Open bite of lower leg 05/10/2013   Dog bite of lower leg 05/10/2013   Abnormal finding on liver function 03/25/2013   Benign neoplasm 03/25/2013   Dizziness 03/25/2013   Type 2 diabetes mellitus ([place]) 03/25/2013   BP (high blood pressure) 03/25/2013   Awareness of heartbeats 03/25/2013   Pure hypercholesterolemia 03/25/2013   Infective tonsillitis 03/25/2013   Adenomatous polyp  03/25/2013   1:49 PM, 03/08/22   Particia Lather, PT 03/08/2022, 1:49 PM  [place] at [facility] [place], [place], [address] Phone: [phone]   Fax:  [phone]  Name: LUKA NABARRO MRN: [id number] Date of Birth: [birth year]

## 2022-03-10 ENCOUNTER — Ambulatory Visit: Payer: Medicare Other

## 2022-03-10 DIAGNOSIS — M6281 Muscle weakness (generalized): Secondary | ICD-10-CM

## 2022-03-10 DIAGNOSIS — R2681 Unsteadiness on feet: Secondary | ICD-10-CM

## 2022-03-10 DIAGNOSIS — R251 Tremor, unspecified: Secondary | ICD-10-CM

## 2022-03-10 NOTE — Therapy (Signed)
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) 2017   skin   Colonic mass    Depression    Dizziness    Dry cough    History of colon polyps    Hypercholesteremia    Hyperlipemia    Hypertension    Palpitations    Parkinson's disease    Parkinson's disease    PVD (peripheral vascular disease) ([place])    Tonsillitis 03/25/2013   Tremors of nervous system     Past Surgical History:  Procedure Laterality Date   BACK SURGERY     Kyphoplasty   March 2018   COLONOSCOPY     COLONOSCOPY WITH PROPOFOL N/A 12/12/2017   Procedure: COLONOSCOPY WITH PROPOFOL;  Surgeon: Charles Bellows, MD;  Location: [facility] ENDOSCOPY;  Service: Gastroenterology;  Laterality: N/A;   ESOPHAGOGASTRODUODENOSCOPY N/A 12/11/2017   Procedure: ESOPHAGOGASTRODUODENOSCOPY (EGD);  Surgeon: Charles Bellows, MD;  Location: [facility] ENDOSCOPY;  Service: Gastroenterology;  Laterality: N/A;   ESOPHAGOGASTRODUODENOSCOPY N/A 10/08/2021   Procedure: ESOPHAGOGASTRODUODENOSCOPY (EGD);  Surgeon: Charles Rubenstein, MD;  Location: [facility] ENDOSCOPY;  Service: Endoscopy;  Laterality: N/A;   ESOPHAGOGASTRODUODENOSCOPY (EGD) WITH PROPOFOL N/A 09/21/2016   Procedure: ESOPHAGOGASTRODUODENOSCOPY (EGD) WITH PROPOFOL;  Surgeon: Charles Bellows, MD;  Location: [facility] ENDOSCOPY;  Service: Gastroenterology;  Laterality: N/A;   ESOPHAGOGASTRODUODENOSCOPY (EGD) WITH PROPOFOL N/A 11/07/2016   Procedure: ESOPHAGOGASTRODUODENOSCOPY (EGD) WITH PROPOFOL;  Surgeon: Charles Bellows, MD;  Location: [facility] ENDOSCOPY;  Service: Gastroenterology;  Laterality: N/A;   ESOPHAGOGASTRODUODENOSCOPY (EGD) WITH PROPOFOL N/A 10/15/2019   Procedure: ESOPHAGOGASTRODUODENOSCOPY (EGD) WITH PROPOFOL;  Surgeon: Charles Rubenstein, MD;  Location: [facility] ENDOSCOPY;  Service: Endoscopy;  Laterality: N/A;   ESOPHAGOGASTRODUODENOSCOPY (EGD) WITH PROPOFOL N/A 10/28/2019   Procedure: ESOPHAGOGASTRODUODENOSCOPY (EGD) WITH PROPOFOL;  Surgeon: Charles Rubenstein, MD;  Location: [facility] ENDOSCOPY;  Service: Endoscopy;  Laterality: N/A;   ESOPHAGOGASTRODUODENOSCOPY (EGD) WITH PROPOFOL N/A 12/02/2019   Procedure: ESOPHAGOGASTRODUODENOSCOPY (EGD) WITH PROPOFOL;  Surgeon: Charles Rubenstein, MD;  Location: [facility] ENDOSCOPY;  Service: Endoscopy;  Laterality: N/A;   ESOPHAGOGASTRODUODENOSCOPY (EGD) WITH PROPOFOL N/A 02/18/2020   Procedure: ESOPHAGOGASTRODUODENOSCOPY (EGD) WITH PROPOFOL;  Surgeon: Charles Rubenstein, MD;  Location: [facility] ENDOSCOPY;  Service: Endoscopy;  Laterality: N/A;   ESOPHAGOGASTRODUODENOSCOPY (EGD) WITH PROPOFOL N/A 08/02/2021   Procedure: ESOPHAGOGASTRODUODENOSCOPY (EGD) WITH PROPOFOL;  Surgeon: Charles Rubenstein, MD;  Location: [facility] ENDOSCOPY;  Service: Endoscopy;  Laterality: N/A;  REQUEST  AM   ESOPHAGOGASTRODUODENOSCOPY (EGD) WITH PROPOFOL N/A 10/05/2021   Procedure: ESOPHAGOGASTRODUODENOSCOPY (EGD) WITH PROPOFOL;  Surgeon: Charles Rubenstein, MD;  Location: [facility] ENDOSCOPY;  Service: Endoscopy;  Laterality: N/A;   FRACTURE SURGERY Right 05/28/2019   distal  radius fracture   HERNIA REPAIR     left inguinal hernia repair as an infant   KYPHOPLASTY N/A 02/11/2016   Procedure: KYPHOPLASTY  L2,T9;  Surgeon: Charles Knows, MD;  Location: [facility] ORS;  Service: Orthopedics;  Laterality: N/A;   KYPHOPLASTY N/A 03/15/2016   Procedure: KYPHOPLASTY L3;  Surgeon: Charles Knows, MD;  Location: [facility] ORS;  Service: Orthopedics;  Laterality: N/A;   OPEN REDUCTION INTERNAL FIXATION (ORIF) DISTAL RADIAL FRACTURE Right 05/28/2019   Procedure: OPEN REDUCTION INTERNAL FIXATION (ORIF) DISTAL RADIAL FRACTURE;  Surgeon: Charles Knows, MD;  Location: [facility] ORS;  Service: Orthopedics;  Laterality: Right;   skin cancer removed     TONSILLECTOMY       Subjective Assessment -      Subjective Patient reports no falls since last session and he is excited for Charles Stanton and [employer] this weekend. Hey says Charles Stanton wore him out last time.    Pertinent History Kyler Stanton is a 44yoM who was prescribed with Parkinson's Disease ~ 10 years.  Pt is followed by neurology, reports good reponse to medications which help control his tremors somewhat. Pt uses a SPC for AMB, also uses a walker for longer distance walking. Pt goes into community almost daily (they like to eat out) which includes lots of SPC [place] eand navigation of 4 stairs at home.    Currently in Pain? No/denies                 TREATMENT DATE: 03/10/22  -overground AMB c rollator 469f supervision to modI  -in // bars c 3lb AW: high knee marching x20, side stepping x369fbilat, STS from chairex x9 (2 sets each) -4081fetroAMB c 3lb AW bilat, hands prn  -seated downward cable row 1x10 @ 17.5# -overground [id number] 4ww c 5lb AW bilat  -seated downward cable row 1x10 @ 17.5#    -STS from elevated surface (chair + balance pad) 1x10, hands free, with 2KG ball in hands to assist with forward weight shift.   -fatigue noted in Pt Les, performed a round of seated therex following as a result and t prevent excess  fatigue when leaving clinic        PT Education -     Education Details Pt educated regarding gree[employer]! Exercise classes and other community offerings.    Person(s) Educated Patient    Methods Explanation;Demonstration ; handout   Comprehension Verbalized understanding;Returned demonstration;Need further instruction              PT Short Term Goals -       PT SHORT TERM GOAL #1   Title Pt to report regular performance of exercises prescribed for home and a sense of improvements in strength and balance AEB them not being as challenging anymore.    Baseline Will issue on visit 2    Time 4    Period Weeks    Status New    Target Date 03/15/22      PT SHORT TERM GOAL #2   Title Pt to demonstrate improved power AEB improved 5xSTS hands free in <18sec.    Baseline 03/01/22: 22sec hands free, author provideing foot block bilat    Time 4    Period Weeks  Status New    Target Date 03/29/22               PT Long Term Goals -       PT LONG TERM GOAL #1   Title Pt to improve score on FOTO survey to indicated reduced difficulty with basic mobility required for ADL performance.    Baseline 03/01/22:49    Time 8    Period Weeks    Status New    Target Date 04/26/22      PT LONG TERM GOAL #2   Title Pt to improve tolerance to overground AMB c SPC AEB 6MWT performance >833f.    Baseline 2/27: fatigued after 458flap performed in four minutes ten seconds.    Time 8    Period Weeks    Status New    Target Date 04/26/22      PT LONG TERM GOAL #3   Title Pt to demonstrate improved leg power AEB 5xSTS from chair <14sec hands free and without need for minGuard assist for safety and without need for foot block.    Baseline 03/01/22: 22sec hands free, feet blocked, minGuard assist    Time 8    Period Weeks    Status New    Target Date 04/26/22      PT LONG TERM GOAL #4   Title Pt to demonstrate improvement in balance performance AEB >6 improvement BERG Balance  for balance assessment.    Baseline 41 on 03/03/22   Time 8    Period Weeks    Status New    Target Date 04/26/22              Plan     Clinical Impression Statement Heavy emphasis on gross motor strengthening of legs. Balance will always be easily incorporated with gross movements given extent deficits and need to improve safety. Pt remains motivated to work hard, but is easily worn out, recovery intervals remain vital. Will continue to advance interventions as appropriate to maximize achievement of goals.     Personal Factors and Comorbidities Time since onset of injury/illness/exacerbation;Past/Current Experience    Examination-Activity Limitations Locomotion Level;Transfers;Bed Mobility;Reach Overhead;Hygiene/Grooming;Dressing;Toileting;Stairs;Bend    Stability/Clinical Decision Making Evolving/Moderate complexity    Clinical Decision Making Moderate    Rehab Potential Good    PT Frequency 2x / week    PT Duration 8 weeks    PT Treatment/Interventions ADLs/Self Care Home Management;Moist Heat;Gait training;Stair training;Functional mobility training;Therapeutic activities;Therapeutic exercise;Balance training;Neuromuscular re-education;Patient/family education    PT Next Visit Plan More extensive balance assessment measure (consider mini BesTest for validity); develop HEP    PT Home Exercise Plan 03/01/22: discussed finding and elevated sit surface for future STS exercises (something ~18-19 inches high)    Consulted and Agree with Plan of Care Patient             Patient will benefit from skilled therapeutic intervention in order to improve the following deficits and impairments:     Visit Diagnosis: Muscle weakness (generalized)  Unsteadiness on feet  Tremor     Problem List Patient Active Problem List   Diagnosis Date Noted   Acute respiratory failure due to COVID-19 ([facility]05/25/2021   Alcohol dependence with uncomplicated withdrawal ([facility]01/21/2020   Alcohol  withdrawal ([facility]01/21/2020   GIB (gastrointestinal bleeding) 12/09/2017   A-fib ([facility]12/07/2017   Esophageal obstruction 10/10/2016   Esophageal ulceration 10/10/2016   GI bleed 09/20/2016   Low vitamin B12 level 03/18/2016   S/P kyphoplasty  03/18/2016   Basal cell carcinoma 02/08/2016   Atrial fibrillation ([place]) 01/27/2016   Colitis 01/27/2016   HLD (hyperlipidemia) 01/27/2016   Anxiety 01/27/2016   Hypokalemia 01/27/2016   Major depressive disorder, recurrent episode, moderate ([place]) 04/14/2015   Anxiety, generalized 12/06/2013   Parkinson disease 08/07/2013   Dysphagia 05/12/2013   Esophageal mass 05/12/2013   GERD (gastroesophageal reflux disease) 05/12/2013   Vomiting 05/12/2013   Open bite of lower leg 05/10/2013   Dog bite of lower leg 05/10/2013   Abnormal finding on liver function 03/25/2013   Benign neoplasm 03/25/2013   Dizziness 03/25/2013   Type 2 diabetes mellitus ([place]) 03/25/2013   BP (high blood pressure) 03/25/2013   Awareness of heartbeats 03/25/2013   Pure hypercholesterolemia 03/25/2013   Infective tonsillitis 03/25/2013   Adenomatous polyp 03/25/2013   12:06 PM, 03/10/22   12:06 PM, 03/10/22 Etta Grandchild, PT, DPT Physical Therapist - [facility]  Outpatient Physical Therapy- [place] [phone]     [place], PT 03/10/2022, 12:06 PM  [facility] at [facility] [place], [place], [address] Phone: [phone]   Fax:  [phone]  Name: Charles Stanton MRN: [id number] Date of Birth: 02-Sep-1955

## 2022-03-15 ENCOUNTER — Ambulatory Visit: Payer: Medicare Other | Admitting: Physical Therapy

## 2022-03-15 DIAGNOSIS — M6281 Muscle weakness (generalized): Secondary | ICD-10-CM | POA: Diagnosis not present

## 2022-03-15 DIAGNOSIS — R2681 Unsteadiness on feet: Secondary | ICD-10-CM

## 2022-03-15 NOTE — Therapy (Signed)
[place] at [facility] [place], [place], [address] Phone: [phone]   Fax:  [phone]  Outpatient Physical Therapy Treatment  Patient Details  Name: Charles Stanton MRN: [id number] Date of Birth: 1955/05/15 Referring Provider (PT): Dr. 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; federal employee secondary    Authorization Time Period 03/01/22-04/26/22    Progress Note Due on Visit 10    PT Start Time 1433    PT Stop Time 1514    PT Time Calculation (min) 41 min    Equipment Utilized During Treatment Gait belt    Activity Tolerance Patient tolerated treatment well;No increased pain    Behavior During Therapy [facility] for tasks assessed/performed                Past Medical History:  Diagnosis Date   Abnormal liver function    Anemia    Anxiety    Atrial fibrillation ([place])    Cancer ([place]) 2017   skin   Colonic mass    Depression    Dizziness    Dry cough    History of colon polyps    Hypercholesteremia    Hyperlipemia    Hypertension    Palpitations    Parkinson's disease    Parkinson's disease    PVD (peripheral vascular disease) ([place])    Tonsillitis 03/25/2013   Tremors of nervous system     Past Surgical History:  Procedure Laterality Date   BACK SURGERY     Kyphoplasty   March 2018   COLONOSCOPY     COLONOSCOPY WITH PROPOFOL N/A 12/12/2017   Procedure: COLONOSCOPY WITH PROPOFOL;  Surgeon: Jonathon Bellows, MD;  Location: [facility] ENDOSCOPY;  Service: Gastroenterology;  Laterality: N/A;   ESOPHAGOGASTRODUODENOSCOPY N/A 12/11/2017   Procedure: ESOPHAGOGASTRODUODENOSCOPY (EGD);  Surgeon: Jonathon Bellows, MD;  Location: [facility] ENDOSCOPY;  Service: Gastroenterology;  Laterality: N/A;   ESOPHAGOGASTRODUODENOSCOPY N/A 10/08/2021    Procedure: ESOPHAGOGASTRODUODENOSCOPY (EGD);  Surgeon: [name], MD;  Location: [facility] ENDOSCOPY;  Service: Endoscopy;  Laterality: N/A;   ESOPHAGOGASTRODUODENOSCOPY (EGD) WITH PROPOFOL N/A 09/21/2016   Procedure: ESOPHAGOGASTRODUODENOSCOPY (EGD) WITH PROPOFOL;  Surgeon: Jonathon Bellows, MD;  Location: [facility] ENDOSCOPY;  Service: Gastroenterology;  Laterality: N/A;   ESOPHAGOGASTRODUODENOSCOPY (EGD) WITH PROPOFOL N/A 11/07/2016   Procedure: ESOPHAGOGASTRODUODENOSCOPY (EGD) WITH PROPOFOL;  Surgeon: Jonathon Bellows, MD;  Location: [facility] ENDOSCOPY;  Service: Gastroenterology;  Laterality: N/A;   ESOPHAGOGASTRODUODENOSCOPY (EGD) WITH PROPOFOL N/A 10/15/2019   Procedure: ESOPHAGOGASTRODUODENOSCOPY (EGD) WITH PROPOFOL;  Surgeon: [name], MD;  Location: [facility] ENDOSCOPY;  Service: Endoscopy;  Laterality: N/A;   ESOPHAGOGASTRODUODENOSCOPY (EGD) WITH PROPOFOL N/A 10/28/2019   Procedure: ESOPHAGOGASTRODUODENOSCOPY (EGD) WITH PROPOFOL;  Surgeon: [name], MD;  Location: [facility] ENDOSCOPY;  Service: Endoscopy;  Laterality: N/A;   ESOPHAGOGASTRODUODENOSCOPY (EGD) WITH PROPOFOL N/A 12/02/2019   Procedure: ESOPHAGOGASTRODUODENOSCOPY (EGD) WITH PROPOFOL;  Surgeon: [name], MD;  Location: [facility] ENDOSCOPY;  Service: Endoscopy;  Laterality: N/A;   ESOPHAGOGASTRODUODENOSCOPY (EGD) WITH PROPOFOL N/A 02/18/2020   Procedure: ESOPHAGOGASTRODUODENOSCOPY (EGD) WITH PROPOFOL;  Surgeon: [name], MD;  Location: [facility] ENDOSCOPY;  Service: Endoscopy;  Laterality: N/A;   ESOPHAGOGASTRODUODENOSCOPY (EGD) WITH PROPOFOL N/A 08/02/2021   Procedure: ESOPHAGOGASTRODUODENOSCOPY (EGD) WITH PROPOFOL;  Surgeon: [place] Prophet,  Hilton Cork, MD;  Location: [facility] ENDOSCOPY;  Service: Endoscopy;  Laterality: N/A;  REQUEST AM   ESOPHAGOGASTRODUODENOSCOPY (EGD) WITH PROPOFOL N/A 10/05/2021   Procedure: ESOPHAGOGASTRODUODENOSCOPY (EGD) WITH PROPOFOL;  Surgeon: [name], MD;  Location: [facility] ENDOSCOPY;  Service:  Endoscopy;  Laterality: N/A;   FRACTURE SURGERY Right 05/28/2019   distal radius fracture   HERNIA REPAIR     left inguinal hernia repair as an infant   KYPHOPLASTY N/A 02/11/2016   Procedure: KYPHOPLASTY  L2,T9;  Surgeon: [place] Knows, MD;  Location: [facility] ORS;  Service: Orthopedics;  Laterality: N/A;   KYPHOPLASTY N/A 03/15/2016   Procedure: KYPHOPLASTY L3;  Surgeon: [place]ie Knows, MD;  Location: [facility] ORS;  Service: Orthopedics;  Laterality: N/A;   OPEN REDUCTION INTERNAL FIXATION (ORIF) DISTAL RADIAL FRACTURE Right 05/28/2019   Procedure: OPEN REDUCTION INTERNAL FIXATION (ORIF) DISTAL RADIAL FRACTURE;  Surgeon: [place] Knows, MD;  Location: [facility] ORS;  Service: Orthopedics;  Laterality: Right;   skin cancer removed     TONSILLECTOMY       Subjective Assessment -      Subjective Pt reports one fall since last session. He said he was getting out of bed when this happened. He has minor abrasive injuries to his arm but states other than that he is just sore with no pain.    Pertinent History [name] is a 21yoM who was prescribed with Parkinson's Disease ~ 10 years.  Pt is followed by neurology, reports good reponse to medications which help control his tremors somewhat. Pt uses a SPC for AMB, also uses a walker for longer distance walking. Pt goes into community almost daily (they like to eat out) which includes lots of SPC [place] eand navigation of 4 stairs at home.    Currently in Pain? No/denies                 TREATMENT DATE: 03/16/22  Standing PWR! Moves with 1 UE assist   Supine PWR! Moves working on bed mobility, core strength and thoracic mobility  2 x 10 ea exercise  Seated FLOW between PWR! Exercises. UP->Rock -> Twist -> Step x 4 rounds   Sit to stand with 3 kg med ball in order to improve forward weight shift.  Patient also provided with cues to perform similar to power up patient still has difficulty with forward weight shift although is able to complete this  significantly increased difficulty of exercise.  Will consider more modifications and cues neck session for improvement in form  Hip abduction into Thera-Band with red Thera-Band x 10 repetitions x 5 seconds\  Seated long arc quad x 10 on each lower extremity with 4 pound ankle weights.        PT Education -     Education Details Pt educated regarding [employer] PWR! Exercise classes and other community offerings.    Person(s) Educated Patient    Methods Explanation;Demonstration ; handout   Comprehension Verbalized understanding;Returned demonstration;Need further instruction              PT Short Term Goals -       PT SHORT TERM GOAL #1   Title Pt to report regular performance of exercises prescribed for home and a sense of improvements in strength and balance AEB them not being as challenging anymore.    Baseline Will issue on visit 2    Time 4    Period Weeks    Status New    Target Date 03/15/22  PT SHORT TERM GOAL #2   Title Pt to demonstrate improved power AEB improved 5xSTS hands free in <18sec.    Baseline 03/01/22: 22sec hands free, author provideing foot block bilat    Time 4    Period Weeks    Status New    Target Date 03/29/22               PT Long Term Goals -       PT LONG TERM GOAL #1   Title Pt to improve score on FOTO survey to indicated reduced difficulty with basic mobility required for ADL performance.    Baseline 03/01/22:49    Time 8    Period Weeks    Status New    Target Date 04/26/22      PT LONG TERM GOAL #2   Title Pt to improve tolerance to overground AMB c SPC AEB 6MWT performance >857f.    Baseline 2/27: fatigued after 4559flap performed in four minutes ten seconds.    Time 8    Period Weeks    Status New    Target Date 04/26/22      PT LONG TERM GOAL #3   Title Pt to demonstrate improved leg power AEB 5xSTS from chair <14sec hands free and without need for minGuard assist for safety and without need for foot  block.    Baseline 03/01/22: 22sec hands free, feet blocked, minGuard assist    Time 8    Period Weeks    Status New    Target Date 04/26/22      PT LONG TERM GOAL #4   Title Pt to demonstrate improvement in balance performance AEB >6 improvement BERG Balance for balance assessment.    Baseline 41 on 03/03/22   Time 8    Period Weeks    Status New    Target Date 04/26/22              Plan     Clinical Impression Statement Continued with Parkinson's wellness recovery specific exercises to improve his mobility, balance, weight shifting, and strength.  Patient continues to progress well with these although he does require cueing for new exercises he does catch on very quickly.  Patient still having significant difficulty with sit to stand particularly with the weight shift portion of it requiring significantly more effort and is needed if patient was using proper form.  Will attempt to combine seated power up with standing power up neck session to sit to stand and to improve his ability to complete with good form.  Patient did have 1 fall since last session when he was trying to get up from bed and take a few steps.  Will integrate exercises to improve with this as patient progresses.Pt will continue to benefit from skilled physical therapy intervention to address impairments, improve QOL, and attain therapy goals.     Personal Factors and Comorbidities Time since onset of injury/illness/exacerbation;Past/Current Experience    Examination-Activity Limitations Locomotion Level;Transfers;Bed Mobility;Reach Overhead;Hygiene/Grooming;Dressing;Toileting;Stairs;Bend    Stability/Clinical Decision Making Evolving/Moderate complexity    Clinical Decision Making Moderate    Rehab Potential Good    PT Frequency 2x / week    PT Duration 8 weeks    PT Treatment/Interventions ADLs/Self Care Home Management;Moist Heat;Gait training;Stair training;Functional mobility training;Therapeutic  activities;Therapeutic exercise;Balance training;Neuromuscular re-education;Patient/family education    PT Next Visit Plan STS PWR! Up with eventual addition of PWR! Step. Work on weLockheed Martinhift cues with STS    PT Home Exercise  Plan 03/01/22: discussed finding and elevated sit surface for future STS exercises (something ~18-19 inches high)    Consulted and Agree with Plan of Care Patient             Patient will benefit from skilled therapeutic intervention in order to improve the following deficits and impairments:     Visit Diagnosis: Muscle weakness (generalized)  Unsteadiness on feet     Problem List Patient Active Problem List   Diagnosis Date Noted   Acute respiratory failure due to COVID-19 ([place]) 05/28/2019   Alcohol dependence with uncomplicated withdrawal ([place]) 01/23/2018   Alcohol withdrawal ([place]) 01/23/2018   GIB (gastrointestinal bleeding) 12/09/2017   A-fib ([place]) 12/09/2017   Esophageal obstruction 10/10/2016   Esophageal ulceration 10/10/2016   GI bleed 09/20/2016   Low vitamin B12 level 03/18/2016   S/P kyphoplasty 03/18/2016   Basal cell carcinoma 02/08/2016   Atrial fibrillation ([place]) 01/27/2016   Colitis 01/27/2016   HLD (hyperlipidemia) 01/27/2016   Anxiety 01/27/2016   Hypokalemia 01/27/2016   Major depressive disorder, recurrent episode, moderate ([place]) 04/14/2015   Anxiety, generalized 12/06/2013   Parkinson disease 08/07/2013   Dysphagia 05/12/2013   Esophageal mass 05/12/2013   GERD (gastroesophageal reflux disease) 05/12/2013   Vomiting 05/12/2013   Open bite of lower leg 05/10/2013   Dog bite of lower leg 05/10/2013   Abnormal finding on liver function 03/25/2013   Benign neoplasm 03/25/2013   Dizziness 03/25/2013   Type 2 diabetes mellitus ([place]) 03/25/2013   BP (high blood pressure) 03/25/2013   Awareness of heartbeats 03/25/2013   Pure hypercholesterolemia 03/25/2013   Infective tonsillitis 03/25/2013   Adenomatous polyp 03/25/2013    7:33 AM, 03/16/22   7:33 AM, 03/16/22   Particia Lather, PT 03/16/2022, 7:33 AM  [place] at [facility] [place], [place], [address] Phone: [phone]   Fax:  [phone]  Name: YOON CLEM MRN: [id number] Date of Birth: 1955/02/20

## 2022-03-16 ENCOUNTER — Encounter: Payer: Self-pay | Admitting: Physical Therapy

## 2022-03-17 ENCOUNTER — Ambulatory Visit: Payer: Medicare Other

## 2022-03-17 DIAGNOSIS — R2681 Unsteadiness on feet: Secondary | ICD-10-CM

## 2022-03-17 DIAGNOSIS — R251 Tremor, unspecified: Secondary | ICD-10-CM

## 2022-03-17 DIAGNOSIS — M6281 Muscle weakness (generalized): Secondary | ICD-10-CM

## 2022-03-17 NOTE — Therapy (Signed)
[place] [facility] [place], [place], [address] Phone: [phone]   Fax:  [phone]  Outpatient Physical Therapy Treatment  Patient Details  Name: Charles Stanton MRN: [id number] Date of Birth: 01-07-1955 Referring Provider (PT): Dr. 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; federal employee secondary    Authorization Time Period 03/01/22-04/26/22    Progress Note Due on Visit 10    PT Start Time 1145    PT Stop Time 1225    PT Time Calculation (min) 40 min    Equipment Utilized During Treatment Gait belt    Activity Tolerance Patient tolerated treatment well;No increased pain    Behavior During Therapy [facility] for tasks assessed/performed                Past Medical History:  Diagnosis Date   Abnormal liver function    Anemia    Anxiety    Atrial fibrillation ([place])    Cancer ([place]) 2017   skin   Colonic mass    Depression    Dizziness    Dry cough    History of colon polyps    Hypercholesteremia    Hyperlipemia    Hypertension    Palpitations    Parkinson's disease    Parkinson's disease    PVD (peripheral vascular disease) ([place])    Tonsillitis 03/25/2013   Tremors of nervous system     Past Surgical History:  Procedure Laterality Date   BACK SURGERY     Kyphoplasty   March 2018   COLONOSCOPY     COLONOSCOPY WITH PROPOFOL N/A 12/12/2017   Procedure: COLONOSCOPY WITH PROPOFOL;  Surgeon: Jonathon Bellows, MD;  Location: [facility] ENDOSCOPY;  Service: Gastroenterology;  Laterality: N/A;   ESOPHAGOGASTRODUODENOSCOPY N/A 12/11/2017   Procedure: ESOPHAGOGASTRODUODENOSCOPY (EGD);  Surgeon: Jonathon Bellows, MD;  Location: [facility] ENDOSCOPY;  Service: Gastroenterology;  Laterality: N/A;   ESOPHAGOGASTRODUODENOSCOPY N/A 10/08/2021    Procedure: ESOPHAGOGASTRODUODENOSCOPY (EGD);  Surgeon: [name], MD;  Location: [facility] ENDOSCOPY;  Service: Endoscopy;  Laterality: N/A;   ESOPHAGOGASTRODUODENOSCOPY (EGD) WITH PROPOFOL N/A 09/21/2016   Procedure: ESOPHAGOGASTRODUODENOSCOPY (EGD) WITH PROPOFOL;  Surgeon: Jonathon Bellows, MD;  Location: [facility] ENDOSCOPY;  Service: Gastroenterology;  Laterality: N/A;   ESOPHAGOGASTRODUODENOSCOPY (EGD) WITH PROPOFOL N/A 11/07/2016   Procedure: ESOPHAGOGASTRODUODENOSCOPY (EGD) WITH PROPOFOL;  Surgeon: Jonathon Bellows, MD;  Location: [facility] ENDOSCOPY;  Service: Gastroenterology;  Laterality: N/A;   ESOPHAGOGASTRODUODENOSCOPY (EGD) WITH PROPOFOL N/A 10/15/2019   Procedure: ESOPHAGOGASTRODUODENOSCOPY (EGD) WITH PROPOFOL;  Surgeon: [name], MD;  Location: [facility] ENDOSCOPY;  Service: Endoscopy;  Laterality: N/A;   ESOPHAGOGASTRODUODENOSCOPY (EGD) WITH PROPOFOL N/A 10/28/2019   Procedure: ESOPHAGOGASTRODUODENOSCOPY (EGD) WITH PROPOFOL;  Surgeon: [name], MD;  Location: [facility] ENDOSCOPY;  Service: Endoscopy;  Laterality: N/A;   ESOPHAGOGASTRODUODENOSCOPY (EGD) WITH PROPOFOL N/A 12/02/2019   Procedure: ESOPHAGOGASTRODUODENOSCOPY (EGD) WITH PROPOFOL;  Surgeon: [name], MD;  Location: [facility] ENDOSCOPY;  Service: Endoscopy;  Laterality: N/A;   ESOPHAGOGASTRODUODENOSCOPY (EGD) WITH PROPOFOL N/A 02/18/2020   Procedure: ESOPHAGOGASTRODUODENOSCOPY (EGD) WITH PROPOFOL;  Surgeon: [name], MD;  Location: [facility] ENDOSCOPY;  Service: Endoscopy;  Laterality: N/A;   ESOPHAGOGASTRODUODENOSCOPY (EGD) WITH PROPOFOL N/A 08/02/2021   Procedure: ESOPHAGOGASTRODUODENOSCOPY (EGD) WITH PROPOFOL;  Surgeon: Haig Prophet,  Hilton Cork, MD;  Location: [facility] ENDOSCOPY;  Service: Endoscopy;  Laterality: N/A;  REQUEST AM   ESOPHAGOGASTRODUODENOSCOPY (EGD) WITH PROPOFOL N/A 10/05/2021   Procedure: ESOPHAGOGASTRODUODENOSCOPY (EGD) WITH PROPOFOL;  Surgeon: [name], MD;  Location: [facility] ENDOSCOPY;  Service:  Endoscopy;  Laterality: N/A;   FRACTURE SURGERY Right 05/28/2019   distal radius fracture   HERNIA REPAIR     left inguinal hernia repair as an infant   KYPHOPLASTY N/A 02/11/2016   Procedure: KYPHOPLASTY  L2,T9;  Surgeon: Hessie Knows, MD;  Location: [facility] ORS;  Service: Orthopedics;  Laterality: N/A;   KYPHOPLASTY N/A 03/15/2016   Procedure: KYPHOPLASTY L3;  Surgeon: Hessie Knows, MD;  Location: [facility] ORS;  Service: Orthopedics;  Laterality: N/A;   OPEN REDUCTION INTERNAL FIXATION (ORIF) DISTAL RADIAL FRACTURE Right 05/28/2019   Procedure: OPEN REDUCTION INTERNAL FIXATION (ORIF) DISTAL RADIAL FRACTURE;  Surgeon: Hessie Knows, MD;  Location: [facility] ORS;  Service: Orthopedics;  Laterality: Right;   skin cancer removed     TONSILLECTOMY       Subjective Assessment -      Subjective Pt denies any updates since prior visit. HEP going well. No falls since last visit.    Pertinent History Charles Stanton is a 58yoM who was prescribed with Parkinson's Disease ~ 10 years.  Pt is followed by neurology, reports good reponse to medications which help control his tremors somewhat. Pt uses a SPC for AMB, also uses a walker for longer distance walking. Pt goes into community almost daily (they like to eat out) which includes lots of SPC [place] eand navigation of 4 stairs at home.    Currently in Pain? No/denies             TREATMENT DATE: 03/17/22  -AMB overground 311f c SPC -STS from chair x6, hands on knees  -extension over 1/2 roll seated in chair 1x12  -STS from chair x6, hands on knees  -extension over 1/2 roll seated in chair 1x12   -AMB overground c 4lb AW, 4WW, 1514fin 61sec -extension over 1/2 roll seated in chair 1x12  -AMB overground c 4lb AW, 4WW, 15039fn 64sec  -overground obstacle loop: forward step up/down over airex pad, then another, then aerobic step plank walk, then 1 foot step wobble disc, then step over 3 QC, 6" step up treadmill plank and step down to red mat, and AMB 10 ft 2x4  balance beam: *twice CCW, twice CW. Most difficulty with balance beam and wobble disc         PT Education -     Education Details Pt educated regarding gre[employer]! Exercise classes and other community offerings.    Person(s) Educated Patient    Methods Explanation;Demonstration ; handout   Comprehension Verbalized understanding;Returned demonstration;Need further instruction              PT Short Term Goals -       PT SHORT TERM GOAL #1   Title Pt to report regular performance of exercises prescribed for home and a sense of improvements in strength and balance AEB them not being as challenging anymore.    Baseline Will issue on visit 2    Time 4    Period Weeks    Status New    Target Date 03/15/22      PT SHORT TERM GOAL #2   Title Pt to demonstrate improved power AEB improved 5xSTS hands free in <18sec.    Baseline 03/01/22: 22sec hands free, author provideing foot block  bilat    Time 4    Period Weeks    Status New    Target Date 03/29/22               PT Long Term Goals -       PT LONG TERM GOAL #1   Title Pt to improve score on FOTO survey to indicated reduced difficulty with basic mobility required for ADL performance.    Baseline 03/01/22:49    Time 8    Period Weeks    Status New    Target Date 04/26/22      PT LONG TERM GOAL #2   Title Pt to improve tolerance to overground AMB c SPC AEB 6MWT performance >880f.    Baseline 2/27: fatigued after 4540flap performed in four minutes ten seconds.    Time 8    Period Weeks    Status New    Target Date 04/26/22      PT LONG TERM GOAL #3   Title Pt to demonstrate improved leg power AEB 5xSTS from chair <14sec hands free and without need for minGuard assist for safety and without need for foot block.    Baseline 03/01/22: 22sec hands free, feet blocked, minGuard assist    Time 8    Period Weeks    Status New    Target Date 04/26/22      PT LONG TERM GOAL #4   Title Pt to demonstrate  improvement in balance performance AEB >6 improvement BERG Balance for balance assessment.    Baseline 41 on 03/03/22   Time 8    Period Weeks    Status New    Target Date 04/26/22              Plan     Clinical Impression Statement Continued with current plan of care as laid out in evaluation and recent prior sessions. All interventions tolerated as expected by author. Mobility, strength =, balance continue to progress as anticipated, but remain significantly impaired. Recovery intervals given as needed based on signs of exertion and/or pt request. Pt educated on best technique for each intervention- author uses verbal, visual, tactile cues to optimize learning. Author takes steps to maximize patient independence when appropriate. Pt remains highly motivated. Pt closely monitored throughout session for safe activity response, as well as to maximize patient safety during interventions. The patient's therapy prognosis indicates continued potential for improvement, anticipate that future progress is attainable in a reasonable/predictable timeframe. Maximum improvement is within reach. Pt will continue to benefit from skilled PT services to address deficits and impairment identified in evaluation in order to maximize independence and safety in basic mobility required for performance of ADL, IADL, and leisure.      Personal Factors and Comorbidities Time since onset of injury/illness/exacerbation;Past/Current Experience    Examination-Activity Limitations Locomotion Level;Transfers;Bed Mobility;Reach Overhead;Hygiene/Grooming;Dressing;Toileting;Stairs;Bend    Stability/Clinical Decision Making Evolving/Moderate complexity    Clinical Decision Making Moderate    Rehab Potential Good    PT Frequency 2x / week    PT Duration 8 weeks    PT Treatment/Interventions ADLs/Self Care Home Management;Moist Heat;Gait training;Stair training;Functional mobility training;Therapeutic activities;Therapeutic  exercise;Balance training;Neuromuscular re-education;Patient/family education    PT Next Visit Plan STS PWR! Up with eventual addition of PWR! Step. Work on weLockheed Martinhift cues with STS    PT Home Exercise Plan 03/01/22: discussed finding and elevated sit surface for future STS exercises (something ~18-19 inches high)    Consulted and Agree with Plan of  Care Patient             Patient will benefit from skilled therapeutic intervention in order to improve the following deficits and impairments:     Visit Diagnosis: Muscle weakness (generalized)  Unsteadiness on feet  Tremor     Problem List Patient Active Problem List   Diagnosis Date Noted   Acute respiratory failure due to COVID-19 ([place]) 05/28/2019   Alcohol dependence with uncomplicated withdrawal ([place]) 01/23/2018   Alcohol withdrawal ([place]) 01/23/2018   GIB (gastrointestinal bleeding) 12/09/2017   A-fib ([place]) 12/09/2017   Esophageal obstruction 10/10/2016   Esophageal ulceration 10/10/2016   GI bleed 09/20/2016   Low vitamin B12 level 03/18/2016   S/P kyphoplasty 03/18/2016   Basal cell carcinoma 02/08/2016   Atrial fibrillation ([place]) 01/27/2016   Colitis 01/27/2016   HLD (hyperlipidemia) 01/27/2016   Anxiety 01/27/2016   Hypokalemia 01/27/2016   Major depressive disorder, recurrent episode, moderate ([place]) 04/14/2015   Anxiety, generalized 12/06/2013   Parkinson disease 08/07/2013   Dysphagia 05/12/2013   Esophageal mass 05/12/2013   GERD (gastroesophageal reflux disease) 05/12/2013   Vomiting 05/12/2013   Open bite of lower leg 05/10/2013   Dog bite of lower leg 05/10/2013   Abnormal finding on liver function 03/25/2013   Benign neoplasm 03/25/2013   Dizziness 03/25/2013   Type 2 diabetes mellitus ([place]) 03/25/2013   BP (high blood pressure) 03/25/2013   Awareness of heartbeats 03/25/2013   Pure hypercholesterolemia 03/25/2013   Infective tonsillitis 03/25/2013   Adenomatous polyp 03/25/2013   11:59 AM,  03/17/22  11:59 AM, 03/17/22 Etta Grandchild, PT, DPT Physical Therapist - [facility]  Outpatient Physical Therapy- [place] [phone]     [place], PT 03/17/2022, 11:59 AM  [facility] [place], [place], [address] Phone: [phone]   Fax:  [phone]  Name: Charles Stanton MRN: [id number] Date of Birth: 1955-04-04

## 2022-03-22 ENCOUNTER — Ambulatory Visit: Payer: Medicare Other | Admitting: Physical Therapy

## 2022-03-22 DIAGNOSIS — M6281 Muscle weakness (generalized): Secondary | ICD-10-CM

## 2022-03-22 DIAGNOSIS — R2681 Unsteadiness on feet: Secondary | ICD-10-CM

## 2022-03-22 DIAGNOSIS — R251 Tremor, unspecified: Secondary | ICD-10-CM

## 2022-03-22 NOTE — Therapy (Signed)
Outpatient Physical Therapy Treatment  Patient Details  Name: Charles Stanton MRN: [id number] Date of Birth: [birth year] Referring Provider (PT): Dr. 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; federal employee secondary    Authorization Time Period 03/01/22-04/26/22    Progress Note Due on Visit 10    PT Start Time 1347    PT Stop Time 1430    PT Time Calculation (min) 43 min    Equipment Utilized During Treatment Gait belt    Activity Tolerance Patient tolerated treatment well;No increased pain    Behavior During Therapy [place] for tasks assessed/performed                Past Medical History:  Diagnosis Date   Abnormal liver function    Anemia    Anxiety    Atrial fibrillation ([place])    Cancer ([place]) 2017   skin   Colonic mass    Depression    Dizziness    Dry cough    History of colon polyps    Hypercholesteremia    Hyperlipemia    Hypertension    Palpitations    Parkinson's disease    Parkinson's disease    PVD (peripheral vascular disease) ([place])    Tonsillitis 03/25/2013   Tremors of nervous system     Past Surgical History:  Procedure Laterality Date   BACK SURGERY     Kyphoplasty   March 2018   COLONOSCOPY     COLONOSCOPY WITH PROPOFOL N/A 12/12/2017   Procedure: COLONOSCOPY WITH PROPOFOL;  Surgeon: Jonathon Bellows, MD;  Location: [facility] ENDOSCOPY;  Service: Gastroenterology;  Laterality: N/A;   ESOPHAGOGASTRODUODENOSCOPY N/A 12/11/2017   Procedure: ESOPHAGOGASTRODUODENOSCOPY (EGD);  Surgeon: Jonathon Bellows, MD;  Location: [facility] ENDOSCOPY;  Service: Gastroenterology;  Laterality: N/A;   ESOPHAGOGASTRODUODENOSCOPY N/A 10/08/2021   Procedure: ESOPHAGOGASTRODUODENOSCOPY (EGD);  Surgeon: [name], MD;  Location: [facility] ENDOSCOPY;  Service: Endoscopy;  Laterality: N/A;    ESOPHAGOGASTRODUODENOSCOPY (EGD) WITH PROPOFOL N/A 09/21/2016   Procedure: ESOPHAGOGASTRODUODENOSCOPY (EGD) WITH PROPOFOL;  Surgeon: Jonathon Bellows, MD;  Location: [facility] ENDOSCOPY;  Service: Gastroenterology;  Laterality: N/A;   ESOPHAGOGASTRODUODENOSCOPY (EGD) WITH PROPOFOL N/A 11/07/2016   Procedure: ESOPHAGOGASTRODUODENOSCOPY (EGD) WITH PROPOFOL;  Surgeon: Jonathon Bellows, MD;  Location: [facility] ENDOSCOPY;  Service: Gastroenterology;  Laterality: N/A;   ESOPHAGOGASTRODUODENOSCOPY (EGD) WITH PROPOFOL N/A 10/15/2019   Procedure: ESOPHAGOGASTRODUODENOSCOPY (EGD) WITH PROPOFOL;  Surgeon: [name], MD;  Location: [facility] ENDOSCOPY;  Service: Endoscopy;  Laterality: N/A;   ESOPHAGOGASTRODUODENOSCOPY (EGD) WITH PROPOFOL N/A 10/28/2019   Procedure: ESOPHAGOGASTRODUODENOSCOPY (EGD) WITH PROPOFOL;  Surgeon: [name], MD;  Location: [facility] ENDOSCOPY;  Service: Endoscopy;  Laterality: N/A;   ESOPHAGOGASTRODUODENOSCOPY (EGD) WITH PROPOFOL N/A 12/02/2019   Procedure: ESOPHAGOGASTRODUODENOSCOPY (EGD) WITH PROPOFOL;  Surgeon: [name], MD;  Location: [facility] ENDOSCOPY;  Service: Endoscopy;  Laterality: N/A;   ESOPHAGOGASTRODUODENOSCOPY (EGD) WITH PROPOFOL N/A 02/18/2020   Procedure: ESOPHAGOGASTRODUODENOSCOPY (EGD) WITH PROPOFOL;  Surgeon: [name], MD;  Location: [facility] ENDOSCOPY;  Service: Endoscopy;  Laterality: N/A;   ESOPHAGOGASTRODUODENOSCOPY (EGD) WITH PROPOFOL N/A 08/02/2021   Procedure: ESOPHAGOGASTRODUODENOSCOPY (EGD) WITH PROPOFOL;  Surgeon: [name], MD;  Location: [facility] ENDOSCOPY;  Service: Endoscopy;  Laterality: N/A;  REQUEST AM   ESOPHAGOGASTRODUODENOSCOPY (EGD) WITH PROPOFOL N/A  10/05/2021   Procedure: ESOPHAGOGASTRODUODENOSCOPY (EGD) WITH PROPOFOL;  Surgeon: [name], MD;  Location: [facility] ENDOSCOPY;  Service: Endoscopy;  Laterality: N/A;   FRACTURE SURGERY Right 05/28/2019   distal radius fracture   HERNIA REPAIR     left inguinal hernia repair as an  infant   KYPHOPLASTY N/A 02/11/2016   Procedure: KYPHOPLASTY  L2,T9;  Surgeon: Hessie Knows, MD;  Location: [facility] ORS;  Service: Orthopedics;  Laterality: N/A;   KYPHOPLASTY N/A 03/15/2016   Procedure: KYPHOPLASTY L3;  Surgeon: Hessie Knows, MD;  Location: [facility] ORS;  Service: Orthopedics;  Laterality: N/A;   OPEN REDUCTION INTERNAL FIXATION (ORIF) DISTAL RADIAL FRACTURE Right 05/28/2019   Procedure: OPEN REDUCTION INTERNAL FIXATION (ORIF) DISTAL RADIAL FRACTURE;  Surgeon: Hessie Knows, MD;  Location: [facility] ORS;  Service: Orthopedics;  Laterality: Right;   skin cancer removed     TONSILLECTOMY       Subjective Assessment -      Subjective Pt reports that he is doing well. States that he helped throw a surprise birthday party for his friend that was recently diagnosed with CA. No new falls or injury since last PT treatment.    Pertinent History Charles Stanton is a 35yoM who was prescribed with Parkinson's Disease ~ 10 years.  Pt is followed by neurology, reports good reponse to medications which help control his tremors somewhat. Pt uses a SPC for AMB, also uses a walker for longer distance walking. Pt goes into community almost daily (they like to eat out) which includes lots of SPC [place] eand navigation of 4 stairs at home.    Currently in Pain? No/denies             TREATMENT DATE: 03/22/22  - gait training overground  3 x 150 x SPC; obstacles on second bout to weave through 5 cones each. - Diagonal stepping in Y on floor 2 x 10 BLE no UE support  - Forward/reverse gait x 76ft with SPC x 1 and no UE support x 2   - Seated trunkal rotation with reach across lap into horizontal abduction x 15 bil.  -STS from chair x10, hands on knees -Lateral step ups to 5 inch step 4 x 3 bil seated rest break between bouts due to RLE weakness  Throughout session, CGA provided by PT with min cues for increased erect posture and increased step length on the RLE as tolerated.         PT Education -      Education Details Pt educated regarding [employer] Exercise classes and other community offerings.  Pt educated throughout session about proper posture and technique with exercises. Improved exercise technique, movement at target joints, use of target muscles after min to mod verbal, visual, tactile cues.    Person(s) Educated Patient    Methods Explanation;Demonstration ; handout   Comprehension Verbalized understanding;Returned demonstration;Need further instruction              PT Short Term Goals -       PT SHORT TERM GOAL #1   Title Pt to report regular performance of exercises prescribed for home and a sense of improvements in strength and balance AEB them not being as challenging anymore.    Baseline Will issue on visit 2    Time 4    Period Weeks    Status New    Target Date 03/15/22      PT SHORT TERM GOAL #2   Title Pt to demonstrate improved power  AEB improved 5xSTS hands free in <18sec.    Baseline 03/01/22: 22sec hands free, author provideing foot block bilat    Time 4    Period Weeks    Status New    Target Date 03/29/22               PT Long Term Goals -       PT LONG TERM GOAL #1   Title Pt to improve score on FOTO survey to indicated reduced difficulty with basic mobility required for ADL performance.    Baseline 03/01/22:49    Time 8    Period Weeks    Status New    Target Date 04/26/22      PT LONG TERM GOAL #2   Title Pt to improve tolerance to overground AMB c SPC AEB 6MWT performance >872ft.    Baseline 2/27: fatigued after 472ft lap performed in four minutes ten seconds.    Time 8    Period Weeks    Status New    Target Date 04/26/22      PT LONG TERM GOAL #3   Title Pt to demonstrate improved leg power AEB 5xSTS from chair <14sec hands free and without need for minGuard assist for safety and without need for foot block.    Baseline 03/01/22: 22sec hands free, feet blocked, minGuard assist    Time 8    Period Weeks    Status New     Target Date 04/26/22      PT LONG TERM GOAL #4   Title Pt to demonstrate improvement in balance performance AEB >6 improvement BERG Balance for balance assessment.    Baseline 41 on 03/03/22   Time 8    Period Weeks    Status New    Target Date 04/26/22              Plan     Clinical Impression Statement Continued with current plan of care as laid out in evaluation and recent prior sessions. Pt able to perform all therapeutic  activities to improve coordination, strength and endurance with good effort. Therapeutic rest break provided throughout session with noted fatigue and increased tremor on the RLE.  Pt will continue to benefit from skilled PT services to address deficits and impairment identified in evaluation in order to maximize independence and safety in basic mobility required for performance of ADL, IADL, and leisure.      Personal Factors and Comorbidities Time since onset of injury/illness/exacerbation;Past/Current Experience    Examination-Activity Limitations Locomotion Level;Transfers;Bed Mobility;Reach Overhead;Hygiene/Grooming;Dressing;Toileting;Stairs;Bend    Stability/Clinical Decision Making Evolving/Moderate complexity    Clinical Decision Making Moderate    Rehab Potential Good    PT Frequency 2x / week    PT Duration 8 weeks    PT Treatment/Interventions ADLs/Self Care Home Management;Moist Heat;Gait training;Stair training;Functional mobility training;Therapeutic activities;Therapeutic exercise;Balance training;Neuromuscular re-education;Patient/family education    PT Next Visit Plan STS PWR! Up with eventual addition of PWR! Step. Work on weight shift cues with STS. Dynamic balance/gait training as indicated.    PT Home Exercise Plan 03/01/22: discussed finding and elevated sit surface for future STS exercises (something ~18-19 inches high)    Consulted and Agree with Plan of Care Patient             Patient will benefit from skilled therapeutic  intervention in order to improve the following deficits and impairments:  balance, strength, coordination, gait, transfers. Posture. ROM   Visit Diagnosis: Muscle weakness (generalized)  Unsteadiness on  feet  Tremor    Problem List Patient Active Problem List   Diagnosis Date Noted   Acute respiratory failure due to COVID-19 ([place]) 05/28/2019   Alcohol dependence with uncomplicated withdrawal ([place]) 01/23/2018   Alcohol withdrawal ([place]) 01/23/2018   GIB (gastrointestinal bleeding) 12/09/2017   A-fib ([place]) 12/09/2017   Esophageal obstruction 10/10/2016   Esophageal ulceration 10/10/2016   GI bleed 09/20/2016   Low vitamin B12 level 03/18/2016   S/P kyphoplasty 03/18/2016   Basal cell carcinoma 02/08/2016   Atrial fibrillation ([place]) 01/27/2016   Colitis 01/27/2016   HLD (hyperlipidemia) 01/27/2016   Anxiety 01/27/2016   Hypokalemia 01/27/2016   Major depressive disorder, recurrent episode, moderate ([place]) 04/14/2015   Anxiety, generalized 12/06/2013   Parkinson disease 08/07/2013   Dysphagia 05/12/2013   Esophageal mass 05/12/2013   GERD (gastroesophageal reflux disease) 05/12/2013   Vomiting 05/12/2013   Open bite of lower leg 05/10/2013   Dog bite of lower leg 05/10/2013   Abnormal finding on liver function 03/25/2013   Benign neoplasm 03/25/2013   Dizziness 03/25/2013   Type 2 diabetes mellitus ([place]) 03/25/2013   BP (high blood pressure) 03/25/2013   Awareness of heartbeats 03/25/2013   Pure hypercholesterolemia 03/25/2013   Infective tonsillitis 03/25/2013   Adenomatous polyp 03/25/2013      Lorie Phenix, PT 03/22/2022, 2:58 PM  [place] [facility] [place], [place], [address] Phone: [phone]   Fax:  [phone]

## 2022-03-24 ENCOUNTER — Ambulatory Visit: Payer: Medicare Other | Admitting: Physical Therapy

## 2022-03-24 DIAGNOSIS — R2681 Unsteadiness on feet: Secondary | ICD-10-CM

## 2022-03-24 DIAGNOSIS — R251 Tremor, unspecified: Secondary | ICD-10-CM

## 2022-03-24 DIAGNOSIS — M6281 Muscle weakness (generalized): Secondary | ICD-10-CM

## 2022-03-24 NOTE — Therapy (Signed)
Outpatient Physical Therapy Treatment  Patient Details  Name: Charles Stanton MRN: [id number] Date of Birth: 01-22-1955 Referring Provider (PT): Dr. 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; federal employee secondary    Authorization Time Period 03/01/22-04/26/22    Progress Note Due on Visit 10    PT Start Time 1438    PT Stop Time 1517    PT Time Calculation (min) 39 min    Equipment Utilized During Treatment Gait belt    Activity Tolerance Patient tolerated treatment well;No increased pain    Behavior During Therapy [facility] for tasks assessed/performed                Past Medical History:  Diagnosis Date   Abnormal liver function    Anemia    Anxiety    Atrial fibrillation ([place])    Cancer ([place]) 2017   skin   Colonic mass    Depression    Dizziness    Dry cough    History of colon polyps    Hypercholesteremia    Hyperlipemia    Hypertension    Palpitations    Parkinson's disease    Parkinson's disease    PVD (peripheral vascular disease) ([place])    Tonsillitis 03/25/2013   Tremors of nervous system     Past Surgical History:  Procedure Laterality Date   BACK SURGERY     Kyphoplasty   March 2018   COLONOSCOPY     COLONOSCOPY WITH PROPOFOL N/A 12/12/2017   Procedure: COLONOSCOPY WITH PROPOFOL;  Surgeon: Jonathon Bellows, MD;  Location: [facility] ENDOSCOPY;  Service: Gastroenterology;  Laterality: N/A;   ESOPHAGOGASTRODUODENOSCOPY N/A 12/11/2017   Procedure: ESOPHAGOGASTRODUODENOSCOPY (EGD);  Surgeon: Jonathon Bellows, MD;  Location: [facility] ENDOSCOPY;  Service: Gastroenterology;  Laterality: N/A;   ESOPHAGOGASTRODUODENOSCOPY N/A 10/08/2021   Procedure: ESOPHAGOGASTRODUODENOSCOPY (EGD);  Surgeon: [name], MD;  Location: [facility] ENDOSCOPY;  Service: Endoscopy;  Laterality: N/A;    ESOPHAGOGASTRODUODENOSCOPY (EGD) WITH PROPOFOL N/A 09/21/2016   Procedure: ESOPHAGOGASTRODUODENOSCOPY (EGD) WITH PROPOFOL;  Surgeon: Jonathon Bellows, MD;  Location: [facility] ENDOSCOPY;  Service: Gastroenterology;  Laterality: N/A;   ESOPHAGOGASTRODUODENOSCOPY (EGD) WITH PROPOFOL N/A 11/07/2016   Procedure: ESOPHAGOGASTRODUODENOSCOPY (EGD) WITH PROPOFOL;  Surgeon: Jonathon Bellows, MD;  Location: [facility] ENDOSCOPY;  Service: Gastroenterology;  Laterality: N/A;   ESOPHAGOGASTRODUODENOSCOPY (EGD) WITH PROPOFOL N/A 10/15/2019   Procedure: ESOPHAGOGASTRODUODENOSCOPY (EGD) WITH PROPOFOL;  Surgeon: [name], MD;  Location: [facility] ENDOSCOPY;  Service: Endoscopy;  Laterality: N/A;   ESOPHAGOGASTRODUODENOSCOPY (EGD) WITH PROPOFOL N/A 10/28/2019   Procedure: ESOPHAGOGASTRODUODENOSCOPY (EGD) WITH PROPOFOL;  Surgeon: [name], MD;  Location: [facility] ENDOSCOPY;  Service: Endoscopy;  Laterality: N/A;   ESOPHAGOGASTRODUODENOSCOPY (EGD) WITH PROPOFOL N/A 12/02/2019   Procedure: ESOPHAGOGASTRODUODENOSCOPY (EGD) WITH PROPOFOL;  Surgeon: [name], MD;  Location: [facility] ENDOSCOPY;  Service: Endoscopy;  Laterality: N/A;   ESOPHAGOGASTRODUODENOSCOPY (EGD) WITH PROPOFOL N/A 02/18/2020   Procedure: ESOPHAGOGASTRODUODENOSCOPY (EGD) WITH PROPOFOL;  Surgeon: [name], MD;  Location: [facility] ENDOSCOPY;  Service: Endoscopy;  Laterality: N/A;   ESOPHAGOGASTRODUODENOSCOPY (EGD) WITH PROPOFOL N/A 08/02/2021   Procedure: ESOPHAGOGASTRODUODENOSCOPY (EGD) WITH PROPOFOL;  Surgeon: [name], MD;  Location: [facility] ENDOSCOPY;  Service: Endoscopy;  Laterality: N/A;  REQUEST AM   ESOPHAGOGASTRODUODENOSCOPY (EGD) WITH PROPOFOL N/A  10/05/2021   Procedure: ESOPHAGOGASTRODUODENOSCOPY (EGD) WITH PROPOFOL;  Surgeon: [name], MD;  Location: [facility] ENDOSCOPY;  Service: Endoscopy;  Laterality: N/A;   FRACTURE SURGERY Right 05/28/2019   distal radius fracture   HERNIA REPAIR     left inguinal hernia repair as an  infant   KYPHOPLASTY N/A 02/11/2016   Procedure: KYPHOPLASTY  L2,T9;  Surgeon: Hessie Knows, MD;  Location: [facility] ORS;  Service: Orthopedics;  Laterality: N/A;   KYPHOPLASTY N/A 03/15/2016   Procedure: KYPHOPLASTY L3;  Surgeon: Hessie Knows, MD;  Location: [facility] ORS;  Service: Orthopedics;  Laterality: N/A;   OPEN REDUCTION INTERNAL FIXATION (ORIF) DISTAL RADIAL FRACTURE Right 05/28/2019   Procedure: OPEN REDUCTION INTERNAL FIXATION (ORIF) DISTAL RADIAL FRACTURE;  Surgeon: Hessie Knows, MD;  Location: [facility] ORS;  Service: Orthopedics;  Laterality: Right;   skin cancer removed     TONSILLECTOMY       Subjective Assessment -      Subjective Pt reports that he is doing well. No other updates to report.    Pertinent History Charles Stanton is a 77yoM who was prescribed with Parkinson's Disease ~ 10 years.  Pt is followed by neurology, reports good reponse to medications which help control his tremors somewhat. Pt uses a SPC for AMB, also uses a walker for longer distance walking. Pt goes into community almost daily (they like to eat out) which includes lots of SPC [place] eand navigation of 4 stairs at home.    Currently in Pain? No/denies             TREATMENT DATE: 03/24/22  Dynamic gait training with SPC into rehab gym x 66ft and Rollator x 111ft then 359ft with 4# ankle weights x 2 bouts.  Forward/reverse gait with 4# ankle weights and UE support on rollator 49ft x 3 for 2 bouts.  Weave through 6 cones x 3 with rollator and 4# ankle weights x 2 bouts.  Sit<>stand to push kick ball overhead x  2 x 10. Cues for full erect posture    Instruction from PT for posture and and increased step length on BLE and reduce shuffle in turns.therapeutic rest break between bouts due to BLE fatigue     PT Education -     Education Details  Pt educated throughout session about proper posture and technique with exercises. Improved exercise technique, movement at target joints, use of target muscles after min to  mod verbal, visual, tactile cues.    Person(s) Educated Patient    Methods Explanation;Demonstration ; handout   Comprehension Verbalized understanding;Returned demonstration;Need further instruction              PT Short Term Goals -       PT SHORT TERM GOAL #1   Title Pt to report regular performance of exercises prescribed for home and a sense of improvements in strength and balance AEB them not being as challenging anymore.    Baseline Will issue on visit 2    Time 4    Period Weeks    Status New    Target Date 03/15/22      PT SHORT TERM GOAL #2   Title Pt to demonstrate improved power AEB improved 5xSTS hands free in <18sec.    Baseline 03/01/22: 22sec hands free, author provideing foot block bilat    Time 4    Period Weeks    Status New    Target Date 03/29/22  PT Long Term Goals -       PT LONG TERM GOAL #1   Title Pt to improve score on FOTO survey to indicated reduced difficulty with basic mobility required for ADL performance.    Baseline 03/01/22:49    Time 8    Period Weeks    Status New    Target Date 04/26/22      PT LONG TERM GOAL #2   Title Pt to improve tolerance to overground AMB c SPC AEB 6MWT performance >840ft.    Baseline 2/27: fatigued after 420ft lap performed in four minutes ten seconds.    Time 8    Period Weeks    Status New    Target Date 04/26/22      PT LONG TERM GOAL #3   Title Pt to demonstrate improved leg power AEB 5xSTS from chair <14sec hands free and without need for minGuard assist for safety and without need for foot block.    Baseline 03/01/22: 22sec hands free, feet blocked, minGuard assist    Time 8    Period Weeks    Status New    Target Date 04/26/22      PT LONG TERM GOAL #4   Title Pt to demonstrate improvement in balance performance AEB >6 improvement BERG Balance for balance assessment.    Baseline 41 on 03/03/22   Time 8    Period Weeks    Status New    Target Date 04/26/22               Plan     Clinical Impression Statement Continued with current plan of care as laid out in evaluation and recent prior sessions. Pt able to perform all therapeutic  activities to improve coordination, strength and endurance with good effort. Therapeutic rest break provided throughout session with noted fatigue and increased tremor on the RLE. Increased step length noted when annkle weights removed at end of session. Pt will continue to benefit from skilled PT services to address deficits and impairment identified in evaluation in order to maximize independence and safety in basic mobility required for performance of ADL, IADL, and leisure.      Personal Factors and Comorbidities Time since onset of injury/illness/exacerbation;Past/Current Experience    Examination-Activity Limitations Locomotion Level;Transfers;Bed Mobility;Reach Overhead;Hygiene/Grooming;Dressing;Toileting;Stairs;Bend    Stability/Clinical Decision Making Evolving/Moderate complexity    Clinical Decision Making Moderate    Rehab Potential Good    PT Frequency 2x / week    PT Duration 8 weeks    PT Treatment/Interventions ADLs/Self Care Home Management;Moist Heat;Gait training;Stair training;Functional mobility training;Therapeutic activities;Therapeutic exercise;Balance training;Neuromuscular re-education;Patient/family education    PT Next Visit Plan STS PWR! Up with eventual addition of PWR! Step. Work on weight shift cues with STS. Dynamic balance/gait training as indicated.    PT Home Exercise Plan 03/01/22: discussed finding and elevated sit surface for future STS exercises (something ~18-19 inches high)    Consulted and Agree with Plan of Care Patient             Patient will benefit from skilled therapeutic intervention in order to improve the following deficits and impairments:  balance, strength, coordination, gait, transfers. Posture. ROM   Visit Diagnosis: Muscle weakness (generalized)  Unsteadiness on  feet  Tremor    Problem List Patient Active Problem List   Diagnosis Date Noted   Acute respiratory failure due to COVID-19 ([place]) 05/28/2019   Alcohol dependence with uncomplicated withdrawal ([place]) 01/23/2018   Alcohol withdrawal ([place]) 01/23/2018  GIB (gastrointestinal bleeding) 12/09/2017   A-fib ([place]) 12/09/2017   Esophageal obstruction 10/10/2016   Esophageal ulceration 10/10/2016   GI bleed 09/20/2016   Low vitamin B12 level 03/18/2016   S/P kyphoplasty 03/18/2016   Basal cell carcinoma 02/08/2016   Atrial fibrillation ([place]) 01/27/2016   Colitis 01/27/2016   HLD (hyperlipidemia) 01/27/2016   Anxiety 01/27/2016   Hypokalemia 01/27/2016   Major depressive disorder, recurrent episode, moderate ([place]) 04/14/2015   Anxiety, generalized 12/06/2013   Parkinson disease 08/07/2013   Dysphagia 05/12/2013   Esophageal mass 05/12/2013   GERD (gastroesophageal reflux disease) 05/12/2013   Vomiting 05/12/2013   Open bite of lower leg 05/10/2013   Dog bite of lower leg 05/10/2013   Abnormal finding on liver function 03/25/2013   Benign neoplasm 03/25/2013   Dizziness 03/25/2013   Type 2 diabetes mellitus ([place]) 03/25/2013   BP (high blood pressure) 03/25/2013   Awareness of heartbeats 03/25/2013   Pure hypercholesterolemia 03/25/2013   Infective tonsillitis 03/25/2013   Adenomatous polyp 03/25/2013      Lorie Phenix, PT 03/24/2022, 3:19 PM  [place] at [facility] [place], [place], [address] Phone: [phone]   Fax:  [phone]

## 2022-03-29 ENCOUNTER — Ambulatory Visit: Payer: Medicare Other | Admitting: Physical Therapy

## 2022-03-29 ENCOUNTER — Encounter: Payer: Self-pay | Admitting: Physical Therapy

## 2022-03-29 DIAGNOSIS — R2681 Unsteadiness on feet: Secondary | ICD-10-CM

## 2022-03-29 DIAGNOSIS — M6281 Muscle weakness (generalized): Secondary | ICD-10-CM | POA: Diagnosis not present

## 2022-03-29 NOTE — Therapy (Signed)
Outpatient Physical Therapy Treatment  Patient Details  Name: Charles Stanton MRN: [id number] Date of Birth: Jun 28, 1955 Referring Provider (PT): Dr. 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; federal employee secondary    Authorization Time Period 03/01/22-04/26/22    Progress Note Due on Visit 10    PT Start Time 1305    PT Stop Time 1345    PT Time Calculation (min) 40 min    Equipment Utilized During Treatment Gait belt    Activity Tolerance Patient tolerated treatment well;No increased pain    Behavior During Therapy [facility] for tasks assessed/performed                Past Medical History:  Diagnosis Date   Abnormal liver function    Anemia    Anxiety    Atrial fibrillation ([place])    Cancer ([place]) 2017   skin   Colonic mass    Depression    Dizziness    Dry cough    History of colon polyps    Hypercholesteremia    Hyperlipemia    Hypertension    Palpitations    Parkinson's disease    Parkinson's disease    PVD (peripheral vascular disease) ([place])    Tonsillitis 03/25/2013   Tremors of nervous system     Past Surgical History:  Procedure Laterality Date   BACK SURGERY     Kyphoplasty   March 2018   COLONOSCOPY     COLONOSCOPY WITH PROPOFOL N/A 12/12/2017   Procedure: COLONOSCOPY WITH PROPOFOL;  Surgeon: Jonathon Bellows, MD;  Location: [facility] ENDOSCOPY;  Service: Gastroenterology;  Laterality: N/A;   ESOPHAGOGASTRODUODENOSCOPY N/A 12/11/2017   Procedure: ESOPHAGOGASTRODUODENOSCOPY (EGD);  Surgeon: Jonathon Bellows, MD;  Location: [facility] ENDOSCOPY;  Service: Gastroenterology;  Laterality: N/A;   ESOPHAGOGASTRODUODENOSCOPY N/A 10/08/2021   Procedure: ESOPHAGOGASTRODUODENOSCOPY (EGD);  Surgeon: [name], MD;  Location: [facility] ENDOSCOPY;  Service: Endoscopy;  Laterality: N/A;    ESOPHAGOGASTRODUODENOSCOPY (EGD) WITH PROPOFOL N/A 09/21/2016   Procedure: ESOPHAGOGASTRODUODENOSCOPY (EGD) WITH PROPOFOL;  Surgeon: Jonathon Bellows, MD;  Location: [facility] ENDOSCOPY;  Service: Gastroenterology;  Laterality: N/A;   ESOPHAGOGASTRODUODENOSCOPY (EGD) WITH PROPOFOL N/A 11/07/2016   Procedure: ESOPHAGOGASTRODUODENOSCOPY (EGD) WITH PROPOFOL;  Surgeon: Jonathon Bellows, MD;  Location: [facility] ENDOSCOPY;  Service: Gastroenterology;  Laterality: N/A;   ESOPHAGOGASTRODUODENOSCOPY (EGD) WITH PROPOFOL N/A 10/15/2019   Procedure: ESOPHAGOGASTRODUODENOSCOPY (EGD) WITH PROPOFOL;  Surgeon: [name], MD;  Location: [facility] ENDOSCOPY;  Service: Endoscopy;  Laterality: N/A;   ESOPHAGOGASTRODUODENOSCOPY (EGD) WITH PROPOFOL N/A 10/28/2019   Procedure: ESOPHAGOGASTRODUODENOSCOPY (EGD) WITH PROPOFOL;  Surgeon: [name], MD;  Location: [facility] ENDOSCOPY;  Service: Endoscopy;  Laterality: N/A;   ESOPHAGOGASTRODUODENOSCOPY (EGD) WITH PROPOFOL N/A 12/02/2019   Procedure: ESOPHAGOGASTRODUODENOSCOPY (EGD) WITH PROPOFOL;  Surgeon: [name], MD;  Location: [facility] ENDOSCOPY;  Service: Endoscopy;  Laterality: N/A;   ESOPHAGOGASTRODUODENOSCOPY (EGD) WITH PROPOFOL N/A 02/18/2020   Procedure: ESOPHAGOGASTRODUODENOSCOPY (EGD) WITH PROPOFOL;  Surgeon: [name], MD;  Location: [facility] ENDOSCOPY;  Service: Endoscopy;  Laterality: N/A;   ESOPHAGOGASTRODUODENOSCOPY (EGD) WITH PROPOFOL N/A 08/02/2021   Procedure: ESOPHAGOGASTRODUODENOSCOPY (EGD) WITH PROPOFOL;  Surgeon: [name], MD;  Location: [facility] ENDOSCOPY;  Service: Endoscopy;  Laterality: N/A;  REQUEST AM   ESOPHAGOGASTRODUODENOSCOPY (EGD) WITH PROPOFOL N/A  10/05/2021   Procedure: ESOPHAGOGASTRODUODENOSCOPY (EGD) WITH PROPOFOL;  Surgeon: [name], MD;  Location: [facility] ENDOSCOPY;  Service: Endoscopy;  Laterality: N/A;   FRACTURE SURGERY Right 05/28/2019   distal radius fracture   HERNIA REPAIR     left inguinal hernia repair as an  infant   KYPHOPLASTY N/A 02/11/2016   Procedure: KYPHOPLASTY  L2,T9;  Surgeon: Hessie Knows, MD;  Location: [facility] ORS;  Service: Orthopedics;  Laterality: N/A;   KYPHOPLASTY N/A 03/15/2016   Procedure: KYPHOPLASTY L3;  Surgeon: Hessie Knows, MD;  Location: [facility] ORS;  Service: Orthopedics;  Laterality: N/A;   OPEN REDUCTION INTERNAL FIXATION (ORIF) DISTAL RADIAL FRACTURE Right 05/28/2019   Procedure: OPEN REDUCTION INTERNAL FIXATION (ORIF) DISTAL RADIAL FRACTURE;  Surgeon: Hessie Knows, MD;  Location: [facility] ORS;  Service: Orthopedics;  Laterality: Right;   skin cancer removed     TONSILLECTOMY       Subjective Assessment -      Subjective Pt reports that he is doing well. No other updates to report.    Pertinent History Charles Stanton is a 58yoM who was prescribed with Parkinson's Disease ~ 10 years.  Pt is followed by neurology, reports good reponse to medications which help control his tremors somewhat. Pt uses a SPC for AMB, also uses a walker for longer distance walking. Pt goes into community almost daily (they like to eat out) which includes lots of SPC [place] eand navigation of 4 stairs at home.    Currently in Pain? No/denies             TREATMENT DATE: 03/29/22   PWR! Standing moves with UE assist 2 x 10 in each position with ea UE if indicated; good performance but fatigue with prolonged standing and large movements involved with this.  Dynamic gait training with SPC into rehab gym x 148ft then 343ft with 4# ankle weights x 2 bouts.  Forward/reverse gait with 4# ankle weights x 50 feet  Seated step over with 4# AW ( seated PWR! Step)    PT Education -     Education Details  Pt educated throughout session about proper posture and technique with exercises. Improved exercise technique, movement at target joints, use of target muscles after min to mod verbal, visual, tactile cues.    Person(s) Educated Patient    Methods Explanation;Demonstration ; handout   Comprehension Verbalized  understanding;Returned demonstration;Need further instruction              PT Short Term Goals -       PT SHORT TERM GOAL #1   Title Pt to report regular performance of exercises prescribed for home and a sense of improvements in strength and balance AEB them not being as challenging anymore.    Baseline Will issue on visit 2    Time 4    Period Weeks    Status New    Target Date 03/15/22      PT SHORT TERM GOAL #2   Title Pt to demonstrate improved power AEB improved 5xSTS hands free in <18sec.    Baseline 03/01/22: 22sec hands free, author provideing foot block bilat    Time 4    Period Weeks    Status New    Target Date 03/29/22               PT Long Term Goals -       PT LONG TERM GOAL #1   Title Pt to improve score on FOTO survey to indicated  reduced difficulty with basic mobility required for ADL performance.    Baseline 03/01/22:49    Time 8    Period Weeks    Status New    Target Date 04/26/22      PT LONG TERM GOAL #2   Title Pt to improve tolerance to overground AMB c SPC AEB 6MWT performance >828ft.    Baseline 2/27: fatigued after 41ft lap performed in four minutes ten seconds.    Time 8    Period Weeks    Status New    Target Date 04/26/22      PT LONG TERM GOAL #3   Title Pt to demonstrate improved leg power AEB 5xSTS from chair <14sec hands free and without need for minGuard assist for safety and without need for foot block.    Baseline 03/01/22: 22sec hands free, feet blocked, minGuard assist    Time 8    Period Weeks    Status New    Target Date 04/26/22      PT LONG TERM GOAL #4   Title Pt to demonstrate improvement in balance performance AEB >6 improvement BERG Balance for balance assessment.    Baseline 41 on 03/03/22   Time 8    Period Weeks    Status New    Target Date 04/26/22              Plan     Clinical Impression Statement Continued with current plan of care as laid out in evaluation and recent prior sessions. Pt  able to perform all therapeutic  activities to improve coordination, strength and endurance with good effort. Therapeutic rest break provided throughout session with noted fatigue and increased tremor on the RLE. Increased step length noted when annkle weights removed at end of session. Pt will continue to benefit from skilled PT services to address deficits and impairment identified in evaluation in order to maximize independence and safety in basic mobility required for performance of ADL, IADL, and leisure.      Personal Factors and Comorbidities Time since onset of injury/illness/exacerbation;Past/Current Experience    Examination-Activity Limitations Locomotion Level;Transfers;Bed Mobility;Reach Overhead;Hygiene/Grooming;Dressing;Toileting;Stairs;Bend    Stability/Clinical Decision Making Evolving/Moderate complexity    Clinical Decision Making Moderate    Rehab Potential Good    PT Frequency 2x / week    PT Duration 8 weeks    PT Treatment/Interventions ADLs/Self Care Home Management;Moist Heat;Gait training;Stair training;Functional mobility training;Therapeutic activities;Therapeutic exercise;Balance training;Neuromuscular re-education;Patient/family education    PT Next Visit Plan STS PWR! Up with eventual addition of PWR! Step. Work on weight shift cues with STS. Dynamic balance/gait training as indicated.    PT Home Exercise Plan 03/01/22: discussed finding and elevated sit surface for future STS exercises (something ~18-19 inches high)    Consulted and Agree with Plan of Care Patient             Patient will benefit from skilled therapeutic intervention in order to improve the following deficits and impairments:  balance, strength, coordination, gait, transfers. Posture. ROM   Visit Diagnosis: Muscle weakness (generalized)  Unsteadiness on feet    Problem List Patient Active Problem List   Diagnosis Date Noted   Acute respiratory failure due to COVID-19 ([place]) 05/28/2019    Alcohol dependence with uncomplicated withdrawal ([place]) 01/23/2018   Alcohol withdrawal ([place]) 01/23/2018   GIB (gastrointestinal bleeding) 12/09/2017   A-fib ([place]) 12/09/2017   Esophageal obstruction 10/10/2016   Esophageal ulceration 10/10/2016   GI bleed 09/20/2016   Low vitamin B12 level  03/18/2016   S/P kyphoplasty 03/18/2016   Basal cell carcinoma 02/08/2016   Atrial fibrillation ([place]) 01/27/2016   Colitis 01/27/2016   HLD (hyperlipidemia) 01/27/2016   Anxiety 01/27/2016   Hypokalemia 01/27/2016   Major depressive disorder, recurrent episode, moderate ([place]) 04/14/2015   Anxiety, generalized 12/06/2013   Parkinson disease 08/07/2013   Dysphagia 05/12/2013   Esophageal mass 05/12/2013   GERD (gastroesophageal reflux disease) 05/12/2013   Vomiting 05/12/2013   Open bite of lower leg 05/10/2013   Dog bite of lower leg 05/10/2013   Abnormal finding on liver function 03/25/2013   Benign neoplasm 03/25/2013   Dizziness 03/25/2013   Type 2 diabetes mellitus ([place]) 03/25/2013   BP (high blood pressure) 03/25/2013   Awareness of heartbeats 03/25/2013   Pure hypercholesterolemia 03/25/2013   Infective tonsillitis 03/25/2013   Adenomatous polyp 03/25/2013      Particia Lather, PT 03/29/2022, 1:20 PM  [place] at [facility] [place], [place], [address] Phone: [phone]   Fax:  [phone]

## 2022-03-31 ENCOUNTER — Ambulatory Visit: Payer: Medicare Other | Admitting: Physical Therapy

## 2022-03-31 DIAGNOSIS — M6281 Muscle weakness (generalized): Secondary | ICD-10-CM | POA: Diagnosis not present

## 2022-03-31 DIAGNOSIS — R251 Tremor, unspecified: Secondary | ICD-10-CM

## 2022-03-31 DIAGNOSIS — R2681 Unsteadiness on feet: Secondary | ICD-10-CM

## 2022-03-31 NOTE — Therapy (Signed)
Outpatient Physical Therapy Treatment PHYSICAL THERAPY PROGRESS NOTE   Dates of reporting period  03/01/2022   to   03/31/2022   Patient Details  Name: Charles Stanton MRN: [id number] Date of Birth: 26-May-1955 Referring Provider (PT): Dr. 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; federal employee secondary    Authorization Time Period 03/01/22-04/26/22    Progress Note Due on Visit 20    PT Start Time 1345    PT Stop Time 1430    PT Time Calculation (min) 45 min    Equipment Utilized During Treatment Gait belt    Activity Tolerance Patient tolerated treatment well;No increased pain    Behavior During Therapy [facility] for tasks assessed/performed                Past Medical History:  Diagnosis Date   Abnormal liver function    Anemia    Anxiety    Atrial fibrillation ([place])    Cancer ([place]) 2017   skin   Colonic mass    Depression    Dizziness    Dry cough    History of colon polyps    Hypercholesteremia    Hyperlipemia    Hypertension    Palpitations    Parkinson's disease    Parkinson's disease    PVD (peripheral vascular disease) ([place])    Tonsillitis 03/25/2013   Tremors of nervous system     Past Surgical History:  Procedure Laterality Date   BACK SURGERY     Kyphoplasty   March 2018   COLONOSCOPY     COLONOSCOPY WITH PROPOFOL N/A 12/12/2017   Procedure: COLONOSCOPY WITH PROPOFOL;  Surgeon: Jonathon Bellows, MD;  Location: [facility] ENDOSCOPY;  Service: Gastroenterology;  Laterality: N/A;   ESOPHAGOGASTRODUODENOSCOPY N/A 12/11/2017   Procedure: ESOPHAGOGASTRODUODENOSCOPY (EGD);  Surgeon: Jonathon Bellows, MD;  Location: [facility] ENDOSCOPY;  Service: Gastroenterology;  Laterality: N/A;   ESOPHAGOGASTRODUODENOSCOPY N/A 10/08/2021   Procedure: ESOPHAGOGASTRODUODENOSCOPY (EGD);  Surgeon: [name], MD;  Location: [facility] ENDOSCOPY;  Service: Endoscopy;  Laterality: N/A;   ESOPHAGOGASTRODUODENOSCOPY (EGD) WITH PROPOFOL N/A 09/21/2016   Procedure: ESOPHAGOGASTRODUODENOSCOPY (EGD) WITH PROPOFOL;  Surgeon: Jonathon Bellows, MD;  Location: [facility] ENDOSCOPY;  Service: Gastroenterology;  Laterality: N/A;   ESOPHAGOGASTRODUODENOSCOPY (EGD) WITH PROPOFOL N/A 11/07/2016   Procedure: ESOPHAGOGASTRODUODENOSCOPY (EGD) WITH PROPOFOL;  Surgeon: Jonathon Bellows, MD;  Location: [facility] ENDOSCOPY;  Service: Gastroenterology;  Laterality: N/A;   ESOPHAGOGASTRODUODENOSCOPY (EGD) WITH PROPOFOL N/A 10/15/2019   Procedure: ESOPHAGOGASTRODUODENOSCOPY (EGD) WITH PROPOFOL;  Surgeon: [name], MD;  Location: [facility] ENDOSCOPY;  Service: Endoscopy;  Laterality: N/A;   ESOPHAGOGASTRODUODENOSCOPY (EGD) WITH PROPOFOL N/A 10/28/2019   Procedure: ESOPHAGOGASTRODUODENOSCOPY (EGD) WITH PROPOFOL;  Surgeon: [name], MD;  Location: [facility] ENDOSCOPY;  Service: Endoscopy;  Laterality: N/A;   ESOPHAGOGASTRODUODENOSCOPY (EGD) WITH PROPOFOL N/A 12/02/2019   Procedure: ESOPHAGOGASTRODUODENOSCOPY (EGD) WITH PROPOFOL;  Surgeon: [name], MD;  Location: [facility] ENDOSCOPY;  Service: Endoscopy;  Laterality: N/A;   ESOPHAGOGASTRODUODENOSCOPY (EGD) WITH PROPOFOL N/A 02/18/2020   Procedure: ESOPHAGOGASTRODUODENOSCOPY (EGD) WITH PROPOFOL;  Surgeon: [name], MD;  Location: [facility] ENDOSCOPY;  Service: Endoscopy;  Laterality: N/A;   ESOPHAGOGASTRODUODENOSCOPY (EGD) WITH PROPOFOL N/A 08/02/2021   Procedure: ESOPHAGOGASTRODUODENOSCOPY (EGD) WITH PROPOFOL;  Surgeon: [name], MD;  Location: [facility] ENDOSCOPY;  Service: Endoscopy;  Laterality: N/A;  REQUEST AM   ESOPHAGOGASTRODUODENOSCOPY (EGD) WITH PROPOFOL N/A 10/05/2021   Procedure: ESOPHAGOGASTRODUODENOSCOPY (EGD) WITH PROPOFOL;  Surgeon: [name], MD;  Location: [facility] ENDOSCOPY;  Service: Endoscopy;  Laterality: N/A;   FRACTURE SURGERY Right 05/28/2019    distal radius fracture   HERNIA REPAIR     left inguinal hernia repair as an infant   KYPHOPLASTY N/A 02/11/2016   Procedure: KYPHOPLASTY  L2,T9;  Surgeon: Hessie Knows, MD;  Location: [facility] ORS;  Service: Orthopedics;  Laterality: N/A;   KYPHOPLASTY N/A 03/15/2016   Procedure: KYPHOPLASTY L3;  Surgeon: Hessie Knows, MD;  Location: [facility] ORS;  Service: Orthopedics;  Laterality: N/A;   OPEN REDUCTION INTERNAL FIXATION (ORIF) DISTAL RADIAL FRACTURE Right 05/28/2019   Procedure: OPEN REDUCTION INTERNAL FIXATION (ORIF) DISTAL RADIAL FRACTURE;  Surgeon: Hessie Knows, MD;  Location: [facility] ORS;  Service: Orthopedics;  Laterality: Right;   skin cancer removed     TONSILLECTOMY       Subjective Assessment -      Subjective Pt reports that he is doing well. No other updates to report.    Pertinent History Charles Stanton is a 69yoM who was prescribed with Parkinson's Disease ~ 10 years.  Pt is followed by neurology, reports good reponse to medications which help control his tremors somewhat. Pt uses a SPC for AMB, also uses a walker for longer distance walking. Pt goes into community almost daily (they like to eat out) which includes lots of SPC [place] eand navigation of 4 stairs at home.    Currently in Pain? No/denies             TREATMENT DATE: 04/01/22   Goal assessment   Pt performed 5 time sit<>stand (5xSTS): 14.82 with CGA for anterior weight shift. 15.3 without assist from PT. UE push from thighs. (>15 sec indicates increased fall risk)   Patient demonstrates increased fall risk as noted by score of  42/56 on Berg Balance Scale.  (<36= high risk for falls, close to 100%; 37-45 significant >80%; 46-51 moderate >50%; 52-55 lower >25%)  6 Min Walk Test:  Instructed patient to ambulate as quickly and as safely as possible for 6 minutes using LRAD. Patient was allowed to take standing rest breaks without stopping the test, but if the patient required a sitting rest break the clock would be stopped and  the test would be over.  Results: 2 feet using a SPC with distant supervision assist. Results indicate that the patient has reduced endurance with ambulation compared to age matched norms.  Age Matched Norms: 99-69 yo M: 69 F: 61, 50-79 yo M: 43 F: 67, 10-89 yo M: 49 F: 392 MDC: 58.21 meters (190.98 feet) or 50 meters (ANPTA Core Set of Outcome Measures for Adults with Neurologic Conditions, 2018)  Dynamic balance training to perform forward lunge x 10 BUE, then forward lunge with ipsilateral forward reach and rotation.     PT Education -     Education Details  Pt educated throughout session about proper posture and technique with exercises. Improved exercise technique, movement at target joints, use of target muscles after min to mod verbal, visual, tactile cues.    Person(s) Educated Patient    Methods Explanation;Demonstration ; handout   Comprehension Verbalized understanding;Returned demonstration;Need further instruction              PT Short Term Goals -       PT SHORT TERM GOAL #1  Title Pt to report regular performance of exercises prescribed for home and a sense of improvements in strength and balance AEB them not being as challenging anymore.    Baseline Will issue on visit 2    Time 4    Period Weeks    Status met   Target Date 03/15/22      PT SHORT TERM GOAL #2   Title Pt to demonstrate improved power AEB improved 5xSTS hands free in <18sec.    Baseline 03/01/22: 22sec hands free, author provideing foot block bilat    Time 4    Period Weeks    Status Met    Target Date 03/29/22               PT Long Term Goals -       PT LONG TERM GOAL #1   Title Pt to improve score on FOTO survey to indicated reduced difficulty with basic mobility required for ADL performance.    Baseline 03/01/22:49    Time 8    Period Weeks    Status New    Target Date 04/26/22      PT LONG TERM GOAL #2   Title Pt to improve tolerance to overground AMB c SPC AEB  6MWT performance >8110ft.    Baseline 2/27: fatigued after 427ft lap performed in four minutes ten seconds. 03/31/22: 720ft with SPC no rest break   Time 8    Period Weeks    Status progressing   Target Date 04/26/22      PT LONG TERM GOAL #3   Title Pt to demonstrate improved leg power AEB 5xSTS from chair <14sec hands free and without need for minGuard assist for safety and without need for foot block.    Baseline 03/01/22: 22sec hands free, feet blocked, minGuard assist  03/31/22: 14.82sec   Time 8    Period Weeks    Status Progressing    Target Date 04/26/22      PT LONG TERM GOAL #4   Title Pt to demonstrate improvement in balance performance AEB >6 improvement BERG Balance for balance assessment.    Baseline 41 on 03/03/22. 3/38/24: 42   Time 8    Period Weeks    Status Progressing    Target Date 04/26/22              Plan     Clinical Impression Statement Pt continues to make steady progress towards LTG to improve balance, strength, and coordination. Improved function seen on improved  83minWT, Berg, and 5xSTS Patient's condition has the potential to improve in response to therapy. Maximum improvement is yet to be obtained. The anticipated improvement is attainable and reasonable in a generally predictable time. Pt will continue to benefit from skilled PT services to address deficits and impairment identified in evaluation in order to maximize independence and safety in basic mobility required for performance of ADL, IADL, and leisure.      Personal Factors and Comorbidities Time since onset of injury/illness/exacerbation;Past/Current Experience    Examination-Activity Limitations Locomotion Level;Transfers;Bed Mobility;Reach Overhead;Hygiene/Grooming;Dressing;Toileting;Stairs;Bend    Stability/Clinical Decision Making Evolving/Moderate complexity    Clinical Decision Making Moderate    Rehab Potential Good    PT Frequency 2x / week    PT Duration 8 weeks    PT  Treatment/Interventions ADLs/Self Care Home Management;Moist Heat;Gait training;Stair training;Functional mobility training;Therapeutic activities;Therapeutic exercise;Balance training;Neuromuscular re-education;Patient/family education    PT Next Visit Plan Complete FOTO.  STS PWR! Up with eventual addition of PWR!  Step. Work on weight shift cues with STS. Dynamic balance/gait training as indicated.    PT Home Exercise Plan 03/01/22: discussed finding and elevated sit surface for future STS exercises (something ~18-19 inches high)    Consulted and Agree with Plan of Care Patient             Patient will benefit from skilled therapeutic intervention in order to improve the following deficits and impairments:  balance, strength, coordination, gait, transfers. Posture. ROM   Visit Diagnosis: Muscle weakness (generalized)  Unsteadiness on feet  Tremor    Problem List Patient Active Problem List   Diagnosis Date Noted   Acute respiratory failure due to COVID-19 ([place]) 05/28/2019   Alcohol dependence with uncomplicated withdrawal ([place]) 01/23/2018   Alcohol withdrawal ([place]) 01/23/2018   GIB (gastrointestinal bleeding) 12/09/2017   A-fib ([place]) 12/09/2017   Esophageal obstruction 10/10/2016   Esophageal ulceration 10/10/2016   GI bleed 09/20/2016   Low vitamin B12 level 03/18/2016   S/P kyphoplasty 03/18/2016   Basal cell carcinoma 02/08/2016   Atrial fibrillation ([place]) 01/27/2016   Colitis 01/27/2016   HLD (hyperlipidemia) 01/27/2016   Anxiety 01/27/2016   Hypokalemia 01/27/2016   Major depressive disorder, recurrent episode, moderate ([place]) 04/14/2015   Anxiety, generalized 12/06/2013   Parkinson disease 08/07/2013   Dysphagia 05/12/2013   Esophageal mass 05/12/2013   GERD (gastroesophageal reflux disease) 05/12/2013   Vomiting 05/12/2013   Open bite of lower leg 05/10/2013   Dog bite of lower leg 05/10/2013   Abnormal finding on liver function 03/25/2013   Benign neoplasm  03/25/2013   Dizziness 03/25/2013   Type 2 diabetes mellitus ([place]) 03/25/2013   BP (high blood pressure) 03/25/2013   Awareness of heartbeats 03/25/2013   Pure hypercholesterolemia 03/25/2013   Infective tonsillitis 03/25/2013   Adenomatous polyp 03/25/2013      Lorie Phenix, PT 04/01/2022, 8:41 AM  [place] at [facility] [place], [place], [address] Phone: [phone]   Fax:  [phone]

## 2022-04-05 ENCOUNTER — Ambulatory Visit: Payer: Medicare Other | Attending: Neurology | Admitting: Physical Therapy

## 2022-04-05 DIAGNOSIS — R2689 Other abnormalities of gait and mobility: Secondary | ICD-10-CM

## 2022-04-05 DIAGNOSIS — R262 Difficulty in walking, not elsewhere classified: Secondary | ICD-10-CM | POA: Diagnosis present

## 2022-04-05 DIAGNOSIS — R2681 Unsteadiness on feet: Secondary | ICD-10-CM | POA: Diagnosis present

## 2022-04-05 DIAGNOSIS — M6281 Muscle weakness (generalized): Secondary | ICD-10-CM | POA: Diagnosis present

## 2022-04-05 NOTE — Therapy (Signed)
Outpatient Physical Therapy Treatment   Patient Details  Name: Charles Stanton MRN: [id number] Date of Birth: May 28, 1955 Referring Provider (PT): Dr. 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; federal employee secondary    Authorization Time Period 03/01/22-04/26/22    Progress Note Due on Visit 20    PT Start Time 1301    PT Stop Time 1343    PT Time Calculation (min) 42 min    Equipment Utilized During Treatment Gait belt    Activity Tolerance Patient tolerated treatment well;No increased pain    Behavior During Therapy [facility] for tasks assessed/performed                 Past Medical History:  Diagnosis Date   Abnormal liver function    Anemia    Anxiety    Atrial fibrillation ([place])    Cancer ([place]) 2017   skin   Colonic mass    Depression    Dizziness    Dry cough    History of colon polyps    Hypercholesteremia    Hyperlipemia    Hypertension    Palpitations    Parkinson's disease    Parkinson's disease    PVD (peripheral vascular disease) ([place])    Tonsillitis 03/25/2013   Tremors of nervous system     Past Surgical History:  Procedure Laterality Date   BACK SURGERY     Kyphoplasty   March 2018   COLONOSCOPY     COLONOSCOPY WITH PROPOFOL N/A 12/12/2017   Procedure: COLONOSCOPY WITH PROPOFOL;  Surgeon: Jonathon Bellows, MD;  Location: [facility] ENDOSCOPY;  Service: Gastroenterology;  Laterality: N/A;   ESOPHAGOGASTRODUODENOSCOPY N/A 12/11/2017   Procedure: ESOPHAGOGASTRODUODENOSCOPY (EGD);  Surgeon: Jonathon Bellows, MD;  Location: [facility] ENDOSCOPY;  Service: Gastroenterology;  Laterality: N/A;   ESOPHAGOGASTRODUODENOSCOPY N/A 10/08/2021   Procedure: ESOPHAGOGASTRODUODENOSCOPY (EGD);  Surgeon: [name], MD;  Location: [facility] ENDOSCOPY;  Service: Endoscopy;  Laterality: N/A;   ESOPHAGOGASTRODUODENOSCOPY  (EGD) WITH PROPOFOL N/A 09/21/2016   Procedure: ESOPHAGOGASTRODUODENOSCOPY (EGD) WITH PROPOFOL;  Surgeon: Jonathon Bellows, MD;  Location: [facility] ENDOSCOPY;  Service: Gastroenterology;  Laterality: N/A;   ESOPHAGOGASTRODUODENOSCOPY (EGD) WITH PROPOFOL N/A 11/07/2016   Procedure: ESOPHAGOGASTRODUODENOSCOPY (EGD) WITH PROPOFOL;  Surgeon: Jonathon Bellows, MD;  Location: [facility] ENDOSCOPY;  Service: Gastroenterology;  Laterality: N/A;   ESOPHAGOGASTRODUODENOSCOPY (EGD) WITH PROPOFOL N/A 10/15/2019   Procedure: ESOPHAGOGASTRODUODENOSCOPY (EGD) WITH PROPOFOL;  Surgeon: [name], MD;  Location: [facility] ENDOSCOPY;  Service: Endoscopy;  Laterality: N/A;   ESOPHAGOGASTRODUODENOSCOPY (EGD) WITH PROPOFOL N/A 10/28/2019   Procedure: ESOPHAGOGASTRODUODENOSCOPY (EGD) WITH PROPOFOL;  Surgeon: [name], MD;  Location: [facility] ENDOSCOPY;  Service: Endoscopy;  Laterality: N/A;   ESOPHAGOGASTRODUODENOSCOPY (EGD) WITH PROPOFOL N/A 12/02/2019   Procedure: ESOPHAGOGASTRODUODENOSCOPY (EGD) WITH PROPOFOL;  Surgeon: [name], MD;  Location: [facility] ENDOSCOPY;  Service: Endoscopy;  Laterality: N/A;   ESOPHAGOGASTRODUODENOSCOPY (EGD) WITH PROPOFOL N/A 02/18/2020   Procedure: ESOPHAGOGASTRODUODENOSCOPY (EGD) WITH PROPOFOL;  Surgeon: [name], MD;  Location: [facility] ENDOSCOPY;  Service: Endoscopy;  Laterality: N/A;   ESOPHAGOGASTRODUODENOSCOPY (EGD) WITH PROPOFOL N/A 08/02/2021   Procedure: ESOPHAGOGASTRODUODENOSCOPY (EGD) WITH PROPOFOL;  Surgeon: [name], MD;  Location: [facility] ENDOSCOPY;  Service: Endoscopy;  Laterality: N/A;  REQUEST AM   ESOPHAGOGASTRODUODENOSCOPY (EGD) WITH PROPOFOL N/A 10/05/2021   Procedure:  ESOPHAGOGASTRODUODENOSCOPY (EGD) WITH PROPOFOL;  Surgeon: [name], MD;  Location: [facility] ENDOSCOPY;  Service: Endoscopy;  Laterality: N/A;   FRACTURE SURGERY Right 05/28/2019   distal radius fracture   HERNIA REPAIR     left inguinal hernia repair as an infant   KYPHOPLASTY N/A  02/11/2016   Procedure: KYPHOPLASTY  L2,T9;  Surgeon: Hessie Knows, MD;  Location: [facility] ORS;  Service: Orthopedics;  Laterality: N/A;   KYPHOPLASTY N/A 03/15/2016   Procedure: KYPHOPLASTY L3;  Surgeon: Hessie Knows, MD;  Location: [facility] ORS;  Service: Orthopedics;  Laterality: N/A;   OPEN REDUCTION INTERNAL FIXATION (ORIF) DISTAL RADIAL FRACTURE Right 05/28/2019   Procedure: OPEN REDUCTION INTERNAL FIXATION (ORIF) DISTAL RADIAL FRACTURE;  Surgeon: Hessie Knows, MD;  Location: [facility] ORS;  Service: Orthopedics;  Laterality: Right;   skin cancer removed     TONSILLECTOMY       Subjective Assessment -      Subjective Pt reports that he is doing well. No other updates to report.    Pertinent History Charles Stanton is a 45yoM who was prescribed with Parkinson's Disease ~ 10 years.  Pt is followed by neurology, reports good reponse to medications which help control his tremors somewhat. Pt uses a SPC for AMB, also uses a walker for longer distance walking. Pt goes into community almost daily (they like to eat out) which includes lots of SPC [place] eand navigation of 4 stairs at home.    Currently in Pain? No/denies             TREATMENT DATE: 04/05/22  Seated PWR! Moves x 10 ea and on ea side when applicable   PWR! Standing moves with UE assist 2 x 10 in each position with ea UE if indicated; good performance but fatigue with prolonged standing and large movements involved with this.   Dynamic gait training with SPC into rehab gym x  336ft with 4# ankle weights x 2 bouts.   Seated step over orange hurdle with 4# AW ( seated PWR! Step) x 10 e LE  STS with PWR! Up modification x 10 reps with cues for using forward weight shift and momentum.       PT Education -     Education Details  Pt educated regarding Life Vac for choking symptoms if needed as he expressed fear of choking with PD.     Person(s) Educated Patient    Methods Explanation;Demonstration ; handout   Comprehension Verbalized  understanding;Returned demonstration;Need further instruction              PT Short Term Goals -       PT SHORT TERM GOAL #1   Title Pt to report regular performance of exercises prescribed for home and a sense of improvements in strength and balance AEB them not being as challenging anymore.    Baseline Will issue on visit 2    Time 4    Period Weeks    Status met   Target Date 03/15/22      PT SHORT TERM GOAL #2   Title Pt to demonstrate improved power AEB improved 5xSTS hands free in <18sec.    Baseline 03/01/22: 22sec hands free, author provideing foot block bilat    Time 4    Period Weeks    Status Met    Target Date 03/29/22               PT Long Term Goals -       PT LONG  TERM GOAL #1   Title Pt to improve score on FOTO survey to indicated reduced difficulty with basic mobility required for ADL performance.    Baseline 03/01/22:49    Time 8    Period Weeks    Status New    Target Date 04/26/22      PT LONG TERM GOAL #2   Title Pt to improve tolerance to overground AMB c SPC AEB 6MWT performance >823ft.    Baseline 2/27: fatigued after 483ft lap performed in four minutes ten seconds. 03/31/22: 737ft with SPC no rest break   Time 8    Period Weeks    Status progressing   Target Date 04/26/22      PT LONG TERM GOAL #3   Title Pt to demonstrate improved leg power AEB 5xSTS from chair <14sec hands free and without need for minGuard assist for safety and without need for foot block.    Baseline 03/01/22: 22sec hands free, feet blocked, minGuard assist  03/31/22: 14.82sec   Time 8    Period Weeks    Status Progressing    Target Date 04/26/22      PT LONG TERM GOAL #4   Title Pt to demonstrate improvement in balance performance AEB >6 improvement BERG Balance for balance assessment.    Baseline 41 on 03/03/22. 3/38/24: 42   Time 8    Period Weeks    Status Progressing    Target Date 04/26/22              Plan     Clinical Impression Statement  Continued with current plan of care as laid out in evaluation and recent prior sessions. Pt remains motivated to advance progress toward goals in order to maximize independence and safety at home. Pt requires high level assistance and cuing for completion of exercises in order to provide adequate level of stimulation and perturbation. Author allows pt as much opportunity as possible to perform independent righting strategies, only stepping in when pt is unable to prevent falling to floor. Pt closely monitored throughout session for safe vitals response and to maximize patient safety during interventions. Pt continues to demonstrate progress toward goals AEB progression of some interventions this date either in volume or intensity.      Personal Factors and Comorbidities Time since onset of injury/illness/exacerbation;Past/Current Experience    Examination-Activity Limitations Locomotion Level;Transfers;Bed Mobility;Reach Overhead;Hygiene/Grooming;Dressing;Toileting;Stairs;Bend    Stability/Clinical Decision Making Evolving/Moderate complexity    Clinical Decision Making Moderate    Rehab Potential Good    PT Frequency 2x / week    PT Duration 8 weeks    PT Treatment/Interventions ADLs/Self Care Home Management;Moist Heat;Gait training;Stair training;Functional mobility training;Therapeutic activities;Therapeutic exercise;Balance training;Neuromuscular re-education;Patient/family education    PT Next Visit Plan Complete FOTO.  STS PWR! Up with eventual addition of PWR! Step. Work on weight shift cues with STS. Dynamic balance/gait training as indicated.    PT Home Exercise Plan 03/01/22: discussed finding and elevated sit surface for future STS exercises (something ~18-19 inches high)    Consulted and Agree with Plan of Care Patient             Patient will benefit from skilled therapeutic intervention in order to improve the following deficits and impairments:  balance, strength, coordination,  gait, transfers. Posture. ROM   Visit Diagnosis: No diagnosis found.    Problem List Patient Active Problem List   Diagnosis Date Noted   Acute respiratory failure due to COVID-19 05/28/2019   Alcohol dependence with uncomplicated  withdrawal 01/23/2018   Alcohol withdrawal 01/23/2018   GIB (gastrointestinal bleeding) 12/09/2017   A-fib 12/09/2017   Esophageal obstruction 10/10/2016   Esophageal ulceration 10/10/2016   GI bleed 09/20/2016   Low vitamin B12 level 03/18/2016   S/P kyphoplasty 03/18/2016   Basal cell carcinoma 02/08/2016   Atrial fibrillation 01/27/2016   Colitis 01/27/2016   HLD (hyperlipidemia) 01/27/2016   Anxiety 01/27/2016   Hypokalemia 01/27/2016   Major depressive disorder, recurrent episode, moderate 04/14/2015   Anxiety, generalized 12/06/2013   Parkinson disease 08/07/2013   Dysphagia 05/12/2013   Esophageal mass 05/12/2013   GERD (gastroesophageal reflux disease) 05/12/2013   Vomiting 05/12/2013   Open bite of lower leg 05/10/2013   Dog bite of lower leg 05/10/2013   Abnormal finding on liver function 03/25/2013   Benign neoplasm 03/25/2013   Dizziness 03/25/2013   Type 2 diabetes mellitus 03/25/2013   BP (high blood pressure) 03/25/2013   Awareness of heartbeats 03/25/2013   Pure hypercholesterolemia 03/25/2013   Infective tonsillitis 03/25/2013   Adenomatous polyp 03/25/2013      Particia Lather, PT 04/05/2022, 1:23 PM  [place] at [facility] [place], [place], [address] Phone: [phone]   Fax:  [phone]

## 2022-04-07 ENCOUNTER — Ambulatory Visit: Payer: Medicare Other | Admitting: Physical Therapy

## 2022-04-07 DIAGNOSIS — M6281 Muscle weakness (generalized): Secondary | ICD-10-CM | POA: Diagnosis not present

## 2022-04-07 DIAGNOSIS — R262 Difficulty in walking, not elsewhere classified: Secondary | ICD-10-CM

## 2022-04-07 DIAGNOSIS — R2681 Unsteadiness on feet: Secondary | ICD-10-CM

## 2022-04-07 DIAGNOSIS — R2689 Other abnormalities of gait and mobility: Secondary | ICD-10-CM

## 2022-04-07 NOTE — Therapy (Signed)
Outpatient Physical Therapy Treatment   Patient Details  Name: Charles Stanton MRN: [id number] Date of Birth: 1955/12/12 Referring Provider (PT): Dr. 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; federal employee secondary    Authorization Time Period 03/01/22-04/26/22    Progress Note Due on Visit 20    PT Start Time 1302    PT Stop Time 1345    PT Time Calculation (min) 43 min    Equipment Utilized During Treatment Gait belt    Activity Tolerance Patient tolerated treatment well;No increased pain    Behavior During Therapy [facility] for tasks assessed/performed                  Past Medical History:  Diagnosis Date   Abnormal liver function    Anemia    Anxiety    Atrial fibrillation ([place])    Cancer ([place]) 2017   skin   Colonic mass    Depression    Dizziness    Dry cough    History of colon polyps    Hypercholesteremia    Hyperlipemia    Hypertension    Palpitations    Parkinson's disease    Parkinson's disease    PVD (peripheral vascular disease) ([place])    Tonsillitis 03/25/2013   Tremors of nervous system     Past Surgical History:  Procedure Laterality Date   BACK SURGERY     Kyphoplasty   March 2018   COLONOSCOPY     COLONOSCOPY WITH PROPOFOL N/A 12/12/2017   Procedure: COLONOSCOPY WITH PROPOFOL;  Surgeon: Jonathon Bellows, MD;  Location: [facility] ENDOSCOPY;  Service: Gastroenterology;  Laterality: N/A;   ESOPHAGOGASTRODUODENOSCOPY N/A 12/11/2017   Procedure: ESOPHAGOGASTRODUODENOSCOPY (EGD);  Surgeon: Jonathon Bellows, MD;  Location: [facility] ENDOSCOPY;  Service: Gastroenterology;  Laterality: N/A;   ESOPHAGOGASTRODUODENOSCOPY N/A 10/08/2021   Procedure: ESOPHAGOGASTRODUODENOSCOPY (EGD);  Surgeon: [name], MD;  Location: [facility] ENDOSCOPY;  Service: Endoscopy;  Laterality: N/A;    ESOPHAGOGASTRODUODENOSCOPY (EGD) WITH PROPOFOL N/A 09/21/2016   Procedure: ESOPHAGOGASTRODUODENOSCOPY (EGD) WITH PROPOFOL;  Surgeon: Jonathon Bellows, MD;  Location: [facility] ENDOSCOPY;  Service: Gastroenterology;  Laterality: N/A;   ESOPHAGOGASTRODUODENOSCOPY (EGD) WITH PROPOFOL N/A 11/07/2016   Procedure: ESOPHAGOGASTRODUODENOSCOPY (EGD) WITH PROPOFOL;  Surgeon: Jonathon Bellows, MD;  Location: [facility] ENDOSCOPY;  Service: Gastroenterology;  Laterality: N/A;   ESOPHAGOGASTRODUODENOSCOPY (EGD) WITH PROPOFOL N/A 10/15/2019   Procedure: ESOPHAGOGASTRODUODENOSCOPY (EGD) WITH PROPOFOL;  Surgeon: [name], MD;  Location: [facility] ENDOSCOPY;  Service: Endoscopy;  Laterality: N/A;   ESOPHAGOGASTRODUODENOSCOPY (EGD) WITH PROPOFOL N/A 10/28/2019   Procedure: ESOPHAGOGASTRODUODENOSCOPY (EGD) WITH PROPOFOL;  Surgeon: [name], MD;  Location: [facility] ENDOSCOPY;  Service: Endoscopy;  Laterality: N/A;   ESOPHAGOGASTRODUODENOSCOPY (EGD) WITH PROPOFOL N/A 12/02/2019   Procedure: ESOPHAGOGASTRODUODENOSCOPY (EGD) WITH PROPOFOL;  Surgeon: [name], MD;  Location: [facility] ENDOSCOPY;  Service: Endoscopy;  Laterality: N/A;   ESOPHAGOGASTRODUODENOSCOPY (EGD) WITH PROPOFOL N/A 02/18/2020   Procedure: ESOPHAGOGASTRODUODENOSCOPY (EGD) WITH PROPOFOL;  Surgeon: [name], MD;  Location: [facility] ENDOSCOPY;  Service: Endoscopy;  Laterality: N/A;   ESOPHAGOGASTRODUODENOSCOPY (EGD) WITH PROPOFOL N/A 08/02/2021   Procedure: ESOPHAGOGASTRODUODENOSCOPY (EGD) WITH PROPOFOL;  Surgeon: [name], MD;  Location: [facility] ENDOSCOPY;  Service: Endoscopy;  Laterality: N/A;  REQUEST AM   ESOPHAGOGASTRODUODENOSCOPY (EGD)  WITH PROPOFOL N/A 10/05/2021   Procedure: ESOPHAGOGASTRODUODENOSCOPY (EGD) WITH PROPOFOL;  Surgeon: [name], MD;  Location: [facility] ENDOSCOPY;  Service: Endoscopy;  Laterality: N/A;   FRACTURE SURGERY Right 05/28/2019   distal radius fracture   HERNIA REPAIR     left inguinal hernia repair as an  infant   KYPHOPLASTY N/A 02/11/2016   Procedure: KYPHOPLASTY  L2,T9;  Surgeon: Hessie Knows, MD;  Location: [facility] ORS;  Service: Orthopedics;  Laterality: N/A;   KYPHOPLASTY N/A 03/15/2016   Procedure: KYPHOPLASTY L3;  Surgeon: Hessie Knows, MD;  Location: [facility] ORS;  Service: Orthopedics;  Laterality: N/A;   OPEN REDUCTION INTERNAL FIXATION (ORIF) DISTAL RADIAL FRACTURE Right 05/28/2019   Procedure: OPEN REDUCTION INTERNAL FIXATION (ORIF) DISTAL RADIAL FRACTURE;  Surgeon: Hessie Knows, MD;  Location: [facility] ORS;  Service: Orthopedics;  Laterality: Right;   skin cancer removed     TONSILLECTOMY       Subjective Assessment -      Subjective Pt reports that he is doing well. No other updates to report.    Pertinent History Elyas Aronhalt is a 59yoM who was prescribed with Parkinson's Disease ~ 10 years.  Pt is followed by neurology, reports good reponse to medications which help control his tremors somewhat. Pt uses a SPC for AMB, also uses a walker for longer distance walking. Pt goes into community almost daily (they like to eat out) which includes lots of SPC [place] eand navigation of 4 stairs at home.    Currently in Pain? No/denies             TREATMENT DATE: 04/07/22 NMR Seated PWR! Up and Rock 2 x 10 ea with cues for hand boosts   TE Dynamic gait training with SPC into rehab gym x  337ft with 2# ankle weights x 2 bouts. With 70 BPM metronome for cue for foot strikes  NMR PWR!  Standing moves with UE assist 2 x 10 in each position with ea UE if indicated; good performance but fatigue with prolonged standing and large movements involved with this. Hand boosts incorporated with PWR! Up, step and rock   Exercise/Activity Sets/ Reps/Time/ Management consultant type Comments  Airex step up anterior 10 x ea   Neuro re-ed   SLS progression 1 LE on floor and one on 6 inch step  3 x 30 sec ea   Neuro re-ed   LAQ  X 12 ea LE   TE   Treatment provided this session   Pt educated throughout  session about proper posture and technique with exercises. Improved exercise technique, movement at target joints, use of target muscles after min to mod verbal, visual, tactile cues. Note: Portions of this document were prepared using Dragon voice recognition software and although reviewed may contain unintentional dictation errors in syntax, grammar, or spelling.        PT Education -     Education Details  Pt educated regarding Life Vac for choking symptoms if needed as he expressed fear of choking with PD.     Person(s) Educated Patient    Methods Explanation;Demonstration ; handout   Comprehension Verbalized understanding;Returned demonstration;Need further instruction              PT Short Term Goals -       PT SHORT TERM GOAL #1   Title Pt to report regular performance of exercises prescribed for home and a sense of improvements in strength and balance AEB them not being as challenging anymore.  Baseline Will issue on visit 2    Time 4    Period Weeks    Status met   Target Date 03/15/22      PT SHORT TERM GOAL #2   Title Pt to demonstrate improved power AEB improved 5xSTS hands free in <18sec.    Baseline 03/01/22: 22sec hands free, author provideing foot block bilat    Time 4    Period Weeks    Status Met    Target Date 03/29/22               PT Long Term Goals -       PT LONG TERM GOAL #1   Title Pt to improve score on FOTO survey to indicated reduced difficulty with basic mobility required for ADL performance.    Baseline 03/01/22:49    Time 8    Period Weeks    Status New    Target Date 04/26/22      PT LONG TERM GOAL #2   Title Pt to improve tolerance to overground AMB c SPC AEB 6MWT performance >852ft.    Baseline 2/27: fatigued after 44ft lap performed in four minutes ten seconds. 03/31/22: 787ft with SPC no rest break   Time 8    Period Weeks    Status progressing   Target Date 04/26/22      PT LONG TERM GOAL #3   Title Pt to  demonstrate improved leg power AEB 5xSTS from chair <14sec hands free and without need for minGuard assist for safety and without need for foot block.    Baseline 03/01/22: 22sec hands free, feet blocked, minGuard assist  03/31/22: 14.82sec   Time 8    Period Weeks    Status Progressing    Target Date 04/26/22      PT LONG TERM GOAL #4   Title Pt to demonstrate improvement in balance performance AEB >6 improvement BERG Balance for balance assessment.    Baseline 41 on 03/03/22. 3/38/24: 42   Time 8    Period Weeks    Status Progressing    Target Date 04/26/22              Plan     Clinical Impression Statement Continued with current plan of care as laid out in evaluation and recent prior sessions. Pt remains motivated to advance progress toward goals in order to maximize independence and safety at home. Pt requires high level assistance and cuing for completion of exercises in order to provide adequate level of stimulation and perturbation. Author allows pt as much opportunity as possible to perform independent righting strategies, only stepping in when pt is unable to prevent falling to floor. Pt requires intermittent rest breaks secondary to fatigue. Pt continues to demonstrate progress toward goals AEB progression of some interventions this date either in volume or intensity.      Personal Factors and Comorbidities Time since onset of injury/illness/exacerbation;Past/Current Experience    Examination-Activity Limitations Locomotion Level;Transfers;Bed Mobility;Reach Overhead;Hygiene/Grooming;Dressing;Toileting;Stairs;Bend    Stability/Clinical Decision Making Evolving/Moderate complexity    Clinical Decision Making Moderate    Rehab Potential Good    PT Frequency 2x / week    PT Duration 8 weeks    PT Treatment/Interventions ADLs/Self Care Home Management;Moist Heat;Gait training;Stair training;Functional mobility training;Therapeutic activities;Therapeutic exercise;Balance  training;Neuromuscular re-education;Patient/family education    PT Next Visit Plan .  STS PWR! Up with eventual addition of PWR! Step. Work on weight shift cues with STS. Dynamic balance/gait training as indicated.  PT Home Exercise Plan 03/01/22: discussed finding and elevated sit surface for future STS exercises (something ~18-19 inches high)    Consulted and Agree with Plan of Care Patient             Patient will benefit from skilled therapeutic intervention in order to improve the following deficits and impairments:  balance, strength, coordination, gait, transfers. Posture. ROM   Visit Diagnosis: Muscle weakness (generalized)  Unsteadiness on feet  Difficulty in walking, not elsewhere classified  Other abnormalities of gait and mobility    Problem List Patient Active Problem List   Diagnosis Date Noted   Acute respiratory failure due to COVID-19 05/28/2019   Alcohol dependence with uncomplicated withdrawal 123456   Alcohol withdrawal 01/23/2018   GIB (gastrointestinal bleeding) 12/09/2017   A-fib 12/09/2017   Esophageal obstruction 10/10/2016   Esophageal ulceration 10/10/2016   GI bleed 09/20/2016   Low vitamin B12 level 03/18/2016   S/P kyphoplasty 03/18/2016   Basal cell carcinoma 02/08/2016   Atrial fibrillation 01/27/2016   Colitis 01/27/2016   HLD (hyperlipidemia) 01/27/2016   Anxiety 01/27/2016   Hypokalemia 01/27/2016   Major depressive disorder, recurrent episode, moderate 04/14/2015   Anxiety, generalized 12/06/2013   Parkinson disease 08/07/2013   Dysphagia 05/12/2013   Esophageal mass 05/12/2013   GERD (gastroesophageal reflux disease) 05/12/2013   Vomiting 05/12/2013   Open bite of lower leg 05/10/2013   Dog bite of lower leg 05/10/2013   Abnormal finding on liver function 03/25/2013   Benign neoplasm 03/25/2013   Dizziness 03/25/2013   Type 2 diabetes mellitus 03/25/2013   BP (high blood pressure) 03/25/2013   Awareness of heartbeats  03/25/2013   Pure hypercholesterolemia 03/25/2013   Infective tonsillitis 03/25/2013   Adenomatous polyp 03/25/2013      Particia Lather, PT 04/07/2022, 1:50 PM  [place] at [facility] [place], [place], [address] Phone: [phone]   Fax:  [phone]

## 2022-04-12 ENCOUNTER — Ambulatory Visit: Payer: Medicare Other | Admitting: Physical Therapy

## 2022-04-12 ENCOUNTER — Encounter: Payer: Self-pay | Admitting: Physical Therapy

## 2022-04-12 DIAGNOSIS — M6281 Muscle weakness (generalized): Secondary | ICD-10-CM

## 2022-04-12 DIAGNOSIS — R262 Difficulty in walking, not elsewhere classified: Secondary | ICD-10-CM

## 2022-04-12 DIAGNOSIS — R2681 Unsteadiness on feet: Secondary | ICD-10-CM

## 2022-04-12 DIAGNOSIS — R2689 Other abnormalities of gait and mobility: Secondary | ICD-10-CM

## 2022-04-12 NOTE — Therapy (Signed)
Outpatient Physical Therapy Treatment   Patient Details  Name: Charles Stanton MRN: [id number] Date of Birth: August 10, 1955 Referring Provider (PT): Dr. 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; federal employee secondary    Authorization Time Period 03/01/22-04/26/22    Progress Note Due on Visit 20    PT Start Time 1302    PT Stop Time 1344    PT Time Calculation (min) 42 min    Equipment Utilized During Treatment Gait belt    Activity Tolerance Patient tolerated treatment well;No increased pain    Behavior During Therapy [facility] for tasks assessed/performed                   Past Medical History:  Diagnosis Date   Abnormal liver function    Anemia    Anxiety    Atrial fibrillation    Cancer 2017   skin   Colonic mass    Depression    Dizziness    Dry cough    History of colon polyps    Hypercholesteremia    Hyperlipemia    Hypertension    Palpitations    Parkinson's disease    Parkinson's disease    PVD (peripheral vascular disease)    Tonsillitis 03/25/2013   Tremors of nervous system     Past Surgical History:  Procedure Laterality Date   BACK SURGERY     Kyphoplasty   March 2018   COLONOSCOPY     COLONOSCOPY WITH PROPOFOL N/A 12/12/2017   Procedure: COLONOSCOPY WITH PROPOFOL;  Surgeon: Wyline Mood, MD;  Location: [facility] ENDOSCOPY;  Service: Gastroenterology;  Laterality: N/A;   ESOPHAGOGASTRODUODENOSCOPY N/A 12/11/2017   Procedure: ESOPHAGOGASTRODUODENOSCOPY (EGD);  Surgeon: Wyline Mood, MD;  Location: [facility] ENDOSCOPY;  Service: Gastroenterology;  Laterality: N/A;   ESOPHAGOGASTRODUODENOSCOPY N/A 10/08/2021   Procedure: ESOPHAGOGASTRODUODENOSCOPY (EGD);  Surgeon: Regis Bill, MD;  Location: [facility] ENDOSCOPY;  Service: Endoscopy;  Laterality: N/A;    ESOPHAGOGASTRODUODENOSCOPY (EGD) WITH PROPOFOL N/A 09/21/2016   Procedure: ESOPHAGOGASTRODUODENOSCOPY (EGD) WITH PROPOFOL;  Surgeon: Wyline Mood, MD;  Location: [facility] ENDOSCOPY;  Service: Gastroenterology;  Laterality: N/A;   ESOPHAGOGASTRODUODENOSCOPY (EGD) WITH PROPOFOL N/A 11/07/2016   Procedure: ESOPHAGOGASTRODUODENOSCOPY (EGD) WITH PROPOFOL;  Surgeon: Wyline Mood, MD;  Location: [facility] ENDOSCOPY;  Service: Gastroenterology;  Laterality: N/A;   ESOPHAGOGASTRODUODENOSCOPY (EGD) WITH PROPOFOL N/A 10/15/2019   Procedure: ESOPHAGOGASTRODUODENOSCOPY (EGD) WITH PROPOFOL;  Surgeon: Regis Bill, MD;  Location: [facility] ENDOSCOPY;  Service: Endoscopy;  Laterality: N/A;   ESOPHAGOGASTRODUODENOSCOPY (EGD) WITH PROPOFOL N/A 10/28/2019   Procedure: ESOPHAGOGASTRODUODENOSCOPY (EGD) WITH PROPOFOL;  Surgeon: Regis Bill, MD;  Location: [facility] ENDOSCOPY;  Service: Endoscopy;  Laterality: N/A;   ESOPHAGOGASTRODUODENOSCOPY (EGD) WITH PROPOFOL N/A 12/02/2019   Procedure: ESOPHAGOGASTRODUODENOSCOPY (EGD) WITH PROPOFOL;  Surgeon: Regis Bill, MD;  Location: [facility] ENDOSCOPY;  Service: Endoscopy;  Laterality: N/A;   ESOPHAGOGASTRODUODENOSCOPY (EGD) WITH PROPOFOL N/A 02/18/2020   Procedure: ESOPHAGOGASTRODUODENOSCOPY (EGD) WITH PROPOFOL;  Surgeon: Regis Bill, MD;  Location: [facility] ENDOSCOPY;  Service: Endoscopy;  Laterality: N/A;   ESOPHAGOGASTRODUODENOSCOPY (EGD) WITH PROPOFOL N/A 08/02/2021   Procedure: ESOPHAGOGASTRODUODENOSCOPY (EGD) WITH PROPOFOL;  Surgeon: Regis Bill, MD;  Location: [facility] ENDOSCOPY;  Service: Endoscopy;  Laterality: N/A;  REQUEST AM   ESOPHAGOGASTRODUODENOSCOPY (EGD) WITH PROPOFOL  N/A 10/05/2021   Procedure: ESOPHAGOGASTRODUODENOSCOPY (EGD) WITH PROPOFOL;  Surgeon: Regis Bill, MD;  Location: [facility] ENDOSCOPY;  Service: Endoscopy;  Laterality: N/A;   FRACTURE SURGERY Right 05/28/2019   distal radius fracture   HERNIA REPAIR     left inguinal hernia repair as an  infant   KYPHOPLASTY N/A 02/11/2016   Procedure: KYPHOPLASTY  L2,T9;  Surgeon: Kennedy Bucker, MD;  Location: [facility] ORS;  Service: Orthopedics;  Laterality: N/A;   KYPHOPLASTY N/A 03/15/2016   Procedure: KYPHOPLASTY L3;  Surgeon: Kennedy Bucker, MD;  Location: [facility] ORS;  Service: Orthopedics;  Laterality: N/A;   OPEN REDUCTION INTERNAL FIXATION (ORIF) DISTAL RADIAL FRACTURE Right 05/28/2019   Procedure: OPEN REDUCTION INTERNAL FIXATION (ORIF) DISTAL RADIAL FRACTURE;  Surgeon: Kennedy Bucker, MD;  Location: [facility] ORS;  Service: Orthopedics;  Laterality: Right;   skin cancer removed     TONSILLECTOMY       Subjective Assessment -      Subjective Pt reports that he is doing well. No other updates to report.    Pertinent History [name] is a 66yoM who was prescribed with Parkinson's Disease ~ 10 years.  Pt is followed by neurology, reports good reponse to medications which help control his tremors somewhat. Pt uses a SPC for AMB, also uses a walker for longer distance walking. Pt goes into community almost daily (they like to eat out) which includes lots of SPC [place] eand navigation of 4 stairs at home.    Currently in Pain? No/denies             TREATMENT DATE: 04/12/22  TE  Sit to stand training with cues for PWR! Up UE and trunk movement pattern  -   TE Dynamic gait training with SPC into rehab gym 2 x  386ft with 2# ankle With cues using cane for auditory for foot contact and SPM   NMR PWR!  Standing moves with UE assist 1 x 10 in each position with ea UE if indicated; good performance but fatigue with prolonged standing and large movements involved with this. Hand boosts incorporated with PWR! Up, step and rock   Exercise/Activity Sets/ Reps/Time/ Personal assistant type Comments  Airex stance nbos 2 x 45 sec   Neuro re-ed Trembling/ tremors  SLS progression 1 LE on floor and one on 6 inch step  2 x 30 sec ea   Neuro re-ed Trembling/ tremors   LAQ  X 12 ea LE with 2# AW   TE    Treatment provided this session   Pt educated throughout session about proper posture and technique with exercises. Improved exercise technique, movement at target joints, use of target muscles after min to mod verbal, visual, tactile cues. Note: Portions of this document were prepared using Dragon voice recognition software and although reviewed may contain unintentional dictation errors in syntax, grammar, or spelling.        PT Education -     Education Details  Pt educated regarding Life Vac for choking symptoms if needed as he expressed fear of choking with PD.     Person(s) Educated Patient    Methods Explanation;Demonstration ; handout   Comprehension Verbalized understanding;Returned demonstration;Need further instruction              PT Short Term Goals -       PT SHORT TERM GOAL #1   Title Pt to report regular performance of exercises prescribed for home and a sense of improvements in strength and balance  AEB them not being as challenging anymore.    Baseline Will issue on visit 2    Time 4    Period Weeks    Status met   Target Date 03/15/22      PT SHORT TERM GOAL #2   Title Pt to demonstrate improved power AEB improved 5xSTS hands free in <18sec.    Baseline 03/01/22: 22sec hands free, author provideing foot block bilat    Time 4    Period Weeks    Status Met    Target Date 03/29/22               PT Long Term Goals -       PT LONG TERM GOAL #1   Title Pt to improve score on FOTO survey to indicated reduced difficulty with basic mobility required for ADL performance.    Baseline 03/01/22:49    Time 8    Period Weeks    Status New    Target Date 04/26/22      PT LONG TERM GOAL #2   Title Pt to improve tolerance to overground AMB c SPC AEB 6MWT performance >84800ft.    Baseline 2/27: fatigued after 44950ft lap performed in four minutes ten seconds. 03/31/22: 73138ft with SPC no rest break   Time 8    Period Weeks    Status progressing   Target  Date 04/26/22      PT LONG TERM GOAL #3   Title Pt to demonstrate improved leg power AEB 5xSTS from chair <14sec hands free and without need for minGuard assist for safety and without need for foot block.    Baseline 03/01/22: 22sec hands free, feet blocked, minGuard assist  03/31/22: 14.82sec   Time 8    Period Weeks    Status Progressing    Target Date 04/26/22      PT LONG TERM GOAL #4   Title Pt to demonstrate improvement in balance performance AEB >6 improvement BERG Balance for balance assessment.    Baseline 41 on 03/03/22. 3/38/24: 42   Time 8    Period Weeks    Status Progressing    Target Date 04/26/22              Plan     Clinical Impression Statement Continued with current plan of care as laid out in evaluation and recent prior sessions. Pt remains motivated to advance progress toward goals in order to maximize independence and safety at home. Pt requires high level assistance and cuing for completion of exercises in order to provide adequate level of stimulation and perturbation. Author allows pt as much opportunity as possible to perform independent righting strategies, only stepping in when pt is unable to prevent falling to floor. Pt requires intermittent rest breaks secondary to fatigue. Pt shows improved cane pattern at times but is still not consistent with foot striking simultaneously with cane. Pt continues to demonstrate progress toward goals AEB progression of some interventions this date either in volume or intensity.      Personal Factors and Comorbidities Time since onset of injury/illness/exacerbation;Past/Current Experience    Examination-Activity Limitations Locomotion Level;Transfers;Bed Mobility;Reach Overhead;Hygiene/Grooming;Dressing;Toileting;Stairs;Bend    Stability/Clinical Decision Making Evolving/Moderate complexity    Clinical Decision Making Moderate    Rehab Potential Good    PT Frequency 2x / week    PT Duration 8 weeks    PT  Treatment/Interventions ADLs/Self Care Home Management;Moist Heat;Gait training;Stair training;Functional mobility training;Therapeutic activities;Therapeutic exercise;Balance training;Neuromuscular re-education;Patient/family education    PT  Next Visit Plan .  STS PWR! Up with eventual addition of PWR! Step. Work on weight shift cues with STS. Dynamic balance/gait training as indicated.    PT Home Exercise Plan 03/01/22: discussed finding and elevated sit surface for future STS exercises (something ~18-19 inches high)    Consulted and Agree with Plan of Care Patient             Patient will benefit from skilled therapeutic intervention in order to improve the following deficits and impairments:  balance, strength, coordination, gait, transfers. Posture. ROM   Visit Diagnosis: Muscle weakness (generalized)  Unsteadiness on feet  Difficulty in walking, not elsewhere classified  Other abnormalities of gait and mobility    Problem List Patient Active Problem List   Diagnosis Date Noted   Acute respiratory failure due to COVID-19 05/28/2019   Alcohol dependence with uncomplicated withdrawal 01/23/2018   Alcohol withdrawal 01/23/2018   GIB (gastrointestinal bleeding) 12/09/2017   A-fib 12/09/2017   Esophageal obstruction 10/10/2016   Esophageal ulceration 10/10/2016   GI bleed 09/20/2016   Low vitamin B12 level 03/18/2016   S/P kyphoplasty 03/18/2016   Basal cell carcinoma 02/08/2016   Atrial fibrillation 01/27/2016   Colitis 01/27/2016   HLD (hyperlipidemia) 01/27/2016   Anxiety 01/27/2016   Hypokalemia 01/27/2016   Major depressive disorder, recurrent episode, moderate 04/14/2015   Anxiety, generalized 12/06/2013   Parkinson disease 08/07/2013   Dysphagia 05/12/2013   Esophageal mass 05/12/2013   GERD (gastroesophageal reflux disease) 05/12/2013   Vomiting 05/12/2013   Open bite of lower leg 05/10/2013   Dog bite of lower leg 05/10/2013   Abnormal finding on liver  function 03/25/2013   Benign neoplasm 03/25/2013   Dizziness 03/25/2013   Type 2 diabetes mellitus 03/25/2013   BP (high blood pressure) 03/25/2013   Awareness of heartbeats 03/25/2013   Pure hypercholesterolemia 03/25/2013   Infective tonsillitis 03/25/2013   Adenomatous polyp 03/25/2013      Norman Herrlich, PT 04/12/2022, 1:08 PM  [facility] at [facility] [address] [place], [place], [address] Phone: [phone]   Fax:  [phone]

## 2022-04-14 ENCOUNTER — Ambulatory Visit: Payer: Medicare Other | Admitting: Physical Therapy

## 2022-04-14 ENCOUNTER — Encounter: Payer: Self-pay | Admitting: Physical Therapy

## 2022-04-14 DIAGNOSIS — R2689 Other abnormalities of gait and mobility: Secondary | ICD-10-CM

## 2022-04-14 DIAGNOSIS — R2681 Unsteadiness on feet: Secondary | ICD-10-CM

## 2022-04-14 DIAGNOSIS — R262 Difficulty in walking, not elsewhere classified: Secondary | ICD-10-CM

## 2022-04-14 DIAGNOSIS — M6281 Muscle weakness (generalized): Secondary | ICD-10-CM | POA: Diagnosis not present

## 2022-04-14 NOTE — Therapy (Signed)
Outpatient Physical Therapy Treatment   Patient Details  Name: Charles Stanton MRN: [id number] Date of Birth: Oct 13, 1955 Referring Provider (PT): Dr. Cristopher Peru   Encounter Date: 04/14/2022  END OF SESSION:    PT End of Session - 04/14/22 1249     Visit Number 14    Number of Visits 16    Date for PT Re-Evaluation 04/26/22    Authorization Type Medicare A & B primary; federal employee secondary    Authorization Time Period 03/01/22-04/26/22    Progress Note Due on Visit 20    PT Start Time 1301    PT Stop Time 1344    PT Time Calculation (min) 43 min    Equipment Utilized During Treatment Gait belt    Activity Tolerance Patient tolerated treatment well;No increased pain    Behavior During Therapy [facility] for tasks assessed/performed                    Past Medical History:  Diagnosis Date   Abnormal liver function    Anemia    Anxiety    Atrial fibrillation    Cancer 2017   skin   Colonic mass    Depression    Dizziness    Dry cough    History of colon polyps    Hypercholesteremia    Hyperlipemia    Hypertension    Palpitations    Parkinson's disease    Parkinson's disease    PVD (peripheral vascular disease)    Tonsillitis 03/25/2013   Tremors of nervous system     Past Surgical History:  Procedure Laterality Date   BACK SURGERY     Kyphoplasty   March 2018   COLONOSCOPY     COLONOSCOPY WITH PROPOFOL N/A 12/12/2017   Procedure: COLONOSCOPY WITH PROPOFOL;  Surgeon: Wyline Mood, MD;  Location: [facility] ENDOSCOPY;  Service: Gastroenterology;  Laterality: N/A;   ESOPHAGOGASTRODUODENOSCOPY N/A 12/11/2017   Procedure: ESOPHAGOGASTRODUODENOSCOPY (EGD);  Surgeon: Wyline Mood, MD;  Location: [facility] ENDOSCOPY;  Service: Gastroenterology;  Laterality: N/A;   ESOPHAGOGASTRODUODENOSCOPY N/A 10/08/2021   Procedure: ESOPHAGOGASTRODUODENOSCOPY (EGD);  Surgeon: Regis Bill, MD;  Location: [facility] ENDOSCOPY;  Service: Endoscopy;  Laterality: N/A;    ESOPHAGOGASTRODUODENOSCOPY (EGD) WITH PROPOFOL N/A 09/21/2016   Procedure: ESOPHAGOGASTRODUODENOSCOPY (EGD) WITH PROPOFOL;  Surgeon: Wyline Mood, MD;  Location: [facility] ENDOSCOPY;  Service: Gastroenterology;  Laterality: N/A;   ESOPHAGOGASTRODUODENOSCOPY (EGD) WITH PROPOFOL N/A 11/07/2016   Procedure: ESOPHAGOGASTRODUODENOSCOPY (EGD) WITH PROPOFOL;  Surgeon: Wyline Mood, MD;  Location: [facility] ENDOSCOPY;  Service: Gastroenterology;  Laterality: N/A;   ESOPHAGOGASTRODUODENOSCOPY (EGD) WITH PROPOFOL N/A 10/15/2019   Procedure: ESOPHAGOGASTRODUODENOSCOPY (EGD) WITH PROPOFOL;  Surgeon: Regis Bill, MD;  Location: [facility] ENDOSCOPY;  Service: Endoscopy;  Laterality: N/A;   ESOPHAGOGASTRODUODENOSCOPY (EGD) WITH PROPOFOL N/A 10/28/2019   Procedure: ESOPHAGOGASTRODUODENOSCOPY (EGD) WITH PROPOFOL;  Surgeon: Regis Bill, MD;  Location: [facility] ENDOSCOPY;  Service: Endoscopy;  Laterality: N/A;   ESOPHAGOGASTRODUODENOSCOPY (EGD) WITH PROPOFOL N/A 12/02/2019   Procedure: ESOPHAGOGASTRODUODENOSCOPY (EGD) WITH PROPOFOL;  Surgeon: Regis Bill, MD;  Location: [facility] ENDOSCOPY;  Service: Endoscopy;  Laterality: N/A;   ESOPHAGOGASTRODUODENOSCOPY (EGD) WITH PROPOFOL N/A 02/18/2020   Procedure: ESOPHAGOGASTRODUODENOSCOPY (EGD) WITH PROPOFOL;  Surgeon: Regis Bill, MD;  Location: [facility] ENDOSCOPY;  Service: Endoscopy;  Laterality: N/A;   ESOPHAGOGASTRODUODENOSCOPY (EGD) WITH PROPOFOL N/A 08/02/2021   Procedure: ESOPHAGOGASTRODUODENOSCOPY (EGD) WITH PROPOFOL;  Surgeon: Regis Bill, MD;  Location: [facility] ENDOSCOPY;  Service: Endoscopy;  Laterality: N/A;  REQUEST AM   ESOPHAGOGASTRODUODENOSCOPY (EGD) WITH  PROPOFOL N/A 10/05/2021   Procedure: ESOPHAGOGASTRODUODENOSCOPY (EGD) WITH PROPOFOL;  Surgeon: Regis Bill, MD;  Location: [facility] ENDOSCOPY;  Service: Endoscopy;  Laterality: N/A;   FRACTURE SURGERY Right 05/28/2019   distal radius fracture   HERNIA REPAIR     left inguinal hernia repair as an  infant   KYPHOPLASTY N/A 02/11/2016   Procedure: KYPHOPLASTY  L2,T9;  Surgeon: Kennedy Bucker, MD;  Location: [facility] ORS;  Service: Orthopedics;  Laterality: N/A;   KYPHOPLASTY N/A 03/15/2016   Procedure: KYPHOPLASTY L3;  Surgeon: Kennedy Bucker, MD;  Location: [facility] ORS;  Service: Orthopedics;  Laterality: N/A;   OPEN REDUCTION INTERNAL FIXATION (ORIF) DISTAL RADIAL FRACTURE Right 05/28/2019   Procedure: OPEN REDUCTION INTERNAL FIXATION (ORIF) DISTAL RADIAL FRACTURE;  Surgeon: Kennedy Bucker, MD;  Location: [facility] ORS;  Service: Orthopedics;  Laterality: Right;   skin cancer removed     TONSILLECTOMY       Subjective Assessment -      Subjective Pt reports that he is doing well. No other updates to report.    Pertinent History Charles Stanton is a 66yoM who was prescribed with Parkinson's Disease ~ 10 years.  Pt is followed by neurology, reports good reponse to medications which help control his tremors somewhat. Pt uses a SPC for AMB, also uses a walker for longer distance walking. Pt goes into community almost daily (they like to eat out) which includes lots of SPC [place] eand navigation of 4 stairs at home.    Currently in Pain? No/denies             TREATMENT DATE: 04/14/22  NMR  PWR! Seated flow from up to rock to twist to step x 10 ea  -visual and verbal fading cues to no cues at end, pt has some difficulty without cues but does complete well.   PWR! Step to R to airex pad 2 x 10 ea LE  -cues for taking large step and cues for positioning for optimal challenge.   TE  Dynamic gait training with SPC into rehab gym  x  494ft with 2# ankle With cues using cane for auditory for foot contact and SPM   Seated rest with x 10 ea LE LAQ with 2 # AW then same as above but 320 feet   NMR  PWR!  Standing moves with UE assist 1 x 10 in each position with ea UE if indicated; good performance but fatigue with prolonged standing and large movements involved with this. Hand boosts incorporated with PWR! Up,  step and rock   Exercise/Activity Sets/ Reps/Time/ Resistance Assistance Charge type Comments  Step taps to metronome at 65 BPM with 2 # AW  2 rounds of 1 min  NMR Difficulty with sequencing, cues for keeping foot on step a moment longer improved task efficacy significantly   Treatment provided this session   Pt educated throughout session about proper posture and technique with exercises. Improved exercise technique, movement at target joints, use of target muscles after min to mod verbal, visual, tactile cues. Note: Portions of this document were prepared using Dragon voice recognition software and although reviewed may contain unintentional dictation errors in syntax, grammar, or spelling.        PT Education -     Education Details  Pt educated regarding Life [employer] for choking symptoms if needed as he expressed fear of choking with PD.     Person(s) Educated Patient    Methods Explanation;Demonstration ; handout   Comprehension Verbalized understanding;Returned demonstration;Need  further instruction              PT Short Term Goals -       PT SHORT TERM GOAL #1   Title Pt to report regular performance of exercises prescribed for home and a sense of improvements in strength and balance AEB them not being as challenging anymore.    Baseline Will issue on visit 2    Time 4    Period Weeks    Status met   Target Date 03/15/22      PT SHORT TERM GOAL #2   Title Pt to demonstrate improved power AEB improved 5xSTS hands free in <18sec.    Baseline 03/01/22: 22sec hands free, author provideing foot block bilat    Time 4    Period Weeks    Status Met    Target Date 03/29/22               PT Long Term Goals -       PT LONG TERM GOAL #1   Title Pt to improve score on FOTO survey to indicated reduced difficulty with basic mobility required for ADL performance.    Baseline 03/01/22:49    Time 8    Period Weeks    Status New    Target Date 04/26/22      PT LONG  TERM GOAL #2   Title Pt to improve tolerance to overground AMB c SPC AEB 6MWT performance >86500ft.    Baseline 2/27: fatigued after 41250ft lap performed in four minutes ten seconds. 03/31/22: 72638ft with SPC no rest break   Time 8    Period Weeks    Status progressing   Target Date 04/26/22      PT LONG TERM GOAL #3   Title Pt to demonstrate improved leg power AEB 5xSTS from chair <14sec hands free and without need for minGuard assist for safety and without need for foot block.    Baseline 03/01/22: 22sec hands free, feet blocked, minGuard assist  03/31/22: 14.82sec   Time 8    Period Weeks    Status Progressing    Target Date 04/26/22      PT LONG TERM GOAL #4   Title Pt to demonstrate improvement in balance performance AEB >6 improvement BERG Balance for balance assessment.    Baseline 41 on 03/03/22. 3/38/24: 42   Time 8    Period Weeks    Status Progressing    Target Date 04/26/22              Plan     Clinical Impression Statement Continued with current plan of care as laid out in evaluation and recent prior sessions. Pt remains motivated to advance progress toward goals in order to maximize independence and safety at home. Pt requires high level assistance and cuing for completion of exercises in order to provide adequate level of stimulation and perturbation. Author allows pt as much opportunity as possible to perform independent righting strategies, only stepping in when pt is unable to prevent falling to floor. Pt requires intermittent rest breaks secondary to fatigue. Pt continues to show improved ambulation pattern with the cane particularly with cues from PT.  Pt continues to demonstrate progress toward goals AEB progression of some interventions this date either in volume or intensity.      Personal Factors and Comorbidities Time since onset of injury/illness/exacerbation;Past/Current Experience    Examination-Activity Limitations Locomotion Level;Transfers;Bed  Mobility;Reach Overhead;Hygiene/Grooming;Dressing;Toileting;Stairs;Bend    Stability/Clinical Decision Making Evolving/Moderate complexity  Clinical Decision Making Moderate    Rehab Potential Good    PT Frequency 2x / week    PT Duration 8 weeks    PT Treatment/Interventions ADLs/Self Care Home Management;Moist Heat;Gait training;Stair training;Functional mobility training;Therapeutic activities;Therapeutic exercise;Balance training;Neuromuscular re-education;Patient/family education    PT Next Visit Plan .  STS PWR! Up with eventual addition of PWR! Step. Work on weight shift cues with STS. Dynamic balance/gait training as indicated.    PT Home Exercise Plan 03/01/22: discussed finding and elevated sit surface for future STS exercises (something ~18-19 inches high)    Consulted and Agree with Plan of Care Patient             Patient will benefit from skilled therapeutic intervention in order to improve the following deficits and impairments:  balance, strength, coordination, gait, transfers. Posture. ROM   Visit Diagnosis: Muscle weakness (generalized)  Unsteadiness on feet  Difficulty in walking, not elsewhere classified  Other abnormalities of gait and mobility    Problem List Patient Active Problem List   Diagnosis Date Noted   Acute respiratory failure due to COVID-19 05/28/2019   Alcohol dependence with uncomplicated withdrawal 01/23/2018   Alcohol withdrawal 01/23/2018   GIB (gastrointestinal bleeding) 12/09/2017   A-fib 12/09/2017   Esophageal obstruction 10/10/2016   Esophageal ulceration 10/10/2016   GI bleed 09/20/2016   Low vitamin B12 level 03/18/2016   S/P kyphoplasty 03/18/2016   Basal cell carcinoma 02/08/2016   Atrial fibrillation 01/27/2016   Colitis 01/27/2016   HLD (hyperlipidemia) 01/27/2016   Anxiety 01/27/2016   Hypokalemia 01/27/2016   Major depressive disorder, recurrent episode, moderate 04/14/2015   Anxiety, generalized 12/06/2013    Parkinson disease 08/07/2013   Dysphagia 05/12/2013   Esophageal mass 05/12/2013   GERD (gastroesophageal reflux disease) 05/12/2013   Vomiting 05/12/2013   Open bite of lower leg 05/10/2013   Dog bite of lower leg 05/10/2013   Abnormal finding on liver function 03/25/2013   Benign neoplasm 03/25/2013   Dizziness 03/25/2013   Type 2 diabetes mellitus 03/25/2013   BP (high blood pressure) 03/25/2013   Awareness of heartbeats 03/25/2013   Pure hypercholesterolemia 03/25/2013   Infective tonsillitis 03/25/2013   Adenomatous polyp 03/25/2013      Norman Herrlich, PT 04/14/2022, 1:09 PM  [facility] [address] [place], [place], [address] Phone: [phone]   Fax:  [phone]

## 2022-04-19 ENCOUNTER — Ambulatory Visit: Payer: Medicare Other | Admitting: Physical Therapy

## 2022-04-19 DIAGNOSIS — R262 Difficulty in walking, not elsewhere classified: Secondary | ICD-10-CM

## 2022-04-19 DIAGNOSIS — M6281 Muscle weakness (generalized): Secondary | ICD-10-CM

## 2022-04-19 DIAGNOSIS — R2681 Unsteadiness on feet: Secondary | ICD-10-CM

## 2022-04-19 NOTE — Therapy (Signed)
Outpatient Physical Therapy Treatment   Patient Details  Name: Charles Stanton MRN: [id number] Date of Birth: Oct 11, 1955 Referring Provider (PT): Dr. 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; federal employee secondary    Authorization Time Period 03/01/22-04/26/22    Progress Note Due on Visit 20    Equipment Utilized During Treatment Gait belt    Activity Tolerance Patient tolerated treatment well;No increased pain    Behavior During Therapy [facility] for tasks assessed/performed                     Past Medical History:  Diagnosis Date   Abnormal liver function    Anemia    Anxiety    Atrial fibrillation    Cancer 2017   skin   Colonic mass    Depression    Dizziness    Dry cough    History of colon polyps    Hypercholesteremia    Hyperlipemia    Hypertension    Palpitations    Parkinson's disease    Parkinson's disease    PVD (peripheral vascular disease)    Tonsillitis 03/25/2013   Tremors of nervous system     Past Surgical History:  Procedure Laterality Date   BACK SURGERY     Kyphoplasty   March 2018   COLONOSCOPY     COLONOSCOPY WITH PROPOFOL N/A 12/12/2017   Procedure: COLONOSCOPY WITH PROPOFOL;  Surgeon: Wyline Mood, MD;  Location: [facility] ENDOSCOPY;  Service: Gastroenterology;  Laterality: N/A;   ESOPHAGOGASTRODUODENOSCOPY N/A 12/11/2017   Procedure: ESOPHAGOGASTRODUODENOSCOPY (EGD);  Surgeon: Wyline Mood, MD;  Location: [facility] ENDOSCOPY;  Service: Gastroenterology;  Laterality: N/A;   ESOPHAGOGASTRODUODENOSCOPY N/A 10/08/2021   Procedure: ESOPHAGOGASTRODUODENOSCOPY (EGD);  Surgeon: Regis Bill, MD;  Location: [facility] ENDOSCOPY;  Service: Endoscopy;  Laterality: N/A;   ESOPHAGOGASTRODUODENOSCOPY (EGD) WITH PROPOFOL N/A 09/21/2016   Procedure:  ESOPHAGOGASTRODUODENOSCOPY (EGD) WITH PROPOFOL;  Surgeon: Wyline Mood, MD;  Location: [facility] ENDOSCOPY;  Service: Gastroenterology;  Laterality: N/A;   ESOPHAGOGASTRODUODENOSCOPY (EGD) WITH PROPOFOL N/A 11/07/2016   Procedure: ESOPHAGOGASTRODUODENOSCOPY (EGD) WITH PROPOFOL;  Surgeon: Wyline Mood, MD;  Location: [facility] ENDOSCOPY;  Service: Gastroenterology;  Laterality: N/A;   ESOPHAGOGASTRODUODENOSCOPY (EGD) WITH PROPOFOL N/A 10/15/2019   Procedure: ESOPHAGOGASTRODUODENOSCOPY (EGD) WITH PROPOFOL;  Surgeon: Regis Bill, MD;  Location: [facility] ENDOSCOPY;  Service: Endoscopy;  Laterality: N/A;   ESOPHAGOGASTRODUODENOSCOPY (EGD) WITH PROPOFOL N/A 10/28/2019   Procedure: ESOPHAGOGASTRODUODENOSCOPY (EGD) WITH PROPOFOL;  Surgeon: Regis Bill, MD;  Location: [facility] ENDOSCOPY;  Service: Endoscopy;  Laterality: N/A;   ESOPHAGOGASTRODUODENOSCOPY (EGD) WITH PROPOFOL N/A 12/02/2019   Procedure: ESOPHAGOGASTRODUODENOSCOPY (EGD) WITH PROPOFOL;  Surgeon: Regis Bill, MD;  Location: [facility] ENDOSCOPY;  Service: Endoscopy;  Laterality: N/A;   ESOPHAGOGASTRODUODENOSCOPY (EGD) WITH PROPOFOL N/A 02/18/2020   Procedure: ESOPHAGOGASTRODUODENOSCOPY (EGD) WITH PROPOFOL;  Surgeon: Regis Bill, MD;  Location: [facility] ENDOSCOPY;  Service: Endoscopy;  Laterality: N/A;   ESOPHAGOGASTRODUODENOSCOPY (EGD) WITH PROPOFOL N/A 08/02/2021   Procedure: ESOPHAGOGASTRODUODENOSCOPY (EGD) WITH PROPOFOL;  Surgeon: Regis Bill, MD;  Location: [facility] ENDOSCOPY;  Service: Endoscopy;  Laterality: N/A;  REQUEST AM   ESOPHAGOGASTRODUODENOSCOPY (EGD) WITH PROPOFOL N/A 10/05/2021   Procedure: ESOPHAGOGASTRODUODENOSCOPY (EGD) WITH PROPOFOL;  Surgeon: Regis Bill, MD;  Location: [facility] ENDOSCOPY;  Service:  Endoscopy;  Laterality: N/A;   FRACTURE SURGERY Right 05/28/2019   distal radius fracture   HERNIA REPAIR     left inguinal hernia repair as an infant   KYPHOPLASTY N/A 02/11/2016   Procedure: KYPHOPLASTY  L2,T9;  Surgeon:  Kennedy Bucker, MD;  Location: [facility] ORS;  Service: Orthopedics;  Laterality: N/A;   KYPHOPLASTY N/A 03/15/2016   Procedure: KYPHOPLASTY L3;  Surgeon: Kennedy Bucker, MD;  Location: [facility] ORS;  Service: Orthopedics;  Laterality: N/A;   OPEN REDUCTION INTERNAL FIXATION (ORIF) DISTAL RADIAL FRACTURE Right 05/28/2019   Procedure: OPEN REDUCTION INTERNAL FIXATION (ORIF) DISTAL RADIAL FRACTURE;  Surgeon: Kennedy Bucker, MD;  Location: [facility] ORS;  Service: Orthopedics;  Laterality: Right;   skin cancer removed     TONSILLECTOMY       Subjective Assessment -      Subjective Pt reports he had a birthday party and it was fun, it was for his grand DTR. Pt is going to soccer game Wednesday.    Pertinent History Vernice Bowker is a 66yoM who was prescribed with Parkinson's Disease ~ 10 years.  Pt is followed by neurology, reports good reponse to medications which help control his tremors somewhat. Pt uses a SPC for AMB, also uses a walker for longer distance walking. Pt goes into community almost daily (they like to eat out) which includes lots of SPC [place] eand navigation of 4 stairs at home.    Currently in Pain? No/denies             TREATMENT DATE: 04/19/22  NMR  PWR! Seated flow from up to rock to twist to step x 10 ea  -visual fading cues to only verbal cues on last rep for which exercise to flow to.   STS then PWR! Step in standing to each side then repeat sitting x 10 total reps   PWR! Rock 2 x 10 ea LE  -cues for taking large step and cues for positioning for optimal challenge.   Ambulation practice with focus on cane sequencing adding cone weaving every 5 feet for 40 feet duration  X multiple reps   TE  Dynamic gait training with SPC into rehab gym x  422ft with 2.5# ankle With cues using cane for auditory cue/ cadence for foot contact and SPM   LAQ - 3 x 10 with 2.5#   NMR  PWR!  Standing moves with UE assist 1 x 10 in each position good performance but fatigue with prolonged standing and  large movements involved with this. Hand boosts incorporated with PWR! Up, step and rock  -modified rock and step with airex, modified up with increased speed   Note: Portions of this document were prepared using Dragon voice recognition software and although reviewed may contain unintentional dictation errors in syntax, grammar, or spelling.        PT Education -     Education Details  Pt educated regarding Life Vac for choking symptoms if needed as he expressed fear of choking with PD.     Person(s) Educated Patient    Methods Explanation;Demonstration ; handout   Comprehension Verbalized understanding;Returned demonstration;Need further instruction              PT Short Term Goals -       PT SHORT TERM GOAL #1   Title Pt to report regular performance of exercises prescribed for home and a sense of improvements in strength and balance AEB them not being as challenging anymore.    Baseline  Will issue on visit 2    Time 4    Period Weeks    Status met   Target Date 03/15/22      PT SHORT TERM GOAL #2   Title Pt to demonstrate improved power AEB improved 5xSTS hands free in <18sec.    Baseline 03/01/22: 22sec hands free, author provideing foot block bilat    Time 4    Period Weeks    Status Met    Target Date 03/29/22               PT Long Term Goals -       PT LONG TERM GOAL #1   Title Pt to improve score on FOTO survey to indicated reduced difficulty with basic mobility required for ADL performance.    Baseline 03/01/22:49    Time 8    Period Weeks    Status New    Target Date 04/26/22      PT LONG TERM GOAL #2   Title Pt to improve tolerance to overground AMB c SPC AEB performance >897ft.    Baseline 2/27: fatigued after 443ft lap performed in four minutes ten seconds. 03/31/22: 771ft with SPC no rest break   Time 8    Period Weeks    Status progressing   Target Date 04/26/22      PT LONG TERM GOAL #3   Title Pt to demonstrate improved leg  power AEB 5xSTS from chair <14sec hands free and without need for minGuard assist for safety and without need for foot block.    Baseline 03/01/22: 22sec hands free, feet blocked, minGuard assist  03/31/22: 14.82sec   Time 8    Period Weeks    Status Progressing    Target Date 04/26/22      PT LONG TERM GOAL #4   Title Pt to demonstrate improvement in balance performance AEB >6 improvement BERG Balance for balance assessment.    Baseline 41 on 03/03/22. 3/38/24: 42   Time 8    Period Weeks    Status Progressing    Target Date 04/26/22              Plan     Clinical Impression Statement Continued with current plan of care as laid out in evaluation and recent prior sessions. Pt remains motivated to advance progress toward goals in order to maximize independence and safety at home. Pt requires high level assistance and cuing for completion of exercises in order to provide adequate level of stimulation and perturbation. Author allows pt as much opportunity as possible to perform independent righting strategies, only stepping in when pt is unable to prevent falling to floor. Pt requires intermittent rest breaks secondary to fatigue. Pt continues to show improved ambulation pattern with the cane particularly with cues from PT and shows improvements in cane navigation with good pattern with obstacles.  Pt continues to demonstrate progress toward goals AEB progression of some interventions this date either in volume or intensity.      Personal Factors and Comorbidities Time since onset of injury/illness/exacerbation;Past/Current Experience    Examination-Activity Limitations Locomotion Level;Transfers;Bed Mobility;Reach Overhead;Hygiene/Grooming;Dressing;Toileting;Stairs;Bend    Stability/Clinical Decision Making Evolving/Moderate complexity    Clinical Decision Making Moderate    Rehab Potential Good    PT Frequency 2x / week    PT Duration 8 weeks    PT Treatment/Interventions ADLs/Self Care  Home Management;Moist Heat;Gait training;Stair training;Functional mobility training;Therapeutic activities;Therapeutic exercise;Balance training;Neuromuscular re-education;Patient/family education    PT Next Visit  Plan .  STS PWR! Up with eventual addition of PWR! Step. Work on weight shift cues with STS. Dynamic balance/gait training as indicated.    PT Home Exercise Plan 03/01/22: discussed finding and elevated sit surface for future STS exercises (something ~18-19 inches high)    Consulted and Agree with Plan of Care Patient             Patient will benefit from skilled therapeutic intervention in order to improve the following deficits and impairments:  balance, strength, coordination, gait, transfers. Posture. ROM   Visit Diagnosis: Muscle weakness (generalized)  Unsteadiness on feet  Difficulty in walking, not elsewhere classified    Problem List Patient Active Problem List   Diagnosis Date Noted   Acute respiratory failure due to COVID-19 05/28/2019   Alcohol dependence with uncomplicated withdrawal 01/23/2018   Alcohol withdrawal 01/23/2018   GIB (gastrointestinal bleeding) 12/09/2017   A-fib 12/09/2017   Esophageal obstruction 10/10/2016   Esophageal ulceration 10/10/2016   GI bleed 09/20/2016   Low vitamin B12 level 03/18/2016   S/P kyphoplasty 03/18/2016   Basal cell carcinoma 02/08/2016   Atrial fibrillation 01/27/2016   Colitis 01/27/2016   HLD (hyperlipidemia) 01/27/2016   Anxiety 01/27/2016   Hypokalemia 01/27/2016   Major depressive disorder, recurrent episode, moderate 04/14/2015   Anxiety, generalized 12/06/2013   Parkinson disease 08/07/2013   Dysphagia 05/12/2013   Esophageal mass 05/12/2013   GERD (gastroesophageal reflux disease) 05/12/2013   Vomiting 05/12/2013   Open bite of lower leg 05/10/2013   Dog bite of lower leg 05/10/2013   Abnormal finding on liver function 03/25/2013   Benign neoplasm 03/25/2013   Dizziness 03/25/2013   Type 2  diabetes mellitus 03/25/2013   BP (high blood pressure) 03/25/2013   Awareness of heartbeats 03/25/2013   Pure hypercholesterolemia 03/25/2013   Infective tonsillitis 03/25/2013   Adenomatous polyp 03/25/2013      Norman Herrlich, PT 04/19/2022, 1:00 PM  [facility] Outpatient Rehabilitation at [facility] [address] [place], [place], [address] Phone: [phone]   Fax:  [phone]

## 2022-04-21 ENCOUNTER — Ambulatory Visit: Payer: Medicare Other | Admitting: Physical Therapy

## 2022-04-21 ENCOUNTER — Encounter: Payer: Self-pay | Admitting: Physical Therapy

## 2022-04-21 DIAGNOSIS — M6281 Muscle weakness (generalized): Secondary | ICD-10-CM

## 2022-04-21 DIAGNOSIS — R262 Difficulty in walking, not elsewhere classified: Secondary | ICD-10-CM

## 2022-04-21 DIAGNOSIS — R2681 Unsteadiness on feet: Secondary | ICD-10-CM

## 2022-04-21 DIAGNOSIS — R2689 Other abnormalities of gait and mobility: Secondary | ICD-10-CM

## 2022-04-21 NOTE — Therapy (Signed)
Outpatient Physical Therapy Treatment   Patient Details  Name: Charles Stanton MRN: [id number] Date of Birth: May 14, 1955 Referring Provider (PT): Dr. 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; federal employee secondary    Authorization Time Period 03/01/22-04/26/22    Progress Note Due on Visit 20    PT Start Time 1302    PT Stop Time 1344    PT Time Calculation (min) 42 min    Equipment Utilized During Treatment Gait belt    Activity Tolerance Patient tolerated treatment well;No increased pain    Behavior During Therapy [facility] for tasks assessed/performed                      Past Medical History:  Diagnosis Date   Abnormal liver function    Anemia    Anxiety    Atrial fibrillation    Cancer 2017   skin   Colonic mass    Depression    Dizziness    Dry cough    History of colon polyps    Hypercholesteremia    Hyperlipemia    Hypertension    Palpitations    Parkinson's disease    Parkinson's disease    PVD (peripheral vascular disease)    Tonsillitis 03/25/2013   Tremors of nervous system     Past Surgical History:  Procedure Laterality Date   BACK SURGERY     Kyphoplasty   March 2018   COLONOSCOPY     COLONOSCOPY WITH PROPOFOL N/A 12/12/2017   Procedure: COLONOSCOPY WITH PROPOFOL;  Surgeon: Charles Mood, MD;  Location: [facility] ENDOSCOPY;  Service: Gastroenterology;  Laterality: N/A;   ESOPHAGOGASTRODUODENOSCOPY N/A 12/11/2017   Procedure: ESOPHAGOGASTRODUODENOSCOPY (EGD);  Surgeon: Charles Mood, MD;  Location: [facility] ENDOSCOPY;  Service: Gastroenterology;  Laterality: N/A;   ESOPHAGOGASTRODUODENOSCOPY N/A 10/08/2021   Procedure: ESOPHAGOGASTRODUODENOSCOPY (EGD);  Surgeon: Charles Bill, MD;  Location: [facility] ENDOSCOPY;  Service: Endoscopy;  Laterality: N/A;    ESOPHAGOGASTRODUODENOSCOPY (EGD) WITH PROPOFOL N/A 09/21/2016   Procedure: ESOPHAGOGASTRODUODENOSCOPY (EGD) WITH PROPOFOL;  Surgeon: Charles Mood, MD;  Location: [facility] ENDOSCOPY;  Service: Gastroenterology;  Laterality: N/A;   ESOPHAGOGASTRODUODENOSCOPY (EGD) WITH PROPOFOL N/A 11/07/2016   Procedure: ESOPHAGOGASTRODUODENOSCOPY (EGD) WITH PROPOFOL;  Surgeon: Charles Mood, MD;  Location: [facility] ENDOSCOPY;  Service: Gastroenterology;  Laterality: N/A;   ESOPHAGOGASTRODUODENOSCOPY (EGD) WITH PROPOFOL N/A 10/15/2019   Procedure: ESOPHAGOGASTRODUODENOSCOPY (EGD) WITH PROPOFOL;  Surgeon: Charles Bill, MD;  Location: [facility] ENDOSCOPY;  Service: Endoscopy;  Laterality: N/A;   ESOPHAGOGASTRODUODENOSCOPY (EGD) WITH PROPOFOL N/A 10/28/2019   Procedure: ESOPHAGOGASTRODUODENOSCOPY (EGD) WITH PROPOFOL;  Surgeon: Charles Bill, MD;  Location: [facility] ENDOSCOPY;  Service: Endoscopy;  Laterality: N/A;   ESOPHAGOGASTRODUODENOSCOPY (EGD) WITH PROPOFOL N/A 12/02/2019   Procedure: ESOPHAGOGASTRODUODENOSCOPY (EGD) WITH PROPOFOL;  Surgeon: Charles Bill, MD;  Location: [facility] ENDOSCOPY;  Service: Endoscopy;  Laterality: N/A;   ESOPHAGOGASTRODUODENOSCOPY (EGD) WITH PROPOFOL N/A 02/18/2020   Procedure: ESOPHAGOGASTRODUODENOSCOPY (EGD) WITH PROPOFOL;  Surgeon: Charles Bill, MD;  Location: [facility] ENDOSCOPY;  Service: Endoscopy;  Laterality: N/A;   ESOPHAGOGASTRODUODENOSCOPY (EGD) WITH PROPOFOL N/A 08/02/2021   Procedure: ESOPHAGOGASTRODUODENOSCOPY (EGD) WITH PROPOFOL;  Surgeon: Charles Bill, MD;  Location: [facility] ENDOSCOPY;  Service: Endoscopy;  Laterality: N/A;  REQUEST AM   ESOPHAGOGASTRODUODENOSCOPY (  EGD) WITH PROPOFOL N/A 10/05/2021   Procedure: ESOPHAGOGASTRODUODENOSCOPY (EGD) WITH PROPOFOL;  Surgeon: Charles Bill, MD;  Location: [facility] ENDOSCOPY;  Service: Endoscopy;  Laterality: N/A;   FRACTURE SURGERY Right 05/28/2019   distal radius fracture   HERNIA REPAIR     left inguinal hernia repair as an  infant   KYPHOPLASTY N/A 02/11/2016   Procedure: KYPHOPLASTY  L2,T9;  Surgeon: Charles Bucker, MD;  Location: [facility] ORS;  Service: Orthopedics;  Laterality: N/A;   KYPHOPLASTY N/A 03/15/2016   Procedure: KYPHOPLASTY L3;  Surgeon: Charles Bucker, MD;  Location: [facility] ORS;  Service: Orthopedics;  Laterality: N/A;   OPEN REDUCTION INTERNAL FIXATION (ORIF) DISTAL RADIAL FRACTURE Right 05/28/2019   Procedure: OPEN REDUCTION INTERNAL FIXATION (ORIF) DISTAL RADIAL FRACTURE;  Surgeon: Charles Bucker, MD;  Location: [facility] ORS;  Service: Orthopedics;  Laterality: Right;   skin cancer removed     TONSILLECTOMY       Subjective Assessment -      Subjective Pt reports he was able to access his [employer] soccer game with some assistance of AD and family last night and he was happy about it.    Pertinent History Charles Stanton is a 66yoM who was prescribed with Parkinson's Disease ~ 10 years.  Pt is followed by neurology, reports good reponse to medications which help control his tremors somewhat. Pt uses a SPC for AMB, also uses a walker for longer distance walking. Pt goes into community almost daily (they like to eat out) which includes lots of SPC [place] eand navigation of 4 stairs at home.    Currently in Pain? No/denies             TREATMENT DATE: 04/21/22  NMR  PWR! Seated flow  feedback on last rep, pt has difficulty with differentiation between   STS then PWR! Step in standing to each side then repeat sitting x 10 total reps, cues for forward weight shift   Transition to supine on Mat table to perform supine PWR! Exercises 2 x 10 of each  - cues for correct muscle activation without compensation   Prone PR! Up and PWR! Twist x 10 ea  - handout provided for completion at home.   PWR!  Standing reach 1 x 10 with reach while standing on airex to further challenge weigh shifting  TE  Dynamic gait training with SPC into rehab gym x  477ft with 2.5# ankle With cues using cane for auditory cue/ cadence  for foot contact and SPM      Note: Portions of this document were prepared using Dragon voice recognition software and although reviewed may contain unintentional dictation errors in syntax, grammar, or spelling.        PT Education -     Education Details  Pt educated regarding Life Vac for choking symptoms if needed as he expressed fear of choking with PD.     Person(s) Educated Patient    Methods Explanation;Demonstration ; handout   Comprehension Verbalized understanding;Returned demonstration;Need further instruction              PT Short Term Goals -       PT SHORT TERM GOAL #1   Title Pt to report regular performance of exercises prescribed for home and a sense of improvements in strength and balance AEB them not being as challenging anymore.    Baseline Will issue on visit 2    Time 4    Period Weeks    Status met  Target Date 03/15/22      PT SHORT TERM GOAL #2   Title Pt to demonstrate improved power AEB improved 5xSTS hands free in <18sec.    Baseline 03/01/22: 22sec hands free, author provideing foot block bilat    Time 4    Period Weeks    Status Met    Target Date 03/29/22               PT Long Term Goals -       PT LONG TERM GOAL #1   Title Pt to improve score on FOTO survey to indicated reduced difficulty with basic mobility required for ADL performance.    Baseline 03/01/22:49    Time 8    Period Weeks    Status New    Target Date 04/26/22      PT LONG TERM GOAL #2   Title Pt to improve tolerance to overground AMB c SPC AEB performance >824ft.    Baseline 2/27: fatigued after 417ft lap performed in four minutes ten seconds. 03/31/22: 755ft with SPC no rest break   Time 8    Period Weeks    Status progressing   Target Date 04/26/22      PT LONG TERM GOAL #3   Title Pt to demonstrate improved leg power AEB 5xSTS from chair <14sec hands free and without need for minGuard assist for safety and without need for foot block.     Baseline 03/01/22: 22sec hands free, feet blocked, minGuard assist  03/31/22: 14.82sec   Time 8    Period Weeks    Status Progressing    Target Date 04/26/22      PT LONG TERM GOAL #4   Title Pt to demonstrate improvement in balance performance AEB >6 improvement BERG Balance for balance assessment.    Baseline 41 on 03/03/22. 3/38/24: 42   Time 8    Period Weeks    Status Progressing    Target Date 04/26/22              Plan     Clinical Impression Statement Patient continues to demonstrate excellent motivation for completion of physical therapy activities.  Patient progresses with Parkinson's wellness recovery AKA PWR exercises this date practicing more with supine and prone activities that he consider addition to completing at home on his bed to work on bed mobility as well as truncal and axial strength and mobility.Pt will continue to benefit from skilled physical therapy intervention to address impairments, improve QOL, and attain therapy goals.       Personal Factors and Comorbidities Time since onset of injury/illness/exacerbation;Past/Current Experience    Examination-Activity Limitations Locomotion Level;Transfers;Bed Mobility;Reach Overhead;Hygiene/Grooming;Dressing;Toileting;Stairs;Bend    Stability/Clinical Decision Making Evolving/Moderate complexity    Clinical Decision Making Moderate    Rehab Potential Good    PT Frequency 2x / week    PT Duration 8 weeks    PT Treatment/Interventions ADLs/Self Care Home Management;Moist Heat;Gait training;Stair training;Functional mobility training;Therapeutic activities;Therapeutic exercise;Balance training;Neuromuscular re-education;Patient/family education    PT Next Visit Plan .  STS PWR! Up with eventual addition of PWR! Step. Work on weight shift cues with STS. Dynamic balance/gait training as indicated.    PT Home Exercise Plan 03/01/22: discussed finding and elevated sit surface for future STS exercises (something ~18-19 inches  high)    Consulted and Agree with Plan of Care Patient             Patient will benefit from skilled therapeutic intervention in order to  improve the following deficits and impairments:  balance, strength, coordination, gait, transfers. Posture. ROM   Visit Diagnosis: No diagnosis found.    Problem List Patient Active Problem List   Diagnosis Date Noted   Acute respiratory failure due to COVID-19 05/28/2019   Alcohol dependence with uncomplicated withdrawal 01/23/2018   Alcohol withdrawal 01/23/2018   GIB (gastrointestinal bleeding) 12/09/2017   A-fib 12/09/2017   Esophageal obstruction 10/10/2016   Esophageal ulceration 10/10/2016   GI bleed 09/20/2016   Low vitamin B12 level 03/18/2016   S/P kyphoplasty 03/18/2016   Basal cell carcinoma 02/08/2016   Atrial fibrillation 01/27/2016   Colitis 01/27/2016   HLD (hyperlipidemia) 01/27/2016   Anxiety 01/27/2016   Hypokalemia 01/27/2016   Major depressive disorder, recurrent episode, moderate 04/14/2015   Anxiety, generalized 12/06/2013   Parkinson disease 08/07/2013   Dysphagia 05/12/2013   Esophageal mass 05/12/2013   GERD (gastroesophageal reflux disease) 05/12/2013   Vomiting 05/12/2013   Open bite of lower leg 05/10/2013   Dog bite of lower leg 05/10/2013   Abnormal finding on liver function 03/25/2013   Benign neoplasm 03/25/2013   Dizziness 03/25/2013   Type 2 diabetes mellitus 03/25/2013   BP (high blood pressure) 03/25/2013   Awareness of heartbeats 03/25/2013   Pure hypercholesterolemia 03/25/2013   Infective tonsillitis 03/25/2013   Adenomatous polyp 03/25/2013      Norman Herrlich, PT 04/21/2022, 1:39 PM  [facility] Outpatient Rehabilitation at [facility] [address] [place], [place], [address] Phone: [phone]   Fax:  [phone]

## 2022-04-26 ENCOUNTER — Ambulatory Visit: Payer: Medicare Other | Admitting: Physical Therapy

## 2022-04-26 DIAGNOSIS — R2681 Unsteadiness on feet: Secondary | ICD-10-CM

## 2022-04-26 DIAGNOSIS — R262 Difficulty in walking, not elsewhere classified: Secondary | ICD-10-CM

## 2022-04-26 DIAGNOSIS — R2689 Other abnormalities of gait and mobility: Secondary | ICD-10-CM

## 2022-04-26 DIAGNOSIS — M6281 Muscle weakness (generalized): Secondary | ICD-10-CM | POA: Diagnosis not present

## 2022-04-26 NOTE — Therapy (Signed)
Outpatient Physical Therapy Treatment/ [facility]     Patient Details  Name: Charles Stanton MRN: [id number] Date of Birth: 13-May-1955 Referring Provider (PT): Dr. Cristopher Peru   Encounter Date: 04/26/2022  END OF SESSION:    PT End of Session - 04/26/22 1304     Visit Number 17    Number of Visits 40    Date for PT Re-Evaluation 04/26/22    Authorization Type Medicare A & B primary; federal employee secondary    Authorization Time Period 03/01/22-04/26/22    Progress Note Due on Visit 20    PT Start Time 1302    PT Stop Time 1344    PT Time Calculation (min) 42 min    Equipment Utilized During Treatment Gait belt    Activity Tolerance Patient tolerated treatment well;No increased pain    Behavior During Therapy [facility] for tasks assessed/performed                       Past Medical History:  Diagnosis Date   Abnormal liver function    Anemia    Anxiety    Atrial fibrillation    Cancer 2017   skin   Colonic mass    Depression    Dizziness    Dry cough    History of colon polyps    Hypercholesteremia    Hyperlipemia    Hypertension    Palpitations    Parkinson's disease    Parkinson's disease    PVD (peripheral vascular disease)    Tonsillitis 03/25/2013   Tremors of nervous system     Past Surgical History:  Procedure Laterality Date   BACK SURGERY     Kyphoplasty   March 2018   COLONOSCOPY     COLONOSCOPY WITH PROPOFOL N/A 12/12/2017   Procedure: COLONOSCOPY WITH PROPOFOL;  Surgeon: Wyline Mood, MD;  Location: [facility] ENDOSCOPY;  Service: Gastroenterology;  Laterality: N/A;   ESOPHAGOGASTRODUODENOSCOPY N/A 12/11/2017   Procedure: ESOPHAGOGASTRODUODENOSCOPY (EGD);  Surgeon: Wyline Mood, MD;  Location: [facility] ENDOSCOPY;  Service: Gastroenterology;  Laterality: N/A;   ESOPHAGOGASTRODUODENOSCOPY N/A 10/08/2021   Procedure: ESOPHAGOGASTRODUODENOSCOPY (EGD);  Surgeon: Regis Bill, MD;  Location: [facility] ENDOSCOPY;  Service: Endoscopy;  Laterality: N/A;    ESOPHAGOGASTRODUODENOSCOPY (EGD) WITH PROPOFOL N/A 09/21/2016   Procedure: ESOPHAGOGASTRODUODENOSCOPY (EGD) WITH PROPOFOL;  Surgeon: Wyline Mood, MD;  Location: [facility] ENDOSCOPY;  Service: Gastroenterology;  Laterality: N/A;   ESOPHAGOGASTRODUODENOSCOPY (EGD) WITH PROPOFOL N/A 11/07/2016   Procedure: ESOPHAGOGASTRODUODENOSCOPY (EGD) WITH PROPOFOL;  Surgeon: Wyline Mood, MD;  Location: [facility] ENDOSCOPY;  Service: Gastroenterology;  Laterality: N/A;   ESOPHAGOGASTRODUODENOSCOPY (EGD) WITH PROPOFOL N/A 10/15/2019   Procedure: ESOPHAGOGASTRODUODENOSCOPY (EGD) WITH PROPOFOL;  Surgeon: Regis Bill, MD;  Location: [facility] ENDOSCOPY;  Service: Endoscopy;  Laterality: N/A;   ESOPHAGOGASTRODUODENOSCOPY (EGD) WITH PROPOFOL N/A 10/28/2019   Procedure: ESOPHAGOGASTRODUODENOSCOPY (EGD) WITH PROPOFOL;  Surgeon: Regis Bill, MD;  Location: [facility] ENDOSCOPY;  Service: Endoscopy;  Laterality: N/A;   ESOPHAGOGASTRODUODENOSCOPY (EGD) WITH PROPOFOL N/A 12/02/2019   Procedure: ESOPHAGOGASTRODUODENOSCOPY (EGD) WITH PROPOFOL;  Surgeon: Regis Bill, MD;  Location: [facility] ENDOSCOPY;  Service: Endoscopy;  Laterality: N/A;   ESOPHAGOGASTRODUODENOSCOPY (EGD) WITH PROPOFOL N/A 02/18/2020   Procedure: ESOPHAGOGASTRODUODENOSCOPY (EGD) WITH PROPOFOL;  Surgeon: Regis Bill, MD;  Location: [facility] ENDOSCOPY;  Service: Endoscopy;  Laterality: N/A;   ESOPHAGOGASTRODUODENOSCOPY (EGD) WITH PROPOFOL N/A 08/02/2021   Procedure: ESOPHAGOGASTRODUODENOSCOPY (EGD) WITH PROPOFOL;  Surgeon: Regis Bill, MD;  Location: [facility] ENDOSCOPY;  Service: Endoscopy;  Laterality: N/A;  REQUEST  AM   ESOPHAGOGASTRODUODENOSCOPY (EGD) WITH PROPOFOL N/A 10/05/2021   Procedure: ESOPHAGOGASTRODUODENOSCOPY (EGD) WITH PROPOFOL;  Surgeon: Regis Bill, MD;  Location: [facility] ENDOSCOPY;  Service: Endoscopy;  Laterality: N/A;   FRACTURE SURGERY Right 05/28/2019   distal radius fracture   HERNIA REPAIR     left inguinal hernia repair as an  infant   KYPHOPLASTY N/A 02/11/2016   Procedure: KYPHOPLASTY  L2,T9;  Surgeon: Kennedy Bucker, MD;  Location: [facility] ORS;  Service: Orthopedics;  Laterality: N/A;   KYPHOPLASTY N/A 03/15/2016   Procedure: KYPHOPLASTY L3;  Surgeon: Kennedy Bucker, MD;  Location: [facility] ORS;  Service: Orthopedics;  Laterality: N/A;   OPEN REDUCTION INTERNAL FIXATION (ORIF) DISTAL RADIAL FRACTURE Right 05/28/2019   Procedure: OPEN REDUCTION INTERNAL FIXATION (ORIF) DISTAL RADIAL FRACTURE;  Surgeon: Kennedy Bucker, MD;  Location: [facility] ORS;  Service: Orthopedics;  Laterality: Right;   skin cancer removed     TONSILLECTOMY       Subjective Assessment -      Subjective Pt reports he was able to access his [employer] soccer game with some assistance of AD and family last night and he was happy about it.    Pertinent History Charles Stanton is a 66yoM who was prescribed with Parkinson's Disease ~ 10 years.  Pt is followed by neurology, reports good reponse to medications which help control his tremors somewhat. Pt uses a SPC for AMB, also uses a walker for longer distance walking. Pt goes into community almost daily (they like to eat out) which includes lots of SPC [place] eand navigation of 4 stairs at home.    Currently in Pain? No/denies             TREATMENT DATE: 04/26/22  Physical therapy treatment session today consisted of completing assessment of goals and administration of testing as demonstrated and documented in flow sheet, treatment, and goals section of this note. Addition treatments may be found below.    [facility] PT Assessment - 04/26/22 0001       Berg Balance Test   Sit to Stand Able to stand without using hands and stabilize independently    Standing Unsupported Able to stand safely 2 minutes    Sitting with Back Unsupported but Feet Supported on Floor or Stool Able to sit safely and securely 2 minutes    Stand to Sit Sits safely with minimal use of hands    Transfers Able to transfer safely, minor use of hands     Standing Unsupported with Eyes Closed Able to stand 10 seconds safely    Standing Unsupported with Feet Together Able to place feet together independently and stand 1 minute safely    From Standing, Reach Forward with Outstretched Arm Can reach confidently >25 cm (10")    From Standing Position, Pick up Object from Floor Able to pick up shoe, needs supervision    From Standing Position, Turn to Look Behind Over each Shoulder Looks behind from both sides and weight shifts well    Turn 360 Degrees Able to turn 360 degrees safely but slowly    Standing Unsupported, Alternately Place Feet on Step/Stool Able to complete >2 steps/needs minimal assist    Standing Unsupported, One Foot in Front Able to take small step independently and hold 30 seconds    Standing on One Leg Tries to lift leg/unable to hold 3 seconds but remains standing independently    Total Score 45  Seated PWR! Up  10 , good form and min need for cues.   Note: Portions of this document were prepared using Dragon voice recognition software and although reviewed may contain unintentional dictation errors in syntax, grammar, or spelling.        PT Education -     Education Details Pt educated throughout session about proper posture and technique with exercises. Improved exercise technique, movement at target joints, use of target muscles after min to mod verbal, visual, tactile cues.     Person(s) Educated Patient    Methods Explanation;Demonstration ; handout   Comprehension Verbalized understanding;Returned demonstration;Need further instruction              PT Short Term Goals -       PT SHORT TERM GOAL #1   Title Pt to report regular performance of exercises prescribed for home and a sense of improvements in strength and balance AEB them not being as challenging anymore.    Baseline Will issue on visit 2    Time 4    Period Weeks    Status met   Target Date 03/15/22      PT SHORT TERM  GOAL #2   Title Pt to demonstrate improved power AEB improved 5xSTS hands free in <18sec.    Baseline 03/01/22: 22sec hands free, author provideing foot block bilat 03/31/22: 14.82sec 4/23:17.3 sec   Time 4    Period Weeks    Status Met    Target Date 03/29/22               PT Long Term Goals - Target goal date for all remaining long term goals is 07/19/2022        PT LONG TERM GOAL #1   Title Pt to improve score on FOTO survey to 57 to indicate reduced difficulty with basic mobility required for ADL performance.    Baseline 03/01/22:49 04/29/22:56%   Time 8    Period Weeks    Status ONGOING          PT LONG TERM GOAL #2   Title Pt to improve tolerance to overground AMB c SPC AEB performance >861ft.    Baseline 2/27: fatigued after 424ft lap performed in four minutes ten seconds. 03/31/22: 758ft with SPC no rest break 04/26/22: 794 ft    Time 8    Period Weeks    Status Progressing/ ONGOING         PT LONG TERM GOAL #3   Title Pt to demonstrate improved leg power AEB 5xSTS from chair <14sec hands free and without need for minGuard assist for safety and without need for foot block.    Baseline 03/01/22: 22sec hands free, feet blocked, minGuard assist  03/31/22: 14.82sec 4/23:17.3 sec   Time 8     Period Weeks    Status ONGOING          PT LONG TERM GOAL #4   Title Pt to demonstrate improvement in balance performance AEB >6 improvement BERG Balance for balance assessment.    Baseline 41 on 03/03/22. 3/38/24: 42 4/23: 45   Time 8    Period Weeks    Status PROGRESSING / ONGOING          5.  Pt will improve distance with LRAD to 900 feet or greater in order to indicate further progress to community ambulation distances Baseline: 794 ft with SPC  Goal status: INITIAL    Plan     Clinical Impression  Statement Patient presents to PT for recert note this date. Pt shows progress toward all of his therapeutic goals at this time. Pt shows improvement in nearly  meeting his goal indicating improved ability to ambulate closer to community Ambulator distances. Pt also shows improvement in BERG balance scale indicating reduced risk of falls as well as decreased need for AD with all ambulatory activities, although it is still recommended he use AD in various scenarios. Pt showing progress with FOTO survey indicating improved subjective level of function related to his PD diagnosis. Pt also showing progress with 5XSTS test showing improved LE power and improved ability to transfer without UE assist in addition to improved muscular power in his Les. Patient's condition has the potential to improve in response to therapy. Maximum improvement is yet to be obtained. The anticipated improvement is attainable and reasonable in a generally predictable time.  Pt will continue to benefit from skilled physical therapy intervention to address impairments, improve QOL, and attain therapy goals.        Personal Factors and Comorbidities Time since onset of injury/illness/exacerbation;Past/Current Experience    Examination-Activity Limitations Locomotion Level;Transfers;Bed Mobility;Reach Overhead;Hygiene/Grooming;Dressing;Toileting;Stairs;Bend    Stability/Clinical Decision Making Evolving/Moderate complexity    Clinical Decision Making Moderate    Rehab Potential Good    PT Frequency 2x / week    PT Duration 8 weeks    PT Treatment/Interventions ADLs/Self Care Home Management;Moist Heat;Gait training;Stair training;Functional mobility training;Therapeutic activities;Therapeutic exercise;Balance training;Neuromuscular re-education;Patient/family education    PT Next Visit Plan .  STS PWR! Up with eventual addition of PWR! Step. Work on weight shift cues with STS. Dynamic balance/gait training as indicated.    PT Home Exercise Plan 03/01/22: discussed finding and elevated sit surface for future STS exercises (something ~18-19 inches high)    Consulted and Agree with Plan of Care  Patient             Patient will benefit from skilled therapeutic intervention in order to improve the following deficits and impairments:  balance, strength, coordination, gait, transfers. Posture. ROM   Visit Diagnosis: Muscle weakness (generalized)  Unsteadiness on feet  Difficulty in walking, not elsewhere classified  Other abnormalities of gait and mobility    Problem List Patient Active Problem List   Diagnosis Date Noted   Acute respiratory failure due to COVID-19 05/28/2019   Alcohol dependence with uncomplicated withdrawal 01/23/2018   Alcohol withdrawal 01/23/2018   GIB (gastrointestinal bleeding) 12/09/2017   A-fib 12/09/2017   Esophageal obstruction 10/10/2016   Esophageal ulceration 10/10/2016   GI bleed 09/20/2016   Low vitamin B12 level 03/18/2016   S/P kyphoplasty 03/18/2016   Basal cell carcinoma 02/08/2016   Atrial fibrillation 01/27/2016   Colitis 01/27/2016   HLD (hyperlipidemia) 01/27/2016   Anxiety 01/27/2016   Hypokalemia 01/27/2016   Major depressive disorder, recurrent episode, moderate 04/14/2015   Anxiety, generalized 12/06/2013   Parkinson disease 08/07/2013   Dysphagia 05/12/2013   Esophageal mass 05/12/2013   GERD (gastroesophageal reflux disease) 05/12/2013   Vomiting 05/12/2013   Open bite of lower leg 05/10/2013   Dog bite of lower leg 05/10/2013   Abnormal finding on liver function 03/25/2013   Benign neoplasm 03/25/2013   Dizziness 03/25/2013   Type 2 diabetes mellitus 03/25/2013   BP (high blood pressure) 03/25/2013   Awareness of heartbeats 03/25/2013   Pure hypercholesterolemia 03/25/2013   Infective tonsillitis 03/25/2013   Adenomatous polyp 03/25/2013      Norman Herrlich, PT 04/26/2022, 1:05 PM  [facility] [address] [place], [place], [address] Phone: [phone]   Fax:  [phone]

## 2022-04-28 ENCOUNTER — Encounter: Payer: Self-pay | Admitting: Physical Therapy

## 2022-04-28 ENCOUNTER — Ambulatory Visit: Payer: Medicare Other | Admitting: Physical Therapy

## 2022-04-28 DIAGNOSIS — R262 Difficulty in walking, not elsewhere classified: Secondary | ICD-10-CM

## 2022-04-28 DIAGNOSIS — M6281 Muscle weakness (generalized): Secondary | ICD-10-CM | POA: Diagnosis not present

## 2022-04-28 DIAGNOSIS — R2689 Other abnormalities of gait and mobility: Secondary | ICD-10-CM

## 2022-04-28 DIAGNOSIS — R2681 Unsteadiness on feet: Secondary | ICD-10-CM

## 2022-04-28 NOTE — Therapy (Signed)
Outpatient Physical Therapy Treatment   Patient Details  Name: Charles Stanton MRN: [id number] Date of Birth: 1955-03-12 Referring Provider (PT): Dr. 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; federal employee secondary    Authorization Time Period 03/01/22-04/26/22    Progress Note Due on Visit 20    PT Start Time 1302    PT Stop Time 1342    PT Time Calculation (min) 40 min    Equipment Utilized During Treatment Gait belt    Activity Tolerance Patient tolerated treatment well;No increased pain    Behavior During Therapy [facility] for tasks assessed/performed                        Past Medical History:  Diagnosis Date   Abnormal liver function    Anemia    Anxiety    Atrial fibrillation    Cancer 2017   skin   Colonic mass    Depression    Dizziness    Dry cough    History of colon polyps    Hypercholesteremia    Hyperlipemia    Hypertension    Palpitations    Parkinson's disease    Parkinson's disease    PVD (peripheral vascular disease)    Tonsillitis 03/25/2013   Tremors of nervous system     Past Surgical History:  Procedure Laterality Date   BACK SURGERY     Kyphoplasty   March 2018   COLONOSCOPY     COLONOSCOPY WITH PROPOFOL N/A 12/12/2017   Procedure: COLONOSCOPY WITH PROPOFOL;  Surgeon: Wyline Mood, MD;  Location: [facility] ENDOSCOPY;  Service: Gastroenterology;  Laterality: N/A;   ESOPHAGOGASTRODUODENOSCOPY N/A 12/11/2017   Procedure: ESOPHAGOGASTRODUODENOSCOPY (EGD);  Surgeon: Wyline Mood, MD;  Location: [facility] ENDOSCOPY;  Service: Gastroenterology;  Laterality: N/A;   ESOPHAGOGASTRODUODENOSCOPY N/A 10/08/2021   Procedure: ESOPHAGOGASTRODUODENOSCOPY (EGD);  Surgeon: Regis Bill, MD;  Location: [facility] ENDOSCOPY;  Service: Endoscopy;  Laterality: N/A;    ESOPHAGOGASTRODUODENOSCOPY (EGD) WITH PROPOFOL N/A 09/21/2016   Procedure: ESOPHAGOGASTRODUODENOSCOPY (EGD) WITH PROPOFOL;  Surgeon: Wyline Mood, MD;  Location: [facility] ENDOSCOPY;  Service: Gastroenterology;  Laterality: N/A;   ESOPHAGOGASTRODUODENOSCOPY (EGD) WITH PROPOFOL N/A 11/07/2016   Procedure: ESOPHAGOGASTRODUODENOSCOPY (EGD) WITH PROPOFOL;  Surgeon: Wyline Mood, MD;  Location: [facility] ENDOSCOPY;  Service: Gastroenterology;  Laterality: N/A;   ESOPHAGOGASTRODUODENOSCOPY (EGD) WITH PROPOFOL N/A 10/15/2019   Procedure: ESOPHAGOGASTRODUODENOSCOPY (EGD) WITH PROPOFOL;  Surgeon: Regis Bill, MD;  Location: [facility] ENDOSCOPY;  Service: Endoscopy;  Laterality: N/A;   ESOPHAGOGASTRODUODENOSCOPY (EGD) WITH PROPOFOL N/A 10/28/2019   Procedure: ESOPHAGOGASTRODUODENOSCOPY (EGD) WITH PROPOFOL;  Surgeon: Regis Bill, MD;  Location: [facility] ENDOSCOPY;  Service: Endoscopy;  Laterality: N/A;   ESOPHAGOGASTRODUODENOSCOPY (EGD) WITH PROPOFOL N/A 12/02/2019   Procedure: ESOPHAGOGASTRODUODENOSCOPY (EGD) WITH PROPOFOL;  Surgeon: Regis Bill, MD;  Location: [facility] ENDOSCOPY;  Service: Endoscopy;  Laterality: N/A;   ESOPHAGOGASTRODUODENOSCOPY (EGD) WITH PROPOFOL N/A 02/18/2020   Procedure: ESOPHAGOGASTRODUODENOSCOPY (EGD) WITH PROPOFOL;  Surgeon: Regis Bill, MD;  Location: [facility] ENDOSCOPY;  Service: Endoscopy;  Laterality: N/A;   ESOPHAGOGASTRODUODENOSCOPY (EGD) WITH PROPOFOL N/A 08/02/2021   Procedure: ESOPHAGOGASTRODUODENOSCOPY (EGD) WITH PROPOFOL;  Surgeon: Regis Bill, MD;  Location: [facility] ENDOSCOPY;  Service: Endoscopy;  Laterality: N/A;  REQUEST AM  ESOPHAGOGASTRODUODENOSCOPY (EGD) WITH PROPOFOL N/A 10/05/2021   Procedure: ESOPHAGOGASTRODUODENOSCOPY (EGD) WITH PROPOFOL;  Surgeon: Regis Bill, MD;  Location: [facility] ENDOSCOPY;  Service: Endoscopy;  Laterality: N/A;   FRACTURE SURGERY Right 05/28/2019   distal radius fracture   HERNIA REPAIR     left inguinal hernia repair as an  infant   KYPHOPLASTY N/A 02/11/2016   Procedure: KYPHOPLASTY  L2,T9;  Surgeon: Kennedy Bucker, MD;  Location: [facility] ORS;  Service: Orthopedics;  Laterality: N/A;   KYPHOPLASTY N/A 03/15/2016   Procedure: KYPHOPLASTY L3;  Surgeon: Kennedy Bucker, MD;  Location: [facility] ORS;  Service: Orthopedics;  Laterality: N/A;   OPEN REDUCTION INTERNAL FIXATION (ORIF) DISTAL RADIAL FRACTURE Right 05/28/2019   Procedure: OPEN REDUCTION INTERNAL FIXATION (ORIF) DISTAL RADIAL FRACTURE;  Surgeon: Kennedy Bucker, MD;  Location: [facility] ORS;  Service: Orthopedics;  Laterality: Right;   skin cancer removed     TONSILLECTOMY       Subjective Assessment -      Subjective Pt reports doing well since last visit. No falls or other notable changes.    Pertinent History Charles Stanton is a 66yoM who was prescribed with Parkinson's Disease ~ 10 years.  Pt is followed by neurology, reports good reponse to medications which help control his tremors somewhat. Pt uses a SPC for AMB, also uses a walker for longer distance walking. Pt goes into community almost daily (they like to eat out) which includes lots of SPC [place] eand navigation of 4 stairs at home.    Currently in Pain? No/denies             TREATMENT DATE: 04/28/22  Exercise/Activity Sets/Reps/Time/ Resistance Assistance Charge type Comments  Octane fitness machine ( seated elyptical Level 2 x 6 min  Rest break x 2 min at minute 4 secondary to fatigue   TE For aerobic priming and instruction in future gym based exercise program  Tilt 2 height 3 with UE and LE reciprocal movement   Seated PWR! Moves  X 10 ea   NMR Cues for hand boosts   Supine PWR! Moves  2 x 10 ea   NMR Cues for proper performance to reduce compensation and to improve recruitment of target muscle groups   STS with PWR! Up modification  X 10   NMR Cues for forward weight shift simultaneously with LE muscle recruitment for STS                                             Treatment Provided this session     Pt educated throughout session about proper posture and technique with exercises. Improved exercise technique, movement at target joints, use of target muscles after min to mod verbal, visual, tactile cues. Note: Portions of this document were prepared using Dragon voice recognition software and although reviewed may contain unintentional dictation errors in syntax, grammar, or spelling.           PT Education -     Education Details Pt educated throughout session about proper posture and technique with exercises. Improved exercise technique, movement at target joints, use of target muscles after min to mod verbal, visual, tactile cues.     Person(s) Educated Patient    Methods Explanation;Demonstration ; handout   Comprehension Verbalized understanding;Returned demonstration;Need further instruction              PT Short Term Goals -  PT SHORT TERM GOAL #1   Title Pt to report regular performance of exercises prescribed for home and a sense of improvements in strength and balance AEB them not being as challenging anymore.    Baseline Will issue on visit 2    Time 4    Period Weeks    Status met   Target Date 03/15/22      PT SHORT TERM GOAL #2   Title Pt to demonstrate improved power AEB improved 5xSTS hands free in <18sec.    Baseline 03/01/22: 22sec hands free, author provideing foot block bilat 03/31/22: 14.82sec 4/23:17.3 sec   Time 4    Period Weeks    Status Met    Target Date 03/29/22               PT Long Term Goals - Target goal date for all remaining long term goals is 07/19/2022        PT LONG TERM GOAL #1   Title Pt to improve score on FOTO survey to 57 to indicate reduced difficulty with basic mobility required for ADL performance.    Baseline 03/01/22:49 04/29/22:56%   Time 8    Period Weeks    Status ONGOING          PT LONG TERM GOAL #2   Title Pt to improve tolerance to overground AMB c SPC AEB performance >859ft.     Baseline 2/27: fatigued after 448ft lap performed in four minutes ten seconds. 03/31/22: 741ft with SPC no rest break 04/26/22: 794 ft    Time 8    Period Weeks    Status Progressing/ ONGOING         PT LONG TERM GOAL #3   Title Pt to demonstrate improved leg power AEB 5xSTS from chair <14sec hands free and without need for minGuard assist for safety and without need for foot block.    Baseline 03/01/22: 22sec hands free, feet blocked, minGuard assist  03/31/22: 14.82sec 4/23:17.3 sec   Time 8     Period Weeks    Status ONGOING          PT LONG TERM GOAL #4   Title Pt to demonstrate improvement in balance performance AEB >6 improvement BERG Balance for balance assessment.    Baseline 41 on 03/03/22. 3/38/24: 42 4/23: 45   Time 8    Period Weeks    Status PROGRESSING / ONGOING          5.  Pt will improve distance with LRAD to 900 feet or greater in order to indicate further progress to community ambulation distances Baseline: 794 ft with SPC  Goal status: INITIAL    Plan     Clinical Impression Statement Pt presents with excellent motivation for completion of PT activities.  Patient introduced to team fitness trainer difficulty completing full 6-minute session on here without rest break.  Patient encouraged this to be guided to continue to practice.  Patient also instructed that well zone as part of the hospital may be of benefit of his insurance and he could look into that following discharge for continuation of his exercise.Pt will continue to benefit from skilled physical therapy intervention to address impairments, improve QOL, and attain therapy goals.        Personal Factors and Comorbidities Time since onset of injury/illness/exacerbation;Past/Current Experience    Examination-Activity Limitations Locomotion Level;Transfers;Bed Mobility;Reach Overhead;Hygiene/Grooming;Dressing;Toileting;Stairs;Bend    Stability/Clinical Decision Making Evolving/Moderate complexity     Clinical Decision Making Moderate  Rehab Potential Good    PT Frequency 2x / week    PT Duration 8 weeks    PT Treatment/Interventions ADLs/Self Care Home Management;Moist Heat;Gait training;Stair training;Functional mobility training;Therapeutic activities;Therapeutic exercise;Balance training;Neuromuscular re-education;Patient/family education    PT Next Visit Plan .  STS PWR! Up with eventual addition of PWR! Step. Work on weight shift cues with STS. Dynamic balance/gait training as indicated.    PT Home Exercise Plan 03/01/22: discussed finding and elevated sit surface for future STS exercises (something ~18-19 inches high)    Consulted and Agree with Plan of Care Patient             Patient will benefit from skilled therapeutic intervention in order to improve the following deficits and impairments:  balance, strength, coordination, gait, transfers. Posture. ROM   Visit Diagnosis: Muscle weakness (generalized)  Unsteadiness on feet  Difficulty in walking, not elsewhere classified  Other abnormalities of gait and mobility    Problem List Patient Active Problem List   Diagnosis Date Noted   Acute respiratory failure due to COVID-19 05/28/2019   Alcohol dependence with uncomplicated withdrawal 01/23/2018   Alcohol withdrawal 01/23/2018   GIB (gastrointestinal bleeding) 12/09/2017   A-fib 12/09/2017   Esophageal obstruction 10/10/2016   Esophageal ulceration 10/10/2016   GI bleed 09/20/2016   Low vitamin B12 level 03/18/2016   S/P kyphoplasty 03/18/2016   Basal cell carcinoma 02/08/2016   Atrial fibrillation 01/27/2016   Colitis 01/27/2016   HLD (hyperlipidemia) 01/27/2016   Anxiety 01/27/2016   Hypokalemia 01/27/2016   Major depressive disorder, recurrent episode, moderate 04/14/2015   Anxiety, generalized 12/06/2013   Parkinson disease 08/07/2013   Dysphagia 05/12/2013   Esophageal mass 05/12/2013   GERD (gastroesophageal reflux disease) 05/12/2013   Vomiting  05/12/2013   Open bite of lower leg 05/10/2013   Dog bite of lower leg 05/10/2013   Abnormal finding on liver function 03/25/2013   Benign neoplasm 03/25/2013   Dizziness 03/25/2013   Type 2 diabetes mellitus 03/25/2013   BP (high blood pressure) 03/25/2013   Awareness of heartbeats 03/25/2013   Pure hypercholesterolemia 03/25/2013   Infective tonsillitis 03/25/2013   Adenomatous polyp 03/25/2013      Norman Herrlich, PT 04/28/2022, 1:09 PM  [facility] [address] [place], [place], [address] Phone: [phone]   Fax:  [phone]

## 2022-05-03 ENCOUNTER — Ambulatory Visit: Payer: Medicare Other | Admitting: Physical Therapy

## 2022-05-03 DIAGNOSIS — R2689 Other abnormalities of gait and mobility: Secondary | ICD-10-CM

## 2022-05-03 DIAGNOSIS — R262 Difficulty in walking, not elsewhere classified: Secondary | ICD-10-CM

## 2022-05-03 DIAGNOSIS — M6281 Muscle weakness (generalized): Secondary | ICD-10-CM | POA: Diagnosis not present

## 2022-05-03 DIAGNOSIS — R2681 Unsteadiness on feet: Secondary | ICD-10-CM

## 2022-05-03 NOTE — Therapy (Signed)
Outpatient Physical Therapy Treatment   Patient Details  Name: Charles Stanton MRN: [id number] Date of Birth: [birth year] Referring Provider (PT): Dr. 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 for tasks assessed/performed                        Past Medical History:  Diagnosis Date   Abnormal liver function    Anemia    Anxiety    Atrial fibrillation (HCC)    Cancer (HCC) 2017   skin   Colonic mass    Depression    Dizziness    Dry cough    History of colon polyps    Hypercholesteremia    Hyperlipemia    Hypertension    Palpitations    Parkinson's disease    Parkinson's disease    PVD (peripheral vascular disease) (HCC)    Tonsillitis 03/25/2013   Tremors of nervous system     Past Surgical History:  Procedure Laterality Date   BACK SURGERY     Kyphoplasty   March 2018   COLONOSCOPY     COLONOSCOPY WITH PROPOFOL N/A 12/12/2017   Procedure: COLONOSCOPY WITH PROPOFOL;  Surgeon: Charles Mood, MD;  Location: [facility] ENDOSCOPY;  Service: Gastroenterology;  Laterality: N/A;   ESOPHAGOGASTRODUODENOSCOPY N/A 12/11/2017   Procedure: ESOPHAGOGASTRODUODENOSCOPY (EGD);  Surgeon: Charles Mood, MD;  Location: [facility] ENDOSCOPY;  Service: Gastroenterology;  Laterality: N/A;   ESOPHAGOGASTRODUODENOSCOPY N/A 10/08/2021   Procedure: ESOPHAGOGASTRODUODENOSCOPY (EGD);  Surgeon: Charles Bill, MD;  Location: [facility] ENDOSCOPY;  Service: Endoscopy;  Laterality:  N/A;   ESOPHAGOGASTRODUODENOSCOPY (EGD) WITH PROPOFOL N/A 09/21/2016   Procedure: ESOPHAGOGASTRODUODENOSCOPY (EGD) WITH PROPOFOL;  Surgeon: Charles Mood, MD;  Location: [facility] ENDOSCOPY;  Service: Gastroenterology;  Laterality: N/A;   ESOPHAGOGASTRODUODENOSCOPY (EGD) WITH PROPOFOL N/A 11/07/2016   Procedure: ESOPHAGOGASTRODUODENOSCOPY (EGD) WITH PROPOFOL;  Surgeon: Charles Mood, MD;  Location: [facility] ENDOSCOPY;  Service: Gastroenterology;  Laterality: N/A;   ESOPHAGOGASTRODUODENOSCOPY (EGD) WITH PROPOFOL N/A 10/15/2019   Procedure: ESOPHAGOGASTRODUODENOSCOPY (EGD) WITH PROPOFOL;  Surgeon: Charles Bill, MD;  Location: [facility] ENDOSCOPY;  Service: Endoscopy;  Laterality: N/A;   ESOPHAGOGASTRODUODENOSCOPY (EGD) WITH PROPOFOL N/A 10/28/2019   Procedure: ESOPHAGOGASTRODUODENOSCOPY (EGD) WITH PROPOFOL;  Surgeon: Charles Bill, MD;  Location: [facility] ENDOSCOPY;  Service: Endoscopy;  Laterality: N/A;   ESOPHAGOGASTRODUODENOSCOPY (EGD) WITH PROPOFOL N/A 12/02/2019   Procedure: ESOPHAGOGASTRODUODENOSCOPY (EGD) WITH PROPOFOL;  Surgeon: Charles Bill, MD;  Location: [facility] ENDOSCOPY;  Service: Endoscopy;  Laterality: N/A;   ESOPHAGOGASTRODUODENOSCOPY (EGD) WITH PROPOFOL N/A 02/18/2020   Procedure: ESOPHAGOGASTRODUODENOSCOPY (EGD) WITH PROPOFOL;  Surgeon: Charles Bill, MD;  Location: [facility] ENDOSCOPY;  Service: Endoscopy;  Laterality: N/A;   ESOPHAGOGASTRODUODENOSCOPY (EGD) WITH PROPOFOL N/A 08/02/2021   Procedure: ESOPHAGOGASTRODUODENOSCOPY (EGD) WITH PROPOFOL;  Surgeon: Charles Bill, MD;  Location: [facility] ENDOSCOPY;  Service: Endoscopy;  Laterality: N/A;  REQUEST AM   ESOPHAGOGASTRODUODENOSCOPY (EGD) WITH PROPOFOL N/A 10/05/2021   Procedure: ESOPHAGOGASTRODUODENOSCOPY (EGD) WITH PROPOFOL;  Surgeon: Charles Bill, MD;  Location: [facility] ENDOSCOPY;  Service: Endoscopy;  Laterality: N/A;   FRACTURE SURGERY Right 05/28/2019   distal radius fracture   HERNIA REPAIR     left inguinal hernia repair as  an infant   KYPHOPLASTY N/A 02/11/2016   Procedure: KYPHOPLASTY  L2,T9;  Surgeon: Charles Bucker, MD;  Location: [facility] ORS;  Service: Orthopedics;  Laterality: N/A;   KYPHOPLASTY N/A 03/15/2016   Procedure: KYPHOPLASTY L3;  Surgeon: Charles Bucker, MD;  Location: [facility] ORS;  Service: Orthopedics;  Laterality: N/A;   OPEN REDUCTION INTERNAL FIXATION (ORIF) DISTAL RADIAL FRACTURE Right 05/28/2019   Procedure: OPEN REDUCTION INTERNAL FIXATION (ORIF) DISTAL RADIAL FRACTURE;  Surgeon: Charles Bucker, MD;  Location: [facility] ORS;  Service: Orthopedics;  Laterality: Right;   skin cancer removed     TONSILLECTOMY       Subjective Assessment -      Subjective Pt reports doing well since last visit. No falls or other notable changes.    Pertinent History Charles Stanton is a 66yoM who was prescribed with Parkinson's Disease ~ 10 years.  Pt is followed by neurology, reports good reponse to medications which help control his tremors somewhat. Pt uses a SPC for AMB, also uses a walker for longer distance walking. Pt goes into community almost daily (they like to eat out) which includes lots of SPC [place] eand navigation of 4 stairs at home.    Currently in Pain? No/denies             TREATMENT DATE: 05/03/22  Exercise/Activity Sets/Reps/Time/ Resistance Assistance Charge type Comments  Octane fitness machine ( seated elyptical Level 2 x 6 min  Rest break x 2 min at minute 3 secondary to fatigue   TE For aerobic priming and instruction in future gym based exercise program  Tilt 2 height 3 with UE and LE reciprocal movement   Seated PWR! Moves flow  X 10 ea   NMR Decreased cues throughout   Supine PWR! Moves  2 x 10 ea   NMR Cues for proper performance to reduce compensation and to improve recruitment of target muscle groups   STS with PWR! Up modification  2 X 10   NMR Cues for forward weight shift simultaneously with LE muscle recruitment for STS   rows GTB 2 x 10   TE   LAQ X 10 4# AW   TE    Treatment Provided this  session    Pt educated throughout session about proper posture and technique with exercises. Improved exercise technique, movement at target joints, use of target muscles after min to mod verbal, visual, tactile cues. Note: Portions of this document were prepared using Dragon voice recognition software and although reviewed may contain unintentional dictation errors in syntax, grammar, or spelling.           PT Education -     Education Details Pt educated throughout session about proper posture and technique with exercises. Improved exercise technique, movement at target joints, use of target muscles after min to mod verbal, visual, tactile cues.     Person(s) Educated Patient    Methods Explanation;Demonstration ; handout   Comprehension Verbalized understanding;Returned demonstration;Need further instruction              PT Short Term Goals -       PT SHORT TERM GOAL #1   Title Pt  to report regular performance of exercises prescribed for home and a sense of improvements in strength and balance AEB them not being as challenging anymore.    Baseline Will issue on visit 2    Time 4    Period Weeks    Status met   Target Date 03/15/22      PT SHORT TERM GOAL #2   Title Pt to demonstrate improved power AEB improved 5xSTS hands free in <18sec.    Baseline 03/01/22: 22sec hands free, author provideing foot block bilat 03/31/22: 14.82sec 4/23:17.3 sec   Time 4    Period Weeks    Status Met    Target Date 03/29/22               PT Long Term Goals - Target goal date for all remaining long term goals is 07/19/2022        PT LONG TERM GOAL #1   Title Pt to improve score on FOTO survey to 57 to indicate reduced difficulty with basic mobility required for ADL performance.    Baseline 03/01/22:49 04/29/22:56%   Time 8    Period Weeks    Status ONGOING          PT LONG TERM GOAL #2   Title Pt to improve tolerance to overground AMB c SPC AEB performance >879ft.     Baseline 2/27: fatigued after 463ft lap performed in four minutes ten seconds. 03/31/22: 735ft with SPC no rest break 04/26/22: 794 ft    Time 8    Period Weeks    Status Progressing/ ONGOING         PT LONG TERM GOAL #3   Title Pt to demonstrate improved leg power AEB 5xSTS from chair <14sec hands free and without need for minGuard assist for safety and without need for foot block.    Baseline 03/01/22: 22sec hands free, feet blocked, minGuard assist  03/31/22: 14.82sec 4/23:17.3 sec   Time 8     Period Weeks    Status ONGOING          PT LONG TERM GOAL #4   Title Pt to demonstrate improvement in balance performance AEB >6 improvement BERG Balance for balance assessment.    Baseline 41 on 03/03/22. 3/38/24: 42 4/23: 45   Time 8    Period Weeks    Status PROGRESSING / ONGOING          5.  Pt will improve distance with LRAD to 900 feet or greater in order to indicate further progress to community ambulation distances Baseline: 794 ft with SPC  Goal status: INITIAL    Plan     Clinical Impression Statement Pt continues to present with excellent motivation for completion of PT activities. Pt progresses with endurance training on octane but is unable to complete without intermittent rest break. Pt continues with parkinson's specific exercises in the form of PWR! Exercises with good carryover between sessions. Pt will continue to benefit from skilled physical therapy intervention to address impairments, improve QOL, and attain therapy goals.        Personal Factors and Comorbidities Time since onset of injury/illness/exacerbation;Past/Current Experience    Examination-Activity Limitations Locomotion Level;Transfers;Bed Mobility;Reach Overhead;Hygiene/Grooming;Dressing;Toileting;Stairs;Bend    Stability/Clinical Decision Making Evolving/Moderate complexity    Clinical Decision Making Moderate    Rehab Potential Good    PT Frequency 2x / week    PT Duration 8 weeks    PT  Treatment/Interventions ADLs/Self Care Home Management;Moist Heat;Gait training;Stair training;Functional mobility  training;Therapeutic activities;Therapeutic exercise;Balance training;Neuromuscular re-education;Patient/family education    PT Next Visit Plan .  STS PWR! Up with eventual addition of PWR! Step. Work on weight shift cues with STS. Dynamic balance/gait training as indicated.    PT Home Exercise Plan 03/01/22: discussed finding and elevated sit surface for future STS exercises (something ~18-19 inches high)    Consulted and Agree with Plan of Care Patient             Patient will benefit from skilled therapeutic intervention in order to improve the following deficits and impairments:  balance, strength, coordination, gait, transfers. Posture. ROM   Visit Diagnosis: Unsteadiness on feet  Difficulty in walking, not elsewhere classified  Other abnormalities of gait and mobility    Problem List Patient Active Problem List   Diagnosis Date Noted   Acute respiratory failure due to COVID-19 (HCC) 05/28/2019   Alcohol dependence with uncomplicated withdrawal (HCC) 01/23/2018   Alcohol withdrawal (HCC) 01/23/2018   GIB (gastrointestinal bleeding) 12/09/2017   A-fib (HCC) 12/09/2017   Esophageal obstruction 10/10/2016   Esophageal ulceration 10/10/2016   GI bleed 09/20/2016   Low vitamin B12 level 03/18/2016   S/P kyphoplasty 03/18/2016   Basal cell carcinoma 02/08/2016   Atrial fibrillation (HCC) 01/27/2016   Colitis 01/27/2016   HLD (hyperlipidemia) 01/27/2016   Anxiety 01/27/2016   Hypokalemia 01/27/2016   Major depressive disorder, recurrent episode, moderate (HCC) 04/14/2015   Anxiety, generalized 12/06/2013   Parkinson disease 08/07/2013   Dysphagia 05/12/2013   Esophageal mass 05/12/2013   GERD (gastroesophageal reflux disease) 05/12/2013   Vomiting 05/12/2013   Open bite of lower leg 05/10/2013   Dog bite of lower leg 05/10/2013   Abnormal finding on liver  function 03/25/2013   Benign neoplasm 03/25/2013   Dizziness 03/25/2013   Type 2 diabetes mellitus (HCC) 03/25/2013   BP (high blood pressure) 03/25/2013   Awareness of heartbeats 03/25/2013   Pure hypercholesterolemia 03/25/2013   Infective tonsillitis 03/25/2013   Adenomatous polyp 03/25/2013      Norman Herrlich, PT 05/03/2022, 1:07 PM  [facility] at [facility] [address] [place], [place], [address] Phone: [phone]   Fax:  [phone]

## 2022-05-05 ENCOUNTER — Ambulatory Visit: Payer: Medicare Other | Attending: Neurology

## 2022-05-05 DIAGNOSIS — R278 Other lack of coordination: Secondary | ICD-10-CM | POA: Insufficient documentation

## 2022-05-05 DIAGNOSIS — R2681 Unsteadiness on feet: Secondary | ICD-10-CM | POA: Insufficient documentation

## 2022-05-05 DIAGNOSIS — R262 Difficulty in walking, not elsewhere classified: Secondary | ICD-10-CM | POA: Diagnosis present

## 2022-05-05 DIAGNOSIS — R2689 Other abnormalities of gait and mobility: Secondary | ICD-10-CM | POA: Insufficient documentation

## 2022-05-05 DIAGNOSIS — M6281 Muscle weakness (generalized): Secondary | ICD-10-CM | POA: Diagnosis present

## 2022-05-05 NOTE — Therapy (Signed)
Outpatient Physical Therapy Treatment/Physical Therapy Progress Note   Dates of reporting period  03/31/2022   to   05/05/2022    Patient Details  Name: Charles Stanton MRN: [id number] Date of Birth: 12/20/1955 Referring Provider (PT): Dr. Cristopher Peru   Encounter Date: 05/05/2022  END OF SESSION:    PT End of Session - 05/05/22 1301     Visit Number 20    Number of Visits 40    Date for PT Re-Evaluation 07/19/22    Authorization Type Medicare A & B primary; federal employee secondary    Authorization Time Period 03/01/22-04/26/22    Progress Note Due on Visit 20    PT Start Time 1302    PT Stop Time 1344    PT Time Calculation (min) 42 min    Equipment Utilized During Treatment Gait belt    Activity Tolerance Patient tolerated treatment well;No increased pain    Behavior During Therapy [facility] for tasks assessed/performed                        Past Medical History:  Diagnosis Date   Abnormal liver function    Anemia    Anxiety    Atrial fibrillation (HCC)    Cancer (HCC) 2017   skin   Colonic mass    Depression    Dizziness    Dry cough    History of colon polyps    Hypercholesteremia    Hyperlipemia    Hypertension    Palpitations    Parkinson's disease    Parkinson's disease    PVD (peripheral vascular disease) (HCC)    Tonsillitis 03/25/2013   Tremors of nervous system     Past Surgical History:  Procedure Laterality Date   BACK SURGERY     Kyphoplasty   March 2018   COLONOSCOPY     COLONOSCOPY WITH PROPOFOL N/A 12/12/2017   Procedure: COLONOSCOPY WITH PROPOFOL;  Surgeon: Wyline Mood, MD;  Location: [facility] ENDOSCOPY;  Service: Gastroenterology;  Laterality: N/A;   ESOPHAGOGASTRODUODENOSCOPY N/A 12/11/2017   Procedure: ESOPHAGOGASTRODUODENOSCOPY (EGD);  Surgeon: Wyline Mood, MD;  Location: [facility] ENDOSCOPY;  Service: Gastroenterology;  Laterality: N/A;   ESOPHAGOGASTRODUODENOSCOPY N/A 10/08/2021   Procedure: ESOPHAGOGASTRODUODENOSCOPY (EGD);   Surgeon: Regis Bill, MD;  Location: [facility] ENDOSCOPY;  Service: Endoscopy;  Laterality: N/A;   ESOPHAGOGASTRODUODENOSCOPY (EGD) WITH PROPOFOL N/A 09/21/2016   Procedure: ESOPHAGOGASTRODUODENOSCOPY (EGD) WITH PROPOFOL;  Surgeon: Wyline Mood, MD;  Location: [facility] ENDOSCOPY;  Service: Gastroenterology;  Laterality: N/A;   ESOPHAGOGASTRODUODENOSCOPY (EGD) WITH PROPOFOL N/A 11/07/2016   Procedure: ESOPHAGOGASTRODUODENOSCOPY (EGD) WITH PROPOFOL;  Surgeon: Wyline Mood, MD;  Location: [facility] ENDOSCOPY;  Service: Gastroenterology;  Laterality: N/A;   ESOPHAGOGASTRODUODENOSCOPY (EGD) WITH PROPOFOL N/A 10/15/2019   Procedure: ESOPHAGOGASTRODUODENOSCOPY (EGD) WITH PROPOFOL;  Surgeon: Regis Bill, MD;  Location: [facility] ENDOSCOPY;  Service: Endoscopy;  Laterality: N/A;   ESOPHAGOGASTRODUODENOSCOPY (EGD) WITH PROPOFOL N/A 10/28/2019   Procedure: ESOPHAGOGASTRODUODENOSCOPY (EGD) WITH PROPOFOL;  Surgeon: Regis Bill, MD;  Location: [facility] ENDOSCOPY;  Service: Endoscopy;  Laterality: N/A;   ESOPHAGOGASTRODUODENOSCOPY (EGD) WITH PROPOFOL N/A 12/02/2019   Procedure: ESOPHAGOGASTRODUODENOSCOPY (EGD) WITH PROPOFOL;  Surgeon: Regis Bill, MD;  Location: [facility] ENDOSCOPY;  Service: Endoscopy;  Laterality: N/A;   ESOPHAGOGASTRODUODENOSCOPY (EGD) WITH PROPOFOL N/A 02/18/2020   Procedure: ESOPHAGOGASTRODUODENOSCOPY (EGD) WITH PROPOFOL;  Surgeon: Regis Bill, MD;  Location: [facility] ENDOSCOPY;  Service: Endoscopy;  Laterality: N/A;   ESOPHAGOGASTRODUODENOSCOPY (EGD) WITH PROPOFOL N/A 08/02/2021   Procedure: ESOPHAGOGASTRODUODENOSCOPY (EGD) WITH  PROPOFOL;  Surgeon: Regis Bill, MD;  Location: [facility] ENDOSCOPY;  Service: Endoscopy;  Laterality: N/A;  REQUEST AM   ESOPHAGOGASTRODUODENOSCOPY (EGD) WITH PROPOFOL N/A 10/05/2021   Procedure: ESOPHAGOGASTRODUODENOSCOPY (EGD) WITH PROPOFOL;  Surgeon: Regis Bill, MD;  Location: [facility] ENDOSCOPY;  Service: Endoscopy;  Laterality: N/A;   FRACTURE SURGERY  Right 05/28/2019   distal radius fracture   HERNIA REPAIR     left inguinal hernia repair as an infant   KYPHOPLASTY N/A 02/11/2016   Procedure: KYPHOPLASTY  L2,T9;  Surgeon: Kennedy Bucker, MD;  Location: [facility] ORS;  Service: Orthopedics;  Laterality: N/A;   KYPHOPLASTY N/A 03/15/2016   Procedure: KYPHOPLASTY L3;  Surgeon: Kennedy Bucker, MD;  Location: [facility] ORS;  Service: Orthopedics;  Laterality: N/A;   OPEN REDUCTION INTERNAL FIXATION (ORIF) DISTAL RADIAL FRACTURE Right 05/28/2019   Procedure: OPEN REDUCTION INTERNAL FIXATION (ORIF) DISTAL RADIAL FRACTURE;  Surgeon: Kennedy Bucker, MD;  Location: [facility] ORS;  Service: Orthopedics;  Laterality: Right;   skin cancer removed     TONSILLECTOMY       Subjective Assessment -      Subjective Pt reports he went to a dance recital with over 400 people present and states feeling overwhelmed with risk of falling. He reports he was able to negotiate through crowd using his cane but states "I was scared."   Pertinent History Charles Stanton is a 66yoM who was prescribed with Parkinson's Disease ~ 10 years.  Pt is followed by neurology, reports good reponse to medications which help control his tremors somewhat. Pt uses a SPC for AMB, also uses a walker for longer distance walking. Pt goes into community almost daily (they like to eat out) which includes lots of SPC [place] eand navigation of 4 stairs at home.    Currently in Pain? No/denies             TREATMENT DATE: 05/05/22  Exercise/Activity Sets/Reps/Time/ Resistance Assistance Charge type Comments  Manual stretch with lower trunk rotation   TE Increased tightness initially- did loosen up with increased reps  Gait in clinic with SPC 180 CGA TE Short but reciprocal steps with occasional VC to increase support cane  Supine open book trunk rotation  2 x 10 ea way  NMR Cues for proper performance to reduce compensation and to improve recruitment of target muscle groups   Sidelye open book trunk rotation X 10  each  NMR   STS with arms across chest 2 X 10   NMR Cues for forward weight shift simultaneously with LE muscle recruitment for STS   Scap rows GTB 2 x 10   TE  VC for technique  Step and reach forward/overhead X 10 each LE  TE    Supine- active  Lower trunk rotation  X 20 reps each side  TE     Supine - Alt hip march (feet 6-10 in off mat)   X 20 reps Each LE  TE   Treatment Provided this session    Pt educated throughout session about proper posture and technique with exercises. Improved exercise technique, movement at target joints, use of target muscles after min to mod verbal, visual, tactile cues.           PT Education -     Education Details Pt educated throughout session about proper posture and technique with exercises. Improved exercise technique, movement at target joints, use of target muscles after min to mod verbal, visual, tactile cues.     Person(s) Educated Patient  Methods Explanation;Demonstration ; handout   Comprehension Verbalized understanding;Returned demonstration;Need further instruction              PT Short Term Goals -       PT SHORT TERM GOAL #1   Title Pt to report regular performance of exercises prescribed for home and a sense of improvements in strength and balance AEB them not being as challenging anymore.    Baseline Will issue on visit 2    Time 4    Period Weeks    Status met   Target Date 03/15/22      PT SHORT TERM GOAL #2   Title Pt to demonstrate improved power AEB improved 5xSTS hands free in <18sec.    Baseline 03/01/22: 22sec hands free, author provideing foot block bilat 03/31/22: 14.82sec 4/23:17.3 sec   Time 4    Period Weeks    Status Met    Target Date 03/29/22               PT Long Term Goals - Target goal date for all remaining long term goals is 07/19/2022        PT LONG TERM GOAL #1   Title Pt to improve score on FOTO survey to 57 to indicate reduced difficulty with basic mobility required for  ADL performance.    Baseline 03/01/22:49 04/29/22:56%   Time 8    Period Weeks    Status ONGOING          PT LONG TERM GOAL #2   Title Pt to improve tolerance to overground AMB c SPC AEB performance >864ft.    Baseline 2/27: fatigued after 426ft lap performed in four minutes ten seconds. 03/31/22: 737ft with SPC no rest break 04/26/22: 794 ft    Time 8    Period Weeks    Status Progressing/ ONGOING         PT LONG TERM GOAL #3   Title Pt to demonstrate improved leg power AEB 5xSTS from chair <14sec hands free and without need for minGuard assist for safety and without need for foot block.    Baseline 03/01/22: 22sec hands free, feet blocked, minGuard assist  03/31/22: 14.82sec 4/23:17.3 sec   Time 8     Period Weeks    Status ONGOING          PT LONG TERM GOAL #4   Title Pt to demonstrate improvement in balance performance AEB >6 improvement BERG Balance for balance assessment.    Baseline 41 on 03/03/22. 3/38/24: 42 4/23: 45   Time 8    Period Weeks    Status PROGRESSING / ONGOING          5.  Pt will improve distance with LRAD to 900 feet or greater in order to indicate further progress to community ambulation distances Baseline: 794 ft with SPC  Goal status: INITIAL    Plan     Clinical Impression Statement Patient presents with good motivation. Today was progress visit note #20 but patient was just re-certified last week so did not retest goals today. Patient presented with increased tightness so spent some time reviewing stretching that could be performed at home. He was able to improve some flexibility with effort. He Patient's condition has the potential to improve in response to therapy. Maximum improvement is yet to be obtained. The anticipated improvement is attainable and reasonable in a generally predictable time.   Pt will continue to benefit from skilled physical therapy intervention to address  impairments, improve QOL, and attain therapy goals.         Personal Factors and Comorbidities Time since onset of injury/illness/exacerbation;Past/Current Experience    Examination-Activity Limitations Locomotion Level;Transfers;Bed Mobility;Reach Overhead;Hygiene/Grooming;Dressing;Toileting;Stairs;Bend    Stability/Clinical Decision Making Evolving/Moderate complexity    Clinical Decision Making Moderate    Rehab Potential Good    PT Frequency 2x / week    PT Duration 8 weeks    PT Treatment/Interventions ADLs/Self Care Home Management;Moist Heat;Gait training;Stair training;Functional mobility training;Therapeutic activities;Therapeutic exercise;Balance training;Neuromuscular re-education;Patient/family education    PT Next Visit Plan .  STS PWR! Up with eventual addition of PWR! Step. Work on weight shift cues with STS. Dynamic balance/gait training as indicated.    PT Home Exercise Plan 03/01/22: discussed finding and elevated sit surface for future STS exercises (something ~18-19 inches high)    Consulted and Agree with Plan of Care Patient             Patient will benefit from skilled therapeutic intervention in order to improve the following deficits and impairments:  balance, strength, coordination, gait, transfers. Posture. ROM   Visit Diagnosis: Unsteadiness on feet  Difficulty in walking, not elsewhere classified  Other abnormalities of gait and mobility  Muscle weakness (generalized)    Problem List Patient Active Problem List   Diagnosis Date Noted   Acute respiratory failure due to COVID-19 (HCC) 05/28/2019   Alcohol dependence with uncomplicated withdrawal (HCC) 01/23/2018   Alcohol withdrawal (HCC) 01/23/2018   GIB (gastrointestinal bleeding) 12/09/2017   A-fib (HCC) 12/09/2017   Esophageal obstruction 10/10/2016   Esophageal ulceration 10/10/2016   GI bleed 09/20/2016   Low vitamin B12 level 03/18/2016   S/P kyphoplasty 03/18/2016   Basal cell carcinoma 02/08/2016   Atrial fibrillation (HCC) 01/27/2016   Colitis  01/27/2016   HLD (hyperlipidemia) 01/27/2016   Anxiety 01/27/2016   Hypokalemia 01/27/2016   Major depressive disorder, recurrent episode, moderate (HCC) 04/14/2015   Anxiety, generalized 12/06/2013   Parkinson disease 08/07/2013   Dysphagia 05/12/2013   Esophageal mass 05/12/2013   GERD (gastroesophageal reflux disease) 05/12/2013   Vomiting 05/12/2013   Open bite of lower leg 05/10/2013   Dog bite of lower leg 05/10/2013   Abnormal finding on liver function 03/25/2013   Benign neoplasm 03/25/2013   Dizziness 03/25/2013   Type 2 diabetes mellitus (HCC) 03/25/2013   BP (high blood pressure) 03/25/2013   Awareness of heartbeats 03/25/2013   Pure hypercholesterolemia 03/25/2013   Infective tonsillitis 03/25/2013   Adenomatous polyp 03/25/2013      Lenda Kelp, PT 05/05/2022, 4:43 PM  [facility] [address] [place], [place], [address] Phone: [phone]   Fax:  [phone]

## 2022-05-10 ENCOUNTER — Ambulatory Visit: Payer: Medicare Other | Admitting: Physical Therapy

## 2022-05-11 ENCOUNTER — Ambulatory Visit: Payer: Medicare Other

## 2022-05-11 DIAGNOSIS — R2689 Other abnormalities of gait and mobility: Secondary | ICD-10-CM

## 2022-05-11 DIAGNOSIS — R2681 Unsteadiness on feet: Secondary | ICD-10-CM

## 2022-05-11 DIAGNOSIS — R262 Difficulty in walking, not elsewhere classified: Secondary | ICD-10-CM

## 2022-05-11 DIAGNOSIS — M6281 Muscle weakness (generalized): Secondary | ICD-10-CM

## 2022-05-11 NOTE — Therapy (Signed)
Outpatient Physical Therapy Treatment    Patient Details  Name: Charles Stanton MRN: [id number] Date of Birth: 02-Dec-1955 Referring Provider (PT): Dr. 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; federal employee secondary    Authorization Time Period 03/01/22-04/26/22    Progress Note Due on Visit 30    PT Start Time 1350    PT Stop Time 1429    PT Time Calculation (min) 39 min    Equipment Utilized During Treatment Gait belt    Activity Tolerance Patient tolerated treatment well;No increased pain    Behavior During Therapy [facility] for tasks assessed/performed                        Past Medical History:  Diagnosis Date   Abnormal liver function    Anemia    Anxiety    Atrial fibrillation (HCC)    Cancer (HCC) 2017   skin   Colonic mass    Depression    Dizziness    Dry cough    History of colon polyps    Hypercholesteremia    Hyperlipemia    Hypertension    Palpitations    Parkinson's disease    Parkinson's disease    PVD (peripheral vascular disease) (HCC)    Tonsillitis 03/25/2013   Tremors of nervous system     Past Surgical History:  Procedure Laterality Date   BACK SURGERY     Kyphoplasty   March 2018   COLONOSCOPY     COLONOSCOPY WITH PROPOFOL N/A 12/12/2017   Procedure: COLONOSCOPY WITH PROPOFOL;  Surgeon: Wyline Mood, MD;  Location: [facility] ENDOSCOPY;  Service: Gastroenterology;  Laterality: N/A;   ESOPHAGOGASTRODUODENOSCOPY N/A 12/11/2017   Procedure: ESOPHAGOGASTRODUODENOSCOPY (EGD);  Surgeon: Wyline Mood, MD;  Location: [facility] ENDOSCOPY;  Service: Gastroenterology;  Laterality: N/A;   ESOPHAGOGASTRODUODENOSCOPY N/A 10/08/2021   Procedure: ESOPHAGOGASTRODUODENOSCOPY (EGD);  Surgeon: Regis Bill, MD;  Location: [facility] ENDOSCOPY;  Service: Endoscopy;  Laterality:  N/A;   ESOPHAGOGASTRODUODENOSCOPY (EGD) WITH PROPOFOL N/A 09/21/2016   Procedure: ESOPHAGOGASTRODUODENOSCOPY (EGD) WITH PROPOFOL;  Surgeon: Wyline Mood, MD;  Location: [facility] ENDOSCOPY;  Service: Gastroenterology;  Laterality: N/A;   ESOPHAGOGASTRODUODENOSCOPY (EGD) WITH PROPOFOL N/A 11/07/2016   Procedure: ESOPHAGOGASTRODUODENOSCOPY (EGD) WITH PROPOFOL;  Surgeon: Wyline Mood, MD;  Location: [facility] ENDOSCOPY;  Service: Gastroenterology;  Laterality: N/A;   ESOPHAGOGASTRODUODENOSCOPY (EGD) WITH PROPOFOL N/A 10/15/2019   Procedure: ESOPHAGOGASTRODUODENOSCOPY (EGD) WITH PROPOFOL;  Surgeon: Regis Bill, MD;  Location: [facility] ENDOSCOPY;  Service: Endoscopy;  Laterality: N/A;   ESOPHAGOGASTRODUODENOSCOPY (EGD) WITH PROPOFOL N/A 10/28/2019   Procedure: ESOPHAGOGASTRODUODENOSCOPY (EGD) WITH PROPOFOL;  Surgeon: Regis Bill, MD;  Location: [facility] ENDOSCOPY;  Service: Endoscopy;  Laterality: N/A;   ESOPHAGOGASTRODUODENOSCOPY (EGD) WITH PROPOFOL N/A 12/02/2019   Procedure: ESOPHAGOGASTRODUODENOSCOPY (EGD) WITH PROPOFOL;  Surgeon: Regis Bill, MD;  Location: [facility] ENDOSCOPY;  Service: Endoscopy;  Laterality: N/A;   ESOPHAGOGASTRODUODENOSCOPY (EGD) WITH PROPOFOL N/A 02/18/2020   Procedure: ESOPHAGOGASTRODUODENOSCOPY (EGD) WITH PROPOFOL;  Surgeon: Regis Bill, MD;  Location: [facility] ENDOSCOPY;  Service: Endoscopy;  Laterality: N/A;   ESOPHAGOGASTRODUODENOSCOPY (EGD) WITH PROPOFOL N/A 08/02/2021   Procedure: ESOPHAGOGASTRODUODENOSCOPY (EGD) WITH PROPOFOL;  Surgeon: Regis Bill, MD;  Location: [facility] ENDOSCOPY;  Service: Endoscopy;  Laterality: N/A;  REQUEST AM   ESOPHAGOGASTRODUODENOSCOPY (EGD) WITH PROPOFOL N/A 10/05/2021   Procedure: ESOPHAGOGASTRODUODENOSCOPY (EGD) WITH PROPOFOL;  Surgeon: Regis Bill, MD;  Location: [facility] ENDOSCOPY;  Service: Endoscopy;  Laterality: N/A;   FRACTURE SURGERY Right 05/28/2019   distal radius fracture   HERNIA REPAIR     left inguinal hernia repair as  an infant   KYPHOPLASTY N/A 02/11/2016   Procedure: KYPHOPLASTY  L2,T9;  Surgeon: Kennedy Bucker, MD;  Location: [facility] ORS;  Service: Orthopedics;  Laterality: N/A;   KYPHOPLASTY N/A 03/15/2016   Procedure: KYPHOPLASTY L3;  Surgeon: Kennedy Bucker, MD;  Location: [facility] ORS;  Service: Orthopedics;  Laterality: N/A;   OPEN REDUCTION INTERNAL FIXATION (ORIF) DISTAL RADIAL FRACTURE Right 05/28/2019   Procedure: OPEN REDUCTION INTERNAL FIXATION (ORIF) DISTAL RADIAL FRACTURE;  Surgeon: Kennedy Bucker, MD;  Location: [facility] ORS;  Service: Orthopedics;  Laterality: Right;   skin cancer removed     TONSILLECTOMY       Subjective Assessment -      Subjective Patient reports doing well overall and heading to beach with family tomorrow.    Pertinent History Charles Stanton is a 66yoM who was prescribed with Parkinson's Disease ~ 10 years.  Pt is followed by neurology, reports good reponse to medications which help control his tremors somewhat. Pt uses a SPC for AMB, also uses a walker for longer distance walking. Pt goes into community almost daily (they like to eat out) which includes lots of SPC [place] eand navigation of 4 stairs at home.    Currently in Pain? No/denies             TREATMENT DATE: 05/11/2022     Exercise/Activity Sets/Reps/Time/ Resistance Assistance Charge type Comments  Gait in clinic with SPC 320 feet  (Timed 2nd lap = 160 feet in 1 min 22 sec) CGA TE Short but reciprocal steps with occasional VC to increase support cane  STS with arms across chest 2 X 10  VC and visual demo TE Cues for forward weight shift simultaneously with LE muscle recruitment for STS   Scap rows GTB 2 x 10  VC and visual demo TE  VC for technique  Horizontal Shoulder Abduction 2 x 10 reps  TE   Step and reach forward/overhead X 10 each LE  TE    Reclined at edge of mat with LE hanging off mat - Alt hip march    X 20 reps Each LE  TE   Reclined at edge of mat with LE laying off side for B Hip flexor stretch-  3 min SBA  TE    Reclined at edge of mat with LE laying off side- LAQ 10 reps with 10 sec SBA TE   Treatment Provided this session    Pt educated throughout session about proper posture and technique with exercises. Improved exercise technique, movement at target joints, use of target muscles after min to mod verbal, visual, tactile cues.           PT Education -     Education Details Pt educated throughout session about proper posture and technique with exercises. Improved exercise technique, movement at target joints, use of target muscles after min to mod verbal, visual, tactile cues.     Person(s) Educated Patient    Methods Explanation;Demonstration ; handout   Comprehension Verbalized understanding;Returned demonstration;Need further instruction              PT Short Term Goals -       PT SHORT  TERM GOAL #1   Title Pt to report regular performance of exercises prescribed for home and a sense of improvements in strength and balance AEB them not being as challenging anymore.    Baseline Will issue on visit 2    Time 4    Period Weeks    Status met   Target Date 03/15/22      PT SHORT TERM GOAL #2   Title Pt to demonstrate improved power AEB improved 5xSTS hands free in <18sec.    Baseline 03/01/22: 22sec hands free, author provideing foot block bilat 03/31/22: 14.82sec 4/23:17.3 sec   Time 4    Period Weeks    Status Met    Target Date 03/29/22               PT Long Term Goals - Target goal date for all remaining long term goals is 07/19/2022        PT LONG TERM GOAL #1   Title Pt to improve score on FOTO survey to 57 to indicate reduced difficulty with basic mobility required for ADL performance.    Baseline 03/01/22:49 04/29/22:56%   Time 8    Period Weeks    Status ONGOING          PT LONG TERM GOAL #2   Title Pt to improve tolerance to overground AMB c SPC AEB performance >864ft.    Baseline 2/27: fatigued after 437ft lap performed in four minutes  ten seconds. 03/31/22: 713ft with SPC no rest break 04/26/22: 794 ft    Time 8    Period Weeks    Status Progressing/ ONGOING         PT LONG TERM GOAL #3   Title Pt to demonstrate improved leg power AEB 5xSTS from chair <14sec hands free and without need for minGuard assist for safety and without need for foot block.    Baseline 03/01/22: 22sec hands free, feet blocked, minGuard assist  03/31/22: 14.82sec 4/23:17.3 sec   Time 8     Period Weeks    Status ONGOING          PT LONG TERM GOAL #4   Title Pt to demonstrate improvement in balance performance AEB >6 improvement BERG Balance for balance assessment.    Baseline 41 on 03/03/22. 3/38/24: 42 4/23: 45   Time 8    Period Weeks    Status PROGRESSING / ONGOING          5.  Pt will improve distance with LRAD to 900 feet or greater in order to indicate further progress to community ambulation distances Baseline: 794 ft with SPC  Goal status: INITIAL    Plan     Clinical Impression Statement Patient performed well overall today- tight bilateral hip flexors - able to stretch them out in reclined position today. He responded better- good step length and symmetry today. Fatigued with sit to stand yet able to complete with increased time.  Pt will continue to benefit from skilled physical therapy intervention to address impairments, improve QOL, and attain therapy goals.        Personal Factors and Comorbidities Time since onset of injury/illness/exacerbation;Past/Current Experience    Examination-Activity Limitations Locomotion Level;Transfers;Bed Mobility;Reach Overhead;Hygiene/Grooming;Dressing;Toileting;Stairs;Bend    Stability/Clinical Decision Making Evolving/Moderate complexity    Clinical Decision Making Moderate    Rehab Potential Good    PT Frequency 2x / week    PT Duration 8 weeks    PT Treatment/Interventions ADLs/Self Care Home Management;Moist Heat;Gait training;Stair  training;Functional mobility  training;Therapeutic activities;Therapeutic exercise;Balance training;Neuromuscular re-education;Patient/family education    PT Next Visit Plan .  STS PWR! Up with eventual addition of PWR! Step. Work on weight shift cues with STS. Dynamic balance/gait training as indicated.    PT Home Exercise Plan 03/01/22: discussed finding and elevated sit surface for future STS exercises (something ~18-19 inches high)    Consulted and Agree with Plan of Care Patient             Patient will benefit from skilled therapeutic intervention in order to improve the following deficits and impairments:  balance, strength, coordination, gait, transfers. Posture. ROM   Visit Diagnosis: Unsteadiness on feet  Difficulty in walking, not elsewhere classified  Other abnormalities of gait and mobility  Muscle weakness (generalized)    Problem List Patient Active Problem List   Diagnosis Date Noted   Acute respiratory failure due to COVID-19 (HCC) 05/28/2019   Alcohol dependence with uncomplicated withdrawal (HCC) 01/23/2018   Alcohol withdrawal (HCC) 01/23/2018   GIB (gastrointestinal bleeding) 12/09/2017   A-fib (HCC) 12/09/2017   Esophageal obstruction 10/10/2016   Esophageal ulceration 10/10/2016   GI bleed 09/20/2016   Low vitamin B12 level 03/18/2016   S/P kyphoplasty 03/18/2016   Basal cell carcinoma 02/08/2016   Atrial fibrillation (HCC) 01/27/2016   Colitis 01/27/2016   HLD (hyperlipidemia) 01/27/2016   Anxiety 01/27/2016   Hypokalemia 01/27/2016   Major depressive disorder, recurrent episode, moderate (HCC) 04/14/2015   Anxiety, generalized 12/06/2013   Parkinson disease 08/07/2013   Dysphagia 05/12/2013   Esophageal mass 05/12/2013   GERD (gastroesophageal reflux disease) 05/12/2013   Vomiting 05/12/2013   Open bite of lower leg 05/10/2013   Dog bite of lower leg 05/10/2013   Abnormal finding on liver function 03/25/2013   Benign neoplasm 03/25/2013   Dizziness 03/25/2013   Type  2 diabetes mellitus (HCC) 03/25/2013   BP (high blood pressure) 03/25/2013   Awareness of heartbeats 03/25/2013   Pure hypercholesterolemia 03/25/2013   Infective tonsillitis 03/25/2013   Adenomatous polyp 03/25/2013      Lenda Kelp, PT 05/12/2022, 9:16 AM  [place] [facility] [address] [place], [place], [address] Phone: [phone]   Fax:  [phone]

## 2022-05-12 ENCOUNTER — Ambulatory Visit: Payer: Medicare Other | Admitting: Physical Therapy

## 2022-05-17 ENCOUNTER — Ambulatory Visit: Payer: Medicare Other | Admitting: Physical Therapy

## 2022-05-17 ENCOUNTER — Encounter: Payer: Self-pay | Admitting: Physical Therapy

## 2022-05-17 DIAGNOSIS — M6281 Muscle weakness (generalized): Secondary | ICD-10-CM

## 2022-05-17 DIAGNOSIS — R2681 Unsteadiness on feet: Secondary | ICD-10-CM | POA: Diagnosis not present

## 2022-05-17 DIAGNOSIS — R2689 Other abnormalities of gait and mobility: Secondary | ICD-10-CM

## 2022-05-17 DIAGNOSIS — R262 Difficulty in walking, not elsewhere classified: Secondary | ICD-10-CM

## 2022-05-17 NOTE — Therapy (Signed)
Outpatient Physical Therapy Treatment    Patient Details  Name: Charles Stanton MRN: [id number] Date of Birth: 05/09/1955 Referring Provider (PT): Dr. 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; federal employee secondary    Authorization Time Period -07/19/22    Progress Note Due on Visit 30    PT Start Time 1345    PT Stop Time 1429    PT Time Calculation (min) 44 min    Equipment Utilized During Treatment Gait belt    Activity Tolerance Patient tolerated treatment well;No increased pain    Behavior During Therapy [facility] for tasks assessed/performed                         Past Medical History:  Diagnosis Date   Abnormal liver function    Anemia    Anxiety    Atrial fibrillation (HCC)    Cancer (HCC) 2017   skin   Colonic mass    Depression    Dizziness    Dry cough    History of colon polyps    Hypercholesteremia    Hyperlipemia    Hypertension    Palpitations    Parkinson's disease    Parkinson's disease    PVD (peripheral vascular disease) (HCC)    Tonsillitis 03/25/2013   Tremors of nervous system     Past Surgical History:  Procedure Laterality Date   BACK SURGERY     Kyphoplasty   March 2018   COLONOSCOPY     COLONOSCOPY WITH PROPOFOL N/A 12/12/2017   Procedure: COLONOSCOPY WITH PROPOFOL;  Surgeon: Wyline Mood, MD;  Location: [facility] ENDOSCOPY;  Service: Gastroenterology;  Laterality: N/A;   ESOPHAGOGASTRODUODENOSCOPY N/A 12/11/2017   Procedure: ESOPHAGOGASTRODUODENOSCOPY (EGD);  Surgeon: Wyline Mood, MD;  Location: [facility] ENDOSCOPY;  Service: Gastroenterology;  Laterality: N/A;   ESOPHAGOGASTRODUODENOSCOPY N/A 10/08/2021   Procedure: ESOPHAGOGASTRODUODENOSCOPY (EGD);  Surgeon: Regis Bill, MD;  Location: [facility] ENDOSCOPY;  Service: Endoscopy;  Laterality:  N/A;   ESOPHAGOGASTRODUODENOSCOPY (EGD) WITH PROPOFOL N/A 09/21/2016   Procedure: ESOPHAGOGASTRODUODENOSCOPY (EGD) WITH PROPOFOL;  Surgeon: Wyline Mood, MD;  Location: [facility] ENDOSCOPY;  Service: Gastroenterology;  Laterality: N/A;   ESOPHAGOGASTRODUODENOSCOPY (EGD) WITH PROPOFOL N/A 11/07/2016   Procedure: ESOPHAGOGASTRODUODENOSCOPY (EGD) WITH PROPOFOL;  Surgeon: Wyline Mood, MD;  Location: [facility] ENDOSCOPY;  Service: Gastroenterology;  Laterality: N/A;   ESOPHAGOGASTRODUODENOSCOPY (EGD) WITH PROPOFOL N/A 10/15/2019   Procedure: ESOPHAGOGASTRODUODENOSCOPY (EGD) WITH PROPOFOL;  Surgeon: Regis Bill, MD;  Location: [facility] ENDOSCOPY;  Service: Endoscopy;  Laterality: N/A;   ESOPHAGOGASTRODUODENOSCOPY (EGD) WITH PROPOFOL N/A 10/28/2019   Procedure: ESOPHAGOGASTRODUODENOSCOPY (EGD) WITH PROPOFOL;  Surgeon: Regis Bill, MD;  Location: [facility] ENDOSCOPY;  Service: Endoscopy;  Laterality: N/A;   ESOPHAGOGASTRODUODENOSCOPY (EGD) WITH PROPOFOL N/A 12/02/2019   Procedure: ESOPHAGOGASTRODUODENOSCOPY (EGD) WITH PROPOFOL;  Surgeon: Regis Bill, MD;  Location: [facility] ENDOSCOPY;  Service: Endoscopy;  Laterality: N/A;   ESOPHAGOGASTRODUODENOSCOPY (EGD) WITH PROPOFOL N/A 02/18/2020   Procedure: ESOPHAGOGASTRODUODENOSCOPY (EGD) WITH PROPOFOL;  Surgeon: Regis Bill, MD;  Location: [facility] ENDOSCOPY;  Service: Endoscopy;  Laterality: N/A;   ESOPHAGOGASTRODUODENOSCOPY (EGD) WITH PROPOFOL N/A 08/02/2021   Procedure: ESOPHAGOGASTRODUODENOSCOPY (EGD) WITH PROPOFOL;  Surgeon: Regis Bill, MD;  Location: [facility] ENDOSCOPY;  Service: Endoscopy;  Laterality:  N/A;  REQUEST AM   ESOPHAGOGASTRODUODENOSCOPY (EGD) WITH PROPOFOL N/A 10/05/2021   Procedure: ESOPHAGOGASTRODUODENOSCOPY (EGD) WITH PROPOFOL;  Surgeon: Regis Bill, MD;  Location: [facility] ENDOSCOPY;  Service: Endoscopy;  Laterality: N/A;   FRACTURE SURGERY Right 05/28/2019   distal radius fracture   HERNIA REPAIR     left inguinal hernia repair as  an infant   KYPHOPLASTY N/A 02/11/2016   Procedure: KYPHOPLASTY  L2,T9;  Surgeon: Kennedy Bucker, MD;  Location: [facility] ORS;  Service: Orthopedics;  Laterality: N/A;   KYPHOPLASTY N/A 03/15/2016   Procedure: KYPHOPLASTY L3;  Surgeon: Kennedy Bucker, MD;  Location: [facility] ORS;  Service: Orthopedics;  Laterality: N/A;   OPEN REDUCTION INTERNAL FIXATION (ORIF) DISTAL RADIAL FRACTURE Right 05/28/2019   Procedure: OPEN REDUCTION INTERNAL FIXATION (ORIF) DISTAL RADIAL FRACTURE;  Surgeon: Kennedy Bucker, MD;  Location: [facility] ORS;  Service: Orthopedics;  Laterality: Right;   skin cancer removed     TONSILLECTOMY       Subjective Assessment -      Subjective Pt reports not feeling the greatest today. He reports he had not had BM in 4 days. He was instructed to contact MD regarding this issue, particularly if it does not resolve. He was warned regarding risk of intestinal blockages.    Pertinent History Charles Stanton is a 66yoM who was prescribed with Parkinson's Disease ~ 10 years.  Pt is followed by neurology, reports good reponse to medications which help control his tremors somewhat. Pt uses a SPC for AMB, also uses a walker for longer distance walking. Pt goes into community almost daily (they like to eat out) which includes lots of SPC [place] eand navigation of 4 stairs at home.    Currently in Pain? No/denies             TREATMENT DATE: 05/11/2022    Exercise/Activity Sets/Reps/Time/ Resistance Assistance Charge type Comments  Octane fitness machine ( seated elyptical Level 2 x 6 min  Rest break x 2 min at minute 3 secondary to fatigue   TE For aerobic priming and instruction in future gym based exercise program  Tilt 3 height 2 with UE and LE reciprocal movement   Seated PWR! Moves flow  X 10 ea   NMR Mirror and verbal cues throughout   Standing hip flexor stretch on steps   X 45 sec ea LE   TE To improve available ROM .   Ambualtion with AW and SCP 3# AW x 170 ft   TE Cues for sequencing of cane,  difficulty with correct sequencing today,.   Rows GTB 2 x 10   TE   LAQ X 15 3# AW   TE    Treatment Provided this session    Pt educated throughout session about proper posture and technique with exercises. Improved exercise technique, movement at target joints, use of target muscles after min to mod verbal, visual, tactile cues. Note: Portions of this document were prepared using Dragon voice recognition software and although reviewed may contain unintentional dictation errors in syntax, grammar, or spelling.          PT Education -     Education Details Pt educated regarding benefits of virtual urgent care and how to access virtual urgent care.      Person(s) Educated Patient    Methods Explanation;Demonstration ; handout   Comprehension Verbalized understanding;Returned demonstration;Need further instruction              PT Short Term Goals -  PT SHORT TERM GOAL #1   Title Pt to report regular performance of exercises prescribed for home and a sense of improvements in strength and balance AEB them not being as challenging anymore.    Baseline Will issue on visit 2    Time 4    Period Weeks    Status met   Target Date 03/15/22      PT SHORT TERM GOAL #2   Title Pt to demonstrate improved power AEB improved 5xSTS hands free in <18sec.    Baseline 03/01/22: 22sec hands free, author provideing foot block bilat 03/31/22: 14.82sec 4/23:17.3 sec   Time 4    Period Weeks    Status Met    Target Date 03/29/22               PT Long Term Goals - Target goal date for all remaining long term goals is 07/19/2022        PT LONG TERM GOAL #1   Title Pt to improve score on FOTO survey to 57 to indicate reduced difficulty with basic mobility required for ADL performance.    Baseline 03/01/22:49 04/29/22:56%   Time 8    Period Weeks    Status ONGOING          PT LONG TERM GOAL #2   Title Pt to improve tolerance to overground AMB c SPC AEB performance  >843ft.    Baseline 2/27: fatigued after 410ft lap performed in four minutes ten seconds. 03/31/22: 767ft with SPC no rest break 04/26/22: 794 ft    Time 8    Period Weeks    Status Progressing/ ONGOING         PT LONG TERM GOAL #3   Title Pt to demonstrate improved leg power AEB 5xSTS from chair <14sec hands free and without need for minGuard assist for safety and without need for foot block.    Baseline 03/01/22: 22sec hands free, feet blocked, minGuard assist  03/31/22: 14.82sec 4/23:17.3 sec   Time 8     Period Weeks    Status ONGOING          PT LONG TERM GOAL #4   Title Pt to demonstrate improvement in balance performance AEB >6 improvement BERG Balance for balance assessment.    Baseline 41 on 03/03/22. 3/38/24: 42 4/23: 45   Time 8    Period Weeks    Status PROGRESSING / ONGOING          5.  Pt will improve distance with LRAD to 900 feet or greater in order to indicate further progress to community ambulation distances Baseline: 794 ft with SPC  Goal status: INITIAL    Plan     Clinical Impression Statement Patient presents with good motivation for completion of physical therapy activities.  Patient continues to progress with lower extremity strengthening, endurance, and Parkinson's disease specific exercises and activities.Pt will continue to benefit from skilled physical therapy intervention to address impairments, improve QOL, and attain therapy goals.      Personal Factors and Comorbidities Time since onset of injury/illness/exacerbation;Past/Current Experience    Examination-Activity Limitations Locomotion Level;Transfers;Bed Mobility;Reach Overhead;Hygiene/Grooming;Dressing;Toileting;Stairs;Bend    Stability/Clinical Decision Making Evolving/Moderate complexity    Clinical Decision Making Moderate    Rehab Potential Good    PT Frequency 2x / week    PT Duration 8 weeks    PT Treatment/Interventions ADLs/Self Care Home Management;Moist Heat;Gait training;Stair  training;Functional mobility training;Therapeutic activities;Therapeutic exercise;Balance training;Neuromuscular re-education;Patient/family education    PT Next  Visit Plan .  STS PWR! Up with eventual addition of PWR! Step. Work on weight shift cues with STS. Dynamic balance/gait training as indicated.    PT Home Exercise Plan 03/01/22: discussed finding and elevated sit surface for future STS exercises (something ~18-19 inches high)    Consulted and Agree with Plan of Care Patient             Patient will benefit from skilled therapeutic intervention in order to improve the following deficits and impairments:  balance, strength, coordination, gait, transfers. Posture. ROM   Visit Diagnosis: Unsteadiness on feet  Difficulty in walking, not elsewhere classified  Other abnormalities of gait and mobility  Muscle weakness (generalized)    Problem List Patient Active Problem List   Diagnosis Date Noted   Acute respiratory failure due to COVID-19 (HCC) 05/28/2019   Alcohol dependence with uncomplicated withdrawal (HCC) 01/23/2018   Alcohol withdrawal (HCC) 01/23/2018   GIB (gastrointestinal bleeding) 12/09/2017   A-fib (HCC) 12/09/2017   Esophageal obstruction 10/10/2016   Esophageal ulceration 10/10/2016   GI bleed 09/20/2016   Low vitamin B12 level 03/18/2016   S/P kyphoplasty 03/18/2016   Basal cell carcinoma 02/08/2016   Atrial fibrillation (HCC) 01/27/2016   Colitis 01/27/2016   HLD (hyperlipidemia) 01/27/2016   Anxiety 01/27/2016   Hypokalemia 01/27/2016   Major depressive disorder, recurrent episode, moderate (HCC) 04/14/2015   Anxiety, generalized 12/06/2013   Parkinson disease 08/07/2013   Dysphagia 05/12/2013   Esophageal mass 05/12/2013   GERD (gastroesophageal reflux disease) 05/12/2013   Vomiting 05/12/2013   Open bite of lower leg 05/10/2013   Dog bite of lower leg 05/10/2013   Abnormal finding on liver function 03/25/2013   Benign neoplasm 03/25/2013    Dizziness 03/25/2013   Type 2 diabetes mellitus (HCC) 03/25/2013   BP (high blood pressure) 03/25/2013   Awareness of heartbeats 03/25/2013   Pure hypercholesterolemia 03/25/2013   Infective tonsillitis 03/25/2013   Adenomatous polyp 03/25/2013      Norman Herrlich, PT 05/17/2022, 2:52 PM  [place] [facility] [address] [place], [place], [address] Phone: [phone]   Fax:  [phone]

## 2022-05-18 ENCOUNTER — Ambulatory Visit: Payer: Medicare Other

## 2022-05-19 ENCOUNTER — Emergency Department
Admission: EM | Admit: 2022-05-19 | Discharge: 2022-05-19 | Disposition: A | Discharge status: Home / Self Care | Payer: Medicare Other | Attending: Emergency Medicine | Admitting: Emergency Medicine

## 2022-05-19 ENCOUNTER — Ambulatory Visit: Payer: Medicare Other | Admitting: Physical Therapy

## 2022-05-19 ENCOUNTER — Ambulatory Visit: Payer: Medicare Other

## 2022-05-19 ENCOUNTER — Other Ambulatory Visit: Payer: Self-pay

## 2022-05-19 ENCOUNTER — Emergency Department: Payer: Medicare Other

## 2022-05-19 ENCOUNTER — Ambulatory Visit: Payer: Medicare Other | Admitting: Occupational Therapy

## 2022-05-19 DIAGNOSIS — E860 Dehydration: Secondary | ICD-10-CM

## 2022-05-19 DIAGNOSIS — G20C Parkinsonism, unspecified: Secondary | ICD-10-CM | POA: Insufficient documentation

## 2022-05-19 DIAGNOSIS — I10 Essential (primary) hypertension: Secondary | ICD-10-CM | POA: Insufficient documentation

## 2022-05-19 DIAGNOSIS — K5900 Constipation, unspecified: Secondary | ICD-10-CM | POA: Diagnosis not present

## 2022-05-19 DIAGNOSIS — R109 Unspecified abdominal pain: Secondary | ICD-10-CM | POA: Diagnosis present

## 2022-05-19 DIAGNOSIS — R2681 Unsteadiness on feet: Secondary | ICD-10-CM

## 2022-05-19 LAB — CBC
HCT: 35.8 % — ABNORMAL LOW (ref 39.0–52.0)
Hemoglobin: 11.1 g/dL — ABNORMAL LOW (ref 13.0–17.0)
MCH: 26.3 pg (ref 26.0–34.0)
MCHC: 31 g/dL (ref 30.0–36.0)
MCV: 84.8 fL (ref 80.0–100.0)
Platelets: 237 10*3/uL (ref 150–400)
RBC: 4.22 MIL/uL (ref 4.22–5.81)
RDW: 17.4 % — ABNORMAL HIGH (ref 11.5–15.5)
WBC: 5.4 10*3/uL (ref 4.0–10.5)
nRBC: 0 % (ref 0.0–0.2)

## 2022-05-19 LAB — COMPREHENSIVE METABOLIC PANEL
ALT: 6 U/L (ref 0–44)
AST: 36 U/L (ref 15–41)
Albumin: 4 g/dL (ref 3.5–5.0)
Alkaline Phosphatase: 83 U/L (ref 38–126)
Anion gap: 13 (ref 5–15)
BUN: 12 mg/dL (ref 8–23)
CO2: 23 mmol/L (ref 22–32)
Calcium: 9.2 mg/dL (ref 8.9–10.3)
Chloride: 102 mmol/L (ref 98–111)
Creatinine, Ser: 0.67 mg/dL (ref 0.61–1.24)
GFR, Estimated: >60 mL/min (ref >60)
Glucose, Bld: 97 mg/dL (ref 70–99)
Potassium: 3.8 mmol/L (ref 3.5–5.1)
Sodium: 138 mmol/L (ref 135–145)
Total Bilirubin: 1.5 mg/dL — ABNORMAL HIGH (ref 0.3–1.2)
Total Protein: 7 g/dL (ref 6.5–8.1)

## 2022-05-19 LAB — LIPASE, BLOOD: Lipase: 36 U/L (ref 11–51)

## 2022-05-19 MED ORDER — MAGNESIUM CITRATE PO SOLN
1.0000 | Freq: Once | ORAL | Status: AC
  Administered 2022-05-19: 1 via ORAL
  Filled 2022-05-19: qty 296

## 2022-05-19 MED ORDER — SODIUM CHLORIDE 0.9 % IV BOLUS
1000.0000 mL | Freq: Once | INTRAVENOUS | Status: AC
  Administered 2022-05-19: 1000 mL via INTRAVENOUS

## 2022-05-19 NOTE — Therapy (Signed)
Outpatient Physical Therapy    Patient Details  Name: Charles Stanton MRN: [id number] Date of Birth: 1955-06-15 Referring Provider (PT): Dr. Cristopher Peru   Encounter Date: 05/19/2022  END OF SESSION:    PT End of Session - 05/19/22 1733     Visit Number 0   Pt seen but PT not indicated due to BP and lack of BM in last 7 days and some pain associated with this.   Number of Visits 40    Date for PT Re-Evaluation 07/19/22    Authorization Type Medicare A & B primary; federal employee secondary    Authorization Time Period -07/19/22    Progress Note Due on Visit 30    Equipment Utilized During Treatment Gait belt    Activity Tolerance Patient tolerated treatment well;No increased pain    Behavior During Therapy [facility] for tasks assessed/performed                          Past Medical History:  Diagnosis Date   Abnormal liver function    Anemia    Anxiety    Atrial fibrillation (HCC)    Cancer (HCC) 2017   skin   Colonic mass    Depression    Dizziness    Dry cough    History of colon polyps    Hypercholesteremia    Hyperlipemia    Hypertension    Palpitations    Parkinson's disease    Parkinson's disease    PVD (peripheral vascular disease) (HCC)    Tonsillitis 03/25/2013   Tremors of nervous system     Past Surgical History:  Procedure Laterality Date   BACK SURGERY     Kyphoplasty   March 2018   COLONOSCOPY     COLONOSCOPY WITH PROPOFOL N/A 12/12/2017   Procedure: COLONOSCOPY WITH PROPOFOL;  Surgeon: Wyline Mood, MD;  Location: [facility] ENDOSCOPY;  Service: Gastroenterology;  Laterality: N/A;   ESOPHAGOGASTRODUODENOSCOPY N/A 12/11/2017   Procedure: ESOPHAGOGASTRODUODENOSCOPY (EGD);  Surgeon: Wyline Mood, MD;  Location: [facility] ENDOSCOPY;  Service: Gastroenterology;  Laterality: N/A;   ESOPHAGOGASTRODUODENOSCOPY N/A 10/08/2021   Procedure: ESOPHAGOGASTRODUODENOSCOPY (EGD);  Surgeon: Regis Bill, MD;  Location: [facility] ENDOSCOPY;  Service: Endoscopy;   Laterality: N/A;   ESOPHAGOGASTRODUODENOSCOPY (EGD) WITH PROPOFOL N/A 09/21/2016   Procedure: ESOPHAGOGASTRODUODENOSCOPY (EGD) WITH PROPOFOL;  Surgeon: Wyline Mood, MD;  Location: [facility] ENDOSCOPY;  Service: Gastroenterology;  Laterality: N/A;   ESOPHAGOGASTRODUODENOSCOPY (EGD) WITH PROPOFOL N/A 11/07/2016   Procedure: ESOPHAGOGASTRODUODENOSCOPY (EGD) WITH PROPOFOL;  Surgeon: Wyline Mood, MD;  Location: [facility] ENDOSCOPY;  Service: Gastroenterology;  Laterality: N/A;   ESOPHAGOGASTRODUODENOSCOPY (EGD) WITH PROPOFOL N/A 10/15/2019   Procedure: ESOPHAGOGASTRODUODENOSCOPY (EGD) WITH PROPOFOL;  Surgeon: Regis Bill, MD;  Location: [facility] ENDOSCOPY;  Service: Endoscopy;  Laterality: N/A;   ESOPHAGOGASTRODUODENOSCOPY (EGD) WITH PROPOFOL N/A 10/28/2019   Procedure: ESOPHAGOGASTRODUODENOSCOPY (EGD) WITH PROPOFOL;  Surgeon: Regis Bill, MD;  Location: [facility] ENDOSCOPY;  Service: Endoscopy;  Laterality: N/A;   ESOPHAGOGASTRODUODENOSCOPY (EGD) WITH PROPOFOL N/A 12/02/2019   Procedure: ESOPHAGOGASTRODUODENOSCOPY (EGD) WITH PROPOFOL;  Surgeon: Regis Bill, MD;  Location: [facility] ENDOSCOPY;  Service: Endoscopy;  Laterality: N/A;   ESOPHAGOGASTRODUODENOSCOPY (EGD) WITH PROPOFOL N/A 02/18/2020   Procedure: ESOPHAGOGASTRODUODENOSCOPY (EGD) WITH PROPOFOL;  Surgeon: Regis Bill, MD;  Location: [facility] ENDOSCOPY;  Service: Endoscopy;  Laterality: N/A;   ESOPHAGOGASTRODUODENOSCOPY (EGD) WITH PROPOFOL N/A 08/02/2021   Procedure: ESOPHAGOGASTRODUODENOSCOPY (EGD) WITH PROPOFOL;  Surgeon: Regis Bill, MD;  Location: [facility] ENDOSCOPY;  Service: Endoscopy;  Laterality: N/A;  REQUEST AM   ESOPHAGOGASTRODUODENOSCOPY (EGD) WITH PROPOFOL N/A 10/05/2021   Procedure: ESOPHAGOGASTRODUODENOSCOPY (EGD) WITH PROPOFOL;  Surgeon: Regis Bill, MD;  Location: [facility] ENDOSCOPY;  Service: Endoscopy;  Laterality: N/A;   FRACTURE SURGERY Right 05/28/2019   distal radius fracture   HERNIA REPAIR     left inguinal  hernia repair as an infant   KYPHOPLASTY N/A 02/11/2016   Procedure: KYPHOPLASTY  L2,T9;  Surgeon: Kennedy Bucker, MD;  Location: [facility] ORS;  Service: Orthopedics;  Laterality: N/A;   KYPHOPLASTY N/A 03/15/2016   Procedure: KYPHOPLASTY L3;  Surgeon: Kennedy Bucker, MD;  Location: [facility] ORS;  Service: Orthopedics;  Laterality: N/A;   OPEN REDUCTION INTERNAL FIXATION (ORIF) DISTAL RADIAL FRACTURE Right 05/28/2019   Procedure: OPEN REDUCTION INTERNAL FIXATION (ORIF) DISTAL RADIAL FRACTURE;  Surgeon: Kennedy Bucker, MD;  Location: [facility] ORS;  Service: Orthopedics;  Laterality: Right;   skin cancer removed     TONSILLECTOMY       Subjective Assessment -      Subjective Pt not feeling well still. Has not had BM in 7 days and feel dizzy   Pertinent History Charles Stanton is a 66yoM who was prescribed with Parkinson's Disease ~ 10 years.  Pt is followed by neurology, reports good reponse to medications which help control his tremors somewhat. Pt uses a SPC for AMB, also uses a walker for longer distance walking. Pt goes into community almost daily (they like to eat out) which includes lots of SPC [place] eand navigation of 4 stairs at home.    Currently in Pain? Yes, 6-7/10 in stomach area           BP assessment:  167/130 initially 177/118 now with HR 116  6-7/10 pain in the stomach, last BM 05/12/22  -PT spoke with pt PCP office nurse and explained symptoms and the recommendation was to be assessed at the ED. Pt brought with his wife to the ED.            PT Education -     Education Details Pt educated regarding benefits of virtual urgent care and how to access virtual urgent care.      Person(s) Educated Patient    Methods Explanation;Demonstration ; handout   Comprehension Verbalized understanding;Returned demonstration;Need further instruction              PT Short Term Goals -       PT SHORT TERM GOAL #1   Title Pt to report regular performance of exercises prescribed for home and a  sense of improvements in strength and balance AEB them not being as challenging anymore.    Baseline Will issue on visit 2    Time 4    Period Weeks    Status met   Target Date 03/15/22      PT SHORT TERM GOAL #2   Title Pt to demonstrate improved power AEB improved 5xSTS hands free in <18sec.    Baseline 03/01/22: 22sec hands free, author provideing foot block bilat 03/31/22: 14.82sec 4/23:17.3 sec   Time 4    Period Weeks    Status Met    Target Date 03/29/22               PT Long Term Goals - Target goal date for all remaining long term goals is 07/19/2022        PT LONG TERM GOAL #1   Title Pt to improve score on FOTO survey to 57 to indicate reduced difficulty  with basic mobility required for ADL performance.    Baseline 03/01/22:49 04/29/22:56%   Time 8    Period Weeks    Status ONGOING          PT LONG TERM GOAL #2   Title Pt to improve tolerance to overground AMB c SPC AEB performance >889ft.    Baseline 2/27: fatigued after 482ft lap performed in four minutes ten seconds. 03/31/22: 732ft with SPC no rest break 04/26/22: 794 ft    Time 8    Period Weeks    Status Progressing/ ONGOING         PT LONG TERM GOAL #3   Title Pt to demonstrate improved leg power AEB 5xSTS from chair <14sec hands free and without need for minGuard assist for safety and without need for foot block.    Baseline 03/01/22: 22sec hands free, feet blocked, minGuard assist  03/31/22: 14.82sec 4/23:17.3 sec   Time 8     Period Weeks    Status ONGOING          PT LONG TERM GOAL #4   Title Pt to demonstrate improvement in balance performance AEB >6 improvement BERG Balance for balance assessment.    Baseline 41 on 03/03/22. 3/38/24: 42 4/23: 45   Time 8    Period Weeks    Status PROGRESSING / ONGOING          5.  Pt will improve distance with LRAD to 900 feet or greater in order to indicate further progress to community ambulation distances Baseline: 794 ft with SPC  Goal status:  INITIAL    Plan     Clinical Impression Statement Pt treatment not completed today secondary to high BP and pt pain and lack of BM in last 7 days. Pt brought to ED per recommendation of PCP office and recommendation of PT. No charge or visit count associated with note.    Personal Factors and Comorbidities Time since onset of injury/illness/exacerbation;Past/Current Experience    Examination-Activity Limitations Locomotion Level;Transfers;Bed Mobility;Reach Overhead;Hygiene/Grooming;Dressing;Toileting;Stairs;Bend    Stability/Clinical Decision Making Evolving/Moderate complexity    Clinical Decision Making Moderate    Rehab Potential Good    PT Frequency 2x / week    PT Duration 8 weeks    PT Treatment/Interventions ADLs/Self Care Home Management;Moist Heat;Gait training;Stair training;Functional mobility training;Therapeutic activities;Therapeutic exercise;Balance training;Neuromuscular re-education;Patient/family education    PT Next Visit Plan .  STS PWR! Up with eventual addition of PWR! Step. Work on weight shift cues with STS. Dynamic balance/gait training as indicated.    PT Home Exercise Plan 03/01/22: discussed finding and elevated sit surface for future STS exercises (something ~18-19 inches high)    Consulted and Agree with Plan of Care Patient             Patient will benefit from skilled therapeutic intervention in order to improve the following deficits and impairments:  balance, strength, coordination, gait, transfers. Posture. ROM   Visit Diagnosis: Unsteadiness on feet    Problem List Patient Active Problem List   Diagnosis Date Noted   Acute respiratory failure due to COVID-19 (HCC) 05/28/2019   Alcohol dependence with uncomplicated withdrawal (HCC) 01/23/2018   Alcohol withdrawal (HCC) 01/23/2018   GIB (gastrointestinal bleeding) 12/09/2017   A-fib (HCC) 12/09/2017   Esophageal obstruction 10/10/2016   Esophageal ulceration 10/10/2016   GI bleed 09/20/2016    Low vitamin B12 level 03/18/2016   S/P kyphoplasty 03/18/2016   Basal cell carcinoma 02/08/2016   Atrial fibrillation (HCC) 01/27/2016  Colitis 01/27/2016   HLD (hyperlipidemia) 01/27/2016   Anxiety 01/27/2016   Hypokalemia 01/27/2016   Major depressive disorder, recurrent episode, moderate (HCC) 04/14/2015   Anxiety, generalized 12/06/2013   Parkinson disease 08/07/2013   Dysphagia 05/12/2013   Esophageal mass 05/12/2013   GERD (gastroesophageal reflux disease) 05/12/2013   Vomiting 05/12/2013   Open bite of lower leg 05/10/2013   Dog bite of lower leg 05/10/2013   Abnormal finding on liver function 03/25/2013   Benign neoplasm 03/25/2013   Dizziness 03/25/2013   Type 2 diabetes mellitus (HCC) 03/25/2013   BP (high blood pressure) 03/25/2013   Awareness of heartbeats 03/25/2013   Pure hypercholesterolemia 03/25/2013   Infective tonsillitis 03/25/2013   Adenomatous polyp 03/25/2013      Norman Herrlich, PT 05/19/2022, 5:34 PM  [facility] at [facility] [address] [place], [place], [address] Phone: [phone]   Fax:  [phone]

## 2022-05-19 NOTE — ED Provider Notes (Signed)
[facility] Provider Note    Event Date/Time   First MD Initiated Contact with Patient 05/19/22 1332     (approximate)   History   Abdominal Pain   HPI  Charles Stanton is a 67 y.o. male with history of hypertension, hyperlipidemia, Parkinson's disease, colon mass, A-fib, presents emergency department with constipation for 1 week.  Patient was at physical therapy this morning got dizzy and they noticed his blood pressure was up and told him to come the emergency department.  He has not used any laxatives.  Denies any fever, chills, headache vomiting or diarrhea.      Physical Exam   Triage Vital Signs: ED Triage Vitals  Enc Vitals Group     BP 05/19/22 1223 (!) 162/98     Pulse Rate 05/19/22 1223 (!) 105     Resp 05/19/22 1223 16     Temp 05/19/22 1223 98.1 F (36.7 C)     Temp Source 05/19/22 1223 Oral     SpO2 05/19/22 1223 97 %     Weight 05/19/22 1221 119 lb 14.9 oz (54.4 kg)     Height 05/19/22 1221 5\' 6"  (1.676 m)     Head Circumference --      Peak Flow --      Pain Score 05/19/22 1221 7     Pain Loc --      Pain Edu? --      Excl. in GC? --     Most recent vital signs: Vitals:   05/19/22 1223  BP: (!) 162/98  Pulse: (!) 105  Resp: 16  Temp: 98.1 F (36.7 C)  SpO2: 97%     General: Awake, no distress.   CV:  Good peripheral perfusion. regular rate and  rhythm Resp:  Normal effort. Lungs cta Abd:  No distention.  Nontender, bowel sounds present all 4 quadrants Other:      ED Results / Procedures / Treatments   Labs (all labs ordered are listed, but only abnormal results are displayed) Labs Reviewed  COMPREHENSIVE METABOLIC PANEL - Abnormal; Notable for the following components:      Result Value   Total Bilirubin 1.5 (*)    All other components within normal limits  CBC - Abnormal; Notable for the following components:   Hemoglobin 11.1 (*)    HCT 35.8 (*)    RDW 17.4 (*)    All other components within normal  limits  LIPASE, BLOOD  URINALYSIS, ROUTINE W REFLEX MICROSCOPIC     EKG     RADIOLOGY  X-[place] of the abdomen 1 view   PROCEDURES:   Procedures   MEDICATIONS ORDERED IN ED: Medications  magnesium citrate solution 1 Bottle (has no administration in time range)  sodium chloride 0.9 % bolus 1,000 mL (1,000 mLs Intravenous New Bag/Given 05/19/22 1408)     IMPRESSION / MDM / ASSESSMENT AND PLAN / ED COURSE  I reviewed the triage vital signs and the nursing notes.                              Differential diagnosis includes, but is not limited to, constipation, bowel obstruction, colonic mass, dizziness, hypertension  Patient's presentation is most consistent with acute presentation with potential threat to life or bodily function.   Patient's labs are reassuring  X-[place] of the abdomen 1 view  Will give the patient 1 L normal saline as a feel he  may be a little dehydrated  Patient states the dizziness is improved.  Feel the dizziness was most likely due to dehydration.  Did discuss the constipation.  Since he has not used any laxatives etc. does not appear to be a stool ball or bowel obstruction we will give him mag citrate to take home with him.  In shared decision-making he and his wife agree we do not need further workup at this time.  He is to return emergency department if worsening.  He was discharged stable condition.     FINAL CLINICAL IMPRESSION(S) / ED DIAGNOSES   Final diagnoses:  Constipation, unspecified constipation type  Dehydration     Rx / DC Orders   ED Discharge Orders     None        Note:  This document was prepared using Dragon voice recognition software and may include unintentional dictation errors.    Faythe Ghee, PA-C 05/19/22 1512    Pilar Jarvis, MD 05/19/22 [phone]

## 2022-05-19 NOTE — ED Triage Notes (Signed)
States was going to PT, felt dizzy. BP checked and it was elevated.  Also no BM x 1 week.

## 2022-05-24 ENCOUNTER — Ambulatory Visit: Payer: Medicare Other | Admitting: Physical Therapy

## 2022-05-24 ENCOUNTER — Encounter: Payer: Self-pay | Admitting: Physical Therapy

## 2022-05-24 DIAGNOSIS — R2681 Unsteadiness on feet: Secondary | ICD-10-CM

## 2022-05-24 DIAGNOSIS — R262 Difficulty in walking, not elsewhere classified: Secondary | ICD-10-CM

## 2022-05-24 DIAGNOSIS — R2689 Other abnormalities of gait and mobility: Secondary | ICD-10-CM

## 2022-05-24 DIAGNOSIS — M6281 Muscle weakness (generalized): Secondary | ICD-10-CM

## 2022-05-24 NOTE — Therapy (Signed)
Outpatient Physical Therapy Treatment    Patient Details  Name: Charles Stanton MRN: [id number] Date of Birth: 05/22/1955 Referring Provider (PT): Dr. Cristopher Peru   Encounter Date: 05/24/2022  END OF SESSION:    PT End of Session - 05/24/22 1130     Visit Number 23    Number of Visits 40    Date for PT Re-Evaluation 07/19/22    Authorization Type Medicare A & B primary; federal employee secondary    Authorization Time Period -07/19/22    Progress Note Due on Visit 30    PT Start Time 1145    PT Stop Time 1226    PT Time Calculation (min) 41 min    Equipment Utilized During Treatment Gait belt    Activity Tolerance Patient tolerated treatment well;No increased pain    Behavior During Therapy [facility] for tasks assessed/performed                         Past Medical History:  Diagnosis Date   Abnormal liver function    Anemia    Anxiety    Atrial fibrillation (HCC)    Cancer (HCC) 2017   skin   Colonic mass    Depression    Dizziness    Dry cough    History of colon polyps    Hypercholesteremia    Hyperlipemia    Hypertension    Palpitations    Parkinson's disease    Parkinson's disease    PVD (peripheral vascular disease) (HCC)    Tonsillitis 03/25/2013   Tremors of nervous system     Past Surgical History:  Procedure Laterality Date   BACK SURGERY     Kyphoplasty   March 2018   COLONOSCOPY     COLONOSCOPY WITH PROPOFOL N/A 12/12/2017   Procedure: COLONOSCOPY WITH PROPOFOL;  Surgeon: Wyline Mood, MD;  Location: [facility] ENDOSCOPY;  Service: Gastroenterology;  Laterality: N/A;   ESOPHAGOGASTRODUODENOSCOPY N/A 12/11/2017   Procedure: ESOPHAGOGASTRODUODENOSCOPY (EGD);  Surgeon: Wyline Mood, MD;  Location: [facility] ENDOSCOPY;  Service: Gastroenterology;  Laterality: N/A;   ESOPHAGOGASTRODUODENOSCOPY N/A 10/08/2021   Procedure: ESOPHAGOGASTRODUODENOSCOPY (EGD);  Surgeon: Regis Bill, MD;  Location: [facility] ENDOSCOPY;  Service: Endoscopy;  Laterality:  N/A;   ESOPHAGOGASTRODUODENOSCOPY (EGD) WITH PROPOFOL N/A 09/21/2016   Procedure: ESOPHAGOGASTRODUODENOSCOPY (EGD) WITH PROPOFOL;  Surgeon: Wyline Mood, MD;  Location: [facility] ENDOSCOPY;  Service: Gastroenterology;  Laterality: N/A;   ESOPHAGOGASTRODUODENOSCOPY (EGD) WITH PROPOFOL N/A 11/07/2016   Procedure: ESOPHAGOGASTRODUODENOSCOPY (EGD) WITH PROPOFOL;  Surgeon: Wyline Mood, MD;  Location: [facility] ENDOSCOPY;  Service: Gastroenterology;  Laterality: N/A;   ESOPHAGOGASTRODUODENOSCOPY (EGD) WITH PROPOFOL N/A 10/15/2019   Procedure: ESOPHAGOGASTRODUODENOSCOPY (EGD) WITH PROPOFOL;  Surgeon: Regis Bill, MD;  Location: [facility] ENDOSCOPY;  Service: Endoscopy;  Laterality: N/A;   ESOPHAGOGASTRODUODENOSCOPY (EGD) WITH PROPOFOL N/A 10/28/2019   Procedure: ESOPHAGOGASTRODUODENOSCOPY (EGD) WITH PROPOFOL;  Surgeon: Regis Bill, MD;  Location: [facility] ENDOSCOPY;  Service: Endoscopy;  Laterality: N/A;   ESOPHAGOGASTRODUODENOSCOPY (EGD) WITH PROPOFOL N/A 12/02/2019   Procedure: ESOPHAGOGASTRODUODENOSCOPY (EGD) WITH PROPOFOL;  Surgeon: Regis Bill, MD;  Location: [facility] ENDOSCOPY;  Service: Endoscopy;  Laterality: N/A;   ESOPHAGOGASTRODUODENOSCOPY (EGD) WITH PROPOFOL N/A 02/18/2020   Procedure: ESOPHAGOGASTRODUODENOSCOPY (EGD) WITH PROPOFOL;  Surgeon: Regis Bill, MD;  Location: [facility] ENDOSCOPY;  Service: Endoscopy;  Laterality: N/A;   ESOPHAGOGASTRODUODENOSCOPY (EGD) WITH PROPOFOL N/A 08/02/2021   Procedure: ESOPHAGOGASTRODUODENOSCOPY (EGD) WITH PROPOFOL;  Surgeon: Regis Bill, MD;  Location: [facility] ENDOSCOPY;  Service: Endoscopy;  Laterality:  N/A;  REQUEST AM   ESOPHAGOGASTRODUODENOSCOPY (EGD) WITH PROPOFOL N/A 10/05/2021   Procedure: ESOPHAGOGASTRODUODENOSCOPY (EGD) WITH PROPOFOL;  Surgeon: Regis Bill, MD;  Location: [facility] ENDOSCOPY;  Service: Endoscopy;  Laterality: N/A;   FRACTURE SURGERY Right 05/28/2019   distal radius fracture   HERNIA REPAIR     left inguinal hernia repair as  an infant   KYPHOPLASTY N/A 02/11/2016   Procedure: KYPHOPLASTY  L2,T9;  Surgeon: Kennedy Bucker, MD;  Location: [facility] ORS;  Service: Orthopedics;  Laterality: N/A;   KYPHOPLASTY N/A 03/15/2016   Procedure: KYPHOPLASTY L3;  Surgeon: Kennedy Bucker, MD;  Location: [facility] ORS;  Service: Orthopedics;  Laterality: N/A;   OPEN REDUCTION INTERNAL FIXATION (ORIF) DISTAL RADIAL FRACTURE Right 05/28/2019   Procedure: OPEN REDUCTION INTERNAL FIXATION (ORIF) DISTAL RADIAL FRACTURE;  Surgeon: Kennedy Bucker, MD;  Location: [facility] ORS;  Service: Orthopedics;  Laterality: Right;   skin cancer removed     TONSILLECTOMY       Subjective Assessment -      Subjective Pt reports not feeling the greatest today. He reports he had not had BM in 4 days. He was instructed to contact MD regarding this issue, particularly if it does not resolve. He was warned regarding risk of intestinal blockages.    Pertinent History Charles Stanton is a 66yoM who was prescribed with Parkinson's Disease ~ 10 years.  Pt is followed by neurology, reports good reponse to medications which help control his tremors somewhat. Pt uses a SPC for AMB, also uses a walker for longer distance walking. Pt goes into community almost daily (they like to eat out) which includes lots of SPC [place] eand navigation of 4 stairs at home.    Currently in Pain? No/denies             TREATMENT DATE: 05/11/2022  BP at start 149/71 with HR 99  Exercise/Activity Sets/Reps/Time/ Resistance Assistance Charge type Comments  Octane fitness machine ( seated elyptical Level 2 x 6 min  Rest break x 2 min at minute 3 secondary to fatigue   TE For aerobic priming and instruction in future gym based exercise program  Tilt 3 height 2 with UE and LE reciprocal movement   Seated PWR! Up X 10   TA For carrover to STS activity later in session, working on forward weight shift for standing.   Standing hip flexor stretch on steps   X 45 sec ea LE   TE To improve available ROM .    Ambualtion with AW and SPC 2.5# AW 2 x 170 ft  CGA TA Cues for sequencing cane with steps.   Rows GTB 2 x 10   TE   LAQ X 15 2.5# AW   TE    STS with PWR! Up arm movement  2 x 10  CGA TA   PWR! Step standing  X 10 ea  CGA TA   Treatment Provided this session    Pt educated throughout session about proper posture and technique with exercises. Improved exercise technique, movement at target joints, use of target muscles after min to mod verbal, visual, tactile cues. Note: Portions of this document were prepared using Dragon voice recognition software and although reviewed may contain unintentional dictation errors in syntax, grammar, or spelling.          PT Education -     Education Details Pt educated regarding benefits of virtual urgent care and how to access virtual urgent care.      Person(s) Educated  Patient    Methods Explanation;Demonstration ; handout   Comprehension Verbalized understanding;Returned demonstration;Need further instruction              PT Short Term Goals -       PT SHORT TERM GOAL #1   Title Pt to report regular performance of exercises prescribed for home and a sense of improvements in strength and balance AEB them not being as challenging anymore.    Baseline Will issue on visit 2    Time 4    Period Weeks    Status met   Target Date 03/15/22      PT SHORT TERM GOAL #2   Title Pt to demonstrate improved power AEB improved 5xSTS hands free in <18sec.    Baseline 03/01/22: 22sec hands free, author provideing foot block bilat 03/31/22: 14.82sec 4/23:17.3 sec   Time 4    Period Weeks    Status Met    Target Date 03/29/22               PT Long Term Goals - Target goal date for all remaining long term goals is 07/19/2022        PT LONG TERM GOAL #1   Title Pt to improve score on FOTO survey to 57 to indicate reduced difficulty with basic mobility required for ADL performance.    Baseline 03/01/22:49 04/29/22:56%   Time 8    Period  Weeks    Status ONGOING          PT LONG TERM GOAL #2   Title Pt to improve tolerance to overground AMB c SPC AEB performance >861ft.    Baseline 2/27: fatigued after 485ft lap performed in four minutes ten seconds. 03/31/22: 740ft with SPC no rest break 04/26/22: 794 ft    Time 8    Period Weeks    Status Progressing/ ONGOING         PT LONG TERM GOAL #3   Title Pt to demonstrate improved leg power AEB 5xSTS from chair <14sec hands free and without need for minGuard assist for safety and without need for foot block.    Baseline 03/01/22: 22sec hands free, feet blocked, minGuard assist  03/31/22: 14.82sec  4/23:17.3 sec   Time 8     Period Weeks    Status ONGOING          PT LONG TERM GOAL #4   Title Pt to demonstrate improvement in balance performance AEB >6 improvement BERG Balance for balance assessment.    Baseline 41 on 03/03/22. 3/38/24: 42 4/23: 45   Time 8    Period Weeks    Status PROGRESSING / ONGOING          5.  Pt will improve distance with LRAD to 900 feet or greater in order to indicate further progress to community ambulation distances Baseline: 794 ft with SPC  Goal status: INITIAL    Plan     Clinical Impression Statement Patient presents with good motivation for completion of physical therapy activities. Pt feeling much better and has had BM and BP has normalized since last visit.  Patient continues to progress with lower extremity strengthening, endurance, and Parkinson's disease specific exercises and activities.Pt will continue to benefit from skilled physical therapy intervention to address impairments, improve QOL, and attain therapy goals.      Personal Factors and Comorbidities Time since onset of injury/illness/exacerbation;Past/Current Experience    Examination-Activity Limitations Locomotion Level;Transfers;Bed Mobility;Reach Overhead;Hygiene/Grooming;Dressing;Toileting;Stairs;Bend    Stability/Clinical Decision  Making Evolving/Moderate  complexity    Clinical Decision Making Moderate    Rehab Potential Good    PT Frequency 2x / week    PT Duration 8 weeks    PT Treatment/Interventions ADLs/Self Care Home Management;Moist Heat;Gait training;Stair training;Functional mobility training;Therapeutic activities;Therapeutic exercise;Balance training;Neuromuscular re-education;Patient/family education    PT Next Visit Plan .  STS PWR! Up with eventual addition of PWR! Step. Work on weight shift cues with STS. Dynamic balance/gait training as indicated.    PT Home Exercise Plan 03/01/22: discussed finding and elevated sit surface for future STS exercises (something ~18-19 inches high)    Consulted and Agree with Plan of Care Patient             Patient will benefit from skilled therapeutic intervention in order to improve the following deficits and impairments:  balance, strength, coordination, gait, transfers. Posture. ROM   Visit Diagnosis: Unsteadiness on feet  Difficulty in walking, not elsewhere classified  Other abnormalities of gait and mobility  Muscle weakness (generalized)    Problem List Patient Active Problem List   Diagnosis Date Noted   Acute respiratory failure due to COVID-19 (HCC) 05/28/2019   Alcohol dependence with uncomplicated withdrawal (HCC) 01/23/2018   Alcohol withdrawal (HCC) 01/23/2018   GIB (gastrointestinal bleeding) 12/09/2017   A-fib (HCC) 12/09/2017   Esophageal obstruction 10/10/2016   Esophageal ulceration 10/10/2016   GI bleed 09/20/2016   Low vitamin B12 level 03/18/2016   S/P kyphoplasty 03/18/2016   Basal cell carcinoma 02/08/2016   Atrial fibrillation (HCC) 01/27/2016   Colitis 01/27/2016   HLD (hyperlipidemia) 01/27/2016   Anxiety 01/27/2016   Hypokalemia 01/27/2016   Major depressive disorder, recurrent episode, moderate (HCC) 04/14/2015   Anxiety, generalized 12/06/2013   Parkinson disease 08/07/2013   Dysphagia 05/12/2013   Esophageal mass 05/12/2013   GERD  (gastroesophageal reflux disease) 05/12/2013   Vomiting 05/12/2013   Open bite of lower leg 05/10/2013   Dog bite of lower leg 05/10/2013   Abnormal finding on liver function 03/25/2013   Benign neoplasm 03/25/2013   Dizziness 03/25/2013   Type 2 diabetes mellitus (HCC) 03/25/2013   BP (high blood pressure) 03/25/2013   Awareness of heartbeats 03/25/2013   Pure hypercholesterolemia 03/25/2013   Infective tonsillitis 03/25/2013   Adenomatous polyp 03/25/2013      Norman Herrlich, PT 05/24/2022, 12:51 PM   [facility] [address] [place], [place], [address] Phone: [phone]   Fax:  [phone]

## 2022-05-26 ENCOUNTER — Ambulatory Visit: Payer: Medicare Other

## 2022-05-26 ENCOUNTER — Ambulatory Visit: Payer: Medicare Other | Admitting: Physical Therapy

## 2022-05-26 ENCOUNTER — Encounter: Payer: Self-pay | Admitting: Physical Therapy

## 2022-05-26 DIAGNOSIS — R2681 Unsteadiness on feet: Secondary | ICD-10-CM | POA: Diagnosis not present

## 2022-05-26 DIAGNOSIS — R278 Other lack of coordination: Secondary | ICD-10-CM

## 2022-05-26 DIAGNOSIS — M6281 Muscle weakness (generalized): Secondary | ICD-10-CM

## 2022-05-26 DIAGNOSIS — R262 Difficulty in walking, not elsewhere classified: Secondary | ICD-10-CM

## 2022-05-26 DIAGNOSIS — R2689 Other abnormalities of gait and mobility: Secondary | ICD-10-CM

## 2022-05-26 NOTE — Therapy (Signed)
Outpatient Physical Therapy Treatment    Patient Details  Name: Charles Stanton MRN: [id number] Date of Birth: 06-Jul-1955 Referring Provider (PT): Dr. Cristopher Peru   Encounter Date: 05/26/2022  END OF SESSION:    PT End of Session - 05/26/22 1254     Visit Number 24    Number of Visits 40    Date for PT Re-Evaluation 07/19/22    Authorization Type Medicare A & B primary; federal employee secondary    Authorization Time Period -07/19/22    Progress Note Due on Visit 30    PT Start Time 0145    PT Stop Time 0227    PT Time Calculation (min) 42 min    Equipment Utilized During Treatment Gait belt    Activity Tolerance Patient tolerated treatment well;No increased pain    Behavior During Therapy [facility] for tasks assessed/performed                         Past Medical History:  Diagnosis Date   Abnormal liver function    Anemia    Anxiety    Atrial fibrillation (HCC)    Cancer (HCC) 2017   skin   Colonic mass    Depression    Dizziness    Dry cough    History of colon polyps    Hypercholesteremia    Hyperlipemia    Hypertension    Palpitations    Parkinson's disease    Parkinson's disease    PVD (peripheral vascular disease) (HCC)    Tonsillitis 03/25/2013   Tremors of nervous system     Past Surgical History:  Procedure Laterality Date   BACK SURGERY     Kyphoplasty   March 2018   COLONOSCOPY     COLONOSCOPY WITH PROPOFOL N/A 12/12/2017   Procedure: COLONOSCOPY WITH PROPOFOL;  Surgeon: Wyline Mood, MD;  Location: [facility] ENDOSCOPY;  Service: Gastroenterology;  Laterality: N/A;   ESOPHAGOGASTRODUODENOSCOPY N/A 12/11/2017   Procedure: ESOPHAGOGASTRODUODENOSCOPY (EGD);  Surgeon: Wyline Mood, MD;  Location: [facility] ENDOSCOPY;  Service: Gastroenterology;  Laterality: N/A;   ESOPHAGOGASTRODUODENOSCOPY N/A 10/08/2021   Procedure: ESOPHAGOGASTRODUODENOSCOPY (EGD);  Surgeon: Regis Bill, MD;  Location: [facility] ENDOSCOPY;  Service: Endoscopy;  Laterality:  N/A;   ESOPHAGOGASTRODUODENOSCOPY (EGD) WITH PROPOFOL N/A 09/21/2016   Procedure: ESOPHAGOGASTRODUODENOSCOPY (EGD) WITH PROPOFOL;  Surgeon: Wyline Mood, MD;  Location: [facility] ENDOSCOPY;  Service: Gastroenterology;  Laterality: N/A;   ESOPHAGOGASTRODUODENOSCOPY (EGD) WITH PROPOFOL N/A 11/07/2016   Procedure: ESOPHAGOGASTRODUODENOSCOPY (EGD) WITH PROPOFOL;  Surgeon: Wyline Mood, MD;  Location: [facility] ENDOSCOPY;  Service: Gastroenterology;  Laterality: N/A;   ESOPHAGOGASTRODUODENOSCOPY (EGD) WITH PROPOFOL N/A 10/15/2019   Procedure: ESOPHAGOGASTRODUODENOSCOPY (EGD) WITH PROPOFOL;  Surgeon: Regis Bill, MD;  Location: [facility] ENDOSCOPY;  Service: Endoscopy;  Laterality: N/A;   ESOPHAGOGASTRODUODENOSCOPY (EGD) WITH PROPOFOL N/A 10/28/2019   Procedure: ESOPHAGOGASTRODUODENOSCOPY (EGD) WITH PROPOFOL;  Surgeon: Regis Bill, MD;  Location: [facility] ENDOSCOPY;  Service: Endoscopy;  Laterality: N/A;   ESOPHAGOGASTRODUODENOSCOPY (EGD) WITH PROPOFOL N/A 12/02/2019   Procedure: ESOPHAGOGASTRODUODENOSCOPY (EGD) WITH PROPOFOL;  Surgeon: Regis Bill, MD;  Location: [facility] ENDOSCOPY;  Service: Endoscopy;  Laterality: N/A;   ESOPHAGOGASTRODUODENOSCOPY (EGD) WITH PROPOFOL N/A 02/18/2020   Procedure: ESOPHAGOGASTRODUODENOSCOPY (EGD) WITH PROPOFOL;  Surgeon: Regis Bill, MD;  Location: [facility] ENDOSCOPY;  Service: Endoscopy;  Laterality: N/A;   ESOPHAGOGASTRODUODENOSCOPY (EGD) WITH PROPOFOL N/A 08/02/2021   Procedure: ESOPHAGOGASTRODUODENOSCOPY (EGD) WITH PROPOFOL;  Surgeon: Regis Bill, MD;  Location: [facility] ENDOSCOPY;  Service: Endoscopy;  Laterality:  N/A;  REQUEST AM   ESOPHAGOGASTRODUODENOSCOPY (EGD) WITH PROPOFOL N/A 10/05/2021   Procedure: ESOPHAGOGASTRODUODENOSCOPY (EGD) WITH PROPOFOL;  Surgeon: Regis Bill, MD;  Location: [facility] ENDOSCOPY;  Service: Endoscopy;  Laterality: N/A;   FRACTURE SURGERY Right 05/28/2019   distal radius fracture   HERNIA REPAIR     left inguinal hernia repair as  an infant   KYPHOPLASTY N/A 02/11/2016   Procedure: KYPHOPLASTY  L2,T9;  Surgeon: Kennedy Bucker, MD;  Location: [facility] ORS;  Service: Orthopedics;  Laterality: N/A;   KYPHOPLASTY N/A 03/15/2016   Procedure: KYPHOPLASTY L3;  Surgeon: Kennedy Bucker, MD;  Location: [facility] ORS;  Service: Orthopedics;  Laterality: N/A;   OPEN REDUCTION INTERNAL FIXATION (ORIF) DISTAL RADIAL FRACTURE Right 05/28/2019   Procedure: OPEN REDUCTION INTERNAL FIXATION (ORIF) DISTAL RADIAL FRACTURE;  Surgeon: Kennedy Bucker, MD;  Location: [facility] ORS;  Service: Orthopedics;  Laterality: Right;   skin cancer removed     TONSILLECTOMY       Subjective Assessment -      Subjective Pt reports no significant changes since last session and is doing well overall.    Pertinent History Charles Stanton is a 66yoM who was prescribed with Parkinson's Disease ~ 10 years.  Pt is followed by neurology, reports good reponse to medications which help control his tremors somewhat. Pt uses a SPC for AMB, also uses a walker for longer distance walking. Pt goes into community almost daily (they like to eat out) which includes lots of SPC [place] eand navigation of 4 stairs at home.    Currently in Pain? No/denies             TREATMENT DATE: 05/11/2022  BP at start 149/71 with HR 99  Exercise/Activity Sets/Reps/Time/ Resistance Assistance Charge type Comments  Octane fitness machine ( seated elyptical Level 2 x 6 min  Rest break x 2 min at minute 3 secondary to fatigue   TE For aerobic priming and instruction in future gym based exercise program  Tilt 3 height 4 with UE and LE reciprocal movement   Seated PWR! Up X 10   TA For carrover to STS activity later in session, working on forward weight shift for standing.   Standing hip flexor stretch on steps   X 45 sec ea LE   TE To improve available ROM .   Ambualtion with AW and SPC 2.5# AW 2 x 320 ft  CGA TA Cues for sequencing cane with steps.   Rows GTB 2 x 12   TE   LAQ X 15 2.5# AW   TE    Gait training  with SPC and weaving around obstacles 2 sets of 4 rounds through  TA Cues for proper sequencing of cane as well as cues for proper cane placement to allow for optimal lower extremity movement patterns  STS with PWR! Up arm movement  2 x 10  CGA TA Continues to require cues for proper form with this activity  PWR! Step standing  X 10 ea  CGA TA   Treatment Provided this session    Pt educated throughout session about proper posture and technique with exercises. Improved exercise technique, movement at target joints, use of target muscles after min to mod verbal, visual, tactile cues. Note: Portions of this document were prepared using Dragon voice recognition software and although reviewed may contain unintentional dictation errors in syntax, grammar, or spelling.          PT Education -     Education Details Pt  educated regarding benefits of virtual urgent care and how to access virtual urgent care.      Person(s) Educated Patient    Methods Explanation;Demonstration ; handout   Comprehension Verbalized understanding;Returned demonstration;Need further instruction              PT Short Term Goals -       PT SHORT TERM GOAL #1   Title Pt to report regular performance of exercises prescribed for home and a sense of improvements in strength and balance AEB them not being as challenging anymore.    Baseline Will issue on visit 2    Time 4    Period Weeks    Status met   Target Date 03/15/22      PT SHORT TERM GOAL #2   Title Pt to demonstrate improved power AEB improved 5xSTS hands free in <18sec.    Baseline 03/01/22: 22sec hands free, author provideing foot block bilat 03/31/22: 14.82sec 4/23:17.3 sec   Time 4    Period Weeks    Status Met    Target Date 03/29/22               PT Long Term Goals - Target goal date for all remaining long term goals is 07/19/2022        PT LONG TERM GOAL #1   Title Pt to improve score on FOTO survey to 57 to indicate reduced  difficulty with basic mobility required for ADL performance.    Baseline 03/01/22:49 04/29/22:56%   Time 8    Period Weeks    Status ONGOING          PT LONG TERM GOAL #2   Title Pt to improve tolerance to overground AMB c SPC AEB performance >851ft.    Baseline 2/27: fatigued after 456ft lap performed in four minutes ten seconds. 03/31/22: 764ft with SPC no rest break 04/26/22: 794 ft    Time 8    Period Weeks    Status Progressing/ ONGOING         PT LONG TERM GOAL #3   Title Pt to demonstrate improved leg power AEB 5xSTS from chair <14sec hands free and without need for minGuard assist for safety and without need for foot block.    Baseline 03/01/22: 22sec hands free, feet blocked, minGuard assist  03/31/22: 14.82sec  4/23:17.3 sec   Time 8     Period Weeks    Status ONGOING          PT LONG TERM GOAL #4   Title Pt to demonstrate improvement in balance performance AEB >6 improvement BERG Balance for balance assessment.    Baseline 41 on 03/03/22. 3/38/24: 42 4/23: 45   Time 8    Period Weeks    Status PROGRESSING / ONGOING          5.  Pt will improve distance with LRAD to 900 feet or greater in order to indicate further progress to community ambulation distances Baseline: 794 ft with SPC  Goal status: INITIAL    Plan     Clinical Impression Statement Patient presents with good motivation for completion of physical therapy activities. Patient continues to progress with lower extremity strengthening, endurance, and Parkinson's disease specific exercises and activities.Pt will continue to benefit from skilled physical therapy intervention to address impairments, improve QOL, and attain therapy goals.      Personal Factors and Comorbidities Time since onset of injury/illness/exacerbation;Past/Current Experience    Examination-Activity Limitations Locomotion Level;Transfers;Bed Mobility;Reach Overhead;Hygiene/Grooming;Dressing;Toileting;Stairs;[employer]  Stability/Clinical  Decision Making Evolving/Moderate complexity    Clinical Decision Making Moderate    Rehab Potential Good    PT Frequency 2x / week    PT Duration 8 weeks    PT Treatment/Interventions ADLs/Self Care Home Management;Moist Heat;Gait training;Stair training;Functional mobility training;Therapeutic activities;Therapeutic exercise;Balance training;Neuromuscular re-education;Patient/family education    PT Next Visit Plan .  STS PWR! Up with eventual addition of PWR! Step. Work on weight shift cues with STS. Dynamic balance/gait training as indicated.    PT Home Exercise Plan 03/01/22: discussed finding and elevated sit surface for future STS exercises (something ~18-19 inches high)    Consulted and Agree with Plan of Care Patient             Patient will benefit from skilled therapeutic intervention in order to improve the following deficits and impairments:  balance, strength, coordination, gait, transfers. Posture. ROM   Visit Diagnosis: Unsteadiness on feet  Difficulty in walking, not elsewhere classified  Other abnormalities of gait and mobility  Muscle weakness (generalized)    Problem List Patient Active Problem List   Diagnosis Date Noted   Acute respiratory failure due to COVID-19 (HCC) 05/28/2019   Alcohol dependence with uncomplicated withdrawal (HCC) 01/23/2018   Alcohol withdrawal (HCC) 01/23/2018   GIB (gastrointestinal bleeding) 12/09/2017   A-fib (HCC) 12/09/2017   Esophageal obstruction 10/10/2016   Esophageal ulceration 10/10/2016   GI bleed 09/20/2016   Low vitamin B12 level 03/18/2016   S/P kyphoplasty 03/18/2016   Basal cell carcinoma 02/08/2016   Atrial fibrillation (HCC) 01/27/2016   Colitis 01/27/2016   HLD (hyperlipidemia) 01/27/2016   Anxiety 01/27/2016   Hypokalemia 01/27/2016   Major depressive disorder, recurrent episode, moderate (HCC) 04/14/2015   Anxiety, generalized 12/06/2013   Parkinson disease 08/07/2013   Dysphagia 05/12/2013    Esophageal mass 05/12/2013   GERD (gastroesophageal reflux disease) 05/12/2013   Vomiting 05/12/2013   Open bite of lower leg 05/10/2013   Dog bite of lower leg 05/10/2013   Abnormal finding on liver function 03/25/2013   Benign neoplasm 03/25/2013   Dizziness 03/25/2013   Type 2 diabetes mellitus (HCC) 03/25/2013   BP (high blood pressure) 03/25/2013   Awareness of heartbeats 03/25/2013   Pure hypercholesterolemia 03/25/2013   Infective tonsillitis 03/25/2013   Adenomatous polyp 03/25/2013      Norman Herrlich, PT 05/26/2022, 5:07 PM  [facility] at [facility] [address] [place], [place], [address] Phone: [phone]   Fax:  [phone]

## 2022-05-26 NOTE — Therapy (Signed)
OUTPATIENT OCCUPATIONAL THERAPY NEURO EVALUATION  Patient Name: Charles Stanton MRN: [id number] DOB:26-Sep-1955, 67 y.o., male Today's Date: 05/29/2022  PCP: Dr. Barbette Reichmann REFERRING PROVIDER: Dr. Cristopher Peru  END OF SESSION:  OT End of Session - 05/29/22 1922     Visit Number 1    Number of Visits 24    Date for OT Re-Evaluation 08/18/22    Progress Note Due on Visit 10    OT Start Time 1300    OT Stop Time 1345    OT Time Calculation (min) 45 min    Equipment Utilized During Treatment RW    Activity Tolerance Patient tolerated treatment well    Behavior During Therapy [facility] for tasks assessed/performed            Past Medical History:  Diagnosis Date   Abnormal liver function    Anemia    Anxiety    Atrial fibrillation (HCC)    Cancer (HCC) 2017   skin   Colonic mass    Depression    Dizziness    Dry cough    History of colon polyps    Hypercholesteremia    Hyperlipemia    Hypertension    Palpitations    Parkinson's disease    Parkinson's disease    PVD (peripheral vascular disease) (HCC)    Tonsillitis 03/25/2013   Tremors of nervous system    Past Surgical History:  Procedure Laterality Date   BACK SURGERY     Kyphoplasty   March 2018   COLONOSCOPY     COLONOSCOPY WITH PROPOFOL N/A 12/12/2017   Procedure: COLONOSCOPY WITH PROPOFOL;  Surgeon: Wyline Mood, MD;  Location: [facility] ENDOSCOPY;  Service: Gastroenterology;  Laterality: N/A;   ESOPHAGOGASTRODUODENOSCOPY N/A 12/11/2017   Procedure: ESOPHAGOGASTRODUODENOSCOPY (EGD);  Surgeon: Wyline Mood, MD;  Location: [facility] ENDOSCOPY;  Service: Gastroenterology;  Laterality: N/A;   ESOPHAGOGASTRODUODENOSCOPY N/A 10/08/2021   Procedure: ESOPHAGOGASTRODUODENOSCOPY (EGD);  Surgeon: Regis Bill, MD;  Location: [facility] ENDOSCOPY;  Service: Endoscopy;  Laterality: N/A;   ESOPHAGOGASTRODUODENOSCOPY (EGD) WITH PROPOFOL N/A 09/21/2016   Procedure: ESOPHAGOGASTRODUODENOSCOPY (EGD) WITH PROPOFOL;  Surgeon: Wyline Mood,  MD;  Location: [facility] ENDOSCOPY;  Service: Gastroenterology;  Laterality: N/A;   ESOPHAGOGASTRODUODENOSCOPY (EGD) WITH PROPOFOL N/A 11/07/2016   Procedure: ESOPHAGOGASTRODUODENOSCOPY (EGD) WITH PROPOFOL;  Surgeon: Wyline Mood, MD;  Location: [facility] ENDOSCOPY;  Service: Gastroenterology;  Laterality: N/A;   ESOPHAGOGASTRODUODENOSCOPY (EGD) WITH PROPOFOL N/A 10/15/2019   Procedure: ESOPHAGOGASTRODUODENOSCOPY (EGD) WITH PROPOFOL;  Surgeon: Regis Bill, MD;  Location: [facility] ENDOSCOPY;  Service: Endoscopy;  Laterality: N/A;   ESOPHAGOGASTRODUODENOSCOPY (EGD) WITH PROPOFOL N/A 10/28/2019   Procedure: ESOPHAGOGASTRODUODENOSCOPY (EGD) WITH PROPOFOL;  Surgeon: Regis Bill, MD;  Location: [facility] ENDOSCOPY;  Service: Endoscopy;  Laterality: N/A;   ESOPHAGOGASTRODUODENOSCOPY (EGD) WITH PROPOFOL N/A 12/02/2019   Procedure: ESOPHAGOGASTRODUODENOSCOPY (EGD) WITH PROPOFOL;  Surgeon: Regis Bill, MD;  Location: [facility] ENDOSCOPY;  Service: Endoscopy;  Laterality: N/A;   ESOPHAGOGASTRODUODENOSCOPY (EGD) WITH PROPOFOL N/A 02/18/2020   Procedure: ESOPHAGOGASTRODUODENOSCOPY (EGD) WITH PROPOFOL;  Surgeon: Regis Bill, MD;  Location: [facility] ENDOSCOPY;  Service: Endoscopy;  Laterality: N/A;   ESOPHAGOGASTRODUODENOSCOPY (EGD) WITH PROPOFOL N/A 08/02/2021   Procedure: ESOPHAGOGASTRODUODENOSCOPY (EGD) WITH PROPOFOL;  Surgeon: Regis Bill, MD;  Location: [facility] ENDOSCOPY;  Service: Endoscopy;  Laterality: N/A;  REQUEST AM   ESOPHAGOGASTRODUODENOSCOPY (EGD) WITH PROPOFOL N/A 10/05/2021   Procedure: ESOPHAGOGASTRODUODENOSCOPY (EGD) WITH PROPOFOL;  Surgeon: Regis Bill, MD;  Location: [facility] ENDOSCOPY;  Service: Endoscopy;  Laterality: N/A;   FRACTURE SURGERY Right 05/28/2019  distal radius fracture   HERNIA REPAIR     left inguinal hernia repair as an infant   KYPHOPLASTY N/A 02/11/2016   Procedure: KYPHOPLASTY  L2,T9;  Surgeon: Kennedy Bucker, MD;  Location: [facility] ORS;  Service: Orthopedics;   Laterality: N/A;   KYPHOPLASTY N/A 03/15/2016   Procedure: KYPHOPLASTY L3;  Surgeon: Kennedy Bucker, MD;  Location: [facility] ORS;  Service: Orthopedics;  Laterality: N/A;   OPEN REDUCTION INTERNAL FIXATION (ORIF) DISTAL RADIAL FRACTURE Right 05/28/2019   Procedure: OPEN REDUCTION INTERNAL FIXATION (ORIF) DISTAL RADIAL FRACTURE;  Surgeon: Kennedy Bucker, MD;  Location: [facility] ORS;  Service: Orthopedics;  Laterality: Right;   skin cancer removed     TONSILLECTOMY     Patient Active Problem List   Diagnosis Date Noted   Acute respiratory failure due to COVID-19 (HCC) 05/28/2019   Alcohol dependence with uncomplicated withdrawal (HCC) 01/23/2018   Alcohol withdrawal (HCC) 01/23/2018   GIB (gastrointestinal bleeding) 12/09/2017   A-fib (HCC) 12/09/2017   Esophageal obstruction 10/10/2016   Esophageal ulceration 10/10/2016   GI bleed 09/20/2016   Low vitamin B12 level 03/18/2016   S/P kyphoplasty 03/18/2016   Basal cell carcinoma 02/08/2016   Atrial fibrillation (HCC) 01/27/2016   Colitis 01/27/2016   HLD (hyperlipidemia) 01/27/2016   Anxiety 01/27/2016   Hypokalemia 01/27/2016   Major depressive disorder, recurrent episode, moderate (HCC) 04/14/2015   Anxiety, generalized 12/06/2013   Parkinson disease 08/07/2013   Dysphagia 05/12/2013   Esophageal mass 05/12/2013   GERD (gastroesophageal reflux disease) 05/12/2013   Vomiting 05/12/2013   Open bite of lower leg 05/10/2013   Dog bite of lower leg 05/10/2013   Abnormal finding on liver function 03/25/2013   Benign neoplasm 03/25/2013   Dizziness 03/25/2013   Type 2 diabetes mellitus (HCC) 03/25/2013   BP (high blood pressure) 03/25/2013   Awareness of heartbeats 03/25/2013   Pure hypercholesterolemia 03/25/2013   Infective tonsillitis 03/25/2013   Adenomatous polyp 03/25/2013   ONSET DATE: 10 years   REFERRING DIAG: Parkinson's Disease  THERAPY DIAG:  Muscle weakness (generalized)  Other lack of coordination  Rationale for  Evaluation and Treatment: Rehabilitation  SUBJECTIVE:  SUBJECTIVE STATEMENT: Pt reports best time with tremors is in the morning, worse in the evening when tired. Pt accompanied by: self; spouse present in lobby, but did not follow for eval  PERTINENT HISTORY: Pt has been working with outpatient PT here at [facility] since end of Feb 2024.  OT ordered d/t worsening PD symptoms contributing to functional decline with ADLs.   PRECAUTIONS: Fall  WEIGHT BEARING RESTRICTIONS: No  PAIN:  Are you having pain? No  FALLS: Has patient fallen in last 6 months? Yes. Number of falls 1  LIVING ENVIRONMENT: Lives with: lives with their spouse Lives in: split level but pt remains on main level to avoid steps Stairs: Yes: Internal: 2 steps; can reach both Has following equipment at home: Quad cane small base, Walker - 4 wheeled, bed side commode, Grab bars, and pull down seat for shower, hand held shower hose,   PLOF: Needs assistance with ADLs; pt reports that spouse assists but is consistent to always let pt try things first before she automatically helps.    PATIENT GOALS: To be more indep  OBJECTIVE:  HAND DOMINANCE: Right (most affected side from PD)  ADLs: Overall ADLs: spouse is primary caregiver Transfers/ambulation related to ADLs: uses RW Eating: spouse cuts food; pt reports having to use L non-dominant hand to eat for the last 5-6 yrs Grooming: set  up; assist to squeeze toothpaste and denture paste; uses L non-dominant hand UB Dressing: set up; assist with clothing fasteners LB Dressing: Min A; assist with socks, dons slip on shoes with set up (has tried a sockaid but was unsuccessful) Toileting: modified indep Bathing: Set up/min A; spouse squeezes shampoo into pt's hands, spouse helps to towel dry  Tub Shower transfers: distant supv Equipment:  see above   IADLs: Shopping: pt can accompany spouse by pushing a shopping cart.  Light housekeeping: pt can drag trash to bin and can roll  the bin to the road and back Meal Prep: spouse manages  Community mobility: spouse drives; uses rollator for longer distances  Medication management: spouse manages pills using weekly pill organizer.   Financial management: spouse manages  Handwriting: Mild micrographia and <25% legible  MOBILITY STATUS: Hx of falls  POSTURE COMMENTS:  rounded shoulders and forward head, R shoulder elevated  ACTIVITY TOLERANCE: Activity tolerance: TBD and assessed further with functional activities   FUNCTIONAL OUTCOME MEASURES: FOTO: TBD   UPPER EXTREMITY ROM:    Active ROM Right eval Left eval  Shoulder flexion 118 85  Shoulder abduction 128 124  Shoulder adduction    Shoulder extension    Shoulder internal rotation [facility] WFL  Shoulder external rotation [facility] WFL  Elbow flexion    Elbow extension    Wrist flexion 55 76  Wrist extension 57 75  Wrist ulnar deviation    Wrist radial deviation    Wrist pronation [facility] WFL  Wrist supination 63 75  (Blank rows = not tested)  UPPER EXTREMITY MMT:     MMT Right eval Left eval  Shoulder flexion 4 4  Shoulder abduction 4+ 4+  Shoulder adduction    Shoulder extension    Shoulder internal rotation    Shoulder external rotation    Middle trapezius    Lower trapezius    Elbow flexion 4+ 4+  Elbow extension 4+ 4+  Wrist flexion 4+ 4+  Wrist extension 4+ 4+  Wrist ulnar deviation    Wrist radial deviation    Wrist pronation    Wrist supination    (Blank rows = not tested)  HAND FUNCTION: Grip strength: Right: 16 lbs; Left: 38 lbs, Lateral pinch: Right: 10 lbs, Left: 14 lbs, and 3 point pinch: Right: 9 lbs, Left: 11 lbs  COORDINATION: 9 Hole Peg test: Right: 38 sec; Left: 40 sec  SENSATION: WFL  EDEMA: None  MUSCLE TONE: R/L normal  COGNITION: Overall cognitive status: Within functional limits for tasks assessed  VISION: Subjective report: wears glasses all the time    PRAXIS: Impaired: Motor planning  OBSERVATIONS:  R  SF PIP flexion contracture 105* from 5-6 years ago from a fall  TODAY'S TREATMENT:                                                                                                                              DATE: 05/25/22  Evaluation completed.  PATIENT EDUCATION: Education details: OT role, goals, poc Person educated: Patient Education method: Explanation and Verbal cues Education comprehension: verbalized understanding  HOME EXERCISE PROGRAM: To be initiated within the next 1-2 sessions  GOALS: Goals reviewed with patient? Yes  SHORT TERM GOALS: Target date: 07/07/22 (6 weeks)  Pt will be indep to perform HEP for improving bilat hand strength and coordination skills. Baseline: Eval: not yet initiated Goal status: INITIAL  LONG TERM GOALS: Target date: 08/18/22 (12 weeks)  Pt will increase FOTO score to (TBD) or better to indicate improvement in self perceived functional use of the R arm with daily tasks. Baseline: Eval: TBD Goal status: INITIAL  2.  Pt will increase R grip strength by 5 or more lbs to more easily hold and stabilize ADL supplies in the R dominant hand. Baseline: R grip 16 lbs; limited from PD but also by 5th digit PIP flexion contracture, as this digit can not engage in gripping (L 38 lbs) Goal status: INITIAL  3.  Pt will improve R hand FMC/dexterity skills to be able to sign his name on medical/legal documents with 75% legibility, using adapted writing aids as needed. Baseline: Eval: <25% legible and pt verbalizes embarrassment when attempting to sign his name on forms Goal status: INITIAL  4.  Pt will increase R FMC/GMC skills to engage the RUE into UB ADLs at least 50% of the time. Baseline: Eval: Pt uses the L non-dominant arm for self care tasks. Goal status: INITIAL  ASSESSMENT:  CLINICAL IMPRESSION: Patient is a 67 y.o. male who was seen today for occupational therapy evaluation for functional decline related to Parkinson's Disease.  Pt presents  with BUE tremors, decreased bilat hand strength, decreased FMC/GMC skills, limited bilat shoulder flexibility, all of the above more pronounced on the R dominant arm.  Pt resides with supportive spouse, but pt's goal is to be as independent as possible.  Pt will benefit from skilled OT to address above noted deficits, specifically working to increase engagement of the RUE during self care tasks.    PERFORMANCE DEFICITS: in functional skills including ADLs, IADLs, coordination, dexterity, ROM, strength, flexibility, Fine motor control, Gross motor control, mobility, balance, body mechanics, endurance, decreased knowledge of use of DME, and UE functional use, cognitive skills including emotional and memory, and psychosocial skills including coping strategies, environmental adaptation, habits, and routines and behaviors.   IMPAIRMENTS: are limiting patient from ADLs, IADLs, and leisure.   CO-MORBIDITIES: has co-morbidities such as anxiety, depression  that affects occupational performance. Patient will benefit from skilled OT to address above impairments and improve overall function.  MODIFICATION OR ASSISTANCE TO COMPLETE EVALUATION: No modification of tasks or assist necessary to complete an evaluation.  OT OCCUPATIONAL PROFILE AND HISTORY: Problem focused assessment: Including review of records relating to presenting problem.  CLINICAL DECISION MAKING: Moderate - several treatment options, min-mod task modification necessary  REHAB POTENTIAL: Good for goals  EVALUATION COMPLEXITY: Moderate    PLAN:  OT FREQUENCY: 2x/week  OT DURATION: 12 weeks  PLANNED INTERVENTIONS: self care/ADL training, therapeutic exercise, therapeutic activity, neuromuscular re-education, manual therapy, passive range of motion, balance training, functional mobility training, moist heat, cryotherapy, patient/family education, cognitive remediation/compensation, psychosocial skills training, energy conservation, coping  strategies training, and DME and/or AE instructions  RECOMMENDED OTHER SERVICES: None at this time  CONSULTED AND AGREED WITH PLAN OF CARE: Patient  PLAN FOR NEXT SESSION: initiate HEP, complete FOTO  Danelle Earthly, MS, OTR/L  [employer] Dials, OT 05/29/2022,  7:23 PM

## 2022-05-31 ENCOUNTER — Encounter: Payer: Self-pay | Admitting: Physical Therapy

## 2022-05-31 ENCOUNTER — Ambulatory Visit: Payer: Medicare Other | Admitting: Physical Therapy

## 2022-05-31 DIAGNOSIS — M6281 Muscle weakness (generalized): Secondary | ICD-10-CM

## 2022-05-31 DIAGNOSIS — R2681 Unsteadiness on feet: Secondary | ICD-10-CM

## 2022-05-31 DIAGNOSIS — R262 Difficulty in walking, not elsewhere classified: Secondary | ICD-10-CM

## 2022-05-31 DIAGNOSIS — R2689 Other abnormalities of gait and mobility: Secondary | ICD-10-CM

## 2022-05-31 NOTE — Therapy (Signed)
Outpatient Physical Therapy Treatment    Patient Details  Name: Charles Stanton MRN: [id number] Date of Birth: 01-18-1955 Referring Provider (PT): Dr. Cristopher Peru   Encounter Date: 05/31/2022  END OF SESSION:    PT End of Session - 05/31/22 1215     Visit Number 25    Number of Visits 40    Date for PT Re-Evaluation 07/19/22    Authorization Type Medicare A & B primary; federal employee secondary    Authorization Time Period -07/19/22    Progress Note Due on Visit 30    PT Start Time 1145    PT Stop Time 1228    PT Time Calculation (min) 43 min    Equipment Utilized During Treatment Gait belt    Activity Tolerance Patient tolerated treatment well;No increased pain    Behavior During Therapy [facility] for tasks assessed/performed                          Past Medical History:  Diagnosis Date   Abnormal liver function    Anemia    Anxiety    Atrial fibrillation (HCC)    Cancer (HCC) 2017   skin   Colonic mass    Depression    Dizziness    Dry cough    History of colon polyps    Hypercholesteremia    Hyperlipemia    Hypertension    Palpitations    Parkinson's disease    Parkinson's disease    PVD (peripheral vascular disease) (HCC)    Tonsillitis 03/25/2013   Tremors of nervous system     Past Surgical History:  Procedure Laterality Date   BACK SURGERY     Kyphoplasty   March 2018   COLONOSCOPY     COLONOSCOPY WITH PROPOFOL N/A 12/12/2017   Procedure: COLONOSCOPY WITH PROPOFOL;  Surgeon: Wyline Mood, MD;  Location: [facility] ENDOSCOPY;  Service: Gastroenterology;  Laterality: N/A;   ESOPHAGOGASTRODUODENOSCOPY N/A 12/11/2017   Procedure: ESOPHAGOGASTRODUODENOSCOPY (EGD);  Surgeon: Wyline Mood, MD;  Location: [facility] ENDOSCOPY;  Service: Gastroenterology;  Laterality: N/A;   ESOPHAGOGASTRODUODENOSCOPY N/A 10/08/2021   Procedure: ESOPHAGOGASTRODUODENOSCOPY (EGD);  Surgeon: Regis Bill, MD;  Location: [facility] ENDOSCOPY;  Service: Endoscopy;  Laterality:  N/A;   ESOPHAGOGASTRODUODENOSCOPY (EGD) WITH PROPOFOL N/A 09/21/2016   Procedure: ESOPHAGOGASTRODUODENOSCOPY (EGD) WITH PROPOFOL;  Surgeon: Wyline Mood, MD;  Location: [facility] ENDOSCOPY;  Service: Gastroenterology;  Laterality: N/A;   ESOPHAGOGASTRODUODENOSCOPY (EGD) WITH PROPOFOL N/A 11/07/2016   Procedure: ESOPHAGOGASTRODUODENOSCOPY (EGD) WITH PROPOFOL;  Surgeon: Wyline Mood, MD;  Location: [facility] ENDOSCOPY;  Service: Gastroenterology;  Laterality: N/A;   ESOPHAGOGASTRODUODENOSCOPY (EGD) WITH PROPOFOL N/A 10/15/2019   Procedure: ESOPHAGOGASTRODUODENOSCOPY (EGD) WITH PROPOFOL;  Surgeon: Regis Bill, MD;  Location: [facility] ENDOSCOPY;  Service: Endoscopy;  Laterality: N/A;   ESOPHAGOGASTRODUODENOSCOPY (EGD) WITH PROPOFOL N/A 10/28/2019   Procedure: ESOPHAGOGASTRODUODENOSCOPY (EGD) WITH PROPOFOL;  Surgeon: Regis Bill, MD;  Location: [facility] ENDOSCOPY;  Service: Endoscopy;  Laterality: N/A;   ESOPHAGOGASTRODUODENOSCOPY (EGD) WITH PROPOFOL N/A 12/02/2019   Procedure: ESOPHAGOGASTRODUODENOSCOPY (EGD) WITH PROPOFOL;  Surgeon: Regis Bill, MD;  Location: [facility] ENDOSCOPY;  Service: Endoscopy;  Laterality: N/A;   ESOPHAGOGASTRODUODENOSCOPY (EGD) WITH PROPOFOL N/A 02/18/2020   Procedure: ESOPHAGOGASTRODUODENOSCOPY (EGD) WITH PROPOFOL;  Surgeon: Regis Bill, MD;  Location: [facility] ENDOSCOPY;  Service: Endoscopy;  Laterality: N/A;   ESOPHAGOGASTRODUODENOSCOPY (EGD) WITH PROPOFOL N/A 08/02/2021   Procedure: ESOPHAGOGASTRODUODENOSCOPY (EGD) WITH PROPOFOL;  Surgeon: Regis Bill, MD;  Location: [facility] ENDOSCOPY;  Service: Endoscopy;  Laterality: N/A;  REQUEST AM   ESOPHAGOGASTRODUODENOSCOPY (EGD) WITH PROPOFOL N/A 10/05/2021   Procedure: ESOPHAGOGASTRODUODENOSCOPY (EGD) WITH PROPOFOL;  Surgeon: Regis Bill, MD;  Location: [facility] ENDOSCOPY;  Service: Endoscopy;  Laterality: N/A;   FRACTURE SURGERY Right 05/28/2019   distal radius fracture   HERNIA REPAIR     left inguinal hernia repair as  an infant   KYPHOPLASTY N/A 02/11/2016   Procedure: KYPHOPLASTY  L2,T9;  Surgeon: Kennedy Bucker, MD;  Location: [facility] ORS;  Service: Orthopedics;  Laterality: N/A;   KYPHOPLASTY N/A 03/15/2016   Procedure: KYPHOPLASTY L3;  Surgeon: Kennedy Bucker, MD;  Location: [facility] ORS;  Service: Orthopedics;  Laterality: N/A;   OPEN REDUCTION INTERNAL FIXATION (ORIF) DISTAL RADIAL FRACTURE Right 05/28/2019   Procedure: OPEN REDUCTION INTERNAL FIXATION (ORIF) DISTAL RADIAL FRACTURE;  Surgeon: Kennedy Bucker, MD;  Location: [facility] ORS;  Service: Orthopedics;  Laterality: Right;   skin cancer removed     TONSILLECTOMY       Subjective Assessment -      Subjective Pt reports no significant changes since last session and is doing well overall.    Pertinent History Charles Stanton is a 66yoM who was prescribed with Parkinson's Disease ~ 10 years.  Pt is followed by neurology, reports good reponse to medications which help control his tremors somewhat. Pt uses a SPC for AMB, also uses a walker for longer distance walking. Pt goes into community almost daily (they like to eat out) which includes lots of SPC [place] eand navigation of 4 stairs at home.    Currently in Pain? No/denies             TREATMENT   Exercise/Activity Sets/Reps/Time/ Resistance Assistance Charge type Comments  Octane fitness machine ( seated elyptical Level 2 x 6 min  Rest break x 2 min at minute 3 secondary to fatigue   TE For aerobic priming and instruction in future gym based exercise program  Tilt 3 height 4 with UE and LE reciprocal movement   Seated PWR! Moves ( up, rock, twist, step)  X 10 ea   NMR   Standing hip flexor stretch on steps   X 45 sec ea LE   TE To improve available ROM .   Ambualtion with AW and SPC 2.5# AW 2 x 320 ft  CGA NMR Cues for sequencing cane with steps.  Used metronome app on phone for 50 BPM for optimization of cane sequencing with gait  Rows GTB 2 x 12   TE   LAQ X 15 2.5# AW   TE    STS with PWR! And then transition  to [facility]! Step  X 10 9 5  to ea side)  CGA NMR Continues to require cues for proper form with this activity  Treatment Provided this session    Pt educated throughout session about proper posture and technique with exercises. Improved exercise technique, movement at target joints, use of target muscles after min to mod verbal, visual, tactile cues. Note: Portions of this document were prepared using Dragon voice recognition software and although reviewed may contain unintentional dictation errors in syntax, grammar, or spelling.          PT Education -     Education Details Pt educated regarding benefits of virtual urgent care and how to access virtual urgent care.      Person(s) Educated Patient    Methods Explanation;Demonstration ; handout   Comprehension Verbalized understanding;Returned demonstration;Need further instruction  PT Short Term Goals -       PT SHORT TERM GOAL #1   Title Pt to report regular performance of exercises prescribed for home and a sense of improvements in strength and balance AEB them not being as challenging anymore.    Baseline Will issue on visit 2    Time 4    Period Weeks    Status met   Target Date 03/15/22      PT SHORT TERM GOAL #2   Title Pt to demonstrate improved power AEB improved 5xSTS hands free in <18sec.    Baseline 03/01/22: 22sec hands free, author provideing foot block bilat 03/31/22: 14.82sec 4/23:17.3 sec   Time 4    Period Weeks    Status Met    Target Date 03/29/22               PT Long Term Goals - Target goal date for all remaining long term goals is 07/19/2022        PT LONG TERM GOAL #1   Title Pt to improve score on FOTO survey to 57 to indicate reduced difficulty with basic mobility required for ADL performance.    Baseline 03/01/22:49 04/29/22:56%   Time 8    Period Weeks    Status ONGOING          PT LONG TERM GOAL #2   Title Pt to improve tolerance to overground AMB c SPC AEB  performance >85ft.    Baseline 2/27: fatigued after 469ft lap performed in four minutes ten seconds. 03/31/22: 761ft with SPC no rest break 04/26/22: 794 ft    Time 8    Period Weeks    Status Progressing/ ONGOING         PT LONG TERM GOAL #3   Title Pt to demonstrate improved leg power AEB 5xSTS from chair <14sec hands free and without need for minGuard assist for safety and without need for foot block.    Baseline 03/01/22: 22sec hands free, feet blocked, minGuard assist  03/31/22: 14.82sec  4/23:17.3 sec   Time 8     Period Weeks    Status ONGOING          PT LONG TERM GOAL #4   Title Pt to demonstrate improvement in balance performance AEB >6 improvement BERG Balance for balance assessment.    Baseline 41 on 03/03/22. 3/38/24: 42 4/23: 45   Time 8    Period Weeks    Status PROGRESSING / ONGOING          5.  Pt will improve distance with LRAD to 900 feet or greater in order to indicate further progress to community ambulation distances Baseline: 794 ft with SPC  Goal status: INITIAL    Plan     Clinical Impression Statement Patient presents with good motivation for completion of physical therapy activities. Patient continues to progress with lower extremity strengthening, endurance, and Parkinson's disease specific exercises and activities.Pt will continue to benefit from skilled physical therapy intervention to address impairments, improve QOL, and attain therapy goals.      Personal Factors and Comorbidities Time since onset of injury/illness/exacerbation;Past/Current Experience    Examination-Activity Limitations Locomotion Level;Transfers;Bed Mobility;Reach Overhead;Hygiene/Grooming;Dressing;Toileting;Stairs;Bend    Stability/Clinical Decision Making Evolving/Moderate complexity    Clinical Decision Making Moderate    Rehab Potential Good    PT Frequency 2x / week    PT Duration 8 weeks    PT Treatment/Interventions ADLs/Self Care Home Management;Moist Heat;Gait  training;Stair training;Functional mobility  training;Therapeutic activities;Therapeutic exercise;Balance training;Neuromuscular re-education;Patient/family education    PT Next Visit Plan .  STS PWR! Up with eventual addition of PWR! Step. Work on weight shift cues with STS. Dynamic balance/gait training as indicated.    PT Home Exercise Plan 03/01/22: discussed finding and elevated sit surface for future STS exercises (something ~18-19 inches high)    Consulted and Agree with Plan of Care Patient             Patient will benefit from skilled therapeutic intervention in order to improve the following deficits and impairments:  balance, strength, coordination, gait, transfers. Posture. ROM   Visit Diagnosis: Unsteadiness on feet  Difficulty in walking, not elsewhere classified  Other abnormalities of gait and mobility  Muscle weakness (generalized)    Problem List Patient Active Problem List   Diagnosis Date Noted   Acute respiratory failure due to COVID-19 (HCC) 05/28/2019   Alcohol dependence with uncomplicated withdrawal (HCC) 01/23/2018   Alcohol withdrawal (HCC) 01/23/2018   GIB (gastrointestinal bleeding) 12/09/2017   A-fib (HCC) 12/09/2017   Esophageal obstruction 10/10/2016   Esophageal ulceration 10/10/2016   GI bleed 09/20/2016   Low vitamin B12 level 03/18/2016   S/P kyphoplasty 03/18/2016   Basal cell carcinoma 02/08/2016   Atrial fibrillation (HCC) 01/27/2016   Colitis 01/27/2016   HLD (hyperlipidemia) 01/27/2016   Anxiety 01/27/2016   Hypokalemia 01/27/2016   Major depressive disorder, recurrent episode, moderate (HCC) 04/14/2015   Anxiety, generalized 12/06/2013   Parkinson disease 08/07/2013   Dysphagia 05/12/2013   Esophageal mass 05/12/2013   GERD (gastroesophageal reflux disease) 05/12/2013   Vomiting 05/12/2013   Open bite of lower leg 05/10/2013   Dog bite of lower leg 05/10/2013   Abnormal finding on liver function 03/25/2013   Benign neoplasm  03/25/2013   Dizziness 03/25/2013   Type 2 diabetes mellitus (HCC) 03/25/2013   BP (high blood pressure) 03/25/2013   Awareness of heartbeats 03/25/2013   Pure hypercholesterolemia 03/25/2013   Infective tonsillitis 03/25/2013   Adenomatous polyp 03/25/2013      Norman Herrlich, PT 05/31/2022, 1:37 PM  [facility] [address] [place], [place], [address] Phone: [phone]   Fax:  [phone]

## 2022-06-02 ENCOUNTER — Ambulatory Visit: Payer: Medicare Other | Admitting: Physical Therapy

## 2022-06-02 ENCOUNTER — Encounter: Payer: Self-pay | Admitting: Physical Therapy

## 2022-06-02 ENCOUNTER — Ambulatory Visit: Payer: Medicare Other

## 2022-06-02 DIAGNOSIS — R2689 Other abnormalities of gait and mobility: Secondary | ICD-10-CM

## 2022-06-02 DIAGNOSIS — R278 Other lack of coordination: Secondary | ICD-10-CM

## 2022-06-02 DIAGNOSIS — M6281 Muscle weakness (generalized): Secondary | ICD-10-CM

## 2022-06-02 DIAGNOSIS — R2681 Unsteadiness on feet: Secondary | ICD-10-CM

## 2022-06-02 DIAGNOSIS — R262 Difficulty in walking, not elsewhere classified: Secondary | ICD-10-CM

## 2022-06-02 NOTE — Therapy (Signed)
OUTPATIENT OCCUPATIONAL THERAPY NEURO EVALUATION  Patient Name: Charles Stanton MRN: [id number] DOB:1955/10/24, 67 y.o., male Today's Date: 06/03/2022  PCP: Dr. Barbette Reichmann REFERRING PROVIDER: Dr. Cristopher Peru  END OF SESSION:  OT End of Session - 06/03/22 2120     Visit Number 2    Number of Visits 24    Date for OT Re-Evaluation 08/18/22    Progress Note Due on Visit 10    OT Start Time 1300    OT Stop Time 1345    OT Time Calculation (min) 45 min    Equipment Utilized During Treatment RW    Activity Tolerance Patient tolerated treatment well    Behavior During Therapy [name] for tasks assessed/performed            Past Medical History:  Diagnosis Date   Abnormal liver function    Anemia    Anxiety    Atrial fibrillation (HCC)    Cancer (HCC) 2017   skin   Colonic mass    Depression    Dizziness    Dry cough    History of colon polyps    Hypercholesteremia    Hyperlipemia    Hypertension    Palpitations    Parkinson's disease    Parkinson's disease    PVD (peripheral vascular disease) (HCC)    Tonsillitis 03/25/2013   Tremors of nervous system    Past Surgical History:  Procedure Laterality Date   BACK SURGERY     Kyphoplasty   March 2018   COLONOSCOPY     COLONOSCOPY WITH PROPOFOL N/A 12/12/2017   Procedure: COLONOSCOPY WITH PROPOFOL;  Surgeon: Wyline Mood, MD;  Location: [facility] ENDOSCOPY;  Service: Gastroenterology;  Laterality: N/A;   ESOPHAGOGASTRODUODENOSCOPY N/A 12/11/2017   Procedure: ESOPHAGOGASTRODUODENOSCOPY (EGD);  Surgeon: Wyline Mood, MD;  Location: [facility] ENDOSCOPY;  Service: Gastroenterology;  Laterality: N/A;   ESOPHAGOGASTRODUODENOSCOPY N/A 10/08/2021   Procedure: ESOPHAGOGASTRODUODENOSCOPY (EGD);  Surgeon: Regis Bill, MD;  Location: [facility] ENDOSCOPY;  Service: Endoscopy;  Laterality: N/A;   ESOPHAGOGASTRODUODENOSCOPY (EGD) WITH PROPOFOL N/A 09/21/2016   Procedure: ESOPHAGOGASTRODUODENOSCOPY (EGD) WITH PROPOFOL;  Surgeon: Wyline Mood,  MD;  Location: [facility] ENDOSCOPY;  Service: Gastroenterology;  Laterality: N/A;   ESOPHAGOGASTRODUODENOSCOPY (EGD) WITH PROPOFOL N/A 11/07/2016   Procedure: ESOPHAGOGASTRODUODENOSCOPY (EGD) WITH PROPOFOL;  Surgeon: Wyline Mood, MD;  Location: [facility] ENDOSCOPY;  Service: Gastroenterology;  Laterality: N/A;   ESOPHAGOGASTRODUODENOSCOPY (EGD) WITH PROPOFOL N/A 10/15/2019   Procedure: ESOPHAGOGASTRODUODENOSCOPY (EGD) WITH PROPOFOL;  Surgeon: Regis Bill, MD;  Location: [facility] ENDOSCOPY;  Service: Endoscopy;  Laterality: N/A;   ESOPHAGOGASTRODUODENOSCOPY (EGD) WITH PROPOFOL N/A 10/28/2019   Procedure: ESOPHAGOGASTRODUODENOSCOPY (EGD) WITH PROPOFOL;  Surgeon: Regis Bill, MD;  Location: [facility] ENDOSCOPY;  Service: Endoscopy;  Laterality: N/A;   ESOPHAGOGASTRODUODENOSCOPY (EGD) WITH PROPOFOL N/A 12/02/2019   Procedure: ESOPHAGOGASTRODUODENOSCOPY (EGD) WITH PROPOFOL;  Surgeon: Regis Bill, MD;  Location: [facility] ENDOSCOPY;  Service: Endoscopy;  Laterality: N/A;   ESOPHAGOGASTRODUODENOSCOPY (EGD) WITH PROPOFOL N/A 02/18/2020   Procedure: ESOPHAGOGASTRODUODENOSCOPY (EGD) WITH PROPOFOL;  Surgeon: Regis Bill, MD;  Location: [facility] ENDOSCOPY;  Service: Endoscopy;  Laterality: N/A;   ESOPHAGOGASTRODUODENOSCOPY (EGD) WITH PROPOFOL N/A 08/02/2021   Procedure: ESOPHAGOGASTRODUODENOSCOPY (EGD) WITH PROPOFOL;  Surgeon: Regis Bill, MD;  Location: [facility] ENDOSCOPY;  Service: Endoscopy;  Laterality: N/A;  REQUEST AM   ESOPHAGOGASTRODUODENOSCOPY (EGD) WITH PROPOFOL N/A 10/05/2021   Procedure: ESOPHAGOGASTRODUODENOSCOPY (EGD) WITH PROPOFOL;  Surgeon: Regis Bill, MD;  Location: [facility] ENDOSCOPY;  Service: Endoscopy;  Laterality: N/A;   FRACTURE SURGERY Right 05/28/2019  distal radius fracture   HERNIA REPAIR     left inguinal hernia repair as an infant   KYPHOPLASTY N/A 02/11/2016   Procedure: KYPHOPLASTY  L2,T9;  Surgeon: Kennedy Bucker, MD;  Location: [facility] ORS;  Service: Orthopedics;   Laterality: N/A;   KYPHOPLASTY N/A 03/15/2016   Procedure: KYPHOPLASTY L3;  Surgeon: Kennedy Bucker, MD;  Location: [facility] ORS;  Service: Orthopedics;  Laterality: N/A;   OPEN REDUCTION INTERNAL FIXATION (ORIF) DISTAL RADIAL FRACTURE Right 05/28/2019   Procedure: OPEN REDUCTION INTERNAL FIXATION (ORIF) DISTAL RADIAL FRACTURE;  Surgeon: Kennedy Bucker, MD;  Location: [facility] ORS;  Service: Orthopedics;  Laterality: Right;   skin cancer removed     TONSILLECTOMY     Patient Active Problem List   Diagnosis Date Noted   Acute respiratory failure due to COVID-19 (HCC) 05/28/2019   Alcohol dependence with uncomplicated withdrawal (HCC) 01/23/2018   Alcohol withdrawal (HCC) 01/23/2018   GIB (gastrointestinal bleeding) 12/09/2017   A-fib (HCC) 12/09/2017   Esophageal obstruction 10/10/2016   Esophageal ulceration 10/10/2016   GI bleed 09/20/2016   Low vitamin B12 level 03/18/2016   S/P kyphoplasty 03/18/2016   Basal cell carcinoma 02/08/2016   Atrial fibrillation (HCC) 01/27/2016   Colitis 01/27/2016   HLD (hyperlipidemia) 01/27/2016   Anxiety 01/27/2016   Hypokalemia 01/27/2016   Major depressive disorder, recurrent episode, moderate (HCC) 04/14/2015   Anxiety, generalized 12/06/2013   Parkinson disease 08/07/2013   Dysphagia 05/12/2013   Esophageal mass 05/12/2013   GERD (gastroesophageal reflux disease) 05/12/2013   Vomiting 05/12/2013   Open bite of lower leg 05/10/2013   Dog bite of lower leg 05/10/2013   Abnormal finding on liver function 03/25/2013   Benign neoplasm 03/25/2013   Dizziness 03/25/2013   Type 2 diabetes mellitus (HCC) 03/25/2013   BP (high blood pressure) 03/25/2013   Awareness of heartbeats 03/25/2013   Pure hypercholesterolemia 03/25/2013   Infective tonsillitis 03/25/2013   Adenomatous polyp 03/25/2013   ONSET DATE: 10 years   REFERRING DIAG: Parkinson's Disease  THERAPY DIAG:  Muscle weakness (generalized)  Other lack of coordination  Rationale for  Evaluation and Treatment: Rehabilitation  SUBJECTIVE:  SUBJECTIVE STATEMENT: Pt reports doing well today.   Pt accompanied by: self; spouse present in lobby, but did not follow for eval  PERTINENT HISTORY: Pt has been working with outpatient PT here at [facility] since end of Feb 2024.  OT ordered d/t worsening PD symptoms contributing to functional decline with ADLs.   PRECAUTIONS: Fall  WEIGHT BEARING RESTRICTIONS: No  PAIN:  Are you having pain? No  FALLS: Has patient fallen in last 6 months? Yes. Number of falls 1  LIVING ENVIRONMENT: Lives with: lives with their spouse Lives in: split level but pt remains on main level to avoid steps Stairs: Yes: Internal: 2 steps; can reach both Has following equipment at home: Quad cane small base, Walker - 4 wheeled, bed side commode, Grab bars, and pull down seat for shower, hand held shower hose,   PLOF: Needs assistance with ADLs; pt reports that spouse assists but is consistent to always let pt try things first before she automatically helps.    PATIENT GOALS: To be more indep  OBJECTIVE:  HAND DOMINANCE: Right (most affected side from PD)  ADLs: Overall ADLs: spouse is primary caregiver Transfers/ambulation related to ADLs: uses RW Eating: spouse cuts food; pt reports having to use L non-dominant hand to eat for the last 5-6 yrs Grooming: set up; assist to squeeze toothpaste and denture paste; uses  L non-dominant hand UB Dressing: set up; assist with clothing fasteners LB Dressing: Min A; assist with socks, dons slip on shoes with set up (has tried a sockaid but was unsuccessful) Toileting: modified indep Bathing: Set up/min A; spouse squeezes shampoo into pt's hands, spouse helps to towel dry  Tub Shower transfers: distant supv Equipment:  see above   IADLs: Shopping: pt can accompany spouse by pushing a shopping cart.  Light housekeeping: pt can drag trash to bin and can roll the bin to the road and back Meal Prep: spouse  manages  Community mobility: spouse drives; uses rollator for longer distances  Medication management: spouse manages pills using weekly pill organizer.   Financial management: spouse manages  Handwriting: Mild micrographia and <25% legible  MOBILITY STATUS: Hx of falls  POSTURE COMMENTS:  rounded shoulders and forward head, R shoulder elevated  ACTIVITY TOLERANCE: Activity tolerance: TBD and assessed further with functional activities   FUNCTIONAL OUTCOME MEASURES: FOTO: TBD   UPPER EXTREMITY ROM:    Active ROM Right eval Left eval  Shoulder flexion 118 85  Shoulder abduction 128 124  Shoulder adduction    Shoulder extension    Shoulder internal rotation [facility] WFL  Shoulder external rotation [facility] WFL  Elbow flexion    Elbow extension    Wrist flexion 55 76  Wrist extension 57 75  Wrist ulnar deviation    Wrist radial deviation    Wrist pronation [facility] WFL  Wrist supination 63 75  (Blank rows = not tested)  UPPER EXTREMITY MMT:     MMT Right eval Left eval  Shoulder flexion 4 4  Shoulder abduction 4+ 4+  Shoulder adduction    Shoulder extension    Shoulder internal rotation    Shoulder external rotation    Middle trapezius    Lower trapezius    Elbow flexion 4+ 4+  Elbow extension 4+ 4+  Wrist flexion 4+ 4+  Wrist extension 4+ 4+  Wrist ulnar deviation    Wrist radial deviation    Wrist pronation    Wrist supination    (Blank rows = not tested)  HAND FUNCTION: Grip strength: Right: 16 lbs; Left: 38 lbs, Lateral pinch: Right: 10 lbs, Left: 14 lbs, and 3 point pinch: Right: 9 lbs, Left: 11 lbs  COORDINATION: 9 Hole Peg test: Right: 38 sec; Left: 40 sec  SENSATION: WFL  EDEMA: None  MUSCLE TONE: R/L normal  COGNITION: Overall cognitive status: Within functional limits for tasks assessed  VISION: Subjective report: wears glasses all the time    PRAXIS: Impaired: Motor planning  OBSERVATIONS:  R SF PIP flexion contracture 105* from 5-6 years  ago from a fall  TODAY'S TREATMENT:                                                                                                                              DATE: 06/02/22  Therapeutic Exercise: Issued turquoise theraputty and instructed pt in  strengthening and coordination exercises for R/L hands, including gross grasping, lateral/2 point/3 point pinching, digit abd/add, and digging coins out of putty.  Able to return demo with intermittent vc for technique to improve quality of movement.  Encouraged completion 5-10 min, 1-2x per day.    Self Care: Pt practiced handwriting skills with trial of a variety of pen types and pen grips.  Pt practiced writing name/date/initials on lined paper, cues for positioning self close to table and to rest elbows and forearms on table tops for increased distal stability.  Practiced using L hand over R wrist to minimize R hand tremor while writing.  Pt practiced with 1 and 2 pen weights and found benefit from 1.   PATIENT EDUCATION: Education details: Theraputty Person educated: Patient Education method: Explanation and Verbal cues, demo Education comprehension: verbalized understanding, demonstrated understanding; min vc for technique  HOME EXERCISE PROGRAM: Theraputty   GOALS: Goals reviewed with patient? Yes  SHORT TERM GOALS: Target date: 07/07/22 (6 weeks)  Pt will be indep to perform HEP for improving bilat hand strength and coordination skills. Baseline: Eval: not yet initiated Goal status: INITIAL  LONG TERM GOALS: Target date: 08/18/22 (12 weeks)  Pt will increase FOTO score to (TBD) or better to indicate improvement in self perceived functional use of the R arm with daily tasks. Baseline: Eval: TBD Goal status: INITIAL  2.  Pt will increase R grip strength by 5 or more lbs to more easily hold and stabilize ADL supplies in the R dominant hand. Baseline: R grip 16 lbs; limited from PD but also by 5th digit PIP flexion contracture, as this  digit can not engage in gripping (L 38 lbs) Goal status: INITIAL  3.  Pt will improve R hand FMC/dexterity skills to be able to sign his name on medical/legal documents with 75% legibility, using adapted writing aids as needed. Baseline: Eval: <25% legible and pt verbalizes embarrassment when attempting to sign his name on forms Goal status: INITIAL  4.  Pt will increase R FMC/GMC skills to engage the RUE into UB ADLs at least 50% of the time. Baseline: Eval: Pt uses the L non-dominant arm for self care tasks. Goal status: INITIAL  ASSESSMENT:  CLINICAL IMPRESSION: Initiated theraputty exercises this date; handout issued for home carryover.  Min vc for technique.  Will continue to review as needed.  Focused on handwriting skills with trial of a variety of pen types and pen grips.  Pt practiced with 1 and 2 pen weights and found benefit from 1.  Pt required cues for positioning self close to table and to rest elbows and forearms on table top for increased distal stability.  Practiced using L hand over R wrist to minimize R hand tremor while writing with some benefit.  Pt will continue to benefit from skilled OT for increasing bilat hand strength, improve coordination, specifically working to increase engagement of the RUE during self care tasks.    PERFORMANCE DEFICITS: in functional skills including ADLs, IADLs, coordination, dexterity, ROM, strength, flexibility, Fine motor control, Gross motor control, mobility, balance, body mechanics, endurance, decreased knowledge of use of DME, and UE functional use, cognitive skills including emotional and memory, and psychosocial skills including coping strategies, environmental adaptation, habits, and routines and behaviors.   IMPAIRMENTS: are limiting patient from ADLs, IADLs, and leisure.   CO-MORBIDITIES: has co-morbidities such as anxiety, depression  that affects occupational performance. Patient will benefit from skilled OT to address above  impairments and improve overall function.  MODIFICATION OR ASSISTANCE TO COMPLETE EVALUATION: No modification of tasks or assist necessary to complete an evaluation.  OT OCCUPATIONAL PROFILE AND HISTORY: Problem focused assessment: Including review of records relating to presenting problem.  CLINICAL DECISION MAKING: Moderate - several treatment options, min-mod task modification necessary  REHAB POTENTIAL: Good for goals  EVALUATION COMPLEXITY: Moderate    PLAN:  OT FREQUENCY: 2x/week  OT DURATION: 12 weeks  PLANNED INTERVENTIONS: self care/ADL training, therapeutic exercise, therapeutic activity, neuromuscular re-education, manual therapy, passive range of motion, balance training, functional mobility training, moist heat, cryotherapy, patient/family education, cognitive remediation/compensation, psychosocial skills training, energy conservation, coping strategies training, and DME and/or AE instructions  RECOMMENDED OTHER SERVICES: None at this time  CONSULTED AND AGREED WITH PLAN OF CARE: Patient  PLAN FOR NEXT SESSION: initiate HEP, complete FOTO  Danelle Earthly, MS, OTR/L  [employer] Dials, OT 06/03/2022, 9:21 PM

## 2022-06-02 NOTE — Therapy (Signed)
Outpatient Physical Therapy Treatment    Patient Details  Name: Charles Stanton MRN: [id number] Date of Birth: 1955/03/13 Referring Provider (PT): Dr. Cristopher Peru   Encounter Date: 06/02/2022  END OF SESSION:    PT End of Session - 06/02/22 1151     Visit Number 26    Number of Visits 40    Date for PT Re-Evaluation 07/19/22    Authorization Type Medicare A & B primary; federal employee secondary    Authorization Time Period -07/19/22    Progress Note Due on Visit 30    PT Start Time 1143    PT Stop Time 1225    PT Time Calculation (min) 42 min    Equipment Utilized During Treatment Gait belt    Activity Tolerance Patient tolerated treatment well;No increased pain    Behavior During Therapy [facility] for tasks assessed/performed                          Past Medical History:  Diagnosis Date   Abnormal liver function    Anemia    Anxiety    Atrial fibrillation (HCC)    Cancer (HCC) 2017   skin   Colonic mass    Depression    Dizziness    Dry cough    History of colon polyps    Hypercholesteremia    Hyperlipemia    Hypertension    Palpitations    Parkinson's disease    Parkinson's disease    PVD (peripheral vascular disease) (HCC)    Tonsillitis 03/25/2013   Tremors of nervous system     Past Surgical History:  Procedure Laterality Date   BACK SURGERY     Kyphoplasty   March 2018   COLONOSCOPY     COLONOSCOPY WITH PROPOFOL N/A 12/12/2017   Procedure: COLONOSCOPY WITH PROPOFOL;  Surgeon: Wyline Mood, MD;  Location: [facility] ENDOSCOPY;  Service: Gastroenterology;  Laterality: N/A;   ESOPHAGOGASTRODUODENOSCOPY N/A 12/11/2017   Procedure: ESOPHAGOGASTRODUODENOSCOPY (EGD);  Surgeon: Wyline Mood, MD;  Location: [facility] ENDOSCOPY;  Service: Gastroenterology;  Laterality: N/A;   ESOPHAGOGASTRODUODENOSCOPY N/A 10/08/2021   Procedure: ESOPHAGOGASTRODUODENOSCOPY (EGD);  Surgeon: Regis Bill, MD;  Location: [facility] ENDOSCOPY;  Service: Endoscopy;  Laterality:  N/A;   ESOPHAGOGASTRODUODENOSCOPY (EGD) WITH PROPOFOL N/A 09/21/2016   Procedure: ESOPHAGOGASTRODUODENOSCOPY (EGD) WITH PROPOFOL;  Surgeon: Wyline Mood, MD;  Location: [facility] ENDOSCOPY;  Service: Gastroenterology;  Laterality: N/A;   ESOPHAGOGASTRODUODENOSCOPY (EGD) WITH PROPOFOL N/A 11/07/2016   Procedure: ESOPHAGOGASTRODUODENOSCOPY (EGD) WITH PROPOFOL;  Surgeon: Wyline Mood, MD;  Location: [facility] ENDOSCOPY;  Service: Gastroenterology;  Laterality: N/A;   ESOPHAGOGASTRODUODENOSCOPY (EGD) WITH PROPOFOL N/A 10/15/2019   Procedure: ESOPHAGOGASTRODUODENOSCOPY (EGD) WITH PROPOFOL;  Surgeon: Regis Bill, MD;  Location: [facility] ENDOSCOPY;  Service: Endoscopy;  Laterality: N/A;   ESOPHAGOGASTRODUODENOSCOPY (EGD) WITH PROPOFOL N/A 10/28/2019   Procedure: ESOPHAGOGASTRODUODENOSCOPY (EGD) WITH PROPOFOL;  Surgeon: Regis Bill, MD;  Location: [facility] ENDOSCOPY;  Service: Endoscopy;  Laterality: N/A;   ESOPHAGOGASTRODUODENOSCOPY (EGD) WITH PROPOFOL N/A 12/02/2019   Procedure: ESOPHAGOGASTRODUODENOSCOPY (EGD) WITH PROPOFOL;  Surgeon: Regis Bill, MD;  Location: [facility] ENDOSCOPY;  Service: Endoscopy;  Laterality: N/A;   ESOPHAGOGASTRODUODENOSCOPY (EGD) WITH PROPOFOL N/A 02/18/2020   Procedure: ESOPHAGOGASTRODUODENOSCOPY (EGD) WITH PROPOFOL;  Surgeon: Regis Bill, MD;  Location: [facility] ENDOSCOPY;  Service: Endoscopy;  Laterality: N/A;   ESOPHAGOGASTRODUODENOSCOPY (EGD) WITH PROPOFOL N/A 08/02/2021   Procedure: ESOPHAGOGASTRODUODENOSCOPY (EGD) WITH PROPOFOL;  Surgeon: Regis Bill, MD;  Location: [facility] ENDOSCOPY;  Service: Endoscopy;  Laterality: N/A;  REQUEST AM   ESOPHAGOGASTRODUODENOSCOPY (EGD) WITH PROPOFOL N/A 10/05/2021   Procedure: ESOPHAGOGASTRODUODENOSCOPY (EGD) WITH PROPOFOL;  Surgeon: Regis Bill, MD;  Location: [facility] ENDOSCOPY;  Service: Endoscopy;  Laterality: N/A;   FRACTURE SURGERY Right 05/28/2019   distal radius fracture   HERNIA REPAIR     left inguinal hernia repair as  an infant   KYPHOPLASTY N/A 02/11/2016   Procedure: KYPHOPLASTY  L2,T9;  Surgeon: Kennedy Bucker, MD;  Location: [facility] ORS;  Service: Orthopedics;  Laterality: N/A;   KYPHOPLASTY N/A 03/15/2016   Procedure: KYPHOPLASTY L3;  Surgeon: Kennedy Bucker, MD;  Location: [facility] ORS;  Service: Orthopedics;  Laterality: N/A;   OPEN REDUCTION INTERNAL FIXATION (ORIF) DISTAL RADIAL FRACTURE Right 05/28/2019   Procedure: OPEN REDUCTION INTERNAL FIXATION (ORIF) DISTAL RADIAL FRACTURE;  Surgeon: Kennedy Bucker, MD;  Location: [facility] ORS;  Service: Orthopedics;  Laterality: Right;   skin cancer removed     TONSILLECTOMY       Subjective Assessment -      Subjective Pt reports no significant changes since last session and is doing well overall. Has a dance recital to go to this weekend for his grand DTR.    Pertinent History Marquelle Whittington is a 66yoM who was prescribed with Parkinson's Disease ~ 10 years.  Pt is followed by neurology, reports good reponse to medications which help control his tremors somewhat. Pt uses a SPC for AMB, also uses a walker for longer distance walking. Pt goes into community almost daily (they like to eat out) which includes lots of SPC [place] eand navigation of 4 stairs at home.    Currently in Pain? No/denies             TREATMENT   Exercise/Activity Sets/Reps/Time/ Resistance Assistance Charge type Comments  Octane fitness machine ( seated elyptical Level 2 x 6 min  Rest break x 2 min at minute 3 secondary to fatigue   TE For aerobic priming and instruction in future gym based exercise program  Tilt 3 height 4 with UE and LE reciprocal movement   Modified to standing with UE on chairs PWR! Moves ( up, rock, twist, step)  X 10 ea   NMR     X 45 sec ea LE   TE To improve available ROM .   Ambualtion with AW and SPC 2.5# AW 1 x 320 ft  X 4 laps figure 8 pattern  CGA NMR Cues for sequencing cane with steps.  Used metronome app on phone for 50 BPM for optimization of cane sequencing with  gait -good turning ability maintaining   Rows GTB 2 x 12   TE   LAQ X 10 ea 2.5# AW   TE    STS with PWR! And then transition to [facility]! Step  X 10  (5 to ea side)  CGA NMR Continues to require cues for proper form with this activity  Treatment Provided this session    Pt educated throughout session about proper posture and technique with exercises. Improved exercise technique, movement at target joints, use of target muscles after min to mod verbal, visual, tactile cues. Note: Portions of this document were prepared using Dragon voice recognition software and although reviewed may contain unintentional dictation errors in syntax, grammar, or spelling.          PT Education -     Education Details Pt educated regarding benefits of virtual urgent care and how to access virtual urgent care.  Person(s) Educated Patient    Methods Explanation;Demonstration ; handout   Comprehension Verbalized understanding;Returned demonstration;Need further instruction              PT Short Term Goals -       PT SHORT TERM GOAL #1   Title Pt to report regular performance of exercises prescribed for home and a sense of improvements in strength and balance AEB them not being as challenging anymore.    Baseline Will issue on visit 2    Time 4    Period Weeks    Status met   Target Date 03/15/22      PT SHORT TERM GOAL #2   Title Pt to demonstrate improved power AEB improved 5xSTS hands free in <18sec.    Baseline 03/01/22: 22sec hands free, author provideing foot block bilat 03/31/22: 14.82sec 4/23:17.3 sec   Time 4    Period Weeks    Status Met    Target Date 03/29/22               PT Long Term Goals - Target goal date for all remaining long term goals is 07/19/2022        PT LONG TERM GOAL #1   Title Pt to improve score on FOTO survey to 57 to indicate reduced difficulty with basic mobility required for ADL performance.    Baseline 03/01/22:49 04/29/22:56%   Time 8     Period Weeks    Status ONGOING          PT LONG TERM GOAL #2   Title Pt to improve tolerance to overground AMB c SPC AEB performance >857ft.    Baseline 2/27: fatigued after 482ft lap performed in four minutes ten seconds. 03/31/22: 724ft with SPC no rest break 04/26/22: 794 ft    Time 8    Period Weeks    Status Progressing/ ONGOING         PT LONG TERM GOAL #3   Title Pt to demonstrate improved leg power AEB 5xSTS from chair <14sec hands free and without need for minGuard assist for safety and without need for foot block.    Baseline 03/01/22: 22sec hands free, feet blocked, minGuard assist  03/31/22: 14.82sec  4/23:17.3 sec   Time 8     Period Weeks    Status ONGOING          PT LONG TERM GOAL #4   Title Pt to demonstrate improvement in balance performance AEB >6 improvement BERG Balance for balance assessment.    Baseline 41 on 03/03/22. 3/38/24: 42 4/23: 45   Time 8    Period Weeks    Status PROGRESSING / ONGOING          5.  Pt will improve distance with LRAD to 900 feet or greater in order to indicate further progress to community ambulation distances Baseline: 794 ft with SPC  Goal status: INITIAL    Plan     Clinical Impression Statement Patient presents with good motivation for completion of physical therapy activities. Patient continues to progress with lower extremity strengthening, endurance, and Parkinson's disease specific exercises and activities.Pt will continue to benefit from skilled physical therapy intervention to address impairments, improve QOL, and attain therapy goals.      Personal Factors and Comorbidities Time since onset of injury/illness/exacerbation;Past/Current Experience    Examination-Activity Limitations Locomotion Level;Transfers;Bed Mobility;Reach Overhead;Hygiene/Grooming;Dressing;Toileting;Stairs;Bend    Stability/Clinical Decision Making Evolving/Moderate complexity    Clinical Decision Making Moderate    Rehab  Potential Good     PT Frequency 2x / week    PT Duration 8 weeks    PT Treatment/Interventions ADLs/Self Care Home Management;Moist Heat;Gait training;Stair training;Functional mobility training;Therapeutic activities;Therapeutic exercise;Balance training;Neuromuscular re-education;Patient/family education    PT Next Visit Plan .  STS PWR! Up with eventual addition of PWR! Step. Work on weight shift cues with STS. Dynamic balance/gait training as indicated.    PT Home Exercise Plan 03/01/22: discussed finding and elevated sit surface for future STS exercises (something ~18-19 inches high)    Consulted and Agree with Plan of Care Patient             Patient will benefit from skilled therapeutic intervention in order to improve the following deficits and impairments:  balance, strength, coordination, gait, transfers. Posture. ROM   Visit Diagnosis: Unsteadiness on feet  Difficulty in walking, not elsewhere classified  Other abnormalities of gait and mobility    Problem List Patient Active Problem List   Diagnosis Date Noted   Acute respiratory failure due to COVID-19 (HCC) 05/28/2019   Alcohol dependence with uncomplicated withdrawal (HCC) 01/23/2018   Alcohol withdrawal (HCC) 01/23/2018   GIB (gastrointestinal bleeding) 12/09/2017   A-fib (HCC) 12/09/2017   Esophageal obstruction 10/10/2016   Esophageal ulceration 10/10/2016   GI bleed 09/20/2016   Low vitamin B12 level 03/18/2016   S/P kyphoplasty 03/18/2016   Basal cell carcinoma 02/08/2016   Atrial fibrillation (HCC) 01/27/2016   Colitis 01/27/2016   HLD (hyperlipidemia) 01/27/2016   Anxiety 01/27/2016   Hypokalemia 01/27/2016   Major depressive disorder, recurrent episode, moderate (HCC) 04/14/2015   Anxiety, generalized 12/06/2013   Parkinson disease 08/07/2013   Dysphagia 05/12/2013   Esophageal mass 05/12/2013   GERD (gastroesophageal reflux disease) 05/12/2013   Vomiting 05/12/2013   Open bite of lower leg 05/10/2013   Dog  bite of lower leg 05/10/2013   Abnormal finding on liver function 03/25/2013   Benign neoplasm 03/25/2013   Dizziness 03/25/2013   Type 2 diabetes mellitus (HCC) 03/25/2013   BP (high blood pressure) 03/25/2013   Awareness of heartbeats 03/25/2013   Pure hypercholesterolemia 03/25/2013   Infective tonsillitis 03/25/2013   Adenomatous polyp 03/25/2013      Norman Herrlich, PT 06/02/2022, 11:54 AM  [facility] at [facility] [address] [place], [place], [address] Phone: [phone]   Fax:  [phone]

## 2022-06-07 ENCOUNTER — Ambulatory Visit: Payer: Medicare Other | Admitting: Occupational Therapy

## 2022-06-07 ENCOUNTER — Ambulatory Visit: Payer: Medicare Other | Attending: Neurology | Admitting: Physical Therapy

## 2022-06-07 ENCOUNTER — Encounter: Payer: Self-pay | Admitting: Physical Therapy

## 2022-06-07 DIAGNOSIS — R262 Difficulty in walking, not elsewhere classified: Secondary | ICD-10-CM | POA: Diagnosis present

## 2022-06-07 DIAGNOSIS — R2681 Unsteadiness on feet: Secondary | ICD-10-CM | POA: Diagnosis present

## 2022-06-07 DIAGNOSIS — R278 Other lack of coordination: Secondary | ICD-10-CM | POA: Insufficient documentation

## 2022-06-07 DIAGNOSIS — H543 Unqualified visual loss, both eyes: Secondary | ICD-10-CM | POA: Insufficient documentation

## 2022-06-07 DIAGNOSIS — M6281 Muscle weakness (generalized): Secondary | ICD-10-CM

## 2022-06-07 DIAGNOSIS — R2689 Other abnormalities of gait and mobility: Secondary | ICD-10-CM | POA: Diagnosis present

## 2022-06-07 DIAGNOSIS — R251 Tremor, unspecified: Secondary | ICD-10-CM | POA: Diagnosis present

## 2022-06-07 NOTE — Therapy (Signed)
OUTPATIENT OCCUPATIONAL THERAPY NEURO EVALUATION  Patient Name: Charles Stanton MRN: [id number] DOB:1955-11-19, 67 y.o., male Today's Date: 06/07/2022  PCP: Dr. Barbette Reichmann REFERRING PROVIDER: Dr. Cristopher Peru  END OF SESSION:  OT End of Session - 06/07/22 1504     Visit Number 3    Number of Visits 24    Date for OT Re-Evaluation 08/18/22    OT Start Time 1345    OT Stop Time 1430    OT Time Calculation (min) 45 min    Activity Tolerance Patient tolerated treatment well    Behavior During Therapy [facility] for tasks assessed/performed            Past Medical History:  Diagnosis Date   Abnormal liver function    Anemia    Anxiety    Atrial fibrillation (HCC)    Cancer (HCC) 2017   skin   Colonic mass    Depression    Dizziness    Dry cough    History of colon polyps    Hypercholesteremia    Hyperlipemia    Hypertension    Palpitations    Parkinson's disease    Parkinson's disease    PVD (peripheral vascular disease) (HCC)    Tonsillitis 03/25/2013   Tremors of nervous system    Past Surgical History:  Procedure Laterality Date   BACK SURGERY     Kyphoplasty   March 2018   COLONOSCOPY     COLONOSCOPY WITH PROPOFOL N/A 12/12/2017   Procedure: COLONOSCOPY WITH PROPOFOL;  Surgeon: Wyline Mood, MD;  Location: [facility] ENDOSCOPY;  Service: Gastroenterology;  Laterality: N/A;   ESOPHAGOGASTRODUODENOSCOPY N/A 12/11/2017   Procedure: ESOPHAGOGASTRODUODENOSCOPY (EGD);  Surgeon: Wyline Mood, MD;  Location: [facility] ENDOSCOPY;  Service: Gastroenterology;  Laterality: N/A;   ESOPHAGOGASTRODUODENOSCOPY N/A 10/08/2021   Procedure: ESOPHAGOGASTRODUODENOSCOPY (EGD);  Surgeon: Regis Bill, MD;  Location: [facility] ENDOSCOPY;  Service: Endoscopy;  Laterality: N/A;   ESOPHAGOGASTRODUODENOSCOPY (EGD) WITH PROPOFOL N/A 09/21/2016   Procedure: ESOPHAGOGASTRODUODENOSCOPY (EGD) WITH PROPOFOL;  Surgeon: Wyline Mood, MD;  Location: [facility] ENDOSCOPY;  Service: Gastroenterology;  Laterality: N/A;    ESOPHAGOGASTRODUODENOSCOPY (EGD) WITH PROPOFOL N/A 11/07/2016   Procedure: ESOPHAGOGASTRODUODENOSCOPY (EGD) WITH PROPOFOL;  Surgeon: Wyline Mood, MD;  Location: [facility] ENDOSCOPY;  Service: Gastroenterology;  Laterality: N/A;   ESOPHAGOGASTRODUODENOSCOPY (EGD) WITH PROPOFOL N/A 10/15/2019   Procedure: ESOPHAGOGASTRODUODENOSCOPY (EGD) WITH PROPOFOL;  Surgeon: Regis Bill, MD;  Location: [facility] ENDOSCOPY;  Service: Endoscopy;  Laterality: N/A;   ESOPHAGOGASTRODUODENOSCOPY (EGD) WITH PROPOFOL N/A 10/28/2019   Procedure: ESOPHAGOGASTRODUODENOSCOPY (EGD) WITH PROPOFOL;  Surgeon: Regis Bill, MD;  Location: [facility] ENDOSCOPY;  Service: Endoscopy;  Laterality: N/A;   ESOPHAGOGASTRODUODENOSCOPY (EGD) WITH PROPOFOL N/A 12/02/2019   Procedure: ESOPHAGOGASTRODUODENOSCOPY (EGD) WITH PROPOFOL;  Surgeon: Regis Bill, MD;  Location: [facility] ENDOSCOPY;  Service: Endoscopy;  Laterality: N/A;   ESOPHAGOGASTRODUODENOSCOPY (EGD) WITH PROPOFOL N/A 02/18/2020   Procedure: ESOPHAGOGASTRODUODENOSCOPY (EGD) WITH PROPOFOL;  Surgeon: Regis Bill, MD;  Location: [facility] ENDOSCOPY;  Service: Endoscopy;  Laterality: N/A;   ESOPHAGOGASTRODUODENOSCOPY (EGD) WITH PROPOFOL N/A 08/02/2021   Procedure: ESOPHAGOGASTRODUODENOSCOPY (EGD) WITH PROPOFOL;  Surgeon: Regis Bill, MD;  Location: [facility] ENDOSCOPY;  Service: Endoscopy;  Laterality: N/A;  REQUEST AM   ESOPHAGOGASTRODUODENOSCOPY (EGD) WITH PROPOFOL N/A 10/05/2021   Procedure: ESOPHAGOGASTRODUODENOSCOPY (EGD) WITH PROPOFOL;  Surgeon: Regis Bill, MD;  Location: [facility] ENDOSCOPY;  Service: Endoscopy;  Laterality: N/A;   FRACTURE SURGERY Right 05/28/2019   distal radius fracture   HERNIA REPAIR     left inguinal hernia repair  as an infant   KYPHOPLASTY N/A 02/11/2016   Procedure: KYPHOPLASTY  L2,T9;  Surgeon: Kennedy Bucker, MD;  Location: [facility] ORS;  Service: Orthopedics;  Laterality: N/A;   KYPHOPLASTY N/A 03/15/2016   Procedure: KYPHOPLASTY L3;  Surgeon:  Kennedy Bucker, MD;  Location: [facility] ORS;  Service: Orthopedics;  Laterality: N/A;   OPEN REDUCTION INTERNAL FIXATION (ORIF) DISTAL RADIAL FRACTURE Right 05/28/2019   Procedure: OPEN REDUCTION INTERNAL FIXATION (ORIF) DISTAL RADIAL FRACTURE;  Surgeon: Kennedy Bucker, MD;  Location: [facility] ORS;  Service: Orthopedics;  Laterality: Right;   skin cancer removed     TONSILLECTOMY     Patient Active Problem List   Diagnosis Date Noted   Acute respiratory failure due to COVID-19 (HCC) 05/28/2019   Alcohol dependence with uncomplicated withdrawal (HCC) 01/23/2018   Alcohol withdrawal (HCC) 01/23/2018   GIB (gastrointestinal bleeding) 12/09/2017   A-fib (HCC) 12/09/2017   Esophageal obstruction 10/10/2016   Esophageal ulceration 10/10/2016   GI bleed 09/20/2016   Low vitamin B12 level 03/18/2016   S/P kyphoplasty 03/18/2016   Basal cell carcinoma 02/08/2016   Atrial fibrillation (HCC) 01/27/2016   Colitis 01/27/2016   HLD (hyperlipidemia) 01/27/2016   Anxiety 01/27/2016   Hypokalemia 01/27/2016   Major depressive disorder, recurrent episode, moderate (HCC) 04/14/2015   Anxiety, generalized 12/06/2013   Parkinson disease 08/07/2013   Dysphagia 05/12/2013   Esophageal mass 05/12/2013   GERD (gastroesophageal reflux disease) 05/12/2013   Vomiting 05/12/2013   Open bite of lower leg 05/10/2013   Dog bite of lower leg 05/10/2013   Abnormal finding on liver function 03/25/2013   Benign neoplasm 03/25/2013   Dizziness 03/25/2013   Type 2 diabetes mellitus (HCC) 03/25/2013   BP (high blood pressure) 03/25/2013   Awareness of heartbeats 03/25/2013   Pure hypercholesterolemia 03/25/2013   Infective tonsillitis 03/25/2013   Adenomatous polyp 03/25/2013   ONSET DATE: 10 years   REFERRING DIAG: Parkinson's Disease  THERAPY DIAG:  Muscle weakness (generalized)  Rationale for Evaluation and Treatment: Rehabilitation  SUBJECTIVE:  SUBJECTIVE STATEMENT: Pt reports doing well today.   Pt  accompanied by: self; spouse present in lobby, but did not follow for eval  PERTINENT HISTORY: Pt has been working with outpatient PT here at [facility] since end of Feb 2024.  OT ordered d/t worsening PD symptoms contributing to functional decline with ADLs.   PRECAUTIONS: Fall  WEIGHT BEARING RESTRICTIONS: No  PAIN:  Are you having pain? No  FALLS: Has patient fallen in last 6 months? Yes. Number of falls 1  LIVING ENVIRONMENT: Lives with: lives with their spouse Lives in: split level but pt remains on main level to avoid steps Stairs: Yes: Internal: 2 steps; can reach both Has following equipment at home: Quad cane small base, Walker - 4 wheeled, bed side commode, Grab bars, and pull down seat for shower, hand held shower hose,   PLOF: Needs assistance with ADLs; pt reports that spouse assists but is consistent to always let pt try things first before she automatically helps.    PATIENT GOALS: To be more indep  OBJECTIVE:  HAND DOMINANCE: Right (most affected side from PD)  ADLs: Overall ADLs: spouse is primary caregiver Transfers/ambulation related to ADLs: uses RW Eating: spouse cuts food; pt reports having to use L non-dominant hand to eat for the last 5-6 yrs Grooming: set up; assist to squeeze toothpaste and denture paste; uses L non-dominant hand UB Dressing: set up; assist with clothing fasteners LB Dressing: Min A; assist with socks, dons slip  on shoes with set up (has tried a sockaid but was unsuccessful) Toileting: modified indep Bathing: Set up/min A; spouse squeezes shampoo into pt's hands, spouse helps to towel dry  Tub Shower transfers: distant supv Equipment:  see above   IADLs: Shopping: pt can accompany spouse by pushing a shopping cart.  Light housekeeping: pt can drag trash to bin and can roll the bin to the road and back Meal Prep: spouse manages  Community mobility: spouse drives; uses rollator for longer distances  Medication management: spouse manages  pills using weekly pill organizer.   Financial management: spouse manages  Handwriting: Mild micrographia and <25% legible  MOBILITY STATUS: Hx of falls  POSTURE COMMENTS:  rounded shoulders and forward head, R shoulder elevated  ACTIVITY TOLERANCE: Activity tolerance: TBD and assessed further with functional activities   FUNCTIONAL OUTCOME MEASURES: FOTO: TBD   UPPER EXTREMITY ROM:    Active ROM Right eval Left eval  Shoulder flexion 118 85  Shoulder abduction 128 124  Shoulder adduction    Shoulder extension    Shoulder internal rotation [facility] WFL  Shoulder external rotation [facility] WFL  Elbow flexion    Elbow extension    Wrist flexion 55 76  Wrist extension 57 75  Wrist ulnar deviation    Wrist radial deviation    Wrist pronation [facility] WFL  Wrist supination 63 75  (Blank rows = not tested)  UPPER EXTREMITY MMT:     MMT Right eval Left eval  Shoulder flexion 4 4  Shoulder abduction 4+ 4+  Shoulder adduction    Shoulder extension    Shoulder internal rotation    Shoulder external rotation    Middle trapezius    Lower trapezius    Elbow flexion 4+ 4+  Elbow extension 4+ 4+  Wrist flexion 4+ 4+  Wrist extension 4+ 4+  Wrist ulnar deviation    Wrist radial deviation    Wrist pronation    Wrist supination    (Blank rows = not tested)  HAND FUNCTION: Grip strength: Right: 16 lbs; Left: 38 lbs, Lateral pinch: Right: 10 lbs, Left: 14 lbs, and 3 point pinch: Right: 9 lbs, Left: 11 lbs  COORDINATION: 9 Hole Peg test: Right: 38 sec; Left: 40 sec  SENSATION: WFL  EDEMA: None  MUSCLE TONE: R/L normal  COGNITION: Overall cognitive status: Within functional limits for tasks assessed  VISION: Subjective report: wears glasses all the time    PRAXIS: Impaired: Motor planning  OBSERVATIONS:  R SF PIP flexion contracture 105* from 5-6 years ago from a fall  TODAY'S TREATMENT:                                                                                                                               DATE: 06/07/22  Therapeutic Exercise:  Pt. worked on grasping one inch resistive cubes alternating thumb opposition to the tip of the 2nd through 4th digits. The board was positioned at a  vertical angle. Pt. worked on pressing them back into place while isolating 2nd through 4th digits.  Pt. worked on 2nd through 4th digit flexion.   Self Care:  Pt. practiced handwriting skills with trials of a variety of  different pens and pen grips. Pt. worked with, and without a 1# cuff weight in place in the right wrist for increased stability with writing. Pt. worked on Careers information officer, and Cabin crew with writing. Pt. Worked on tracing letters, simple designs, and rows of progressively increasing sized circles. Pt. Worked on writing his name, writing 4 and 5 letter words, and writing a sentence.   PATIENT EDUCATION: Education details: Theraputty Person educated: Patient Education method: Explanation and Verbal cues, demo Education comprehension: verbalized understanding, demonstrated understanding; min vc for technique  HOME EXERCISE PROGRAM: Theraputty   GOALS: Goals reviewed with patient? Yes  SHORT TERM GOALS: Target date: 07/07/22 (6 weeks)  Pt will be indep to perform HEP for improving bilat hand strength and coordination skills. Baseline: Eval: not yet initiated Goal status: INITIAL  LONG TERM GOALS: Target date: 08/18/22 (12 weeks)  Pt will increase FOTO score to (TBD) or better to indicate improvement in self perceived functional use of the R arm with daily tasks. Baseline: Eval: TBD Goal status: INITIAL  2.  Pt will increase R grip strength by 5 or more lbs to more easily hold and stabilize ADL supplies in the R dominant hand. Baseline: R grip 16 lbs; limited from PD but also by 5th digit PIP flexion contracture, as this digit can not engage in gripping (L 38 lbs) Goal status: INITIAL  3.  Pt will improve R hand FMC/dexterity skills  to be able to sign his name on medical/legal documents with 75% legibility, using adapted writing aids as needed. Baseline: Eval: <25% legible and pt verbalizes embarrassment when attempting to sign his name on forms Goal status: INITIAL  4.  Pt will increase R FMC/GMC skills to engage the RUE into UB ADLs at least 50% of the time. Baseline: Eval: Pt uses the L non-dominant arm for self care tasks. Goal status: INITIAL  ASSESSMENT:  CLINICAL IMPRESSION:  After trying several different types of pens, with adaptations Pt. settled on using a standard pen with a built-in cushion. Pt. presented with increased pen control when tracing letters with straight lines. Pt. presented with less control when tracing letters with curves. Pt. presented with difficulty copying simple design  changing the direction from horizontal to vertical. Pt. required cues for proper hand position, and form when  using the DigiFlex, and when grasping items. Pt. will continue to benefit from skilled OT for increasing bilat hand strength, improve coordination, specifically working to increase engagement of the RUE during self care tasks.    PERFORMANCE DEFICITS: in functional skills including ADLs, IADLs, coordination, dexterity, ROM, strength, flexibility, Fine motor control, Gross motor control, mobility, balance, body mechanics, endurance, decreased knowledge of use of DME, and UE functional use, cognitive skills including emotional and memory, and psychosocial skills including coping strategies, environmental adaptation, habits, and routines and behaviors.   IMPAIRMENTS: are limiting patient from ADLs, IADLs, and leisure.   CO-MORBIDITIES: has co-morbidities such as anxiety, depression  that affects occupational performance. Patient will benefit from skilled OT to address above impairments and improve overall function.  MODIFICATION OR ASSISTANCE TO COMPLETE EVALUATION: No modification of tasks or assist necessary to  complete an evaluation.  OT OCCUPATIONAL PROFILE AND HISTORY: Problem focused assessment: Including review of records relating to  presenting problem.  CLINICAL DECISION MAKING: Moderate - several treatment options, min-mod task modification necessary  REHAB POTENTIAL: Good for goals  EVALUATION COMPLEXITY: Moderate    PLAN:  OT FREQUENCY: 2x/week  OT DURATION: 12 weeks  PLANNED INTERVENTIONS: self care/ADL training, therapeutic exercise, therapeutic activity, neuromuscular re-education, manual therapy, passive range of motion, balance training, functional mobility training, moist heat, cryotherapy, patient/family education, cognitive remediation/compensation, psychosocial skills training, energy conservation, coping strategies training, and DME and/or AE instructions  RECOMMENDED OTHER SERVICES: None at this time  CONSULTED AND AGREED WITH PLAN OF CARE: Patient  PLAN FOR NEXT SESSION: initiate HEP, complete FOTO  Olegario Messier, MS, OTR/L

## 2022-06-07 NOTE — Therapy (Signed)
Outpatient Physical Therapy Treatment    Patient Details  Name: Charles Stanton MRN: [id number] Date of Birth: 06-29-1955 Referring Provider (PT): Dr. Cristopher Peru   Encounter Date: 06/07/2022  END OF SESSION:    PT End of Session - 06/07/22 1314     Visit Number 27    Number of Visits 40    Date for PT Re-Evaluation 07/19/22    Authorization Type Medicare A & B primary; federal employee secondary    Authorization Time Period -07/19/22    Progress Note Due on Visit 30    PT Start Time 1302    PT Stop Time 1345    PT Time Calculation (min) 43 min    Equipment Utilized During Treatment Gait belt    Activity Tolerance Patient tolerated treatment well;No increased pain    Behavior During Therapy [facility] for tasks assessed/performed                          Past Medical History:  Diagnosis Date   Abnormal liver function    Anemia    Anxiety    Atrial fibrillation (HCC)    Cancer (HCC) 2017   skin   Colonic mass    Depression    Dizziness    Dry cough    History of colon polyps    Hypercholesteremia    Hyperlipemia    Hypertension    Palpitations    Parkinson's disease    Parkinson's disease    PVD (peripheral vascular disease) (HCC)    Tonsillitis 03/25/2013   Tremors of nervous system     Past Surgical History:  Procedure Laterality Date   BACK SURGERY     Kyphoplasty   March 2018   COLONOSCOPY     COLONOSCOPY WITH PROPOFOL N/A 12/12/2017   Procedure: COLONOSCOPY WITH PROPOFOL;  Surgeon: Wyline Mood, MD;  Location: [facility] ENDOSCOPY;  Service: Gastroenterology;  Laterality: N/A;   ESOPHAGOGASTRODUODENOSCOPY N/A 12/11/2017   Procedure: ESOPHAGOGASTRODUODENOSCOPY (EGD);  Surgeon: Wyline Mood, MD;  Location: [facility] ENDOSCOPY;  Service: Gastroenterology;  Laterality: N/A;   ESOPHAGOGASTRODUODENOSCOPY N/A 10/08/2021   Procedure: ESOPHAGOGASTRODUODENOSCOPY (EGD);  Surgeon: Regis Bill, MD;  Location: [facility] ENDOSCOPY;  Service: Endoscopy;  Laterality:  N/A;   ESOPHAGOGASTRODUODENOSCOPY (EGD) WITH PROPOFOL N/A 09/21/2016   Procedure: ESOPHAGOGASTRODUODENOSCOPY (EGD) WITH PROPOFOL;  Surgeon: Wyline Mood, MD;  Location: [facility] ENDOSCOPY;  Service: Gastroenterology;  Laterality: N/A;   ESOPHAGOGASTRODUODENOSCOPY (EGD) WITH PROPOFOL N/A 11/07/2016   Procedure: ESOPHAGOGASTRODUODENOSCOPY (EGD) WITH PROPOFOL;  Surgeon: Wyline Mood, MD;  Location: [facility] ENDOSCOPY;  Service: Gastroenterology;  Laterality: N/A;   ESOPHAGOGASTRODUODENOSCOPY (EGD) WITH PROPOFOL N/A 10/15/2019   Procedure: ESOPHAGOGASTRODUODENOSCOPY (EGD) WITH PROPOFOL;  Surgeon: Regis Bill, MD;  Location: [facility] ENDOSCOPY;  Service: Endoscopy;  Laterality: N/A;   ESOPHAGOGASTRODUODENOSCOPY (EGD) WITH PROPOFOL N/A 10/28/2019   Procedure: ESOPHAGOGASTRODUODENOSCOPY (EGD) WITH PROPOFOL;  Surgeon: Regis Bill, MD;  Location: [facility] ENDOSCOPY;  Service: Endoscopy;  Laterality: N/A;   ESOPHAGOGASTRODUODENOSCOPY (EGD) WITH PROPOFOL N/A 12/02/2019   Procedure: ESOPHAGOGASTRODUODENOSCOPY (EGD) WITH PROPOFOL;  Surgeon: Regis Bill, MD;  Location: [facility] ENDOSCOPY;  Service: Endoscopy;  Laterality: N/A;   ESOPHAGOGASTRODUODENOSCOPY (EGD) WITH PROPOFOL N/A 02/18/2020   Procedure: ESOPHAGOGASTRODUODENOSCOPY (EGD) WITH PROPOFOL;  Surgeon: Regis Bill, MD;  Location: [facility] ENDOSCOPY;  Service: Endoscopy;  Laterality: N/A;   ESOPHAGOGASTRODUODENOSCOPY (EGD) WITH PROPOFOL N/A 08/02/2021   Procedure: ESOPHAGOGASTRODUODENOSCOPY (EGD) WITH PROPOFOL;  Surgeon: Regis Bill, MD;  Location: [facility] ENDOSCOPY;  Service: Endoscopy;  Laterality: N/A;  REQUEST AM   ESOPHAGOGASTRODUODENOSCOPY (EGD) WITH PROPOFOL N/A 10/05/2021   Procedure: ESOPHAGOGASTRODUODENOSCOPY (EGD) WITH PROPOFOL;  Surgeon: Regis Bill, MD;  Location: [facility] ENDOSCOPY;  Service: Endoscopy;  Laterality: N/A;   FRACTURE SURGERY Right 05/28/2019   distal radius fracture   HERNIA REPAIR     left inguinal hernia repair as  an infant   KYPHOPLASTY N/A 02/11/2016   Procedure: KYPHOPLASTY  L2,T9;  Surgeon: Kennedy Bucker, MD;  Location: [facility] ORS;  Service: Orthopedics;  Laterality: N/A;   KYPHOPLASTY N/A 03/15/2016   Procedure: KYPHOPLASTY L3;  Surgeon: Kennedy Bucker, MD;  Location: [facility] ORS;  Service: Orthopedics;  Laterality: N/A;   OPEN REDUCTION INTERNAL FIXATION (ORIF) DISTAL RADIAL FRACTURE Right 05/28/2019   Procedure: OPEN REDUCTION INTERNAL FIXATION (ORIF) DISTAL RADIAL FRACTURE;  Surgeon: Kennedy Bucker, MD;  Location: [facility] ORS;  Service: Orthopedics;  Laterality: Right;   skin cancer removed     TONSILLECTOMY       Subjective Assessment -      Subjective Pt reports doing well today. Pt denies any recent falls/stumbles since prior session. Pt denies any updates to medications or medical appointment since prior session. Pt reports good compliance with HEP when time permits.     Pertinent History Charles Stanton is a 66yoM who was prescribed with Parkinson's Disease ~ 10 years.  Pt is followed by neurology, reports good reponse to medications which help control his tremors somewhat. Pt uses a SPC for AMB, also uses a walker for longer distance walking. Pt goes into community almost daily (they like to eat out) which includes lots of SPC [place] eand navigation of 4 stairs at home.    Currently in Pain? No/denies             TREATMENT   Exercise/Activity Sets/Reps/Time/ Resistance Assistance Charge type Comments  Octane fitness machine (seated elyptical Level 2 x 6 min  Rest break x 2 min at minute 3 secondary to fatigue   TE For aerobic priming and instruction in future gym based exercise program  Tilt 3 height 4 with UE and LE reciprocal movement   Modified to standing with UE on chairs PWR! Moves (up, rock, twist, step)  X 10 ea   NMR   Seated PWR! Moves  X 10 ea rock, twist, up, and step   NMR Difficulty with full ROM sidestepping to the left   Ambualtion with AW and SPC [place] with  Seated PWR! Flow  2.5#  AW 1 x 470 ft  1 x 320 feet, 1 x 225 feet  2 x 10 reps (10 ea interval) CGA NMR Improved sequencing with metronome today Used metronome app on phone for 50 BPM for optimization of cane sequencing with gait - AW donned for PWR! Moves in seated   Standing PWR! Moves  X 10 ea  CGA NMR PWR! Up works on postural strengthening and antigravity extension target muscles, PRW! Rock working on [employer] shifting, PRW! Twist targeting trunk rotation and PRW! Step targeting transition movements. PWR! Moves target bradykinesia, rigidity, and dyskinesia through targeted functional movements that address four core movement difficulties for people with Parkinson's disease.              Treatment Provided this session   Pt educated throughout session about proper posture and technique with exercises. Improved exercise technique, movement at target joints, use of target muscles after min to mod verbal, visual, tactile cues.  Note: Portions of this document were prepared using Dragon  voice recognition software and although reviewed may contain unintentional dictation errors in syntax, grammar, or spelling.     PT Education -     Education Details Pt educated throughout session about proper posture and technique with exercises. Improved exercise technique, movement at target joints, use of target muscles after min to mod verbal, visual, tactile cues.      Person(s) Educated Patient    Methods Explanation;Demonstration ; handout   Comprehension Verbalized understanding;Returned demonstration;Need further instruction              PT Short Term Goals -       PT SHORT TERM GOAL #1   Title Pt to report regular performance of exercises prescribed for home and a sense of improvements in strength and balance AEB them not being as challenging anymore.    Baseline Will issue on visit 2    Time 4    Period Weeks    Status met   Target Date 03/15/22      PT SHORT TERM GOAL #2   Title Pt to  demonstrate improved power AEB improved 5xSTS hands free in <18sec.    Baseline 03/01/22: 22sec hands free, author provideing foot block bilat 03/31/22: 14.82sec 4/23:17.3 sec   Time 4    Period Weeks    Status Met    Target Date 03/29/22               PT Long Term Goals - Target goal date for all remaining long term goals is 07/19/2022        PT LONG TERM GOAL #1   Title Pt to improve score on FOTO survey to 57 to indicate reduced difficulty with basic mobility required for ADL performance.    Baseline 03/01/22:49 04/29/22:56%   Time 8    Period Weeks    Status ONGOING          PT LONG TERM GOAL #2   Title Pt to improve tolerance to overground AMB c SPC AEB performance >887ft.    Baseline 2/27: fatigued after 444ft lap performed in four minutes ten seconds. 03/31/22: 765ft with SPC no rest break 04/26/22: 794 ft    Time 8    Period Weeks    Status Progressing/ ONGOING         PT LONG TERM GOAL #3   Title Pt to demonstrate improved leg power AEB 5xSTS from chair <14sec hands free and without need for minGuard assist for safety and without need for foot block.    Baseline 03/01/22: 22sec hands free, feet blocked, minGuard assist  03/31/22: 14.82sec  4/23:17.3 sec   Time 8     Period Weeks    Status ONGOING          PT LONG TERM GOAL #4   Title Pt to demonstrate improvement in balance performance AEB >6 improvement BERG Balance for balance assessment.    Baseline 41 on 03/03/22. 3/38/24: 42 4/23: 45   Time 8    Period Weeks    Status PROGRESSING / ONGOING          5.  Pt will improve distance with LRAD to 900 feet or greater in order to indicate further progress to community ambulation distances Baseline: 794 ft with SPC  Goal status: INITIAL    Plan     Clinical Impression Statement Patient presents with good motivation for completion of physical therapy activities. Patient continues to progress with lower extremity strengthening, endurance, and Parkinson's  disease  specific exercises and activities.Pt progressed with ambulatory distance and time today with continued good pattern when ambulating with cane and metronome. Pt will continue to benefit from skilled physical therapy intervention to address impairments, improve QOL, and attain therapy goals.      Personal Factors and Comorbidities Time since onset of injury/illness/exacerbation;Past/Current Experience    Examination-Activity Limitations Locomotion Level;Transfers;Bed Mobility;Reach Overhead;Hygiene/Grooming;Dressing;Toileting;Stairs;Bend    Stability/Clinical Decision Making Evolving/Moderate complexity    Clinical Decision Making Moderate    Rehab Potential Good    PT Frequency 2x / week    PT Duration 8 weeks    PT Treatment/Interventions ADLs/Self Care Home Management;Moist Heat;Gait training;Stair training;Functional mobility training;Therapeutic activities;Therapeutic exercise;Balance training;Neuromuscular re-education;Patient/family education    PT Next Visit Plan .  STS PWR! Up with eventual addition of PWR! Step. Work on weight shift cues with STS. Dynamic balance/gait training as indicated.    PT Home Exercise Plan 03/01/22: discussed finding and elevated sit surface for future STS exercises (something ~18-19 inches high)    Consulted and Agree with Plan of Care Patient             Patient will benefit from skilled therapeutic intervention in order to improve the following deficits and impairments:  balance, strength, coordination, gait, transfers. Posture. ROM   Visit Diagnosis: Muscle weakness (generalized)  Unsteadiness on feet  Difficulty in walking, not elsewhere classified  Other abnormalities of gait and mobility    Problem List Patient Active Problem List   Diagnosis Date Noted   Acute respiratory failure due to COVID-19 (HCC) 05/28/2019   Alcohol dependence with uncomplicated withdrawal (HCC) 01/23/2018   Alcohol withdrawal (HCC) 01/23/2018   GIB  (gastrointestinal bleeding) 12/09/2017   A-fib (HCC) 12/09/2017   Esophageal obstruction 10/10/2016   Esophageal ulceration 10/10/2016   GI bleed 09/20/2016   Low vitamin B12 level 03/18/2016   S/P kyphoplasty 03/18/2016   Basal cell carcinoma 02/08/2016   Atrial fibrillation (HCC) 01/27/2016   Colitis 01/27/2016   HLD (hyperlipidemia) 01/27/2016   Anxiety 01/27/2016   Hypokalemia 01/27/2016   Major depressive disorder, recurrent episode, moderate (HCC) 04/14/2015   Anxiety, generalized 12/06/2013   Parkinson disease 08/07/2013   Dysphagia 05/12/2013   Esophageal mass 05/12/2013   GERD (gastroesophageal reflux disease) 05/12/2013   Vomiting 05/12/2013   Open bite of lower leg 05/10/2013   Dog bite of lower leg 05/10/2013   Abnormal finding on liver function 03/25/2013   Benign neoplasm 03/25/2013   Dizziness 03/25/2013   Type 2 diabetes mellitus (HCC) 03/25/2013   BP (high blood pressure) 03/25/2013   Awareness of heartbeats 03/25/2013   Pure hypercholesterolemia 03/25/2013   Infective tonsillitis 03/25/2013   Adenomatous polyp 03/25/2013      Norman Herrlich, PT 06/07/2022, 1:15 PM  [facility] [address] [place], [place], [address] Phone: [phone]   Fax:  [phone]

## 2022-06-09 ENCOUNTER — Ambulatory Visit: Payer: Medicare Other | Admitting: Occupational Therapy

## 2022-06-09 ENCOUNTER — Ambulatory Visit: Payer: Medicare Other | Admitting: Physical Therapy

## 2022-06-09 ENCOUNTER — Encounter: Payer: Self-pay | Admitting: Physical Therapy

## 2022-06-09 DIAGNOSIS — M6281 Muscle weakness (generalized): Secondary | ICD-10-CM | POA: Diagnosis not present

## 2022-06-09 DIAGNOSIS — R2689 Other abnormalities of gait and mobility: Secondary | ICD-10-CM

## 2022-06-09 DIAGNOSIS — R278 Other lack of coordination: Secondary | ICD-10-CM

## 2022-06-09 DIAGNOSIS — R262 Difficulty in walking, not elsewhere classified: Secondary | ICD-10-CM

## 2022-06-09 DIAGNOSIS — R2681 Unsteadiness on feet: Secondary | ICD-10-CM

## 2022-06-09 NOTE — Therapy (Signed)
Outpatient Physical Therapy Treatment    Patient Details  Name: Charles Stanton MRN: [id number] Date of Birth: 1955-05-11 Referring Provider (PT): Dr. Cristopher Peru   Encounter Date: 06/09/2022  END OF SESSION:    PT End of Session - 06/09/22 1147     Visit Number 28    Number of Visits 40    Date for PT Re-Evaluation 07/19/22    Authorization Type Medicare A & B primary; federal employee secondary    Authorization Time Period -07/19/22    Progress Note Due on Visit 30    PT Start Time 1147    PT Stop Time 1228    PT Time Calculation (min) 41 min    Equipment Utilized During Treatment Gait belt    Activity Tolerance Patient tolerated treatment well;No increased pain    Behavior During Therapy [facility] for tasks assessed/performed                          Past Medical History:  Diagnosis Date   Abnormal liver function    Anemia    Anxiety    Atrial fibrillation (HCC)    Cancer (HCC) 2017   skin   Colonic mass    Depression    Dizziness    Dry cough    History of colon polyps    Hypercholesteremia    Hyperlipemia    Hypertension    Palpitations    Parkinson's disease    Parkinson's disease    PVD (peripheral vascular disease) (HCC)    Tonsillitis 03/25/2013   Tremors of nervous system     Past Surgical History:  Procedure Laterality Date   BACK SURGERY     Kyphoplasty   March 2018   COLONOSCOPY     COLONOSCOPY WITH PROPOFOL N/A 12/12/2017   Procedure: COLONOSCOPY WITH PROPOFOL;  Surgeon: Wyline Mood, MD;  Location: [facility] ENDOSCOPY;  Service: Gastroenterology;  Laterality: N/A;   ESOPHAGOGASTRODUODENOSCOPY N/A 12/11/2017   Procedure: ESOPHAGOGASTRODUODENOSCOPY (EGD);  Surgeon: Wyline Mood, MD;  Location: [facility] ENDOSCOPY;  Service: Gastroenterology;  Laterality: N/A;   ESOPHAGOGASTRODUODENOSCOPY N/A 10/08/2021   Procedure: ESOPHAGOGASTRODUODENOSCOPY (EGD);  Surgeon: Regis Bill, MD;  Location: [facility] ENDOSCOPY;  Service: Endoscopy;  Laterality:  N/A;   ESOPHAGOGASTRODUODENOSCOPY (EGD) WITH PROPOFOL N/A 09/21/2016   Procedure: ESOPHAGOGASTRODUODENOSCOPY (EGD) WITH PROPOFOL;  Surgeon: Wyline Mood, MD;  Location: [facility] ENDOSCOPY;  Service: Gastroenterology;  Laterality: N/A;   ESOPHAGOGASTRODUODENOSCOPY (EGD) WITH PROPOFOL N/A 11/07/2016   Procedure: ESOPHAGOGASTRODUODENOSCOPY (EGD) WITH PROPOFOL;  Surgeon: Wyline Mood, MD;  Location: [facility] ENDOSCOPY;  Service: Gastroenterology;  Laterality: N/A;   ESOPHAGOGASTRODUODENOSCOPY (EGD) WITH PROPOFOL N/A 10/15/2019   Procedure: ESOPHAGOGASTRODUODENOSCOPY (EGD) WITH PROPOFOL;  Surgeon: Regis Bill, MD;  Location: [facility] ENDOSCOPY;  Service: Endoscopy;  Laterality: N/A;   ESOPHAGOGASTRODUODENOSCOPY (EGD) WITH PROPOFOL N/A 10/28/2019   Procedure: ESOPHAGOGASTRODUODENOSCOPY (EGD) WITH PROPOFOL;  Surgeon: Regis Bill, MD;  Location: [facility] ENDOSCOPY;  Service: Endoscopy;  Laterality: N/A;   ESOPHAGOGASTRODUODENOSCOPY (EGD) WITH PROPOFOL N/A 12/02/2019   Procedure: ESOPHAGOGASTRODUODENOSCOPY (EGD) WITH PROPOFOL;  Surgeon: Regis Bill, MD;  Location: [facility] ENDOSCOPY;  Service: Endoscopy;  Laterality: N/A;   ESOPHAGOGASTRODUODENOSCOPY (EGD) WITH PROPOFOL N/A 02/18/2020   Procedure: ESOPHAGOGASTRODUODENOSCOPY (EGD) WITH PROPOFOL;  Surgeon: Regis Bill, MD;  Location: [facility] ENDOSCOPY;  Service: Endoscopy;  Laterality: N/A;   ESOPHAGOGASTRODUODENOSCOPY (EGD) WITH PROPOFOL N/A 08/02/2021   Procedure: ESOPHAGOGASTRODUODENOSCOPY (EGD) WITH PROPOFOL;  Surgeon: Regis Bill, MD;  Location: [facility] ENDOSCOPY;  Service: Endoscopy;  Laterality: N/A;  REQUEST AM   ESOPHAGOGASTRODUODENOSCOPY (EGD) WITH PROPOFOL N/A 10/05/2021   Procedure: ESOPHAGOGASTRODUODENOSCOPY (EGD) WITH PROPOFOL;  Surgeon: Regis Bill, MD;  Location: [facility] ENDOSCOPY;  Service: Endoscopy;  Laterality: N/A;   FRACTURE SURGERY Right 05/28/2019   distal radius fracture   HERNIA REPAIR     left inguinal hernia repair as  an infant   KYPHOPLASTY N/A 02/11/2016   Procedure: KYPHOPLASTY  L2,T9;  Surgeon: Kennedy Bucker, MD;  Location: [facility] ORS;  Service: Orthopedics;  Laterality: N/A;   KYPHOPLASTY N/A 03/15/2016   Procedure: KYPHOPLASTY L3;  Surgeon: Kennedy Bucker, MD;  Location: [facility] ORS;  Service: Orthopedics;  Laterality: N/A;   OPEN REDUCTION INTERNAL FIXATION (ORIF) DISTAL RADIAL FRACTURE Right 05/28/2019   Procedure: OPEN REDUCTION INTERNAL FIXATION (ORIF) DISTAL RADIAL FRACTURE;  Surgeon: Kennedy Bucker, MD;  Location: [facility] ORS;  Service: Orthopedics;  Laterality: Right;   skin cancer removed     TONSILLECTOMY       Subjective Assessment -      Subjective Pt reports doing well today. Pt denies any recent falls/stumbles since prior session. Pt denies any updates to medications or medical appointment since prior session. Pt reports good compliance with HEP when time permits.     Pertinent History Charles Stanton is a 66yoM who was prescribed with Parkinson's Disease ~ 10 years.  Pt is followed by neurology, reports good reponse to medications which help control his tremors somewhat. Pt uses a SPC for AMB, also uses a walker for longer distance walking. Pt goes into community almost daily (they like to eat out) which includes lots of SPC [place] eand navigation of 4 stairs at home.    Currently in Pain? No/denies             TREATMENT   Exercise/Activity Sets/Reps/Time/ Resistance Assistance Charge type Comments  Octane fitness machine (seated elyptical Level 2 x 6 min  Rest break x 2 min at minute 3 secondary to fatigue   TE For aerobic priming and instruction in future gym based exercise program  Tilt 3 height 4 with UE and LE reciprocal movement   Modified to standing with UE on chairs PWR! Moves (up, rock, twist, step)  X 10 ea   NMR   Seated PWR! Moves - modified step with added seated PWR! Up  X 10 ea rock, twist, up, and step progression  NMR Progression with step to improve transferred ability with care  transfers   Ambulation training outdoor walking with metronome for sequencing with cane  *15 min with intermittent rest when needed   TA   Standing PWR! Moves -PWR rock with heel raise, combo of step and twist PWR! Move  X 10 rock and 2 x 10 combo f twist and step  CGA NMR PWR! Up works on postural strengthening and antigravity extension target muscles, PRW! Rock working on [employer] shifting, PRW! Twist targeting trunk rotation and PRW! Step targeting transition movements. PWR! Moves target bradykinesia, rigidity, and dyskinesia through targeted functional movements that address four core movement difficulties for people with Parkinson's disease.              Treatment Provided this session   Pt educated throughout session about proper posture and technique with exercises. Improved exercise technique, movement at target joints, use of target muscles after min to mod verbal, visual, tactile cues.  Note: Portions of this document were prepared using Dragon voice recognition software and although reviewed may contain unintentional dictation errors in syntax,  grammar, or spelling.     PT Education -     Education Details Pt educated throughout session about proper posture and technique with exercises. Improved exercise technique, movement at target joints, use of target muscles after min to mod verbal, visual, tactile cues.      Person(s) Educated Patient    Methods Explanation;Demonstration ; handout   Comprehension Verbalized understanding;Returned demonstration;Need further instruction              PT Short Term Goals -       PT SHORT TERM GOAL #1   Title Pt to report regular performance of exercises prescribed for home and a sense of improvements in strength and balance AEB them not being as challenging anymore.    Baseline Will issue on visit 2    Time 4    Period Weeks    Status met   Target Date 03/15/22      PT SHORT TERM GOAL #2   Title Pt to demonstrate  improved power AEB improved 5xSTS hands free in <18sec.    Baseline 03/01/22: 22sec hands free, author provideing foot block bilat 03/31/22: 14.82sec 4/23:17.3 sec   Time 4    Period Weeks    Status Met    Target Date 03/29/22               PT Long Term Goals - Target goal date for all remaining long term goals is 07/19/2022        PT LONG TERM GOAL #1   Title Pt to improve score on FOTO survey to 57 to indicate reduced difficulty with basic mobility required for ADL performance.    Baseline 03/01/22:49 04/29/22:56%   Time 8    Period Weeks    Status ONGOING          PT LONG TERM GOAL #2   Title Pt to improve tolerance to overground AMB c SPC AEB performance >810ft.    Baseline 2/27: fatigued after 425ft lap performed in four minutes ten seconds. 03/31/22: 737ft with SPC no rest break 04/26/22: 794 ft    Time 8    Period Weeks    Status Progressing/ ONGOING         PT LONG TERM GOAL #3   Title Pt to demonstrate improved leg power AEB 5xSTS from chair <14sec hands free and without need for minGuard assist for safety and without need for foot block.    Baseline 03/01/22: 22sec hands free, feet blocked, minGuard assist  03/31/22: 14.82sec  4/23:17.3 sec   Time 8     Period Weeks    Status ONGOING          PT LONG TERM GOAL #4   Title Pt to demonstrate improvement in balance performance AEB >6 improvement BERG Balance for balance assessment.    Baseline 41 on 03/03/22. 3/38/24: 42 4/23: 45   Time 8    Period Weeks    Status PROGRESSING / ONGOING          5.  Pt will improve distance with LRAD to 900 feet or greater in order to indicate further progress to community ambulation distances Baseline: 794 ft with SPC  Goal status: INITIAL    Plan     Clinical Impression Statement Patient presents with good motivation for completion of physical therapy activities. Patient continues to progress with lower extremity strengthening, endurance, and Parkinson's disease  specific exercises and activities. Pt progresses to ambulation on outdoor surfaces across different  terrains with use of metronome. Pt progressed with ambulatory distance and time today with continued good pattern when ambulating with cane and metronome when on even surfaces. Pt will continue to benefit from skilled physical therapy intervention to address impairments, improve QOL, and attain therapy goals.      Personal Factors and Comorbidities Time since onset of injury/illness/exacerbation;Past/Current Experience    Examination-Activity Limitations Locomotion Level;Transfers;Bed Mobility;Reach Overhead;Hygiene/Grooming;Dressing;Toileting;Stairs;Bend    Stability/Clinical Decision Making Evolving/Moderate complexity    Clinical Decision Making Moderate    Rehab Potential Good    PT Frequency 2x / week    PT Duration 8 weeks    PT Treatment/Interventions ADLs/Self Care Home Management;Moist Heat;Gait training;Stair training;Functional mobility training;Therapeutic activities;Therapeutic exercise;Balance training;Neuromuscular re-education;Patient/family education    PT Next Visit Plan .  STS PWR! Up with eventual addition of PWR! Step. Work on weight shift cues with STS. Dynamic balance/gait training as indicated.    PT Home Exercise Plan 03/01/22: discussed finding and elevated sit surface for future STS exercises (something ~18-19 inches high)    Consulted and Agree with Plan of Care Patient             Patient will benefit from skilled therapeutic intervention in order to improve the following deficits and impairments:  balance, strength, coordination, gait, transfers. Posture. ROM   Visit Diagnosis: Unsteadiness on feet  Difficulty in walking, not elsewhere classified  Other abnormalities of gait and mobility  Muscle weakness (generalized)    Problem List Patient Active Problem List   Diagnosis Date Noted   Acute respiratory failure due to COVID-19 (HCC) 05/28/2019    Alcohol dependence with uncomplicated withdrawal (HCC) 01/23/2018   Alcohol withdrawal (HCC) 01/23/2018   GIB (gastrointestinal bleeding) 12/09/2017   A-fib (HCC) 12/09/2017   Esophageal obstruction 10/10/2016   Esophageal ulceration 10/10/2016   GI bleed 09/20/2016   Low vitamin B12 level 03/18/2016   S/P kyphoplasty 03/18/2016   Basal cell carcinoma 02/08/2016   Atrial fibrillation (HCC) 01/27/2016   Colitis 01/27/2016   HLD (hyperlipidemia) 01/27/2016   Anxiety 01/27/2016   Hypokalemia 01/27/2016   Major depressive disorder, recurrent episode, moderate (HCC) 04/14/2015   Anxiety, generalized 12/06/2013   Parkinson disease 08/07/2013   Dysphagia 05/12/2013   Esophageal mass 05/12/2013   GERD (gastroesophageal reflux disease) 05/12/2013   Vomiting 05/12/2013   Open bite of lower leg 05/10/2013   Dog bite of lower leg 05/10/2013   Abnormal finding on liver function 03/25/2013   Benign neoplasm 03/25/2013   Dizziness 03/25/2013   Type 2 diabetes mellitus (HCC) 03/25/2013   BP (high blood pressure) 03/25/2013   Awareness of heartbeats 03/25/2013   Pure hypercholesterolemia 03/25/2013   Infective tonsillitis 03/25/2013   Adenomatous polyp 03/25/2013      Norman Herrlich, PT 06/09/2022, 12:21 PM  [place] [facility] [address] [place], [place], [address] Phone: [phone]   Fax:  [phone]

## 2022-06-09 NOTE — Therapy (Signed)
OUTPATIENT OCCUPATIONAL THERAPY NEURO TREATMENT NOTE  Patient Name: Charles Stanton MRN: [id number] DOB:02-Sep-1955, 67 y.o., male Today's Date: 06/09/2022  PCP: Dr. Barbette Reichmann REFERRING PROVIDER: Dr. Cristopher Peru  END OF SESSION:  OT End of Session - 06/09/22 1431     Visit Number 4    Number of Visits 24    Date for OT Re-Evaluation 08/18/22    OT Start Time 1300    OT Stop Time 1345    OT Time Calculation (min) 45 min    Activity Tolerance Patient tolerated treatment well    Behavior During Therapy [facility] for tasks assessed/performed            Past Medical History:  Diagnosis Date   Abnormal liver function    Anemia    Anxiety    Atrial fibrillation (HCC)    Cancer (HCC) 2017   skin   Colonic mass    Depression    Dizziness    Dry cough    History of colon polyps    Hypercholesteremia    Hyperlipemia    Hypertension    Palpitations    Parkinson's disease    Parkinson's disease    PVD (peripheral vascular disease) (HCC)    Tonsillitis 03/25/2013   Tremors of nervous system    Past Surgical History:  Procedure Laterality Date   BACK SURGERY     Kyphoplasty   March 2018   COLONOSCOPY     COLONOSCOPY WITH PROPOFOL N/A 12/12/2017   Procedure: COLONOSCOPY WITH PROPOFOL;  Surgeon: Wyline Mood, MD;  Location: [facility] ENDOSCOPY;  Service: Gastroenterology;  Laterality: N/A;   ESOPHAGOGASTRODUODENOSCOPY N/A 12/11/2017   Procedure: ESOPHAGOGASTRODUODENOSCOPY (EGD);  Surgeon: Wyline Mood, MD;  Location: [facility] ENDOSCOPY;  Service: Gastroenterology;  Laterality: N/A;   ESOPHAGOGASTRODUODENOSCOPY N/A 10/08/2021   Procedure: ESOPHAGOGASTRODUODENOSCOPY (EGD);  Surgeon: Regis Bill, MD;  Location: [facility] ENDOSCOPY;  Service: Endoscopy;  Laterality: N/A;   ESOPHAGOGASTRODUODENOSCOPY (EGD) WITH PROPOFOL N/A 09/21/2016   Procedure: ESOPHAGOGASTRODUODENOSCOPY (EGD) WITH PROPOFOL;  Surgeon: Wyline Mood, MD;  Location: [facility] ENDOSCOPY;  Service: Gastroenterology;  Laterality:  N/A;   ESOPHAGOGASTRODUODENOSCOPY (EGD) WITH PROPOFOL N/A 11/07/2016   Procedure: ESOPHAGOGASTRODUODENOSCOPY (EGD) WITH PROPOFOL;  Surgeon: Wyline Mood, MD;  Location: [facility] ENDOSCOPY;  Service: Gastroenterology;  Laterality: N/A;   ESOPHAGOGASTRODUODENOSCOPY (EGD) WITH PROPOFOL N/A 10/15/2019   Procedure: ESOPHAGOGASTRODUODENOSCOPY (EGD) WITH PROPOFOL;  Surgeon: Regis Bill, MD;  Location: [facility] ENDOSCOPY;  Service: Endoscopy;  Laterality: N/A;   ESOPHAGOGASTRODUODENOSCOPY (EGD) WITH PROPOFOL N/A 10/28/2019   Procedure: ESOPHAGOGASTRODUODENOSCOPY (EGD) WITH PROPOFOL;  Surgeon: Regis Bill, MD;  Location: [facility] ENDOSCOPY;  Service: Endoscopy;  Laterality: N/A;   ESOPHAGOGASTRODUODENOSCOPY (EGD) WITH PROPOFOL N/A 12/02/2019   Procedure: ESOPHAGOGASTRODUODENOSCOPY (EGD) WITH PROPOFOL;  Surgeon: Regis Bill, MD;  Location: [facility] ENDOSCOPY;  Service: Endoscopy;  Laterality: N/A;   ESOPHAGOGASTRODUODENOSCOPY (EGD) WITH PROPOFOL N/A 02/18/2020   Procedure: ESOPHAGOGASTRODUODENOSCOPY (EGD) WITH PROPOFOL;  Surgeon: Regis Bill, MD;  Location: [facility] ENDOSCOPY;  Service: Endoscopy;  Laterality: N/A;   ESOPHAGOGASTRODUODENOSCOPY (EGD) WITH PROPOFOL N/A 08/02/2021   Procedure: ESOPHAGOGASTRODUODENOSCOPY (EGD) WITH PROPOFOL;  Surgeon: Regis Bill, MD;  Location: [facility] ENDOSCOPY;  Service: Endoscopy;  Laterality: N/A;  REQUEST AM   ESOPHAGOGASTRODUODENOSCOPY (EGD) WITH PROPOFOL N/A 10/05/2021   Procedure: ESOPHAGOGASTRODUODENOSCOPY (EGD) WITH PROPOFOL;  Surgeon: Regis Bill, MD;  Location: [facility] ENDOSCOPY;  Service: Endoscopy;  Laterality: N/A;   FRACTURE SURGERY Right 05/28/2019   distal radius fracture   HERNIA REPAIR     left inguinal hernia  repair as an infant   KYPHOPLASTY N/A 02/11/2016   Procedure: KYPHOPLASTY  L2,T9;  Surgeon: Kennedy Bucker, MD;  Location: [facility] ORS;  Service: Orthopedics;  Laterality: N/A;   KYPHOPLASTY N/A 03/15/2016   Procedure: KYPHOPLASTY L3;   Surgeon: Kennedy Bucker, MD;  Location: [facility] ORS;  Service: Orthopedics;  Laterality: N/A;   OPEN REDUCTION INTERNAL FIXATION (ORIF) DISTAL RADIAL FRACTURE Right 05/28/2019   Procedure: OPEN REDUCTION INTERNAL FIXATION (ORIF) DISTAL RADIAL FRACTURE;  Surgeon: Kennedy Bucker, MD;  Location: [facility] ORS;  Service: Orthopedics;  Laterality: Right;   skin cancer removed     TONSILLECTOMY     Patient Active Problem List   Diagnosis Date Noted   Acute respiratory failure due to COVID-19 (HCC) 05/28/2019   Alcohol dependence with uncomplicated withdrawal (HCC) 01/23/2018   Alcohol withdrawal (HCC) 01/23/2018   GIB (gastrointestinal bleeding) 12/09/2017   A-fib (HCC) 12/09/2017   Esophageal obstruction 10/10/2016   Esophageal ulceration 10/10/2016   GI bleed 09/20/2016   Low vitamin B12 level 03/18/2016   S/P kyphoplasty 03/18/2016   Basal cell carcinoma 02/08/2016   Atrial fibrillation (HCC) 01/27/2016   Colitis 01/27/2016   HLD (hyperlipidemia) 01/27/2016   Anxiety 01/27/2016   Hypokalemia 01/27/2016   Major depressive disorder, recurrent episode, moderate (HCC) 04/14/2015   Anxiety, generalized 12/06/2013   Parkinson disease 08/07/2013   Dysphagia 05/12/2013   Esophageal mass 05/12/2013   GERD (gastroesophageal reflux disease) 05/12/2013   Vomiting 05/12/2013   Open bite of lower leg 05/10/2013   Dog bite of lower leg 05/10/2013   Abnormal finding on liver function 03/25/2013   Benign neoplasm 03/25/2013   Dizziness 03/25/2013   Type 2 diabetes mellitus (HCC) 03/25/2013   BP (high blood pressure) 03/25/2013   Awareness of heartbeats 03/25/2013   Pure hypercholesterolemia 03/25/2013   Infective tonsillitis 03/25/2013   Adenomatous polyp 03/25/2013   ONSET DATE: 10 years   REFERRING DIAG: Parkinson's Disease  THERAPY DIAG:  Muscle weakness (generalized)  Other lack of coordination  Rationale for Evaluation and Treatment: Rehabilitation  SUBJECTIVE:  SUBJECTIVE STATEMENT: Pt  reports doing well today.   Pt accompanied by: self; spouse present in lobby, but did not follow for eval  PERTINENT HISTORY: Pt has been working with outpatient PT here at [facility] since end of Feb 2024.  OT ordered d/t worsening PD symptoms contributing to functional decline with ADLs.   PRECAUTIONS: Fall  WEIGHT BEARING RESTRICTIONS: No  PAIN:  Are you having pain? No  FALLS: Has patient fallen in last 6 months? Yes. Number of falls 1  LIVING ENVIRONMENT: Lives with: lives with their spouse Lives in: split level but pt remains on main level to avoid steps Stairs: Yes: Internal: 2 steps; can reach both Has following equipment at home: Quad cane small base, Walker - 4 wheeled, bed side commode, Grab bars, and pull down seat for shower, hand held shower hose,   PLOF: Needs assistance with ADLs; pt reports that spouse assists but is consistent to always let pt try things first before she automatically helps.    PATIENT GOALS: To be more indep  OBJECTIVE:  HAND DOMINANCE: Right (most affected side from PD)  ADLs: Overall ADLs: spouse is primary caregiver Transfers/ambulation related to ADLs: uses RW Eating: spouse cuts food; pt reports having to use L non-dominant hand to eat for the last 5-6 yrs Grooming: set up; assist to squeeze toothpaste and denture paste; uses L non-dominant hand UB Dressing: set up; assist with clothing fasteners LB Dressing: Min  A; assist with socks, dons slip on shoes with set up (has tried a sockaid but was unsuccessful) Toileting: modified indep Bathing: Set up/min A; spouse squeezes shampoo into pt's hands, spouse helps to towel dry  Tub Shower transfers: distant supv Equipment:  see above   IADLs: Shopping: pt can accompany spouse by pushing a shopping cart.  Light housekeeping: pt can drag trash to bin and can roll the bin to the road and back Meal Prep: spouse manages  Community mobility: spouse drives; uses rollator for longer distances   Medication management: spouse manages pills using weekly pill organizer.   Financial management: spouse manages  Handwriting: Mild micrographia and <25% legible  MOBILITY STATUS: Hx of falls  POSTURE COMMENTS:  rounded shoulders and forward head, R shoulder elevated  ACTIVITY TOLERANCE: Activity tolerance: TBD and assessed further with functional activities   FUNCTIONAL OUTCOME MEASURES: FOTO: TBD   UPPER EXTREMITY ROM:    Active ROM Right eval Left eval  Shoulder flexion 118 85  Shoulder abduction 128 124  Shoulder adduction    Shoulder extension    Shoulder internal rotation [facility] WFL  Shoulder external rotation [facility] WFL  Elbow flexion    Elbow extension    Wrist flexion 55 76  Wrist extension 57 75  Wrist ulnar deviation    Wrist radial deviation    Wrist pronation [facility] WFL  Wrist supination 63 75  (Blank rows = not tested)  UPPER EXTREMITY MMT:     MMT Right eval Left eval  Shoulder flexion 4 4  Shoulder abduction 4+ 4+  Shoulder adduction    Shoulder extension    Shoulder internal rotation    Shoulder external rotation    Middle trapezius    Lower trapezius    Elbow flexion 4+ 4+  Elbow extension 4+ 4+  Wrist flexion 4+ 4+  Wrist extension 4+ 4+  Wrist ulnar deviation    Wrist radial deviation    Wrist pronation    Wrist supination    (Blank rows = not tested)  HAND FUNCTION: Grip strength: Right: 16 lbs; Left: 38 lbs, Lateral pinch: Right: 10 lbs, Left: 14 lbs, and 3 point pinch: Right: 9 lbs, Left: 11 lbs  COORDINATION: 9 Hole Peg test: Right: 38 sec; Left: 40 sec  SENSATION: WFL  EDEMA: None  MUSCLE TONE: R/L normal  COGNITION: Overall cognitive status: Within functional limits for tasks assessed  VISION: Subjective report: wears glasses all the time    PRAXIS: Impaired: Motor planning  OBSERVATIONS:  R SF PIP flexion contracture 105* from 5-6 years ago from a fall  TODAY'S TREATMENT:                                                                                                                               DATE: 06/09/22  Self Care:  Pt. practiced handwriting skills using a standard pen with a larger pen handle. Pt. worked on Careers information officer, and visual  motor skills with writing. Pt. worked on Psychologist, sport and exercise rows of shapes, diagonal alternating lines, and Engineer, site. Pt. worked on writing his name, writing 4 and 5 letter words, and writing multiple sentences using both lined, and unlined paper. Pt. Formulated writing with 50-75% legibility.    PATIENT EDUCATION: Education details: Theraputty Person educated: Patient Education method: Explanation and Verbal cues, demo Education comprehension: verbalized understanding, demonstrated understanding; min vc for technique  HOME EXERCISE PROGRAM: Theraputty   GOALS: Goals reviewed with patient? Yes  SHORT TERM GOALS: Target date: 07/07/22 (6 weeks)  Pt will be indep to perform HEP for improving bilat hand strength and coordination skills. Baseline: Eval: not yet initiated Goal status: INITIAL  LONG TERM GOALS: Target date: 08/18/22 (12 weeks)  Pt will increase FOTO score to (TBD) or better to indicate improvement in self perceived functional use of the R arm with daily tasks. Baseline: Eval: TBD Goal status: INITIAL  2.  Pt will increase R grip strength by 5 or more lbs to more easily hold and stabilize ADL supplies in the R dominant hand. Baseline: R grip 16 lbs; limited from PD but also by 5th digit PIP flexion contracture, as this digit can not engage in gripping (L 38 lbs) Goal status: INITIAL  3.  Pt will improve R hand FMC/dexterity skills to be able to sign his name on medical/legal documents with 75% legibility, using adapted writing aids as needed. Baseline: Eval: <25% legible and pt verbalizes embarrassment when attempting to sign his name on forms Goal status: INITIAL  4.  Pt will increase R FMC/GMC skills to engage the RUE  into UB ADLs at least 50% of the time. Baseline: Eval: Pt uses the L non-dominant arm for self care tasks. Goal status: INITIAL  ASSESSMENT:  CLINICAL IMPRESSION:  Pt. used a standard pen with a slightly larger built-in cushion. Pt. was able to formulate list of words, and sentences requiring increased time. Pt. requires cues to utilize stabilization strategies for more distal control.  Pt. was able to formulate sentences without deviating below the line when using lined paper however, presented with significant deviation below the line. Pt. continues to benefit from skilled OT for increasing bilateral hand strength, improve coordination, specifically working to increase engagement of the RUE during self care tasks.    PERFORMANCE DEFICITS: in functional skills including ADLs, IADLs, coordination, dexterity, ROM, strength, flexibility, Fine motor control, Gross motor control, mobility, balance, body mechanics, endurance, decreased knowledge of use of DME, and UE functional use, cognitive skills including emotional and memory, and psychosocial skills including coping strategies, environmental adaptation, habits, and routines and behaviors.   IMPAIRMENTS: are limiting patient from ADLs, IADLs, and leisure.   CO-MORBIDITIES: has co-morbidities such as anxiety, depression  that affects occupational performance. Patient will benefit from skilled OT to address above impairments and improve overall function.  MODIFICATION OR ASSISTANCE TO COMPLETE EVALUATION: No modification of tasks or assist necessary to complete an evaluation.  OT OCCUPATIONAL PROFILE AND HISTORY: Problem focused assessment: Including review of records relating to presenting problem.  CLINICAL DECISION MAKING: Moderate - several treatment options, min-mod task modification necessary  REHAB POTENTIAL: Good for goals  EVALUATION COMPLEXITY: Moderate    PLAN:  OT FREQUENCY: 2x/week  OT DURATION: 12 weeks  PLANNED  INTERVENTIONS: self care/ADL training, therapeutic exercise, therapeutic activity, neuromuscular re-education, manual therapy, passive range of motion, balance training, functional mobility training, moist heat, cryotherapy, patient/family education, cognitive remediation/compensation, psychosocial skills training, energy conservation, coping strategies  training, and DME and/or AE instructions  RECOMMENDED OTHER SERVICES: None at this time  CONSULTED AND AGREED WITH PLAN OF CARE: Patient  PLAN FOR NEXT SESSION: initiate HEP, complete Darrel Hoover, MS, OTR/L   06/09/2022

## 2022-06-14 ENCOUNTER — Ambulatory Visit: Payer: Medicare Other | Admitting: Physical Therapy

## 2022-06-14 ENCOUNTER — Encounter: Payer: Self-pay | Admitting: Physical Therapy

## 2022-06-14 ENCOUNTER — Ambulatory Visit: Payer: Medicare Other

## 2022-06-14 DIAGNOSIS — M6281 Muscle weakness (generalized): Secondary | ICD-10-CM

## 2022-06-14 DIAGNOSIS — R278 Other lack of coordination: Secondary | ICD-10-CM

## 2022-06-14 DIAGNOSIS — R251 Tremor, unspecified: Secondary | ICD-10-CM

## 2022-06-14 DIAGNOSIS — R262 Difficulty in walking, not elsewhere classified: Secondary | ICD-10-CM

## 2022-06-14 DIAGNOSIS — R2681 Unsteadiness on feet: Secondary | ICD-10-CM

## 2022-06-14 NOTE — Therapy (Signed)
OUTPATIENT OCCUPATIONAL THERAPY NEURO TREATMENT NOTE  Patient Name: Charles Stanton MRN: [id number] DOB:1955-11-07, 67 y.o., male Today's Date: 06/14/2022  PCP: Dr. Barbette Reichmann REFERRING PROVIDER: Dr. Cristopher Peru  END OF SESSION:  OT End of Session - 06/14/22 1412     Visit Number 5    Number of Visits 24    Date for OT Re-Evaluation 08/18/22    Progress Note Due on Visit 10    OT Start Time 1345    OT Stop Time 1430    OT Time Calculation (min) 45 min    Equipment Utilized During Treatment RW    Activity Tolerance Patient tolerated treatment well    Behavior During Therapy [facility] for tasks assessed/performed            Past Medical History:  Diagnosis Date   Abnormal liver function    Anemia    Anxiety    Atrial fibrillation (HCC)    Cancer (HCC) 2017   skin   Colonic mass    Depression    Dizziness    Dry cough    History of colon polyps    Hypercholesteremia    Hyperlipemia    Hypertension    Palpitations    Parkinson's disease    Parkinson's disease    PVD (peripheral vascular disease) (HCC)    Tonsillitis 03/25/2013   Tremors of nervous system    Past Surgical History:  Procedure Laterality Date   BACK SURGERY     Kyphoplasty   March 2018   COLONOSCOPY     COLONOSCOPY WITH PROPOFOL N/A 12/12/2017   Procedure: COLONOSCOPY WITH PROPOFOL;  Surgeon: Wyline Mood, MD;  Location: [facility] ENDOSCOPY;  Service: Gastroenterology;  Laterality: N/A;   ESOPHAGOGASTRODUODENOSCOPY N/A 12/11/2017   Procedure: ESOPHAGOGASTRODUODENOSCOPY (EGD);  Surgeon: Wyline Mood, MD;  Location: [facility] ENDOSCOPY;  Service: Gastroenterology;  Laterality: N/A;   ESOPHAGOGASTRODUODENOSCOPY N/A 10/08/2021   Procedure: ESOPHAGOGASTRODUODENOSCOPY (EGD);  Surgeon: Regis Bill, MD;  Location: [facility] ENDOSCOPY;  Service: Endoscopy;  Laterality: N/A;   ESOPHAGOGASTRODUODENOSCOPY (EGD) WITH PROPOFOL N/A 09/21/2016   Procedure: ESOPHAGOGASTRODUODENOSCOPY (EGD) WITH PROPOFOL;  Surgeon: Wyline Mood, MD;  Location: [facility] ENDOSCOPY;  Service: Gastroenterology;  Laterality: N/A;   ESOPHAGOGASTRODUODENOSCOPY (EGD) WITH PROPOFOL N/A 11/07/2016   Procedure: ESOPHAGOGASTRODUODENOSCOPY (EGD) WITH PROPOFOL;  Surgeon: Wyline Mood, MD;  Location: [facility] ENDOSCOPY;  Service: Gastroenterology;  Laterality: N/A;   ESOPHAGOGASTRODUODENOSCOPY (EGD) WITH PROPOFOL N/A 10/15/2019   Procedure: ESOPHAGOGASTRODUODENOSCOPY (EGD) WITH PROPOFOL;  Surgeon: Regis Bill, MD;  Location: [facility] ENDOSCOPY;  Service: Endoscopy;  Laterality: N/A;   ESOPHAGOGASTRODUODENOSCOPY (EGD) WITH PROPOFOL N/A 10/28/2019   Procedure: ESOPHAGOGASTRODUODENOSCOPY (EGD) WITH PROPOFOL;  Surgeon: Regis Bill, MD;  Location: [facility] ENDOSCOPY;  Service: Endoscopy;  Laterality: N/A;   ESOPHAGOGASTRODUODENOSCOPY (EGD) WITH PROPOFOL N/A 12/02/2019   Procedure: ESOPHAGOGASTRODUODENOSCOPY (EGD) WITH PROPOFOL;  Surgeon: Regis Bill, MD;  Location: [facility] ENDOSCOPY;  Service: Endoscopy;  Laterality: N/A;   ESOPHAGOGASTRODUODENOSCOPY (EGD) WITH PROPOFOL N/A 02/18/2020   Procedure: ESOPHAGOGASTRODUODENOSCOPY (EGD) WITH PROPOFOL;  Surgeon: Regis Bill, MD;  Location: [facility] ENDOSCOPY;  Service: Endoscopy;  Laterality: N/A;   ESOPHAGOGASTRODUODENOSCOPY (EGD) WITH PROPOFOL N/A 08/02/2021   Procedure: ESOPHAGOGASTRODUODENOSCOPY (EGD) WITH PROPOFOL;  Surgeon: Regis Bill, MD;  Location: [facility] ENDOSCOPY;  Service: Endoscopy;  Laterality: N/A;  REQUEST AM   ESOPHAGOGASTRODUODENOSCOPY (EGD) WITH PROPOFOL N/A 10/05/2021   Procedure: ESOPHAGOGASTRODUODENOSCOPY (EGD) WITH PROPOFOL;  Surgeon: Regis Bill, MD;  Location: [facility] ENDOSCOPY;  Service: Endoscopy;  Laterality: N/A;   FRACTURE SURGERY Right  05/28/2019   distal radius fracture   HERNIA REPAIR     left inguinal hernia repair as an infant   KYPHOPLASTY N/A 02/11/2016   Procedure: KYPHOPLASTY  L2,T9;  Surgeon: Kennedy Bucker, MD;  Location: [facility] ORS;  Service: Orthopedics;   Laterality: N/A;   KYPHOPLASTY N/A 03/15/2016   Procedure: KYPHOPLASTY L3;  Surgeon: Kennedy Bucker, MD;  Location: [facility] ORS;  Service: Orthopedics;  Laterality: N/A;   OPEN REDUCTION INTERNAL FIXATION (ORIF) DISTAL RADIAL FRACTURE Right 05/28/2019   Procedure: OPEN REDUCTION INTERNAL FIXATION (ORIF) DISTAL RADIAL FRACTURE;  Surgeon: Kennedy Bucker, MD;  Location: [facility] ORS;  Service: Orthopedics;  Laterality: Right;   skin cancer removed     TONSILLECTOMY     Patient Active Problem List   Diagnosis Date Noted   Acute respiratory failure due to COVID-19 (HCC) 05/28/2019   Alcohol dependence with uncomplicated withdrawal (HCC) 01/23/2018   Alcohol withdrawal (HCC) 01/23/2018   GIB (gastrointestinal bleeding) 12/09/2017   A-fib (HCC) 12/09/2017   Esophageal obstruction 10/10/2016   Esophageal ulceration 10/10/2016   GI bleed 09/20/2016   Low vitamin B12 level 03/18/2016   S/P kyphoplasty 03/18/2016   Basal cell carcinoma 02/08/2016   Atrial fibrillation (HCC) 01/27/2016   Colitis 01/27/2016   HLD (hyperlipidemia) 01/27/2016   Anxiety 01/27/2016   Hypokalemia 01/27/2016   Major depressive disorder, recurrent episode, moderate (HCC) 04/14/2015   Anxiety, generalized 12/06/2013   Parkinson disease 08/07/2013   Dysphagia 05/12/2013   Esophageal mass 05/12/2013   GERD (gastroesophageal reflux disease) 05/12/2013   Vomiting 05/12/2013   Open bite of lower leg 05/10/2013   Dog bite of lower leg 05/10/2013   Abnormal finding on liver function 03/25/2013   Benign neoplasm 03/25/2013   Dizziness 03/25/2013   Type 2 diabetes mellitus (HCC) 03/25/2013   BP (high blood pressure) 03/25/2013   Awareness of heartbeats 03/25/2013   Pure hypercholesterolemia 03/25/2013   Infective tonsillitis 03/25/2013   Adenomatous polyp 03/25/2013   ONSET DATE: 10 years   REFERRING DIAG: Parkinson's Disease  THERAPY DIAG:  Muscle weakness (generalized)  Other lack of coordination  Tremor  Rationale  for Evaluation and Treatment: Rehabilitation  SUBJECTIVE:  SUBJECTIVE STATEMENT: Pt reports being a little tired from PT session prior to OT. Pt accompanied by: self; spouse present in lobby, but did not follow for eval  PERTINENT HISTORY: Pt has been working with outpatient PT here at [facility] since end of Feb 2024.  OT ordered d/t worsening PD symptoms contributing to functional decline with ADLs.   PRECAUTIONS: Fall  WEIGHT BEARING RESTRICTIONS: No  PAIN:  Are you having pain? No  FALLS: Has patient fallen in last 6 months? Yes. Number of falls 1  LIVING ENVIRONMENT: Lives with: lives with their spouse Lives in: split level but pt remains on main level to avoid steps Stairs: Yes: Internal: 2 steps; can reach both Has following equipment at home: Quad cane small base, Walker - 4 wheeled, bed side commode, Grab bars, and pull down seat for shower, hand held shower hose,   PLOF: Needs assistance with ADLs; pt reports that spouse assists but is consistent to always let pt try things first before she automatically helps.    PATIENT GOALS: To be more indep  OBJECTIVE:  HAND DOMINANCE: Right (most affected side from PD)  ADLs: Overall ADLs: spouse is primary caregiver Transfers/ambulation related to ADLs: uses RW Eating: spouse cuts food; pt reports having to use L non-dominant hand to eat for the last 5-6 yrs Grooming:  set up; assist to squeeze toothpaste and denture paste; uses L non-dominant hand UB Dressing: set up; assist with clothing fasteners LB Dressing: Min A; assist with socks, dons slip on shoes with set up (has tried a sockaid but was unsuccessful) Toileting: modified indep Bathing: Set up/min A; spouse squeezes shampoo into pt's hands, spouse helps to towel dry  Tub Shower transfers: distant supv Equipment:  see above   IADLs: Shopping: pt can accompany spouse by pushing a shopping cart.  Light housekeeping: pt can drag trash to bin and can roll the bin to the road  and back Meal Prep: spouse manages  Community mobility: spouse drives; uses rollator for longer distances  Medication management: spouse manages pills using weekly pill organizer.   Financial management: spouse manages  Handwriting: Mild micrographia and <25% legible  MOBILITY STATUS: Hx of falls  POSTURE COMMENTS:  rounded shoulders and forward head, R shoulder elevated  ACTIVITY TOLERANCE: Activity tolerance: TBD and assessed further with functional activities   FUNCTIONAL OUTCOME MEASURES: FOTO: TBD   UPPER EXTREMITY ROM:    Active ROM Right eval Left eval  Shoulder flexion 118 85  Shoulder abduction 128 124  Shoulder adduction    Shoulder extension    Shoulder internal rotation [facility] WFL  Shoulder external rotation [facility] WFL  Elbow flexion    Elbow extension    Wrist flexion 55 76  Wrist extension 57 75  Wrist ulnar deviation    Wrist radial deviation    Wrist pronation [facility] WFL  Wrist supination 63 75  (Blank rows = not tested)  UPPER EXTREMITY MMT:     MMT Right eval Left eval  Shoulder flexion 4 4  Shoulder abduction 4+ 4+  Shoulder adduction    Shoulder extension    Shoulder internal rotation    Shoulder external rotation    Middle trapezius    Lower trapezius    Elbow flexion 4+ 4+  Elbow extension 4+ 4+  Wrist flexion 4+ 4+  Wrist extension 4+ 4+  Wrist ulnar deviation    Wrist radial deviation    Wrist pronation    Wrist supination    (Blank rows = not tested)  HAND FUNCTION: Grip strength: Right: 16 lbs; Left: 38 lbs, Lateral pinch: Right: 10 lbs, Left: 14 lbs, and 3 point pinch: Right: 9 lbs, Left: 11 lbs  COORDINATION: 9 Hole Peg test: Right: 38 sec; Left: 40 sec  SENSATION: WFL  EDEMA: None  MUSCLE TONE: R/L normal  COGNITION: Overall cognitive status: Within functional limits for tasks assessed  VISION: Subjective report: wears glasses all the time    PRAXIS: Impaired: Motor planning  OBSERVATIONS:  R SF PIP flexion  contracture 105* from 5-6 years ago from a fall  TODAY'S TREATMENT:                                                                                                                              DATE: 06/14/22  Therapeutic Activity: Trialed 1/2# and 1# weight wrist weights to R wrist to assess effectiveness of weights to minimize RUE tremor during [facility] task.  Pt worked to place ball pegs into pegboard with R hand with wrist weights donned.  Therapeutic Exercise: Facilitated hand strengthening with use of hand gripper set at 6.6# on the R and 11.2# on the L# to remove jumbo pegs from pegboard x2 trials each hand.  Intermittent min vc and tactile cues for set up of hand gripper in hand to maximize closure with each gripping rep.  Facilitated pinch strengthening with use of therapy resistant clothespins to target 3 point pinch of R/L hands.  Pt able to manage all colors for 1 trial on each hand, requiring min vc and demo for pinch prehension pattern.  PATIENT EDUCATION: Education details: Potential benefit of wrist weight to minimize UE tremor Person educated: Patient Education method: Explanation and Verbal cues, demo Education comprehension: verbalized understanding, demonstrated understanding  HOME EXERCISE PROGRAM: Theraputty   GOALS: Goals reviewed with patient? Yes  SHORT TERM GOALS: Target date: 07/07/22 (6 weeks)  Pt will be indep to perform HEP for improving bilat hand strength and coordination skills. Baseline: Eval: not yet initiated Goal status: INITIAL  LONG TERM GOALS: Target date: 08/18/22 (12 weeks)  Pt will increase FOTO score to (TBD) or better to indicate improvement in self perceived functional use of the R arm with daily tasks. Baseline: Eval: TBD Goal status: INITIAL  2.  Pt will increase R grip strength by 5 or more lbs to more easily hold and stabilize ADL supplies in the R dominant hand. Baseline: R grip 16 lbs; limited from PD but also by 5th digit PIP flexion  contracture, as this digit can not engage in gripping (L 38 lbs) Goal status: INITIAL  3.  Pt will improve R hand FMC/dexterity skills to be able to sign his name on medical/legal documents with 75% legibility, using adapted writing aids as needed. Baseline: Eval: <25% legible and pt verbalizes embarrassment when attempting to sign his name on forms Goal status: INITIAL  4.  Pt will increase R FMC/GMC skills to engage the RUE into UB ADLs at least 50% of the time. Baseline: Eval: Pt uses the L non-dominant arm for self care tasks. Goal status: INITIAL  ASSESSMENT:  CLINICAL IMPRESSION: Pt participated in [facility] task for RUE with trial of 1/2# and 1# wrist weight to assess for benefit in minimizing tremor for better FM control.  Pt reported that he did not find much benefit from the weight.  Good tolerance to therapeutic exercises this date.  Pt tolerated 6.6# on the R and 11.2# on the L using hand gripper to remove jumbo pegs from pegboard.  Decreased on the R d/t R SF flexion contracture preventing this digit from engaging in gripping on this hand.  Pt required frequent rest breaks with gripping and pinching activities d/t weakness in the hands, and intermittent min tactile and vc for gripping and pinching prehension patterns.  Pt. continues to benefit from skilled OT for increasing bilateral hand strength, improve coordination, specifically working to increase engagement of the RUE during self care tasks.    PERFORMANCE DEFICITS: in functional skills including ADLs, IADLs, coordination, dexterity, ROM, strength, flexibility, Fine motor control, Gross motor control, mobility, balance, body mechanics, endurance, decreased knowledge of use of DME, and UE functional use, cognitive skills including emotional and memory, and psychosocial skills including coping strategies, environmental adaptation, habits, and routines and behaviors.   IMPAIRMENTS:  are limiting patient from ADLs, IADLs, and leisure.    CO-MORBIDITIES: has co-morbidities such as anxiety, depression  that affects occupational performance. Patient will benefit from skilled OT to address above impairments and improve overall function.  MODIFICATION OR ASSISTANCE TO COMPLETE EVALUATION: No modification of tasks or assist necessary to complete an evaluation.  OT OCCUPATIONAL PROFILE AND HISTORY: Problem focused assessment: Including review of records relating to presenting problem.  CLINICAL DECISION MAKING: Moderate - several treatment options, min-mod task modification necessary  REHAB POTENTIAL: Good for goals  EVALUATION COMPLEXITY: Moderate    PLAN:  OT FREQUENCY: 2x/week  OT DURATION: 12 weeks  PLANNED INTERVENTIONS: self care/ADL training, therapeutic exercise, therapeutic activity, neuromuscular re-education, manual therapy, passive range of motion, balance training, functional mobility training, moist heat, cryotherapy, patient/family education, cognitive remediation/compensation, psychosocial skills training, energy conservation, coping strategies training, and DME and/or AE instructions  RECOMMENDED OTHER SERVICES: None at this time  CONSULTED AND AGREED WITH PLAN OF CARE: Patient  PLAN FOR NEXT SESSION: initiate HEP, complete FOTO  Danelle Earthly, MS, OTR/L  06/14/2022

## 2022-06-14 NOTE — Therapy (Signed)
Outpatient Physical Therapy Treatment    Patient Details  Name: Charles Stanton MRN: [id number] Date of Birth: 23-Nov-1955 Referring Provider (PT): Dr. Cristopher Peru   Encounter Date: 06/14/2022  END OF SESSION:    PT End of Session - 06/14/22 1255     Visit Number 29    Number of Visits 40    Date for PT Re-Evaluation 07/19/22    Authorization Type Medicare A & B primary; federal employee secondary    Authorization Time Period -07/19/22    Progress Note Due on Visit 30    PT Start Time 1300    PT Stop Time 1344    PT Time Calculation (min) 44 min    Equipment Utilized During Treatment Gait belt    Activity Tolerance Patient tolerated treatment well;No increased pain    Behavior During Therapy [facility] for tasks assessed/performed                           Past Medical History:  Diagnosis Date   Abnormal liver function    Anemia    Anxiety    Atrial fibrillation (HCC)    Cancer (HCC) 2017   skin   Colonic mass    Depression    Dizziness    Dry cough    History of colon polyps    Hypercholesteremia    Hyperlipemia    Hypertension    Palpitations    Parkinson's disease    Parkinson's disease    PVD (peripheral vascular disease) (HCC)    Tonsillitis 03/25/2013   Tremors of nervous system     Past Surgical History:  Procedure Laterality Date   BACK SURGERY     Kyphoplasty   March 2018   COLONOSCOPY     COLONOSCOPY WITH PROPOFOL N/A 12/12/2017   Procedure: COLONOSCOPY WITH PROPOFOL;  Surgeon: Wyline Mood, MD;  Location: [facility] ENDOSCOPY;  Service: Gastroenterology;  Laterality: N/A;   ESOPHAGOGASTRODUODENOSCOPY N/A 12/11/2017   Procedure: ESOPHAGOGASTRODUODENOSCOPY (EGD);  Surgeon: Wyline Mood, MD;  Location: [facility] ENDOSCOPY;  Service: Gastroenterology;  Laterality: N/A;   ESOPHAGOGASTRODUODENOSCOPY N/A 10/08/2021   Procedure: ESOPHAGOGASTRODUODENOSCOPY (EGD);  Surgeon: Regis Bill, MD;  Location: [facility] ENDOSCOPY;  Service: Endoscopy;  Laterality:  N/A;   ESOPHAGOGASTRODUODENOSCOPY (EGD) WITH PROPOFOL N/A 09/21/2016   Procedure: ESOPHAGOGASTRODUODENOSCOPY (EGD) WITH PROPOFOL;  Surgeon: Wyline Mood, MD;  Location: [facility] ENDOSCOPY;  Service: Gastroenterology;  Laterality: N/A;   ESOPHAGOGASTRODUODENOSCOPY (EGD) WITH PROPOFOL N/A 11/07/2016   Procedure: ESOPHAGOGASTRODUODENOSCOPY (EGD) WITH PROPOFOL;  Surgeon: Wyline Mood, MD;  Location: [facility] ENDOSCOPY;  Service: Gastroenterology;  Laterality: N/A;   ESOPHAGOGASTRODUODENOSCOPY (EGD) WITH PROPOFOL N/A 10/15/2019   Procedure: ESOPHAGOGASTRODUODENOSCOPY (EGD) WITH PROPOFOL;  Surgeon: Regis Bill, MD;  Location: [facility] ENDOSCOPY;  Service: Endoscopy;  Laterality: N/A;   ESOPHAGOGASTRODUODENOSCOPY (EGD) WITH PROPOFOL N/A 10/28/2019   Procedure: ESOPHAGOGASTRODUODENOSCOPY (EGD) WITH PROPOFOL;  Surgeon: Regis Bill, MD;  Location: [facility] ENDOSCOPY;  Service: Endoscopy;  Laterality: N/A;   ESOPHAGOGASTRODUODENOSCOPY (EGD) WITH PROPOFOL N/A 12/02/2019   Procedure: ESOPHAGOGASTRODUODENOSCOPY (EGD) WITH PROPOFOL;  Surgeon: Regis Bill, MD;  Location: [facility] ENDOSCOPY;  Service: Endoscopy;  Laterality: N/A;   ESOPHAGOGASTRODUODENOSCOPY (EGD) WITH PROPOFOL N/A 02/18/2020   Procedure: ESOPHAGOGASTRODUODENOSCOPY (EGD) WITH PROPOFOL;  Surgeon: Regis Bill, MD;  Location: [facility] ENDOSCOPY;  Service: Endoscopy;  Laterality: N/A;   ESOPHAGOGASTRODUODENOSCOPY (EGD) WITH PROPOFOL N/A 08/02/2021   Procedure: ESOPHAGOGASTRODUODENOSCOPY (EGD) WITH PROPOFOL;  Surgeon: Regis Bill, MD;  Location: [facility] ENDOSCOPY;  Service: Endoscopy;  Laterality: N/A;  REQUEST AM   ESOPHAGOGASTRODUODENOSCOPY (EGD) WITH PROPOFOL N/A 10/05/2021   Procedure: ESOPHAGOGASTRODUODENOSCOPY (EGD) WITH PROPOFOL;  Surgeon: Regis Bill, MD;  Location: [facility] ENDOSCOPY;  Service: Endoscopy;  Laterality: N/A;   FRACTURE SURGERY Right 05/28/2019   distal radius fracture   HERNIA REPAIR     left inguinal hernia repair as  an infant   KYPHOPLASTY N/A 02/11/2016   Procedure: KYPHOPLASTY  L2,T9;  Surgeon: Kennedy Bucker, MD;  Location: [facility] ORS;  Service: Orthopedics;  Laterality: N/A;   KYPHOPLASTY N/A 03/15/2016   Procedure: KYPHOPLASTY L3;  Surgeon: Kennedy Bucker, MD;  Location: [facility] ORS;  Service: Orthopedics;  Laterality: N/A;   OPEN REDUCTION INTERNAL FIXATION (ORIF) DISTAL RADIAL FRACTURE Right 05/28/2019   Procedure: OPEN REDUCTION INTERNAL FIXATION (ORIF) DISTAL RADIAL FRACTURE;  Surgeon: Kennedy Bucker, MD;  Location: [facility] ORS;  Service: Orthopedics;  Laterality: Right;   skin cancer removed     TONSILLECTOMY       Subjective Assessment -      Subjective Pt reports doing well today. Pt denies any recent falls/stumbles since prior session. Pt denies any updates to medications or medical appointment since prior session. Pt reports good compliance with HEP when time permits.     Pertinent History Charles Stanton is a 66yoM who was prescribed with Parkinson's Disease ~ 10 years.  Pt is followed by neurology, reports good reponse to medications which help control his tremors somewhat. Pt uses a SPC for AMB, also uses a walker for longer distance walking. Pt goes into community almost daily (they like to eat out) which includes lots of SPC [place] eand navigation of 4 stairs at home.    Currently in Pain? No/denies             TREATMENT Unless otherwise stated, CGA was provided and gait belt donned in order to ensure pt safety   Exercise/Activity Sets/Reps/Time/ Resistance Assistance Charge type Comments  Octane fitness machine (seated elyptical) Level 2 x 5 min  Rest break at minute 3 secondary to fatigue  TE For aerobic priming and instruction in future gym based exercise program  Tilt 3 height 4 with UE and LE reciprocal movement  Modified to standing with UE on chairs PWR! Move step. Standing PWR X 10 ea   NMR   Seated PWR! Moves - modified step with added seated PWR! Up  X 10 ea rock, up, and step progression   NMR Progression with step to improve transferred ability with car transfers   Ambulation training outdoor walking with metronome for sequencing with cane  ~23 min with intermittent rest when needed   TA Overground on differing surfaces and levels: incline, decline, grass, etc. Flat ground amb performed with metronome at 50 bpm, pt improving with consistency staying on beat, especially around curves and corners.  Standing PWR! Moves -PWR rock with heel raise, and twist  X 10 rock and x 10 twist  CGA NMR PWR! Up works on postural strengthening and antigravity extension target muscles, PRW! Rock working on [employer] shifting, PRW! Twist targeting trunk rotation and PRW! Step targeting transition movements. PWR! Moves target bradykinesia, rigidity, and dyskinesia through targeted functional movements that address four core movement difficulties for people with Parkinson's disease.              Treatment Provided this session   Pt educated throughout session about proper posture and technique with exercises. Improved exercise technique, movement at target joints, use of target muscles after min to  mod verbal, visual, tactile cues.  Note: Portions of this document were prepared using Dragon voice recognition software and although reviewed may contain unintentional dictation errors in syntax, grammar, or spelling.     PT Education -     Education Details Pt educated throughout session about proper posture and technique with exercises. Improved exercise technique, movement at target joints, use of target muscles after min to mod verbal, visual, tactile cues.      Person(s) Educated Patient    Methods Explanation;Demonstration ; handout   Comprehension Verbalized understanding;Returned demonstration;Need further instruction              PT Short Term Goals -       PT SHORT TERM GOAL #1   Title Pt to report regular performance of exercises prescribed for home and a sense of  improvements in strength and balance AEB them not being as challenging anymore.    Baseline Will issue on visit 2    Time 4    Period Weeks    Status met   Target Date 03/15/22      PT SHORT TERM GOAL #2   Title Pt to demonstrate improved power AEB improved 5xSTS hands free in <18sec.    Baseline 03/01/22: 22sec hands free, author provideing foot block bilat 03/31/22: 14.82sec 4/23:17.3 sec   Time 4    Period Weeks    Status Met    Target Date 03/29/22               PT Long Term Goals - Target goal date for all remaining long term goals is 07/19/2022        PT LONG TERM GOAL #1   Title Pt to improve score on FOTO survey to 57 to indicate reduced difficulty with basic mobility required for ADL performance.    Baseline 03/01/22:49 04/29/22:56%   Time 8    Period Weeks    Status ONGOING          PT LONG TERM GOAL #2   Title Pt to improve tolerance to overground AMB c SPC AEB performance >898ft.    Baseline 2/27: fatigued after 450ft lap performed in four minutes ten seconds. 03/31/22: 712ft with SPC no rest break 04/26/22: 794 ft    Time 8    Period Weeks    Status Progressing/ ONGOING         PT LONG TERM GOAL #3   Title Pt to demonstrate improved leg power AEB 5xSTS from chair <14sec hands free and without need for minGuard assist for safety and without need for foot block.    Baseline 03/01/22: 22sec hands free, feet blocked, minGuard assist  03/31/22: 14.82sec  4/23:17.3 sec   Time 8     Period Weeks    Status ONGOING          PT LONG TERM GOAL #4   Title Pt to demonstrate improvement in balance performance AEB >6 improvement BERG Balance for balance assessment.    Baseline 41 on 03/03/22. 3/38/24: 42 4/23: 45   Time 8    Period Weeks    Status PROGRESSING / ONGOING          5.  Pt will improve distance with LRAD to 900 feet or greater in order to indicate further progress to community ambulation distances Baseline: 794 ft with SPC  Goal status:  INITIAL    Plan     Clinical Impression Statement Patient presents with good motivation for completion of  physical therapy activities. Continued to work outdoor ambulation and [employer]! Progressions. Outdoor ambulation improving in duration, endurance and gait quality evidenced by decreases in rest breaks and increases in metronome-tap consistency. Pt continues to show general improvements in gait quality, therapist noted patients increased ability to stay on beat with metronome when navigating curves and corners. Pt will continue to benefit from skilled physical therapy intervention to address impairments, improve QOL, and attain therapy goals.      Personal Factors and Comorbidities Time since onset of injury/illness/exacerbation;Past/Current Experience    Examination-Activity Limitations Locomotion Level;Transfers;Bed Mobility;Reach Overhead;Hygiene/Grooming;Dressing;Toileting;Stairs;Bend    Stability/Clinical Decision Making Evolving/Moderate complexity    Clinical Decision Making Moderate    Rehab Potential Good    PT Frequency 2x / week    PT Duration 8 weeks    PT Treatment/Interventions ADLs/Self Care Home Management;Moist Heat;Gait training;Stair training;Functional mobility training;Therapeutic activities;Therapeutic exercise;Balance training;Neuromuscular re-education;Patient/family education    PT Next Visit Plan .  STS PWR! Up with eventual addition of PWR! Step. Work on weight shift cues with STS. Dynamic balance/gait training as indicated.    PT Home Exercise Plan 03/01/22: discussed finding and elevated sit surface for future STS exercises (something ~18-19 inches high)    Consulted and Agree with Plan of Care Patient             Patient will benefit from skilled therapeutic intervention in order to improve the following deficits and impairments:  balance, strength, coordination, gait, transfers. Posture. ROM   Visit Diagnosis: Muscle weakness (generalized)  Other lack of  coordination  Unsteadiness on feet  Difficulty in walking, not elsewhere classified    Problem List Patient Active Problem List   Diagnosis Date Noted   Acute respiratory failure due to COVID-19 (HCC) 05/28/2019   Alcohol dependence with uncomplicated withdrawal (HCC) 01/23/2018   Alcohol withdrawal (HCC) 01/23/2018   GIB (gastrointestinal bleeding) 12/09/2017   A-fib (HCC) 12/09/2017   Esophageal obstruction 10/10/2016   Esophageal ulceration 10/10/2016   GI bleed 09/20/2016   Low vitamin B12 level 03/18/2016   S/P kyphoplasty 03/18/2016   Basal cell carcinoma 02/08/2016   Atrial fibrillation (HCC) 01/27/2016   Colitis 01/27/2016   HLD (hyperlipidemia) 01/27/2016   Anxiety 01/27/2016   Hypokalemia 01/27/2016   Major depressive disorder, recurrent episode, moderate (HCC) 04/14/2015   Anxiety, generalized 12/06/2013   Parkinson disease 08/07/2013   Dysphagia 05/12/2013   Esophageal mass 05/12/2013   GERD (gastroesophageal reflux disease) 05/12/2013   Vomiting 05/12/2013   Open bite of lower leg 05/10/2013   Dog bite of lower leg 05/10/2013   Abnormal finding on liver function 03/25/2013   Benign neoplasm 03/25/2013   Dizziness 03/25/2013   Type 2 diabetes mellitus (HCC) 03/25/2013   BP (high blood pressure) 03/25/2013   Awareness of heartbeats 03/25/2013   Pure hypercholesterolemia 03/25/2013   Infective tonsillitis 03/25/2013   Adenomatous polyp 03/25/2013    This entire session was performed under direct supervision and direction of a licensed therapist/therapist assistant . I have personally read, edited and approve of the note as written.    This licensed clinician was present and actively directing care throughout the session at all times.  Norman Herrlich PT ,DPT Physical Therapist- [facility]    [place], [place] 06/14/2022, 2:07 PM  [facility] [address] [place], [place], [address] Phone: [phone]   Fax:  [phone]

## 2022-06-16 ENCOUNTER — Ambulatory Visit: Payer: Medicare Other | Admitting: Physical Therapy

## 2022-06-16 ENCOUNTER — Encounter: Payer: Self-pay | Admitting: Physical Therapy

## 2022-06-16 DIAGNOSIS — R2681 Unsteadiness on feet: Secondary | ICD-10-CM

## 2022-06-16 DIAGNOSIS — R251 Tremor, unspecified: Secondary | ICD-10-CM

## 2022-06-16 DIAGNOSIS — R278 Other lack of coordination: Secondary | ICD-10-CM

## 2022-06-16 DIAGNOSIS — M6281 Muscle weakness (generalized): Secondary | ICD-10-CM

## 2022-06-16 NOTE — Therapy (Signed)
Outpatient Physical Therapy Treatment/ Progress Note    Dates of Reporting Period: 05/05/2022 to 06/16/2022 Patient Details  Name: Charles Stanton MRN: [id number] Date of Birth: February 05, 1955 Referring Provider (PT): Dr. Cristopher Peru   Encounter Date: 06/16/2022  END OF SESSION:    PT End of Session - 06/16/22 1437     Visit Number 30    Number of Visits 40    Date for PT Re-Evaluation 07/19/22    Authorization Type Medicare A & B primary; federal employee secondary    Authorization Time Period -07/19/22    Progress Note Due on Visit 30    PT Start Time 1257    PT Stop Time 1343    PT Time Calculation (min) 46 min    Equipment Utilized During Treatment Gait belt    Activity Tolerance Patient tolerated treatment well;No increased pain    Behavior During Therapy [facility] for tasks assessed/performed                            Past Medical History:  Diagnosis Date   Abnormal liver function    Anemia    Anxiety    Atrial fibrillation (HCC)    Cancer (HCC) 2017   skin   Colonic mass    Depression    Dizziness    Dry cough    History of colon polyps    Hypercholesteremia    Hyperlipemia    Hypertension    Palpitations    Parkinson's disease    Parkinson's disease    PVD (peripheral vascular disease) (HCC)    Tonsillitis 03/25/2013   Tremors of nervous system     Past Surgical History:  Procedure Laterality Date   BACK SURGERY     Kyphoplasty   March 2018   COLONOSCOPY     COLONOSCOPY WITH PROPOFOL N/A 12/12/2017   Procedure: COLONOSCOPY WITH PROPOFOL;  Surgeon: Wyline Mood, MD;  Location: [facility] ENDOSCOPY;  Service: Gastroenterology;  Laterality: N/A;   ESOPHAGOGASTRODUODENOSCOPY N/A 12/11/2017   Procedure: ESOPHAGOGASTRODUODENOSCOPY (EGD);  Surgeon: Wyline Mood, MD;  Location: [facility] ENDOSCOPY;  Service: Gastroenterology;  Laterality: N/A;   ESOPHAGOGASTRODUODENOSCOPY N/A 10/08/2021   Procedure: ESOPHAGOGASTRODUODENOSCOPY (EGD);  Surgeon: Regis Bill, MD;  Location: [facility] ENDOSCOPY;  Service: Endoscopy;  Laterality: N/A;   ESOPHAGOGASTRODUODENOSCOPY (EGD) WITH PROPOFOL N/A 09/21/2016   Procedure: ESOPHAGOGASTRODUODENOSCOPY (EGD) WITH PROPOFOL;  Surgeon: Wyline Mood, MD;  Location: [facility] ENDOSCOPY;  Service: Gastroenterology;  Laterality: N/A;   ESOPHAGOGASTRODUODENOSCOPY (EGD) WITH PROPOFOL N/A 11/07/2016   Procedure: ESOPHAGOGASTRODUODENOSCOPY (EGD) WITH PROPOFOL;  Surgeon: Wyline Mood, MD;  Location: [facility] ENDOSCOPY;  Service: Gastroenterology;  Laterality: N/A;   ESOPHAGOGASTRODUODENOSCOPY (EGD) WITH PROPOFOL N/A 10/15/2019   Procedure: ESOPHAGOGASTRODUODENOSCOPY (EGD) WITH PROPOFOL;  Surgeon: Regis Bill, MD;  Location: [facility] ENDOSCOPY;  Service: Endoscopy;  Laterality: N/A;   ESOPHAGOGASTRODUODENOSCOPY (EGD) WITH PROPOFOL N/A 10/28/2019   Procedure: ESOPHAGOGASTRODUODENOSCOPY (EGD) WITH PROPOFOL;  Surgeon: Regis Bill, MD;  Location: [facility] ENDOSCOPY;  Service: Endoscopy;  Laterality: N/A;   ESOPHAGOGASTRODUODENOSCOPY (EGD) WITH PROPOFOL N/A 12/02/2019   Procedure: ESOPHAGOGASTRODUODENOSCOPY (EGD) WITH PROPOFOL;  Surgeon: Regis Bill, MD;  Location: [facility] ENDOSCOPY;  Service: Endoscopy;  Laterality: N/A;   ESOPHAGOGASTRODUODENOSCOPY (EGD) WITH PROPOFOL N/A 02/18/2020   Procedure: ESOPHAGOGASTRODUODENOSCOPY (EGD) WITH PROPOFOL;  Surgeon: Regis Bill, MD;  Location: [facility] ENDOSCOPY;  Service: Endoscopy;  Laterality: N/A;   ESOPHAGOGASTRODUODENOSCOPY (EGD) WITH PROPOFOL N/A 08/02/2021   Procedure: ESOPHAGOGASTRODUODENOSCOPY (EGD) WITH PROPOFOL;  Surgeon: Mia Creek,  Rossie Muskrat, MD;  Location: [facility] ENDOSCOPY;  Service: Endoscopy;  Laterality: N/A;  REQUEST AM   ESOPHAGOGASTRODUODENOSCOPY (EGD) WITH PROPOFOL N/A 10/05/2021   Procedure: ESOPHAGOGASTRODUODENOSCOPY (EGD) WITH PROPOFOL;  Surgeon: Regis Bill, MD;  Location: [facility] ENDOSCOPY;  Service: Endoscopy;  Laterality: N/A;   FRACTURE SURGERY Right 05/28/2019   distal  radius fracture   HERNIA REPAIR     left inguinal hernia repair as an infant   KYPHOPLASTY N/A 02/11/2016   Procedure: KYPHOPLASTY  L2,T9;  Surgeon: Kennedy Bucker, MD;  Location: [facility] ORS;  Service: Orthopedics;  Laterality: N/A;   KYPHOPLASTY N/A 03/15/2016   Procedure: KYPHOPLASTY L3;  Surgeon: Kennedy Bucker, MD;  Location: [facility] ORS;  Service: Orthopedics;  Laterality: N/A;   OPEN REDUCTION INTERNAL FIXATION (ORIF) DISTAL RADIAL FRACTURE Right 05/28/2019   Procedure: OPEN REDUCTION INTERNAL FIXATION (ORIF) DISTAL RADIAL FRACTURE;  Surgeon: Kennedy Bucker, MD;  Location: [facility] ORS;  Service: Orthopedics;  Laterality: Right;   skin cancer removed     TONSILLECTOMY       Subjective Assessment -      Subjective Pt reports doing well today. Pt denies any recent falls/stumbles since prior session. Pt denies any updates to medications or medical appointment since prior session. Pt reports good compliance with HEP when time permits.     Pertinent History Charles Stanton is a 66yoM who was prescribed with Parkinson's Disease ~ 10 years.  Pt is followed by neurology, reports good reponse to medications which help control his tremors somewhat. Pt uses a SPC for AMB, also uses a walker for longer distance walking. Pt goes into community almost daily (they like to eat out) which includes lots of SPC [place] eand navigation of 4 stairs at home.    Currently in Pain? No/denies             TREATMENT Unless otherwise stated, CGA was provided and gait belt donned in order to ensure pt safety   Progress note preformed today, see results per goals below, and in Goals section for comparison.  : 903' with SPC FOTO: 60 percent 5xSTS: 13.87 sec  BERG: 48  Additional treatment exercises can be found below  Exercise/Activity Sets/Reps/Time/ Resistance Assistance Charge type Comments  Standing PWR! Moves: Twist, Step and Reach combo. X 10 ea  CGA NMR Noted Minor unsteadiness with this exercise, likely due to  fatigue secondary to  Seated PWR! Moves: Up, Rock, Twist, Step.  10 each  NMR Combo of  PWR! Up and Rock. Cues focused on increasing trunk and chest rotation. PWR! Up works on postural strengthening and antigravity extension, PRW! Rock working on [employer] shifting, PRW! Twist targeting trunk rotation and PRW! Step targeting transition movements. PWR! Moves target bradykinesia, rigidity, and dyskinesia through targeted functional movements that address four core movement difficulties for people with Parkinson's disease.                           Treatment Provided this session   Pt educated throughout session about proper posture and technique with exercises. Improved exercise technique, movement at target joints, use of target muscles after min to mod verbal, visual, tactile cues.  Note: Portions of this document were prepared using Dragon voice recognition software and although reviewed may contain unintentional dictation errors in syntax, grammar, or spelling.     PT Education -     Education Details Pt educated throughout session about proper posture and technique with exercises. Improved exercise  technique, movement at target joints, use of target muscles after min to mod verbal, visual, tactile cues.      Person(s) Educated Patient    Methods Explanation;Demonstration ; handout   Comprehension Verbalized understanding;Returned demonstration;Need further instruction              PT Short Term Goals -       PT SHORT TERM GOAL #1   Title Pt to report regular performance of exercises prescribed for home and a sense of improvements in strength and balance AEB them not being as challenging anymore.    Baseline Will issue on visit 2    Time 4    Period Weeks    Status met   Target Date 03/15/22      PT SHORT TERM GOAL #2   Title Pt to demonstrate improved power AEB improved 5xSTS hands free in <18sec.    Baseline 03/01/22: 22sec hands free, author provideing foot  block bilat 03/31/22: 14.82sec 4/23:17.3 sec   Time 4    Period Weeks    Status Met    Target Date 03/29/22               PT Long Term Goals - Target goal date for all remaining long term goals is 07/19/2022        PT LONG TERM GOAL #1   Title Pt to improve score on FOTO survey to 57 to indicate reduced difficulty with basic mobility required for ADL performance.    Baseline 03/01/22:49 04/29/22:56%. 06/16/22: 60   Time 8    Period Weeks    Status Met          PT LONG TERM GOAL #2   Title Pt to improve tolerance to overground AMB c SPC AEB performance >870ft.    Baseline 2/27: fatigued after 463ft lap performed in four minutes ten seconds. 03/31/22: 776ft with SPC no rest break 04/26/22: 794 ft 06/16/22: 903 ft with SPC, no rest.   Time 8    Period Weeks    Status Met         PT LONG TERM GOAL #3   Title Pt to demonstrate improved leg power AEB 5xSTS from chair <14sec hands free and without need for minGuard assist for safety and without need for foot block.    Baseline 03/01/22: 22sec hands free, feet blocked, minGuard assist  03/31/22: 14.82sec  4/23:17.3 sec 06/16/22: 13.87 sec   Time 8     Period Weeks    Status MET/ONGOING         PT LONG TERM GOAL #4   Title Pt to demonstrate improvement in balance performance AEB >6 improvement BERG Balance for balance assessment.    Baseline 41 on 03/03/22. 3/38/24: 42 4/23: 45 06/16/22: 48   Time 8    Period Weeks    Status Met          5.  Pt will improve distance with LRAD to 900 feet or greater in order to indicate further progress to community ambulation distances Baseline: 794 ft with SPC , 06/16/22: 903 ft with SPC. Goal status: Met/ongoing (continue to assess to ensure maintenance of progress)    Plan     Clinical Impression Statement Physical therapy treatment session today consisted of completing assessment of goals and administration of testing as demonstrated and documented in flow sheet, treatment, and goals  section of this note. Goals assessment followed with Seated and standing PWR! Moves. Pt has met all goals  as of 6/13, though both and 5xSTS goals have been met by a small margin, we will continue to follow up with this at next goal assessment to ensure he maintains progress. Also with current and BERG scores pt is still at risk for falls and not ambulating at community ambulation level. Pt displaying increase gait mechanics today with SPC during the as well. New balance tests to be completed next session secondary to pt meeting goals and new goals will be established based on test results to further track progress. Based on narrow margins of goal completion, would continue to benefit from skilled physical therapy intervention to address impairments, improve QOL, and improve upon therapy goals. Patient's condition has the potential to improve in response to therapy. Maximum improvement is yet to be obtained. The anticipated improvement is attainable and reasonable in a generally predictable time.        Personal Factors and Comorbidities Time since onset of injury/illness/exacerbation;Past/Current Experience    Examination-Activity Limitations Locomotion Level;Transfers;Bed Mobility;Reach Overhead;Hygiene/Grooming;Dressing;Toileting;Stairs;Bend    Stability/Clinical Decision Making Evolving/Moderate complexity    Clinical Decision Making Moderate    Rehab Potential Good    PT Frequency 2x / week    PT Duration 8 weeks    PT Treatment/Interventions ADLs/Self Care Home Management;Moist Heat;Gait training;Stair training;Functional mobility training;Therapeutic activities;Therapeutic exercise;Balance training;Neuromuscular re-education;Patient/family education    PT Next Visit Plan .  STS PWR! Up with eventual addition of PWR! Step. Work on weight shift cues with STS. Dynamic balance/gait training as indicated.    PT Home Exercise Plan 03/01/22: discussed finding and elevated sit surface for  future STS exercises (something ~18-19 inches high)    Consulted and Agree with Plan of Care Patient             Patient will benefit from skilled therapeutic intervention in order to improve the following deficits and impairments:  balance, strength, coordination, gait, transfers. Posture. ROM   Visit Diagnosis: Muscle weakness (generalized)  Other lack of coordination  Tremor  Unsteadiness on feet    Problem List Patient Active Problem List   Diagnosis Date Noted   Acute respiratory failure due to COVID-19 (HCC) 05/28/2019   Alcohol dependence with uncomplicated withdrawal (HCC) 01/23/2018   Alcohol withdrawal (HCC) 01/23/2018   GIB (gastrointestinal bleeding) 12/09/2017   A-fib (HCC) 12/09/2017   Esophageal obstruction 10/10/2016   Esophageal ulceration 10/10/2016   GI bleed 09/20/2016   Low vitamin B12 level 03/18/2016   S/P kyphoplasty 03/18/2016   Basal cell carcinoma 02/08/2016   Atrial fibrillation (HCC) 01/27/2016   Colitis 01/27/2016   HLD (hyperlipidemia) 01/27/2016   Anxiety 01/27/2016   Hypokalemia 01/27/2016   Major depressive disorder, recurrent episode, moderate (HCC) 04/14/2015   Anxiety, generalized 12/06/2013   Parkinson disease 08/07/2013   Dysphagia 05/12/2013   Esophageal mass 05/12/2013   GERD (gastroesophageal reflux disease) 05/12/2013   Vomiting 05/12/2013   Open bite of lower leg 05/10/2013   Dog bite of lower leg 05/10/2013   Abnormal finding on liver function 03/25/2013   Benign neoplasm 03/25/2013   Dizziness 03/25/2013   Type 2 diabetes mellitus (HCC) 03/25/2013   BP (high blood pressure) 03/25/2013   Awareness of heartbeats 03/25/2013   Pure hypercholesterolemia 03/25/2013   Infective tonsillitis 03/25/2013   Adenomatous polyp 03/25/2013    This entire session was performed under direct supervision and direction of a licensed therapist/therapist assistant . I have personally read, edited and approve of the note as written.     This  licensed clinician was present and actively directing care throughout the session at all times.  Norman Herrlich PT ,DPT Physical Therapist- [facility]     [place], [place] 06/16/2022, 2:59 PM  [facility] [address] [place], [place], [address] Phone: [phone]   Fax:  [phone]

## 2022-06-21 ENCOUNTER — Ambulatory Visit: Payer: Medicare Other | Admitting: Occupational Therapy

## 2022-06-21 ENCOUNTER — Ambulatory Visit: Payer: Medicare Other | Admitting: Physical Therapy

## 2022-06-21 ENCOUNTER — Encounter: Payer: Self-pay | Admitting: Physical Therapy

## 2022-06-21 DIAGNOSIS — M6281 Muscle weakness (generalized): Secondary | ICD-10-CM | POA: Diagnosis not present

## 2022-06-21 DIAGNOSIS — R278 Other lack of coordination: Secondary | ICD-10-CM

## 2022-06-21 DIAGNOSIS — R251 Tremor, unspecified: Secondary | ICD-10-CM

## 2022-06-21 DIAGNOSIS — R2681 Unsteadiness on feet: Secondary | ICD-10-CM

## 2022-06-21 NOTE — Addendum Note (Signed)
Addended by: Thresa Ross B on: 06/21/2022 05:04 PM   Modules accepted: Orders

## 2022-06-21 NOTE — Therapy (Cosign Needed Addendum)
Outpatient Physical Therapy Treatment    Patient Details  Name: Charles Stanton MRN: [id number] Date of Birth: May 29, 1955 Referring Provider (PT): Dr. Cristopher Peru   Encounter Date: 06/21/2022  END OF SESSION:    PT End of Session - 06/21/22 1400     Visit Number 31    Number of Visits 40    Date for PT Re-Evaluation 07/19/22    Authorization Type Medicare A & B primary; federal employee secondary    Authorization Time Period -07/19/22    Progress Note Due on Visit 30    PT Start Time 1300    PT Stop Time 1343    PT Time Calculation (min) 43 min    Equipment Utilized During Treatment Gait belt    Activity Tolerance Patient tolerated treatment well;No increased pain    Behavior During Therapy [facility] for tasks assessed/performed                             Past Medical History:  Diagnosis Date   Abnormal liver function    Anemia    Anxiety    Atrial fibrillation (HCC)    Cancer (HCC) 2017   skin   Colonic mass    Depression    Dizziness    Dry cough    History of colon polyps    Hypercholesteremia    Hyperlipemia    Hypertension    Palpitations    Parkinson's disease    Parkinson's disease    PVD (peripheral vascular disease) (HCC)    Tonsillitis 03/25/2013   Tremors of nervous system     Past Surgical History:  Procedure Laterality Date   BACK SURGERY     Kyphoplasty   March 2018   COLONOSCOPY     COLONOSCOPY WITH PROPOFOL N/A 12/12/2017   Procedure: COLONOSCOPY WITH PROPOFOL;  Surgeon: Wyline Mood, MD;  Location: [facility] ENDOSCOPY;  Service: Gastroenterology;  Laterality: N/A;   ESOPHAGOGASTRODUODENOSCOPY N/A 12/11/2017   Procedure: ESOPHAGOGASTRODUODENOSCOPY (EGD);  Surgeon: Wyline Mood, MD;  Location: [facility] ENDOSCOPY;  Service: Gastroenterology;  Laterality: N/A;   ESOPHAGOGASTRODUODENOSCOPY N/A 10/08/2021   Procedure: ESOPHAGOGASTRODUODENOSCOPY (EGD);  Surgeon: Regis Bill, MD;  Location: [facility] ENDOSCOPY;  Service: Endoscopy;   Laterality: N/A;   ESOPHAGOGASTRODUODENOSCOPY (EGD) WITH PROPOFOL N/A 09/21/2016   Procedure: ESOPHAGOGASTRODUODENOSCOPY (EGD) WITH PROPOFOL;  Surgeon: Wyline Mood, MD;  Location: [facility] ENDOSCOPY;  Service: Gastroenterology;  Laterality: N/A;   ESOPHAGOGASTRODUODENOSCOPY (EGD) WITH PROPOFOL N/A 11/07/2016   Procedure: ESOPHAGOGASTRODUODENOSCOPY (EGD) WITH PROPOFOL;  Surgeon: Wyline Mood, MD;  Location: [facility] ENDOSCOPY;  Service: Gastroenterology;  Laterality: N/A;   ESOPHAGOGASTRODUODENOSCOPY (EGD) WITH PROPOFOL N/A 10/15/2019   Procedure: ESOPHAGOGASTRODUODENOSCOPY (EGD) WITH PROPOFOL;  Surgeon: Regis Bill, MD;  Location: [facility] ENDOSCOPY;  Service: Endoscopy;  Laterality: N/A;   ESOPHAGOGASTRODUODENOSCOPY (EGD) WITH PROPOFOL N/A 10/28/2019   Procedure: ESOPHAGOGASTRODUODENOSCOPY (EGD) WITH PROPOFOL;  Surgeon: Regis Bill, MD;  Location: [facility] ENDOSCOPY;  Service: Endoscopy;  Laterality: N/A;   ESOPHAGOGASTRODUODENOSCOPY (EGD) WITH PROPOFOL N/A 12/02/2019   Procedure: ESOPHAGOGASTRODUODENOSCOPY (EGD) WITH PROPOFOL;  Surgeon: Regis Bill, MD;  Location: [facility] ENDOSCOPY;  Service: Endoscopy;  Laterality: N/A;   ESOPHAGOGASTRODUODENOSCOPY (EGD) WITH PROPOFOL N/A 02/18/2020   Procedure: ESOPHAGOGASTRODUODENOSCOPY (EGD) WITH PROPOFOL;  Surgeon: Regis Bill, MD;  Location: [facility] ENDOSCOPY;  Service: Endoscopy;  Laterality: N/A;   ESOPHAGOGASTRODUODENOSCOPY (EGD) WITH PROPOFOL N/A 08/02/2021   Procedure: ESOPHAGOGASTRODUODENOSCOPY (EGD) WITH PROPOFOL;  Surgeon: Regis Bill, MD;  Location: [facility] ENDOSCOPY;  Service: Endoscopy;  Laterality: N/A;  REQUEST AM   ESOPHAGOGASTRODUODENOSCOPY (EGD) WITH PROPOFOL N/A 10/05/2021   Procedure: ESOPHAGOGASTRODUODENOSCOPY (EGD) WITH PROPOFOL;  Surgeon: Regis Bill, MD;  Location: [facility] ENDOSCOPY;  Service: Endoscopy;  Laterality: N/A;   FRACTURE SURGERY Right 05/28/2019   distal radius fracture   HERNIA REPAIR     left inguinal  hernia repair as an infant   KYPHOPLASTY N/A 02/11/2016   Procedure: KYPHOPLASTY  L2,T9;  Surgeon: Kennedy Bucker, MD;  Location: [facility] ORS;  Service: Orthopedics;  Laterality: N/A;   KYPHOPLASTY N/A 03/15/2016   Procedure: KYPHOPLASTY L3;  Surgeon: Kennedy Bucker, MD;  Location: [facility] ORS;  Service: Orthopedics;  Laterality: N/A;   OPEN REDUCTION INTERNAL FIXATION (ORIF) DISTAL RADIAL FRACTURE Right 05/28/2019   Procedure: OPEN REDUCTION INTERNAL FIXATION (ORIF) DISTAL RADIAL FRACTURE;  Surgeon: Kennedy Bucker, MD;  Location: [facility] ORS;  Service: Orthopedics;  Laterality: Right;   skin cancer removed     TONSILLECTOMY       Subjective Assessment -      Subjective   Pt reports doing well today. Pt denies any recent falls/stumbles since prior session. Pt reports good compliance with HEP when time permits. No significant changes since previous session.    Pertinent History Charles Stanton is a 66yoM who was prescribed with Parkinson's Disease ~ 10 years.  Pt is followed by neurology, reports good reponse to medications which help control his tremors somewhat. Pt uses a SPC for AMB, also uses a walker for longer distance walking. Pt goes into community almost daily (they like to eat out) which includes lots of SPC [place] eand navigation of 4 stairs at home.    Currently in Pain? No/denies             TREATMENT Unless otherwise stated, CGA was provided and gait belt donned in order to ensure pt safety   Session began with modified DGI assessment with use of cane per previous session where pt completed all goals set, due to patient still having potential of improvement. See goal/score per goals and flowsheet  sections below.  [facility] PT Assessment - 06/21/22 0001       Standardized Balance Assessment   Standardized Balance Assessment Dynamic Gait Index      Dynamic Gait Index   Level Surface Mild Impairment    Change in Gait Speed Moderate Impairment    Gait with Horizontal Head Turns Moderate Impairment     Gait with Vertical Head Turns Moderate Impairment    Gait and Pivot Turn Moderate Impairment    Step Over Obstacle Mild Impairment    Step Around Obstacles Mild Impairment    Steps Moderate Impairment    Total Score 11    DGI comment: Performed due to overall goal completion previous session; patient still has room for potential.             Additional treatment exercises can be found below  Exercise/Activity Sets/Reps/Time/ Resistance Assistance Charge type Comments  .ptass      Modified to standing with UE on chairs PWR! Move step, rock, twist X 10 ea  2 sets of standing rock  NMR Eye/hand boosts performed for this PWR! Move set.  Seated PWR! Moves - modified step with added seated PWR! Up  X 10 ea rock, up, and step progression  NMR Progression with step to improve transferred ability with car transfers   Ambulation training walking with metronome at 50 BPM for sequencing with cane   1st bout: ~1100 ft  down to [facility] and back  2nd bout 300 ft, 2 laps around clinic   TE Overground working on endurance at 50 BPM, challenging environment through and around hallway foot traffic and various objects.      PWR! Up works on postural strengthening and antigravity extension target muscles, PRW! Rock working on [employer] shifting, PRW! Twist targeting trunk rotation and PRW! Step targeting transition movements. PWR! Moves target bradykinesia, rigidity, and dyskinesia through targeted functional movements that address four core movement difficulties for people with Parkinson's disease.              Treatment Provided this session   Pt educated throughout session about proper posture and technique with exercises. Improved exercise technique, movement at target joints, use of target muscles after min to mod verbal, visual, tactile cues.   Note: Portions of this document were prepared using Dragon voice recognition software and although reviewed may contain unintentional  dictation errors in syntax, grammar, or spelling.     PT Education -     Education Details Pt educated throughout session about proper posture and technique with exercises. Improved exercise technique, movement at target joints, use of target muscles after min to mod verbal, visual, tactile cues.      Person(s) Educated Patient    Methods Explanation;Demonstration ; handout   Comprehension Verbalized understanding;Returned demonstration;Need further instruction              PT Short Term Goals -       PT SHORT TERM GOAL #1   Title Pt to report regular performance of exercises prescribed for home and a sense of improvements in strength and balance AEB them not being as challenging anymore.    Baseline Will issue on visit 2    Time 4    Period Weeks    Status met   Target Date 03/15/22      PT SHORT TERM GOAL #2   Title Pt to demonstrate improved power AEB improved 5xSTS hands free in <18sec.    Baseline 03/01/22: 22sec hands free, author provideing foot block bilat 03/31/22: 14.82sec 4/23:17.3 sec   Time 4    Period Weeks    Status Met    Target Date 03/29/22               PT Long Term Goals - Target goal date for all remaining long term goals is 07/19/2022        PT LONG TERM GOAL #1   Title Pt to improve score on FOTO survey to 57 to indicate reduced difficulty with basic mobility required for ADL performance.    Baseline 03/01/22:49 04/29/22:56%. 06/16/22: 60   Time 8    Period Weeks    Status Met          PT LONG TERM GOAL #2   Title Pt to improve tolerance to overground AMB c SPC AEB performance >863ft.    Baseline 2/27: fatigued after 463ft lap performed in four minutes ten seconds. 03/31/22: 739ft with SPC no rest break 04/26/22: 794 ft 06/16/22: 903 ft with SPC, no rest.   Time 8    Period Weeks    Status Met         PT LONG TERM GOAL #3   Title Pt to demonstrate improved leg power AEB 5xSTS from chair <14sec hands free and without need for  minGuard assist for safety and without need for foot block.    Baseline 03/01/22: 22sec hands free,  feet blocked, minGuard assist  03/31/22: 14.82sec  4/23:17.3 sec 06/16/22: 13.87 sec   Time 8     Period Weeks    Status MET/ONGOING         PT LONG TERM GOAL #4   Title Pt to demonstrate improvement in balance performance AEB >6 improvement BERG Balance for balance assessment.    Baseline 41 on 03/03/22. 3/38/24: 42 4/23: 45 06/16/22: 48   Time 8    Period Weeks    Status Met          5.  Pt will improve distance with LRAD to 900 feet or greater in order to indicate further progress to community ambulation distances Baseline: 794 ft with SPC , 06/16/22: 903 ft with SPC. Goal status: Met/ongoing (continue to assess to ensure maintenance of progress)  6.  Patient will improve modified DGI (with use of cane) score > 4 points or greater in order to indicate improved balance and decreased risk of falls.  Baseline: 6/18: 11 Goal status: INITIAL      Plan     Clinical Impression Statement  Physical therapy treatment session today consisted of additional assessment of DGI per patients goals progress, in order to appropriately assess patients improving functional level. As per previous session, current and BERG scores pt is still at risk for falls and not ambulating at community ambulation level. Today's session also focused on continuation of metronome use to improve fait sequencing with cane, especially around corners. Based on narrow margins of goal completion and continued risk for falls based on standardized balance assessments , would continue to benefit from skilled physical therapy intervention to address impairments, improve QOL, and improve upon therapy goals. Patient's condition has the potential to improve in response to therapy.     Personal Factors and Comorbidities Time since onset of injury/illness/exacerbation;Past/Current Experience    Examination-Activity Limitations  Locomotion Level;Transfers;Bed Mobility;Reach Overhead;Hygiene/Grooming;Dressing;Toileting;Stairs;Bend    Stability/Clinical Decision Making Evolving/Moderate complexity    Clinical Decision Making Moderate    Rehab Potential Good    PT Frequency 2x / week    PT Duration 8 weeks    PT Treatment/Interventions ADLs/Self Care Home Management;Moist Heat;Gait training;Stair training;Functional mobility training;Therapeutic activities;Therapeutic exercise;Balance training;Neuromuscular re-education;Patient/family education    PT Next Visit Plan .  STS PWR! Up with eventual addition of PWR! Step. Work on weight shift cues with STS. Dynamic balance/gait training as indicated.    PT Home Exercise Plan 03/01/22: discussed finding and elevated sit surface for future STS exercises (something ~18-19 inches high)    Consulted and Agree with Plan of Care Patient             Patient will benefit from skilled therapeutic intervention in order to improve the following deficits and impairments:  balance, strength, coordination, gait, transfers. Posture. ROM   Visit Diagnosis: Muscle weakness (generalized)  Other lack of coordination  Tremor  Unsteadiness on feet    Problem List Patient Active Problem List   Diagnosis Date Noted   Acute respiratory failure due to COVID-19 (HCC) 05/28/2019   Alcohol dependence with uncomplicated withdrawal (HCC) 01/23/2018   Alcohol withdrawal (HCC) 01/23/2018   GIB (gastrointestinal bleeding) 12/09/2017   A-fib (HCC) 12/09/2017   Esophageal obstruction 10/10/2016   Esophageal ulceration 10/10/2016   GI bleed 09/20/2016   Low vitamin B12 level 03/18/2016   S/P kyphoplasty 03/18/2016   Basal cell carcinoma 02/08/2016   Atrial fibrillation (HCC) 01/27/2016   Colitis 01/27/2016   HLD (hyperlipidemia) 01/27/2016  Anxiety 01/27/2016   Hypokalemia 01/27/2016   Major depressive disorder, recurrent episode, moderate (HCC) 04/14/2015   Anxiety, generalized  12/06/2013   Parkinson disease 08/07/2013   Dysphagia 05/12/2013   Esophageal mass 05/12/2013   GERD (gastroesophageal reflux disease) 05/12/2013   Vomiting 05/12/2013   Open bite of lower leg 05/10/2013   Dog bite of lower leg 05/10/2013   Abnormal finding on liver function 03/25/2013   Benign neoplasm 03/25/2013   Dizziness 03/25/2013   Type 2 diabetes mellitus (HCC) 03/25/2013   BP (high blood pressure) 03/25/2013   Awareness of heartbeats 03/25/2013   Pure hypercholesterolemia 03/25/2013   Infective tonsillitis 03/25/2013   Adenomatous polyp 03/25/2013       Nani Gasser, Student-PT 06/21/2022, 2:12 PM  This entire session was performed under direct supervision and direction of a licensed therapist/therapist assistant . I have personally read, edited and approve of the note as written.    This licensed clinician was present and actively directing care throughout the session at all times.  Norman Herrlich PT ,DPT Physical Therapist- [place]  [facility]   [facility] [address] [place], [place], [address] Phone: [phone]   Fax:  [phone]

## 2022-06-21 NOTE — Therapy (Signed)
OUTPATIENT OCCUPATIONAL THERAPY NEURO TREATMENT NOTE  Patient Name: Charles Stanton MRN: [id number] DOB:Nov 05, 1955, 67 y.o., male Today's Date: 06/21/2022  PCP: Dr. Barbette Reichmann REFERRING PROVIDER: Dr. Cristopher Peru  END OF SESSION:  OT End of Session - 06/21/22 1522     Visit Number 6    Number of Visits 24    Date for OT Re-Evaluation 08/18/22    OT Start Time 1345    OT Stop Time 1430    OT Time Calculation (min) 45 min    Activity Tolerance Patient tolerated treatment well    Behavior During Therapy [facility] for tasks assessed/performed            Past Medical History:  Diagnosis Date   Abnormal liver function    Anemia    Anxiety    Atrial fibrillation (HCC)    Cancer (HCC) 2017   skin   Colonic mass    Depression    Dizziness    Dry cough    History of colon polyps    Hypercholesteremia    Hyperlipemia    Hypertension    Palpitations    Parkinson's disease    Parkinson's disease    PVD (peripheral vascular disease) (HCC)    Tonsillitis 03/25/2013   Tremors of nervous system    Past Surgical History:  Procedure Laterality Date   BACK SURGERY     Kyphoplasty   March 2018   COLONOSCOPY     COLONOSCOPY WITH PROPOFOL N/A 12/12/2017   Procedure: COLONOSCOPY WITH PROPOFOL;  Surgeon: Wyline Mood, MD;  Location: [facility] ENDOSCOPY;  Service: Gastroenterology;  Laterality: N/A;   ESOPHAGOGASTRODUODENOSCOPY N/A 12/11/2017   Procedure: ESOPHAGOGASTRODUODENOSCOPY (EGD);  Surgeon: Wyline Mood, MD;  Location: [facility] ENDOSCOPY;  Service: Gastroenterology;  Laterality: N/A;   ESOPHAGOGASTRODUODENOSCOPY N/A 10/08/2021   Procedure: ESOPHAGOGASTRODUODENOSCOPY (EGD);  Surgeon: Regis Bill, MD;  Location: [facility] ENDOSCOPY;  Service: Endoscopy;  Laterality: N/A;   ESOPHAGOGASTRODUODENOSCOPY (EGD) WITH PROPOFOL N/A 09/21/2016   Procedure: ESOPHAGOGASTRODUODENOSCOPY (EGD) WITH PROPOFOL;  Surgeon: Wyline Mood, MD;  Location: [facility] ENDOSCOPY;  Service: Gastroenterology;  Laterality:  N/A;   ESOPHAGOGASTRODUODENOSCOPY (EGD) WITH PROPOFOL N/A 11/07/2016   Procedure: ESOPHAGOGASTRODUODENOSCOPY (EGD) WITH PROPOFOL;  Surgeon: Wyline Mood, MD;  Location: [facility] ENDOSCOPY;  Service: Gastroenterology;  Laterality: N/A;   ESOPHAGOGASTRODUODENOSCOPY (EGD) WITH PROPOFOL N/A 10/15/2019   Procedure: ESOPHAGOGASTRODUODENOSCOPY (EGD) WITH PROPOFOL;  Surgeon: Regis Bill, MD;  Location: [facility] ENDOSCOPY;  Service: Endoscopy;  Laterality: N/A;   ESOPHAGOGASTRODUODENOSCOPY (EGD) WITH PROPOFOL N/A 10/28/2019   Procedure: ESOPHAGOGASTRODUODENOSCOPY (EGD) WITH PROPOFOL;  Surgeon: Regis Bill, MD;  Location: [facility] ENDOSCOPY;  Service: Endoscopy;  Laterality: N/A;   ESOPHAGOGASTRODUODENOSCOPY (EGD) WITH PROPOFOL N/A 12/02/2019   Procedure: ESOPHAGOGASTRODUODENOSCOPY (EGD) WITH PROPOFOL;  Surgeon: Regis Bill, MD;  Location: [facility] ENDOSCOPY;  Service: Endoscopy;  Laterality: N/A;   ESOPHAGOGASTRODUODENOSCOPY (EGD) WITH PROPOFOL N/A 02/18/2020   Procedure: ESOPHAGOGASTRODUODENOSCOPY (EGD) WITH PROPOFOL;  Surgeon: Regis Bill, MD;  Location: [facility] ENDOSCOPY;  Service: Endoscopy;  Laterality: N/A;   ESOPHAGOGASTRODUODENOSCOPY (EGD) WITH PROPOFOL N/A 08/02/2021   Procedure: ESOPHAGOGASTRODUODENOSCOPY (EGD) WITH PROPOFOL;  Surgeon: Regis Bill, MD;  Location: [facility] ENDOSCOPY;  Service: Endoscopy;  Laterality: N/A;  REQUEST AM   ESOPHAGOGASTRODUODENOSCOPY (EGD) WITH PROPOFOL N/A 10/05/2021   Procedure: ESOPHAGOGASTRODUODENOSCOPY (EGD) WITH PROPOFOL;  Surgeon: Regis Bill, MD;  Location: [facility] ENDOSCOPY;  Service: Endoscopy;  Laterality: N/A;   FRACTURE SURGERY Right 05/28/2019   distal radius fracture   HERNIA REPAIR     left inguinal hernia  repair as an infant   KYPHOPLASTY N/A 02/11/2016   Procedure: KYPHOPLASTY  L2,T9;  Surgeon: Kennedy Bucker, MD;  Location: [facility] ORS;  Service: Orthopedics;  Laterality: N/A;   KYPHOPLASTY N/A 03/15/2016   Procedure: KYPHOPLASTY L3;   Surgeon: Kennedy Bucker, MD;  Location: [facility] ORS;  Service: Orthopedics;  Laterality: N/A;   OPEN REDUCTION INTERNAL FIXATION (ORIF) DISTAL RADIAL FRACTURE Right 05/28/2019   Procedure: OPEN REDUCTION INTERNAL FIXATION (ORIF) DISTAL RADIAL FRACTURE;  Surgeon: Kennedy Bucker, MD;  Location: [facility] ORS;  Service: Orthopedics;  Laterality: Right;   skin cancer removed     TONSILLECTOMY     Patient Active Problem List   Diagnosis Date Noted   Acute respiratory failure due to COVID-19 (HCC) 05/28/2019   Alcohol dependence with uncomplicated withdrawal (HCC) 01/23/2018   Alcohol withdrawal (HCC) 01/23/2018   GIB (gastrointestinal bleeding) 12/09/2017   A-fib (HCC) 12/09/2017   Esophageal obstruction 10/10/2016   Esophageal ulceration 10/10/2016   GI bleed 09/20/2016   Low vitamin B12 level 03/18/2016   S/P kyphoplasty 03/18/2016   Basal cell carcinoma 02/08/2016   Atrial fibrillation (HCC) 01/27/2016   Colitis 01/27/2016   HLD (hyperlipidemia) 01/27/2016   Anxiety 01/27/2016   Hypokalemia 01/27/2016   Major depressive disorder, recurrent episode, moderate (HCC) 04/14/2015   Anxiety, generalized 12/06/2013   Parkinson disease 08/07/2013   Dysphagia 05/12/2013   Esophageal mass 05/12/2013   GERD (gastroesophageal reflux disease) 05/12/2013   Vomiting 05/12/2013   Open bite of lower leg 05/10/2013   Dog bite of lower leg 05/10/2013   Abnormal finding on liver function 03/25/2013   Benign neoplasm 03/25/2013   Dizziness 03/25/2013   Type 2 diabetes mellitus (HCC) 03/25/2013   BP (high blood pressure) 03/25/2013   Awareness of heartbeats 03/25/2013   Pure hypercholesterolemia 03/25/2013   Infective tonsillitis 03/25/2013   Adenomatous polyp 03/25/2013   ONSET DATE: 10 years   REFERRING DIAG: Parkinson's Disease  THERAPY DIAG:  Muscle weakness (generalized)  Other lack of coordination  Rationale for Evaluation and Treatment: Rehabilitation  SUBJECTIVE:  SUBJECTIVE STATEMENT: Pt  reports being a little tired from PT session prior to OT. Pt accompanied by: self; spouse present in lobby, but did not follow for eval  PERTINENT HISTORY: Pt has been working with outpatient PT here at [facility] since end of Feb 2024.  OT ordered d/t worsening PD symptoms contributing to functional decline with ADLs.   PRECAUTIONS: Fall  WEIGHT BEARING RESTRICTIONS: No  PAIN:  Are you having pain? No  FALLS: Has patient fallen in last 6 months? Yes. Number of falls 1  LIVING ENVIRONMENT: Lives with: lives with their spouse Lives in: split level but pt remains on main level to avoid steps Stairs: Yes: Internal: 2 steps; can reach both Has following equipment at home: Quad cane small base, Walker - 4 wheeled, bed side commode, Grab bars, and pull down seat for shower, hand held shower hose,   PLOF: Needs assistance with ADLs; pt reports that spouse assists but is consistent to always let pt try things first before she automatically helps.    PATIENT GOALS: To be more indep  OBJECTIVE:  HAND DOMINANCE: Right (most affected side from PD)  ADLs: Overall ADLs: spouse is primary caregiver Transfers/ambulation related to ADLs: uses RW Eating: spouse cuts food; pt reports having to use L non-dominant hand to eat for the last 5-6 yrs Grooming: set up; assist to squeeze toothpaste and denture paste; uses L non-dominant hand UB Dressing: set up; assist with  clothing fasteners LB Dressing: Min A; assist with socks, dons slip on shoes with set up (has tried a sockaid but was unsuccessful) Toileting: modified indep Bathing: Set up/min A; spouse squeezes shampoo into pt's hands, spouse helps to towel dry  Tub Shower transfers: distant supv Equipment:  see above   IADLs: Shopping: pt can accompany spouse by pushing a shopping cart.  Light housekeeping: pt can drag trash to bin and can roll the bin to the road and back Meal Prep: spouse manages  Community mobility: spouse drives; uses rollator  for longer distances  Medication management: spouse manages pills using weekly pill organizer.   Financial management: spouse manages  Handwriting: Mild micrographia and <25% legible  MOBILITY STATUS: Hx of falls  POSTURE COMMENTS:  rounded shoulders and forward head, R shoulder elevated  ACTIVITY TOLERANCE: Activity tolerance: TBD and assessed further with functional activities   FUNCTIONAL OUTCOME MEASURES: FOTO: TBD   UPPER EXTREMITY ROM:    Active ROM Right eval Left eval  Shoulder flexion 118 85  Shoulder abduction 128 124  Shoulder adduction    Shoulder extension    Shoulder internal rotation [facility] WFL  Shoulder external rotation [facility] WFL  Elbow flexion    Elbow extension    Wrist flexion 55 76  Wrist extension 57 75  Wrist ulnar deviation    Wrist radial deviation    Wrist pronation [facility] WFL  Wrist supination 63 75  (Blank rows = not tested)  UPPER EXTREMITY MMT:     MMT Right eval Left eval  Shoulder flexion 4 4  Shoulder abduction 4+ 4+  Shoulder adduction    Shoulder extension    Shoulder internal rotation    Shoulder external rotation    Middle trapezius    Lower trapezius    Elbow flexion 4+ 4+  Elbow extension 4+ 4+  Wrist flexion 4+ 4+  Wrist extension 4+ 4+  Wrist ulnar deviation    Wrist radial deviation    Wrist pronation    Wrist supination    (Blank rows = not tested)  HAND FUNCTION: Grip strength: Right: 16 lbs; Left: 38 lbs, Lateral pinch: Right: 10 lbs, Left: 14 lbs, and 3 point pinch: Right: 9 lbs, Left: 11 lbs  COORDINATION: 9 Hole Peg test: Right: 38 sec; Left: 40 sec  SENSATION: WFL  EDEMA: None  MUSCLE TONE: R/L normal  COGNITION: Overall cognitive status: Within functional limits for tasks assessed  VISION: Subjective report: wears glasses all the time    PRAXIS: Impaired: Motor planning  OBSERVATIONS:  R SF PIP flexion contracture 105* from 5-6 years ago from a fall  TODAY'S TREATMENT:                                                                                                                               DATE: 06/21/22:  Therapeutic Exercise:  Facilitated hand strengthening with use of a hand gripper set at 6.6# on the R  and 11.2# on the L# to remove jumbo pegs from pegboard x2 trials each hand. Intermittent min vc and tactile cues for set up of hand gripper in hand to maximize closure with each gripping rep. Facilitated lateral, and 3pt. Pinch strengthening with use of therapy resistant clips using her R/L hands to place them on a target.   Neuromuscular re-education:  Pt. worked on [facility] skills grasping 1/2" flat marbles, followed by a circular marbles and storing them in the palm of his hand. Pt. Worked on moving them from his palm to the tip of his 2nd digit, and thumb in preparation for discarding them. At times Pt. Discarded them from the ulnar aspect of his palm.  PATIENT EDUCATION: Education details: Potential benefit of wrist weight to minimize UE tremor Person educated: Patient Education method: Explanation and Verbal cues, demo Education comprehension: verbalized understanding, demonstrated understanding  HOME EXERCISE PROGRAM: Theraputty   GOALS: Goals reviewed with patient? Yes  SHORT TERM GOALS: Target date: 07/07/22 (6 weeks)  Pt will be indep to perform HEP for improving bilat hand strength and coordination skills. Baseline: Eval: not yet initiated Goal status: INITIAL  LONG TERM GOALS: Target date: 08/18/22 (12 weeks)  Pt will increase FOTO score to (TBD) or better to indicate improvement in self perceived functional use of the R arm with daily tasks. Baseline: Eval: TBD Goal status: INITIAL  2.  Pt will increase R grip strength by 5 or more lbs to more easily hold and stabilize ADL supplies in the R dominant hand. Baseline: R grip 16 lbs; limited from PD but also by 5th digit PIP flexion contracture, as this digit can not engage in gripping (L 38 lbs) Goal status:  INITIAL  3.  Pt will improve R hand FMC/dexterity skills to be able to sign his name on medical/legal documents with 75% legibility, using adapted writing aids as needed. Baseline: Eval: <25% legible and pt verbalizes embarrassment when attempting to sign his name on forms Goal status: INITIAL  4.  Pt will increase R FMC/GMC skills to engage the RUE into UB ADLs at least 50% of the time. Baseline: Eval: Pt uses the L non-dominant arm for self care tasks. Goal status: INITIAL  ASSESSMENT:  CLINICAL IMPRESSION:  Pt. participated in [facility] task for RUE with trial of 1/2# and 1# wrist weight to assess for benefit in minimizing tremor for better FM control.  Pt reported that he did not find much benefit from the weight.  Good tolerance to therapeutic exercises this date.  Pt tolerated grip strength adjusted 11.2# for half to  6.6# for the remaining half on the R and 11.2# on the L using the vertical hand gripper to remove jumbo pegs from pegboard. Pt continues to present with decreased on the R d/t R SF flexion contracture preventing this digit from engaging in gripping on this hand. Pt required cues, and assist for visual demonstration for [facility] skills. Pt required frequent rest breaks with gripping and pinching activities due to weakness in the hands, and intermittent tactile and vc for grip strengthening, pinch strengthening,  and FMC skills,/prehension patterns.  Pt. continues to benefit from skilled OT for increasing bilateral hand strength, improve coordination, specifically working to increase engagement of the RUE during self care tasks.    PERFORMANCE DEFICITS: in functional skills including ADLs, IADLs, coordination, dexterity, ROM, strength, flexibility, Fine motor control, Gross motor control, mobility, balance, body mechanics, endurance, decreased knowledge of use of DME, and UE functional use, cognitive skills including  emotional and memory, and psychosocial skills including coping strategies,  environmental adaptation, habits, and routines and behaviors.   IMPAIRMENTS: are limiting patient from ADLs, IADLs, and leisure.   CO-MORBIDITIES: has co-morbidities such as anxiety, depression  that affects occupational performance. Patient will benefit from skilled OT to address above impairments and improve overall function.  MODIFICATION OR ASSISTANCE TO COMPLETE EVALUATION: No modification of tasks or assist necessary to complete an evaluation.  OT OCCUPATIONAL PROFILE AND HISTORY: Problem focused assessment: Including review of records relating to presenting problem.  CLINICAL DECISION MAKING: Moderate - several treatment options, min-mod task modification necessary  REHAB POTENTIAL: Good for goals  EVALUATION COMPLEXITY: Moderate    PLAN:  OT FREQUENCY: 2x/week  OT DURATION: 12 weeks  PLANNED INTERVENTIONS: self care/ADL training, therapeutic exercise, therapeutic activity, neuromuscular re-education, manual therapy, passive range of motion, balance training, functional mobility training, moist heat, cryotherapy, patient/family education, cognitive remediation/compensation, psychosocial skills training, energy conservation, coping strategies training, and DME and/or AE instructions  RECOMMENDED OTHER SERVICES: None at this time  CONSULTED AND AGREED WITH PLAN OF CARE: Patient  PLAN FOR NEXT SESSION: initiate HEP, complete Darrel Hoover, MS, OTR/L  06/21/2022

## 2022-06-23 ENCOUNTER — Encounter: Payer: Self-pay | Admitting: Physical Therapy

## 2022-06-23 ENCOUNTER — Ambulatory Visit: Payer: Medicare Other | Admitting: Physical Therapy

## 2022-06-23 DIAGNOSIS — R2681 Unsteadiness on feet: Secondary | ICD-10-CM

## 2022-06-23 DIAGNOSIS — M6281 Muscle weakness (generalized): Secondary | ICD-10-CM

## 2022-06-23 DIAGNOSIS — R278 Other lack of coordination: Secondary | ICD-10-CM

## 2022-06-23 DIAGNOSIS — R262 Difficulty in walking, not elsewhere classified: Secondary | ICD-10-CM

## 2022-06-23 DIAGNOSIS — R251 Tremor, unspecified: Secondary | ICD-10-CM

## 2022-06-23 NOTE — Therapy (Signed)
Outpatient Physical Therapy Treatment    Patient Details  Name: Charles Stanton MRN: [id number] Date of Birth: 28-Sep-1955 Referring Provider (PT): Dr. Cristopher Peru   Encounter Date: 06/23/2022  END OF SESSION:    PT End of Session - 06/23/22 1130     Visit Number 32    Number of Visits 40    Date for PT Re-Evaluation 07/19/22    Authorization Type Medicare A & B primary; federal employee secondary    Authorization Time Period -07/19/22    Progress Note Due on Visit 30    PT Start Time 1135    PT Stop Time 1220    PT Time Calculation (min) 45 min    Equipment Utilized During Treatment Gait belt    Activity Tolerance Patient tolerated treatment well;No increased pain    Behavior During Therapy [facility] for tasks assessed/performed                              Past Medical History:  Diagnosis Date   Abnormal liver function    Anemia    Anxiety    Atrial fibrillation (HCC)    Cancer (HCC) 2017   skin   Colonic mass    Depression    Dizziness    Dry cough    History of colon polyps    Hypercholesteremia    Hyperlipemia    Hypertension    Palpitations    Parkinson's disease    Parkinson's disease    PVD (peripheral vascular disease) (HCC)    Tonsillitis 03/25/2013   Tremors of nervous system     Past Surgical History:  Procedure Laterality Date   BACK SURGERY     Kyphoplasty   March 2018   COLONOSCOPY     COLONOSCOPY WITH PROPOFOL N/A 12/12/2017   Procedure: COLONOSCOPY WITH PROPOFOL;  Surgeon: Wyline Mood, MD;  Location: [facility] ENDOSCOPY;  Service: Gastroenterology;  Laterality: N/A;   ESOPHAGOGASTRODUODENOSCOPY N/A 12/11/2017   Procedure: ESOPHAGOGASTRODUODENOSCOPY (EGD);  Surgeon: Wyline Mood, MD;  Location: [facility] ENDOSCOPY;  Service: Gastroenterology;  Laterality: N/A;   ESOPHAGOGASTRODUODENOSCOPY N/A 10/08/2021   Procedure: ESOPHAGOGASTRODUODENOSCOPY (EGD);  Surgeon: Regis Bill, MD;  Location: [facility] ENDOSCOPY;  Service: Endoscopy;   Laterality: N/A;   ESOPHAGOGASTRODUODENOSCOPY (EGD) WITH PROPOFOL N/A 09/21/2016   Procedure: ESOPHAGOGASTRODUODENOSCOPY (EGD) WITH PROPOFOL;  Surgeon: Wyline Mood, MD;  Location: [facility] ENDOSCOPY;  Service: Gastroenterology;  Laterality: N/A;   ESOPHAGOGASTRODUODENOSCOPY (EGD) WITH PROPOFOL N/A 11/07/2016   Procedure: ESOPHAGOGASTRODUODENOSCOPY (EGD) WITH PROPOFOL;  Surgeon: Wyline Mood, MD;  Location: [facility] ENDOSCOPY;  Service: Gastroenterology;  Laterality: N/A;   ESOPHAGOGASTRODUODENOSCOPY (EGD) WITH PROPOFOL N/A 10/15/2019   Procedure: ESOPHAGOGASTRODUODENOSCOPY (EGD) WITH PROPOFOL;  Surgeon: Regis Bill, MD;  Location: [facility] ENDOSCOPY;  Service: Endoscopy;  Laterality: N/A;   ESOPHAGOGASTRODUODENOSCOPY (EGD) WITH PROPOFOL N/A 10/28/2019   Procedure: ESOPHAGOGASTRODUODENOSCOPY (EGD) WITH PROPOFOL;  Surgeon: Regis Bill, MD;  Location: [facility] ENDOSCOPY;  Service: Endoscopy;  Laterality: N/A;   ESOPHAGOGASTRODUODENOSCOPY (EGD) WITH PROPOFOL N/A 12/02/2019   Procedure: ESOPHAGOGASTRODUODENOSCOPY (EGD) WITH PROPOFOL;  Surgeon: Regis Bill, MD;  Location: [facility] ENDOSCOPY;  Service: Endoscopy;  Laterality: N/A;   ESOPHAGOGASTRODUODENOSCOPY (EGD) WITH PROPOFOL N/A 02/18/2020   Procedure: ESOPHAGOGASTRODUODENOSCOPY (EGD) WITH PROPOFOL;  Surgeon: Regis Bill, MD;  Location: [facility] ENDOSCOPY;  Service: Endoscopy;  Laterality: N/A;   ESOPHAGOGASTRODUODENOSCOPY (EGD) WITH PROPOFOL N/A 08/02/2021   Procedure: ESOPHAGOGASTRODUODENOSCOPY (EGD) WITH PROPOFOL;  Surgeon: Regis Bill, MD;  Location: [facility] ENDOSCOPY;  Service: Endoscopy;  Laterality: N/A;  REQUEST AM   ESOPHAGOGASTRODUODENOSCOPY (EGD) WITH PROPOFOL N/A 10/05/2021   Procedure: ESOPHAGOGASTRODUODENOSCOPY (EGD) WITH PROPOFOL;  Surgeon: Regis Bill, MD;  Location: [facility] ENDOSCOPY;  Service: Endoscopy;  Laterality: N/A;   FRACTURE SURGERY Right 05/28/2019   distal radius fracture   HERNIA REPAIR     left inguinal  hernia repair as an infant   KYPHOPLASTY N/A 02/11/2016   Procedure: KYPHOPLASTY  L2,T9;  Surgeon: Kennedy Bucker, MD;  Location: [facility] ORS;  Service: Orthopedics;  Laterality: N/A;   KYPHOPLASTY N/A 03/15/2016   Procedure: KYPHOPLASTY L3;  Surgeon: Kennedy Bucker, MD;  Location: [facility] ORS;  Service: Orthopedics;  Laterality: N/A;   OPEN REDUCTION INTERNAL FIXATION (ORIF) DISTAL RADIAL FRACTURE Right 05/28/2019   Procedure: OPEN REDUCTION INTERNAL FIXATION (ORIF) DISTAL RADIAL FRACTURE;  Surgeon: Kennedy Bucker, MD;  Location: [facility] ORS;  Service: Orthopedics;  Laterality: Right;   skin cancer removed     TONSILLECTOMY       Subjective Assessment -      Subjective Pt reports doing well today. Pt denies any recent falls/stumbles since prior session. Pt reports good compliance with HEP when time permits. No significant changes since previous session.    Pertinent History Charles Stanton is a 66yoM who was prescribed with Parkinson's Disease ~ 10 years.  Pt is followed by neurology, reports good reponse to medications which help control his tremors somewhat. Pt uses a SPC for AMB, also uses a walker for longer distance walking. Pt goes into community almost daily (they like to eat out) which includes lots of SPC [place] eand navigation of 4 stairs at home.    Currently in Pain? No/denies             TREATMENT Unless otherwise stated, CGA was provided and gait belt donned in order to ensure pt safety      Exercise/Activity Sets/Reps/Time/ Resistance Assistance Charge type Comments  Octane  3 min at lvl 2; 1 min at lvl 1.  TE Performed for aerobic priming in preparation for today's session  Modified to standing with UE on chairs PWR! Move step, rock, twist 2 X 10 ea    NMR Eye/hand boosts performed for this PWR! Move set.  Seated PWR! Moves  X 10 ea rock, up, twist, step  NMR Progression with step to improve transferred ability with car transfers   Ambulation training walking with metronome at 50 BPM for  sequencing with cane   1st bout: ~1100 ft down to [facility] and back, rest at 700 ft seated  2nd bout 300 ft, 2 laps around clinic   TE Overground working on endurance at 50 BPM, challenging environment through and around hallway foot traffic and various objects.      PWR! Up works on postural strengthening and antigravity extension target muscles, PRW! Rock working on [employer] shifting, PRW! Twist targeting trunk rotation and PRW! Step targeting transition movements. PWR! Moves target bradykinesia, rigidity, and dyskinesia through targeted functional movements that address four core movement difficulties for people with Parkinson's disease.              Treatment Provided this session   Pt educated throughout session about proper posture and technique with exercises. Improved exercise technique, movement at target joints, use of target muscles after min to mod verbal, visual, tactile cues.   Note: Portions of this document were prepared using Dragon voice recognition software and although reviewed may contain unintentional dictation errors in  syntax, grammar, or spelling.     PT Education -     Education Details Pt educated throughout session about proper posture and technique with exercises. Improved exercise technique, movement at target joints, use of target muscles after min to mod verbal, visual, tactile cues.      Person(s) Educated Patient    Methods Explanation;Demonstration ; handout   Comprehension Verbalized understanding;Returned demonstration;Need further instruction              PT Short Term Goals -       PT SHORT TERM GOAL #1   Title Pt to report regular performance of exercises prescribed for home and a sense of improvements in strength and balance AEB them not being as challenging anymore.    Baseline Will issue on visit 2    Time 4    Period Weeks    Status met   Target Date 03/15/22      PT SHORT TERM GOAL #2   Title Pt to  demonstrate improved power AEB improved 5xSTS hands free in <18sec.    Baseline 03/01/22: 22sec hands free, author provideing foot block bilat 03/31/22: 14.82sec 4/23:17.3 sec   Time 4    Period Weeks    Status Met    Target Date 03/29/22               PT Long Term Goals - Target goal date for all remaining long term goals is 07/19/2022        PT LONG TERM GOAL #1   Title Pt to improve score on FOTO survey to 57 to indicate reduced difficulty with basic mobility required for ADL performance.    Baseline 03/01/22:49 04/29/22:56%. 06/16/22: 60   Time 8    Period Weeks    Status Met          PT LONG TERM GOAL #2   Title Pt to improve tolerance to overground AMB c SPC AEB performance >825ft.    Baseline 2/27: fatigued after 431ft lap performed in four minutes ten seconds. 03/31/22: 711ft with SPC no rest break 04/26/22: 794 ft 06/16/22: 903 ft with SPC, no rest.   Time 8    Period Weeks    Status Met         PT LONG TERM GOAL #3   Title Pt to demonstrate improved leg power AEB 5xSTS from chair <14sec hands free and without need for minGuard assist for safety and without need for foot block.    Baseline 03/01/22: 22sec hands free, feet blocked, minGuard assist  03/31/22: 14.82sec  4/23:17.3 sec 06/16/22: 13.87 sec   Time 8     Period Weeks    Status MET/ONGOING         PT LONG TERM GOAL #4   Title Pt to demonstrate improvement in balance performance AEB >6 improvement BERG Balance for balance assessment.    Baseline 41 on 03/03/22. 3/38/24: 42 4/23: 45 06/16/22: 48   Time 8    Period Weeks    Status Met          5.  Pt will improve distance with LRAD to 900 feet or greater in order to indicate further progress to community ambulation distances Baseline: 794 ft with SPC , 06/16/22: 903 ft with SPC. Goal status: Met/ongoing (continue to assess to ensure maintenance of progress)  6.  Patient will improve modified DGI (with use of cane) score > 4 points or greater in order  to indicate improved balance  and decreased risk of falls.  Baseline: 6/18: 11 Goal status: INITIAL      Plan     Clinical Impression Statement Patient appeared motivated and ready for treatment on this day. Today's session also focused on continuation of metronome use to improve gait sequencing with cane, especially around corners. Continued with PWR! Move sets in both seated and standing, working on increasing durations of interventions while decreasing number and time of rest breaks. Pt will continue to benefit from skilled physical therapy intervention to address impairments, improve QOL, and attain therapy goals.    Personal Factors and Comorbidities Time since onset of injury/illness/exacerbation;Past/Current Experience    Examination-Activity Limitations Locomotion Level;Transfers;Bed Mobility;Reach Overhead;Hygiene/Grooming;Dressing;Toileting;Stairs;Bend    Stability/Clinical Decision Making Evolving/Moderate complexity    Clinical Decision Making Moderate    Rehab Potential Good    PT Frequency 2x / week    PT Duration 8 weeks    PT Treatment/Interventions ADLs/Self Care Home Management;Moist Heat;Gait training;Stair training;Functional mobility training;Therapeutic activities;Therapeutic exercise;Balance training;Neuromuscular re-education;Patient/family education    PT Next Visit Plan .  STS PWR! Up with eventual addition of PWR! Step. Work on weight shift cues with STS. Dynamic balance/gait training as indicated.    PT Home Exercise Plan 03/01/22: discussed finding and elevated sit surface for future STS exercises (something ~18-19 inches high)    Consulted and Agree with Plan of Care Patient             Patient will benefit from skilled therapeutic intervention in order to improve the following deficits and impairments:  balance, strength, coordination, gait, transfers. Posture. ROM   Visit Diagnosis: Muscle weakness (generalized)  Other lack of  coordination  Tremor  Unsteadiness on feet  Difficulty in walking, not elsewhere classified    Problem List Patient Active Problem List   Diagnosis Date Noted   Acute respiratory failure due to COVID-19 (HCC) 05/28/2019   Alcohol dependence with uncomplicated withdrawal (HCC) 01/23/2018   Alcohol withdrawal (HCC) 01/23/2018   GIB (gastrointestinal bleeding) 12/09/2017   A-fib (HCC) 12/09/2017   Esophageal obstruction 10/10/2016   Esophageal ulceration 10/10/2016   GI bleed 09/20/2016   Low vitamin B12 level 03/18/2016   S/P kyphoplasty 03/18/2016   Basal cell carcinoma 02/08/2016   Atrial fibrillation (HCC) 01/27/2016   Colitis 01/27/2016   HLD (hyperlipidemia) 01/27/2016   Anxiety 01/27/2016   Hypokalemia 01/27/2016   Major depressive disorder, recurrent episode, moderate (HCC) 04/14/2015   Anxiety, generalized 12/06/2013   Parkinson disease 08/07/2013   Dysphagia 05/12/2013   Esophageal mass 05/12/2013   GERD (gastroesophageal reflux disease) 05/12/2013   Vomiting 05/12/2013   Open bite of lower leg 05/10/2013   Dog bite of lower leg 05/10/2013   Abnormal finding on liver function 03/25/2013   Benign neoplasm 03/25/2013   Dizziness 03/25/2013   Type 2 diabetes mellitus (HCC) 03/25/2013   BP (high blood pressure) 03/25/2013   Awareness of heartbeats 03/25/2013   Pure hypercholesterolemia 03/25/2013   Infective tonsillitis 03/25/2013   Adenomatous polyp 03/25/2013       Norman Herrlich, PT 06/23/2022, 3:37 PM    [place] [facility] [address] [place], [place], [address] Phone: [phone]   Fax:  [phone]

## 2022-06-28 ENCOUNTER — Ambulatory Visit: Payer: Medicare Other | Admitting: Occupational Therapy

## 2022-06-28 ENCOUNTER — Ambulatory Visit: Payer: Medicare Other

## 2022-06-28 DIAGNOSIS — R278 Other lack of coordination: Secondary | ICD-10-CM

## 2022-06-28 DIAGNOSIS — R262 Difficulty in walking, not elsewhere classified: Secondary | ICD-10-CM

## 2022-06-28 DIAGNOSIS — R251 Tremor, unspecified: Secondary | ICD-10-CM

## 2022-06-28 DIAGNOSIS — R2689 Other abnormalities of gait and mobility: Secondary | ICD-10-CM

## 2022-06-28 DIAGNOSIS — M6281 Muscle weakness (generalized): Secondary | ICD-10-CM

## 2022-06-28 DIAGNOSIS — R2681 Unsteadiness on feet: Secondary | ICD-10-CM

## 2022-06-28 NOTE — Therapy (Signed)
Outpatient Physical Therapy Treatment    Patient Details  Name: Charles Stanton MRN: [id number] Date of Birth: 01-08-1955 Referring Provider (PT): Dr. Cristopher Peru   Encounter Date: 06/28/2022  END OF SESSION:    PT End of Session - 06/28/22 1304     Visit Number 33    Number of Visits 40    Date for PT Re-Evaluation 07/19/22    Authorization Type Medicare A & B primary; federal employee secondary    Authorization Time Period -07/19/22    Progress Note Due on Visit 30    PT Start Time 1304    PT Stop Time 1345    PT Time Calculation (min) 41 min    Equipment Utilized During Treatment Gait belt    Activity Tolerance Patient tolerated treatment well;No increased pain    Behavior During Therapy [facility] for tasks assessed/performed                Past Medical History:  Diagnosis Date   Abnormal liver function    Anemia    Anxiety    Atrial fibrillation (HCC)    Cancer (HCC) 2017   skin   Colonic mass    Depression    Dizziness    Dry cough    History of colon polyps    Hypercholesteremia    Hyperlipemia    Hypertension    Palpitations    Parkinson's disease    Parkinson's disease    PVD (peripheral vascular disease) (HCC)    Tonsillitis 03/25/2013   Tremors of nervous system     Past Surgical History:  Procedure Laterality Date   BACK SURGERY     Kyphoplasty   March 2018   COLONOSCOPY     COLONOSCOPY WITH PROPOFOL N/A 12/12/2017   Procedure: COLONOSCOPY WITH PROPOFOL;  Surgeon: Wyline Mood, MD;  Location: [facility] ENDOSCOPY;  Service: Gastroenterology;  Laterality: N/A;   ESOPHAGOGASTRODUODENOSCOPY N/A 12/11/2017   Procedure: ESOPHAGOGASTRODUODENOSCOPY (EGD);  Surgeon: Wyline Mood, MD;  Location: [facility] ENDOSCOPY;  Service: Gastroenterology;  Laterality: N/A;   ESOPHAGOGASTRODUODENOSCOPY N/A 10/08/2021   Procedure: ESOPHAGOGASTRODUODENOSCOPY (EGD);  Surgeon: Regis Bill, MD;  Location: [facility] ENDOSCOPY;  Service: Endoscopy;  Laterality: N/A;    ESOPHAGOGASTRODUODENOSCOPY (EGD) WITH PROPOFOL N/A 09/21/2016   Procedure: ESOPHAGOGASTRODUODENOSCOPY (EGD) WITH PROPOFOL;  Surgeon: Wyline Mood, MD;  Location: [facility] ENDOSCOPY;  Service: Gastroenterology;  Laterality: N/A;   ESOPHAGOGASTRODUODENOSCOPY (EGD) WITH PROPOFOL N/A 11/07/2016   Procedure: ESOPHAGOGASTRODUODENOSCOPY (EGD) WITH PROPOFOL;  Surgeon: Wyline Mood, MD;  Location: [facility] ENDOSCOPY;  Service: Gastroenterology;  Laterality: N/A;   ESOPHAGOGASTRODUODENOSCOPY (EGD) WITH PROPOFOL N/A 10/15/2019   Procedure: ESOPHAGOGASTRODUODENOSCOPY (EGD) WITH PROPOFOL;  Surgeon: Regis Bill, MD;  Location: [facility] ENDOSCOPY;  Service: Endoscopy;  Laterality: N/A;   ESOPHAGOGASTRODUODENOSCOPY (EGD) WITH PROPOFOL N/A 10/28/2019   Procedure: ESOPHAGOGASTRODUODENOSCOPY (EGD) WITH PROPOFOL;  Surgeon: Regis Bill, MD;  Location: [facility] ENDOSCOPY;  Service: Endoscopy;  Laterality: N/A;   ESOPHAGOGASTRODUODENOSCOPY (EGD) WITH PROPOFOL N/A 12/02/2019   Procedure: ESOPHAGOGASTRODUODENOSCOPY (EGD) WITH PROPOFOL;  Surgeon: Regis Bill, MD;  Location: [facility] ENDOSCOPY;  Service: Endoscopy;  Laterality: N/A;   ESOPHAGOGASTRODUODENOSCOPY (EGD) WITH PROPOFOL N/A 02/18/2020   Procedure: ESOPHAGOGASTRODUODENOSCOPY (EGD) WITH PROPOFOL;  Surgeon: Regis Bill, MD;  Location: [facility] ENDOSCOPY;  Service: Endoscopy;  Laterality: N/A;   ESOPHAGOGASTRODUODENOSCOPY (EGD) WITH PROPOFOL N/A 08/02/2021   Procedure: ESOPHAGOGASTRODUODENOSCOPY (EGD) WITH PROPOFOL;  Surgeon: Regis Bill, MD;  Location: [facility] ENDOSCOPY;  Service: Endoscopy;  Laterality: N/A;  REQUEST AM   ESOPHAGOGASTRODUODENOSCOPY (EGD) WITH  PROPOFOL N/A 10/05/2021   Procedure: ESOPHAGOGASTRODUODENOSCOPY (EGD) WITH PROPOFOL;  Surgeon: Regis Bill, MD;  Location: [facility] ENDOSCOPY;  Service: Endoscopy;  Laterality: N/A;   FRACTURE SURGERY Right 05/28/2019   distal radius fracture   HERNIA REPAIR     left inguinal hernia repair as an  infant   KYPHOPLASTY N/A 02/11/2016   Procedure: KYPHOPLASTY  L2,T9;  Surgeon: Kennedy Bucker, MD;  Location: [facility] ORS;  Service: Orthopedics;  Laterality: N/A;   KYPHOPLASTY N/A 03/15/2016   Procedure: KYPHOPLASTY L3;  Surgeon: Kennedy Bucker, MD;  Location: [facility] ORS;  Service: Orthopedics;  Laterality: N/A;   OPEN REDUCTION INTERNAL FIXATION (ORIF) DISTAL RADIAL FRACTURE Right 05/28/2019   Procedure: OPEN REDUCTION INTERNAL FIXATION (ORIF) DISTAL RADIAL FRACTURE;  Surgeon: Kennedy Bucker, MD;  Location: [facility] ORS;  Service: Orthopedics;  Laterality: Right;   skin cancer removed     TONSILLECTOMY       Subjective Assessment -      Subjective Pt reports on Friday his wife went out with her girlfriends, so he walked over to the neighbors house who were out on the front porch.  Pt states he was able to safely make it across the yard without any difficulty.      Pertinent History Charles Stanton is a 66yoM who was prescribed with Parkinson's Disease ~ 10 years.  Pt is followed by neurology, reports good reponse to medications which help control his tremors somewhat. Pt uses a SPC for AMB, also uses a walker for longer distance walking. Pt goes into community almost daily (they like to eat out) which includes lots of SPC [place] eand navigation of 4 stairs at home.    Currently in Pain? No/denies             TREATMENT Unless otherwise stated, CGA was provided and gait belt donned in order to ensure pt safety     Exercise/Activity Sets/Reps/Time/ Resistance Assistance Charge type Comments  Octane  3.5 min at lvl 2;  1.5 min at lvl 1.  TE Performed for aerobic priming in preparation for today's session  Seated large amplitude movements  2x10 ea   Seated lateral sidebending with contralateral arm extended in air, then shifting to opposite position  Seated upright posture with arms fully abducted to 90 deg and large amplitude movement to form a "clap" to each side  Seated hip abduction towards each side  then performing movement to opposite side to transition to sitting to the opposite side.   NMR Progression with step to improve transferred ability with car transfers   Ambulation training walking with metronome at 54 BPM for sequencing with cane   1st bout: 450 ft, 3 laps around clinic   2nd bout: ~1100 ft down to medical -  arts center and back, rest at 800 ft seated  TE Overground working on endurance at 54 BPM, challenging environment through and around hallway foot traffic and various objects.      Large amplitude movements work on postural strengthening and antigravity extension target muscles, working on functional weight shifting, targeting trunk rotation and targeting transition movements. Large amplitude movement targets bradykinesia, rigidity, and dyskinesia through targeted functional movements that address four core movement difficulties for people with Parkinson's disease.                Treatment Provided this session   Pt educated throughout session about proper posture and technique with exercises. Improved exercise technique, movement at target joints, use of target muscles after min to  mod verbal, visual, tactile cues.     PT Education -     Education Details Pt educated throughout session about proper posture and technique with exercises. Improved exercise technique, movement at target joints, use of target muscles after min to mod verbal, visual, tactile cues.      Person(s) Educated Patient    Methods Explanation;Demonstration ; handout   Comprehension Verbalized understanding;Returned demonstration;Need further instruction              PT Short Term Goals -       PT SHORT TERM GOAL #1   Title Pt to report regular performance of exercises prescribed for home and a sense of improvements in strength and balance AEB them not being as challenging anymore.    Baseline Will issue on visit 2    Time 4    Period Weeks    Status met   Target Date 03/15/22       PT SHORT TERM GOAL #2   Title Pt to demonstrate improved power AEB improved 5xSTS hands free in <18sec.    Baseline 03/01/22: 22sec hands free, author provideing foot block bilat 03/31/22: 14.82sec 4/23:17.3 sec   Time 4    Period Weeks    Status Met    Target Date 03/29/22               PT Long Term Goals - Target goal date for all remaining long term goals is 07/19/2022        PT LONG TERM GOAL #1   Title Pt to improve score on FOTO survey to 57 to indicate reduced difficulty with basic mobility required for ADL performance.    Baseline 03/01/22:49 04/29/22:56%. 06/16/22: 60   Time 8    Period Weeks    Status Met          PT LONG TERM GOAL #2   Title Pt to improve tolerance to overground AMB c SPC AEB performance >858ft.    Baseline 2/27: fatigued after 414ft lap performed in four minutes ten seconds. 03/31/22: 769ft with SPC no rest break 04/26/22: 794 ft 06/16/22: 903 ft with SPC, no rest.   Time 8    Period Weeks    Status Met         PT LONG TERM GOAL #3   Title Pt to demonstrate improved leg power AEB 5xSTS from chair <14sec hands free and without need for minGuard assist for safety and without need for foot block.    Baseline 03/01/22: 22sec hands free, feet blocked, minGuard assist  03/31/22: 14.82sec  4/23:17.3 sec 06/16/22: 13.87 sec   Time 8     Period Weeks    Status MET/ONGOING         PT LONG TERM GOAL #4   Title Pt to demonstrate improvement in balance performance AEB >6 improvement BERG Balance for balance assessment.    Baseline 41 on 03/03/22. 3/38/24: 42 4/23: 45 06/16/22: 48   Time 8    Period Weeks    Status Met          5.  Pt will improve distance with LRAD to 900 feet or greater in order to indicate further progress to community ambulation distances Baseline: 794 ft with SPC , 06/16/22: 903 ft with SPC. Goal status: Met/ongoing (continue to assess to ensure maintenance of progress)  6.  Patient will improve modified DGI (with use of cane)  score > 4 points or greater in order to indicate improved  balance and decreased risk of falls.  Baseline: 6/18: 11 Goal status: INITIAL      Plan     Clinical Impression Statement Pt continues to perform well and puts forth great effort throughout the session.  Pt noted to be able to ambulate larger distance today with one seated rest break.  Pt able to align with 54 bpm on metronome and seems to be good speed for pt.  Pt noted to be more fatigued following the session, however is making improvements with mobility and strength of the LE's.   Pt will continue to benefit from skilled therapy to address remaining deficits in order to improve overall QoL and return to PLOF.      Personal Factors and Comorbidities Time since onset of injury/illness/exacerbation;Past/Current Experience    Examination-Activity Limitations Locomotion Level;Transfers;Bed Mobility;Reach Overhead;Hygiene/Grooming;Dressing;Toileting;Stairs;Bend    Stability/Clinical Decision Making Evolving/Moderate complexity    Clinical Decision Making Moderate    Rehab Potential Good    PT Frequency 2x / week    PT Duration 8 weeks    PT Treatment/Interventions ADLs/Self Care Home Management;Moist Heat;Gait training;Stair training;Functional mobility training;Therapeutic activities;Therapeutic exercise;Balance training;Neuromuscular re-education;Patient/family education    PT Next Visit Plan .  STS PWR! Up with eventual addition of PWR! Step. Work on weight shift cues with STS. Dynamic balance/gait training as indicated.    PT Home Exercise Plan 03/01/22: discussed finding and elevated sit surface for future STS exercises (something ~18-19 inches high)    Consulted and Agree with Plan of Care Patient             Patient will benefit from skilled therapeutic intervention in order to improve the following deficits and impairments:  balance, strength, coordination, gait, transfers. Posture. ROM   Visit Diagnosis: Muscle weakness  (generalized)  Other lack of coordination  Tremor  Unsteadiness on feet  Difficulty in walking, not elsewhere classified  Other abnormalities of gait and mobility    Problem List Patient Active Problem List   Diagnosis Date Noted   Acute respiratory failure due to COVID-19 (HCC) 05/28/2019   Alcohol dependence with uncomplicated withdrawal (HCC) 01/23/2018   Alcohol withdrawal (HCC) 01/23/2018   GIB (gastrointestinal bleeding) 12/09/2017   A-fib (HCC) 12/09/2017   Esophageal obstruction 10/10/2016   Esophageal ulceration 10/10/2016   GI bleed 09/20/2016   Low vitamin B12 level 03/18/2016   S/P kyphoplasty 03/18/2016   Basal cell carcinoma 02/08/2016   Atrial fibrillation (HCC) 01/27/2016   Colitis 01/27/2016   HLD (hyperlipidemia) 01/27/2016   Anxiety 01/27/2016   Hypokalemia 01/27/2016   Major depressive disorder, recurrent episode, moderate (HCC) 04/14/2015   Anxiety, generalized 12/06/2013   Parkinson disease 08/07/2013   Dysphagia 05/12/2013   Esophageal mass 05/12/2013   GERD (gastroesophageal reflux disease) 05/12/2013   Vomiting 05/12/2013   Open bite of lower leg 05/10/2013   Dog bite of lower leg 05/10/2013   Abnormal finding on liver function 03/25/2013   Benign neoplasm 03/25/2013   Dizziness 03/25/2013   Type 2 diabetes mellitus (HCC) 03/25/2013   BP (high blood pressure) 03/25/2013   Awareness of heartbeats 03/25/2013   Pure hypercholesterolemia 03/25/2013   Infective tonsillitis 03/25/2013   Adenomatous polyp 03/25/2013       Nolon Bussing, PT, DPT Physical Therapist - [facility]  06/28/22, 1:08 PM

## 2022-06-28 NOTE — Therapy (Addendum)
OUTPATIENT OCCUPATIONAL THERAPY NEURO TREATMENT NOTE  Patient Name: Charles Stanton MRN: [id number] DOB:07/19/1955, 67 y.o., male Today's Date: 06/28/2022  PCP: Dr. Barbette Reichmann REFERRING PROVIDER: Dr. Cristopher Peru  END OF SESSION:  OT End of Session - 06/28/22 1522     Visit Number 7 (P)     Number of Visits 24 (P)     Date for OT Re-Evaluation 08/18/22 (P)     OT Start Time 1345 (P)     OT Stop Time 1430 (P)     OT Time Calculation (min) 45 min (P)     Equipment Utilized During Treatment Cane (P)     Activity Tolerance Patient tolerated treatment well (P)     Behavior During Therapy WFL for tasks assessed/performed (P)             Past Medical History:  Diagnosis Date   Abnormal liver function    Anemia    Anxiety    Atrial fibrillation (HCC)    Cancer (HCC) 2017   skin   Colonic mass    Depression    Dizziness    Dry cough    History of colon polyps    Hypercholesteremia    Hyperlipemia    Hypertension    Palpitations    Parkinson's disease    Parkinson's disease    PVD (peripheral vascular disease) (HCC)    Tonsillitis 03/25/2013   Tremors of nervous system    Past Surgical History:  Procedure Laterality Date   BACK SURGERY     Kyphoplasty   March 2018   COLONOSCOPY     COLONOSCOPY WITH PROPOFOL N/A 12/12/2017   Procedure: COLONOSCOPY WITH PROPOFOL;  Surgeon: Wyline Mood, MD;  Location: [facility] ENDOSCOPY;  Service: Gastroenterology;  Laterality: N/A;   ESOPHAGOGASTRODUODENOSCOPY N/A 12/11/2017   Procedure: ESOPHAGOGASTRODUODENOSCOPY (EGD);  Surgeon: Wyline Mood, MD;  Location: [facility] ENDOSCOPY;  Service: Gastroenterology;  Laterality: N/A;   ESOPHAGOGASTRODUODENOSCOPY N/A 10/08/2021   Procedure: ESOPHAGOGASTRODUODENOSCOPY (EGD);  Surgeon: Regis Bill, MD;  Location: [facility] ENDOSCOPY;  Service: Endoscopy;  Laterality: N/A;   ESOPHAGOGASTRODUODENOSCOPY (EGD) WITH PROPOFOL N/A 09/21/2016   Procedure: ESOPHAGOGASTRODUODENOSCOPY (EGD) WITH PROPOFOL;   Surgeon: Wyline Mood, MD;  Location: [facility] ENDOSCOPY;  Service: Gastroenterology;  Laterality: N/A;   ESOPHAGOGASTRODUODENOSCOPY (EGD) WITH PROPOFOL N/A 11/07/2016   Procedure: ESOPHAGOGASTRODUODENOSCOPY (EGD) WITH PROPOFOL;  Surgeon: Wyline Mood, MD;  Location: [facility] ENDOSCOPY;  Service: Gastroenterology;  Laterality: N/A;   ESOPHAGOGASTRODUODENOSCOPY (EGD) WITH PROPOFOL N/A 10/15/2019   Procedure: ESOPHAGOGASTRODUODENOSCOPY (EGD) WITH PROPOFOL;  Surgeon: Regis Bill, MD;  Location: [facility] ENDOSCOPY;  Service: Endoscopy;  Laterality: N/A;   ESOPHAGOGASTRODUODENOSCOPY (EGD) WITH PROPOFOL N/A 10/28/2019   Procedure: ESOPHAGOGASTRODUODENOSCOPY (EGD) WITH PROPOFOL;  Surgeon: Regis Bill, MD;  Location: [facility] ENDOSCOPY;  Service: Endoscopy;  Laterality: N/A;   ESOPHAGOGASTRODUODENOSCOPY (EGD) WITH PROPOFOL N/A 12/02/2019   Procedure: ESOPHAGOGASTRODUODENOSCOPY (EGD) WITH PROPOFOL;  Surgeon: Regis Bill, MD;  Location: [facility] ENDOSCOPY;  Service: Endoscopy;  Laterality: N/A;   ESOPHAGOGASTRODUODENOSCOPY (EGD) WITH PROPOFOL N/A 02/18/2020   Procedure: ESOPHAGOGASTRODUODENOSCOPY (EGD) WITH PROPOFOL;  Surgeon: Regis Bill, MD;  Location: [facility] ENDOSCOPY;  Service: Endoscopy;  Laterality: N/A;   ESOPHAGOGASTRODUODENOSCOPY (EGD) WITH PROPOFOL N/A 08/02/2021   Procedure: ESOPHAGOGASTRODUODENOSCOPY (EGD) WITH PROPOFOL;  Surgeon: Regis Bill, MD;  Location: [facility] ENDOSCOPY;  Service: Endoscopy;  Laterality: N/A;  REQUEST AM   ESOPHAGOGASTRODUODENOSCOPY (EGD) WITH PROPOFOL N/A 10/05/2021   Procedure: ESOPHAGOGASTRODUODENOSCOPY (EGD) WITH PROPOFOL;  Surgeon: Regis Bill, MD;  Location: [facility] ENDOSCOPY;  Service:  Endoscopy;  Laterality: N/A;   FRACTURE SURGERY Right 05/28/2019   distal radius fracture   HERNIA REPAIR     left inguinal hernia repair as an infant   KYPHOPLASTY N/A 02/11/2016   Procedure: KYPHOPLASTY  L2,T9;  Surgeon: Kennedy Bucker, MD;  Location: [facility] ORS;  Service:  Orthopedics;  Laterality: N/A;   KYPHOPLASTY N/A 03/15/2016   Procedure: KYPHOPLASTY L3;  Surgeon: Kennedy Bucker, MD;  Location: [facility] ORS;  Service: Orthopedics;  Laterality: N/A;   OPEN REDUCTION INTERNAL FIXATION (ORIF) DISTAL RADIAL FRACTURE Right 05/28/2019   Procedure: OPEN REDUCTION INTERNAL FIXATION (ORIF) DISTAL RADIAL FRACTURE;  Surgeon: Kennedy Bucker, MD;  Location: [facility] ORS;  Service: Orthopedics;  Laterality: Right;   skin cancer removed     TONSILLECTOMY     Patient Active Problem List   Diagnosis Date Noted   Acute respiratory failure due to COVID-19 (HCC) 05/28/2019   Alcohol dependence with uncomplicated withdrawal (HCC) 01/23/2018   Alcohol withdrawal (HCC) 01/23/2018   GIB (gastrointestinal bleeding) 12/09/2017   A-fib (HCC) 12/09/2017   Esophageal obstruction 10/10/2016   Esophageal ulceration 10/10/2016   GI bleed 09/20/2016   Low vitamin B12 level 03/18/2016   S/P kyphoplasty 03/18/2016   Basal cell carcinoma 02/08/2016   Atrial fibrillation (HCC) 01/27/2016   Colitis 01/27/2016   HLD (hyperlipidemia) 01/27/2016   Anxiety 01/27/2016   Hypokalemia 01/27/2016   Major depressive disorder, recurrent episode, moderate (HCC) 04/14/2015   Anxiety, generalized 12/06/2013   Parkinson disease 08/07/2013   Dysphagia 05/12/2013   Esophageal mass 05/12/2013   GERD (gastroesophageal reflux disease) 05/12/2013   Vomiting 05/12/2013   Open bite of lower leg 05/10/2013   Dog bite of lower leg 05/10/2013   Abnormal finding on liver function 03/25/2013   Benign neoplasm 03/25/2013   Dizziness 03/25/2013   Type 2 diabetes mellitus (HCC) 03/25/2013   BP (high blood pressure) 03/25/2013   Awareness of heartbeats 03/25/2013   Pure hypercholesterolemia 03/25/2013   Infective tonsillitis 03/25/2013   Adenomatous polyp 03/25/2013   ONSET DATE: 10 years   REFERRING DIAG: Parkinson's Disease  THERAPY DIAG:  Muscle weakness (generalized)  Other lack of  coordination  Rationale for Evaluation and Treatment: Rehabilitation  SUBJECTIVE:  SUBJECTIVE STATEMENT: Pt reports being tired from PT session prior to OT. Pt. Reports walking over a friends this weekend for a social gathering. Pt accompanied by: self; spouse present in lobby, but did not follow for eval  PERTINENT HISTORY: Pt has been working with outpatient PT here at [facility] since end of Feb 2024.  OT ordered d/t worsening PD symptoms contributing to functional decline with ADLs.   PRECAUTIONS: Fall  WEIGHT BEARING RESTRICTIONS: No  PAIN:  Are you having pain? No  FALLS: Has patient fallen in last 6 months? Yes. Number of falls 1  LIVING ENVIRONMENT: Lives with: lives with their spouse Lives in: split level but pt remains on main level to avoid steps Stairs: Yes: Internal: 2 steps; can reach both Has following equipment at home: Quad cane small base, Walker - 4 wheeled, bed side commode, Grab bars, and pull down seat for shower, hand held shower hose,   PLOF: Needs assistance with ADLs; pt reports that spouse assists but is consistent to always let pt try things first before she automatically helps.    PATIENT GOALS: To be more indep  OBJECTIVE:  HAND DOMINANCE: Right (most affected side from PD)  ADLs: Overall ADLs: spouse is primary caregiver Transfers/ambulation related to ADLs: uses RW Eating: spouse cuts  food; pt reports having to use L non-dominant hand to eat for the last 5-6 yrs Grooming: set up; assist to squeeze toothpaste and denture paste; uses L non-dominant hand UB Dressing: set up; assist with clothing fasteners LB Dressing: Min A; assist with socks, dons slip on shoes with set up (has tried a sockaid but was unsuccessful) Toileting: modified indep Bathing: Set up/min A; spouse squeezes shampoo into pt's hands, spouse helps to towel dry  Tub Shower transfers: distant supv Equipment:  see above   IADLs: Shopping: pt can accompany spouse by pushing a  shopping cart.  Light housekeeping: pt can drag trash to bin and can roll the bin to the road and back Meal Prep: spouse manages  Community mobility: spouse drives; uses rollator for longer distances  Medication management: spouse manages pills using weekly pill organizer.   Financial management: spouse manages  Handwriting: Mild micrographia and <25% legible  MOBILITY STATUS: Hx of falls  POSTURE COMMENTS:  rounded shoulders and forward head, R shoulder elevated  ACTIVITY TOLERANCE: Activity tolerance: TBD and assessed further with functional activities   FUNCTIONAL OUTCOME MEASURES: FOTO: TBD   UPPER EXTREMITY ROM:    Active ROM Right eval Left eval  Shoulder flexion 118 85  Shoulder abduction 128 124  Shoulder adduction    Shoulder extension    Shoulder internal rotation [facility] WFL  Shoulder external rotation [facility] WFL  Elbow flexion    Elbow extension    Wrist flexion 55 76  Wrist extension 57 75  Wrist ulnar deviation    Wrist radial deviation    Wrist pronation [facility] WFL  Wrist supination 63 75  (Blank rows = not tested)  UPPER EXTREMITY MMT:     MMT Right eval Left eval  Shoulder flexion 4 4  Shoulder abduction 4+ 4+  Shoulder adduction    Shoulder extension    Shoulder internal rotation    Shoulder external rotation    Middle trapezius    Lower trapezius    Elbow flexion 4+ 4+  Elbow extension 4+ 4+  Wrist flexion 4+ 4+  Wrist extension 4+ 4+  Wrist ulnar deviation    Wrist radial deviation    Wrist pronation    Wrist supination    (Blank rows = not tested)  HAND FUNCTION: Grip strength: Right: 16 lbs; Left: 38 lbs, Lateral pinch: Right: 10 lbs, Left: 14 lbs, and 3 point pinch: Right: 9 lbs, Left: 11 lbs  COORDINATION: 9 Hole Peg test: Right: 38 sec; Left: 40 sec  SENSATION: WFL  EDEMA: None  MUSCLE TONE: R/L normal  COGNITION: Overall cognitive status: Within functional limits for tasks assessed  VISION: Subjective report: wears  glasses all the time    PRAXIS: Impaired: Motor planning  OBSERVATIONS:  R SF PIP flexion contracture 105* from 5-6 years ago from a fall  TODAY'S TREATMENT:  DATE: 06/28/22:  Therapeutic Exercise:  Facilitated hand strengthening with use of a hand gripper set at 6.6# on the R and 11.2# on the L# to remove jumbo pegs from pegboard x2 trials each hand. Intermittent min vc and tactile cues for set up of hand gripper in hand to maximize closure with each gripping rep. Facilitated lateral, and 3pt. Pinch strengthening with use of  yellow, red, green, and blue therapy resistant clips using his R/L hands to place them onto dowel located in front of them. Pt. Removed yellow, red, green and blue resistive clips using lateral and 3pt. Pinch.  Neuromuscular re-education:  Pt. worked on [facility] skills grasping 1/2" flat marbles, and storing them in the palm of his hand. Pt. Worked on moving them from his palm to the tip of his 2nd digit, and thumb in preparation for discarding them  onto the top of the container. At times Pt. Discarded them from the ulnar aspect of his palm.  PATIENT EDUCATION: Education details: Potential benefit of wrist weight to minimize UE tremor Person educated: Patient Education method: Explanation and Verbal cues, demo Education comprehension: verbalized understanding, demonstrated understanding  HOME EXERCISE PROGRAM: Theraputty   GOALS: Goals reviewed with patient? Yes  SHORT TERM GOALS: Target date: 07/07/22 (6 weeks)  Pt will be indep to perform HEP for improving bilat hand strength and coordination skills. Baseline: Eval: not yet initiated Goal status: INITIAL  LONG TERM GOALS: Target date: 08/18/22 (12 weeks)  Pt will increase FOTO score to (TBD) or better to indicate improvement in self perceived functional use of the R arm with daily  tasks. Baseline: Eval: TBD Goal status: INITIAL  2.  Pt will increase R grip strength by 5 or more lbs to more easily hold and stabilize ADL supplies in the R dominant hand. Baseline: R grip 16 lbs; limited from PD but also by 5th digit PIP flexion contracture, as this digit can not engage in gripping (L 38 lbs) Goal status: INITIAL  3.  Pt will improve R hand FMC/dexterity skills to be able to sign his name on medical/legal documents with 75% legibility, using adapted writing aids as needed. Baseline: Eval: <25% legible and pt verbalizes embarrassment when attempting to sign his name on forms Goal status: INITIAL  4.  Pt will increase R FMC/GMC skills to engage the RUE into UB ADLs at least 50% of the time. Baseline: Eval: Pt uses the L non-dominant arm for self care tasks. Goal status: INITIAL  ASSESSMENT:  CLINICAL IMPRESSION:  Good tolerance to therapeutic exercises this date. Pt tolerated 6.6# for 3 trials on the R side  and 11.2# for 3 trials on the L using the hand grip strengthener to remove jumbo pegs from pegboard. Pt continues to present with decreased on the R d/t R SF flexion contracture preventing this digit from engaging in gripping on this hand. Pt dropped a total of 2 pegs with R hand and 2 pegs with L hand. Pt required cues, and assist for visual demonstration for [facility] skills. Pt required frequent rest breaks with gripping and pinching activities due to weakness in the hands, and intermittent tactile and vc for grip strengthening, pinch strengthening,  and FMC skills,/prehension patterns.  Pt. Was able to pick up 1/2" flat marbles using tip pinch with the thumb and 2nd digit. Pt. Was able to store 4 marbles total within the palm of his hand followed by translatory movements to move the marble from the palm to the 2nd digit before discarding.  Pt. Required increased verbal cues to ensure translatory movements were being used when moving the marbles. Pt. Was able to use lateral and  3 point pinch to place resistive clips onto dowel however required frequent breaks throughout. Pt. continues to benefit from skilled OT for increasing bilateral hand strength, improve coordination, specifically working to increase engagement of the RUE during self care tasks.    PERFORMANCE DEFICITS: in functional skills including ADLs, IADLs, coordination, dexterity, ROM, strength, flexibility, Fine motor control, Gross motor control, mobility, balance, body mechanics, endurance, decreased knowledge of use of DME, and UE functional use, cognitive skills including emotional and memory, and psychosocial skills including coping strategies, environmental adaptation, habits, and routines and behaviors.   IMPAIRMENTS: are limiting patient from ADLs, IADLs, and leisure.   CO-MORBIDITIES: has co-morbidities such as anxiety, depression  that affects occupational performance. Patient will benefit from skilled OT to address above impairments and improve overall function.  MODIFICATION OR ASSISTANCE TO COMPLETE EVALUATION: No modification of tasks or assist necessary to complete an evaluation.  OT OCCUPATIONAL PROFILE AND HISTORY: Problem focused assessment: Including review of records relating to presenting problem.  CLINICAL DECISION MAKING: Moderate - several treatment options, min-mod task modification necessary  REHAB POTENTIAL: Good for goals  EVALUATION COMPLEXITY: Moderate    PLAN:  OT FREQUENCY: 2x/week  OT DURATION: 12 weeks  PLANNED INTERVENTIONS: self care/ADL training, therapeutic exercise, therapeutic activity, neuromuscular re-education, manual therapy, passive range of motion, balance training, functional mobility training, moist heat, cryotherapy, patient/family education, cognitive remediation/compensation, psychosocial skills training, energy conservation, coping strategies training, and DME and/or AE instructions  RECOMMENDED OTHER SERVICES: None at this time  CONSULTED AND  AGREED WITH PLAN OF CARE: Patient  PLAN FOR NEXT SESSION: initiate HEP, complete FOTO  Giuliano Preece, OTS, 3:32 PM  This entire session was performed under the direct supervision and direction of a licensed therapist. I have personally read, edited, and approve of the note as written.   Olegario Messier, MS, OTR/L  06/28/2022

## 2022-06-30 ENCOUNTER — Ambulatory Visit: Payer: Medicare Other

## 2022-06-30 ENCOUNTER — Ambulatory Visit: Payer: Medicare Other | Admitting: Occupational Therapy

## 2022-06-30 DIAGNOSIS — R251 Tremor, unspecified: Secondary | ICD-10-CM

## 2022-06-30 DIAGNOSIS — R278 Other lack of coordination: Secondary | ICD-10-CM

## 2022-06-30 DIAGNOSIS — M6281 Muscle weakness (generalized): Secondary | ICD-10-CM | POA: Diagnosis not present

## 2022-06-30 DIAGNOSIS — R2681 Unsteadiness on feet: Secondary | ICD-10-CM

## 2022-06-30 DIAGNOSIS — R2689 Other abnormalities of gait and mobility: Secondary | ICD-10-CM

## 2022-06-30 DIAGNOSIS — H543 Unqualified visual loss, both eyes: Secondary | ICD-10-CM

## 2022-06-30 DIAGNOSIS — R262 Difficulty in walking, not elsewhere classified: Secondary | ICD-10-CM

## 2022-06-30 NOTE — Therapy (Signed)
OUTPATIENT OCCUPATIONAL THERAPY NEURO TREATMENT NOTE  Patient Name: Charles Stanton MRN: [id number] DOB:1955-05-21, 67 y.o., male Today's Date: 06/30/2022  PCP: Dr. Barbette Reichmann REFERRING PROVIDER: Dr. Cristopher Peru  END OF SESSION:  OT End of Session - 06/30/22 1447     Visit Number 8    Number of Visits 24    Date for OT Re-Evaluation 08/18/22    OT Start Time 1345    OT Stop Time 1430    OT Time Calculation (min) 45 min    Activity Tolerance Patient tolerated treatment well    Behavior During Therapy [facility] for tasks assessed/performed            Past Medical History:  Diagnosis Date   Abnormal liver function    Anemia    Anxiety    Atrial fibrillation (HCC)    Cancer (HCC) 2017   skin   Colonic mass    Depression    Dizziness    Dry cough    History of colon polyps    Hypercholesteremia    Hyperlipemia    Hypertension    Palpitations    Parkinson's disease    Parkinson's disease    PVD (peripheral vascular disease) (HCC)    Tonsillitis 03/25/2013   Tremors of nervous system    Past Surgical History:  Procedure Laterality Date   BACK SURGERY     Kyphoplasty   March 2018   COLONOSCOPY     COLONOSCOPY WITH PROPOFOL N/A 12/12/2017   Procedure: COLONOSCOPY WITH PROPOFOL;  Surgeon: Wyline Mood, MD;  Location: [facility] ENDOSCOPY;  Service: Gastroenterology;  Laterality: N/A;   ESOPHAGOGASTRODUODENOSCOPY N/A 12/11/2017   Procedure: ESOPHAGOGASTRODUODENOSCOPY (EGD);  Surgeon: Wyline Mood, MD;  Location: [facility] ENDOSCOPY;  Service: Gastroenterology;  Laterality: N/A;   ESOPHAGOGASTRODUODENOSCOPY N/A 10/08/2021   Procedure: ESOPHAGOGASTRODUODENOSCOPY (EGD);  Surgeon: Regis Bill, MD;  Location: [facility] ENDOSCOPY;  Service: Endoscopy;  Laterality: N/A;   ESOPHAGOGASTRODUODENOSCOPY (EGD) WITH PROPOFOL N/A 09/21/2016   Procedure: ESOPHAGOGASTRODUODENOSCOPY (EGD) WITH PROPOFOL;  Surgeon: Wyline Mood, MD;  Location: [facility] ENDOSCOPY;  Service: Gastroenterology;  Laterality:  N/A;   ESOPHAGOGASTRODUODENOSCOPY (EGD) WITH PROPOFOL N/A 11/07/2016   Procedure: ESOPHAGOGASTRODUODENOSCOPY (EGD) WITH PROPOFOL;  Surgeon: Wyline Mood, MD;  Location: [facility] ENDOSCOPY;  Service: Gastroenterology;  Laterality: N/A;   ESOPHAGOGASTRODUODENOSCOPY (EGD) WITH PROPOFOL N/A 10/15/2019   Procedure: ESOPHAGOGASTRODUODENOSCOPY (EGD) WITH PROPOFOL;  Surgeon: Regis Bill, MD;  Location: [facility] ENDOSCOPY;  Service: Endoscopy;  Laterality: N/A;   ESOPHAGOGASTRODUODENOSCOPY (EGD) WITH PROPOFOL N/A 10/28/2019   Procedure: ESOPHAGOGASTRODUODENOSCOPY (EGD) WITH PROPOFOL;  Surgeon: Regis Bill, MD;  Location: [facility] ENDOSCOPY;  Service: Endoscopy;  Laterality: N/A;   ESOPHAGOGASTRODUODENOSCOPY (EGD) WITH PROPOFOL N/A 12/02/2019   Procedure: ESOPHAGOGASTRODUODENOSCOPY (EGD) WITH PROPOFOL;  Surgeon: Regis Bill, MD;  Location: [facility] ENDOSCOPY;  Service: Endoscopy;  Laterality: N/A;   ESOPHAGOGASTRODUODENOSCOPY (EGD) WITH PROPOFOL N/A 02/18/2020   Procedure: ESOPHAGOGASTRODUODENOSCOPY (EGD) WITH PROPOFOL;  Surgeon: Regis Bill, MD;  Location: [facility] ENDOSCOPY;  Service: Endoscopy;  Laterality: N/A;   ESOPHAGOGASTRODUODENOSCOPY (EGD) WITH PROPOFOL N/A 08/02/2021   Procedure: ESOPHAGOGASTRODUODENOSCOPY (EGD) WITH PROPOFOL;  Surgeon: Regis Bill, MD;  Location: [facility] ENDOSCOPY;  Service: Endoscopy;  Laterality: N/A;  REQUEST AM   ESOPHAGOGASTRODUODENOSCOPY (EGD) WITH PROPOFOL N/A 10/05/2021   Procedure: ESOPHAGOGASTRODUODENOSCOPY (EGD) WITH PROPOFOL;  Surgeon: Regis Bill, MD;  Location: [facility] ENDOSCOPY;  Service: Endoscopy;  Laterality: N/A;   FRACTURE SURGERY Right 05/28/2019   distal radius fracture   HERNIA REPAIR     left inguinal hernia  repair as an infant   KYPHOPLASTY N/A 02/11/2016   Procedure: KYPHOPLASTY  L2,T9;  Surgeon: Kennedy Bucker, MD;  Location: [facility] ORS;  Service: Orthopedics;  Laterality: N/A;   KYPHOPLASTY N/A 03/15/2016   Procedure: KYPHOPLASTY L3;   Surgeon: Kennedy Bucker, MD;  Location: [facility] ORS;  Service: Orthopedics;  Laterality: N/A;   OPEN REDUCTION INTERNAL FIXATION (ORIF) DISTAL RADIAL FRACTURE Right 05/28/2019   Procedure: OPEN REDUCTION INTERNAL FIXATION (ORIF) DISTAL RADIAL FRACTURE;  Surgeon: Kennedy Bucker, MD;  Location: [facility] ORS;  Service: Orthopedics;  Laterality: Right;   skin cancer removed     TONSILLECTOMY     Patient Active Problem List   Diagnosis Date Noted   Acute respiratory failure due to COVID-19 (HCC) 05/28/2019   Alcohol dependence with uncomplicated withdrawal (HCC) 01/23/2018   Alcohol withdrawal (HCC) 01/23/2018   GIB (gastrointestinal bleeding) 12/09/2017   A-fib (HCC) 12/09/2017   Esophageal obstruction 10/10/2016   Esophageal ulceration 10/10/2016   GI bleed 09/20/2016   Low vitamin B12 level 03/18/2016   S/P kyphoplasty 03/18/2016   Basal cell carcinoma 02/08/2016   Atrial fibrillation (HCC) 01/27/2016   Colitis 01/27/2016   HLD (hyperlipidemia) 01/27/2016   Anxiety 01/27/2016   Hypokalemia 01/27/2016   Major depressive disorder, recurrent episode, moderate (HCC) 04/14/2015   Anxiety, generalized 12/06/2013   Parkinson disease 08/07/2013   Dysphagia 05/12/2013   Esophageal mass 05/12/2013   GERD (gastroesophageal reflux disease) 05/12/2013   Vomiting 05/12/2013   Open bite of lower leg 05/10/2013   Dog bite of lower leg 05/10/2013   Abnormal finding on liver function 03/25/2013   Benign neoplasm 03/25/2013   Dizziness 03/25/2013   Type 2 diabetes mellitus (HCC) 03/25/2013   BP (high blood pressure) 03/25/2013   Awareness of heartbeats 03/25/2013   Pure hypercholesterolemia 03/25/2013   Infective tonsillitis 03/25/2013   Adenomatous polyp 03/25/2013   ONSET DATE: 10 years   REFERRING DIAG: Parkinson's Disease  THERAPY DIAG:  Low vision, both eyes  Muscle weakness (generalized)  Other lack of coordination  Rationale for Evaluation and Treatment: Rehabilitation  SUBJECTIVE:   SUBJECTIVE STATEMENT: Pt reports being tired from PT session prior to OT. Pt. Reports walking over a friends this weekend for a social gathering. Pt accompanied by: self; spouse present in lobby, but did not follow for eval  PERTINENT HISTORY: Pt has been working with outpatient PT here at [facility] since end of Feb 2024.  OT ordered d/t worsening PD symptoms contributing to functional decline with ADLs.   PRECAUTIONS: Fall  WEIGHT BEARING RESTRICTIONS: No  PAIN:  Are you having pain? No  FALLS: Has patient fallen in last 6 months? Yes. Number of falls 1  LIVING ENVIRONMENT: Lives with: lives with their spouse Lives in: split level but pt remains on main level to avoid steps Stairs: Yes: Internal: 2 steps; can reach both Has following equipment at home: Quad cane small base, Walker - 4 wheeled, bed side commode, Grab bars, and pull down seat for shower, hand held shower hose,   PLOF: Needs assistance with ADLs; pt reports that spouse assists but is consistent to always let pt try things first before she automatically helps.    PATIENT GOALS: To be more indep  OBJECTIVE:  HAND DOMINANCE: Right (most affected side from PD)  ADLs: Overall ADLs: spouse is primary caregiver Transfers/ambulation related to ADLs: uses RW Eating: spouse cuts food; pt reports having to use L non-dominant hand to eat for the last 5-6 yrs Grooming: set up; assist to  squeeze toothpaste and denture paste; uses L non-dominant hand UB Dressing: set up; assist with clothing fasteners LB Dressing: Min A; assist with socks, dons slip on shoes with set up (has tried a sockaid but was unsuccessful) Toileting: modified indep Bathing: Set up/min A; spouse squeezes shampoo into pt's hands, spouse helps to towel dry  Tub Shower transfers: distant supv Equipment:  see above   IADLs: Shopping: pt can accompany spouse by pushing a shopping cart.  Light housekeeping: pt can drag trash to bin and can roll the bin to the  road and back Meal Prep: spouse manages  Community mobility: spouse drives; uses rollator for longer distances  Medication management: spouse manages pills using weekly pill organizer.   Financial management: spouse manages  Handwriting: Mild micrographia and <25% legible  MOBILITY STATUS: Hx of falls  POSTURE COMMENTS:  rounded shoulders and forward head, R shoulder elevated  ACTIVITY TOLERANCE: Activity tolerance: TBD and assessed further with functional activities   FUNCTIONAL OUTCOME MEASURES: FOTO: TBD   UPPER EXTREMITY ROM:    Active ROM Right eval Left eval  Shoulder flexion 118 85  Shoulder abduction 128 124  Shoulder adduction    Shoulder extension    Shoulder internal rotation [facility] WFL  Shoulder external rotation [facility] WFL  Elbow flexion    Elbow extension    Wrist flexion 55 76  Wrist extension 57 75  Wrist ulnar deviation    Wrist radial deviation    Wrist pronation [facility] WFL  Wrist supination 63 75  (Blank rows = not tested)  UPPER EXTREMITY MMT:     MMT Right eval Left eval  Shoulder flexion 4 4  Shoulder abduction 4+ 4+  Shoulder adduction    Shoulder extension    Shoulder internal rotation    Shoulder external rotation    Middle trapezius    Lower trapezius    Elbow flexion 4+ 4+  Elbow extension 4+ 4+  Wrist flexion 4+ 4+  Wrist extension 4+ 4+  Wrist ulnar deviation    Wrist radial deviation    Wrist pronation    Wrist supination    (Blank rows = not tested)  HAND FUNCTION: Grip strength: Right: 16 lbs; Left: 38 lbs, Lateral pinch: Right: 10 lbs, Left: 14 lbs, and 3 point pinch: Right: 9 lbs, Left: 11 lbs  COORDINATION: 9 Hole Peg test: Right: 38 sec; Left: 40 sec  SENSATION: WFL  EDEMA: None  MUSCLE TONE: R/L normal  COGNITION: Overall cognitive status: Within functional limits for tasks assessed  VISION: Subjective report: wears glasses all the time    PRAXIS: Impaired: Motor planning  OBSERVATIONS:  R SF PIP flexion  contracture 105* from 5-6 years ago from a fall  TODAY'S TREATMENT:                                                                                                                              DATE: 06/30/22:  Therapeutic Exercise:  Pt. Worked on BUE strengthening, and reciprocal motion using the UBE while seated for 8 min. with no resistance. Constant monitoring was provided. Facilitated hand strengthening with use of a hand gripper set at 6.6# on the R and 11.2# on the L# to remove jumbo pegs from pegboard. Intermittent min vc and tactile cues for set up of hand gripper in hand to maximize closure with each gripping rep.   Neuromuscular re-education:  Pt. worked on opening multiple types, and sizes of medication bottle lids using Dycem to stabilize the base of each medication  bottle.  PATIENT EDUCATION: Education details: Potential benefit of wrist weight to minimize UE tremor Person educated: Patient Education method: Explanation and Verbal cues, demo Education comprehension: verbalized understanding, demonstrated understanding  HOME EXERCISE PROGRAM: Theraputty   GOALS: Goals reviewed with patient? Yes  SHORT TERM GOALS: Target date: 07/07/22 (6 weeks)  Pt will be indep to perform HEP for improving bilat hand strength and coordination skills. Baseline: Eval: not yet initiated Goal status: INITIAL  LONG TERM GOALS: Target date: 08/18/22 (12 weeks)  Pt will increase FOTO score to (TBD) or better to indicate improvement in self perceived functional use of the R arm with daily tasks. Baseline: Eval: TBD Goal status: INITIAL  2.  Pt will increase R grip strength by 5 or more lbs to more easily hold and stabilize ADL supplies in the R dominant hand. Baseline: R grip 16 lbs; limited from PD but also by 5th digit PIP flexion contracture, as this digit can not engage in gripping (L 38 lbs) Goal status: INITIAL  3.  Pt will improve R hand FMC/dexterity skills to be able to sign his  name on medical/legal documents with 75% legibility, using adapted writing aids as needed. Baseline: Eval: <25% legible and pt verbalizes embarrassment when attempting to sign his name on forms Goal status: INITIAL  4.  Pt will increase R FMC/GMC skills to engage the RUE into UB ADLs at least 50% of the time. Baseline: Eval: Pt uses the L non-dominant arm for self care tasks. Goal status: INITIAL  ASSESSMENT:  CLINICAL IMPRESSION:  Pt. tolerated therapeutic exercises well this date.  Pt. tolerated the UE UBE, however fatigued at 8 min. Pt tolerated 6.6#  on the R side  and 11.2# on the L using the hand grip strengthener to remove jumbo pegs from pegboard. Pt continues to present with decreased  grip on the R d/t R SF flexion contracture preventing this digit from engaging in gripping on this hand Pt required cues, and assist for visual demonstration for [facility] skills. Pt. was able to stabilize the simulated medication bottles on the Dycem while opening them. Pt. was able to manage opening several types of medication bottles. Pt. Was provided with some Dycem for home use. Pt. continues to benefit from skilled OT for increasing bilateral hand strength, improve coordination, specifically working to increase engagement of the RUE during self care tasks.    PERFORMANCE DEFICITS: in functional skills including ADLs, IADLs, coordination, dexterity, ROM, strength, flexibility, Fine motor control, Gross motor control, mobility, balance, body mechanics, endurance, decreased knowledge of use of DME, and UE functional use, cognitive skills including emotional and memory, and psychosocial skills including coping strategies, environmental adaptation, habits, and routines and behaviors.   IMPAIRMENTS: are limiting patient from ADLs, IADLs, and leisure.   CO-MORBIDITIES: has co-morbidities such as anxiety, depression  that affects occupational performance. Patient will benefit from skilled OT to address above  impairments and improve overall function.  MODIFICATION OR ASSISTANCE TO COMPLETE EVALUATION: No modification of tasks or assist necessary to complete an evaluation.  OT OCCUPATIONAL PROFILE AND HISTORY: Problem focused assessment: Including review of records relating to presenting problem.  CLINICAL DECISION MAKING: Moderate - several treatment options, min-mod task modification necessary  REHAB POTENTIAL: Good for goals  EVALUATION COMPLEXITY: Moderate    PLAN:  OT FREQUENCY: 2x/week  OT DURATION: 12 weeks  PLANNED INTERVENTIONS: self care/ADL training, therapeutic exercise, therapeutic activity, neuromuscular re-education, manual therapy, passive range of motion, balance training, functional mobility training, moist heat, cryotherapy, patient/family education, cognitive remediation/compensation, psychosocial skills training, energy conservation, coping strategies training, and DME and/or AE instructions  RECOMMENDED OTHER SERVICES: None at this time  CONSULTED AND AGREED WITH PLAN OF CARE: Patient  PLAN FOR NEXT SESSION: initiate HEP, complete Darrel Hoover, MS, OTR/L  06/30/2022

## 2022-06-30 NOTE — Therapy (Signed)
Outpatient Physical Therapy Treatment    Patient Details  Name: Charles Stanton MRN: [id number] Date of Birth: 27-Oct-1955 Referring Provider (PT): Dr. Cristopher Peru   Encounter Date: 06/30/2022  END OF SESSION:    PT End of Session - 06/30/22 1304     Visit Number 34    Number of Visits 40    Date for PT Re-Evaluation 07/19/22    Authorization Type Medicare A & B primary; federal employee secondary    Authorization Time Period -07/19/22    Progress Note Due on Visit 30    PT Start Time 1304    PT Stop Time 1345    PT Time Calculation (min) 41 min    Equipment Utilized During Treatment Gait belt    Activity Tolerance Patient tolerated treatment well;No increased pain    Behavior During Therapy [facility] for tasks assessed/performed                Past Medical History:  Diagnosis Date   Abnormal liver function    Anemia    Anxiety    Atrial fibrillation (HCC)    Cancer (HCC) 2017   skin   Colonic mass    Depression    Dizziness    Dry cough    History of colon polyps    Hypercholesteremia    Hyperlipemia    Hypertension    Palpitations    Parkinson's disease    Parkinson's disease    PVD (peripheral vascular disease) (HCC)    Tonsillitis 03/25/2013   Tremors of nervous system     Past Surgical History:  Procedure Laterality Date   BACK SURGERY     Kyphoplasty   March 2018   COLONOSCOPY     COLONOSCOPY WITH PROPOFOL N/A 12/12/2017   Procedure: COLONOSCOPY WITH PROPOFOL;  Surgeon: Wyline Mood, MD;  Location: [facility] ENDOSCOPY;  Service: Gastroenterology;  Laterality: N/A;   ESOPHAGOGASTRODUODENOSCOPY N/A 12/11/2017   Procedure: ESOPHAGOGASTRODUODENOSCOPY (EGD);  Surgeon: Wyline Mood, MD;  Location: [facility] ENDOSCOPY;  Service: Gastroenterology;  Laterality: N/A;   ESOPHAGOGASTRODUODENOSCOPY N/A 10/08/2021   Procedure: ESOPHAGOGASTRODUODENOSCOPY (EGD);  Surgeon: Regis Bill, MD;  Location: [facility] ENDOSCOPY;  Service: Endoscopy;  Laterality: N/A;    ESOPHAGOGASTRODUODENOSCOPY (EGD) WITH PROPOFOL N/A 09/21/2016   Procedure: ESOPHAGOGASTRODUODENOSCOPY (EGD) WITH PROPOFOL;  Surgeon: Wyline Mood, MD;  Location: [facility] ENDOSCOPY;  Service: Gastroenterology;  Laterality: N/A;   ESOPHAGOGASTRODUODENOSCOPY (EGD) WITH PROPOFOL N/A 11/07/2016   Procedure: ESOPHAGOGASTRODUODENOSCOPY (EGD) WITH PROPOFOL;  Surgeon: Wyline Mood, MD;  Location: [facility] ENDOSCOPY;  Service: Gastroenterology;  Laterality: N/A;   ESOPHAGOGASTRODUODENOSCOPY (EGD) WITH PROPOFOL N/A 10/15/2019   Procedure: ESOPHAGOGASTRODUODENOSCOPY (EGD) WITH PROPOFOL;  Surgeon: Regis Bill, MD;  Location: [facility] ENDOSCOPY;  Service: Endoscopy;  Laterality: N/A;   ESOPHAGOGASTRODUODENOSCOPY (EGD) WITH PROPOFOL N/A 10/28/2019   Procedure: ESOPHAGOGASTRODUODENOSCOPY (EGD) WITH PROPOFOL;  Surgeon: Regis Bill, MD;  Location: [facility] ENDOSCOPY;  Service: Endoscopy;  Laterality: N/A;   ESOPHAGOGASTRODUODENOSCOPY (EGD) WITH PROPOFOL N/A 12/02/2019   Procedure: ESOPHAGOGASTRODUODENOSCOPY (EGD) WITH PROPOFOL;  Surgeon: Regis Bill, MD;  Location: [facility] ENDOSCOPY;  Service: Endoscopy;  Laterality: N/A;   ESOPHAGOGASTRODUODENOSCOPY (EGD) WITH PROPOFOL N/A 02/18/2020   Procedure: ESOPHAGOGASTRODUODENOSCOPY (EGD) WITH PROPOFOL;  Surgeon: Regis Bill, MD;  Location: [facility] ENDOSCOPY;  Service: Endoscopy;  Laterality: N/A;   ESOPHAGOGASTRODUODENOSCOPY (EGD) WITH PROPOFOL N/A 08/02/2021   Procedure: ESOPHAGOGASTRODUODENOSCOPY (EGD) WITH PROPOFOL;  Surgeon: Regis Bill, MD;  Location: [facility] ENDOSCOPY;  Service: Endoscopy;  Laterality: N/A;  REQUEST AM   ESOPHAGOGASTRODUODENOSCOPY (EGD) WITH  PROPOFOL N/A 10/05/2021   Procedure: ESOPHAGOGASTRODUODENOSCOPY (EGD) WITH PROPOFOL;  Surgeon: Regis Bill, MD;  Location: [facility] ENDOSCOPY;  Service: Endoscopy;  Laterality: N/A;   FRACTURE SURGERY Right 05/28/2019   distal radius fracture   HERNIA REPAIR     left inguinal hernia repair as an  infant   KYPHOPLASTY N/A 02/11/2016   Procedure: KYPHOPLASTY  L2,T9;  Surgeon: Kennedy Bucker, MD;  Location: [facility] ORS;  Service: Orthopedics;  Laterality: N/A;   KYPHOPLASTY N/A 03/15/2016   Procedure: KYPHOPLASTY L3;  Surgeon: Kennedy Bucker, MD;  Location: [facility] ORS;  Service: Orthopedics;  Laterality: N/A;   OPEN REDUCTION INTERNAL FIXATION (ORIF) DISTAL RADIAL FRACTURE Right 05/28/2019   Procedure: OPEN REDUCTION INTERNAL FIXATION (ORIF) DISTAL RADIAL FRACTURE;  Surgeon: Kennedy Bucker, MD;  Location: [facility] ORS;  Service: Orthopedics;  Laterality: Right;   skin cancer removed     TONSILLECTOMY       Subjective Assessment -      Subjective Pt reports no new complaints upon arrival to the clinic.  Pt notes he is doing well today.    Pertinent History Charles Stanton is a 66yoM who was prescribed with Parkinson's Disease ~ 10 years.  Pt is followed by neurology, reports good reponse to medications which help control his tremors somewhat. Pt uses a SPC for AMB, also uses a walker for longer distance walking. Pt goes into community almost daily (they like to eat out) which includes lots of SPC [place] eand navigation of 4 stairs at home.    Currently in Pain? No/denies             TREATMENT Unless otherwise stated, CGA was provided and gait belt donned in order to ensure pt safety     Exercise/Activity Sets/Reps/Time/ Resistance Assistance Charge type Comments  Octane  3.5 min at lvl 2;  1.5 min at lvl 1.  TE Performed for aerobic priming in preparation for today's session  Seated large amplitude movements  2x10 ea   Seated lateral sidebending with contralateral arm extended in air, then shifting to opposite position  Seated upright posture with arms fully abducted to 90 deg and large amplitude movement to form a "clap" to each side, while wearing 1.5# AW on wrists  Seated hip abduction towards each side then performing movement to opposite side to transition to sitting to the opposite side.   NMR  Progression with step to improve transferred ability with car transfers   Ambulation training walking with metronome at 54 BPM for sequencing with cane    ~1100 ft down to [facility] and back, one standing rest break this attempt, but no seated breaks utilized  TE Overground working on endurance at 54 BPM, challenging environment through and around hallway foot traffic and various objects.      Large amplitude movements work on postural strengthening and antigravity extension target muscles, working on functional weight shifting, targeting trunk rotation and targeting transition movements. Large amplitude movement targets bradykinesia, rigidity, and dyskinesia through targeted functional movements that address four core movement difficulties for people with Parkinson's disease.  Standing speed bag punches, 1.5# cuff weights on each UE 2x10 hits with each UE  TE   Standing speed bag punches while circling around the bag, 1.5# cuff weights on each UE   2x10 hits with each UE  NMR Sidestepping around the bag for increased challenge of hitting bag while moving laterally  Seated cross body punches with therapist swinging over top (pt ducking from therapist  swing) 1.5# cuff weights on each UE 2x10  TE Recruiting core for ducking and UE's for swings.    Treatment Provided this session   Pt educated throughout session about proper posture and technique with exercises. Improved exercise technique, movement at target joints, use of target muscles after min to mod verbal, visual, tactile cues.     PT Education -     Education Details Pt educated throughout session about proper posture and technique with exercises. Improved exercise technique, movement at target joints, use of target muscles after min to mod verbal, visual, tactile cues.      Person(s) Educated Patient    Methods Explanation;Demonstration ; handout   Comprehension Verbalized understanding;Returned demonstration;Need further  instruction           PT Short Term Goals -       PT SHORT TERM GOAL #1   Title Pt to report regular performance of exercises prescribed for home and a sense of improvements in strength and balance AEB them not being as challenging anymore.    Baseline Will issue on visit 2    Time 4    Period Weeks    Status met   Target Date 03/15/22      PT SHORT TERM GOAL #2   Title Pt to demonstrate improved power AEB improved 5xSTS hands free in <18sec.    Baseline 03/01/22: 22sec hands free, author provideing foot block bilat 03/31/22: 14.82sec 4/23:17.3 sec   Time 4    Period Weeks    Status Met    Target Date 03/29/22               PT Long Term Goals - Target goal date for all remaining long term goals is 07/19/2022        PT LONG TERM GOAL #1   Title Pt to improve score on FOTO survey to 57 to indicate reduced difficulty with basic mobility required for ADL performance.    Baseline 03/01/22:49 04/29/22:56%. 06/16/22: 60   Time 8    Period Weeks    Status Met          PT LONG TERM GOAL #2   Title Pt to improve tolerance to overground AMB c SPC AEB performance >817ft.    Baseline 2/27: fatigued after 437ft lap performed in four minutes ten seconds. 03/31/22: 735ft with SPC no rest break 04/26/22: 794 ft 06/16/22: 903 ft with SPC, no rest.   Time 8    Period Weeks    Status Met         PT LONG TERM GOAL #3   Title Pt to demonstrate improved leg power AEB 5xSTS from chair <14sec hands free and without need for minGuard assist for safety and without need for foot block.    Baseline 03/01/22: 22sec hands free, feet blocked, minGuard assist  03/31/22: 14.82sec  4/23:17.3 sec 06/16/22: 13.87 sec   Time 8     Period Weeks    Status MET/ONGOING         PT LONG TERM GOAL #4   Title Pt to demonstrate improvement in balance performance AEB >6 improvement BERG Balance for balance assessment.    Baseline 41 on 03/03/22. 3/38/24: 42 4/23: 45 06/16/22: 48   Time 8    Period Weeks     Status Met          5.  Pt will improve distance with LRAD to 900 feet or greater in order to indicate further progress  to community ambulation distances Baseline: 794 ft with SPC , 06/16/22: 903 ft with SPC. Goal status: Met/ongoing (continue to assess to ensure maintenance of progress)  6.  Patient will improve modified DGI (with use of cane) score > 4 points or greater in order to indicate improved balance and decreased risk of falls.  Baseline: 6/18: 11 Goal status: INITIAL      Plan     Clinical Impression Statement Pt performed well with all tasks and was introduced to punching activities that engaged core musculature as well as UE.  Pt able to tolerate all exercises with good endurance, noting some fatigue at the end of the session, but mostly due to the ambulation attempt occurring at the end of the session.  Pt is making good progress towards goals at this time.   Pt will continue to benefit from skilled therapy to address remaining deficits in order to improve overall QoL and return to PLOF.       Personal Factors and Comorbidities Time since onset of injury/illness/exacerbation;Past/Current Experience    Examination-Activity Limitations Locomotion Level;Transfers;Bed Mobility;Reach Overhead;Hygiene/Grooming;Dressing;Toileting;Stairs;Bend    Stability/Clinical Decision Making Evolving/Moderate complexity    Clinical Decision Making Moderate    Rehab Potential Good    PT Frequency 2x / week    PT Duration 8 weeks    PT Treatment/Interventions ADLs/Self Care Home Management;Moist Heat;Gait training;Stair training;Functional mobility training;Therapeutic activities;Therapeutic exercise;Balance training;Neuromuscular re-education;Patient/family education    PT Next Visit Plan .  STS PWR! Up with eventual addition of PWR! Step. Work on weight shift cues with STS. Dynamic balance/gait training as indicated.    PT Home Exercise Plan 03/01/22: discussed finding and elevated sit  surface for future STS exercises (something ~18-19 inches high)    Consulted and Agree with Plan of Care Patient             Patient will benefit from skilled therapeutic intervention in order to improve the following deficits and impairments:  balance, strength, coordination, gait, transfers. Posture. ROM   Visit Diagnosis: Muscle weakness (generalized)  Other lack of coordination  Tremor  Unsteadiness on feet  Difficulty in walking, not elsewhere classified  Other abnormalities of gait and mobility    Problem List Patient Active Problem List   Diagnosis Date Noted   Acute respiratory failure due to COVID-19 (HCC) 05/28/2019   Alcohol dependence with uncomplicated withdrawal (HCC) 01/23/2018   Alcohol withdrawal (HCC) 01/23/2018   GIB (gastrointestinal bleeding) 12/09/2017   A-fib (HCC) 12/09/2017   Esophageal obstruction 10/10/2016   Esophageal ulceration 10/10/2016   GI bleed 09/20/2016   Low vitamin B12 level 03/18/2016   S/P kyphoplasty 03/18/2016   Basal cell carcinoma 02/08/2016   Atrial fibrillation (HCC) 01/27/2016   Colitis 01/27/2016   HLD (hyperlipidemia) 01/27/2016   Anxiety 01/27/2016   Hypokalemia 01/27/2016   Major depressive disorder, recurrent episode, moderate (HCC) 04/14/2015   Anxiety, generalized 12/06/2013   Parkinson disease 08/07/2013   Dysphagia 05/12/2013   Esophageal mass 05/12/2013   GERD (gastroesophageal reflux disease) 05/12/2013   Vomiting 05/12/2013   Open bite of lower leg 05/10/2013   Dog bite of lower leg 05/10/2013   Abnormal finding on liver function 03/25/2013   Benign neoplasm 03/25/2013   Dizziness 03/25/2013   Type 2 diabetes mellitus (HCC) 03/25/2013   BP (high blood pressure) 03/25/2013   Awareness of heartbeats 03/25/2013   Pure hypercholesterolemia 03/25/2013   Infective tonsillitis 03/25/2013   Adenomatous polyp 03/25/2013       Nolon Bussing, PT, DPT  Physical Therapist - [facility]  06/30/22, 3:46 PM

## 2022-07-05 ENCOUNTER — Encounter: Payer: Self-pay | Admitting: Physical Therapy

## 2022-07-05 ENCOUNTER — Ambulatory Visit: Payer: Medicare Other | Admitting: Occupational Therapy

## 2022-07-05 ENCOUNTER — Ambulatory Visit: Payer: Medicare Other | Attending: Neurology | Admitting: Physical Therapy

## 2022-07-05 DIAGNOSIS — R269 Unspecified abnormalities of gait and mobility: Secondary | ICD-10-CM | POA: Insufficient documentation

## 2022-07-05 DIAGNOSIS — M6281 Muscle weakness (generalized): Secondary | ICD-10-CM

## 2022-07-05 DIAGNOSIS — R2681 Unsteadiness on feet: Secondary | ICD-10-CM

## 2022-07-05 DIAGNOSIS — R278 Other lack of coordination: Secondary | ICD-10-CM

## 2022-07-05 DIAGNOSIS — R262 Difficulty in walking, not elsewhere classified: Secondary | ICD-10-CM

## 2022-07-05 DIAGNOSIS — R2689 Other abnormalities of gait and mobility: Secondary | ICD-10-CM

## 2022-07-05 NOTE — Therapy (Signed)
OUTPATIENT OCCUPATIONAL THERAPY NEURO TREATMENT NOTE  Patient Name: Charles Stanton MRN: [id number] DOB:[birth year], 67 y.o., male Today's Date: 07/05/2022  PCP: Dr. Barbette Reichmann REFERRING PROVIDER: Dr. Cristopher Peru  END OF SESSION:  OT End of Session - 07/05/22 1613     Visit Number 9    Number of Visits 24    Date for OT Re-Evaluation 08/18/22    OT Start Time 1345    OT Stop Time 1430    OT Time Calculation (min) 45 min    Equipment Utilized During Treatment RW    Activity Tolerance Patient tolerated treatment well    Behavior During Therapy [facility] for tasks assessed/performed            Past Medical History:  Diagnosis Date   Abnormal liver function    Anemia    Anxiety    Atrial fibrillation (HCC)    Cancer (HCC) 2017   skin   Colonic mass    Depression    Dizziness    Dry cough    History of colon polyps    Hypercholesteremia    Hyperlipemia    Hypertension    Palpitations    Parkinson's disease    Parkinson's disease    PVD (peripheral vascular disease) (HCC)    Tonsillitis 03/25/2013   Tremors of nervous system    Past Surgical History:  Procedure Laterality Date   BACK SURGERY     Kyphoplasty   March 2018   COLONOSCOPY     COLONOSCOPY WITH PROPOFOL N/A 12/12/2017   Procedure: COLONOSCOPY WITH PROPOFOL;  Surgeon: Wyline Mood, MD;  Location: [facility] ENDOSCOPY;  Service: Gastroenterology;  Laterality: N/A;   ESOPHAGOGASTRODUODENOSCOPY N/A 12/11/2017   Procedure: ESOPHAGOGASTRODUODENOSCOPY (EGD);  Surgeon: Wyline Mood, MD;  Location: [facility] ENDOSCOPY;  Service: Gastroenterology;  Laterality: N/A;   ESOPHAGOGASTRODUODENOSCOPY N/A 10/08/2021   Procedure: ESOPHAGOGASTRODUODENOSCOPY (EGD);  Surgeon: Regis Bill, MD;  Location: [facility] ENDOSCOPY;  Service: Endoscopy;  Laterality: N/A;   ESOPHAGOGASTRODUODENOSCOPY (EGD) WITH PROPOFOL N/A 09/21/2016   Procedure: ESOPHAGOGASTRODUODENOSCOPY (EGD) WITH PROPOFOL;  Surgeon: Wyline Mood, MD;  Location: [facility] ENDOSCOPY;   Service: Gastroenterology;  Laterality: N/A;   ESOPHAGOGASTRODUODENOSCOPY (EGD) WITH PROPOFOL N/A 11/07/2016   Procedure: ESOPHAGOGASTRODUODENOSCOPY (EGD) WITH PROPOFOL;  Surgeon: Wyline Mood, MD;  Location: [facility] ENDOSCOPY;  Service: Gastroenterology;  Laterality: N/A;   ESOPHAGOGASTRODUODENOSCOPY (EGD) WITH PROPOFOL N/A 10/15/2019   Procedure: ESOPHAGOGASTRODUODENOSCOPY (EGD) WITH PROPOFOL;  Surgeon: Regis Bill, MD;  Location: [facility] ENDOSCOPY;  Service: Endoscopy;  Laterality: N/A;   ESOPHAGOGASTRODUODENOSCOPY (EGD) WITH PROPOFOL N/A 10/28/2019   Procedure: ESOPHAGOGASTRODUODENOSCOPY (EGD) WITH PROPOFOL;  Surgeon: Regis Bill, MD;  Location: [facility] ENDOSCOPY;  Service: Endoscopy;  Laterality: N/A;   ESOPHAGOGASTRODUODENOSCOPY (EGD) WITH PROPOFOL N/A 12/02/2019   Procedure: ESOPHAGOGASTRODUODENOSCOPY (EGD) WITH PROPOFOL;  Surgeon: Regis Bill, MD;  Location: [facility] ENDOSCOPY;  Service: Endoscopy;  Laterality: N/A;   ESOPHAGOGASTRODUODENOSCOPY (EGD) WITH PROPOFOL N/A 02/18/2020   Procedure: ESOPHAGOGASTRODUODENOSCOPY (EGD) WITH PROPOFOL;  Surgeon: Regis Bill, MD;  Location: [facility] ENDOSCOPY;  Service: Endoscopy;  Laterality: N/A;   ESOPHAGOGASTRODUODENOSCOPY (EGD) WITH PROPOFOL N/A 08/02/2021   Procedure: ESOPHAGOGASTRODUODENOSCOPY (EGD) WITH PROPOFOL;  Surgeon: Regis Bill, MD;  Location: [facility] ENDOSCOPY;  Service: Endoscopy;  Laterality: N/A;  REQUEST AM   ESOPHAGOGASTRODUODENOSCOPY (EGD) WITH PROPOFOL N/A 10/05/2021   Procedure: ESOPHAGOGASTRODUODENOSCOPY (EGD) WITH PROPOFOL;  Surgeon: Regis Bill, MD;  Location: [facility] ENDOSCOPY;  Service: Endoscopy;  Laterality: N/A;   FRACTURE SURGERY Right 05/28/2019   distal radius fracture   HERNIA  REPAIR     left inguinal hernia repair as an infant   KYPHOPLASTY N/A 02/11/2016   Procedure: KYPHOPLASTY  L2,T9;  Surgeon: Kennedy Bucker, MD;  Location: [facility] ORS;  Service: Orthopedics;  Laterality: N/A;   KYPHOPLASTY N/A  03/15/2016   Procedure: KYPHOPLASTY L3;  Surgeon: Kennedy Bucker, MD;  Location: [facility] ORS;  Service: Orthopedics;  Laterality: N/A;   OPEN REDUCTION INTERNAL FIXATION (ORIF) DISTAL RADIAL FRACTURE Right 05/28/2019   Procedure: OPEN REDUCTION INTERNAL FIXATION (ORIF) DISTAL RADIAL FRACTURE;  Surgeon: Kennedy Bucker, MD;  Location: [facility] ORS;  Service: Orthopedics;  Laterality: Right;   skin cancer removed     TONSILLECTOMY     Patient Active Problem List   Diagnosis Date Noted   Acute respiratory failure due to COVID-19 (HCC) 05/28/2019   Alcohol dependence with uncomplicated withdrawal (HCC) 01/23/2018   Alcohol withdrawal (HCC) 01/23/2018   GIB (gastrointestinal bleeding) 12/09/2017   A-fib (HCC) 12/09/2017   Esophageal obstruction 10/10/2016   Esophageal ulceration 10/10/2016   GI bleed 09/20/2016   Low vitamin B12 level 03/18/2016   S/P kyphoplasty 03/18/2016   Basal cell carcinoma 02/08/2016   Atrial fibrillation (HCC) 01/27/2016   Colitis 01/27/2016   HLD (hyperlipidemia) 01/27/2016   Anxiety 01/27/2016   Hypokalemia 01/27/2016   Major depressive disorder, recurrent episode, moderate (HCC) 04/14/2015   Anxiety, generalized 12/06/2013   Parkinson disease 08/07/2013   Dysphagia 05/12/2013   Esophageal mass 05/12/2013   GERD (gastroesophageal reflux disease) 05/12/2013   Vomiting 05/12/2013   Open bite of lower leg 05/10/2013   Dog bite of lower leg 05/10/2013   Abnormal finding on liver function 03/25/2013   Benign neoplasm 03/25/2013   Dizziness 03/25/2013   Type 2 diabetes mellitus (HCC) 03/25/2013   BP (high blood pressure) 03/25/2013   Awareness of heartbeats 03/25/2013   Pure hypercholesterolemia 03/25/2013   Infective tonsillitis 03/25/2013   Adenomatous polyp 03/25/2013   ONSET DATE: 10 years   REFERRING DIAG: Parkinson's Disease  THERAPY DIAG:  Muscle weakness (generalized)  Other lack of coordination  Rationale for Evaluation and Treatment:  Rehabilitation  SUBJECTIVE:  SUBJECTIVE STATEMENT: Pt reports doing well today Pt accompanied by: self; spouse present in lobby, but did not follow for eval  PERTINENT HISTORY: Pt has been working with outpatient PT here at [facility] since end of Feb 2024.  OT ordered d/t worsening PD symptoms contributing to functional decline with ADLs.   PRECAUTIONS: Fall  WEIGHT BEARING RESTRICTIONS: No  PAIN:  Are you having pain? No  FALLS: Has patient fallen in last 6 months? Yes. Number of falls 1  LIVING ENVIRONMENT: Lives with: lives with their spouse Lives in: split level but pt remains on main level to avoid steps Stairs: Yes: Internal: 2 steps; can reach both Has following equipment at home: Quad cane small base, Walker - 4 wheeled, bed side commode, Grab bars, and pull down seat for shower, hand held shower hose,   PLOF: Needs assistance with ADLs; pt reports that spouse assists but is consistent to always let pt try things first before she automatically helps.    PATIENT GOALS: To be more indep  OBJECTIVE:  HAND DOMINANCE: Right (most affected side from PD)  ADLs: Overall ADLs: spouse is primary caregiver Transfers/ambulation related to ADLs: uses RW Eating: spouse cuts food; pt reports having to use L non-dominant hand to eat for the last 5-6 yrs Grooming: set up; assist to squeeze toothpaste and denture paste; uses L non-dominant hand UB Dressing: set up; assist  with clothing fasteners LB Dressing: Min A; assist with socks, dons slip on shoes with set up (has tried a sockaid but was unsuccessful) Toileting: modified indep Bathing: Set up/min A; spouse squeezes shampoo into pt's hands, spouse helps to towel dry  Tub Shower transfers: distant supv Equipment:  see above   IADLs: Shopping: pt can accompany spouse by pushing a shopping cart.  Light housekeeping: pt can drag trash to bin and can roll the bin to the road and back Meal Prep: spouse manages  Community mobility:  spouse drives; uses rollator for longer distances  Medication management: spouse manages pills using weekly pill organizer.   Financial management: spouse manages  Handwriting: Mild micrographia and <25% legible  MOBILITY STATUS: Hx of falls  POSTURE COMMENTS:  rounded shoulders and forward head, R shoulder elevated  ACTIVITY TOLERANCE: Activity tolerance: TBD and assessed further with functional activities   FUNCTIONAL OUTCOME MEASURES: FOTO: TBD   UPPER EXTREMITY ROM:    Active ROM Right eval Left eval  Shoulder flexion 118 85  Shoulder abduction 128 124  Shoulder adduction    Shoulder extension    Shoulder internal rotation [facility] WFL  Shoulder external rotation [facility] WFL  Elbow flexion    Elbow extension    Wrist flexion 55 76  Wrist extension 57 75  Wrist ulnar deviation    Wrist radial deviation    Wrist pronation [facility] WFL  Wrist supination 63 75  (Blank rows = not tested)  UPPER EXTREMITY MMT:     MMT Right eval Left eval  Shoulder flexion 4 4  Shoulder abduction 4+ 4+  Shoulder adduction    Shoulder extension    Shoulder internal rotation    Shoulder external rotation    Middle trapezius    Lower trapezius    Elbow flexion 4+ 4+  Elbow extension 4+ 4+  Wrist flexion 4+ 4+  Wrist extension 4+ 4+  Wrist ulnar deviation    Wrist radial deviation    Wrist pronation    Wrist supination    (Blank rows = not tested)  HAND FUNCTION: Grip strength: Right: 16 lbs; Left: 38 lbs, Lateral pinch: Right: 10 lbs, Left: 14 lbs, and 3 point pinch: Right: 9 lbs, Left: 11 lbs  COORDINATION: 9 Hole Peg test: Right: 38 sec; Left: 40 sec  SENSATION: WFL  EDEMA: None  MUSCLE TONE: R/L normal  COGNITION: Overall cognitive status: Within functional limits for tasks assessed  VISION: Subjective report: wears glasses all the time    PRAXIS: Impaired: Motor planning  OBSERVATIONS:  R SF PIP flexion contracture 105* from 5-6 years ago from a fall  TODAY'S  TREATMENT:                                                                                                                              DATE: 07/05/22:  Therapeutic Exercise:  Pt. worked on [employer] with use of a hand gripper set at  6.6# on the R and 17.9# on the L to remove jumbo pegs from pegboard. Intermittent min vc and tactile cues for set up of hand gripper in hand to maximize closure with each gripping rep. Pt. performed resistive EZ Board exercises for forearm supination/pronation, wrist flexion/extension using gross grasp, and lateral pinch (key) grasp. Pt. performed resistive EZ Board exercises angled in several planes to promote shoulder flexion, abduction, and wrist flexion, and extension while performing resistive wrist flexion and extension with a gross grip.   Neuromuscular re-education:  Pt. worked on left hand [facility] skills grasping small 1/2" circular tipped pegs from a container, and placing them onto a pegboard positioned at an incline to promote wrist extension, and challenge the grasp pattern when extending the wrist.  PATIENT EDUCATION: Education details: Potential benefit of wrist weight to minimize UE tremor Person educated: Patient Education method: Explanation and Verbal cues, demo Education comprehension: verbalized understanding, demonstrated understanding  HOME EXERCISE PROGRAM: Theraputty   GOALS: Goals reviewed with patient? Yes  SHORT TERM GOALS: Target date: 07/07/22 (6 weeks)  Pt will be indep to perform HEP for improving bilat hand strength and coordination skills. Baseline: Eval: not yet initiated Goal status: INITIAL  LONG TERM GOALS: Target date: 08/18/22 (12 weeks)  Pt will increase FOTO score to (TBD) or better to indicate improvement in self perceived functional use of the R arm with daily tasks. Baseline: Eval: TBD Goal status: INITIAL  2.  Pt will increase R grip strength by 5 or more lbs to more easily hold and stabilize ADL  supplies in the R dominant hand. Baseline: R grip 16 lbs; limited from PD but also by 5th digit PIP flexion contracture, as this digit can not engage in gripping (L 38 lbs) Goal status: INITIAL  3.  Pt will improve R hand FMC/dexterity skills to be able to sign his name on medical/legal documents with 75% legibility, using adapted writing aids as needed. Baseline: Eval: <25% legible and pt verbalizes embarrassment when attempting to sign his name on forms Goal status: INITIAL  4.  Pt will increase R FMC/GMC skills to engage the RUE into UB ADLs at least 50% of the time. Baseline: Eval: Pt uses the L non-dominant arm for self care tasks. Goal status: INITIAL  ASSESSMENT:  CLINICAL IMPRESSION:  Pt. tolerated therapeutic exercises well this date. Pt. tolerated 6.6# on the R side and 11.2# on the L using the hand grip strengthener to remove jumbo pegs from pegboard. Pt. continues to present with decreased  grip on the R d/t R Small Finger flexion contracture preventing this digit from engaging in gripping on this hand. Pt required cues, and assist for proper movement patterns during [facility] skills, grip strengthening, and EZ Board tasks. Pt. continues to benefit from skilled OT for increasing bilateral hand strength, improve coordination, specifically working to increase engagement of the RUE during self care tasks.    PERFORMANCE DEFICITS: in functional skills including ADLs, IADLs, coordination, dexterity, ROM, strength, flexibility, Fine motor control, Gross motor control, mobility, balance, body mechanics, endurance, decreased knowledge of use of DME, and UE functional use, cognitive skills including emotional and memory, and psychosocial skills including coping strategies, environmental adaptation, habits, and routines and behaviors.   IMPAIRMENTS: are limiting patient from ADLs, IADLs, and leisure.   CO-MORBIDITIES: has co-morbidities such as anxiety, depression  that affects occupational  performance. Patient will benefit from skilled OT to address above impairments and improve overall function.  MODIFICATION OR ASSISTANCE TO COMPLETE EVALUATION: No  modification of tasks or assist necessary to complete an evaluation.  OT OCCUPATIONAL PROFILE AND HISTORY: Problem focused assessment: Including review of records relating to presenting problem.  CLINICAL DECISION MAKING: Moderate - several treatment options, min-mod task modification necessary  REHAB POTENTIAL: Good for goals  EVALUATION COMPLEXITY: Moderate    PLAN:  OT FREQUENCY: 2x/week  OT DURATION: 12 weeks  PLANNED INTERVENTIONS: self care/ADL training, therapeutic exercise, therapeutic activity, neuromuscular re-education, manual therapy, passive range of motion, balance training, functional mobility training, moist heat, cryotherapy, patient/family education, cognitive remediation/compensation, psychosocial skills training, energy conservation, coping strategies training, and DME and/or AE instructions  RECOMMENDED OTHER SERVICES: None at this time  CONSULTED AND AGREED WITH PLAN OF CARE: Patient  PLAN FOR NEXT SESSION: initiate HEP, complete Darrel Hoover, MS, OTR/L  07/05/2022

## 2022-07-05 NOTE — Therapy (Signed)
Outpatient Physical Therapy Treatment    Patient Details  Name: Charles Stanton MRN: [id number] Date of Birth: Jun 02, 1955 Referring Provider (PT): Dr. Cristopher Peru   Encounter Date: 07/05/2022  END OF SESSION:    PT End of Session - 07/05/22 1319     Visit Number 35    Number of Visits 40    Date for PT Re-Evaluation 07/19/22    Authorization Type Medicare A & B primary; federal employee secondary    Authorization Time Period -07/19/22    Progress Note Due on Visit 30    PT Start Time 1300    PT Stop Time 1343    PT Time Calculation (min) 43 min    Equipment Utilized During Treatment Gait belt    Activity Tolerance Patient tolerated treatment well;No increased pain    Behavior During Therapy [facility] for tasks assessed/performed                 Past Medical History:  Diagnosis Date   Abnormal liver function    Anemia    Anxiety    Atrial fibrillation (HCC)    Cancer (HCC) 2017   skin   Colonic mass    Depression    Dizziness    Dry cough    History of colon polyps    Hypercholesteremia    Hyperlipemia    Hypertension    Palpitations    Parkinson's disease    Parkinson's disease    PVD (peripheral vascular disease) (HCC)    Tonsillitis 03/25/2013   Tremors of nervous system     Past Surgical History:  Procedure Laterality Date   BACK SURGERY     Kyphoplasty   March 2018   COLONOSCOPY     COLONOSCOPY WITH PROPOFOL N/A 12/12/2017   Procedure: COLONOSCOPY WITH PROPOFOL;  Surgeon: Wyline Mood, MD;  Location: [facility] ENDOSCOPY;  Service: Gastroenterology;  Laterality: N/A;   ESOPHAGOGASTRODUODENOSCOPY N/A 12/11/2017   Procedure: ESOPHAGOGASTRODUODENOSCOPY (EGD);  Surgeon: Wyline Mood, MD;  Location: [facility] ENDOSCOPY;  Service: Gastroenterology;  Laterality: N/A;   ESOPHAGOGASTRODUODENOSCOPY N/A 10/08/2021   Procedure: ESOPHAGOGASTRODUODENOSCOPY (EGD);  Surgeon: Regis Bill, MD;  Location: [facility] ENDOSCOPY;  Service: Endoscopy;  Laterality: N/A;    ESOPHAGOGASTRODUODENOSCOPY (EGD) WITH PROPOFOL N/A 09/21/2016   Procedure: ESOPHAGOGASTRODUODENOSCOPY (EGD) WITH PROPOFOL;  Surgeon: Wyline Mood, MD;  Location: [facility] ENDOSCOPY;  Service: Gastroenterology;  Laterality: N/A;   ESOPHAGOGASTRODUODENOSCOPY (EGD) WITH PROPOFOL N/A 11/07/2016   Procedure: ESOPHAGOGASTRODUODENOSCOPY (EGD) WITH PROPOFOL;  Surgeon: Wyline Mood, MD;  Location: [facility] ENDOSCOPY;  Service: Gastroenterology;  Laterality: N/A;   ESOPHAGOGASTRODUODENOSCOPY (EGD) WITH PROPOFOL N/A 10/15/2019   Procedure: ESOPHAGOGASTRODUODENOSCOPY (EGD) WITH PROPOFOL;  Surgeon: Regis Bill, MD;  Location: [facility] ENDOSCOPY;  Service: Endoscopy;  Laterality: N/A;   ESOPHAGOGASTRODUODENOSCOPY (EGD) WITH PROPOFOL N/A 10/28/2019   Procedure: ESOPHAGOGASTRODUODENOSCOPY (EGD) WITH PROPOFOL;  Surgeon: Regis Bill, MD;  Location: [facility] ENDOSCOPY;  Service: Endoscopy;  Laterality: N/A;   ESOPHAGOGASTRODUODENOSCOPY (EGD) WITH PROPOFOL N/A 12/02/2019   Procedure: ESOPHAGOGASTRODUODENOSCOPY (EGD) WITH PROPOFOL;  Surgeon: Regis Bill, MD;  Location: [facility] ENDOSCOPY;  Service: Endoscopy;  Laterality: N/A;   ESOPHAGOGASTRODUODENOSCOPY (EGD) WITH PROPOFOL N/A 02/18/2020   Procedure: ESOPHAGOGASTRODUODENOSCOPY (EGD) WITH PROPOFOL;  Surgeon: Regis Bill, MD;  Location: [facility] ENDOSCOPY;  Service: Endoscopy;  Laterality: N/A;   ESOPHAGOGASTRODUODENOSCOPY (EGD) WITH PROPOFOL N/A 08/02/2021   Procedure: ESOPHAGOGASTRODUODENOSCOPY (EGD) WITH PROPOFOL;  Surgeon: Regis Bill, MD;  Location: [facility] ENDOSCOPY;  Service: Endoscopy;  Laterality: N/A;  REQUEST AM   ESOPHAGOGASTRODUODENOSCOPY (EGD)  WITH PROPOFOL N/A 10/05/2021   Procedure: ESOPHAGOGASTRODUODENOSCOPY (EGD) WITH PROPOFOL;  Surgeon: Regis Bill, MD;  Location: [facility] ENDOSCOPY;  Service: Endoscopy;  Laterality: N/A;   FRACTURE SURGERY Right 05/28/2019   distal radius fracture   HERNIA REPAIR     left inguinal hernia repair as an  infant   KYPHOPLASTY N/A 02/11/2016   Procedure: KYPHOPLASTY  L2,T9;  Surgeon: Kennedy Bucker, MD;  Location: [facility] ORS;  Service: Orthopedics;  Laterality: N/A;   KYPHOPLASTY N/A 03/15/2016   Procedure: KYPHOPLASTY L3;  Surgeon: Kennedy Bucker, MD;  Location: [facility] ORS;  Service: Orthopedics;  Laterality: N/A;   OPEN REDUCTION INTERNAL FIXATION (ORIF) DISTAL RADIAL FRACTURE Right 05/28/2019   Procedure: OPEN REDUCTION INTERNAL FIXATION (ORIF) DISTAL RADIAL FRACTURE;  Surgeon: Kennedy Bucker, MD;  Location: [facility] ORS;  Service: Orthopedics;  Laterality: Right;   skin cancer removed     TONSILLECTOMY       Subjective Assessment -      Subjective  Pt notes he is doing well today. No significant changes since last session.    Pertinent History Nahmir Stanton is a 66yoM who was prescribed with Parkinson's Disease ~ 10 years.  Pt is followed by neurology, reports good reponse to medications which help control his tremors somewhat. Pt uses a SPC for AMB, also uses a walker for longer distance walking. Pt goes into community almost daily (they like to eat out) which includes lots of SPC [place] eand navigation of 4 stairs at home.    Currently in Pain? No/denies             TREATMENT: Unless otherwise stated, CGA was provided and gait belt donned in order to ensure pt safety   NMR: -Outdoor ambulation with variable surfance navigation and with SPC, between concrete tile and grass. Solid surfaces: focused on cadence and sequencing with metronome at 54 bpm. For grass ambulation: cues for high step, working on safe clearance.  -Seated PWR!:Up and rock combo, twist, step. X 10 each  -Standing PWR!: Standing step and rock combo, stepping onto airex pad, and reach. X 10 each, additional set of step and reach combo with airex pad set further apart.   Pt using intermittent UE support of back of chair during standing exercises.  PWR! Up works on postural strengthening and antigravity extension, PRW! Rock working on  [employer] shifting, PRW! Twist targeting trunk rotation and PRW! Step targeting transition movements. PWR! Moves target bradykinesia, rigidity, and dyskinesia through targeted functional movements that address four core movement difficulties for people with Parkinson's disease.   -Seated cross body punches x 3 hits then pt ducking from therapist swing, done 2 x 10 pt reports this exercise as challenging but enjoys the activity.      PT Education -     Education Details Pt educated throughout session about proper posture and technique with exercises. Improved exercise technique, movement at target joints, use of target muscles after min to mod verbal, visual, tactile cues.      Person(s) Educated Patient    Methods Explanation;Demonstration ; handout   Comprehension Verbalized understanding;Returned demonstration;Need further instruction           PT Short Term Goals -       PT SHORT TERM GOAL #1   Title Pt to report regular performance of exercises prescribed for home and a sense of improvements in strength and balance AEB them not being as challenging anymore.    Baseline Will issue on visit 2  Time 4    Period Weeks    Status met   Target Date 03/15/22      PT SHORT TERM GOAL #2   Title Pt to demonstrate improved power AEB improved 5xSTS hands free in <18sec.    Baseline 03/01/22: 22sec hands free, author provideing foot block bilat 03/31/22: 14.82sec 4/23:17.3 sec   Time 4    Period Weeks    Status Met    Target Date 03/29/22               PT Long Term Goals - Target goal date for all remaining long term goals is 07/19/2022        PT LONG TERM GOAL #1   Title Pt to improve score on FOTO survey to 57 to indicate reduced difficulty with basic mobility required for ADL performance.    Baseline 03/01/22:49 04/29/22:56%. 06/16/22: 60   Time 8    Period Weeks    Status Met          PT LONG TERM GOAL #2   Title Pt to improve tolerance to overground AMB c  SPC AEB performance >895ft.    Baseline 2/27: fatigued after 484ft lap performed in four minutes ten seconds. 03/31/22: 757ft with SPC no rest break 04/26/22: 794 ft 06/16/22: 903 ft with SPC, no rest.   Time 8    Period Weeks    Status Met         PT LONG TERM GOAL #3   Title Pt to demonstrate improved leg power AEB 5xSTS from chair <14sec hands free and without need for minGuard assist for safety and without need for foot block.    Baseline 03/01/22: 22sec hands free, feet blocked, minGuard assist  03/31/22: 14.82sec  4/23:17.3 sec 06/16/22: 13.87 sec   Time 8     Period Weeks    Status MET/ONGOING         PT LONG TERM GOAL #4   Title Pt to demonstrate improvement in balance performance AEB >6 improvement BERG Balance for balance assessment.    Baseline 41 on 03/03/22. 3/38/24: 42 4/23: 45 06/16/22: 48   Time 8    Period Weeks    Status Met          5.  Pt will improve distance with LRAD to 900 feet or greater in order to indicate further progress to community ambulation distances Baseline: 794 ft with SPC , 06/16/22: 903 ft with SPC. Goal status: Met/ongoing (continue to assess to ensure maintenance of progress)  6.  Patient will improve modified DGI (with use of cane) score > 4 points or greater in order to indicate improved balance and decreased risk of falls.  Baseline: 6/18: 11 Goal status: INITIAL      Plan     Clinical Impression Statement Patient appeared motivated and ready for treatment on this day. Worked on outdoor variable ambulation, continuation of PWR! Specific move sets with a few progressions to further incorporate dynamic balance. Also continued with cross body punching activity to elicit increased trunk rotation and UE movement. Pt making steady progress with overall endurance, evidence by fewer rest breaks during longer durations of ambulation. Pt will continue to benefit from skilled therapy to address remaining deficits in order to improve overall QoL  and return to PLOF.      Personal Factors and Comorbidities Time since onset of injury/illness/exacerbation;Past/Current Experience    Examination-Activity Limitations Locomotion Level;Transfers;Bed Mobility;Reach Overhead;Hygiene/Grooming;Dressing;Toileting;Stairs;Bend    Stability/Clinical Decision Making Evolving/Moderate  complexity    Clinical Decision Making Moderate    Rehab Potential Good    PT Frequency 2x / week    PT Duration 8 weeks    PT Treatment/Interventions ADLs/Self Care Home Management;Moist Heat;Gait training;Stair training;Functional mobility training;Therapeutic activities;Therapeutic exercise;Balance training;Neuromuscular re-education;Patient/family education    PT Next Visit Plan .  STS PWR! Up with eventual addition of PWR! Step. Work on weight shift cues with STS. Dynamic balance/gait training as indicated.    PT Home Exercise Plan 03/01/22: discussed finding and elevated sit surface for future STS exercises (something ~18-19 inches high)    Consulted and Agree with Plan of Care Patient             Patient will benefit from skilled therapeutic intervention in order to improve the following deficits and impairments:  balance, strength, coordination, gait, transfers. Posture. ROM   Visit Diagnosis: Difficulty in walking, not elsewhere classified  Muscle weakness (generalized)  Other abnormalities of gait and mobility  Unsteadiness on feet    Problem List Patient Active Problem List   Diagnosis Date Noted   Acute respiratory failure due to COVID-19 (HCC) 05/28/2019   Alcohol dependence with uncomplicated withdrawal (HCC) 01/23/2018   Alcohol withdrawal (HCC) 01/23/2018   GIB (gastrointestinal bleeding) 12/09/2017   A-fib (HCC) 12/09/2017   Esophageal obstruction 10/10/2016   Esophageal ulceration 10/10/2016   GI bleed 09/20/2016   Low vitamin B12 level 03/18/2016   S/P kyphoplasty 03/18/2016   Basal cell carcinoma 02/08/2016   Atrial fibrillation  (HCC) 01/27/2016   Colitis 01/27/2016   HLD (hyperlipidemia) 01/27/2016   Anxiety 01/27/2016   Hypokalemia 01/27/2016   Major depressive disorder, recurrent episode, moderate (HCC) 04/14/2015   Anxiety, generalized 12/06/2013   Parkinson disease 08/07/2013   Dysphagia 05/12/2013   Esophageal mass 05/12/2013   GERD (gastroesophageal reflux disease) 05/12/2013   Vomiting 05/12/2013   Open bite of lower leg 05/10/2013   Dog bite of lower leg 05/10/2013   Abnormal finding on liver function 03/25/2013   Benign neoplasm 03/25/2013   Dizziness 03/25/2013   Type 2 diabetes mellitus (HCC) 03/25/2013   BP (high blood pressure) 03/25/2013   Awareness of heartbeats 03/25/2013   Pure hypercholesterolemia 03/25/2013   Infective tonsillitis 03/25/2013   Adenomatous polyp 03/25/2013     Cecile Sheerer, SPT   This entire session was performed under direct supervision and direction of a licensed therapist/therapist assistant . I have personally read, edited and approve of the note as written.    This licensed clinician was present and actively directing care throughout the session at all times.  Norman Herrlich PT ,DPT Physical Therapist- [facility]    07/05/22, 2:14 PM

## 2022-07-12 ENCOUNTER — Ambulatory Visit: Payer: Medicare Other | Admitting: Physical Therapy

## 2022-07-12 ENCOUNTER — Ambulatory Visit: Payer: Medicare Other | Admitting: Occupational Therapy

## 2022-07-12 DIAGNOSIS — R2689 Other abnormalities of gait and mobility: Secondary | ICD-10-CM

## 2022-07-12 DIAGNOSIS — M6281 Muscle weakness (generalized): Secondary | ICD-10-CM

## 2022-07-12 DIAGNOSIS — R262 Difficulty in walking, not elsewhere classified: Secondary | ICD-10-CM | POA: Diagnosis not present

## 2022-07-12 DIAGNOSIS — R278 Other lack of coordination: Secondary | ICD-10-CM

## 2022-07-12 NOTE — Therapy (Signed)
Outpatient Physical Therapy Treatment    Patient Details  Name: Charles Stanton MRN: [id number] Date of Birth: May 13, 1955 Referring Provider (PT): Dr. Cristopher Peru   Encounter Date: 07/12/2022  END OF SESSION:    PT End of Session - 07/12/22 1355     Visit Number 36    Number of Visits 40    Date for PT Re-Evaluation 07/19/22    Authorization Type Medicare A & B primary; federal employee secondary    Authorization Time Period -07/19/22    Progress Note Due on Visit 30    PT Start Time 1400    PT Stop Time 1445    PT Time Calculation (min) 45 min    Equipment Utilized During Treatment Gait belt    Activity Tolerance Patient tolerated treatment well;No increased pain    Behavior During Therapy [facility] for tasks assessed/performed                  Past Medical History:  Diagnosis Date   Abnormal liver function    Anemia    Anxiety    Atrial fibrillation (HCC)    Cancer (HCC) 2017   skin   Colonic mass    Depression    Dizziness    Dry cough    History of colon polyps    Hypercholesteremia    Hyperlipemia    Hypertension    Palpitations    Parkinson's disease    Parkinson's disease    PVD (peripheral vascular disease) (HCC)    Tonsillitis 03/25/2013   Tremors of nervous system     Past Surgical History:  Procedure Laterality Date   BACK SURGERY     Kyphoplasty   March 2018   COLONOSCOPY     COLONOSCOPY WITH PROPOFOL N/A 12/12/2017   Procedure: COLONOSCOPY WITH PROPOFOL;  Surgeon: Wyline Mood, MD;  Location: [facility] ENDOSCOPY;  Service: Gastroenterology;  Laterality: N/A;   ESOPHAGOGASTRODUODENOSCOPY N/A 12/11/2017   Procedure: ESOPHAGOGASTRODUODENOSCOPY (EGD);  Surgeon: Wyline Mood, MD;  Location: [facility] ENDOSCOPY;  Service: Gastroenterology;  Laterality: N/A;   ESOPHAGOGASTRODUODENOSCOPY N/A 10/08/2021   Procedure: ESOPHAGOGASTRODUODENOSCOPY (EGD);  Surgeon: Regis Bill, MD;  Location: [facility] ENDOSCOPY;  Service: Endoscopy;  Laterality: N/A;    ESOPHAGOGASTRODUODENOSCOPY (EGD) WITH PROPOFOL N/A 09/21/2016   Procedure: ESOPHAGOGASTRODUODENOSCOPY (EGD) WITH PROPOFOL;  Surgeon: Wyline Mood, MD;  Location: [facility] ENDOSCOPY;  Service: Gastroenterology;  Laterality: N/A;   ESOPHAGOGASTRODUODENOSCOPY (EGD) WITH PROPOFOL N/A 11/07/2016   Procedure: ESOPHAGOGASTRODUODENOSCOPY (EGD) WITH PROPOFOL;  Surgeon: Wyline Mood, MD;  Location: [facility] ENDOSCOPY;  Service: Gastroenterology;  Laterality: N/A;   ESOPHAGOGASTRODUODENOSCOPY (EGD) WITH PROPOFOL N/A 10/15/2019   Procedure: ESOPHAGOGASTRODUODENOSCOPY (EGD) WITH PROPOFOL;  Surgeon: Regis Bill, MD;  Location: [facility] ENDOSCOPY;  Service: Endoscopy;  Laterality: N/A;   ESOPHAGOGASTRODUODENOSCOPY (EGD) WITH PROPOFOL N/A 10/28/2019   Procedure: ESOPHAGOGASTRODUODENOSCOPY (EGD) WITH PROPOFOL;  Surgeon: Regis Bill, MD;  Location: [facility] ENDOSCOPY;  Service: Endoscopy;  Laterality: N/A;   ESOPHAGOGASTRODUODENOSCOPY (EGD) WITH PROPOFOL N/A 12/02/2019   Procedure: ESOPHAGOGASTRODUODENOSCOPY (EGD) WITH PROPOFOL;  Surgeon: Regis Bill, MD;  Location: [facility] ENDOSCOPY;  Service: Endoscopy;  Laterality: N/A;   ESOPHAGOGASTRODUODENOSCOPY (EGD) WITH PROPOFOL N/A 02/18/2020   Procedure: ESOPHAGOGASTRODUODENOSCOPY (EGD) WITH PROPOFOL;  Surgeon: Regis Bill, MD;  Location: [facility] ENDOSCOPY;  Service: Endoscopy;  Laterality: N/A;   ESOPHAGOGASTRODUODENOSCOPY (EGD) WITH PROPOFOL N/A 08/02/2021   Procedure: ESOPHAGOGASTRODUODENOSCOPY (EGD) WITH PROPOFOL;  Surgeon: Regis Bill, MD;  Location: [facility] ENDOSCOPY;  Service: Endoscopy;  Laterality: N/A;  REQUEST AM   ESOPHAGOGASTRODUODENOSCOPY (  EGD) WITH PROPOFOL N/A 10/05/2021   Procedure: ESOPHAGOGASTRODUODENOSCOPY (EGD) WITH PROPOFOL;  Surgeon: Regis Bill, MD;  Location: [facility] ENDOSCOPY;  Service: Endoscopy;  Laterality: N/A;   FRACTURE SURGERY Right 05/28/2019   distal radius fracture   HERNIA REPAIR     left inguinal hernia repair as an  infant   KYPHOPLASTY N/A 02/11/2016   Procedure: KYPHOPLASTY  L2,T9;  Surgeon: Kennedy Bucker, MD;  Location: [facility] ORS;  Service: Orthopedics;  Laterality: N/A;   KYPHOPLASTY N/A 03/15/2016   Procedure: KYPHOPLASTY L3;  Surgeon: Kennedy Bucker, MD;  Location: [facility] ORS;  Service: Orthopedics;  Laterality: N/A;   OPEN REDUCTION INTERNAL FIXATION (ORIF) DISTAL RADIAL FRACTURE Right 05/28/2019   Procedure: OPEN REDUCTION INTERNAL FIXATION (ORIF) DISTAL RADIAL FRACTURE;  Surgeon: Kennedy Bucker, MD;  Location: [facility] ORS;  Service: Orthopedics;  Laterality: Right;   skin cancer removed     TONSILLECTOMY       Subjective Assessment -      Subjective  Pt notes he is doing well today. No significant changes since last session.    Pertinent History Charles Stanton is a 66yoM who was prescribed with Parkinson's Disease ~ 10 years.  Pt is followed by neurology, reports good reponse to medications which help control his tremors somewhat. Pt uses a SPC for AMB, also uses a walker for longer distance walking. Pt goes into community almost daily (they like to eat out) which includes lots of SPC [place] eand navigation of 4 stairs at home.    Currently in Pain? No/denies             TREATMENT: Unless otherwise stated, CGA was provided and gait belt donned in order to ensure pt safety   NMR:  All ambulation done with metronome placed at 54 bpm for sequencing and gait speed.  -Octane warm-up level 2 x 3 min, level 1 x 2 min  -Seated PWR!: Up, Rock, twist, step 2 x 10   -Seated PWR! Step and standing up flow: x 10   - Ambulation 2 x 150 lap around gym, 2nd bout of ambulation to [facility] and back x 1100 feet with rest break halfway through.  -Standing PWR!: Standing step, rock, twist x 10 additional stepping onto airex pad, and reach. X 10 each, additional set of step and reach combo with airex pad set further apart.   -PWR up to stand 2 x 10  -Ambulation to OT session thorough hallway x 100  feet.  PWR! Up works on postural strengthening and antigravity extension, PRW! Rock working on [employer] shifting, PRW! Twist targeting trunk rotation and PRW! Step targeting transition movements. PWR! Moves target bradykinesia, rigidity, and dyskinesia through targeted functional movements that address four core movement difficulties for people with Parkinson's disease.      PT Education -     Education Details Pt educated throughout session about proper posture and technique with exercises. Improved exercise technique, movement at target joints, use of target muscles after min to mod verbal, visual, tactile cues.      Person(s) Educated Patient    Methods Explanation;Demonstration ; handout   Comprehension Verbalized understanding;Returned demonstration;Need further instruction           PT Short Term Goals -       PT SHORT TERM GOAL #1   Title Pt to report regular performance of exercises prescribed for home and a sense of improvements in strength and balance AEB them not being as challenging anymore.  Baseline Will issue on visit 2    Time 4    Period Weeks    Status met   Target Date 03/15/22      PT SHORT TERM GOAL #2   Title Pt to demonstrate improved power AEB improved 5xSTS hands free in <18sec.    Baseline 03/01/22: 22sec hands free, author provideing foot block bilat 03/31/22: 14.82sec 4/23:17.3 sec   Time 4    Period Weeks    Status Met    Target Date 03/29/22               PT Long Term Goals - Target goal date for all remaining long term goals is 07/19/2022        PT LONG TERM GOAL #1   Title Pt to improve score on FOTO survey to 57 to indicate reduced difficulty with basic mobility required for ADL performance.    Baseline 03/01/22:49 04/29/22:56%. 06/16/22: 60   Time 8    Period Weeks    Status Met          PT LONG TERM GOAL #2   Title Pt to improve tolerance to overground AMB c SPC AEB performance >866ft.    Baseline 2/27: fatigued  after 445ft lap performed in four minutes ten seconds. 03/31/22: 716ft with SPC no rest break 04/26/22: 794 ft 06/16/22: 903 ft with SPC, no rest.   Time 8    Period Weeks    Status Met         PT LONG TERM GOAL #3   Title Pt to demonstrate improved leg power AEB 5xSTS from chair <14sec hands free and without need for minGuard assist for safety and without need for foot block.    Baseline 03/01/22: 22sec hands free, feet blocked, minGuard assist  03/31/22: 14.82sec  4/23:17.3 sec 06/16/22: 13.87 sec   Time 8     Period Weeks    Status MET/ONGOING         PT LONG TERM GOAL #4   Title Pt to demonstrate improvement in balance performance AEB >6 improvement BERG Balance for balance assessment.    Baseline 41 on 03/03/22. 3/38/24: 42 4/23: 45 06/16/22: 48   Time 8    Period Weeks    Status Met          5.  Pt will improve distance with LRAD to 900 feet or greater in order to indicate further progress to community ambulation distances Baseline: 794 ft with SPC , 06/16/22: 903 ft with SPC. Goal status: Met/ongoing (continue to assess to ensure maintenance of progress)  6.  Patient will improve modified DGI (with use of cane) score > 4 points or greater in order to indicate improved balance and decreased risk of falls.  Baseline: 6/18: 11 Goal status: INITIAL      Plan     Clinical Impression Statement Patient appeared motivated and ready for treatment on this day. Worked on ambulation endurance. Continuation of PWR! Specific move sets with a few progressions to further incorporate dynamic balance. Pt making steady progress with overall endurance, evidence by fewer rest breaks during longer durations of ambulation. Pt will continue to benefit from skilled therapy to address remaining deficits in order to improve overall QoL and return to PLOF.    Personal Factors and Comorbidities Time since onset of injury/illness/exacerbation;Past/Current Experience    Examination-Activity Limitations  Locomotion Level;Transfers;Bed Mobility;Reach Overhead;Hygiene/Grooming;Dressing;Toileting;Stairs;Bend    Stability/Clinical Decision Making Evolving/Moderate complexity    Clinical Decision Making Moderate  Rehab Potential Good    PT Frequency 2x / week    PT Duration 8 weeks    PT Treatment/Interventions ADLs/Self Care Home Management;Moist Heat;Gait training;Stair training;Functional mobility training;Therapeutic activities;Therapeutic exercise;Balance training;Neuromuscular re-education;Patient/family education    PT Next Visit Plan .  STS PWR! Up with eventual addition of PWR! Step. Work on weight shift cues with STS. Dynamic balance/gait training as indicated.    PT Home Exercise Plan 03/01/22: discussed finding and elevated sit surface for future STS exercises (something ~18-19 inches high)    Consulted and Agree with Plan of Care Patient             Patient will benefit from skilled therapeutic intervention in order to improve the following deficits and impairments:  balance, strength, coordination, gait, transfers. Posture. ROM   Visit Diagnosis: Muscle weakness (generalized)  Other lack of coordination  Difficulty in walking, not elsewhere classified  Other abnormalities of gait and mobility    Problem List Patient Active Problem List   Diagnosis Date Noted   Acute respiratory failure due to COVID-19 (HCC) 05/28/2019   Alcohol dependence with uncomplicated withdrawal (HCC) 01/23/2018   Alcohol withdrawal (HCC) 01/23/2018   GIB (gastrointestinal bleeding) 12/09/2017   A-fib (HCC) 12/09/2017   Esophageal obstruction 10/10/2016   Esophageal ulceration 10/10/2016   GI bleed 09/20/2016   Low vitamin B12 level 03/18/2016   S/P kyphoplasty 03/18/2016   Basal cell carcinoma 02/08/2016   Atrial fibrillation (HCC) 01/27/2016   Colitis 01/27/2016   HLD (hyperlipidemia) 01/27/2016   Anxiety 01/27/2016   Hypokalemia 01/27/2016   Major depressive disorder, recurrent  episode, moderate (HCC) 04/14/2015   Anxiety, generalized 12/06/2013   Parkinson disease 08/07/2013   Dysphagia 05/12/2013   Esophageal mass 05/12/2013   GERD (gastroesophageal reflux disease) 05/12/2013   Vomiting 05/12/2013   Open bite of lower leg 05/10/2013   Dog bite of lower leg 05/10/2013   Abnormal finding on liver function 03/25/2013   Benign neoplasm 03/25/2013   Dizziness 03/25/2013   Type 2 diabetes mellitus (HCC) 03/25/2013   BP (high blood pressure) 03/25/2013   Awareness of heartbeats 03/25/2013   Pure hypercholesterolemia 03/25/2013   Infective tonsillitis 03/25/2013   Adenomatous polyp 03/25/2013   This entire session was performed under direct supervision and direction of a licensed therapist/therapist assistant . I have personally read, edited and approve of the note as written.    This licensed clinician was present and actively directing care throughout the session at all times.  Norman Herrlich PT ,DPT Physical Therapist- [facility]    [place], [place]   07/12/22, 3:24 PM

## 2022-07-12 NOTE — Therapy (Unsigned)
OUTPATIENT OCCUPATIONAL THERAPY NEURO PROGRESS NOTE 05/26/2022-07/13/2022  Patient Name: Charles Stanton MRN: [id number] DOB:02-Apr-1955, 67 y.o., male Today's Date: 07/12/2022  PCP: Dr. Barbette Reichmann REFERRING PROVIDER: Dr. Cristopher Peru  END OF SESSION:  OT End of Session - 07/12/22 1731     Visit Number 10    Number of Visits 24    Date for OT Re-Evaluation 08/18/22    OT Start Time 1445    OT Stop Time 1530    OT Time Calculation (min) 45 min    Equipment Utilized During Treatment Cane    Activity Tolerance Patient tolerated treatment well    Behavior During Therapy [facility] for tasks assessed/performed             Past Medical History:  Diagnosis Date   Abnormal liver function    Anemia    Anxiety    Atrial fibrillation (HCC)    Cancer (HCC) 2017   skin   Colonic mass    Depression    Dizziness    Dry cough    History of colon polyps    Hypercholesteremia    Hyperlipemia    Hypertension    Palpitations    Parkinson's disease    Parkinson's disease    PVD (peripheral vascular disease) (HCC)    Tonsillitis 03/25/2013   Tremors of nervous system    Past Surgical History:  Procedure Laterality Date   BACK SURGERY     Kyphoplasty   March 2018   COLONOSCOPY     COLONOSCOPY WITH PROPOFOL N/A 12/12/2017   Procedure: COLONOSCOPY WITH PROPOFOL;  Surgeon: Wyline Mood, MD;  Location: [facility] ENDOSCOPY;  Service: Gastroenterology;  Laterality: N/A;   ESOPHAGOGASTRODUODENOSCOPY N/A 12/11/2017   Procedure: ESOPHAGOGASTRODUODENOSCOPY (EGD);  Surgeon: Wyline Mood, MD;  Location: [facility] ENDOSCOPY;  Service: Gastroenterology;  Laterality: N/A;   ESOPHAGOGASTRODUODENOSCOPY N/A 10/08/2021   Procedure: ESOPHAGOGASTRODUODENOSCOPY (EGD);  Surgeon: Regis Bill, MD;  Location: [facility] ENDOSCOPY;  Service: Endoscopy;  Laterality: N/A;   ESOPHAGOGASTRODUODENOSCOPY (EGD) WITH PROPOFOL N/A 09/21/2016   Procedure: ESOPHAGOGASTRODUODENOSCOPY (EGD) WITH PROPOFOL;  Surgeon: Wyline Mood, MD;   Location: [facility] ENDOSCOPY;  Service: Gastroenterology;  Laterality: N/A;   ESOPHAGOGASTRODUODENOSCOPY (EGD) WITH PROPOFOL N/A 11/07/2016   Procedure: ESOPHAGOGASTRODUODENOSCOPY (EGD) WITH PROPOFOL;  Surgeon: Wyline Mood, MD;  Location: [facility] ENDOSCOPY;  Service: Gastroenterology;  Laterality: N/A;   ESOPHAGOGASTRODUODENOSCOPY (EGD) WITH PROPOFOL N/A 10/15/2019   Procedure: ESOPHAGOGASTRODUODENOSCOPY (EGD) WITH PROPOFOL;  Surgeon: Regis Bill, MD;  Location: [facility] ENDOSCOPY;  Service: Endoscopy;  Laterality: N/A;   ESOPHAGOGASTRODUODENOSCOPY (EGD) WITH PROPOFOL N/A 10/28/2019   Procedure: ESOPHAGOGASTRODUODENOSCOPY (EGD) WITH PROPOFOL;  Surgeon: Regis Bill, MD;  Location: [facility] ENDOSCOPY;  Service: Endoscopy;  Laterality: N/A;   ESOPHAGOGASTRODUODENOSCOPY (EGD) WITH PROPOFOL N/A 12/02/2019   Procedure: ESOPHAGOGASTRODUODENOSCOPY (EGD) WITH PROPOFOL;  Surgeon: Regis Bill, MD;  Location: [facility] ENDOSCOPY;  Service: Endoscopy;  Laterality: N/A;   ESOPHAGOGASTRODUODENOSCOPY (EGD) WITH PROPOFOL N/A 02/18/2020   Procedure: ESOPHAGOGASTRODUODENOSCOPY (EGD) WITH PROPOFOL;  Surgeon: Regis Bill, MD;  Location: [facility] ENDOSCOPY;  Service: Endoscopy;  Laterality: N/A;   ESOPHAGOGASTRODUODENOSCOPY (EGD) WITH PROPOFOL N/A 08/02/2021   Procedure: ESOPHAGOGASTRODUODENOSCOPY (EGD) WITH PROPOFOL;  Surgeon: Regis Bill, MD;  Location: [facility] ENDOSCOPY;  Service: Endoscopy;  Laterality: N/A;  REQUEST AM   ESOPHAGOGASTRODUODENOSCOPY (EGD) WITH PROPOFOL N/A 10/05/2021   Procedure: ESOPHAGOGASTRODUODENOSCOPY (EGD) WITH PROPOFOL;  Surgeon: Regis Bill, MD;  Location: [facility] ENDOSCOPY;  Service: Endoscopy;  Laterality: N/A;   FRACTURE SURGERY Right 05/28/2019   distal radius fracture  HERNIA REPAIR     left inguinal hernia repair as an infant   KYPHOPLASTY N/A 02/11/2016   Procedure: KYPHOPLASTY  L2,T9;  Surgeon: Kennedy Bucker, MD;  Location: [facility] ORS;  Service: Orthopedics;  Laterality:  N/A;   KYPHOPLASTY N/A 03/15/2016   Procedure: KYPHOPLASTY L3;  Surgeon: Kennedy Bucker, MD;  Location: [facility] ORS;  Service: Orthopedics;  Laterality: N/A;   OPEN REDUCTION INTERNAL FIXATION (ORIF) DISTAL RADIAL FRACTURE Right 05/28/2019   Procedure: OPEN REDUCTION INTERNAL FIXATION (ORIF) DISTAL RADIAL FRACTURE;  Surgeon: Kennedy Bucker, MD;  Location: [facility] ORS;  Service: Orthopedics;  Laterality: Right;   skin cancer removed     TONSILLECTOMY     Patient Active Problem List   Diagnosis Date Noted   Acute respiratory failure due to COVID-19 (HCC) 05/28/2019   Alcohol dependence with uncomplicated withdrawal (HCC) 01/23/2018   Alcohol withdrawal (HCC) 01/23/2018   GIB (gastrointestinal bleeding) 12/09/2017   A-fib (HCC) 12/09/2017   Esophageal obstruction 10/10/2016   Esophageal ulceration 10/10/2016   GI bleed 09/20/2016   Low vitamin B12 level 03/18/2016   S/P kyphoplasty 03/18/2016   Basal cell carcinoma 02/08/2016   Atrial fibrillation (HCC) 01/27/2016   Colitis 01/27/2016   HLD (hyperlipidemia) 01/27/2016   Anxiety 01/27/2016   Hypokalemia 01/27/2016   Major depressive disorder, recurrent episode, moderate (HCC) 04/14/2015   Anxiety, generalized 12/06/2013   Parkinson disease 08/07/2013   Dysphagia 05/12/2013   Esophageal mass 05/12/2013   GERD (gastroesophageal reflux disease) 05/12/2013   Vomiting 05/12/2013   Open bite of lower leg 05/10/2013   Dog bite of lower leg 05/10/2013   Abnormal finding on liver function 03/25/2013   Benign neoplasm 03/25/2013   Dizziness 03/25/2013   Type 2 diabetes mellitus (HCC) 03/25/2013   BP (high blood pressure) 03/25/2013   Awareness of heartbeats 03/25/2013   Pure hypercholesterolemia 03/25/2013   Infective tonsillitis 03/25/2013   Adenomatous polyp 03/25/2013   ONSET DATE: 10 years   REFERRING DIAG: Parkinson's Disease  THERAPY DIAG:  Muscle weakness (generalized)  Other lack of coordination  Rationale for Evaluation and  Treatment: Rehabilitation  SUBJECTIVE:  SUBJECTIVE STATEMENT: Pt reports he is doing well today. Pt. Reports he had a hard day this past Saturday due to his Parkinsons. Pt accompanied by: self; spouse present in lobby, but did not follow for eval  PERTINENT HISTORY: Pt has been working with outpatient PT here at [facility] since end of Feb 2024.  OT ordered d/t worsening PD symptoms contributing to functional decline with ADLs.   PRECAUTIONS: Fall  WEIGHT BEARING RESTRICTIONS: No  PAIN:  Are you having pain? No  FALLS: Has patient fallen in last 6 months? Yes. Number of falls 1  LIVING ENVIRONMENT: Lives with: lives with their spouse Lives in: split level but pt remains on main level to avoid steps Stairs: Yes: Internal: 2 steps; can reach both Has following equipment at home: Quad cane small base, Walker - 4 wheeled, bed side commode, Grab bars, and pull down seat for shower, hand held shower hose,   PLOF: Needs assistance with ADLs; pt reports that spouse assists but is consistent to always let pt try things first before she automatically helps.    PATIENT GOALS: To be more indep  OBJECTIVE:  HAND DOMINANCE: Right (most affected side from PD)  ADLs: Overall ADLs: spouse is primary caregiver Transfers/ambulation related to ADLs: uses RW Eating: spouse cuts food; pt reports having to use L non-dominant hand to eat for the last 5-6 yrs Grooming: set  up; assist to squeeze toothpaste and denture paste; uses L non-dominant hand UB Dressing: set up; assist with clothing fasteners LB Dressing: Min A; assist with socks, dons slip on shoes with set up (has tried a sockaid but was unsuccessful) Toileting: modified indep Bathing: Set up/min A; spouse squeezes shampoo into pt's hands, spouse helps to towel dry  Tub Shower transfers: distant supv Equipment:  see above   IADLs: Shopping: pt can accompany spouse by pushing a shopping cart.  Light housekeeping: pt can drag trash to bin and  can roll the bin to the road and back Meal Prep: spouse manages  Community mobility: spouse drives; uses rollator for longer distances  Medication management: spouse manages pills using weekly pill organizer.   Financial management: spouse manages  Handwriting: Mild micrographia and <25% legible  MOBILITY STATUS: Hx of falls  POSTURE COMMENTS:  rounded shoulders and forward head, R shoulder elevated  ACTIVITY TOLERANCE: Activity tolerance: TBD and assessed further with functional activities   FUNCTIONAL OUTCOME MEASURES: FOTO: TBD   UPPER EXTREMITY ROM:    Active ROM Right eval Left eval Right 07/12/2022 Left 07/12/2022  Shoulder flexion 118 85 120 117  Shoulder abduction 128 124 132 127  Shoulder adduction      Shoulder extension      Shoulder internal rotation [facility] WFL  Shoulder external rotation [facility] WFL  Elbow flexion      Elbow extension      Wrist flexion 55 76 59 75  Wrist extension 57 75 61 71  Wrist ulnar deviation      Wrist radial deviation      Wrist pronation [facility] WFL WFL  Wrist supination 63 75 66 81  (Blank rows = not tested)  UPPER EXTREMITY MMT:     MMT Right eval Left eval Right 07/12/2022 Left 07/12/2022  Shoulder flexion 4 4 4+ 4+  Shoulder abduction 4+ 4+ 4+ 4+  Shoulder adduction      Shoulder extension      Shoulder internal rotation      Shoulder external rotation      Middle trapezius      Lower trapezius      Elbow flexion 4+ 4+ 4+ 4+  Elbow extension 4+ 4+ 4+ 4+  Wrist flexion 4+ 4+ 4+ 4+  Wrist extension 4+ 4+ 4+ 4+  Wrist ulnar deviation      Wrist radial deviation      Wrist pronation      Wrist supination      (Blank rows = not tested)  HAND FUNCTION: Grip strength: Right: 16 lbs; Left: 38 lbs, Lateral pinch: Right: 10 lbs, Left: 14 lbs, and 3 point pinch: Right: 9 lbs, Left: 11 lbs 07/12/2022: Grip strength: Right: 16 lbs; Left: 40 lbs, Lateral pinch: Right: 10 lbs, Left: 14 lbs, and 3 point pinch: Right: 9  lbs, Left: 15 lbs COORDINATION: 9 Hole Peg test: Right: 38 sec; Left: 40 sec 07/12/2022: 9 Hole Peg test: Right: 35 sec; Left: 36 sec SENSATION: WFL  EDEMA: None  MUSCLE TONE: R/L normal  COGNITION: Overall cognitive status: Within functional limits for tasks assessed  VISION: Subjective report: wears glasses all the time    PRAXIS: Impaired: Motor planning  OBSERVATIONS:  R SF PIP flexion contracture 105* from 5-6 years ago from a fall  TODAY'S TREATMENT:  DATE: 07/12/22:  Measurements obtained and goals reviewed with the Pt.  Therapeutic Exercise:  Pt. worked on [employer] with use of a hand gripper set at 6.6# on the R and 17.9# on the L to remove jumbo pegs from pegboard. Intermittent min vc and tactile cues for set up of hand gripper in hand to maximize closure with each gripping rep.   PATIENT EDUCATION: Education details: Potential benefit of wrist weight to minimize UE tremor Person educated: Patient Education method: Explanation and Verbal cues, demo Education comprehension: verbalized understanding, demonstrated understanding  HOME EXERCISE PROGRAM: Theraputty   GOALS: Goals reviewed with patient? Yes  SHORT TERM GOALS: Target date: 07/07/22 (6 weeks)  Pt will be indep to perform HEP for improving bilat hand strength and coordination skills. Baseline: Eval: not yet initiated 07/12/2022: Completing theraputty exercises independent at home Goal status: Met/Achieved  LONG TERM GOALS: Target date: 08/18/22 (12 weeks)  Pt will increase FOTO score to (TBD) or better to indicate improvement in self perceived functional use of the R arm with daily tasks. Baseline: Eval: TBD 07/12/2022: TBD Goal status: INITIAL  2.  Pt will increase R grip strength by 5 or more lbs to more easily hold and stabilize ADL supplies in the R dominant  hand. Baseline: R grip 16 lbs; limited from PD but also by 5th digit PIP flexion contracture, as this digit can not engage in gripping (L 38 lbs) 07/12/2022: Grip strength: Right: 16 lbs; limited from PD but also by 5th digit PIP flexion contracture, as this digit still can not engage in gripping (L 40 lbs) Goal status: Ongoing  3.  Pt will improve R hand FMC/dexterity skills to be able to sign his name on medical/legal documents with 75% legibility, using adapted writing aids as needed. Baseline: Eval: <25% legible and pt verbalizes embarrassment when attempting to sign his name on forms 07/12/2022: 75% legibility when writing his name in print, however reports he is still not satisfied with his signature looks. Goal status: Ongoing  4.  Pt will increase R FMC/GMC skills to engage the RUE into UB ADLs at least 50% of the time. Baseline: Eval: Pt uses the L non-dominant arm for self care tasks. 07/12/22: Pt. Reports that he has been trying to incorporate his R hand while still using the L arm to complete all self care tasks.  Goal status: Ongoing  ASSESSMENT:  CLINICAL IMPRESSION:  Measurements were obtained and goals were reviewed with the Pt. Pt. Has shown progress with improved strength/ROM in the LUE/RUE and improved FMC/dexterity skills. Pt. tolerated 6.6# on the R side and 11.2# on the L using the hand grip strengthener to remove jumbo pegs from pegboard. Pt. continues to present with decreased  grip on the R d/t R Small Finger flexion contracture preventing this digit from engaging in gripping on this hand. Pt. continues to benefit from skilled OT for increasing bilateral hand strength, improve coordination, specifically working to increase engagement of the RUE during self care tasks.    PERFORMANCE DEFICITS: in functional skills including ADLs, IADLs, coordination, dexterity, ROM, strength, flexibility, Fine motor control, Gross motor control, mobility, balance, body mechanics, endurance,  decreased knowledge of use of DME, and UE functional use, cognitive skills including emotional and memory, and psychosocial skills including coping strategies, environmental adaptation, habits, and routines and behaviors.   IMPAIRMENTS: are limiting patient from ADLs, IADLs, and leisure.   CO-MORBIDITIES: has co-morbidities such as anxiety, depression  that affects occupational performance. Patient will benefit  from skilled OT to address above impairments and improve overall function.  MODIFICATION OR ASSISTANCE TO COMPLETE EVALUATION: No modification of tasks or assist necessary to complete an evaluation.  OT OCCUPATIONAL PROFILE AND HISTORY: Problem focused assessment: Including review of records relating to presenting problem.  CLINICAL DECISION MAKING: Moderate - several treatment options, min-mod task modification necessary  REHAB POTENTIAL: Good for goals  EVALUATION COMPLEXITY: Moderate    PLAN:  OT FREQUENCY: 2x/week  OT DURATION: 12 weeks  PLANNED INTERVENTIONS: self care/ADL training, therapeutic exercise, therapeutic activity, neuromuscular re-education, manual therapy, passive range of motion, balance training, functional mobility training, moist heat, cryotherapy, patient/family education, cognitive remediation/compensation, psychosocial skills training, energy conservation, coping strategies training, and DME and/or AE instructions  RECOMMENDED OTHER SERVICES: None at this time  CONSULTED AND AGREED WITH PLAN OF CARE: Patient  PLAN FOR NEXT SESSION: initiate HEP, complete Sudie Grumbling, OTS 07/13/2022 9:00 AM  This entire session was performed under the direct supervision and direction of a licensed therapist. I have personally read, edited, and approve of the note as written.  Olegario Messier, MS, OTR/L   07/13/2022

## 2022-07-14 ENCOUNTER — Ambulatory Visit: Payer: Medicare Other | Admitting: Physical Therapy

## 2022-07-14 ENCOUNTER — Ambulatory Visit: Payer: Medicare Other | Admitting: Occupational Therapy

## 2022-07-14 ENCOUNTER — Encounter: Payer: Self-pay | Admitting: Physical Therapy

## 2022-07-14 DIAGNOSIS — M6281 Muscle weakness (generalized): Secondary | ICD-10-CM

## 2022-07-14 DIAGNOSIS — R2689 Other abnormalities of gait and mobility: Secondary | ICD-10-CM

## 2022-07-14 DIAGNOSIS — R2681 Unsteadiness on feet: Secondary | ICD-10-CM

## 2022-07-14 DIAGNOSIS — R262 Difficulty in walking, not elsewhere classified: Secondary | ICD-10-CM | POA: Diagnosis not present

## 2022-07-14 DIAGNOSIS — R278 Other lack of coordination: Secondary | ICD-10-CM

## 2022-07-14 NOTE — Therapy (Signed)
Outpatient Physical Therapy Treatment    Patient Details  Name: Charles Stanton MRN: [id number] Date of Birth: [birth year] Referring Provider (PT): Dr. Cristopher Peru   Encounter Date: 07/14/2022  END OF SESSION:    PT End of Session - 07/14/22 1314     Visit Number 37    Number of Visits 40    Date for PT Re-Evaluation 07/19/22    Authorization Type Medicare A & B primary; federal employee secondary    Authorization Time Period -07/19/22    Progress Note Due on Visit 40    PT Start Time 1317    PT Stop Time 1400    PT Time Calculation (min) 43 min    Equipment Utilized During Treatment Gait belt    Activity Tolerance Patient tolerated treatment well;No increased pain    Behavior During Therapy [facility] for tasks assessed/performed                   Past Medical History:  Diagnosis Date   Abnormal liver function    Anemia    Anxiety    Atrial fibrillation (HCC)    Cancer (HCC) 2017   skin   Colonic mass    Depression    Dizziness    Dry cough    History of colon polyps    Hypercholesteremia    Hyperlipemia    Hypertension    Palpitations    Parkinson's disease    Parkinson's disease    PVD (peripheral vascular disease) (HCC)    Tonsillitis 03/25/2013   Tremors of nervous system     Past Surgical History:  Procedure Laterality Date   BACK SURGERY     Kyphoplasty   March 2018   COLONOSCOPY     COLONOSCOPY WITH PROPOFOL N/A 12/12/2017   Procedure: COLONOSCOPY WITH PROPOFOL;  Surgeon: Wyline Mood, MD;  Location: [facility] ENDOSCOPY;  Service: Gastroenterology;  Laterality: N/A;   ESOPHAGOGASTRODUODENOSCOPY N/A 12/11/2017   Procedure: ESOPHAGOGASTRODUODENOSCOPY (EGD);  Surgeon: Wyline Mood, MD;  Location: [facility] ENDOSCOPY;  Service: Gastroenterology;  Laterality: N/A;   ESOPHAGOGASTRODUODENOSCOPY N/A 10/08/2021   Procedure: ESOPHAGOGASTRODUODENOSCOPY (EGD);  Surgeon: Regis Bill, MD;  Location: [facility] ENDOSCOPY;  Service: Endoscopy;  Laterality: N/A;    ESOPHAGOGASTRODUODENOSCOPY (EGD) WITH PROPOFOL N/A 09/21/2016   Procedure: ESOPHAGOGASTRODUODENOSCOPY (EGD) WITH PROPOFOL;  Surgeon: Wyline Mood, MD;  Location: [facility] ENDOSCOPY;  Service: Gastroenterology;  Laterality: N/A;   ESOPHAGOGASTRODUODENOSCOPY (EGD) WITH PROPOFOL N/A 11/07/2016   Procedure: ESOPHAGOGASTRODUODENOSCOPY (EGD) WITH PROPOFOL;  Surgeon: Wyline Mood, MD;  Location: [facility] ENDOSCOPY;  Service: Gastroenterology;  Laterality: N/A;   ESOPHAGOGASTRODUODENOSCOPY (EGD) WITH PROPOFOL N/A 10/15/2019   Procedure: ESOPHAGOGASTRODUODENOSCOPY (EGD) WITH PROPOFOL;  Surgeon: Regis Bill, MD;  Location: [facility] ENDOSCOPY;  Service: Endoscopy;  Laterality: N/A;   ESOPHAGOGASTRODUODENOSCOPY (EGD) WITH PROPOFOL N/A 10/28/2019   Procedure: ESOPHAGOGASTRODUODENOSCOPY (EGD) WITH PROPOFOL;  Surgeon: Regis Bill, MD;  Location: [facility] ENDOSCOPY;  Service: Endoscopy;  Laterality: N/A;   ESOPHAGOGASTRODUODENOSCOPY (EGD) WITH PROPOFOL N/A 12/02/2019   Procedure: ESOPHAGOGASTRODUODENOSCOPY (EGD) WITH PROPOFOL;  Surgeon: Regis Bill, MD;  Location: [facility] ENDOSCOPY;  Service: Endoscopy;  Laterality: N/A;   ESOPHAGOGASTRODUODENOSCOPY (EGD) WITH PROPOFOL N/A 02/18/2020   Procedure: ESOPHAGOGASTRODUODENOSCOPY (EGD) WITH PROPOFOL;  Surgeon: Regis Bill, MD;  Location: [facility] ENDOSCOPY;  Service: Endoscopy;  Laterality: N/A;   ESOPHAGOGASTRODUODENOSCOPY (EGD) WITH PROPOFOL N/A 08/02/2021   Procedure: ESOPHAGOGASTRODUODENOSCOPY (EGD) WITH PROPOFOL;  Surgeon: Regis Bill, MD;  Location: [facility] ENDOSCOPY;  Service: Endoscopy;  Laterality: N/A;  REQUEST AM  ESOPHAGOGASTRODUODENOSCOPY (EGD) WITH PROPOFOL N/A 10/05/2021   Procedure: ESOPHAGOGASTRODUODENOSCOPY (EGD) WITH PROPOFOL;  Surgeon: Regis Bill, MD;  Location: [facility] ENDOSCOPY;  Service: Endoscopy;  Laterality: N/A;   FRACTURE SURGERY Right 05/28/2019   distal radius fracture   HERNIA REPAIR     left inguinal hernia repair as an  infant   KYPHOPLASTY N/A 02/11/2016   Procedure: KYPHOPLASTY  L2,T9;  Surgeon: Kennedy Bucker, MD;  Location: [facility] ORS;  Service: Orthopedics;  Laterality: N/A;   KYPHOPLASTY N/A 03/15/2016   Procedure: KYPHOPLASTY L3;  Surgeon: Kennedy Bucker, MD;  Location: [facility] ORS;  Service: Orthopedics;  Laterality: N/A;   OPEN REDUCTION INTERNAL FIXATION (ORIF) DISTAL RADIAL FRACTURE Right 05/28/2019   Procedure: OPEN REDUCTION INTERNAL FIXATION (ORIF) DISTAL RADIAL FRACTURE;  Surgeon: Kennedy Bucker, MD;  Location: [facility] ORS;  Service: Orthopedics;  Laterality: Right;   skin cancer removed     TONSILLECTOMY       Subjective Assessment -      Subjective  Pt notes he is doing well today. No significant changes since last session.    Pertinent History Charles Stanton is a 66yoM who was prescribed with Parkinson's Disease ~ 10 years.  Pt is followed by neurology, reports good reponse to medications which help control his tremors somewhat. Pt uses a SPC for AMB, also uses a walker for longer distance walking. Pt goes into community almost daily (they like to eat out) which includes lots of SPC [place] eand navigation of 4 stairs at home.    Currently in Pain? No/denies             TREATMENT: Unless otherwise stated, CGA was provided and gait belt donned in order to ensure pt safety   NMR:  All ambulation done with metronome placed at 54 bpm for sequencing and gait speed.  -Octane warm-up level 2 x 5 min, level 1 x 1 min  -Seated PWR!: Up, Rock, twist, step 1 x 10.   -Boxing: -Seated cross body punches x 3 alternating hits then pt ducking from therapist swing, done x 10 .  -ambulation to [facility] and back x 1100 feet with no rest break, able to keep good sequencing throughout.  -Boxing: -Seated cross body punches x 2 hits one arm then 1 hit with the other before having pt ducking from therapist swing, done x 10.  -Seated PWR! Up to standing up flow: 2 x 10, cues given on 2nd set to hold position  for 3-4 seconds  -Standing PWR!: Standing step, rock, twist x 10 additional stepping onto airex pad, and reach. X 10 each  -Boxing:Seated cross body punches x 3 hits then pt ducking from therapist swing, done 2 x 10 pt reports this exercise as challenging but enjoys the activity.    PWR! Up works on postural strengthening and antigravity extension, PRW! Rock working on [employer] shifting, PRW! Twist targeting trunk rotation and PRW! Step targeting transition movements. PWR! Moves target bradykinesia, rigidity, and dyskinesia through targeted functional movements that address four core movement difficulties for people with Parkinson's disease.      PT Education -     Education Details Pt educated throughout session about proper posture and technique with exercises. Improved exercise technique, movement at target joints, use of target muscles after min to mod verbal, visual, tactile cues.      Person(s) Educated Patient    Methods Explanation;Demonstration ; handout   Comprehension Verbalized understanding;Returned demonstration;Need further instruction  PT Short Term Goals -       PT SHORT TERM GOAL #1   Title Pt to report regular performance of exercises prescribed for home and a sense of improvements in strength and balance AEB them not being as challenging anymore.    Baseline Will issue on visit 2    Time 4    Period Weeks    Status met   Target Date 03/15/22      PT SHORT TERM GOAL #2   Title Pt to demonstrate improved power AEB improved 5xSTS hands free in <18sec.    Baseline 03/01/22: 22sec hands free, author provideing foot block bilat 03/31/22: 14.82sec 4/23:17.3 sec   Time 4    Period Weeks    Status Met    Target Date 03/29/22               PT Long Term Goals - Target goal date for all remaining long term goals is 07/19/2022        PT LONG TERM GOAL #1   Title Pt to improve score on FOTO survey to 57 to indicate reduced difficulty with  basic mobility required for ADL performance.    Baseline 03/01/22:49 04/29/22:56%. 06/16/22: 60   Time 8    Period Weeks    Status Met          PT LONG TERM GOAL #2   Title Pt to improve tolerance to overground AMB c SPC AEB performance >86ft.    Baseline 2/27: fatigued after 482ft lap performed in four minutes ten seconds. 03/31/22: 778ft with SPC no rest break 04/26/22: 794 ft 06/16/22: 903 ft with SPC, no rest.   Time 8    Period Weeks    Status Met         PT LONG TERM GOAL #3   Title Pt to demonstrate improved leg power AEB 5xSTS from chair <14sec hands free and without need for minGuard assist for safety and without need for foot block.    Baseline 03/01/22: 22sec hands free, feet blocked, minGuard assist  03/31/22: 14.82sec  4/23:17.3 sec 06/16/22: 13.87 sec   Time 8     Period Weeks    Status MET/ONGOING         PT LONG TERM GOAL #4   Title Pt to demonstrate improvement in balance performance AEB >6 improvement BERG Balance for balance assessment.    Baseline 41 on 03/03/22. 3/38/24: 42 4/23: 45 06/16/22: 48   Time 8    Period Weeks    Status Met          5.  Pt will improve distance with LRAD to 900 feet or greater in order to indicate further progress to community ambulation distances Baseline: 794 ft with SPC , 06/16/22: 903 ft with SPC. Goal status: Met/ongoing (continue to assess to ensure maintenance of progress)  6.  Patient will improve modified DGI (with use of cane) score > 4 points or greater in order to indicate improved balance and decreased risk of falls.  Baseline: 6/18: 11 Goal status: INITIAL      Plan     Clinical Impression Statement Patient appeared motivated and ready for treatment on this day. Pt improving with all tasks and functional endurance today, evidenced by no rest break during long distance ambulation bout. Also improving with dynamics movements within PWR! Moves,  showing greater motion and momentum. Pt will continue to benefit from  skilled physical therapy intervention to address impairments, improve QOL,  and attain therapy goals.     Personal Factors and Comorbidities Time since onset of injury/illness/exacerbation;Past/Current Experience    Examination-Activity Limitations Locomotion Level;Transfers;Bed Mobility;Reach Overhead;Hygiene/Grooming;Dressing;Toileting;Stairs;Bend    Stability/Clinical Decision Making Evolving/Moderate complexity    Clinical Decision Making Moderate    Rehab Potential Good    PT Frequency 2x / week    PT Duration 8 weeks    PT Treatment/Interventions ADLs/Self Care Home Management;Moist Heat;Gait training;Stair training;Functional mobility training;Therapeutic activities;Therapeutic exercise;Balance training;Neuromuscular re-education;Patient/family education    PT Next Visit Plan .  STS PWR! Up with eventual addition of PWR! Step. Work on weight shift cues with STS. Dynamic balance/gait training as indicated.    PT Home Exercise Plan 03/01/22: discussed finding and elevated sit surface for future STS exercises (something ~18-19 inches high)    Consulted and Agree with Plan of Care Patient             Patient will benefit from skilled therapeutic intervention in order to improve the following deficits and impairments:  balance, strength, coordination, gait, transfers. Posture. ROM   Visit Diagnosis: Muscle weakness (generalized)  Other lack of coordination  Difficulty in walking, not elsewhere classified  Other abnormalities of gait and mobility  Unsteadiness on feet    Problem List Patient Active Problem List   Diagnosis Date Noted   Acute respiratory failure due to COVID-19 (HCC) 05/28/2019   Alcohol dependence with uncomplicated withdrawal (HCC) 01/23/2018   Alcohol withdrawal (HCC) 01/23/2018   GIB (gastrointestinal bleeding) 12/09/2017   A-fib (HCC) 12/09/2017   Esophageal obstruction 10/10/2016   Esophageal ulceration 10/10/2016   GI bleed 09/20/2016   Low vitamin  B12 level 03/18/2016   S/P kyphoplasty 03/18/2016   Basal cell carcinoma 02/08/2016   Atrial fibrillation (HCC) 01/27/2016   Colitis 01/27/2016   HLD (hyperlipidemia) 01/27/2016   Anxiety 01/27/2016   Hypokalemia 01/27/2016   Major depressive disorder, recurrent episode, moderate (HCC) 04/14/2015   Anxiety, generalized 12/06/2013   Parkinson disease 08/07/2013   Dysphagia 05/12/2013   Esophageal mass 05/12/2013   GERD (gastroesophageal reflux disease) 05/12/2013   Vomiting 05/12/2013   Open bite of lower leg 05/10/2013   Dog bite of lower leg 05/10/2013   Abnormal finding on liver function 03/25/2013   Benign neoplasm 03/25/2013   Dizziness 03/25/2013   Type 2 diabetes mellitus (HCC) 03/25/2013   BP (high blood pressure) 03/25/2013   Awareness of heartbeats 03/25/2013   Pure hypercholesterolemia 03/25/2013   Infective tonsillitis 03/25/2013   Adenomatous polyp 03/25/2013      Cecile Sheerer, SPT   07/14/22, 2:43 PM

## 2022-07-14 NOTE — Therapy (Signed)
OUTPATIENT OCCUPATIONAL THERAPY NEURO PROGRESS NOTE 05/26/2022-07/13/2022  Patient Name: Charles Stanton MRN: [id number] DOB:05/15/1955, 67 y.o., male Today's Date: 07/14/2022  PCP: Dr. Barbette Reichmann REFERRING PROVIDER: Dr. Cristopher Peru  END OF SESSION:  OT End of Session - 07/14/22 1407     Visit Number 11    Number of Visits 24    Date for OT Re-Evaluation 08/18/22    OT Start Time 1405    OT Stop Time 1445    OT Time Calculation (min) 40 min    Activity Tolerance Patient tolerated treatment well    Behavior During Therapy [facility] for tasks assessed/performed             Past Medical History:  Diagnosis Date   Abnormal liver function    Anemia    Anxiety    Atrial fibrillation (HCC)    Cancer (HCC) 2017   skin   Colonic mass    Depression    Dizziness    Dry cough    History of colon polyps    Hypercholesteremia    Hyperlipemia    Hypertension    Palpitations    Parkinson's disease    Parkinson's disease    PVD (peripheral vascular disease) (HCC)    Tonsillitis 03/25/2013   Tremors of nervous system    Past Surgical History:  Procedure Laterality Date   BACK SURGERY     Kyphoplasty   March 2018   COLONOSCOPY     COLONOSCOPY WITH PROPOFOL N/A 12/12/2017   Procedure: COLONOSCOPY WITH PROPOFOL;  Surgeon: Wyline Mood, MD;  Location: [facility] ENDOSCOPY;  Service: Gastroenterology;  Laterality: N/A;   ESOPHAGOGASTRODUODENOSCOPY N/A 12/11/2017   Procedure: ESOPHAGOGASTRODUODENOSCOPY (EGD);  Surgeon: Wyline Mood, MD;  Location: [facility] ENDOSCOPY;  Service: Gastroenterology;  Laterality: N/A;   ESOPHAGOGASTRODUODENOSCOPY N/A 10/08/2021   Procedure: ESOPHAGOGASTRODUODENOSCOPY (EGD);  Surgeon: Regis Bill, MD;  Location: [facility] ENDOSCOPY;  Service: Endoscopy;  Laterality: N/A;   ESOPHAGOGASTRODUODENOSCOPY (EGD) WITH PROPOFOL N/A 09/21/2016   Procedure: ESOPHAGOGASTRODUODENOSCOPY (EGD) WITH PROPOFOL;  Surgeon: Wyline Mood, MD;  Location: [facility] ENDOSCOPY;  Service:  Gastroenterology;  Laterality: N/A;   ESOPHAGOGASTRODUODENOSCOPY (EGD) WITH PROPOFOL N/A 11/07/2016   Procedure: ESOPHAGOGASTRODUODENOSCOPY (EGD) WITH PROPOFOL;  Surgeon: Wyline Mood, MD;  Location: [facility] ENDOSCOPY;  Service: Gastroenterology;  Laterality: N/A;   ESOPHAGOGASTRODUODENOSCOPY (EGD) WITH PROPOFOL N/A 10/15/2019   Procedure: ESOPHAGOGASTRODUODENOSCOPY (EGD) WITH PROPOFOL;  Surgeon: Regis Bill, MD;  Location: [facility] ENDOSCOPY;  Service: Endoscopy;  Laterality: N/A;   ESOPHAGOGASTRODUODENOSCOPY (EGD) WITH PROPOFOL N/A 10/28/2019   Procedure: ESOPHAGOGASTRODUODENOSCOPY (EGD) WITH PROPOFOL;  Surgeon: Regis Bill, MD;  Location: [facility] ENDOSCOPY;  Service: Endoscopy;  Laterality: N/A;   ESOPHAGOGASTRODUODENOSCOPY (EGD) WITH PROPOFOL N/A 12/02/2019   Procedure: ESOPHAGOGASTRODUODENOSCOPY (EGD) WITH PROPOFOL;  Surgeon: Regis Bill, MD;  Location: [facility] ENDOSCOPY;  Service: Endoscopy;  Laterality: N/A;   ESOPHAGOGASTRODUODENOSCOPY (EGD) WITH PROPOFOL N/A 02/18/2020   Procedure: ESOPHAGOGASTRODUODENOSCOPY (EGD) WITH PROPOFOL;  Surgeon: Regis Bill, MD;  Location: [facility] ENDOSCOPY;  Service: Endoscopy;  Laterality: N/A;   ESOPHAGOGASTRODUODENOSCOPY (EGD) WITH PROPOFOL N/A 08/02/2021   Procedure: ESOPHAGOGASTRODUODENOSCOPY (EGD) WITH PROPOFOL;  Surgeon: Regis Bill, MD;  Location: [facility] ENDOSCOPY;  Service: Endoscopy;  Laterality: N/A;  REQUEST AM   ESOPHAGOGASTRODUODENOSCOPY (EGD) WITH PROPOFOL N/A 10/05/2021   Procedure: ESOPHAGOGASTRODUODENOSCOPY (EGD) WITH PROPOFOL;  Surgeon: Regis Bill, MD;  Location: [facility] ENDOSCOPY;  Service: Endoscopy;  Laterality: N/A;   FRACTURE SURGERY Right 05/28/2019   distal radius fracture   HERNIA REPAIR     left  inguinal hernia repair as an infant   KYPHOPLASTY N/A 02/11/2016   Procedure: KYPHOPLASTY  L2,T9;  Surgeon: Kennedy Bucker, MD;  Location: [facility] ORS;  Service: Orthopedics;  Laterality: N/A;   KYPHOPLASTY N/A 03/15/2016    Procedure: KYPHOPLASTY L3;  Surgeon: Kennedy Bucker, MD;  Location: [facility] ORS;  Service: Orthopedics;  Laterality: N/A;   OPEN REDUCTION INTERNAL FIXATION (ORIF) DISTAL RADIAL FRACTURE Right 05/28/2019   Procedure: OPEN REDUCTION INTERNAL FIXATION (ORIF) DISTAL RADIAL FRACTURE;  Surgeon: Kennedy Bucker, MD;  Location: [facility] ORS;  Service: Orthopedics;  Laterality: Right;   skin cancer removed     TONSILLECTOMY     Patient Active Problem List   Diagnosis Date Noted   Acute respiratory failure due to COVID-19 (HCC) 05/28/2019   Alcohol dependence with uncomplicated withdrawal (HCC) 01/23/2018   Alcohol withdrawal (HCC) 01/23/2018   GIB (gastrointestinal bleeding) 12/09/2017   A-fib (HCC) 12/09/2017   Esophageal obstruction 10/10/2016   Esophageal ulceration 10/10/2016   GI bleed 09/20/2016   Low vitamin B12 level 03/18/2016   S/P kyphoplasty 03/18/2016   Basal cell carcinoma 02/08/2016   Atrial fibrillation (HCC) 01/27/2016   Colitis 01/27/2016   HLD (hyperlipidemia) 01/27/2016   Anxiety 01/27/2016   Hypokalemia 01/27/2016   Major depressive disorder, recurrent episode, moderate (HCC) 04/14/2015   Anxiety, generalized 12/06/2013   Parkinson disease 08/07/2013   Dysphagia 05/12/2013   Esophageal mass 05/12/2013   GERD (gastroesophageal reflux disease) 05/12/2013   Vomiting 05/12/2013   Open bite of lower leg 05/10/2013   Dog bite of lower leg 05/10/2013   Abnormal finding on liver function 03/25/2013   Benign neoplasm 03/25/2013   Dizziness 03/25/2013   Type 2 diabetes mellitus (HCC) 03/25/2013   BP (high blood pressure) 03/25/2013   Awareness of heartbeats 03/25/2013   Pure hypercholesterolemia 03/25/2013   Infective tonsillitis 03/25/2013   Adenomatous polyp 03/25/2013   ONSET DATE: 10 years   REFERRING DIAG: Parkinson's Disease  THERAPY DIAG:  Muscle weakness (generalized)  Rationale for Evaluation and Treatment: Rehabilitation  SUBJECTIVE:  SUBJECTIVE STATEMENT: Pt  reports he is doing well today.  Pt accompanied by: self; spouse present in lobby, but did not follow for eval  PERTINENT HISTORY: Pt has been working with outpatient PT here at [facility] since end of Feb 2024.  OT ordered d/t worsening PD symptoms contributing to functional decline with ADLs.   PRECAUTIONS: Fall  WEIGHT BEARING RESTRICTIONS: No  PAIN:  Are you having pain? No  FALLS: Has patient fallen in last 6 months? Yes. Number of falls 1  LIVING ENVIRONMENT: Lives with: lives with their spouse Lives in: split level but pt remains on main level to avoid steps Stairs: Yes: Internal: 2 steps; can reach both Has following equipment at home: Quad cane small base, Walker - 4 wheeled, bed side commode, Grab bars, and pull down seat for shower, hand held shower hose,   PLOF: Needs assistance with ADLs; pt reports that spouse assists but is consistent to always let pt try things first before she automatically helps.    PATIENT GOALS: To be more indep  OBJECTIVE:  HAND DOMINANCE: Right (most affected side from PD)  ADLs: Overall ADLs: spouse is primary caregiver Transfers/ambulation related to ADLs: uses RW Eating: spouse cuts food; pt reports having to use L non-dominant hand to eat for the last 5-6 yrs Grooming: set up; assist to squeeze toothpaste and denture paste; uses L non-dominant hand UB Dressing: set up; assist with clothing fasteners LB Dressing: Min A; assist  with socks, dons slip on shoes with set up (has tried a sockaid but was unsuccessful) Toileting: modified indep Bathing: Set up/min A; spouse squeezes shampoo into pt's hands, spouse helps to towel dry  Tub Shower transfers: distant supv Equipment:  see above   IADLs: Shopping: pt can accompany spouse by pushing a shopping cart.  Light housekeeping: pt can drag trash to bin and can roll the bin to the road and back Meal Prep: spouse manages  Community mobility: spouse drives; uses rollator for longer distances   Medication management: spouse manages pills using weekly pill organizer.   Financial management: spouse manages  Handwriting: Mild micrographia and <25% legible  MOBILITY STATUS: Hx of falls  POSTURE COMMENTS:  rounded shoulders and forward head, R shoulder elevated  ACTIVITY TOLERANCE: Activity tolerance: TBD and assessed further with functional activities   FUNCTIONAL OUTCOME MEASURES: FOTO: TBD   UPPER EXTREMITY ROM:    Active ROM Right eval Left eval Right 07/12/2022 Left 07/12/2022  Shoulder flexion 118 85 120 117  Shoulder abduction 128 124 132 127  Shoulder adduction      Shoulder extension      Shoulder internal rotation [facility] WFL  Shoulder external rotation [facility] WFL  Elbow flexion      Elbow extension      Wrist flexion 55 76 59 75  Wrist extension 57 75 61 71  Wrist ulnar deviation      Wrist radial deviation      Wrist pronation [facility] WFL WFL  Wrist supination 63 75 66 81  (Blank rows = not tested)  UPPER EXTREMITY MMT:     MMT Right eval Left eval Right 07/12/2022 Left 07/12/2022  Shoulder flexion 4 4 4+ 4+  Shoulder abduction 4+ 4+ 4+ 4+  Shoulder adduction      Shoulder extension      Shoulder internal rotation      Shoulder external rotation      Middle trapezius      Lower trapezius      Elbow flexion 4+ 4+ 4+ 4+  Elbow extension 4+ 4+ 4+ 4+  Wrist flexion 4+ 4+ 4+ 4+  Wrist extension 4+ 4+ 4+ 4+  Wrist ulnar deviation      Wrist radial deviation      Wrist pronation      Wrist supination      (Blank rows = not tested)  HAND FUNCTION: Grip strength: Right: 16 lbs; Left: 38 lbs, Lateral pinch: Right: 10 lbs, Left: 14 lbs, and 3 point pinch: Right: 9 lbs, Left: 11 lbs 07/12/2022: Grip strength: Right: 16 lbs; Left: 40 lbs, Lateral pinch: Right: 10 lbs, Left: 14 lbs, and 3 point pinch: Right: 9 lbs, Left: 15 lbs COORDINATION: 9 Hole Peg test: Right: 38 sec; Left: 40 sec 07/12/2022: 9 Hole Peg test: Right: 35 sec; Left: 36  sec SENSATION: WFL  EDEMA: None  MUSCLE TONE: R/L normal  COGNITION: Overall cognitive status: Within functional limits for tasks assessed  VISION: Subjective report: wears glasses all the time    PRAXIS: Impaired: Motor planning  OBSERVATIONS:  R SF PIP flexion contracture 105* from 5-6 years ago from a fall  TODAY'S TREATMENT:  DATE: 07/14/22:  Therapeutic Exercise:  Pt. worked on [employer] with use of a Scientist, forensic.9## on the R side for the first 7 pegs, then adjusted to 11.2# then to 6.6# for the remaining 6.6#, and 17.9# on the left  x's 2 trials using the hand grip strengthener to remove jumbo pegs from pegboard. Intermittent min vc and tactile cues for set up of hand gripper in hand to maximize closure with each gripping rep. Pt. performed resistive EZ Board exercises for forearm supination/pronation using gross grasp, and lateral pinch (key) grasp. Pt. worked on grasping 1" resistive cubes alternating thumb opposition to the tip of the 2nd and 3rd digits while the board is placed at a vertical angle. Pt. worked on pressing the cubes back into place while alternating isolated 2nd through 5th digit extension.   PATIENT EDUCATION: Education details: Potential benefit of wrist weight to minimize UE tremor Person educated: Patient Education method: Explanation and Verbal cues, demo Education comprehension: verbalized understanding, demonstrated understanding  HOME EXERCISE PROGRAM: Theraputty   GOALS: Goals reviewed with patient? Yes  SHORT TERM GOALS: Target date: 07/07/22 (6 weeks)  Pt will be indep to perform HEP for improving bilat hand strength and coordination skills. Baseline: Eval: not yet initiated 07/12/2022: Completing theraputty exercises independent at home Goal status: Met/Achieved  LONG TERM GOALS: Target date:  08/18/22 (12 weeks)  Pt will increase FOTO score to (TBD) or better to indicate improvement in self perceived functional use of the R arm with daily tasks. Baseline: Eval: TBD 07/12/2022: TBD Goal status: INITIAL  2.  Pt will increase R grip strength by 5 or more lbs to more easily hold and stabilize ADL supplies in the R dominant hand. Baseline: R grip 16 lbs; limited from PD but also by 5th digit PIP flexion contracture, as this digit can not engage in gripping (L 38 lbs) 07/12/2022: Grip strength: Right: 16 lbs; limited from PD but also by 5th digit PIP flexion contracture, as this digit still can not engage in gripping (L 40 lbs) Goal status: Ongoing  3.  Pt will improve R hand FMC/dexterity skills to be able to sign his name on medical/legal documents with 75% legibility, using adapted writing aids as needed. Baseline: Eval: <25% legible and pt verbalizes embarrassment when attempting to sign his name on forms 07/12/2022: 75% legibility when writing his name in print, however reports he is still not satisfied with his signature looks. Goal status: Ongoing  4.  Pt will increase R FMC/GMC skills to engage the RUE into UB ADLs at least 50% of the time. Baseline: Eval: Pt uses the L non-dominant arm for self care tasks. 07/12/22: Pt. Reports that he has been trying to incorporate his R hand while still using the L arm to complete all self care tasks.  Goal status: Ongoing  ASSESSMENT:  CLINICAL IMPRESSION:  Measurements were obtained and goals were reviewed with the Pt. Pt. has shown progress with improved strength/ROM in the LUE/RUE and improved FMC/dexterity skills. Pt. tolerated 17.9## on the R side for the first 7 pegs, then adjusted to 11.2# then to 6.6# for the remaining 6.6#, and 17.9# on the left  x's 2 trials using the hand grip strengthener to remove jumbo pegs from pegboard. Pt. Presents with decreased grip strength, and pinch strength. Pt. Reports having difficulty with 3pt. pinch  strength. Pt. continues to present with decreased  grip on the R d/t R Small Finger flexion contracture preventing this digit from engaging  in gripping on this hand. Pt. continues to benefit from skilled OT for increasing bilateral hand strength, improve coordination, specifically working to increase engagement of the RUE during self care tasks.    PERFORMANCE DEFICITS: in functional skills including ADLs, IADLs, coordination, dexterity, ROM, strength, flexibility, Fine motor control, Gross motor control, mobility, balance, body mechanics, endurance, decreased knowledge of use of DME, and UE functional use, cognitive skills including emotional and memory, and psychosocial skills including coping strategies, environmental adaptation, habits, and routines and behaviors.   IMPAIRMENTS: are limiting patient from ADLs, IADLs, and leisure.   CO-MORBIDITIES: has co-morbidities such as anxiety, depression  that affects occupational performance. Patient will benefit from skilled OT to address above impairments and improve overall function.  MODIFICATION OR ASSISTANCE TO COMPLETE EVALUATION: No modification of tasks or assist necessary to complete an evaluation.  OT OCCUPATIONAL PROFILE AND HISTORY: Problem focused assessment: Including review of records relating to presenting problem.  CLINICAL DECISION MAKING: Moderate - several treatment options, min-mod task modification necessary  REHAB POTENTIAL: Good for goals  EVALUATION COMPLEXITY: Moderate    PLAN:  OT FREQUENCY: 2x/week  OT DURATION: 12 weeks  PLANNED INTERVENTIONS: self care/ADL training, therapeutic exercise, therapeutic activity, neuromuscular re-education, manual therapy, passive range of motion, balance training, functional mobility training, moist heat, cryotherapy, patient/family education, cognitive remediation/compensation, psychosocial skills training, energy conservation, coping strategies training, and DME and/or AE  instructions  RECOMMENDED OTHER SERVICES: None at this time  CONSULTED AND AGREED WITH PLAN OF CARE: Patient  PLAN FOR NEXT SESSION: initiate HEP, complete Darrel Hoover, MS, OTR/L   07/14/2022

## 2022-07-19 ENCOUNTER — Ambulatory Visit: Payer: Medicare Other | Admitting: Occupational Therapy

## 2022-07-19 ENCOUNTER — Ambulatory Visit: Payer: Medicare Other | Admitting: Physical Therapy

## 2022-07-19 DIAGNOSIS — R262 Difficulty in walking, not elsewhere classified: Secondary | ICD-10-CM

## 2022-07-19 DIAGNOSIS — R278 Other lack of coordination: Secondary | ICD-10-CM

## 2022-07-19 DIAGNOSIS — M6281 Muscle weakness (generalized): Secondary | ICD-10-CM

## 2022-07-19 DIAGNOSIS — R2689 Other abnormalities of gait and mobility: Secondary | ICD-10-CM

## 2022-07-19 DIAGNOSIS — R2681 Unsteadiness on feet: Secondary | ICD-10-CM

## 2022-07-19 NOTE — Therapy (Unsigned)
Outpatient Physical Therapy Treatment/ Re-Cert/Progress note    Patient Details  Name: Charles Stanton MRN: [id number] Date of Birth: 05-16-1955 Referring Provider (PT): Dr. Cristopher Peru   Encounter Date: 07/19/2022  END OF SESSION:    PT End of Session - 07/19/22 1309     Visit Number 38    Number of Visits 40    Date for PT Re-Evaluation 07/19/22    Authorization Type Medicare A & B primary; federal employee secondary    Authorization Time Period -07/19/22    Progress Note Due on Visit 40    PT Start Time 1315    PT Stop Time 1400    PT Time Calculation (min) 45 min    Equipment Utilized During Treatment Gait belt    Activity Tolerance Patient tolerated treatment well;No increased pain    Behavior During Therapy Charles Stanton for tasks assessed/performed                    Past Medical History:  Diagnosis Date   Abnormal liver function    Anemia    Anxiety    Atrial fibrillation (HCC)    Cancer (HCC) 2017   skin   Colonic mass    Depression    Dizziness    Dry cough    History of colon polyps    Hypercholesteremia    Hyperlipemia    Hypertension    Palpitations    Parkinson's disease    Parkinson's disease    PVD (peripheral vascular disease) (HCC)    Tonsillitis 03/25/2013   Tremors of nervous system     Past Surgical History:  Procedure Laterality Date   BACK SURGERY     Kyphoplasty   March 2018   COLONOSCOPY     COLONOSCOPY WITH PROPOFOL N/A 12/12/2017   Procedure: COLONOSCOPY WITH PROPOFOL;  Surgeon: Wyline Mood, MD;  Location: [facility] ENDOSCOPY;  Service: Gastroenterology;  Laterality: N/A;   ESOPHAGOGASTRODUODENOSCOPY N/A 12/11/2017   Procedure: ESOPHAGOGASTRODUODENOSCOPY (EGD);  Surgeon: Wyline Mood, MD;  Location: [facility] ENDOSCOPY;  Service: Gastroenterology;  Laterality: N/A;   ESOPHAGOGASTRODUODENOSCOPY N/A 10/08/2021   Procedure: ESOPHAGOGASTRODUODENOSCOPY (EGD);  Surgeon: Regis Bill, MD;  Location: [facility] ENDOSCOPY;  Service: Endoscopy;   Laterality: N/A;   ESOPHAGOGASTRODUODENOSCOPY (EGD) WITH PROPOFOL N/A 09/21/2016   Procedure: ESOPHAGOGASTRODUODENOSCOPY (EGD) WITH PROPOFOL;  Surgeon: Wyline Mood, MD;  Location: [facility] ENDOSCOPY;  Service: Gastroenterology;  Laterality: N/A;   ESOPHAGOGASTRODUODENOSCOPY (EGD) WITH PROPOFOL N/A 11/07/2016   Procedure: ESOPHAGOGASTRODUODENOSCOPY (EGD) WITH PROPOFOL;  Surgeon: Wyline Mood, MD;  Location: [facility] ENDOSCOPY;  Service: Gastroenterology;  Laterality: N/A;   ESOPHAGOGASTRODUODENOSCOPY (EGD) WITH PROPOFOL N/A 10/15/2019   Procedure: ESOPHAGOGASTRODUODENOSCOPY (EGD) WITH PROPOFOL;  Surgeon: Regis Bill, MD;  Location: [facility] ENDOSCOPY;  Service: Endoscopy;  Laterality: N/A;   ESOPHAGOGASTRODUODENOSCOPY (EGD) WITH PROPOFOL N/A 10/28/2019   Procedure: ESOPHAGOGASTRODUODENOSCOPY (EGD) WITH PROPOFOL;  Surgeon: Regis Bill, MD;  Location: [facility] ENDOSCOPY;  Service: Endoscopy;  Laterality: N/A;   ESOPHAGOGASTRODUODENOSCOPY (EGD) WITH PROPOFOL N/A 12/02/2019   Procedure: ESOPHAGOGASTRODUODENOSCOPY (EGD) WITH PROPOFOL;  Surgeon: Regis Bill, MD;  Location: [facility] ENDOSCOPY;  Service: Endoscopy;  Laterality: N/A;   ESOPHAGOGASTRODUODENOSCOPY (EGD) WITH PROPOFOL N/A 02/18/2020   Procedure: ESOPHAGOGASTRODUODENOSCOPY (EGD) WITH PROPOFOL;  Surgeon: Regis Bill, MD;  Location: [facility] ENDOSCOPY;  Service: Endoscopy;  Laterality: N/A;   ESOPHAGOGASTRODUODENOSCOPY (EGD) WITH PROPOFOL N/A 08/02/2021   Procedure: ESOPHAGOGASTRODUODENOSCOPY (EGD) WITH PROPOFOL;  Surgeon: Regis Bill, MD;  Location: [facility] ENDOSCOPY;  Service: Endoscopy;  Laterality: N/A;  REQUEST  AM   ESOPHAGOGASTRODUODENOSCOPY (EGD) WITH PROPOFOL N/A 10/05/2021   Procedure: ESOPHAGOGASTRODUODENOSCOPY (EGD) WITH PROPOFOL;  Surgeon: Regis Bill, MD;  Location: [facility] ENDOSCOPY;  Service: Endoscopy;  Laterality: N/A;   FRACTURE SURGERY Right 05/28/2019   distal radius fracture   HERNIA REPAIR     left inguinal  hernia repair as an infant   KYPHOPLASTY N/A 02/11/2016   Procedure: KYPHOPLASTY  L2,T9;  Surgeon: Kennedy Bucker, MD;  Location: [facility] ORS;  Service: Orthopedics;  Laterality: N/A;   KYPHOPLASTY N/A 03/15/2016   Procedure: KYPHOPLASTY L3;  Surgeon: Kennedy Bucker, MD;  Location: [facility] ORS;  Service: Orthopedics;  Laterality: N/A;   OPEN REDUCTION INTERNAL FIXATION (ORIF) DISTAL RADIAL FRACTURE Right 05/28/2019   Procedure: OPEN REDUCTION INTERNAL FIXATION (ORIF) DISTAL RADIAL FRACTURE;  Surgeon: Kennedy Bucker, MD;  Location: [facility] ORS;  Service: Orthopedics;  Laterality: Right;   skin cancer removed     TONSILLECTOMY       Subjective Assessment -      Subjective  Pt notes he is doing well today. No significant changes since last session.    Pertinent History Charles Stanton is a 66yoM who was prescribed with Parkinson's Disease ~ 10 years.  Pt is followed by neurology, reports good reponse to medications which help control his tremors somewhat. Pt uses a SPC for AMB, also uses a walker for longer distance walking. Pt goes into community almost daily (they like to eat out) which includes lots of SPC [place] eand navigation of 4 stairs at home.    Currently in Pain? No/denies             TREATMENT: Unless otherwise stated, CGA was provided and gait belt donned in order to ensure pt safety   NMR:   Cross body punches- 2 x 10   All ambulation done with metronome placed at 54 bpm for sequencing and gait speed.  -Octane warm-up level 2 x 5 min, level 1 x 1 min  -Seated PWR!: Up, Rock, twist, step 1 x 10.   -Boxing: -Seated cross body punches x 3 alternating hits then pt ducking from therapist swing, done x 10 .  -ambulation to medical [facility] and back x 1100 feet with no rest break, able to keep good sequencing throughout.  -Boxing: -Seated cross body punches x 2 hits one arm then 1 hit with the other before having pt ducking from therapist swing, done x 10.  -Seated PWR! Up to standing up  flow: 2 x 10, cues given on 2nd set to hold position for 3-4 seconds  -Standing PWR!: Standing step, rock, twist x 10 additional stepping onto airex pad, and reach. X 10 each  -Boxing:Seated cross body punches x 3 hits then pt ducking from therapist swing, done 2 x 10 pt reports this exercise as challenging but enjoys the activity.    PWR! Up works on postural strengthening and antigravity extension, PRW! Rock working on [employer] shifting, PRW! Twist targeting trunk rotation and PRW! Step targeting transition movements. PWR! Moves target bradykinesia, rigidity, and dyskinesia through targeted functional movements that address four core movement difficulties for people with Parkinson's disease.      PT Education -     Education Details Pt educated throughout session about proper posture and technique with exercises. Improved exercise technique, movement at target joints, use of target muscles after min to mod verbal, visual, tactile cues.      Person(s) Educated Patient    Methods Explanation;Demonstration ; handout   Comprehension  Verbalized understanding;Returned demonstration;Need further instruction           PT Short Term Goals -       PT SHORT TERM GOAL #1   Title Pt to report regular performance of exercises prescribed for home and a sense of improvements in strength and balance AEB them not being as challenging anymore.    Baseline Will issue on visit 2    Time 4    Period Weeks    Status met   Target Date 03/15/22      PT SHORT TERM GOAL #2   Title Pt to demonstrate improved power AEB improved 5xSTS hands free in <18sec.    Baseline 03/01/22: 22sec hands free, author provideing foot block bilat 03/31/22: 14.82sec 4/23:17.3 sec   Time 4    Period Weeks    Status Met    Target Date 03/29/22               PT Long Term Goals - Target goal date for all remaining long term goals is 07/19/2022        PT LONG TERM GOAL #1   Title Pt to improve score on  FOTO survey to 57 to indicate reduced difficulty with basic mobility required for ADL performance.    Baseline 03/01/22:49 04/29/22:56%. 06/16/22: 60   Time 8    Period Weeks    Status Met          PT LONG TERM GOAL #2   Title Pt to improve tolerance to overground AMB c SPC AEB performance >877ft.    Baseline 2/27: fatigued after 432ft lap performed in four minutes ten seconds. 03/31/22: 79ft with SPC no rest break 04/26/22: 794 ft 06/16/22: 903 ft with SPC, no rest.   Time 8    Period Weeks    Status Met         PT LONG TERM GOAL #3   Title Pt to demonstrate improved leg power AEB 5xSTS from chair <14sec hands free and without need for minGuard assist for safety and without need for foot block.    Baseline 03/01/22: 22sec hands free, feet blocked, minGuard assist  03/31/22: 14.82sec  4/23:17.3 sec 06/16/22: 13.87 sec 7/16: 11.55 sec   Time 8     Period Weeks    Status MET         PT LONG TERM GOAL #4   Title Pt to demonstrate improvement in balance performance AEB >6 improvement BERG Balance for balance assessment.    Baseline 41 on 03/03/22. 3/38/24: 42 4/23: 45 06/16/22: 48   Time 8    Period Weeks    Status Met          5.  Pt will improve distance with LRAD to 900 feet or greater in order to indicate further progress to community ambulation distances Baseline: 794 ft with SPC , 06/16/22: 903 ft with SPC. 07/19/22: 905 feet Goal status: Met  6.  Patient will improve modified DGI (with use of cane) score > 4 points or greater in order to indicate improved balance and decreased risk of falls.  Baseline: 6/18: 11 7/16: 14  Goal status: IN PROGRESS  7. Patient will be able to withstand various light perturbations while preforming ambulation with a walker utilizing safe balance strategies in order to simulate walking in crowded places. ---could walk through cafeteria  Baseline: pt currently does not feel safe preforming task Goal Status: INITIAL  8. Advanced HEP goal (PWR  sets w/o  cues)      Plan     Clinical Impression Statement Patient appeared motivated and ready for treatment on this day. Pt improving with all tasks and functional endurance today, evidenced by no rest break during long distance ambulation bout. Also improving with dynamics movements within PWR! Moves,  showing greater motion and momentum. Pt will continue to benefit from skilled physical therapy intervention to address impairments, improve QOL, and attain therapy goals.     Personal Factors and Comorbidities Time since onset of injury/illness/exacerbation;Past/Current Experience    Examination-Activity Limitations Locomotion Level;Transfers;Bed Mobility;Reach Overhead;Hygiene/Grooming;Dressing;Toileting;Stairs;Bend    Stability/Clinical Decision Making Evolving/Moderate complexity    Clinical Decision Making Moderate    Rehab Potential Good    PT Frequency 2x / week    PT Duration 8 weeks    PT Treatment/Interventions ADLs/Self Care Home Management;Moist Heat;Gait training;Stair training;Functional mobility training;Therapeutic activities;Therapeutic exercise;Balance training;Neuromuscular re-education;Patient/family education    PT Next Visit Plan .  STS PWR! Up with eventual addition of PWR! Step. Work on weight shift cues with STS. Dynamic balance/gait training as indicated.    PT Home Exercise Plan 03/01/22: discussed finding and elevated sit surface for future STS exercises (something ~18-19 inches high)    Consulted and Agree with Plan of Care Patient             Patient will benefit from skilled therapeutic intervention in order to improve the following deficits and impairments:  balance, strength, coordination, gait, transfers. Posture. ROM   Visit Diagnosis: Muscle weakness (generalized)  Other lack of coordination  Difficulty in walking, not elsewhere classified  Other abnormalities of gait and mobility  Unsteadiness on feet    Problem List Patient Active  Problem List   Diagnosis Date Noted   Acute respiratory failure due to COVID-19 (HCC) 05/28/2019   Alcohol dependence with uncomplicated withdrawal (HCC) 01/23/2018   Alcohol withdrawal (HCC) 01/23/2018   GIB (gastrointestinal bleeding) 12/09/2017   A-fib (HCC) 12/09/2017   Esophageal obstruction 10/10/2016   Esophageal ulceration 10/10/2016   GI bleed 09/20/2016   Low vitamin B12 level 03/18/2016   S/P kyphoplasty 03/18/2016   Basal cell carcinoma 02/08/2016   Atrial fibrillation (HCC) 01/27/2016   Colitis 01/27/2016   HLD (hyperlipidemia) 01/27/2016   Anxiety 01/27/2016   Hypokalemia 01/27/2016   Major depressive disorder, recurrent episode, moderate (HCC) 04/14/2015   Anxiety, generalized 12/06/2013   Parkinson disease 08/07/2013   Dysphagia 05/12/2013   Esophageal mass 05/12/2013   GERD (gastroesophageal reflux disease) 05/12/2013   Vomiting 05/12/2013   Open bite of lower leg 05/10/2013   Dog bite of lower leg 05/10/2013   Abnormal finding on liver function 03/25/2013   Benign neoplasm 03/25/2013   Dizziness 03/25/2013   Type 2 diabetes mellitus (HCC) 03/25/2013   BP (high blood pressure) 03/25/2013   Awareness of heartbeats 03/25/2013   Pure hypercholesterolemia 03/25/2013   Infective tonsillitis 03/25/2013   Adenomatous polyp 03/25/2013      Cecile Sheerer, SPT   07/19/22, 5:22 PM

## 2022-07-19 NOTE — Therapy (Addendum)
OUTPATIENT OCCUPATIONAL THERAPY NEURO TREATMENT NOTE  Patient Name: Charles Stanton MRN: [id number] DOB:Oct 06, 1955, 67 y.o., male Today's Date: 07/19/2022  PCP: Dr. Barbette Reichmann REFERRING PROVIDER: Dr. Cristopher Peru  END OF SESSION:  OT End of Session - 07/19/22 1454     Visit Number 12    Number of Visits 24    Date for OT Re-Evaluation 08/18/22    OT Start Time 1400    OT Stop Time 1445    OT Time Calculation (min) 45 min    Equipment Utilized During Treatment Cane    Activity Tolerance Patient tolerated treatment well    Behavior During Therapy [facility] for tasks assessed/performed             Past Medical History:  Diagnosis Date   Abnormal liver function    Anemia    Anxiety    Atrial fibrillation (HCC)    Cancer (HCC) 2017   skin   Colonic mass    Depression    Dizziness    Dry cough    History of colon polyps    Hypercholesteremia    Hyperlipemia    Hypertension    Palpitations    Parkinson's disease    Parkinson's disease    PVD (peripheral vascular disease) (HCC)    Tonsillitis 03/25/2013   Tremors of nervous system    Past Surgical History:  Procedure Laterality Date   BACK SURGERY     Kyphoplasty   March 2018   COLONOSCOPY     COLONOSCOPY WITH PROPOFOL N/A 12/12/2017   Procedure: COLONOSCOPY WITH PROPOFOL;  Surgeon: Wyline Mood, MD;  Location: [facility] ENDOSCOPY;  Service: Gastroenterology;  Laterality: N/A;   ESOPHAGOGASTRODUODENOSCOPY N/A 12/11/2017   Procedure: ESOPHAGOGASTRODUODENOSCOPY (EGD);  Surgeon: Wyline Mood, MD;  Location: [facility] ENDOSCOPY;  Service: Gastroenterology;  Laterality: N/A;   ESOPHAGOGASTRODUODENOSCOPY N/A 10/08/2021   Procedure: ESOPHAGOGASTRODUODENOSCOPY (EGD);  Surgeon: Regis Bill, MD;  Location: [facility] ENDOSCOPY;  Service: Endoscopy;  Laterality: N/A;   ESOPHAGOGASTRODUODENOSCOPY (EGD) WITH PROPOFOL N/A 09/21/2016   Procedure: ESOPHAGOGASTRODUODENOSCOPY (EGD) WITH PROPOFOL;  Surgeon: Wyline Mood, MD;  Location: [facility]  ENDOSCOPY;  Service: Gastroenterology;  Laterality: N/A;   ESOPHAGOGASTRODUODENOSCOPY (EGD) WITH PROPOFOL N/A 11/07/2016   Procedure: ESOPHAGOGASTRODUODENOSCOPY (EGD) WITH PROPOFOL;  Surgeon: Wyline Mood, MD;  Location: [facility] ENDOSCOPY;  Service: Gastroenterology;  Laterality: N/A;   ESOPHAGOGASTRODUODENOSCOPY (EGD) WITH PROPOFOL N/A 10/15/2019   Procedure: ESOPHAGOGASTRODUODENOSCOPY (EGD) WITH PROPOFOL;  Surgeon: Regis Bill, MD;  Location: [facility] ENDOSCOPY;  Service: Endoscopy;  Laterality: N/A;   ESOPHAGOGASTRODUODENOSCOPY (EGD) WITH PROPOFOL N/A 10/28/2019   Procedure: ESOPHAGOGASTRODUODENOSCOPY (EGD) WITH PROPOFOL;  Surgeon: Regis Bill, MD;  Location: [facility] ENDOSCOPY;  Service: Endoscopy;  Laterality: N/A;   ESOPHAGOGASTRODUODENOSCOPY (EGD) WITH PROPOFOL N/A 12/02/2019   Procedure: ESOPHAGOGASTRODUODENOSCOPY (EGD) WITH PROPOFOL;  Surgeon: Regis Bill, MD;  Location: [facility] ENDOSCOPY;  Service: Endoscopy;  Laterality: N/A;   ESOPHAGOGASTRODUODENOSCOPY (EGD) WITH PROPOFOL N/A 02/18/2020   Procedure: ESOPHAGOGASTRODUODENOSCOPY (EGD) WITH PROPOFOL;  Surgeon: Regis Bill, MD;  Location: [facility] ENDOSCOPY;  Service: Endoscopy;  Laterality: N/A;   ESOPHAGOGASTRODUODENOSCOPY (EGD) WITH PROPOFOL N/A 08/02/2021   Procedure: ESOPHAGOGASTRODUODENOSCOPY (EGD) WITH PROPOFOL;  Surgeon: Regis Bill, MD;  Location: [facility] ENDOSCOPY;  Service: Endoscopy;  Laterality: N/A;  REQUEST AM   ESOPHAGOGASTRODUODENOSCOPY (EGD) WITH PROPOFOL N/A 10/05/2021   Procedure: ESOPHAGOGASTRODUODENOSCOPY (EGD) WITH PROPOFOL;  Surgeon: Regis Bill, MD;  Location: [facility] ENDOSCOPY;  Service: Endoscopy;  Laterality: N/A;   FRACTURE SURGERY Right 05/28/2019   distal radius fracture  HERNIA REPAIR     left inguinal hernia repair as an infant   KYPHOPLASTY N/A 02/11/2016   Procedure: KYPHOPLASTY  L2,T9;  Surgeon: Kennedy Bucker, MD;  Location: [facility] ORS;  Service: Orthopedics;  Laterality: N/A;    KYPHOPLASTY N/A 03/15/2016   Procedure: KYPHOPLASTY L3;  Surgeon: Kennedy Bucker, MD;  Location: [facility] ORS;  Service: Orthopedics;  Laterality: N/A;   OPEN REDUCTION INTERNAL FIXATION (ORIF) DISTAL RADIAL FRACTURE Right 05/28/2019   Procedure: OPEN REDUCTION INTERNAL FIXATION (ORIF) DISTAL RADIAL FRACTURE;  Surgeon: Kennedy Bucker, MD;  Location: [facility] ORS;  Service: Orthopedics;  Laterality: Right;   skin cancer removed     TONSILLECTOMY     Patient Active Problem List   Diagnosis Date Noted   Acute respiratory failure due to COVID-19 (HCC) 05/28/2019   Alcohol dependence with uncomplicated withdrawal (HCC) 01/23/2018   Alcohol withdrawal (HCC) 01/23/2018   GIB (gastrointestinal bleeding) 12/09/2017   A-fib (HCC) 12/09/2017   Esophageal obstruction 10/10/2016   Esophageal ulceration 10/10/2016   GI bleed 09/20/2016   Low vitamin B12 level 03/18/2016   S/P kyphoplasty 03/18/2016   Basal cell carcinoma 02/08/2016   Atrial fibrillation (HCC) 01/27/2016   Colitis 01/27/2016   HLD (hyperlipidemia) 01/27/2016   Anxiety 01/27/2016   Hypokalemia 01/27/2016   Major depressive disorder, recurrent episode, moderate (HCC) 04/14/2015   Anxiety, generalized 12/06/2013   Parkinson disease 08/07/2013   Dysphagia 05/12/2013   Esophageal mass 05/12/2013   GERD (gastroesophageal reflux disease) 05/12/2013   Vomiting 05/12/2013   Open bite of lower leg 05/10/2013   Dog bite of lower leg 05/10/2013   Abnormal finding on liver function 03/25/2013   Benign neoplasm 03/25/2013   Dizziness 03/25/2013   Type 2 diabetes mellitus (HCC) 03/25/2013   BP (high blood pressure) 03/25/2013   Awareness of heartbeats 03/25/2013   Pure hypercholesterolemia 03/25/2013   Infective tonsillitis 03/25/2013   Adenomatous polyp 03/25/2013   ONSET DATE: 10 years   REFERRING DIAG: Parkinson's Disease  THERAPY DIAG:  Muscle weakness (generalized)  Other lack of coordination  Rationale for Evaluation and Treatment:  Rehabilitation  SUBJECTIVE:  SUBJECTIVE STATEMENT: Pt reports he is doing well today. Pt. Reports that he desires to be able to open up his own soda bottle instead of his wife having to do it for him. Pt accompanied by: self; spouse present in lobby, but did not follow for eval  PERTINENT HISTORY: Pt has been working with outpatient PT here at [facility] since end of Feb 2024.  OT ordered d/t worsening PD symptoms contributing to functional decline with ADLs.   PRECAUTIONS: Fall  WEIGHT BEARING RESTRICTIONS: No  PAIN:  Are you having pain? No  FALLS: Has patient fallen in last 6 months? Yes. Number of falls 1  LIVING ENVIRONMENT: Lives with: lives with their spouse Lives in: split level but pt remains on main level to avoid steps Stairs: Yes: Internal: 2 steps; can reach both Has following equipment at home: Quad cane small base, Walker - 4 wheeled, bed side commode, Grab bars, and pull down seat for shower, hand held shower hose,   PLOF: Needs assistance with ADLs; pt reports that spouse assists but is consistent to always let pt try things first before she automatically helps.    PATIENT GOALS: To be more indep  OBJECTIVE:  HAND DOMINANCE: Right (most affected side from PD)  ADLs: Overall ADLs: spouse is primary caregiver Transfers/ambulation related to ADLs: uses RW Eating: spouse cuts food; pt reports having to use L  non-dominant hand to eat for the last 5-6 yrs Grooming: set up; assist to squeeze toothpaste and denture paste; uses L non-dominant hand UB Dressing: set up; assist with clothing fasteners LB Dressing: Min A; assist with socks, dons slip on shoes with set up (has tried a sockaid but was unsuccessful) Toileting: modified indep Bathing: Set up/min A; spouse squeezes shampoo into pt's hands, spouse helps to towel dry  Tub Shower transfers: distant supv Equipment:  see above   IADLs: Shopping: pt can accompany spouse by pushing a shopping cart.  Light housekeeping:  pt can drag trash to bin and can roll the bin to the road and back Meal Prep: spouse manages  Community mobility: spouse drives; uses rollator for longer distances  Medication management: spouse manages pills using weekly pill organizer.   Financial management: spouse manages  Handwriting: Mild micrographia and <25% legible  MOBILITY STATUS: Hx of falls  POSTURE COMMENTS:  rounded shoulders and forward head, R shoulder elevated  ACTIVITY TOLERANCE: Activity tolerance: TBD and assessed further with functional activities   FUNCTIONAL OUTCOME MEASURES: FOTO: TBD   UPPER EXTREMITY ROM:    Active ROM Right eval Left eval Right 07/12/2022 Left 07/12/2022  Shoulder flexion 118 85 120 117  Shoulder abduction 128 124 132 127  Shoulder adduction      Shoulder extension      Shoulder internal rotation [facility] WFL  Shoulder external rotation [facility] WFL  Elbow flexion      Elbow extension      Wrist flexion 55 76 59 75  Wrist extension 57 75 61 71  Wrist ulnar deviation      Wrist radial deviation      Wrist pronation [facility] WFL WFL  Wrist supination 63 75 66 81  (Blank rows = not tested)  UPPER EXTREMITY MMT:     MMT Right eval Left eval Right 07/12/2022 Left 07/12/2022  Shoulder flexion 4 4 4+ 4+  Shoulder abduction 4+ 4+ 4+ 4+  Shoulder adduction      Shoulder extension      Shoulder internal rotation      Shoulder external rotation      Middle trapezius      Lower trapezius      Elbow flexion 4+ 4+ 4+ 4+  Elbow extension 4+ 4+ 4+ 4+  Wrist flexion 4+ 4+ 4+ 4+  Wrist extension 4+ 4+ 4+ 4+  Wrist ulnar deviation      Wrist radial deviation      Wrist pronation      Wrist supination      (Blank rows = not tested)  HAND FUNCTION: Grip strength: Right: 16 lbs; Left: 38 lbs, Lateral pinch: Right: 10 lbs, Left: 14 lbs, and 3 point pinch: Right: 9 lbs, Left: 11 lbs 07/12/2022: Grip strength: Right: 16 lbs; Left: 40 lbs, Lateral pinch: Right: 10 lbs, Left: 14 lbs,  and 3 point pinch: Right: 9 lbs, Left: 15 lbs COORDINATION: 9 Hole Peg test: Right: 38 sec; Left: 40 sec 07/12/2022: 9 Hole Peg test: Right: 35 sec; Left: 36 sec SENSATION: WFL  EDEMA: None  MUSCLE TONE: R/L normal  COGNITION: Overall cognitive status: Within functional limits for tasks assessed  VISION: Subjective report: wears glasses all the time    PRAXIS: Impaired: Motor planning  OBSERVATIONS:  R SF PIP flexion contracture 105* from 5-6 years ago from a fall  TODAY'S TREATMENT:  DATE: 07/19/22:  Therapeutic Exercise:  Pt. worked on [employer] with use of a hand gripper set at 6.6# on the R x 2 trials and 17.9# on the left  x's 2 trials using the hand grip strengthener to remove jumbo pegs from pegboard. Intermittent min vc and tactile cues for set up of hand gripper in hand to maximize closure with each gripping rep. Pt. Worked on using lateral pinch to place yellow, red, green, blue, and black onto horizontal dowel with the L hand x 1 trial. Pt. Worked on using lateral pinch with the R hand to place yellow, red, green and blue resistive clips onto horizontal dowel x 1 trial.  Pt. worked on grasping 1" resistive cubes alternating thumb opposition to the tip of the 2nd and 3rd digits while the board is placed at a vertical angle. Pt. worked on pressing the cubes back into place while alternating isolated 2nd through 5th digit extension.   PATIENT EDUCATION: Education details: Potential benefit of wrist weight to minimize UE tremor Person educated: Patient Education method: Explanation and Verbal cues, demo Education comprehension: verbalized understanding, demonstrated understanding  HOME EXERCISE PROGRAM: Theraputty   GOALS: Goals reviewed with patient? Yes  SHORT TERM GOALS: Target date: 07/07/22 (6 weeks)  Pt will be indep to perform HEP  for improving bilat hand strength and coordination skills. Baseline: Eval: not yet initiated 07/12/2022: Completing theraputty exercises independent at home Goal status: Met/Achieved  LONG TERM GOALS: Target date: 08/18/22 (12 weeks)  Pt will increase FOTO score to (TBD) or better to indicate improvement in self perceived functional use of the R arm with daily tasks. Baseline: Eval: TBD 07/12/2022: TBD Goal status: INITIAL  2.  Pt will increase R grip strength by 5 or more lbs to more easily hold and stabilize ADL supplies in the R dominant hand. Baseline: R grip 16 lbs; limited from PD but also by 5th digit PIP flexion contracture, as this digit can not engage in gripping (L 38 lbs) 07/12/2022: Grip strength: Right: 16 lbs; limited from PD but also by 5th digit PIP flexion contracture, as this digit still can not engage in gripping (L 40 lbs) Goal status: Ongoing  3.  Pt will improve R hand FMC/dexterity skills to be able to sign his name on medical/legal documents with 75% legibility, using adapted writing aids as needed. Baseline: Eval: <25% legible and pt verbalizes embarrassment when attempting to sign his name on forms 07/12/2022: 75% legibility when writing his name in print, however reports he is still not satisfied with his signature looks. Goal status: Ongoing  4.  Pt will increase R FMC/GMC skills to engage the RUE into UB ADLs at least 50% of the time. Baseline: Eval: Pt uses the L non-dominant arm for self care tasks. 07/12/22: Pt. Reports that he has been trying to incorporate his R hand while still using the L arm to complete all self care tasks.  Goal status: Ongoing  ASSESSMENT:  CLINICAL IMPRESSION:  Pt. Reports that he is frustrated that he is unable to open up his soda bottle and desires to work on lateral pinch strength. Pt. Tolerated 6.6# on the R x 2 trials and 17.9# on the left  x's 2 trials using the hand grip strengthener to remove jumbo pegs from pegboard. Pt. Was able  to use lateral pinch on the R and L to place resistive clips onto horizontal dowel however was unable to use black resistance for the R hand. Pt. Was able  to grasp resistive cubes however required increased verbal cues to ensure thumb opposition throughout.  Pt. continues to present with decreased  grip on the R d/t R Small Finger flexion contracture preventing this digit from engaging in gripping on this hand. Pt. continues to benefit from skilled OT for increasing bilateral hand strength, improve coordination, specifically working to increase engagement of the RUE during self care tasks.    PERFORMANCE DEFICITS: in functional skills including ADLs, IADLs, coordination, dexterity, ROM, strength, flexibility, Fine motor control, Gross motor control, mobility, balance, body mechanics, endurance, decreased knowledge of use of DME, and UE functional use, cognitive skills including emotional and memory, and psychosocial skills including coping strategies, environmental adaptation, habits, and routines and behaviors.   IMPAIRMENTS: are limiting patient from ADLs, IADLs, and leisure.   CO-MORBIDITIES: has co-morbidities such as anxiety, depression  that affects occupational performance. Patient will benefit from skilled OT to address above impairments and improve overall function.  MODIFICATION OR ASSISTANCE TO COMPLETE EVALUATION: No modification of tasks or assist necessary to complete an evaluation.  OT OCCUPATIONAL PROFILE AND HISTORY: Problem focused assessment: Including review of records relating to presenting problem.  CLINICAL DECISION MAKING: Moderate - several treatment options, min-mod task modification necessary  REHAB POTENTIAL: Good for goals  EVALUATION COMPLEXITY: Moderate    PLAN:  OT FREQUENCY: 2x/week  OT DURATION: 12 weeks  PLANNED INTERVENTIONS: self care/ADL training, therapeutic exercise, therapeutic activity, neuromuscular re-education, manual therapy, passive range of  motion, balance training, functional mobility training, moist heat, cryotherapy, patient/family education, cognitive remediation/compensation, psychosocial skills training, energy conservation, coping strategies training, and DME and/or AE instructions  RECOMMENDED OTHER SERVICES: None at this time  CONSULTED AND AGREED WITH PLAN OF CARE: Patient  PLAN FOR NEXT SESSION: initiate HEP, complete Sudie Grumbling, OTS 07/19/2022 2:55 PM  This entire session was performed under the direct supervision and direction of a licensed therapist. I have personally read, edited, and approve of the note as written.   Olegario Messier, MS, OTR/L   07/19/2022

## 2022-07-20 ENCOUNTER — Encounter: Payer: Self-pay | Admitting: Physical Therapy

## 2022-07-21 ENCOUNTER — Ambulatory Visit: Payer: Medicare Other | Admitting: Physical Therapy

## 2022-07-21 ENCOUNTER — Encounter: Payer: Self-pay | Admitting: Physical Therapy

## 2022-07-21 ENCOUNTER — Ambulatory Visit: Payer: Medicare Other | Admitting: Occupational Therapy

## 2022-07-21 DIAGNOSIS — R262 Difficulty in walking, not elsewhere classified: Secondary | ICD-10-CM | POA: Diagnosis not present

## 2022-07-21 DIAGNOSIS — R278 Other lack of coordination: Secondary | ICD-10-CM

## 2022-07-21 DIAGNOSIS — M6281 Muscle weakness (generalized): Secondary | ICD-10-CM

## 2022-07-21 DIAGNOSIS — R2681 Unsteadiness on feet: Secondary | ICD-10-CM

## 2022-07-21 DIAGNOSIS — R2689 Other abnormalities of gait and mobility: Secondary | ICD-10-CM

## 2022-07-21 NOTE — Therapy (Signed)
Outpatient Physical Therapy Treatment    Patient Details  Name: Charles Stanton MRN: [id number] Date of Birth: 1955/04/23 Referring Provider (PT): Dr. Cristopher Peru   Encounter Date: 07/21/2022  END OF SESSION:    PT End of Session - 07/21/22 1335     Visit Number 39    Number of Visits 62    Date for PT Re-Evaluation 10/11/22    Authorization Type Medicare A & B primary; federal employee secondary    Authorization Time Period -10/11/22    Progress Note Due on Visit 40    PT Start Time 1315    PT Stop Time 1400    PT Time Calculation (min) 45 min    Equipment Utilized During Treatment Gait belt    Activity Tolerance Patient tolerated treatment well;No increased pain    Behavior During Therapy [facility] for tasks assessed/performed                     Past Medical History:  Diagnosis Date   Abnormal liver function    Anemia    Anxiety    Atrial fibrillation (HCC)    Cancer (HCC) 2017   skin   Colonic mass    Depression    Dizziness    Dry cough    History of colon polyps    Hypercholesteremia    Hyperlipemia    Hypertension    Palpitations    Parkinson's disease    Parkinson's disease    PVD (peripheral vascular disease) (HCC)    Tonsillitis 03/25/2013   Tremors of nervous system     Past Surgical History:  Procedure Laterality Date   BACK SURGERY     Kyphoplasty   March 2018   COLONOSCOPY     COLONOSCOPY WITH PROPOFOL N/A 12/12/2017   Procedure: COLONOSCOPY WITH PROPOFOL;  Surgeon: Wyline Mood, MD;  Location: [facility] ENDOSCOPY;  Service: Gastroenterology;  Laterality: N/A;   ESOPHAGOGASTRODUODENOSCOPY N/A 12/11/2017   Procedure: ESOPHAGOGASTRODUODENOSCOPY (EGD);  Surgeon: Wyline Mood, MD;  Location: [facility] ENDOSCOPY;  Service: Gastroenterology;  Laterality: N/A;   ESOPHAGOGASTRODUODENOSCOPY N/A 10/08/2021   Procedure: ESOPHAGOGASTRODUODENOSCOPY (EGD);  Surgeon: Regis Bill, MD;  Location: [facility] ENDOSCOPY;  Service: Endoscopy;  Laterality: N/A;    ESOPHAGOGASTRODUODENOSCOPY (EGD) WITH PROPOFOL N/A 09/21/2016   Procedure: ESOPHAGOGASTRODUODENOSCOPY (EGD) WITH PROPOFOL;  Surgeon: Wyline Mood, MD;  Location: [facility] ENDOSCOPY;  Service: Gastroenterology;  Laterality: N/A;   ESOPHAGOGASTRODUODENOSCOPY (EGD) WITH PROPOFOL N/A 11/07/2016   Procedure: ESOPHAGOGASTRODUODENOSCOPY (EGD) WITH PROPOFOL;  Surgeon: Wyline Mood, MD;  Location: [facility] ENDOSCOPY;  Service: Gastroenterology;  Laterality: N/A;   ESOPHAGOGASTRODUODENOSCOPY (EGD) WITH PROPOFOL N/A 10/15/2019   Procedure: ESOPHAGOGASTRODUODENOSCOPY (EGD) WITH PROPOFOL;  Surgeon: Regis Bill, MD;  Location: [facility] ENDOSCOPY;  Service: Endoscopy;  Laterality: N/A;   ESOPHAGOGASTRODUODENOSCOPY (EGD) WITH PROPOFOL N/A 10/28/2019   Procedure: ESOPHAGOGASTRODUODENOSCOPY (EGD) WITH PROPOFOL;  Surgeon: Regis Bill, MD;  Location: [facility] ENDOSCOPY;  Service: Endoscopy;  Laterality: N/A;   ESOPHAGOGASTRODUODENOSCOPY (EGD) WITH PROPOFOL N/A 12/02/2019   Procedure: ESOPHAGOGASTRODUODENOSCOPY (EGD) WITH PROPOFOL;  Surgeon: Regis Bill, MD;  Location: [facility] ENDOSCOPY;  Service: Endoscopy;  Laterality: N/A;   ESOPHAGOGASTRODUODENOSCOPY (EGD) WITH PROPOFOL N/A 02/18/2020   Procedure: ESOPHAGOGASTRODUODENOSCOPY (EGD) WITH PROPOFOL;  Surgeon: Regis Bill, MD;  Location: [facility] ENDOSCOPY;  Service: Endoscopy;  Laterality: N/A;   ESOPHAGOGASTRODUODENOSCOPY (EGD) WITH PROPOFOL N/A 08/02/2021   Procedure: ESOPHAGOGASTRODUODENOSCOPY (EGD) WITH PROPOFOL;  Surgeon: Regis Bill, MD;  Location: [facility] ENDOSCOPY;  Service: Endoscopy;  Laterality: N/A;  REQUEST AM  ESOPHAGOGASTRODUODENOSCOPY (EGD) WITH PROPOFOL N/A 10/05/2021   Procedure: ESOPHAGOGASTRODUODENOSCOPY (EGD) WITH PROPOFOL;  Surgeon: Regis Bill, MD;  Location: [facility] ENDOSCOPY;  Service: Endoscopy;  Laterality: N/A;   FRACTURE SURGERY Right 05/28/2019   distal radius fracture   HERNIA REPAIR     left inguinal hernia repair as an  infant   KYPHOPLASTY N/A 02/11/2016   Procedure: KYPHOPLASTY  L2,T9;  Surgeon: Kennedy Bucker, MD;  Location: [facility] ORS;  Service: Orthopedics;  Laterality: N/A;   KYPHOPLASTY N/A 03/15/2016   Procedure: KYPHOPLASTY L3;  Surgeon: Kennedy Bucker, MD;  Location: [facility] ORS;  Service: Orthopedics;  Laterality: N/A;   OPEN REDUCTION INTERNAL FIXATION (ORIF) DISTAL RADIAL FRACTURE Right 05/28/2019   Procedure: OPEN REDUCTION INTERNAL FIXATION (ORIF) DISTAL RADIAL FRACTURE;  Surgeon: Kennedy Bucker, MD;  Location: [facility] ORS;  Service: Orthopedics;  Laterality: Right;   skin cancer removed     TONSILLECTOMY       Subjective Assessment -      Subjective  Pt notes he is doing well today. No significant changes since last session.    Pertinent History Charles Stanton is a 66yoM who was prescribed with Parkinson's Disease ~ 10 years.  Pt is followed by neurology, reports good reponse to medications which help control his tremors somewhat. Pt uses a SPC for AMB, also uses a walker for longer distance walking. Pt goes into community almost daily (they like to eat out) which includes lots of SPC [place] eand navigation of 4 stairs at home.    Currently in Pain? No/denies             TREATMENT: Unless otherwise stated, CGA was provided and gait belt donned in order to ensure pt safety   2 minute warm up at level 2 on [employer] fitness machine.  NMR: -PWR! Seated move set x 2 of: Rock, Up, Twist, and Step, attempted first set with minimal cueing pt unable to recall seated PWR exercises without assistance.  -PWR! Up works on postural strengthening and antigravity extension, PRW! Rock working on [employer] shifting, PRW! Twist targeting trunk rotation and PRW! Step targeting transition movements. PWR! Moves target bradykinesia, rigidity, and dyskinesia through targeted functional movements that address four core movement difficulties for people with Parkinson's disease.   -Indoor/Outdoor ambulation x 1300 feet from  PT gym to outside, entering back into the building via [facility]. Working with metronome at 54 bpm to achieve proper sequencing for long durations of walking. -no rest breaks this time at increased distance compared to last session  -Standing lunge onto [employer] pad 3 x 12, ea side with cross body punches, PT guarding pt while SPT holding punching pads. Pt improved with coordination from initial attempt last session.        PT Education -     Education Details Pt educated throughout session about proper posture and technique with exercises. Improved exercise technique, movement at target joints, use of target muscles after min to mod verbal, visual, tactile cues.      Person(s) Educated Patient    Methods Explanation;Demonstration ; handout   Comprehension Verbalized understanding;Returned demonstration;Need further instruction           PT Short Term Goals -       PT SHORT TERM GOAL #1   Title Pt to report regular performance of exercises prescribed for home and a sense of improvements in strength and balance AEB them not being as challenging anymore.    Baseline Will issue on visit  2    Time 4    Period Weeks    Status met   Target Date 03/15/22      PT SHORT TERM GOAL #2   Title Pt to demonstrate improved power AEB improved 5xSTS hands free in <18sec.    Baseline 03/01/22: 22sec hands free, author provideing foot block bilat 03/31/22: 14.82sec 4/23:17.3 sec   Time 4    Period Weeks    Status Met    Target Date 03/29/22               PT Long Term Goals - Target goal date for all remaining long term goals is 07/19/2022        PT LONG TERM GOAL #1   Title Pt to improve score on FOTO survey to 57 to indicate reduced difficulty with basic mobility required for ADL performance.    Baseline 03/01/22:49 04/29/22:56%. 06/16/22: 60   Time 8    Period Weeks    Status Met          PT LONG TERM GOAL #2   Title Pt to improve tolerance to overground AMB c SPC  AEB performance >863ft.    Baseline 2/27: fatigued after 493ft lap performed in four minutes ten seconds. 03/31/22: 741ft with SPC no rest break 04/26/22: 794 ft 06/16/22: 903 ft with SPC, no rest.   Time 8    Period Weeks    Status Met         PT LONG TERM GOAL #3   Title Pt to demonstrate improved leg power AEB 5xSTS from chair <14sec hands free and without need for minGuard assist for safety and without need for foot block.    Baseline 03/01/22: 22sec hands free, feet blocked, minGuard assist  03/31/22: 14.82sec  4/23:17.3 sec 06/16/22: 13.87 sec 7/16: 11.55 sec   Time 8     Period Weeks    Status MET         PT LONG TERM GOAL #4   Title Pt to demonstrate improvement in balance performance AEB >6 improvement BERG Balance for balance assessment.    Baseline 41 on 03/03/22. 3/38/24: 42 4/23: 45 06/16/22: 48   Time 8    Period Weeks    Status Met          5.  Pt will improve distance with LRAD to 900 feet or greater in order to indicate further progress to community ambulation distances Baseline: 794 ft with SPC , 06/16/22: 903 ft with SPC. 07/19/22: 905 feet Goal status: Met  6.  Patient will improve modified DGI (with use of cane) score > 4 points or greater in order to indicate improved balance and decreased risk of falls.  Baseline: 6/18: 11 7/16: 14  Goal status: IN PROGRESS  7. Patient will be able to withstand various light perturbations while preforming ambulation with LRAD utilizing safe balance strategies in order to simulate safe and confident walking in crowded places. Baseline: Pt currently does not feel safe preforming task, could walk through cafeteria to get baseline. Goal Status: INITIAL  8. Pt will be compliant and independent with Parkinson's based HEP by reciting and demonstrating full PWR! Move sets in seated and or standing with no cueing from therapist. Baseline: Pt currently needs demonstration or verbal cueing for proper completion  Goal Status:  INITIAL     Plan     Clinical Impression Statement Patient appeared motivated and ready for treatment on this day. Continues to work on long  distance ambulation with metronome sequencing at increased distances. Progressing with standing boxing activities as well with good results; doing so for increased balance, trunk rotation, and dynamic strength. Discussed new goals with pt, planning to assess baseline if not done already, and progress with future interventions. Pt will continue to benefit from skilled physical therapy intervention to address impairments, improve QOL, and attain therapy goals.     Personal Factors and Comorbidities Time since onset of injury/illness/exacerbation;Past/Current Experience    Examination-Activity Limitations Locomotion Level;Transfers;Bed Mobility;Reach Overhead;Hygiene/Grooming;Dressing;Toileting;Stairs;Bend    Stability/Clinical Decision Making Evolving/Moderate complexity    Clinical Decision Making Moderate    Rehab Potential Good    PT Frequency 2x / week    PT Duration 8 weeks    PT Treatment/Interventions ADLs/Self Care Home Management;Moist Heat;Gait training;Stair training;Functional mobility training;Therapeutic activities;Therapeutic exercise;Balance training;Neuromuscular re-education;Patient/family education    PT Next Visit Plan .  STS PWR! Up with eventual addition of PWR! Step. Work on weight shift cues with STS. Dynamic balance/gait training as indicated.    PT Home Exercise Plan 03/01/22: discussed finding and elevated sit surface for future STS exercises (something ~18-19 inches high)    Consulted and Agree with Plan of Care Patient             Patient will benefit from skilled therapeutic intervention in order to improve the following deficits and impairments:  balance, strength, coordination, gait, transfers. Posture. ROM   Visit Diagnosis: Muscle weakness (generalized)  Other lack of coordination  Difficulty in walking, not  elsewhere classified  Other abnormalities of gait and mobility  Unsteadiness on feet    Problem List Patient Active Problem List   Diagnosis Date Noted   Acute respiratory failure due to COVID-19 (HCC) 05/28/2019   Alcohol dependence with uncomplicated withdrawal (HCC) 01/23/2018   Alcohol withdrawal (HCC) 01/23/2018   GIB (gastrointestinal bleeding) 12/09/2017   A-fib (HCC) 12/09/2017   Esophageal obstruction 10/10/2016   Esophageal ulceration 10/10/2016   GI bleed 09/20/2016   Low vitamin B12 level 03/18/2016   S/P kyphoplasty 03/18/2016   Basal cell carcinoma 02/08/2016   Atrial fibrillation (HCC) 01/27/2016   Colitis 01/27/2016   HLD (hyperlipidemia) 01/27/2016   Anxiety 01/27/2016   Hypokalemia 01/27/2016   Major depressive disorder, recurrent episode, moderate (HCC) 04/14/2015   Anxiety, generalized 12/06/2013   Parkinson disease 08/07/2013   Dysphagia 05/12/2013   Esophageal mass 05/12/2013   GERD (gastroesophageal reflux disease) 05/12/2013   Vomiting 05/12/2013   Open bite of lower leg 05/10/2013   Dog bite of lower leg 05/10/2013   Abnormal finding on liver function 03/25/2013   Benign neoplasm 03/25/2013   Dizziness 03/25/2013   Type 2 diabetes mellitus (HCC) 03/25/2013   BP (high blood pressure) 03/25/2013   Awareness of heartbeats 03/25/2013   Pure hypercholesterolemia 03/25/2013   Infective tonsillitis 03/25/2013   Adenomatous polyp 03/25/2013      Cecile Sheerer, SPT 07/21/22, 2:20 PM  Norman Herrlich PT ,DPT Physical Therapist- [place]  [facility]

## 2022-07-21 NOTE — Therapy (Addendum)
OUTPATIENT OCCUPATIONAL THERAPY NEURO TREATMENT NOTE  Patient Name: Charles Stanton MRN: [id number] DOB:Sep 18, 1955, 67 y.o., male Today's Date: 07/21/2022  PCP: Dr. Barbette Reichmann REFERRING PROVIDER: Dr. Cristopher Peru  END OF SESSION:  OT End of Session - 07/21/22 1404     Visit Number 13 (P)     Number of Visits 24 (P)     Date for OT Re-Evaluation 08/18/22 (P)     OT Start Time 1400 (P)     Equipment Utilized During Treatment Cane (P)     Activity Tolerance Patient tolerated treatment well (P)     Behavior During Therapy WFL for tasks assessed/performed (P)              Past Medical History:  Diagnosis Date   Abnormal liver function    Anemia    Anxiety    Atrial fibrillation (HCC)    Cancer (HCC) 2017   skin   Colonic mass    Depression    Dizziness    Dry cough    History of colon polyps    Hypercholesteremia    Hyperlipemia    Hypertension    Palpitations    Parkinson's disease    Parkinson's disease    PVD (peripheral vascular disease) (HCC)    Tonsillitis 03/25/2013   Tremors of nervous system    Past Surgical History:  Procedure Laterality Date   BACK SURGERY     Kyphoplasty   March 2018   COLONOSCOPY     COLONOSCOPY WITH PROPOFOL N/A 12/12/2017   Procedure: COLONOSCOPY WITH PROPOFOL;  Surgeon: Wyline Mood, MD;  Location: [facility] ENDOSCOPY;  Service: Gastroenterology;  Laterality: N/A;   ESOPHAGOGASTRODUODENOSCOPY N/A 12/11/2017   Procedure: ESOPHAGOGASTRODUODENOSCOPY (EGD);  Surgeon: Wyline Mood, MD;  Location: [facility] ENDOSCOPY;  Service: Gastroenterology;  Laterality: N/A;   ESOPHAGOGASTRODUODENOSCOPY N/A 10/08/2021   Procedure: ESOPHAGOGASTRODUODENOSCOPY (EGD);  Surgeon: Regis Bill, MD;  Location: [facility] ENDOSCOPY;  Service: Endoscopy;  Laterality: N/A;   ESOPHAGOGASTRODUODENOSCOPY (EGD) WITH PROPOFOL N/A 09/21/2016   Procedure: ESOPHAGOGASTRODUODENOSCOPY (EGD) WITH PROPOFOL;  Surgeon: Wyline Mood, MD;  Location: [facility] ENDOSCOPY;  Service:  Gastroenterology;  Laterality: N/A;   ESOPHAGOGASTRODUODENOSCOPY (EGD) WITH PROPOFOL N/A 11/07/2016   Procedure: ESOPHAGOGASTRODUODENOSCOPY (EGD) WITH PROPOFOL;  Surgeon: Wyline Mood, MD;  Location: [facility] ENDOSCOPY;  Service: Gastroenterology;  Laterality: N/A;   ESOPHAGOGASTRODUODENOSCOPY (EGD) WITH PROPOFOL N/A 10/15/2019   Procedure: ESOPHAGOGASTRODUODENOSCOPY (EGD) WITH PROPOFOL;  Surgeon: Regis Bill, MD;  Location: [facility] ENDOSCOPY;  Service: Endoscopy;  Laterality: N/A;   ESOPHAGOGASTRODUODENOSCOPY (EGD) WITH PROPOFOL N/A 10/28/2019   Procedure: ESOPHAGOGASTRODUODENOSCOPY (EGD) WITH PROPOFOL;  Surgeon: Regis Bill, MD;  Location: [facility] ENDOSCOPY;  Service: Endoscopy;  Laterality: N/A;   ESOPHAGOGASTRODUODENOSCOPY (EGD) WITH PROPOFOL N/A 12/02/2019   Procedure: ESOPHAGOGASTRODUODENOSCOPY (EGD) WITH PROPOFOL;  Surgeon: Regis Bill, MD;  Location: [facility] ENDOSCOPY;  Service: Endoscopy;  Laterality: N/A;   ESOPHAGOGASTRODUODENOSCOPY (EGD) WITH PROPOFOL N/A 02/18/2020   Procedure: ESOPHAGOGASTRODUODENOSCOPY (EGD) WITH PROPOFOL;  Surgeon: Regis Bill, MD;  Location: [facility] ENDOSCOPY;  Service: Endoscopy;  Laterality: N/A;   ESOPHAGOGASTRODUODENOSCOPY (EGD) WITH PROPOFOL N/A 08/02/2021   Procedure: ESOPHAGOGASTRODUODENOSCOPY (EGD) WITH PROPOFOL;  Surgeon: Regis Bill, MD;  Location: [facility] ENDOSCOPY;  Service: Endoscopy;  Laterality: N/A;  REQUEST AM   ESOPHAGOGASTRODUODENOSCOPY (EGD) WITH PROPOFOL N/A 10/05/2021   Procedure: ESOPHAGOGASTRODUODENOSCOPY (EGD) WITH PROPOFOL;  Surgeon: Regis Bill, MD;  Location: [facility] ENDOSCOPY;  Service: Endoscopy;  Laterality: N/A;   FRACTURE SURGERY Right 05/28/2019   distal radius fracture   HERNIA REPAIR  left inguinal hernia repair as an infant   KYPHOPLASTY N/A 02/11/2016   Procedure: KYPHOPLASTY  L2,T9;  Surgeon: Kennedy Bucker, MD;  Location: [facility] ORS;  Service: Orthopedics;  Laterality: N/A;   KYPHOPLASTY N/A 03/15/2016    Procedure: KYPHOPLASTY L3;  Surgeon: Kennedy Bucker, MD;  Location: [facility] ORS;  Service: Orthopedics;  Laterality: N/A;   OPEN REDUCTION INTERNAL FIXATION (ORIF) DISTAL RADIAL FRACTURE Right 05/28/2019   Procedure: OPEN REDUCTION INTERNAL FIXATION (ORIF) DISTAL RADIAL FRACTURE;  Surgeon: Kennedy Bucker, MD;  Location: [facility] ORS;  Service: Orthopedics;  Laterality: Right;   skin cancer removed     TONSILLECTOMY     Patient Active Problem List   Diagnosis Date Noted   Acute respiratory failure due to COVID-19 (HCC) 05/28/2019   Alcohol dependence with uncomplicated withdrawal (HCC) 01/23/2018   Alcohol withdrawal (HCC) 01/23/2018   GIB (gastrointestinal bleeding) 12/09/2017   A-fib (HCC) 12/09/2017   Esophageal obstruction 10/10/2016   Esophageal ulceration 10/10/2016   GI bleed 09/20/2016   Low vitamin B12 level 03/18/2016   S/P kyphoplasty 03/18/2016   Basal cell carcinoma 02/08/2016   Atrial fibrillation (HCC) 01/27/2016   Colitis 01/27/2016   HLD (hyperlipidemia) 01/27/2016   Anxiety 01/27/2016   Hypokalemia 01/27/2016   Major depressive disorder, recurrent episode, moderate (HCC) 04/14/2015   Anxiety, generalized 12/06/2013   Parkinson disease 08/07/2013   Dysphagia 05/12/2013   Esophageal mass 05/12/2013   GERD (gastroesophageal reflux disease) 05/12/2013   Vomiting 05/12/2013   Open bite of lower leg 05/10/2013   Dog bite of lower leg 05/10/2013   Abnormal finding on liver function 03/25/2013   Benign neoplasm 03/25/2013   Dizziness 03/25/2013   Type 2 diabetes mellitus (HCC) 03/25/2013   BP (high blood pressure) 03/25/2013   Awareness of heartbeats 03/25/2013   Pure hypercholesterolemia 03/25/2013   Infective tonsillitis 03/25/2013   Adenomatous polyp 03/25/2013   ONSET DATE: 10 years   REFERRING DIAG: Parkinson's Disease  THERAPY DIAG:  No diagnosis found.  Rationale for Evaluation and Treatment: Rehabilitation  SUBJECTIVE:  SUBJECTIVE STATEMENT: Pt reports he  is doing well today. Pt. Reports that he tried to open up a soda bottle and still required his wife's help. Pt. Reports that he is going to his sons golf fundraiser this weekend . Pt accompanied by: self; spouse present in lobby, but did not follow for eval  PERTINENT HISTORY: Pt has been working with outpatient PT here at [facility] since end of Feb 2024.  OT ordered d/t worsening PD symptoms contributing to functional decline with ADLs.   PRECAUTIONS: Fall  WEIGHT BEARING RESTRICTIONS: No  PAIN:  Are you having pain? No  FALLS: Has patient fallen in last 6 months? Yes. Number of falls 1  LIVING ENVIRONMENT: Lives with: lives with their spouse Lives in: split level but pt remains on main level to avoid steps Stairs: Yes: Internal: 2 steps; can reach both Has following equipment at home: Quad cane small base, Walker - 4 wheeled, bed side commode, Grab bars, and pull down seat for shower, hand held shower hose,   PLOF: Needs assistance with ADLs; pt reports that spouse assists but is consistent to always let pt try things first before she automatically helps.    PATIENT GOALS: To be more indep  OBJECTIVE:  HAND DOMINANCE: Right (most affected side from PD)  ADLs: Overall ADLs: spouse is primary caregiver Transfers/ambulation related to ADLs: uses RW Eating: spouse cuts food; pt reports having to use L non-dominant hand to eat for  the last 5-6 yrs Grooming: set up; assist to squeeze toothpaste and denture paste; uses L non-dominant hand UB Dressing: set up; assist with clothing fasteners LB Dressing: Min A; assist with socks, dons slip on shoes with set up (has tried a sockaid but was unsuccessful) Toileting: modified indep Bathing: Set up/min A; spouse squeezes shampoo into pt's hands, spouse helps to towel dry  Tub Shower transfers: distant supv Equipment:  see above   IADLs: Shopping: pt can accompany spouse by pushing a shopping cart.  Light housekeeping: pt can drag trash to bin  and can roll the bin to the road and back Meal Prep: spouse manages  Community mobility: spouse drives; uses rollator for longer distances  Medication management: spouse manages pills using weekly pill organizer.   Financial management: spouse manages  Handwriting: Mild micrographia and <25% legible  MOBILITY STATUS: Hx of falls  POSTURE COMMENTS:  rounded shoulders and forward head, R shoulder elevated  ACTIVITY TOLERANCE: Activity tolerance: TBD and assessed further with functional activities   FUNCTIONAL OUTCOME MEASURES: FOTO: TBD   UPPER EXTREMITY ROM:    Active ROM Right eval Left eval Right 07/12/2022 Left 07/12/2022  Shoulder flexion 118 85 120 117  Shoulder abduction 128 124 132 127  Shoulder adduction      Shoulder extension      Shoulder internal rotation [facility] WFL  Shoulder external rotation [facility] WFL  Elbow flexion      Elbow extension      Wrist flexion 55 76 59 75  Wrist extension 57 75 61 71  Wrist ulnar deviation      Wrist radial deviation      Wrist pronation [facility] WFL WFL  Wrist supination 63 75 66 81  (Blank rows = not tested)  UPPER EXTREMITY MMT:     MMT Right eval Left eval Right 07/12/2022 Left 07/12/2022  Shoulder flexion 4 4 4+ 4+  Shoulder abduction 4+ 4+ 4+ 4+  Shoulder adduction      Shoulder extension      Shoulder internal rotation      Shoulder external rotation      Middle trapezius      Lower trapezius      Elbow flexion 4+ 4+ 4+ 4+  Elbow extension 4+ 4+ 4+ 4+  Wrist flexion 4+ 4+ 4+ 4+  Wrist extension 4+ 4+ 4+ 4+  Wrist ulnar deviation      Wrist radial deviation      Wrist pronation      Wrist supination      (Blank rows = not tested)  HAND FUNCTION: Grip strength: Right: 16 lbs; Left: 38 lbs, Lateral pinch: Right: 10 lbs, Left: 14 lbs, and 3 point pinch: Right: 9 lbs, Left: 11 lbs 07/12/2022: Grip strength: Right: 16 lbs; Left: 40 lbs, Lateral pinch: Right: 10 lbs, Left: 14 lbs, and 3 point pinch: Right: 9  lbs, Left: 15 lbs COORDINATION: 9 Hole Peg test: Right: 38 sec; Left: 40 sec 07/12/2022: 9 Hole Peg test: Right: 35 sec; Left: 36 sec SENSATION: WFL  EDEMA: None  MUSCLE TONE: R/L normal  COGNITION: Overall cognitive status: Within functional limits for tasks assessed  VISION: Subjective report: wears glasses all the time    PRAXIS: Impaired: Motor planning  OBSERVATIONS:  R SF PIP flexion contracture 105* from 5-6 years ago from a fall  TODAY'S TREATMENT:  DATE: 07/19/22:  Therapeutic Exercise:  Pt. worked on [employer] with use of a hand gripper set at 6.6# on the R x 2 trials and 17.9# on the left  x's 2 trials using the hand grip strengthener to remove jumbo pegs from pegboard. Intermittent min vc and tactile cues for set up of hand gripper in hand to maximize closure with each gripping rep. Pt. Worked on using lateral pinch to place yellow, red, green, blue, and black onto horizontal dowel with the L hand x 1 trial. Pt. Worked on using lateral pinch with the R hand to place yellow, red, green and blue resistive clips onto horizontal dowel x 1 trial.   Neuromuscular Reeducation: Pt. Worked on using tip pinch and thumb opposition alternating between 2nd-5th digit to place 1 inch circular pegs into vertical peg board. Pt. Worked on using tip pinch and thumb opposition alternating between 2nd-5th digit to remove 1 inch circular pegs and storing as many as he could within the palm of his hand before discarding them back into the container using translatory movements. PATIENT EDUCATION: Education details: Potential benefit of wrist weight to minimize UE tremor Person educated: Patient Education method: Explanation and Verbal cues, demo Education comprehension: verbalized understanding, demonstrated understanding  HOME EXERCISE PROGRAM: Theraputty    GOALS: Goals reviewed with patient? Yes  SHORT TERM GOALS: Target date: 07/07/22 (6 weeks)  Pt will be indep to perform HEP for improving bilat hand strength and coordination skills. Baseline: Eval: not yet initiated 07/12/2022: Completing theraputty exercises independent at home Goal status: Met/Achieved  LONG TERM GOALS: Target date: 08/18/22 (12 weeks)  Pt will increase FOTO score to (TBD) or better to indicate improvement in self perceived functional use of the R arm with daily tasks. Baseline: Eval: TBD 07/12/2022: TBD Goal status: INITIAL  2.  Pt will increase R grip strength by 5 or more lbs to more easily hold and stabilize ADL supplies in the R dominant hand. Baseline: R grip 16 lbs; limited from PD but also by 5th digit PIP flexion contracture, as this digit can not engage in gripping (L 38 lbs) 07/12/2022: Grip strength: Right: 16 lbs; limited from PD but also by 5th digit PIP flexion contracture, as this digit still can not engage in gripping (L 40 lbs) Goal status: Ongoing  3.  Pt will improve R hand FMC/dexterity skills to be able to sign his name on medical/legal documents with 75% legibility, using adapted writing aids as needed. Baseline: Eval: <25% legible and pt verbalizes embarrassment when attempting to sign his name on forms 07/12/2022: 75% legibility when writing his name in print, however reports he is still not satisfied with his signature looks. Goal status: Ongoing  4.  Pt will increase R FMC/GMC skills to engage the RUE into UB ADLs at least 50% of the time. Baseline: Eval: Pt uses the L non-dominant arm for self care tasks. 07/12/22: Pt. Reports that he has been trying to incorporate his R hand while still using the L arm to complete all self care tasks.  Goal status: Ongoing  ASSESSMENT:  CLINICAL IMPRESSION:  Pt. Reports that he is going to bring a soda bottle to next therapy session to practice with. Pt. Tolerated 6.6# on the R x 2 trials and 17.9# on the  left  x's 2 trials using the hand grip strengthener to remove jumbo pegs from pegboard. Pt. Required frequent rest breaks throughout the second trial. Pt. continues to present with decreased  grip on  the R d/t R Small Finger flexion contracture preventing this digit from engaging in gripping on this hand.Pt. Was able to use lateral pinch on the R and L to place resistive clips onto horizontal dowel however is unable to use black resistance for the R hand. Pt. Was able to use tip pinch and thumb opposition alternating between the 2nd and 5th digit to place pegs onto peg board. Pt. Was able to use tip pinch and thumb opposition alternating between the 2nd-5th digit to remove the 1 inch circular pegs. Pt. Was able to store 4 pegs within his R hand before discarding them using translatory movements. Pt. Experienced difficulties when performing translatory movements and required increased time. Pt. continues to benefit from skilled OT for increasing bilateral hand strength, improve coordination, specifically working to increase engagement of the RUE during self care tasks.    PERFORMANCE DEFICITS: in functional skills including ADLs, IADLs, coordination, dexterity, ROM, strength, flexibility, Fine motor control, Gross motor control, mobility, balance, body mechanics, endurance, decreased knowledge of use of DME, and UE functional use, cognitive skills including emotional and memory, and psychosocial skills including coping strategies, environmental adaptation, habits, and routines and behaviors.   IMPAIRMENTS: are limiting patient from ADLs, IADLs, and leisure.   CO-MORBIDITIES: has co-morbidities such as anxiety, depression  that affects occupational performance. Patient will benefit from skilled OT to address above impairments and improve overall function.  MODIFICATION OR ASSISTANCE TO COMPLETE EVALUATION: No modification of tasks or assist necessary to complete an evaluation.  OT OCCUPATIONAL PROFILE AND  HISTORY: Problem focused assessment: Including review of records relating to presenting problem.  CLINICAL DECISION MAKING: Moderate - several treatment options, min-mod task modification necessary  REHAB POTENTIAL: Good for goals  EVALUATION COMPLEXITY: Moderate    PLAN:  OT FREQUENCY: 2x/week  OT DURATION: 12 weeks  PLANNED INTERVENTIONS: self care/ADL training, therapeutic exercise, therapeutic activity, neuromuscular re-education, manual therapy, passive range of motion, balance training, functional mobility training, moist heat, cryotherapy, patient/family education, cognitive remediation/compensation, psychosocial skills training, energy conservation, coping strategies training, and DME and/or AE instructions  RECOMMENDED OTHER SERVICES: None at this time  CONSULTED AND AGREED WITH PLAN OF CARE: Patient  PLAN FOR NEXT SESSION: initiate HEP, complete Sudie Grumbling, OTS 07/21/2022 3:05 PM  This entire session was performed under the direct supervision and direction of a licensed therapist. I have personally read, edited, and approve of the note as written.  Olegario Messier, MS, OTR/L  07/21/2022

## 2022-07-26 ENCOUNTER — Ambulatory Visit: Payer: Medicare Other | Admitting: Physical Therapy

## 2022-07-26 ENCOUNTER — Ambulatory Visit: Payer: Medicare Other | Admitting: Occupational Therapy

## 2022-07-26 DIAGNOSIS — R262 Difficulty in walking, not elsewhere classified: Secondary | ICD-10-CM | POA: Diagnosis not present

## 2022-07-26 DIAGNOSIS — R278 Other lack of coordination: Secondary | ICD-10-CM

## 2022-07-26 DIAGNOSIS — M6281 Muscle weakness (generalized): Secondary | ICD-10-CM

## 2022-07-26 DIAGNOSIS — R2681 Unsteadiness on feet: Secondary | ICD-10-CM

## 2022-07-26 DIAGNOSIS — R2689 Other abnormalities of gait and mobility: Secondary | ICD-10-CM

## 2022-07-26 NOTE — Therapy (Signed)
Outpatient Physical Therapy Treatment/Physical Therapy Progress Note   Dates of reporting period  06/16/22   to   07/26/22     Patient Details  Name: Charles Stanton MRN: [id number] Date of Birth: May 06, 1955 Referring Provider (PT): Dr. Cristopher Peru   Encounter Date: 07/26/2022  END OF SESSION:    PT End of Session - 07/26/22 1316     Visit Number 40    Number of Visits 62    Date for PT Re-Evaluation 10/11/22    Authorization Type Medicare A & B primary; federal employee secondary    Authorization Time Period -10/11/22    Progress Note Due on Visit 40    PT Start Time 1317    PT Stop Time 1359    PT Time Calculation (min) 42 min    Equipment Utilized During Treatment Gait belt    Activity Tolerance Patient tolerated treatment well;No increased pain    Behavior During Therapy [facility] for tasks assessed/performed                      Past Medical History:  Diagnosis Date   Abnormal liver function    Anemia    Anxiety    Atrial fibrillation (HCC)    Cancer (HCC) 2017   skin   Colonic mass    Depression    Dizziness    Dry cough    History of colon polyps    Hypercholesteremia    Hyperlipemia    Hypertension    Palpitations    Parkinson's disease    Parkinson's disease    PVD (peripheral vascular disease) (HCC)    Tonsillitis 03/25/2013   Tremors of nervous system     Past Surgical History:  Procedure Laterality Date   BACK SURGERY     Kyphoplasty   March 2018   COLONOSCOPY     COLONOSCOPY WITH PROPOFOL N/A 12/12/2017   Procedure: COLONOSCOPY WITH PROPOFOL;  Surgeon: Wyline Mood, MD;  Location: [facility] ENDOSCOPY;  Service: Gastroenterology;  Laterality: N/A;   ESOPHAGOGASTRODUODENOSCOPY N/A 12/11/2017   Procedure: ESOPHAGOGASTRODUODENOSCOPY (EGD);  Surgeon: Wyline Mood, MD;  Location: [facility] ENDOSCOPY;  Service: Gastroenterology;  Laterality: N/A;   ESOPHAGOGASTRODUODENOSCOPY N/A 10/08/2021   Procedure: ESOPHAGOGASTRODUODENOSCOPY (EGD);  Surgeon:  Regis Bill, MD;  Location: [facility] ENDOSCOPY;  Service: Endoscopy;  Laterality: N/A;   ESOPHAGOGASTRODUODENOSCOPY (EGD) WITH PROPOFOL N/A 09/21/2016   Procedure: ESOPHAGOGASTRODUODENOSCOPY (EGD) WITH PROPOFOL;  Surgeon: Wyline Mood, MD;  Location: [facility] ENDOSCOPY;  Service: Gastroenterology;  Laterality: N/A;   ESOPHAGOGASTRODUODENOSCOPY (EGD) WITH PROPOFOL N/A 11/07/2016   Procedure: ESOPHAGOGASTRODUODENOSCOPY (EGD) WITH PROPOFOL;  Surgeon: Wyline Mood, MD;  Location: [facility] ENDOSCOPY;  Service: Gastroenterology;  Laterality: N/A;   ESOPHAGOGASTRODUODENOSCOPY (EGD) WITH PROPOFOL N/A 10/15/2019   Procedure: ESOPHAGOGASTRODUODENOSCOPY (EGD) WITH PROPOFOL;  Surgeon: Regis Bill, MD;  Location: [facility] ENDOSCOPY;  Service: Endoscopy;  Laterality: N/A;   ESOPHAGOGASTRODUODENOSCOPY (EGD) WITH PROPOFOL N/A 10/28/2019   Procedure: ESOPHAGOGASTRODUODENOSCOPY (EGD) WITH PROPOFOL;  Surgeon: Regis Bill, MD;  Location: [facility] ENDOSCOPY;  Service: Endoscopy;  Laterality: N/A;   ESOPHAGOGASTRODUODENOSCOPY (EGD) WITH PROPOFOL N/A 12/02/2019   Procedure: ESOPHAGOGASTRODUODENOSCOPY (EGD) WITH PROPOFOL;  Surgeon: Regis Bill, MD;  Location: [facility] ENDOSCOPY;  Service: Endoscopy;  Laterality: N/A;   ESOPHAGOGASTRODUODENOSCOPY (EGD) WITH PROPOFOL N/A 02/18/2020   Procedure: ESOPHAGOGASTRODUODENOSCOPY (EGD) WITH PROPOFOL;  Surgeon: Regis Bill, MD;  Location: [facility] ENDOSCOPY;  Service: Endoscopy;  Laterality: N/A;   ESOPHAGOGASTRODUODENOSCOPY (EGD) WITH PROPOFOL N/A 08/02/2021   Procedure: ESOPHAGOGASTRODUODENOSCOPY (EGD) WITH PROPOFOL;  Surgeon: Regis Bill, MD;  Location: [facility] ENDOSCOPY;  Service: Endoscopy;  Laterality: N/A;  REQUEST AM   ESOPHAGOGASTRODUODENOSCOPY (EGD) WITH PROPOFOL N/A 10/05/2021   Procedure: ESOPHAGOGASTRODUODENOSCOPY (EGD) WITH PROPOFOL;  Surgeon: Regis Bill, MD;  Location: [facility] ENDOSCOPY;  Service: Endoscopy;  Laterality: N/A;   FRACTURE SURGERY Right  05/28/2019   distal radius fracture   HERNIA REPAIR     left inguinal hernia repair as an infant   KYPHOPLASTY N/A 02/11/2016   Procedure: KYPHOPLASTY  L2,T9;  Surgeon: Kennedy Bucker, MD;  Location: [facility] ORS;  Service: Orthopedics;  Laterality: N/A;   KYPHOPLASTY N/A 03/15/2016   Procedure: KYPHOPLASTY L3;  Surgeon: Kennedy Bucker, MD;  Location: [facility] ORS;  Service: Orthopedics;  Laterality: N/A;   OPEN REDUCTION INTERNAL FIXATION (ORIF) DISTAL RADIAL FRACTURE Right 05/28/2019   Procedure: OPEN REDUCTION INTERNAL FIXATION (ORIF) DISTAL RADIAL FRACTURE;  Surgeon: Kennedy Bucker, MD;  Location: [facility] ORS;  Service: Orthopedics;  Laterality: Right;   skin cancer removed     TONSILLECTOMY       Subjective Assessment -      Subjective  Pt reports doing well today. Pt denies any recent falls/stumbles since prior session. Pt denies any updates to medications or medical appointment since prior session. Pt reports good compliance with HEP when time permits.      Pertinent History Charles Stanton is a 66yoM who was prescribed with Parkinson's Disease ~ 10 years.  Pt is followed by neurology, reports good reponse to medications which help control his tremors somewhat. Pt uses a SPC for AMB, also uses a walker for longer distance walking. Pt goes into community almost daily (they like to eat out) which includes lots of SPC [place] eand navigation of 4 stairs at home.    Currently in Pain? No/denies             TREATMENT: Unless otherwise stated, CGA was provided and gait belt donned in order to ensure pt safety   Physical therapy treatment session today consisted of completing assessment of goals and administration of testing as demonstrated and documented in flow sheet, treatment, and goals section of this note. Addition treatments may be found below.   PWR! Seated up, twist, rock and step for warm up of postural and truncal muscles.   PWR! Up works on postural strengthening and antigravity extension, PRW!  Rock working on [employer] shifting, PRW! Twist targeting trunk rotation and PRW! Step targeting transition movements. PWR! Moves target bradykinesia, rigidity, and dyskinesia through targeted functional movements that address four core movement difficulties for people with Parkinson's disease.   Ambulation with SPC to cafeteria and challenged to walk through tight spaces and areas with intermittent perturbations, pt reports 70% confidence with activity.   PWR! Step all 4 modified with chair anterior   Standing PWR! Step with unilateral UE support x 10 reps ea way with YTB around ankles for increased gluteal activation.   X 2 rounds       PT Education -     Education Details Pt educated throughout session about proper posture and technique with exercises. Improved exercise technique, movement at target joints, use of target muscles after min to mod verbal, visual, tactile cues.      Person(s) Educated Patient    Methods Explanation;Demonstration ; handout   Comprehension Verbalized understanding;Returned demonstration;Need further instruction           PT Short Term Goals -       PT SHORT TERM GOAL #1  Title Pt to report regular performance of exercises prescribed for home and a sense of improvements in strength and balance AEB them not being as challenging anymore.    Baseline Will issue on visit 2    Time 4    Period Weeks    Status met   Target Date 03/15/22      PT SHORT TERM GOAL #2   Title Pt to demonstrate improved power AEB improved 5xSTS hands free in <18sec.    Baseline 03/01/22: 22sec hands free, author provideing foot block bilat 03/31/22: 14.82sec 4/23:17.3 sec   Time 4    Period Weeks    Status Met    Target Date 03/29/22               PT Long Term Goals - Target goal date for all remaining long term goals is 07/19/2022        PT LONG TERM GOAL #1   Title Pt to improve score on FOTO survey to 57 to indicate reduced difficulty with basic  mobility required for ADL performance.    Baseline 03/01/22:49 04/29/22:56%. 06/16/22: 60   Time 8    Period Weeks    Status Met          PT LONG TERM GOAL #2   Title Pt to improve tolerance to overground AMB c SPC AEB performance >825ft.    Baseline 2/27: fatigued after 434ft lap performed in four minutes ten seconds. 03/31/22: 744ft with SPC no rest break 04/26/22: 794 ft 06/16/22: 903 ft with SPC, no rest.   Time 8    Period Weeks    Status Met         PT LONG TERM GOAL #3   Title Pt to demonstrate improved leg power AEB 5xSTS from chair <14sec hands free and without need for minGuard assist for safety and without need for foot block.    Baseline 03/01/22: 22sec hands free, feet blocked, minGuard assist  03/31/22: 14.82sec  4/23:17.3 sec 06/16/22: 13.87 sec 7/16: 11.55 sec   Time 8     Period Weeks    Status MET         PT LONG TERM GOAL #4   Title Pt to demonstrate improvement in balance performance AEB >6 improvement BERG Balance for balance assessment.    Baseline 41 on 03/03/22. 3/38/24: 42 4/23: 45 06/16/22: 48   Time 8    Period Weeks    Status Met          5.  Pt will improve distance with LRAD to 900 feet or greater in order to indicate further progress to community ambulation distances Baseline: 794 ft with SPC , 06/16/22: 903 ft with SPC. 07/19/22: 905 feet Goal status: Met  6.  Patient will improve modified DGI (with use of cane) score > 4 points or greater in order to indicate improved balance and decreased risk of falls.  Baseline: 6/18: 11 7/16: 14  Goal status: IN PROGRESS  7. Patient will be able to withstand various light perturbations while preforming ambulation with LRAD utilizing safe balance strategies in order to simulate safe and confident walking in crowded places. Baseline: Pt currently does not feel safe preforming task, could walk through cafeteria to get baseline. 7/23: 70% confidence with activity, mild LOB with perturbations.  Goal Status: IN  PROGRESS  8. Pt will be compliant and independent with Parkinson's based HEP by reciting and demonstrating full PWR! Move sets in seated and or standing with  no cueing from therapist. Baseline: Pt currently needs demonstration or verbal cueing for proper completion  7/23: cues required for proper completion with PWR! Up, rock, and twist in seated position  Goal Status: IN PROGRESS     Plan     Clinical Impression Statement Pt presents to PT for progress note this date. Pt shows progress toward his new goals of ambulating in crowded environments but still has difficulty recovering from perturbations and with overall confidence with this task. Pt still having difficulty with proper recall of HEP but is able to complete with practice and cues within session. Will continue ot work toward goal of independent PWR! Exercise so pt is able to being maintenance program outside of PT services following discharge. Patient's condition has the potential to improve in response to therapy. Maximum improvement is yet to be obtained. The anticipated improvement is attainable and reasonable in a generally predictable time. Pt will continue to benefit from skilled physical therapy intervention to address impairments, improve QOL, and attain therapy goals.     Personal Factors and Comorbidities Time since onset of injury/illness/exacerbation;Past/Current Experience    Examination-Activity Limitations Locomotion Level;Transfers;Bed Mobility;Reach Overhead;Hygiene/Grooming;Dressing;Toileting;Stairs;Bend    Stability/Clinical Decision Making Evolving/Moderate complexity    Clinical Decision Making Moderate    Rehab Potential Good    PT Frequency 2x / week    PT Duration 8 weeks    PT Treatment/Interventions ADLs/Self Care Home Management;Moist Heat;Gait training;Stair training;Functional mobility training;Therapeutic activities;Therapeutic exercise;Balance training;Neuromuscular re-education;Patient/family education     PT Next Visit Plan .  STS PWR! Up with eventual addition of PWR! Step. Work on weight shift cues with STS. Dynamic balance/gait training as indicated.    PT Home Exercise Plan 03/01/22: discussed finding and elevated sit surface for future STS exercises (something ~18-19 inches high)    Consulted and Agree with Plan of Care Patient             Patient will benefit from skilled therapeutic intervention in order to improve the following deficits and impairments:  balance, strength, coordination, gait, transfers. Posture. ROM   Visit Diagnosis: Difficulty in walking, not elsewhere classified  Other abnormalities of gait and mobility  Unsteadiness on feet    Problem List Patient Active Problem List   Diagnosis Date Noted   Acute respiratory failure due to COVID-19 (HCC) 05/28/2019   Alcohol dependence with uncomplicated withdrawal (HCC) 01/23/2018   Alcohol withdrawal (HCC) 01/23/2018   GIB (gastrointestinal bleeding) 12/09/2017   A-fib (HCC) 12/09/2017   Esophageal obstruction 10/10/2016   Esophageal ulceration 10/10/2016   GI bleed 09/20/2016   Low vitamin B12 level 03/18/2016   S/P kyphoplasty 03/18/2016   Basal cell carcinoma 02/08/2016   Atrial fibrillation (HCC) 01/27/2016   Colitis 01/27/2016   HLD (hyperlipidemia) 01/27/2016   Anxiety 01/27/2016   Hypokalemia 01/27/2016   Major depressive disorder, recurrent episode, moderate (HCC) 04/14/2015   Anxiety, generalized 12/06/2013   Parkinson disease 08/07/2013   Dysphagia 05/12/2013   Esophageal mass 05/12/2013   GERD (gastroesophageal reflux disease) 05/12/2013   Vomiting 05/12/2013   Open bite of lower leg 05/10/2013   Dog bite of lower leg 05/10/2013   Abnormal finding on liver function 03/25/2013   Benign neoplasm 03/25/2013   Dizziness 03/25/2013   Type 2 diabetes mellitus (HCC) 03/25/2013   BP (high blood pressure) 03/25/2013   Awareness of heartbeats 03/25/2013   Pure hypercholesterolemia 03/25/2013    Infective tonsillitis 03/25/2013   Adenomatous polyp 03/25/2013      Norman Herrlich PT ,  DPT Physical Therapist- [place]  [facility]

## 2022-07-26 NOTE — Therapy (Addendum)
OUTPATIENT OCCUPATIONAL THERAPY NEURO TREATMENT NOTE  Patient Name: Charles Stanton MRN: [id number] DOB:[birth year], 67 y.o., male Today's Date: 07/26/2022  PCP: Dr. Barbette Reichmann REFERRING PROVIDER: Dr. Cristopher Peru  END OF SESSION:  OT End of Session - 07/26/22 1629     Visit Number 14 (P)     Number of Visits 24 (P)     Date for OT Re-Evaluation 08/18/22 (P)     OT Start Time 1400 (P)     OT Stop Time 1445 (P)     OT Time Calculation (min) 45 min (P)     Equipment Utilized During Treatment Cane (P)     Activity Tolerance Patient tolerated treatment well (P)     Behavior During Therapy WFL for tasks assessed/performed (P)              Past Medical History:  Diagnosis Date   Abnormal liver function    Anemia    Anxiety    Atrial fibrillation (HCC)    Cancer (HCC) 2017   skin   Colonic mass    Depression    Dizziness    Dry cough    History of colon polyps    Hypercholesteremia    Hyperlipemia    Hypertension    Palpitations    Parkinson's disease    Parkinson's disease    PVD (peripheral vascular disease) (HCC)    Tonsillitis 03/25/2013   Tremors of nervous system    Past Surgical History:  Procedure Laterality Date   BACK SURGERY     Kyphoplasty   March 2018   COLONOSCOPY     COLONOSCOPY WITH PROPOFOL N/A 12/12/2017   Procedure: COLONOSCOPY WITH PROPOFOL;  Surgeon: Wyline Mood, MD;  Location: [facility] ENDOSCOPY;  Service: Gastroenterology;  Laterality: N/A;   ESOPHAGOGASTRODUODENOSCOPY N/A 12/11/2017   Procedure: ESOPHAGOGASTRODUODENOSCOPY (EGD);  Surgeon: Wyline Mood, MD;  Location[facility] ENDOSCOPY;  Service: Gastroenterology;  Laterality: N/A;   ESOPHAGOGASTRODUODENOSCOPY N/A 10/08/2021   Procedure: ESOPHAGOGASTRODUODENOSCOPY (EGD);  Surgeon: Regis Bill, MD;  Location: [facility] ENDOSCOPY;  Service: Endoscopy;  Laterality: N/A;   ESOPHAGOGASTRODUODENOSCOPY (EGD) WITH PROPOFOL N/A 09/21/2016   Procedure: ESOPHAGOGASTRODUODENOSCOPY (EGD) WITH PROPOFOL;   Surgeon: Wyline Mood, MD;  Location: [facility] ENDOSCOPY;  Service: Gastroenterology;  Laterality: N/A;   ESOPHAGOGASTRODUODENOSCOPY (EGD) WITH PROPOFOL N/A 11/07/2016   Procedure: ESOPHAGOGASTRODUODENOSCOPY (EGD) WITH PROPOFOL;  Surgeon: Wyline Mood, MD;  Location: [facility] ENDOSCOPY;  Service: Gastroenterology;  Laterality: N/A;   ESOPHAGOGASTRODUODENOSCOPY (EGD) WITH PROPOFOL N/A 10/15/2019   Procedure: ESOPHAGOGASTRODUODENOSCOPY (EGD) WITH PROPOFOL;  Surgeon: Regis Bill, MD;  Location: [facility] ENDOSCOPY;  Service: Endoscopy;  Laterality: N/A;   ESOPHAGOGASTRODUODENOSCOPY (EGD) WITH PROPOFOL N/A 10/28/2019   Procedure: ESOPHAGOGASTRODUODENOSCOPY (EGD) WITH PROPOFOL;  Surgeon: Regis Bill, MD;  Location: [facility] ENDOSCOPY;  Service: Endoscopy;  Laterality: N/A;   ESOPHAGOGASTRODUODENOSCOPY (EGD) WITH PROPOFOL N/A 12/02/2019   Procedure: ESOPHAGOGASTRODUODENOSCOPY (EGD) WITH PROPOFOL;  Surgeon: Regis Bill, MD;  Location: [facility] ENDOSCOPY;  Service: Endoscopy;  Laterality: N/A;   ESOPHAGOGASTRODUODENOSCOPY (EGD) WITH PROPOFOL N/A 02/18/2020   Procedure: ESOPHAGOGASTRODUODENOSCOPY (EGD) WITH PROPOFOL;  Surgeon: Regis Bill, MD;  Location: [facility] ENDOSCOPY;  Service: Endoscopy;  Laterality: N/A;   ESOPHAGOGASTRODUODENOSCOPY (EGD) WITH PROPOFOL N/A 08/02/2021   Procedure: ESOPHAGOGASTRODUODENOSCOPY (EGD) WITH PROPOFOL;  Surgeon: Regis Bill, MD;  Location: [facility] ENDOSCOPY;  Service: Endoscopy;  Laterality: N/A;  REQUEST AM   ESOPHAGOGASTRODUODENOSCOPY (EGD) WITH PROPOFOL N/A 10/05/2021   Procedure: ESOPHAGOGASTRODUODENOSCOPY (EGD) WITH PROPOFOL;  Surgeon: Regis Bill, MD;  Location: [facility] ENDOSCOPY;  Service: Endoscopy;  Laterality: N/A;   FRACTURE SURGERY Right 05/28/2019   distal radius fracture   HERNIA REPAIR     left inguinal hernia repair as an infant   KYPHOPLASTY N/A 02/11/2016   Procedure: KYPHOPLASTY  L2,T9;  Surgeon: Kennedy Bucker, MD;  Location: [facility] ORS;  Service:  Orthopedics;  Laterality: N/A;   KYPHOPLASTY N/A 03/15/2016   Procedure: KYPHOPLASTY L3;  Surgeon: Kennedy Bucker, MD;  Location: [facility] ORS;  Service: Orthopedics;  Laterality: N/A;   OPEN REDUCTION INTERNAL FIXATION (ORIF) DISTAL RADIAL FRACTURE Right 05/28/2019   Procedure: OPEN REDUCTION INTERNAL FIXATION (ORIF) DISTAL RADIAL FRACTURE;  Surgeon: Kennedy Bucker, MD;  Location: [facility] ORS;  Service: Orthopedics;  Laterality: Right;   skin cancer removed     TONSILLECTOMY     Patient Active Problem List   Diagnosis Date Noted   Acute respiratory failure due to COVID-19 (HCC) 05/28/2019   Alcohol dependence with uncomplicated withdrawal (HCC) 01/23/2018   Alcohol withdrawal (HCC) 01/23/2018   GIB (gastrointestinal bleeding) 12/09/2017   A-fib (HCC) 12/09/2017   Esophageal obstruction 10/10/2016   Esophageal ulceration 10/10/2016   GI bleed 09/20/2016   Low vitamin B12 level 03/18/2016   S/P kyphoplasty 03/18/2016   Basal cell carcinoma 02/08/2016   Atrial fibrillation (HCC) 01/27/2016   Colitis 01/27/2016   HLD (hyperlipidemia) 01/27/2016   Anxiety 01/27/2016   Hypokalemia 01/27/2016   Major depressive disorder, recurrent episode, moderate (HCC) 04/14/2015   Anxiety, generalized 12/06/2013   Parkinson disease 08/07/2013   Dysphagia 05/12/2013   Esophageal mass 05/12/2013   GERD (gastroesophageal reflux disease) 05/12/2013   Vomiting 05/12/2013   Open bite of lower leg 05/10/2013   Dog bite of lower leg 05/10/2013   Abnormal finding on liver function 03/25/2013   Benign neoplasm 03/25/2013   Dizziness 03/25/2013   Type 2 diabetes mellitus (HCC) 03/25/2013   BP (high blood pressure) 03/25/2013   Awareness of heartbeats 03/25/2013   Pure hypercholesterolemia 03/25/2013   Infective tonsillitis 03/25/2013   Adenomatous polyp 03/25/2013   ONSET DATE: 10 years   REFERRING DIAG: Parkinson's Disease  THERAPY DIAG:  Muscle weakness (generalized)  Other lack of  coordination  Rationale for Evaluation and Treatment: Rehabilitation  SUBJECTIVE:  SUBJECTIVE STATEMENT: Pt reports he is doing well today. Pt. Reports that he went to his sons golf fundraiser over the weekend and had a good time.  Pt accompanied by: self; spouse present in lobby, but did not follow for eval  PERTINENT HISTORY: Pt has been working with outpatient PT here at [facility] since end of Feb 2024.  OT ordered d/t worsening PD symptoms contributing to functional decline with ADLs.   PRECAUTIONS: Fall  WEIGHT BEARING RESTRICTIONS: No  PAIN:  Are you having pain? No pain reported today.  FALLS: Has patient fallen in last 6 months? Yes. Number of falls 1  LIVING ENVIRONMENT: Lives with: lives with their spouse Lives in: split level but pt remains on main level to avoid steps Stairs: Yes: Internal: 2 steps; can reach both Has following equipment at home: Quad cane small base, Walker - 4 wheeled, bed side commode, Grab bars, and pull down seat for shower, hand held shower hose,   PLOF: Needs assistance with ADLs; pt reports that spouse assists but is consistent to always let pt try things first before she automatically helps.    PATIENT GOALS: To be more indep  OBJECTIVE:  HAND DOMINANCE: Right (most affected side from PD)  ADLs: Overall ADLs: spouse is primary caregiver Transfers/ambulation  related to ADLs: uses RW Eating: spouse cuts food; pt reports having to use L non-dominant hand to eat for the last 5-6 yrs Grooming: set up; assist to squeeze toothpaste and denture paste; uses L non-dominant hand UB Dressing: set up; assist with clothing fasteners LB Dressing: Min A; assist with socks, dons slip on shoes with set up (has tried a sockaid but was unsuccessful) Toileting: modified indep Bathing: Set up/min A; spouse squeezes shampoo into pt's hands, spouse helps to towel dry  Tub Shower transfers: distant supv Equipment:  see above   IADLs: Shopping: pt can accompany  spouse by pushing a shopping cart.  Light housekeeping: pt can drag trash to bin and can roll the bin to the road and back Meal Prep: spouse manages  Community mobility: spouse drives; uses rollator for longer distances  Medication management: spouse manages pills using weekly pill organizer.   Financial management: spouse manages  Handwriting: Mild micrographia and <25% legible  MOBILITY STATUS: Hx of falls  POSTURE COMMENTS:  rounded shoulders and forward head, R shoulder elevated  ACTIVITY TOLERANCE: Activity tolerance: TBD and assessed further with functional activities   FUNCTIONAL OUTCOME MEASURES: FOTO: TBD   UPPER EXTREMITY ROM:    Active ROM Right eval Left eval Right 07/12/2022 Left 07/12/2022  Shoulder flexion 118 85 120 117  Shoulder abduction 128 124 132 127  Shoulder adduction      Shoulder extension      Shoulder internal rotation [facility] WFL  Shoulder external rotation [facility] WFL  Elbow flexion      Elbow extension      Wrist flexion 55 76 59 75  Wrist extension 57 75 61 71  Wrist ulnar deviation      Wrist radial deviation      Wrist pronation [facility] WFL WFL  Wrist supination 63 75 66 81  (Blank rows = not tested)  UPPER EXTREMITY MMT:     MMT Right eval Left eval Right 07/12/2022 Left 07/12/2022  Shoulder flexion 4 4 4+ 4+  Shoulder abduction 4+ 4+ 4+ 4+  Shoulder adduction      Shoulder extension      Shoulder internal rotation      Shoulder external rotation      Middle trapezius      Lower trapezius      Elbow flexion 4+ 4+ 4+ 4+  Elbow extension 4+ 4+ 4+ 4+  Wrist flexion 4+ 4+ 4+ 4+  Wrist extension 4+ 4+ 4+ 4+  Wrist ulnar deviation      Wrist radial deviation      Wrist pronation      Wrist supination      (Blank rows = not tested)  HAND FUNCTION: Grip strength: Right: 16 lbs; Left: 38 lbs, Lateral pinch: Right: 10 lbs, Left: 14 lbs, and 3 point pinch: Right: 9 lbs, Left: 11 lbs 07/12/2022: Grip strength: Right: 16 lbs;  Left: 40 lbs, Lateral pinch: Right: 10 lbs, Left: 14 lbs, and 3 point pinch: Right: 9 lbs, Left: 15 lbs COORDINATION: 9 Hole Peg test: Right: 38 sec; Left: 40 sec 07/12/2022: 9 Hole Peg test: Right: 35 sec; Left: 36 sec SENSATION: WFL  EDEMA: None  MUSCLE TONE: R/L normal  COGNITION: Overall cognitive status: Within functional limits for tasks assessed  VISION: Subjective report: wears glasses all the time    PRAXIS: Impaired: Motor planning  OBSERVATIONS:  R SF PIP flexion contracture 105* from 5-6 years ago from a fall  TODAY'S TREATMENT:                                                                                                                              DATE: 07/26/22:  Therapeutic Exercise:  Pt. worked on [employer] with use of a hand gripper set at 6.6# on the R x 2 trials and 23.4# on the left for trial 1 and 17.9# for trial 2 using the hand grip strengthener to remove jumbo pegs from pegboard. Intermittent min vc and tactile cues for set up of hand gripper in hand to maximize closure with each gripping rep. Pt. Worked on using lateral pinch to place yellow, red, green, blue, and black onto horizontal dowel with the L hand x 1 trial. Pt. Worked on using lateral pinch with the R hand to place yellow, red, green and blue resistive clips onto horizontal dowel x 1 trial. Pt. Repeated a 2nd trial for both L and R hand  using lateral pinch however with the dowel in a vertical position.   PATIENT EDUCATION: Education details: Potential benefit of wrist weight to minimize UE tremor Person educated: Patient Education method: Explanation and Verbal cues, demo Education comprehension: verbalized understanding, demonstrated understanding  HOME EXERCISE PROGRAM: Theraputty   GOALS: Goals reviewed with patient? Yes  SHORT TERM GOALS: Target date: 07/07/22 (6 weeks)  Pt will be indep to perform HEP for improving bilat hand strength and coordination skills. Baseline:  Eval: not yet initiated 07/12/2022: Completing theraputty exercises independent at home Goal status: Met/Achieved  LONG TERM GOALS: Target date: 08/18/22 (12 weeks)  Pt will increase FOTO score to (TBD) or better to indicate improvement in self perceived functional use of the R arm with daily tasks. Baseline: Eval: TBD 07/12/2022: TBD Goal status: INITIAL  2.  Pt will increase R grip strength by 5 or more lbs to more easily hold and stabilize ADL supplies in the R dominant hand. Baseline: R grip 16 lbs; limited from PD but also by 5th digit PIP flexion contracture, as this digit can not engage in gripping (L 38 lbs) 07/12/2022: Grip strength: Right: 16 lbs; limited from PD but also by 5th digit PIP flexion contracture, as this digit still can not engage in gripping (L 40 lbs) Goal status: Ongoing  3.  Pt will improve R hand FMC/dexterity skills to be able to sign his name on medical/legal documents with 75% legibility, using adapted writing aids as needed. Baseline: Eval: <25% legible and pt verbalizes embarrassment when attempting to sign his name on forms 07/12/2022: 75% legibility when writing his name in print, however reports he is still not satisfied with his signature looks. Goal status: Ongoing  4.  Pt will increase R FMC/GMC skills to engage the RUE into UB ADLs at least 50% of the time. Baseline: Eval: Pt uses the L non-dominant arm for self care tasks. 07/12/22: Pt. Reports that he has been trying to incorporate his R hand while  still using the L arm to complete all self care tasks.  Goal status: Ongoing  ASSESSMENT:  CLINICAL IMPRESSION:  Pt. Reports that they forgot to bring a soda bottle today but will next session to practice on. Pt. Tolerated 6.6# on the R x 2 trials and 23.4# on the left  x's 1 trials and ask to adjust to 17.9# for the 2nd trial using the hand grip strengthener to remove jumbo pegs from pegboard. Pt. Required frequent rest breaks throughout the second trial. Pt.  continues to present with decreased  grip on the R d/t R Small Finger flexion contracture preventing this digit from engaging in gripping on this hand.Pt. Was able to use lateral pinch on the R and L to place resistive clips onto horizontal/vertical dowel however continues to be unable to use black resistance for the R hand. Pt. continues to benefit from skilled OT for increasing bilateral hand strength, improve coordination, specifically working to increase engagement of the RUE during self care tasks.    PERFORMANCE DEFICITS: in functional skills including ADLs, IADLs, coordination, dexterity, ROM, strength, flexibility, Fine motor control, Gross motor control, mobility, balance, body mechanics, endurance, decreased knowledge of use of DME, and UE functional use, cognitive skills including emotional and memory, and psychosocial skills including coping strategies, environmental adaptation, habits, and routines and behaviors.   IMPAIRMENTS: are limiting patient from ADLs, IADLs, and leisure.   CO-MORBIDITIES: has co-morbidities such as anxiety, depression  that affects occupational performance. Patient will benefit from skilled OT to address above impairments and improve overall function.  MODIFICATION OR ASSISTANCE TO COMPLETE EVALUATION: No modification of tasks or assist necessary to complete an evaluation.  OT OCCUPATIONAL PROFILE AND HISTORY: Problem focused assessment: Including review of records relating to presenting problem.  CLINICAL DECISION MAKING: Moderate - several treatment options, min-mod task modification necessary  REHAB POTENTIAL: Good for goals  EVALUATION COMPLEXITY: Moderate    PLAN:  OT FREQUENCY: 2x/week  OT DURATION: 12 weeks  PLANNED INTERVENTIONS: self care/ADL training, therapeutic exercise, therapeutic activity, neuromuscular re-education, manual therapy, passive range of motion, balance training, functional mobility training, moist heat, cryotherapy,  patient/family education, cognitive remediation/compensation, psychosocial skills training, energy conservation, coping strategies training, and DME and/or AE instructions RECOMMENDED OTHER SERVICES: None at this time  CONSULTED AND AGREED WITH PLAN OF CARE: Patient  PLAN FOR NEXT SESSION: initiate HEP, complete Sudie Grumbling, OTS 07/26/2022, 4:31 PM  This entire session was performed under the direct supervision and direction of a licensed therapist. I have personally read, edited, and approve of the note as written.   Olegario Messier, MS, OTR/L  07/27/2022

## 2022-07-28 ENCOUNTER — Ambulatory Visit: Payer: Medicare Other | Admitting: Physical Therapy

## 2022-07-28 ENCOUNTER — Ambulatory Visit: Payer: Medicare Other | Admitting: Occupational Therapy

## 2022-07-28 DIAGNOSIS — R278 Other lack of coordination: Secondary | ICD-10-CM

## 2022-07-28 DIAGNOSIS — R269 Unspecified abnormalities of gait and mobility: Secondary | ICD-10-CM

## 2022-07-28 DIAGNOSIS — M6281 Muscle weakness (generalized): Secondary | ICD-10-CM

## 2022-07-28 DIAGNOSIS — R262 Difficulty in walking, not elsewhere classified: Secondary | ICD-10-CM | POA: Diagnosis not present

## 2022-07-28 DIAGNOSIS — R2689 Other abnormalities of gait and mobility: Secondary | ICD-10-CM

## 2022-07-28 DIAGNOSIS — R2681 Unsteadiness on feet: Secondary | ICD-10-CM

## 2022-07-28 NOTE — Therapy (Signed)
Outpatient Physical Therapy Treatment    Patient Details  Name: Charles Stanton MRN: [id number] Date of Birth: [birth year] Referring Provider (PT): Dr. Cristopher Peru   Encounter Date: 07/28/2022  END OF SESSION:    PT End of Session - 07/28/22 1311     Visit Number 41    Number of Visits 62    Date for PT Re-Evaluation 10/11/22    Authorization Type Medicare A & B primary; federal employee secondary    Authorization Time Period -10/11/22    Progress Note Due on Visit 50    PT Start Time 1315    PT Stop Time 1558    PT Time Calculation (min) 163 min    Equipment Utilized During Treatment Gait belt    Activity Tolerance Patient tolerated treatment well;No increased pain    Behavior During Therapy [facility] for tasks assessed/performed                      Past Medical History:  Diagnosis Date   Abnormal liver function    Anemia    Anxiety    Atrial fibrillation (HCC)    Cancer (HCC) 2017   skin   Colonic mass    Depression    Dizziness    Dry cough    History of colon polyps    Hypercholesteremia    Hyperlipemia    Hypertension    Palpitations    Parkinson's disease    Parkinson's disease    PVD (peripheral vascular disease) (HCC)    Tonsillitis 03/25/2013   Tremors of nervous system     Past Surgical History:  Procedure Laterality Date   BACK SURGERY     Kyphoplasty   March 2018   COLONOSCOPY     COLONOSCOPY WITH PROPOFOL N/A 12/12/2017   Procedure: COLONOSCOPY WITH PROPOFOL;  Surgeon: Wyline Mood, MD;  Location: [facility] ENDOSCOPY;  Service: Gastroenterology;  Laterality: N/A;   ESOPHAGOGASTRODUODENOSCOPY N/A 12/11/2017   Procedure: ESOPHAGOGASTRODUODENOSCOPY (EGD);  Surgeon: Wyline Mood, MD;  Location: [facility] ENDOSCOPY;  Service: Gastroenterology;  Laterality: N/A;   ESOPHAGOGASTRODUODENOSCOPY N/A 10/08/2021   Procedure: ESOPHAGOGASTRODUODENOSCOPY (EGD);  Surgeon: Regis Bill, MD;  Location: [facility] ENDOSCOPY;  Service: Endoscopy;  Laterality: N/A;    ESOPHAGOGASTRODUODENOSCOPY (EGD) WITH PROPOFOL N/A 09/21/2016   Procedure: ESOPHAGOGASTRODUODENOSCOPY (EGD) WITH PROPOFOL;  Surgeon: Wyline Mood, MD;  Location: [facility] ENDOSCOPY;  Service: Gastroenterology;  Laterality: N/A;   ESOPHAGOGASTRODUODENOSCOPY (EGD) WITH PROPOFOL N/A 11/07/2016   Procedure: ESOPHAGOGASTRODUODENOSCOPY (EGD) WITH PROPOFOL;  Surgeon: Wyline Mood, MD;  Location: [facility] ENDOSCOPY;  Service: Gastroenterology;  Laterality: N/A;   ESOPHAGOGASTRODUODENOSCOPY (EGD) WITH PROPOFOL N/A 10/15/2019   Procedure: ESOPHAGOGASTRODUODENOSCOPY (EGD) WITH PROPOFOL;  Surgeon: Regis Bill, MD;  Location: [facility] ENDOSCOPY;  Service: Endoscopy;  Laterality: N/A;   ESOPHAGOGASTRODUODENOSCOPY (EGD) WITH PROPOFOL N/A 10/28/2019   Procedure: ESOPHAGOGASTRODUODENOSCOPY (EGD) WITH PROPOFOL;  Surgeon: Regis Bill, MD;  Location: [facility] ENDOSCOPY;  Service: Endoscopy;  Laterality: N/A;   ESOPHAGOGASTRODUODENOSCOPY (EGD) WITH PROPOFOL N/A 12/02/2019   Procedure: ESOPHAGOGASTRODUODENOSCOPY (EGD) WITH PROPOFOL;  Surgeon: Regis Bill, MD;  Location: [facility] ENDOSCOPY;  Service: Endoscopy;  Laterality: N/A;   ESOPHAGOGASTRODUODENOSCOPY (EGD) WITH PROPOFOL N/A 02/18/2020   Procedure: ESOPHAGOGASTRODUODENOSCOPY (EGD) WITH PROPOFOL;  Surgeon: Regis Bill, MD;  Location: [facility] ENDOSCOPY;  Service: Endoscopy;  Laterality: N/A;   ESOPHAGOGASTRODUODENOSCOPY (EGD) WITH PROPOFOL N/A 08/02/2021   Procedure: ESOPHAGOGASTRODUODENOSCOPY (EGD) WITH PROPOFOL;  Surgeon: Regis Bill, MD;  Location: [facility] ENDOSCOPY;  Service: Endoscopy;  Laterality: N/A;  REQUEST  AM   ESOPHAGOGASTRODUODENOSCOPY (EGD) WITH PROPOFOL N/A 10/05/2021   Procedure: ESOPHAGOGASTRODUODENOSCOPY (EGD) WITH PROPOFOL;  Surgeon: Regis Bill, MD;  Location: [facility] ENDOSCOPY;  Service: Endoscopy;  Laterality: N/A;   FRACTURE SURGERY Right 05/28/2019   distal radius fracture   HERNIA REPAIR     left inguinal hernia repair as an  infant   KYPHOPLASTY N/A 02/11/2016   Procedure: KYPHOPLASTY  L2,T9;  Surgeon: Kennedy Bucker, MD;  Location: [facility] ORS;  Service: Orthopedics;  Laterality: N/A;   KYPHOPLASTY N/A 03/15/2016   Procedure: KYPHOPLASTY L3;  Surgeon: Kennedy Bucker, MD;  Location: [facility] ORS;  Service: Orthopedics;  Laterality: N/A;   OPEN REDUCTION INTERNAL FIXATION (ORIF) DISTAL RADIAL FRACTURE Right 05/28/2019   Procedure: OPEN REDUCTION INTERNAL FIXATION (ORIF) DISTAL RADIAL FRACTURE;  Surgeon: Kennedy Bucker, MD;  Location: [facility] ORS;  Service: Orthopedics;  Laterality: Right;   skin cancer removed     TONSILLECTOMY       Subjective Assessment -      Subjective  Pt reports doing well today. Pt denies any recent falls/stumbles since prior session. Pt denies any updates to medications or medical appointment since prior session. Pt reports good compliance with HEP when time permits.      Pertinent History Charles Stanton is a 66yoM who was prescribed with Parkinson's Disease ~ 10 years.  Pt is followed by neurology, reports good reponse to medications which help control his tremors somewhat. Pt uses a SPC for AMB, also uses a walker for longer distance walking. Pt goes into community almost daily (they like to eat out) which includes lots of SPC [place] eand navigation of 4 stairs at home.    Currently in Pain? No/denies             TREATMENT: Unless otherwise stated, CGA was provided and gait belt donned in order to ensure pt safety  NMR  PWR! Seated up, twist, rock and step for warm up of postural and truncal muscles. -pt provided with PWR! In seated handout and tasked with completion, still requires cues for PWR! Rock to get adequate weight shift.   PWR! Up works on postural strengthening and antigravity extension, PRW! Rock working on [employer] shifting, PRW! Twist targeting trunk rotation and PRW! Step targeting transition movements. PWR! Moves target bradykinesia, rigidity, and dyskinesia through targeted  functional movements that address four core movement difficulties for people with Parkinson's disease.   Ambulation with SPC weaving between obstacles with metronome in cadence with cane striking x 3 rounds of 2 min duration. Initial difficulty with keeping pace but improvement with practice and reps   Activity Description: step reaction practice  Activity Setting:  random Number of Pods:  4 Cycles/Sets:  4 Duration (Time or Hit Count):  20  Patient Stats  Reaction Time:  1.4, 1.3, 1.2,1.3 sec respectively   Activity Description: blase reaction times with UE support  Activity Setting:  random Number of Pods:  4 Cycles/Sets:  2 Duration (Time or Hit Count):  20  Patient Stats  Reaction Time:  .9, .8 respectively  -working on reaction time when using rollator.      PT Education -     Education Details Pt educated throughout session about proper posture and technique with exercises. Improved exercise technique, movement at target joints, use of target muscles after min to mod verbal, visual, tactile cues.      Person(s) Educated Patient    Methods Explanation;Demonstration ; handout   Comprehension Verbalized understanding;Returned demonstration;Need further instruction  PT Short Term Goals -       PT SHORT TERM GOAL #1   Title Pt to report regular performance of exercises prescribed for home and a sense of improvements in strength and balance AEB them not being as challenging anymore.    Baseline Will issue on visit 2    Time 4    Period Weeks    Status met   Target Date 03/15/22      PT SHORT TERM GOAL #2   Title Pt to demonstrate improved power AEB improved 5xSTS hands free in <18sec.    Baseline 03/01/22: 22sec hands free, author provideing foot block bilat 03/31/22: 14.82sec 4/23:17.3 sec   Time 4    Period Weeks    Status Met    Target Date 03/29/22               PT Long Term Goals - Target goal date for all remaining long term goals is  07/19/2022        PT LONG TERM GOAL #1   Title Pt to improve score on FOTO survey to 57 to indicate reduced difficulty with basic mobility required for ADL performance.    Baseline 03/01/22:49 04/29/22:56%. 06/16/22: 60   Time 8    Period Weeks    Status Met          PT LONG TERM GOAL #2   Title Pt to improve tolerance to overground AMB c SPC AEB performance >862ft.    Baseline 2/27: fatigued after 483ft lap performed in four minutes ten seconds. 03/31/22: 790ft with SPC no rest break 04/26/22: 794 ft 06/16/22: 903 ft with SPC, no rest.   Time 8    Period Weeks    Status Met         PT LONG TERM GOAL #3   Title Pt to demonstrate improved leg power AEB 5xSTS from chair <14sec hands free and without need for minGuard assist for safety and without need for foot block.    Baseline 03/01/22: 22sec hands free, feet blocked, minGuard assist  03/31/22: 14.82sec  4/23:17.3 sec 06/16/22: 13.87 sec 7/16: 11.55 sec   Time 8     Period Weeks    Status MET         PT LONG TERM GOAL #4   Title Pt to demonstrate improvement in balance performance AEB >6 improvement BERG Balance for balance assessment.    Baseline 41 on 03/03/22. 3/38/24: 42 4/23: 45 06/16/22: 48   Time 8    Period Weeks    Status Met          5.  Pt will improve distance with LRAD to 900 feet or greater in order to indicate further progress to community ambulation distances Baseline: 794 ft with SPC , 06/16/22: 903 ft with SPC. 07/19/22: 905 feet Goal status: Met  6.  Patient will improve modified DGI (with use of cane) score > 4 points or greater in order to indicate improved balance and decreased risk of falls.  Baseline: 6/18: 11 7/16: 14  Goal status: IN PROGRESS  7. Patient will be able to withstand various light perturbations while preforming ambulation with LRAD utilizing safe balance strategies in order to simulate safe and confident walking in crowded places. Baseline: Pt currently does not feel safe preforming  task, could walk through cafeteria to get baseline. 7/23: 70% confidence with activity, mild LOB with perturbations.  Goal Status: IN PROGRESS  8. Pt will be compliant and independent  with Parkinson's based HEP by reciting and demonstrating full PWR! Move sets in seated and or standing with no cueing from therapist. Baseline: Pt currently needs demonstration or verbal cueing for proper completion  7/23: cues required for proper completion with PWR! Up, rock, and twist in seated position  Goal Status: IN PROGRESS     Plan     Clinical Impression Statement Continued with current plan of care as laid out in evaluation and recent prior sessions. Pt remains motivated to advance progress toward goals in order to maximize independence and safety at home. Pt requires high level assistance and cuing for completion of exercises in order to provide adequate level of stimulation and perturbation. Pt shows good progress with reaction times using blaze pods and plan to continue to progress this intervention in the future. Pt continues to demonstrate progress toward goals AEB progression of some interventions this date either in volume or intensity.     Personal Factors and Comorbidities Time since onset of injury/illness/exacerbation;Past/Current Experience    Examination-Activity Limitations Locomotion Level;Transfers;Bed Mobility;Reach Overhead;Hygiene/Grooming;Dressing;Toileting;Stairs;Bend    Stability/Clinical Decision Making Evolving/Moderate complexity    Clinical Decision Making Moderate    Rehab Potential Good    PT Frequency 2x / week    PT Duration 8 weeks    PT Treatment/Interventions ADLs/Self Care Home Management;Moist Heat;Gait training;Stair training;Functional mobility training;Therapeutic activities;Therapeutic exercise;Balance training;Neuromuscular re-education;Patient/family education    PT Next Visit Plan .  STS PWR! Up with eventual addition of PWR! Step. Work on weight shift cues  with STS. Dynamic balance/gait training as indicated.    PT Home Exercise Plan 03/01/22: discussed finding and elevated sit surface for future STS exercises (something ~18-19 inches high)    Consulted and Agree with Plan of Care Patient             Patient will benefit from skilled therapeutic intervention in order to improve the following deficits and impairments:  balance, strength, coordination, gait, transfers. Posture. ROM   Visit Diagnosis: Abnormality of gait and mobility  Difficulty in walking, not elsewhere classified  Muscle weakness (generalized)  Other abnormalities of gait and mobility  Unsteadiness on feet    Problem List Patient Active Problem List   Diagnosis Date Noted   Acute respiratory failure due to COVID-19 (HCC) 05/28/2019   Alcohol dependence with uncomplicated withdrawal (HCC) 01/23/2018   Alcohol withdrawal (HCC) 01/23/2018   GIB (gastrointestinal bleeding) 12/09/2017   A-fib (HCC) 12/09/2017   Esophageal obstruction 10/10/2016   Esophageal ulceration 10/10/2016   GI bleed 09/20/2016   Low vitamin B12 level 03/18/2016   S/P kyphoplasty 03/18/2016   Basal cell carcinoma 02/08/2016   Atrial fibrillation (HCC) 01/27/2016   Colitis 01/27/2016   HLD (hyperlipidemia) 01/27/2016   Anxiety 01/27/2016   Hypokalemia 01/27/2016   Major depressive disorder, recurrent episode, moderate (HCC) 04/14/2015   Anxiety, generalized 12/06/2013   Parkinson disease 08/07/2013   Dysphagia 05/12/2013   Esophageal mass 05/12/2013   GERD (gastroesophageal reflux disease) 05/12/2013   Vomiting 05/12/2013   Open bite of lower leg 05/10/2013   Dog bite of lower leg 05/10/2013   Abnormal finding on liver function 03/25/2013   Benign neoplasm 03/25/2013   Dizziness 03/25/2013   Type 2 diabetes mellitus (HCC) 03/25/2013   BP (high blood pressure) 03/25/2013   Awareness of heartbeats 03/25/2013   Pure hypercholesterolemia 03/25/2013   Infective tonsillitis  03/25/2013   Adenomatous polyp 03/25/2013      Norman Herrlich PT ,DPT Physical Therapist- [place]  [place]  [facility]

## 2022-07-28 NOTE — Therapy (Addendum)
OUTPATIENT OCCUPATIONAL THERAPY NEURO TREATMENT NOTE  Patient Name: Charles Stanton MRN: [id number] DOB:[birth year], 67 y.o., male Today's Date: 07/28/2022  PCP: Dr. Barbette Reichmann REFERRING PROVIDER: Dr. Cristopher Peru  END OF SESSION:  OT End of Session - 07/28/22 1401     Visit Number 15    Number of Visits 24    Date for OT Re-Evaluation 08/18/22    OT Start Time 1400    OT Stop Time 1445    OT Time Calculation (min) 45 min    Equipment Utilized During Treatment Cane    Activity Tolerance Patient tolerated treatment well    Behavior During Therapy [facility] for tasks assessed/performed             Past Medical History:  Diagnosis Date   Abnormal liver function    Anemia    Anxiety    Atrial fibrillation (HCC)    Cancer (HCC) 2017   skin   Colonic mass    Depression    Dizziness    Dry cough    History of colon polyps    Hypercholesteremia    Hyperlipemia    Hypertension    Palpitations    Parkinson's disease    Parkinson's disease    PVD (peripheral vascular disease) (HCC)    Tonsillitis 03/25/2013   Tremors of nervous system    Past Surgical History:  Procedure Laterality Date   BACK SURGERY     Kyphoplasty   March 2018   COLONOSCOPY     COLONOSCOPY WITH PROPOFOL N/A 12/12/2017   Procedure: COLONOSCOPY WITH PROPOFOL;  Surgeon: Wyline Mood, MD;  Location: [facility] ENDOSCOPY;  Service: Gastroenterology;  Laterality: N/A;   ESOPHAGOGASTRODUODENOSCOPY N/A 12/11/2017   Procedure: ESOPHAGOGASTRODUODENOSCOPY (EGD);  Surgeon: Wyline Mood, MD;  Location: [facility] ENDOSCOPY;  Service: Gastroenterology;  Laterality: N/A;   ESOPHAGOGASTRODUODENOSCOPY N/A 10/08/2021   Procedure: ESOPHAGOGASTRODUODENOSCOPY (EGD);  Surgeon: Regis Bill, MD;  Location: [facility] ENDOSCOPY;  Service: Endoscopy;  Laterality: N/A;   ESOPHAGOGASTRODUODENOSCOPY (EGD) WITH PROPOFOL N/A 09/21/2016   Procedure: ESOPHAGOGASTRODUODENOSCOPY (EGD) WITH PROPOFOL;  Surgeon: Wyline Mood, MD;  Location: [facility]  ENDOSCOPY;  Service: Gastroenterology;  Laterality: N/A;   ESOPHAGOGASTRODUODENOSCOPY (EGD) WITH PROPOFOL N/A 11/07/2016   Procedure: ESOPHAGOGASTRODUODENOSCOPY (EGD) WITH PROPOFOL;  Surgeon: Wyline Mood, MD;  Location: [facility] ENDOSCOPY;  Service: Gastroenterology;  Laterality: N/A;   ESOPHAGOGASTRODUODENOSCOPY (EGD) WITH PROPOFOL N/A 10/15/2019   Procedure: ESOPHAGOGASTRODUODENOSCOPY (EGD) WITH PROPOFOL;  Surgeon: Regis Bill, MD;  Location: [facility] ENDOSCOPY;  Service: Endoscopy;  Laterality: N/A;   ESOPHAGOGASTRODUODENOSCOPY (EGD) WITH PROPOFOL N/A 10/28/2019   Procedure: ESOPHAGOGASTRODUODENOSCOPY (EGD) WITH PROPOFOL;  Surgeon: Regis Bill, MD;  Location: [facility] ENDOSCOPY;  Service: Endoscopy;  Laterality: N/A;   ESOPHAGOGASTRODUODENOSCOPY (EGD) WITH PROPOFOL N/A 12/02/2019   Procedure: ESOPHAGOGASTRODUODENOSCOPY (EGD) WITH PROPOFOL;  Surgeon: Regis Bill, MD;  Location: [facility] ENDOSCOPY;  Service: Endoscopy;  Laterality: N/A;   ESOPHAGOGASTRODUODENOSCOPY (EGD) WITH PROPOFOL N/A 02/18/2020   Procedure: ESOPHAGOGASTRODUODENOSCOPY (EGD) WITH PROPOFOL;  Surgeon: Regis Bill, MD;  Location: [facility] ENDOSCOPY;  Service: Endoscopy;  Laterality: N/A;   ESOPHAGOGASTRODUODENOSCOPY (EGD) WITH PROPOFOL N/A 08/02/2021   Procedure: ESOPHAGOGASTRODUODENOSCOPY (EGD) WITH PROPOFOL;  Surgeon: Regis Bill, MD;  Location: [facility] ENDOSCOPY;  Service: Endoscopy;  Laterality: N/A;  REQUEST AM   ESOPHAGOGASTRODUODENOSCOPY (EGD) WITH PROPOFOL N/A 10/05/2021   Procedure: ESOPHAGOGASTRODUODENOSCOPY (EGD) WITH PROPOFOL;  Surgeon: Regis Bill, MD;  Location: [facility] ENDOSCOPY;  Service: Endoscopy;  Laterality: N/A;   FRACTURE SURGERY Right 05/28/2019   distal radius fracture  HERNIA REPAIR     left inguinal hernia repair as an infant   KYPHOPLASTY N/A 02/11/2016   Procedure: KYPHOPLASTY  L2,T9;  Surgeon: Kennedy Bucker, MD;  Location: [facility] ORS;  Service: Orthopedics;  Laterality: N/A;    KYPHOPLASTY N/A 03/15/2016   Procedure: KYPHOPLASTY L3;  Surgeon: Kennedy Bucker, MD;  Location: [facility] ORS;  Service: Orthopedics;  Laterality: N/A;   OPEN REDUCTION INTERNAL FIXATION (ORIF) DISTAL RADIAL FRACTURE Right 05/28/2019   Procedure: OPEN REDUCTION INTERNAL FIXATION (ORIF) DISTAL RADIAL FRACTURE;  Surgeon: Kennedy Bucker, MD;  Location: [facility] ORS;  Service: Orthopedics;  Laterality: Right;   skin cancer removed     TONSILLECTOMY     Patient Active Problem List   Diagnosis Date Noted   Acute respiratory failure due to COVID-19 (HCC) 05/28/2019   Alcohol dependence with uncomplicated withdrawal (HCC) 01/23/2018   Alcohol withdrawal (HCC) 01/23/2018   GIB (gastrointestinal bleeding) 12/09/2017   A-fib (HCC) 12/09/2017   Esophageal obstruction 10/10/2016   Esophageal ulceration 10/10/2016   GI bleed 09/20/2016   Low vitamin B12 level 03/18/2016   S/P kyphoplasty 03/18/2016   Basal cell carcinoma 02/08/2016   Atrial fibrillation (HCC) 01/27/2016   Colitis 01/27/2016   HLD (hyperlipidemia) 01/27/2016   Anxiety 01/27/2016   Hypokalemia 01/27/2016   Major depressive disorder, recurrent episode, moderate (HCC) 04/14/2015   Anxiety, generalized 12/06/2013   Parkinson disease 08/07/2013   Dysphagia 05/12/2013   Esophageal mass 05/12/2013   GERD (gastroesophageal reflux disease) 05/12/2013   Vomiting 05/12/2013   Open bite of lower leg 05/10/2013   Dog bite of lower leg 05/10/2013   Abnormal finding on liver function 03/25/2013   Benign neoplasm 03/25/2013   Dizziness 03/25/2013   Type 2 diabetes mellitus (HCC) 03/25/2013   BP (high blood pressure) 03/25/2013   Awareness of heartbeats 03/25/2013   Pure hypercholesterolemia 03/25/2013   Infective tonsillitis 03/25/2013   Adenomatous polyp 03/25/2013   ONSET DATE: 10 years   REFERRING DIAG: Parkinson's Disease  THERAPY DIAG:  Muscle weakness (generalized)  Other lack of coordination  Rationale for Evaluation and Treatment:  Rehabilitation  SUBJECTIVE:  SUBJECTIVE STATEMENT: Pt reports he is doing well today. Pt. Reports that he had a good PT session prior to OT session.  Pt accompanied by: self; spouse present in lobby, but did not follow for eval  PERTINENT HISTORY: Pt has been working with outpatient PT here at [facility] since end of Feb 2024.  OT ordered d/t worsening PD symptoms contributing to functional decline with ADLs.   PRECAUTIONS: Fall  WEIGHT BEARING RESTRICTIONS: No  PAIN:  Are you having pain? No pain reported today.  FALLS: Has patient fallen in last 6 months? Yes. Number of falls 1  LIVING ENVIRONMENT: Lives with: lives with their spouse Lives in: split level but pt remains on main level to avoid steps Stairs: Yes: Internal: 2 steps; can reach both Has following equipment at home: Quad cane small base, Walker - 4 wheeled, bed side commode, Grab bars, and pull down seat for shower, hand held shower hose,   PLOF: Needs assistance with ADLs; pt reports that spouse assists but is consistent to always let pt try things first before she automatically helps.    PATIENT GOALS: To be more indep  OBJECTIVE:  HAND DOMINANCE: Right (most affected side from PD)  ADLs: Overall ADLs: spouse is primary caregiver Transfers/ambulation related to ADLs: uses RW Eating: spouse cuts food; pt reports having to use L non-dominant hand to eat for the last 5-6  yrs Grooming: set up; assist to squeeze toothpaste and denture paste; uses L non-dominant hand UB Dressing: set up; assist with clothing fasteners LB Dressing: Min A; assist with socks, dons slip on shoes with set up (has tried a sockaid but was unsuccessful) Toileting: modified indep Bathing: Set up/min A; spouse squeezes shampoo into pt's hands, spouse helps to towel dry  Tub Shower transfers: distant supv Equipment:  see above   IADLs: Shopping: pt can accompany spouse by pushing a shopping cart.  Light housekeeping: pt can drag trash to bin and  can roll the bin to the road and back Meal Prep: spouse manages  Community mobility: spouse drives; uses rollator for longer distances  Medication management: spouse manages pills using weekly pill organizer.   Financial management: spouse manages  Handwriting: Mild micrographia and <25% legible  MOBILITY STATUS: Hx of falls  POSTURE COMMENTS:  rounded shoulders and forward head, R shoulder elevated  ACTIVITY TOLERANCE: Activity tolerance: TBD and assessed further with functional activities   FUNCTIONAL OUTCOME MEASURES: FOTO: TBD   UPPER EXTREMITY ROM:    Active ROM Right eval Left eval Right 07/12/2022 Left 07/12/2022  Shoulder flexion 118 85 120 117  Shoulder abduction 128 124 132 127  Shoulder adduction      Shoulder extension      Shoulder internal rotation [facility] WFL  Shoulder external rotation [facility] WFL  Elbow flexion      Elbow extension      Wrist flexion 55 76 59 75  Wrist extension 57 75 61 71  Wrist ulnar deviation      Wrist radial deviation      Wrist pronation [facility] WFL WFL  Wrist supination 63 75 66 81  (Blank rows = not tested)  UPPER EXTREMITY MMT:     MMT Right eval Left eval Right 07/12/2022 Left 07/12/2022  Shoulder flexion 4 4 4+ 4+  Shoulder abduction 4+ 4+ 4+ 4+  Shoulder adduction      Shoulder extension      Shoulder internal rotation      Shoulder external rotation      Middle trapezius      Lower trapezius      Elbow flexion 4+ 4+ 4+ 4+  Elbow extension 4+ 4+ 4+ 4+  Wrist flexion 4+ 4+ 4+ 4+  Wrist extension 4+ 4+ 4+ 4+  Wrist ulnar deviation      Wrist radial deviation      Wrist pronation      Wrist supination      (Blank rows = not tested)  HAND FUNCTION: Grip strength: Right: 16 lbs; Left: 38 lbs, Lateral pinch: Right: 10 lbs, Left: 14 lbs, and 3 point pinch: Right: 9 lbs, Left: 11 lbs 07/12/2022: Grip strength: Right: 16 lbs; Left: 40 lbs, Lateral pinch: Right: 10 lbs, Left: 14 lbs, and 3 point pinch: Right: 9  lbs, Left: 15 lbs COORDINATION: 9 Hole Peg test: Right: 38 sec; Left: 40 sec 07/12/2022: 9 Hole Peg test: Right: 35 sec; Left: 36 sec SENSATION: WFL  EDEMA: None  MUSCLE TONE: R/L normal  COGNITION: Overall cognitive status: Within functional limits for tasks assessed  VISION: Subjective report: wears glasses all the time    PRAXIS: Impaired: Motor planning  OBSERVATIONS:  R SF PIP flexion contracture 105* from 5-6 years ago from a fall  TODAY'S TREATMENT:  DATE: 07/28/22:  Therapeutic Exercise:  Pt. worked on BUE hand strengthening with use of a hand gripper set at 11.2# for trial 1 and adjusted to 6.6# for trial 2 using the R hand. Hand gripper set at 23.4# on the left for trial 1 and 17.9# for trial 2. Hand grip strengthener was used to remove jumbo pegs from pegboard. Intermittent min vc and tactile cues for set up of hand gripper in hand to maximize closure with each gripping rep. Pt. performed resistive EZ Board exercises for forearm supination/pronation using gross grasp, and lateral pinch (key) grasp.   Self Care/Home Management: Pt. Worked on writing his name in print and cursive using the Pen Again. Pt. Wrote his first and last name in print and cursive x 3 trials.   PATIENT EDUCATION: Education details: Chartered certified accountant utensils to increase legibility of signature Person educated: Patient Education method: Explanation and Verbal cues, demo Education comprehension: verbalized understanding, demonstrated understanding  HOME EXERCISE PROGRAM: Theraputty   GOALS: Goals reviewed with patient? Yes  SHORT TERM GOALS: Target date: 07/07/22 (6 weeks)  Pt will be indep to perform HEP for improving bilat hand strength and coordination skills. Baseline: Eval: not yet initiated 07/12/2022: Completing theraputty exercises independent at home Goal status:  Met/Achieved  LONG TERM GOALS: Target date: 08/18/22 (12 weeks)  Pt will increase FOTO score to (TBD) or better to indicate improvement in self perceived functional use of the R arm with daily tasks. Baseline: Eval: TBD 07/12/2022: TBD Goal status: INITIAL  2.  Pt will increase R grip strength by 5 or more lbs to more easily hold and stabilize ADL supplies in the R dominant hand. Baseline: R grip 16 lbs; limited from PD but also by 5th digit PIP flexion contracture, as this digit can not engage in gripping (L 38 lbs) 07/12/2022: Grip strength: Right: 16 lbs; limited from PD but also by 5th digit PIP flexion contracture, as this digit still can not engage in gripping (L 40 lbs) Goal status: Ongoing  3.  Pt will improve R hand FMC/dexterity skills to be able to sign his name on medical/legal documents with 75% legibility, using adapted writing aids as needed. Baseline: Eval: <25% legible and pt verbalizes embarrassment when attempting to sign his name on forms 07/12/2022: 75% legibility when writing his name in print, however reports he is still not satisfied with his signature looks. Goal status: Ongoing  4.  Pt will increase R FMC/GMC skills to engage the RUE into UB ADLs at least 50% of the time. Baseline: Eval: Pt uses the L non-dominant arm for self care tasks. 07/12/22: Pt. Reports that he has been trying to incorporate his R hand while still using the L arm to complete all self care tasks.  Goal status: Ongoing  ASSESSMENT:  CLINICAL IMPRESSION:  Pt. Tolerated 11.2# on the R x 1 trial however required the hand gripper to be adjusted to 6.6# on the R hand for trial 2. Pt. Tolerated  23.4# on the left  x's 1 trials and ask to adjust to 17.9# for the 2nd trial using the hand grip strengthener to remove jumbo pegs from pegboard. Pt. Required frequent rest breaks throughout the second trial. Pt. continues to present with decreased  grip on the R d/t R Small Finger flexion contracture preventing  this digit from engaging in gripping on this hand. Pt. Was able to do EZ Board exercises however required increased time and increased verbal cues to ensure key pinch was being used  throughout. Pt. Was able to write his first and last name in print and cursive using the Pen Again. Pt. Printed his first and last name with 100% legibility using the Pen Again. Pt. Wrote his first and last name in cursive with 75% legibility using the Pen Again. Pt. Reported he was very excited about how his signature looked today and ask to take the paper to show his wife. Pt. continues to benefit from skilled OT for increasing bilateral hand strength, improve coordination, specifically working to increase engagement of the RUE during self care tasks.    PERFORMANCE DEFICITS: in functional skills including ADLs, IADLs, coordination, dexterity, ROM, strength, flexibility, Fine motor control, Gross motor control, mobility, balance, body mechanics, endurance, decreased knowledge of use of DME, and UE functional use, cognitive skills including emotional and memory, and psychosocial skills including coping strategies, environmental adaptation, habits, and routines and behaviors.   IMPAIRMENTS: are limiting patient from ADLs, IADLs, and leisure.   CO-MORBIDITIES: has co-morbidities such as anxiety, depression  that affects occupational performance. Patient will benefit from skilled OT to address above impairments and improve overall function.  MODIFICATION OR ASSISTANCE TO COMPLETE EVALUATION: No modification of tasks or assist necessary to complete an evaluation.  OT OCCUPATIONAL PROFILE AND HISTORY: Problem focused assessment: Including review of records relating to presenting problem.  CLINICAL DECISION MAKING: Moderate - several treatment options, min-mod task modification necessary  REHAB POTENTIAL: Good for goals  EVALUATION COMPLEXITY: Moderate    PLAN:  OT FREQUENCY: 2x/week  OT DURATION: 12 weeks  PLANNED  INTERVENTIONS: self care/ADL training, therapeutic exercise, therapeutic activity, neuromuscular re-education, manual therapy, passive range of motion, balance training, functional mobility training, moist heat, cryotherapy, patient/family education, cognitive remediation/compensation, psychosocial skills training, energy conservation, coping strategies training, and DME and/or AE instructions RECOMMENDED OTHER SERVICES: None at this time  CONSULTED AND AGREED WITH PLAN OF CARE: Patient  PLAN FOR NEXT SESSION: initiate HEP, complete Sudie Grumbling, OTS 07/28/2022, 3:00 PM  This entire session was performed under the direct supervision and direction of a licensed therapist. I have personally read, edited, and approve of the note as written.   Olegario Messier, MS, OTR/L  07/28/2022

## 2022-08-02 ENCOUNTER — Ambulatory Visit: Payer: Medicare Other | Admitting: Physical Therapy

## 2022-08-02 ENCOUNTER — Ambulatory Visit: Payer: Medicare Other | Admitting: Occupational Therapy

## 2022-08-02 DIAGNOSIS — R278 Other lack of coordination: Secondary | ICD-10-CM

## 2022-08-02 DIAGNOSIS — M6281 Muscle weakness (generalized): Secondary | ICD-10-CM

## 2022-08-02 DIAGNOSIS — R269 Unspecified abnormalities of gait and mobility: Secondary | ICD-10-CM

## 2022-08-02 DIAGNOSIS — R2681 Unsteadiness on feet: Secondary | ICD-10-CM

## 2022-08-02 DIAGNOSIS — R262 Difficulty in walking, not elsewhere classified: Secondary | ICD-10-CM | POA: Diagnosis not present

## 2022-08-02 DIAGNOSIS — R2689 Other abnormalities of gait and mobility: Secondary | ICD-10-CM

## 2022-08-02 NOTE — Therapy (Addendum)
OUTPATIENT OCCUPATIONAL THERAPY NEURO TREATMENT NOTE  Patient Name: Charles Stanton MRN: [id number] DOB:27-Jun-1955, 67 y.o., male Today]'s Date: 08/02/2022  PCP: Dr. Barbette Reichmann REFERRING PROVIDER: Dr. Cristopher Peru  END OF SESSION:  OT End of Session - 08/02/22 1419     Visit Number 16 (P)     Number of Visits 24 (P)     Date for OT Re-Evaluation 08/18/22 (P)     OT Start Time 1400 (P)     OT Stop Time 1445 (P)     OT Time Calculation (min) 45 min (P)     Equipment Utilized During Treatment Cane (P)     Activity Tolerance Patient tolerated treatment well (P)     Behavior During Therapy WFL for tasks assessed/performed (P)              Past Medical History:  Diagnosis Date   Abnormal liver function    Anemia    Anxiety    Atrial fibrillation (HCC)    Cancer (HCC) 2017   skin   Colonic mass    Depression    Dizziness    Dry cough    History of colon polyps    Hypercholesteremia    Hyperlipemia    Hypertension    Palpitations    Parkinson's disease    Parkinson's disease    PVD (peripheral vascular disease) (HCC)    Tonsillitis 03/25/2013   Tremors of nervous system    Past Surgical History:  Procedure Laterality Date   BACK SURGERY     Kyphoplasty   March 2018   COLONOSCOPY     COLONOSCOPY WITH PROPOFOL N/A 12/12/2017   Procedure: COLONOSCOPY WITH PROPOFOL;  Surgeon: Wyline Mood, MD;  Location: [facility] ENDOSCOPY;  Service: Gastroenterology;  Laterality: N/A;   ESOPHAGOGASTRODUODENOSCOPY N/A 12/11/2017   Procedure: ESOPHAGOGASTRODUODENOSCOPY (EGD);  Surgeon: Wyline Mood, MD;  Location: [facility] ENDOSCOPY;  Service: Gastroenterology;  Laterality: N/A;   ESOPHAGOGASTRODUODENOSCOPY N/A 10/08/2021   Procedure: ESOPHAGOGASTRODUODENOSCOPY (EGD);  Surgeon: Regis Bill, MD;  Location: [facility] ENDOSCOPY;  Service: Endoscopy;  Laterality: N/A;   ESOPHAGOGASTRODUODENOSCOPY (EGD) WITH PROPOFOL N/A 09/21/2016   Procedure: ESOPHAGOGASTRODUODENOSCOPY (EGD) WITH PROPOFOL;   Surgeon: Wyline Mood, MD;  Location: [facility] ENDOSCOPY;  Service: Gastroenterology;  Laterality: N/A;   ESOPHAGOGASTRODUODENOSCOPY (EGD) WITH PROPOFOL N/A 11/07/2016   Procedure: ESOPHAGOGASTRODUODENOSCOPY (EGD) WITH PROPOFOL;  Surgeon: Wyline Mood, MD;  Location: [facility] ENDOSCOPY;  Service: Gastroenterology;  Laterality: N/A;   ESOPHAGOGASTRODUODENOSCOPY (EGD) WITH PROPOFOL N/A 10/15/2019   Procedure: ESOPHAGOGASTRODUODENOSCOPY (EGD) WITH PROPOFOL;  Surgeon: Regis Bill, MD;  Location: [facility] ENDOSCOPY;  Service: Endoscopy;  Laterality: N/A;   ESOPHAGOGASTRODUODENOSCOPY (EGD) WITH PROPOFOL N/A 10/28/2019   Procedure: ESOPHAGOGASTRODUODENOSCOPY (EGD) WITH PROPOFOL;  Surgeon: Regis Bill, MD;  Location: [facility] ENDOSCOPY;  Service: Endoscopy;  Laterality: N/A;   ESOPHAGOGASTRODUODENOSCOPY (EGD) WITH PROPOFOL N/A 12/02/2019   Procedure: ESOPHAGOGASTRODUODENOSCOPY (EGD) WITH PROPOFOL;  Surgeon: Regis Bill, MD;  Location: [facility] ENDOSCOPY;  Service: Endoscopy;  Laterality: N/A;   ESOPHAGOGASTRODUODENOSCOPY (EGD) WITH PROPOFOL N/A 02/18/2020   Procedure: ESOPHAGOGASTRODUODENOSCOPY (EGD) WITH PROPOFOL;  Surgeon: Regis Bill, MD;  Location: [facility] ENDOSCOPY;  Service: Endoscopy;  Laterality: N/A;   ESOPHAGOGASTRODUODENOSCOPY (EGD) WITH PROPOFOL N/A 08/02/2021   Procedure: ESOPHAGOGASTRODUODENOSCOPY (EGD) WITH PROPOFOL;  Surgeon: Regis Bill, MD;  Location: [facility] ENDOSCOPY;  Service: Endoscopy;  Laterality: N/A;  REQUEST AM   ESOPHAGOGASTRODUODENOSCOPY (EGD) WITH PROPOFOL N/A 10/05/2021   Procedure: ESOPHAGOGASTRODUODENOSCOPY (EGD) WITH PROPOFOL;  Surgeon: Regis Bill, MD;  Location: [facility] ENDOSCOPY;  Service: Endoscopy;  Laterality: N/A;   FRACTURE SURGERY Right 05/28/2019   distal radius fracture   HERNIA REPAIR     left inguinal hernia repair as an infant   KYPHOPLASTY N/A 02/11/2016   Procedure: KYPHOPLASTY  L2,T9;  Surgeon: Kennedy Bucker, MD;  Location: [facility] ORS;  Service:  Orthopedics;  Laterality: N/A;   KYPHOPLASTY N/A 03/15/2016   Procedure: KYPHOPLASTY L3;  Surgeon: Kennedy Bucker, MD;  Location: [facility] ORS;  Service: Orthopedics;  Laterality: N/A;   OPEN REDUCTION INTERNAL FIXATION (ORIF) DISTAL RADIAL FRACTURE Right 05/28/2019   Procedure: OPEN REDUCTION INTERNAL FIXATION (ORIF) DISTAL RADIAL FRACTURE;  Surgeon: Kennedy Bucker, MD;  Location: [facility] ORS;  Service: Orthopedics;  Laterality: Right;   skin cancer removed     TONSILLECTOMY     Patient Active Problem List   Diagnosis Date Noted   Acute respiratory failure due to COVID-19 (HCC) 05/28/2019   Alcohol dependence with uncomplicated withdrawal (HCC) 01/23/2018   Alcohol withdrawal (HCC) 01/23/2018   GIB (gastrointestinal bleeding) 12/09/2017   A-fib (HCC) 12/09/2017   Esophageal obstruction 10/10/2016   Esophageal ulceration 10/10/2016   GI bleed 09/20/2016   Low vitamin B12 level 03/18/2016   S/P kyphoplasty 03/18/2016   Basal cell carcinoma 02/08/2016   Atrial fibrillation (HCC) 01/27/2016   Colitis 01/27/2016   HLD (hyperlipidemia) 01/27/2016   Anxiety 01/27/2016   Hypokalemia 01/27/2016   Major depressive disorder, recurrent episode, moderate (HCC) 04/14/2015   Anxiety, generalized 12/06/2013   Parkinson disease 08/07/2013   Dysphagia 05/12/2013   Esophageal mass 05/12/2013   GERD (gastroesophageal reflux disease) 05/12/2013   Vomiting 05/12/2013   Open bite of lower leg 05/10/2013   Dog bite of lower leg 05/10/2013   Abnormal finding on liver function 03/25/2013   Benign neoplasm 03/25/2013   Dizziness 03/25/2013   Type 2 diabetes mellitus (HCC) 03/25/2013   BP (high blood pressure) 03/25/2013   Awareness of heartbeats 03/25/2013   Pure hypercholesterolemia 03/25/2013   Infective tonsillitis 03/25/2013   Adenomatous polyp 03/25/2013   ONSET DATE: 10 years   REFERRING DIAG: Parkinson's Disease  THERAPY DIAG:  Muscle weakness (generalized)  Other lack of  coordination  Rationale for Evaluation and Treatment: Rehabilitation  SUBJECTIVE:  SUBJECTIVE STATEMENT: Pt reports he is doing well today. Pt. Reports he has not ordered the Pen Again yet however he was able to fill out a check the other day without any help.  Pt accompanied by: self; spouse present in lobby, but did not follow for eval  PERTINENT HISTORY: Pt has been working with outpatient PT here at [facility] since end of Feb 2024.  OT ordered d/t worsening PD symptoms contributing to functional decline with ADLs.   PRECAUTIONS: Fall  WEIGHT BEARING RESTRICTIONS: No  PAIN:  Are you having pain? No pain reported today.  FALLS: Has patient fallen in last 6 months? Yes. Number of falls 1  LIVING ENVIRONMENT: Lives with: lives with their spouse Lives in: split level but pt remains on main level to avoid steps Stairs: Yes: Internal: 2 steps; can reach both Has following equipment at home: Quad cane small base, Walker - 4 wheeled, bed side commode, Grab bars, and pull down seat for shower, hand held shower hose,   PLOF: Needs assistance with ADLs; pt reports that spouse assists but is consistent to always let pt try things first before she automatically helps.    PATIENT GOALS: To be more indep  OBJECTIVE:  HAND DOMINANCE: Right (most affected side from PD)  ADLs:  Overall ADLs: spouse is primary caregiver Transfers/ambulation related to ADLs: uses RW Eating: spouse cuts food; pt reports having to use L non-dominant hand to eat for the last 5-6 yrs Grooming: set up; assist to squeeze toothpaste and denture paste; uses L non-dominant hand UB Dressing: set up; assist with clothing fasteners LB Dressing: Min A; assist with socks, dons slip on shoes with set up (has tried a sockaid but was unsuccessful) Toileting: modified indep Bathing: Set up/min A; spouse squeezes shampoo into pt's hands, spouse helps to towel dry  Tub Shower transfers: distant supv Equipment:  see above    IADLs: Shopping: pt can accompany spouse by pushing a shopping cart.  Light housekeeping: pt can drag trash to bin and can roll the bin to the road and back Meal Prep: spouse manages  Community mobility: spouse drives; uses rollator for longer distances  Medication management: spouse manages pills using weekly pill organizer.   Financial management: spouse manages  Handwriting: Mild micrographia and <25% legible  MOBILITY STATUS: Hx of falls  POSTURE COMMENTS:  rounded shoulders and forward head, R shoulder elevated  ACTIVITY TOLERANCE: Activity tolerance: TBD and assessed further with functional activities   FUNCTIONAL OUTCOME MEASURES: FOTO: TBD   UPPER EXTREMITY ROM:    Active ROM Right eval Left eval Right 07/12/2022 Left 07/12/2022  Shoulder flexion 118 85 120 117  Shoulder abduction 128 124 132 127  Shoulder adduction      Shoulder extension      Shoulder internal rotation [facility] WFL  Shoulder external rotation [facility] WFL  Elbow flexion      Elbow extension      Wrist flexion 55 76 59 75  Wrist extension 57 75 61 71  Wrist ulnar deviation      Wrist radial deviation      Wrist pronation [facility] WFL WFL  Wrist supination 63 75 66 81  (Blank rows = not tested)  UPPER EXTREMITY MMT:     MMT Right eval Left eval Right 07/12/2022 Left 07/12/2022  Shoulder flexion 4 4 4+ 4+  Shoulder abduction 4+ 4+ 4+ 4+  Shoulder adduction      Shoulder extension      Shoulder internal rotation      Shoulder external rotation      Middle trapezius      Lower trapezius      Elbow flexion 4+ 4+ 4+ 4+  Elbow extension 4+ 4+ 4+ 4+  Wrist flexion 4+ 4+ 4+ 4+  Wrist extension 4+ 4+ 4+ 4+  Wrist ulnar deviation      Wrist radial deviation      Wrist pronation      Wrist supination      (Blank rows = not tested)  HAND FUNCTION: Grip strength: Right: 16 lbs; Left: 38 lbs, Lateral pinch: Right: 10 lbs, Left: 14 lbs, and 3 point pinch: Right: 9 lbs, Left: 11  lbs 07/12/2022: Grip strength: Right: 16 lbs; Left: 40 lbs, Lateral pinch: Right: 10 lbs, Left: 14 lbs, and 3 point pinch: Right: 9 lbs, Left: 15 lbs COORDINATION: 9 Hole Peg test: Right: 38 sec; Left: 40 sec 07/12/2022: 9 Hole Peg test: Right: 35 sec; Left: 36 sec SENSATION: WFL  EDEMA: None  MUSCLE TONE: R/L normal  COGNITION: Overall cognitive status: Within functional limits for tasks assessed  VISION: Subjective report: wears glasses all the time    PRAXIS: Impaired: Motor planning  OBSERVATIONS:  R SF PIP flexion contracture 105* from  5-6 years ago from a fall  TODAY'S TREATMENT:                                                                                                                              DATE: 08/02/22:  Therapeutic Exercise:  Pt. worked on [employer] with use of a hand gripper set at 11.2# x 2 trials. Hand gripper set at 23.4# on the left x 2 trials. Hand grip strengthener was used to remove jumbo pegs from pegboard. Intermittent min vc and tactile cues for set up of hand gripper in hand to maximize closure with each gripping rep. Pt. performed resistive EZ Board exercises for forearm supination/pronation using gross grasp, and lateral pinch (key) grasp.   Self Care/Home Management: Pt. Worked on writing his name in print and cursive using the Pen Again. Pt. Wrote his first and last name in print x 6 trials and cursive x 3 trials .   PATIENT EDUCATION: Education details: Chartered certified accountant utensils to increase legibility of signature Person educated: Patient Education method: Explanation and Verbal cues, demo Education comprehension: verbalized understanding, demonstrated understanding  HOME EXERCISE PROGRAM: Theraputty   GOALS: Goals reviewed with patient? Yes  SHORT TERM GOALS: Target date: 07/07/22 (6 weeks)  Pt will be indep to perform HEP for improving bilat hand strength and coordination skills. Baseline: Eval: not yet initiated 07/12/2022:  Completing theraputty exercises independent at home Goal status: Met/Achieved  LONG TERM GOALS: Target date: 08/18/22 (12 weeks)  Pt will increase FOTO score to (TBD) or better to indicate improvement in self perceived functional use of the R arm with daily tasks. Baseline: Eval: TBD 07/12/2022: TBD Goal status: INITIAL  2.  Pt will increase R grip strength by 5 or more lbs to more easily hold and stabilize ADL supplies in the R dominant hand. Baseline: R grip 16 lbs; limited from PD but also by 5th digit PIP flexion contracture, as this digit can not engage in gripping (L 38 lbs) 07/12/2022: Grip strength: Right: 16 lbs; limited from PD but also by 5th digit PIP flexion contracture, as this digit still can not engage in gripping (L 40 lbs) Goal status: Ongoing  3.  Pt will improve R hand FMC/dexterity skills to be able to sign his name on medical/legal documents with 75% legibility, using adapted writing aids as needed. Baseline: Eval: <25% legible and pt verbalizes embarrassment when attempting to sign his name on forms 07/12/2022: 75% legibility when writing his name in print, however reports he is still not satisfied with his signature looks. Goal status: Ongoing  4.  Pt will increase R FMC/GMC skills to engage the RUE into UB ADLs at least 50% of the time. Baseline: Eval: Pt uses the L non-dominant arm for self care tasks. 07/12/22: Pt. Reports that he has been trying to incorporate his R hand while still using the L arm to complete all self care tasks.  Goal status: Ongoing  ASSESSMENT:  CLINICAL IMPRESSION:  Pt. Was able to remove jumbo pegs from peg board using the hand grip strengthener set to 11.2# on the R x 2 trials. Pt. Tolerated the hand grip strengthener set to 23.4# x 2 trials on the L to remove jumbo pegs from pegboard. Pt. Required frequent rest breaks throughout the second trial. Pt. continues to present with decreased  grip on the R d/t R Small Finger flexion contracture  preventing this digit from engaging in gripping on this hand. Pt. Was able to do EZ Board exercises however continues to require increased time and increased verbal cues to ensure key pinch was being used throughout. Pt. Was able to write his first and last name in print and cursive using the Pen Again. Pt. Printed his first and last name with 100% legibility using the Pen Again x 6 trials. Pt. Wrote his first and last name in cursive with 75% legibility using the Pen Again x 3 trials. Pt. Reported he was able to pay someone with a check the other day and filled it out by himself. Pt. Reports he plans to buy the Pen Again soon. Pt. continues to benefit from skilled OT for increasing bilateral hand strength, improve coordination, specifically working to increase engagement of the RUE during self care tasks.    PERFORMANCE DEFICITS: in functional skills including ADLs, IADLs, coordination, dexterity, ROM, strength, flexibility, Fine motor control, Gross motor control, mobility, balance, body mechanics, endurance, decreased knowledge of use of DME, and UE functional use, cognitive skills including emotional and memory, and psychosocial skills including coping strategies, environmental adaptation, habits, and routines and behaviors.   IMPAIRMENTS: are limiting patient from ADLs, IADLs, and leisure.   CO-MORBIDITIES: has co-morbidities such as anxiety, depression  that affects occupational performance. Patient will benefit from skilled OT to address above impairments and improve overall function.  MODIFICATION OR ASSISTANCE TO COMPLETE EVALUATION: No modification of tasks or assist necessary to complete an evaluation.  OT OCCUPATIONAL PROFILE AND HISTORY: Problem focused assessment: Including review of records relating to presenting problem.  CLINICAL DECISION MAKING: Moderate - several treatment options, min-mod task modification necessary  REHAB POTENTIAL: Good for goals  EVALUATION COMPLEXITY:  Moderate    PLAN:  OT FREQUENCY: 2x/week  OT DURATION: 12 weeks  PLANNED INTERVENTIONS: self care/ADL training, therapeutic exercise, therapeutic activity, neuromuscular re-education, manual therapy, passive range of motion, balance training, functional mobility training, moist heat, cryotherapy, patient/family education, cognitive remediation/compensation, psychosocial skills training, energy conservation, coping strategies training, and DME and/or AE instructions RECOMMENDED OTHER SERVICES: None at this time  CONSULTED AND AGREED WITH PLAN OF CARE: Patient  PLAN FOR NEXT SESSION: initiate HEP, complete Sudie Grumbling, OTS 08/02/2022, 2:54 PM  This entire session was performed under the direct supervision and direction of a licensed therapist. I have personally read, edited, and approve of the note as written.   Olegario Messier, MS, OTR/L   08/02/2022

## 2022-08-02 NOTE — Therapy (Signed)
Outpatient Physical Therapy Treatment    Patient Details  Name: Charles [facility] MRN: [id number] Date of Birth: 10-08-1955 Referring Provider (PT): Dr. Cristopher [facility]   Encounter Date: 08/02/2022  END OF SESSION:    PT End of Session - 08/02/22 1315     Visit Number 42    Number of Visits 62    Date for PT Re-Evaluation 10/11/22    Authorization Type Medicare A & B primary; federal employee secondary    Authorization Time Period -10/11/22    Progress Note Due on Visit 50    PT Start Time 1317    PT Stop Time 1359    PT Time Calculation (min) 42 min    Equipment Utilized During Treatment Gait belt    Activity Tolerance Patient tolerated treatment well;No increased pain    Behavior During Therapy Charles [facility] for tasks assessed/performed                       Past Medical History:  Diagnosis Date   Abnormal liver function    Anemia    Anxiety    Atrial fibrillation (HCC)    Cancer (HCC) 2017   skin   Colonic mass    Depression    Dizziness    Dry cough    History of colon polyps    Hypercholesteremia    Hyperlipemia    Hypertension    Palpitations    Parkinson's disease    Parkinson's disease    PVD (peripheral vascular disease) (HCC)    Tonsillitis 03/25/2013   Tremors of nervous system     Past Surgical History:  Procedure Laterality Date   BACK SURGERY     Kyphoplasty   March 2018   COLONOSCOPY     COLONOSCOPY WITH PROPOFOL N/A 12/12/2017   Procedure: COLONOSCOPY WITH PROPOFOL;  Surgeon: Charles Mood, MD;  Location: [facility] ENDOSCOPY;  Service: Gastroenterology;  Laterality: N/A;   ESOPHAGOGASTRODUODENOSCOPY N/A 12/11/2017   Procedure: ESOPHAGOGASTRODUODENOSCOPY (EGD);  Surgeon: Charles Mood, MD;  Location: [facility] ENDOSCOPY;  Service: Gastroenterology;  Laterality: N/A;   ESOPHAGOGASTRODUODENOSCOPY N/A 10/08/2021   Procedure: ESOPHAGOGASTRODUODENOSCOPY (EGD);  Surgeon: Charles Bill, MD;  Location: [facility] ENDOSCOPY;  Service: Endoscopy;  Laterality: N/A;    ESOPHAGOGASTRODUODENOSCOPY (EGD) WITH PROPOFOL N/A 09/21/2016   Procedure: ESOPHAGOGASTRODUODENOSCOPY (EGD) WITH PROPOFOL;  Surgeon: Charles Mood, MD;  Location: [facility] ENDOSCOPY;  Service: Gastroenterology;  Laterality: N/A;   ESOPHAGOGASTRODUODENOSCOPY (EGD) WITH PROPOFOL N/A 11/07/2016   Procedure: ESOPHAGOGASTRODUODENOSCOPY (EGD) WITH PROPOFOL;  Surgeon: Charles Mood, MD;  Location: [facility] ENDOSCOPY;  Service: Gastroenterology;  Laterality: N/A;   ESOPHAGOGASTRODUODENOSCOPY (EGD) WITH PROPOFOL N/A 10/15/2019   Procedure: ESOPHAGOGASTRODUODENOSCOPY (EGD) WITH PROPOFOL;  Surgeon: Charles Bill, MD;  Location: [facility] ENDOSCOPY;  Service: Endoscopy;  Laterality: N/A;   ESOPHAGOGASTRODUODENOSCOPY (EGD) WITH PROPOFOL N/A 10/28/2019   Procedure: ESOPHAGOGASTRODUODENOSCOPY (EGD) WITH PROPOFOL;  Surgeon: Charles Bill, MD;  Location: [facility] ENDOSCOPY;  Service: Endoscopy;  Laterality: N/A;   ESOPHAGOGASTRODUODENOSCOPY (EGD) WITH PROPOFOL N/A 12/02/2019   Procedure: ESOPHAGOGASTRODUODENOSCOPY (EGD) WITH PROPOFOL;  Surgeon: Charles Bill, MD;  Location: [facility] ENDOSCOPY;  Service: Endoscopy;  Laterality: N/A;   ESOPHAGOGASTRODUODENOSCOPY (EGD) WITH PROPOFOL N/A 02/18/2020   Procedure: ESOPHAGOGASTRODUODENOSCOPY (EGD) WITH PROPOFOL;  Surgeon: Charles Bill, MD;  Location: [facility] ENDOSCOPY;  Service: Endoscopy;  Laterality: N/A;   ESOPHAGOGASTRODUODENOSCOPY (EGD) WITH PROPOFOL N/A 08/02/2021   Procedure: ESOPHAGOGASTRODUODENOSCOPY (EGD) WITH PROPOFOL;  Surgeon: Charles Bill, MD;  Location: [facility] ENDOSCOPY;  Service: Endoscopy;  Laterality: N/A;  REQUEST AM   ESOPHAGOGASTRODUODENOSCOPY (EGD) WITH PROPOFOL N/A 10/05/2021   Procedure: ESOPHAGOGASTRODUODENOSCOPY (EGD) WITH PROPOFOL;  Surgeon: Charles Bill, MD;  Location: [facility] ENDOSCOPY;  Service: Endoscopy;  Laterality: N/A;   FRACTURE SURGERY Right 05/28/2019   distal radius fracture   HERNIA REPAIR     left inguinal hernia repair as an  infant   KYPHOPLASTY N/A 02/11/2016   Procedure: KYPHOPLASTY  L2,T9;  Surgeon: Charles Bucker, MD;  Location: [facility] ORS;  Service: Orthopedics;  Laterality: N/A;   KYPHOPLASTY N/A 03/15/2016   Procedure: KYPHOPLASTY L3;  Surgeon: Charles Bucker, MD;  Location: [facility] ORS;  Service: Orthopedics;  Laterality: N/A;   OPEN REDUCTION INTERNAL FIXATION (ORIF) DISTAL RADIAL FRACTURE Right 05/28/2019   Procedure: OPEN REDUCTION INTERNAL FIXATION (ORIF) DISTAL RADIAL FRACTURE;  Surgeon: Charles Bucker, MD;  Location: [facility] ORS;  Service: Orthopedics;  Laterality: Right;   skin cancer removed     TONSILLECTOMY       Subjective Assessment -      Subjective  Pt reports doing well today. Pt denies any recent falls/stumbles since prior session. Pt denies any updates to medications or medical appointment since prior session. Pt reports good compliance with HEP when time permits.      Pertinent History Charles [facility] is a 66yoM who was prescribed with Parkinson's Disease ~ 10 years.  Pt is followed by neurology, reports good reponse to medications which help control his tremors somewhat. Pt uses a SPC for AMB, also uses a walker for longer distance walking. Pt goes into community almost daily (they like to eat out) which includes lots of SPC [place] eand navigation of 4 stairs at home.    Currently in Pain? No/denies             TREATMENT: Unless otherwise stated, CGA was provided and gait belt donned in order to ensure pt safety  TE:   Octane fitness level 2 x 6 min for aerobic priming   NMR  PWR! Seated up, twist, rock and step for warm up of postural and truncal muscles. -pt provided with PWR! In seated handout and tasked with completion, still requires cues for PWR! Rock to get adequate weight shift.   PWR! Up works on postural strengthening and antigravity extension, PRW! Rock working on [employer] shifting, PRW! Twist targeting trunk rotation and PRW! Step targeting transition movements. PWR! Moves  target bradykinesia, rigidity, and dyskinesia through targeted functional movements that address four core movement difficulties for people with Parkinson's disease.  Ambulation through cafeteria of [facility] with multiple perturbations from Pt both post, and laterally. X 8-10 min   Ambulation with SPC weaving between obstacles with metronome in cadence with cane striking x 3 rounds of 2 min duration. Initial difficulty with keeping pace but improvement with practice and reps   Activity Description: step reaction practice  Activity Setting:  random Number of Pods:  4 Cycles/Sets:  2 Duration (Time or Hit Count):  30    Activity Description: blase reaction times with UE support  Activity Setting:  random Number of Pods:  4 Cycles/Sets:  2 Duration (Time or Hit Count):  30        PT Education -     Education Details Pt educated throughout session about proper posture and technique with exercises. Improved exercise technique, movement at target joints, use of target muscles after min to mod verbal, visual, tactile cues.      Person(s) Educated Patient    Methods Explanation;Demonstration ; handout  Comprehension Verbalized understanding;Returned demonstration;Need further instruction           PT Short Term Goals -       PT SHORT TERM GOAL #1   Title Pt to report regular performance of exercises prescribed for home and a sense of improvements in strength and balance AEB them not being as challenging anymore.    Baseline Will issue on visit 2    Time 4    Period Weeks    Status met   Target Date 03/15/22      PT SHORT TERM GOAL #2   Title Pt to demonstrate improved power AEB improved 5xSTS hands free in <18sec.    Baseline 03/01/22: 22sec hands free, author provideing foot block bilat 03/31/22: 14.82sec 4/23:17.3 sec   Time 4    Period Weeks    Status Met    Target Date 03/29/22               PT Long Term Goals - Target goal date for all remaining long term  goals is 07/19/2022        PT LONG TERM GOAL #1   Title Pt to improve score on FOTO survey to 57 to indicate reduced difficulty with basic mobility required for ADL performance.    Baseline 03/01/22:49 04/29/22:56%. 06/16/22: 60   Time 8    Period Weeks    Status Met          PT LONG TERM GOAL #2   Title Pt to improve tolerance to overground AMB c SPC AEB performance >866ft.    Baseline 2/27: fatigued after 459ft lap performed in four minutes ten seconds. 03/31/22: 732ft with SPC no rest break 04/26/22: 794 ft 06/16/22: 903 ft with SPC, no rest.   Time 8    Period Weeks    Status Met         PT LONG TERM GOAL #3   Title Pt to demonstrate improved leg power AEB 5xSTS from chair <14sec hands free and without need for minGuard assist for safety and without need for foot block.    Baseline 03/01/22: 22sec hands free, feet blocked, minGuard assist  03/31/22: 14.82sec  4/23:17.3 sec 06/16/22: 13.87 sec 7/16: 11.55 sec   Time 8     Period Weeks    Status MET         PT LONG TERM GOAL #4   Title Pt to demonstrate improvement in balance performance AEB >6 improvement BERG Balance for balance assessment.    Baseline 41 on 03/03/22. 3/38/24: 42 4/23: 45 06/16/22: 48   Time 8    Period Weeks    Status Met          5.  Pt will improve distance with LRAD to 900 feet or greater in order to indicate further progress to community ambulation distances Baseline: 794 ft with SPC , 06/16/22: 903 ft with SPC. 07/19/22: 905 feet Goal status: Met  6.  Patient will improve modified DGI (with use of cane) score > 4 points or greater in order to indicate improved balance and decreased risk of falls.  Baseline: 6/18: 11 7/16: 14  Goal status: IN PROGRESS  7. Patient will be able to withstand various light perturbations while preforming ambulation with LRAD utilizing safe balance strategies in order to simulate safe and confident walking in crowded places. Baseline: Pt currently does not feel safe  preforming task, could walk through cafeteria to get baseline. 7/23: 70% confidence with activity, mild  LOB with perturbations.  Goal Status: IN PROGRESS  8. Pt will be compliant and independent with Parkinson's based HEP by reciting and demonstrating full PWR! Move sets in seated and or standing with no cueing from therapist. Baseline: Pt currently needs demonstration or verbal cueing for proper completion  7/23: cues required for proper completion with PWR! Up, rock, and twist in seated position  Goal Status: IN PROGRESS     Plan     Clinical Impression Statement Continued with current plan of care as laid out in evaluation and recent prior sessions. Pt remains motivated to advance progress toward goals in order to maximize independence and safety at home. Pt requires high level assistance and cuing for completion of exercises in order to provide adequate level of stimulation and perturbation. Pt shows good progress with reaction times using blaze pods and plan to continue to progress this intervention in the future. Pt continues to demonstrate progress toward goals AEB progression of some interventions this date either in volume or intensity.     Personal Factors and Comorbidities Time since onset of injury/illness/exacerbation;Past/Current Experience    Examination-Activity Limitations Locomotion Level;Transfers;Bed Mobility;Reach Overhead;Hygiene/Grooming;Dressing;Toileting;Stairs;Bend    Stability/Clinical Decision Making Evolving/Moderate complexity    Clinical Decision Making Moderate    Rehab Potential Good    PT Frequency 2x / week    PT Duration 8 weeks    PT Treatment/Interventions ADLs/Self Care Home Management;Moist Heat;Gait training;Stair training;Functional mobility training;Therapeutic activities;Therapeutic exercise;Balance training;Neuromuscular re-education;Patient/family education    PT Next Visit Plan .  STS PWR! Up with eventual addition of PWR! Step. Work on weight  shift cues with STS. Dynamic balance/gait training as indicated.    PT Home Exercise Plan 03/01/22: discussed finding and elevated sit surface for future STS exercises (something ~18-19 inches high)    Consulted and Agree with Plan of Care Patient             Patient will benefit from skilled therapeutic intervention in order to improve the following deficits and impairments:  balance, strength, coordination, gait, transfers. Posture. ROM   Visit Diagnosis: Muscle weakness (generalized)  Abnormality of gait and mobility  Difficulty in walking, not elsewhere classified  Other abnormalities of gait and mobility  Unsteadiness on feet    Problem List Patient Active Problem List   Diagnosis Date Noted   Acute respiratory failure due to COVID-19 (HCC) 05/28/2019   Alcohol dependence with uncomplicated withdrawal (HCC) 01/23/2018   Alcohol withdrawal (HCC) 01/23/2018   GIB (gastrointestinal bleeding) 12/09/2017   A-fib (HCC) 12/09/2017   Esophageal obstruction 10/10/2016   Esophageal ulceration 10/10/2016   GI bleed 09/20/2016   Low vitamin B12 level 03/18/2016   S/P kyphoplasty 03/18/2016   Basal cell carcinoma 02/08/2016   Atrial fibrillation (HCC) 01/27/2016   Colitis 01/27/2016   HLD (hyperlipidemia) 01/27/2016   Anxiety 01/27/2016   Hypokalemia 01/27/2016   Major depressive disorder, recurrent episode, moderate (HCC) 04/14/2015   Anxiety, generalized 12/06/2013   Parkinson disease 08/07/2013   Dysphagia 05/12/2013   Esophageal mass 05/12/2013   GERD (gastroesophageal reflux disease) 05/12/2013   Vomiting 05/12/2013   Open bite of lower leg 05/10/2013   Dog bite of lower leg 05/10/2013   Abnormal finding on liver function 03/25/2013   Benign neoplasm 03/25/2013   Dizziness 03/25/2013   Type 2 diabetes mellitus (HCC) 03/25/2013   BP (high blood pressure) 03/25/2013   Awareness of heartbeats 03/25/2013   Pure hypercholesterolemia 03/25/2013   Infective  tonsillitis 03/25/2013   Adenomatous polyp 03/25/2013  Norman Herrlich PT ,DPT Physical Therapist- [place]  [facility]

## 2022-08-04 ENCOUNTER — Encounter: Payer: Self-pay | Admitting: Physical Therapy

## 2022-08-04 ENCOUNTER — Ambulatory Visit: Payer: Medicare Other | Attending: Neurology | Admitting: Physical Therapy

## 2022-08-04 ENCOUNTER — Ambulatory Visit: Payer: Medicare Other | Admitting: Occupational Therapy

## 2022-08-04 DIAGNOSIS — R269 Unspecified abnormalities of gait and mobility: Secondary | ICD-10-CM | POA: Diagnosis present

## 2022-08-04 DIAGNOSIS — R2689 Other abnormalities of gait and mobility: Secondary | ICD-10-CM | POA: Diagnosis present

## 2022-08-04 DIAGNOSIS — R262 Difficulty in walking, not elsewhere classified: Secondary | ICD-10-CM | POA: Diagnosis present

## 2022-08-04 DIAGNOSIS — M6281 Muscle weakness (generalized): Secondary | ICD-10-CM | POA: Diagnosis present

## 2022-08-04 DIAGNOSIS — R2681 Unsteadiness on feet: Secondary | ICD-10-CM | POA: Diagnosis present

## 2022-08-04 DIAGNOSIS — R278 Other lack of coordination: Secondary | ICD-10-CM | POA: Diagnosis present

## 2022-08-04 NOTE — Therapy (Signed)
Outpatient Physical Therapy Treatment    Patient Details  Name: Charles Stanton MRN: [id number] Date of Birth: 1955/01/17 Referring Provider (PT): Dr. Cristopher Stanton   Encounter Date: 08/04/2022  END OF SESSION:    PT End of Session - 08/04/22 1417     Visit Number 43    Number of Visits 62    Date for PT Re-Evaluation 10/11/22    Authorization Type Medicare A & B primary; federal employee secondary    Authorization Time Period -10/11/22    Progress Note Due on Visit 50    PT Start Time 1400    PT Stop Time 1440    PT Time Calculation (min) 40 min    Equipment Utilized During Treatment Gait belt    Activity Tolerance Patient tolerated treatment well;No increased pain    Behavior During Therapy Charles Stanton for tasks assessed/performed                       Past Medical History:  Diagnosis Date   Abnormal liver function    Anemia    Anxiety    Atrial fibrillation (HCC)    Cancer (HCC) 2017   skin   Colonic mass    Depression    Dizziness    Dry cough    History of colon polyps    Hypercholesteremia    Hyperlipemia    Hypertension    Palpitations    Parkinson's disease    Parkinson's disease    PVD (peripheral vascular disease) (HCC)    Tonsillitis 03/25/2013   Tremors of nervous system     Past Surgical History:  Procedure Laterality Date   BACK SURGERY     Kyphoplasty   March 2018   COLONOSCOPY     COLONOSCOPY WITH PROPOFOL N/A 12/12/2017   Procedure: COLONOSCOPY WITH PROPOFOL;  Surgeon: Charles Mood, MD;  Location: [facility] ENDOSCOPY;  Service: Gastroenterology;  Laterality: N/A;   ESOPHAGOGASTRODUODENOSCOPY N/A 12/11/2017   Procedure: ESOPHAGOGASTRODUODENOSCOPY (EGD);  Surgeon: Charles Mood, MD;  Location: [facility] ENDOSCOPY;  Service: Gastroenterology;  Laterality: N/A;   ESOPHAGOGASTRODUODENOSCOPY N/A 10/08/2021   Procedure: ESOPHAGOGASTRODUODENOSCOPY (EGD);  Surgeon: Charles Bill, MD;  Location: [facility] ENDOSCOPY;  Service: Endoscopy;  Laterality: N/A;    ESOPHAGOGASTRODUODENOSCOPY (EGD) WITH PROPOFOL N/A 09/21/2016   Procedure: ESOPHAGOGASTRODUODENOSCOPY (EGD) WITH PROPOFOL;  Surgeon: Charles Mood, MD;  Location: [facility] ENDOSCOPY;  Service: Gastroenterology;  Laterality: N/A;   ESOPHAGOGASTRODUODENOSCOPY (EGD) WITH PROPOFOL N/A 11/07/2016   Procedure: ESOPHAGOGASTRODUODENOSCOPY (EGD) WITH PROPOFOL;  Surgeon: Charles Mood, MD;  Location: [facility] ENDOSCOPY;  Service: Gastroenterology;  Laterality: N/A;   ESOPHAGOGASTRODUODENOSCOPY (EGD) WITH PROPOFOL N/A 10/15/2019   Procedure: ESOPHAGOGASTRODUODENOSCOPY (EGD) WITH PROPOFOL;  Surgeon: Charles Bill, MD;  Location: [facility] ENDOSCOPY;  Service: Endoscopy;  Laterality: N/A;   ESOPHAGOGASTRODUODENOSCOPY (EGD) WITH PROPOFOL N/A 10/28/2019   Procedure: ESOPHAGOGASTRODUODENOSCOPY (EGD) WITH PROPOFOL;  Surgeon: Charles Bill, MD;  Location: [facility] ENDOSCOPY;  Service: Endoscopy;  Laterality: N/A;   ESOPHAGOGASTRODUODENOSCOPY (EGD) WITH PROPOFOL N/A 12/02/2019   Procedure: ESOPHAGOGASTRODUODENOSCOPY (EGD) WITH PROPOFOL;  Surgeon: Charles Bill, MD;  Location: [facility] ENDOSCOPY;  Service: Endoscopy;  Laterality: N/A;   ESOPHAGOGASTRODUODENOSCOPY (EGD) WITH PROPOFOL N/A 02/18/2020   Procedure: ESOPHAGOGASTRODUODENOSCOPY (EGD) WITH PROPOFOL;  Surgeon: Charles Bill, MD;  Location: [facility] ENDOSCOPY;  Service: Endoscopy;  Laterality: N/A;   ESOPHAGOGASTRODUODENOSCOPY (EGD) WITH PROPOFOL N/A 08/02/2021   Procedure: ESOPHAGOGASTRODUODENOSCOPY (EGD) WITH PROPOFOL;  Surgeon: Charles Bill, MD;  Location: [facility] ENDOSCOPY;  Service: Endoscopy;  Laterality: N/A;  REQUEST AM   ESOPHAGOGASTRODUODENOSCOPY (EGD) WITH PROPOFOL N/A 10/05/2021   Procedure: ESOPHAGOGASTRODUODENOSCOPY (EGD) WITH PROPOFOL;  Surgeon: Charles Bill, MD;  Location: [facility] ENDOSCOPY;  Service: Endoscopy;  Laterality: N/A;   FRACTURE SURGERY Right 05/28/2019   distal radius fracture   HERNIA REPAIR     left inguinal hernia repair as an  infant   KYPHOPLASTY N/A 02/11/2016   Procedure: KYPHOPLASTY  L2,T9;  Surgeon: Charles Bucker, MD;  Location: [facility] ORS;  Service: Orthopedics;  Laterality: N/A;   KYPHOPLASTY N/A 03/15/2016   Procedure: KYPHOPLASTY L3;  Surgeon: Charles Bucker, MD;  Location: [facility] ORS;  Service: Orthopedics;  Laterality: N/A;   OPEN REDUCTION INTERNAL FIXATION (ORIF) DISTAL RADIAL FRACTURE Right 05/28/2019   Procedure: OPEN REDUCTION INTERNAL FIXATION (ORIF) DISTAL RADIAL FRACTURE;  Surgeon: Charles Bucker, MD;  Location: [facility] ORS;  Service: Orthopedics;  Laterality: Right;   skin cancer removed     TONSILLECTOMY       Subjective Assessment -      Subjective  Pt reports doing well today. Pt denies any recent falls/stumbles since prior session. Pt denies any updates to medications or medical appointment since prior session. Pt reports good compliance with HEP when time permits.      Pertinent History Charles Stanton is a 66yoM who was prescribed with Parkinson's Disease ~ 10 years.  Pt is followed by neurology, reports good reponse to medications which help control his tremors somewhat. Pt uses a SPC for AMB, also uses a walker for longer distance walking. Pt goes into community almost daily (they like to eat out) which includes lots of SPC [place] eand navigation of 4 stairs at home.    Currently in Pain? No/denies             TREATMENT:  Unless otherwise stated, CGA was provided and gait belt donned in order to ensure pt safety   TE:   [employer] fitness level 2 x 6 min for aerobic priming   NMR  PWR! Seated up, twist, rock and step for warm up of postural and truncal muscles. X 10 ea   Standing PWR! Up, rock, twist, and step x 10 ea   PWR! Up works on postural strengthening and antigravity extension, PRW! Rock working on [employer] shifting, PRW! Twist targeting trunk rotation and PRW! Step targeting transition movements. PWR! Moves target bradykinesia, rigidity, and dyskinesia through targeted functional  movements that address four core movement difficulties for people with Parkinson's disease.  Ambulation with SPC and with metronome at 56 BPM ( every other step with metronome) to medical arts building, seated rest then back ( about 1100 ft total)    Activity Description: step reaction practice  Activity Setting:  random Number of Pods:  4 Cycles/Sets:  4 Duration (Time or Hit Count):  20 .9-1.3 reaction time each round, no LOB, good ability to complete with one step.        PT Education -     Education Details Pt educated throughout session about proper posture and technique with exercises. Improved exercise technique, movement at target joints, use of target muscles after min to mod verbal, visual, tactile cues.      Person(s) Educated Patient    Methods Explanation;Demonstration ; handout   Comprehension Verbalized understanding;Returned demonstration;Need further instruction           PT Short Term Goals -       PT SHORT TERM GOAL #1   Title Pt to report regular performance of exercises prescribed  for home and a sense of improvements in strength and balance AEB them not being as challenging anymore.    Baseline Will issue on visit 2    Time 4    Period Weeks    Status met   Target Date 03/15/22      PT SHORT TERM GOAL #2   Title Pt to demonstrate improved power AEB improved 5xSTS hands free in <18sec.    Baseline 03/01/22: 22sec hands free, author provideing foot block bilat 03/31/22: 14.82sec 4/23:17.3 sec   Time 4    Period Weeks    Status Met    Target Date 03/29/22               PT Long Term Goals - Target goal date for all remaining long term goals is 07/19/2022        PT LONG TERM GOAL #1   Title Pt to improve score on FOTO survey to 57 to indicate reduced difficulty with basic mobility required for ADL performance.    Baseline 03/01/22:49 04/29/22:56%. 06/16/22: 60   Time 8    Period Weeks    Status Met          PT LONG TERM GOAL #2   Title  Pt to improve tolerance to overground AMB c SPC AEB performance >878ft.    Baseline 2/27: fatigued after 419ft lap performed in four minutes ten seconds. 03/31/22: 753ft with SPC no rest break 04/26/22: 794 ft 06/16/22: 903 ft with SPC, no rest.   Time 8    Period Weeks    Status Met         PT LONG TERM GOAL #3   Title Pt to demonstrate improved leg power AEB 5xSTS from chair <14sec hands free and without need for minGuard assist for safety and without need for foot block.    Baseline 03/01/22: 22sec hands free, feet blocked, minGuard assist  03/31/22: 14.82sec  4/23:17.3 sec 06/16/22: 13.87 sec 7/16: 11.55 sec   Time 8     Period Weeks    Status MET         PT LONG TERM GOAL #4   Title Pt to demonstrate improvement in balance performance AEB >6 improvement BERG Balance for balance assessment.    Baseline 41 on 03/03/22. 3/38/24: 42 4/23: 45 06/16/22: 48   Time 8    Period Weeks    Status Met          5.  Pt will improve distance with LRAD to 900 feet or greater in order to indicate further progress to community ambulation distances Baseline: 794 ft with SPC , 06/16/22: 903 ft with SPC. 07/19/22: 905 feet Goal status: Met  6.  Patient will improve modified DGI (with use of cane) score > 4 points or greater in order to indicate improved balance and decreased risk of falls.  Baseline: 6/18: 11 7/16: 14  Goal status: IN PROGRESS  7. Patient will be able to withstand various light perturbations while preforming ambulation with LRAD utilizing safe balance strategies in order to simulate safe and confident walking in crowded places. Baseline: Pt currently does not feel safe preforming task, could walk through cafeteria to get baseline. 7/23: 70% confidence with activity, mild LOB with perturbations.  Goal Status: IN PROGRESS  8. Pt will be compliant and independent with Parkinson's based HEP by reciting and demonstrating full PWR! Move sets in seated and or standing with no cueing  from therapist. Baseline: Pt currently needs demonstration  or verbal cueing for proper completion  7/23: cues required for proper completion with PWR! Up, rock, and twist in seated position  Goal Status: IN PROGRESS     Plan     Clinical Impression Statement Continued with current plan of care as laid out in evaluation and recent prior sessions. Pt remains motivated to advance progress toward goals in order to maximize independence and safety at home. Pt requires high level assistance and cuing for completion of exercises in order to provide adequate level of stimulation and perturbation. Pt shows good progress with reaction times using blaze pods and plan to continue to progress this intervention in the future. Pt shows independence with seated PWR! Moves but still needs cues for standing PWR! moves in order to perform them properly.  Pt continues to demonstrate progress toward goals AEB progression of some interventions this date either in volume or intensity.     Personal Factors and Comorbidities Time since onset of injury/illness/exacerbation;Past/Current Experience    Examination-Activity Limitations Locomotion Level;Transfers;Bed Mobility;Reach Overhead;Hygiene/Grooming;Dressing;Toileting;Stairs;Bend    Stability/Clinical Decision Making Evolving/Moderate complexity    Clinical Decision Making Moderate    Rehab Potential Good    PT Frequency 2x / week    PT Duration 8 weeks    PT Treatment/Interventions ADLs/Self Care Home Management;Moist Heat;Gait training;Stair training;Functional mobility training;Therapeutic activities;Therapeutic exercise;Balance training;Neuromuscular re-education;Patient/family education    PT Next Visit Plan .  Continue POC toward goals.    PT Home Exercise Plan 03/01/22: discussed finding and elevated sit surface for future STS exercises (something ~18-19 inches high)    Consulted and Agree with Plan of Care Patient             Patient will benefit from  skilled therapeutic intervention in order to improve the following deficits and impairments:  balance, strength, coordination, gait, transfers. Posture. ROM   Visit Diagnosis: Muscle weakness (generalized)  Abnormality of gait and mobility  Difficulty in walking, not elsewhere classified  Other abnormalities of gait and mobility  Unsteadiness on feet    Problem List Patient Active Problem List   Diagnosis Date Noted   Acute respiratory failure due to COVID-19 (HCC) 05/28/2019   Alcohol dependence with uncomplicated withdrawal (HCC) 01/23/2018   Alcohol withdrawal (HCC) 01/23/2018   GIB (gastrointestinal bleeding) 12/09/2017   A-fib (HCC) 12/09/2017   Esophageal obstruction 10/10/2016   Esophageal ulceration 10/10/2016   GI bleed 09/20/2016   Low vitamin B12 level 03/18/2016   S/P kyphoplasty 03/18/2016   Basal cell carcinoma 02/08/2016   Atrial fibrillation (HCC) 01/27/2016   Colitis 01/27/2016   HLD (hyperlipidemia) 01/27/2016   Anxiety 01/27/2016   Hypokalemia 01/27/2016   Major depressive disorder, recurrent episode, moderate (HCC) 04/14/2015   Anxiety, generalized 12/06/2013   Parkinson disease 08/07/2013   Dysphagia 05/12/2013   Esophageal mass 05/12/2013   GERD (gastroesophageal reflux disease) 05/12/2013   Vomiting 05/12/2013   Open bite of lower leg 05/10/2013   Dog bite of lower leg 05/10/2013   Abnormal finding on liver function 03/25/2013   Benign neoplasm 03/25/2013   Dizziness 03/25/2013   Type 2 diabetes mellitus (HCC) 03/25/2013   BP (high blood pressure) 03/25/2013   Awareness of heartbeats 03/25/2013   Pure hypercholesterolemia 03/25/2013   Infective tonsillitis 03/25/2013   Adenomatous polyp 03/25/2013      Norman Herrlich PT ,DPT Physical Therapist- [place]  [facility]

## 2022-08-04 NOTE — Therapy (Addendum)
OUTPATIENT OCCUPATIONAL THERAPY NEURO TREATMENT NOTE  Patient Name: Charles Stanton MRN: [id number] DOB:[birth year], 67 y.o., male Today]'s Date: 08/09/2022  PCP: Dr. Barbette Reichmann REFERRING PROVIDER: Dr. Cristopher Peru  END OF SESSION:  OT End of Session - 08/04/22 1332       Visit Number 17     Number of Visits 24     Date for OT Re-Evaluation 08/18/22     OT Start Time 1315     OT Stop Time 1358     OT Time Calculation (min) 43 min     Equipment Utilized During Treatment Cane     Activity Tolerance Patient tolerated treatment well     Behavior During Therapy [facility] for tasks assessed/performed      Past Medical History:  Diagnosis Date   Abnormal liver function    Anemia    Anxiety    Atrial fibrillation (HCC)    Cancer (HCC) 2017   skin   Colonic mass    Depression    Dizziness    Dry cough    History of colon polyps    Hypercholesteremia    Hyperlipemia    Hypertension    Palpitations    Parkinson's disease    Parkinson's disease    PVD (peripheral vascular disease) (HCC)    Tonsillitis 03/25/2013   Tremors of nervous system    Past Surgical History:  Procedure Laterality Date   BACK SURGERY     Kyphoplasty   March 2018   COLONOSCOPY     COLONOSCOPY WITH PROPOFOL N/A 12/12/2017   Procedure: COLONOSCOPY WITH PROPOFOL;  Surgeon: Wyline Mood, MD;  Location: [facility] ENDOSCOPY;  Service: Gastroenterology;  Laterality: N/A;   ESOPHAGOGASTRODUODENOSCOPY N/A 12/11/2017   Procedure: ESOPHAGOGASTRODUODENOSCOPY (EGD);  Surgeon: Wyline Mood, MD;  Location: [facility] ENDOSCOPY;  Service: Gastroenterology;  Laterality: N/A;   ESOPHAGOGASTRODUODENOSCOPY N/A 10/08/2021   Procedure: ESOPHAGOGASTRODUODENOSCOPY (EGD);  Surgeon: Regis Bill, MD;  Location: [facility] ENDOSCOPY;  Service: Endoscopy;  Laterality: N/A;   ESOPHAGOGASTRODUODENOSCOPY (EGD) WITH PROPOFOL N/A 09/21/2016   Procedure: ESOPHAGOGASTRODUODENOSCOPY (EGD) WITH PROPOFOL;  Surgeon: Wyline Mood, MD;  Location: [facility]  ENDOSCOPY;  Service: Gastroenterology;  Laterality: N/A;   ESOPHAGOGASTRODUODENOSCOPY (EGD) WITH PROPOFOL N/A 11/07/2016   Procedure: ESOPHAGOGASTRODUODENOSCOPY (EGD) WITH PROPOFOL;  Surgeon: Wyline Mood, MD;  Location: [facility] ENDOSCOPY;  Service: Gastroenterology;  Laterality: N/A;   ESOPHAGOGASTRODUODENOSCOPY (EGD) WITH PROPOFOL N/A 10/15/2019   Procedure: ESOPHAGOGASTRODUODENOSCOPY (EGD) WITH PROPOFOL;  Surgeon: Regis Bill, MD;  Location: [facility] ENDOSCOPY;  Service: Endoscopy;  Laterality: N/A;   ESOPHAGOGASTRODUODENOSCOPY (EGD) WITH PROPOFOL N/A 10/28/2019   Procedure: ESOPHAGOGASTRODUODENOSCOPY (EGD) WITH PROPOFOL;  Surgeon: Regis Bill, MD;  Location: [facility] ENDOSCOPY;  Service: Endoscopy;  Laterality: N/A;   ESOPHAGOGASTRODUODENOSCOPY (EGD) WITH PROPOFOL N/A 12/02/2019   Procedure: ESOPHAGOGASTRODUODENOSCOPY (EGD) WITH PROPOFOL;  Surgeon: Regis Bill, MD;  Location: [facility] ENDOSCOPY;  Service: Endoscopy;  Laterality: N/A;   ESOPHAGOGASTRODUODENOSCOPY (EGD) WITH PROPOFOL N/A 02/18/2020   Procedure: ESOPHAGOGASTRODUODENOSCOPY (EGD) WITH PROPOFOL;  Surgeon: Regis Bill, MD;  Location: [facility] ENDOSCOPY;  Service: Endoscopy;  Laterality: N/A;   ESOPHAGOGASTRODUODENOSCOPY (EGD) WITH PROPOFOL N/A 08/02/2021   Procedure: ESOPHAGOGASTRODUODENOSCOPY (EGD) WITH PROPOFOL;  Surgeon: Regis Bill, MD;  Location: [facility] ENDOSCOPY;  Service: Endoscopy;  Laterality: N/A;  REQUEST AM   ESOPHAGOGASTRODUODENOSCOPY (EGD) WITH PROPOFOL N/A 10/05/2021   Procedure: ESOPHAGOGASTRODUODENOSCOPY (EGD) WITH PROPOFOL;  Surgeon: Regis Bill, MD;  Location: [facility] ENDOSCOPY;  Service: Endoscopy;  Laterality: N/A;   FRACTURE SURGERY Right 05/28/2019   distal radius  fracture   HERNIA REPAIR     left inguinal hernia repair as an infant   KYPHOPLASTY N/A 02/11/2016   Procedure: KYPHOPLASTY  L2,T9;  Surgeon: Kennedy Bucker, MD;  Location: [facility] ORS;  Service: Orthopedics;  Laterality: N/A;    KYPHOPLASTY N/A 03/15/2016   Procedure: KYPHOPLASTY L3;  Surgeon: Kennedy Bucker, MD;  Location: [facility] ORS;  Service: Orthopedics;  Laterality: N/A;   OPEN REDUCTION INTERNAL FIXATION (ORIF) DISTAL RADIAL FRACTURE Right 05/28/2019   Procedure: OPEN REDUCTION INTERNAL FIXATION (ORIF) DISTAL RADIAL FRACTURE;  Surgeon: Kennedy Bucker, MD;  Location: [facility] ORS;  Service: Orthopedics;  Laterality: Right;   skin cancer removed     TONSILLECTOMY     Patient Active Problem List   Diagnosis Date Noted   Acute respiratory failure due to COVID-19 (HCC) 05/28/2019   Alcohol dependence with uncomplicated withdrawal (HCC) 01/23/2018   Alcohol withdrawal (HCC) 01/23/2018   GIB (gastrointestinal bleeding) 12/09/2017   A-fib (HCC) 12/09/2017   Esophageal obstruction 10/10/2016   Esophageal ulceration 10/10/2016   GI bleed 09/20/2016   Low vitamin B12 level 03/18/2016   S/P kyphoplasty 03/18/2016   Basal cell carcinoma 02/08/2016   Atrial fibrillation (HCC) 01/27/2016   Colitis 01/27/2016   HLD (hyperlipidemia) 01/27/2016   Anxiety 01/27/2016   Hypokalemia 01/27/2016   Major depressive disorder, recurrent episode, moderate (HCC) 04/14/2015   Anxiety, generalized 12/06/2013   Parkinson disease 08/07/2013   Dysphagia 05/12/2013   Esophageal mass 05/12/2013   GERD (gastroesophageal reflux disease) 05/12/2013   Vomiting 05/12/2013   Open bite of lower leg 05/10/2013   Dog bite of lower leg 05/10/2013   Abnormal finding on liver function 03/25/2013   Benign neoplasm 03/25/2013   Dizziness 03/25/2013   Type 2 diabetes mellitus (HCC) 03/25/2013   BP (high blood pressure) 03/25/2013   Awareness of heartbeats 03/25/2013   Pure hypercholesterolemia 03/25/2013   Infective tonsillitis 03/25/2013   Adenomatous polyp 03/25/2013   ONSET DATE: 10 years   REFERRING DIAG: Parkinson's Disease  THERAPY DIAG:  Muscle weakness (generalized)  Other lack of coordination  Rationale for Evaluation and Treatment:  Rehabilitation  SUBJECTIVE:  SUBJECTIVE STATEMENT: Pt reports he is doing well today. Pt. Reports he struggles to open up bottles and canned soda.  Pt accompanied by: self; spouse present in lobby, but did not follow for eval  PERTINENT HISTORY: Pt has been working with outpatient PT here at [facility] since end of Feb 2024.  OT ordered d/t worsening PD symptoms contributing to functional decline with ADLs.   PRECAUTIONS: Fall  WEIGHT BEARING RESTRICTIONS: No  PAIN:  Are you having pain? No pain reported today.  FALLS: Has patient fallen in last 6 months? Yes. Number of falls 1  LIVING ENVIRONMENT: Lives with: lives with their spouse Lives in: split level but pt remains on main level to avoid steps Stairs: Yes: Internal: 2 steps; can reach both Has following equipment at home: Quad cane small base, Walker - 4 wheeled, bed side commode, Grab bars, and pull down seat for shower, hand held shower hose,   PLOF: Needs assistance with ADLs; pt reports that spouse assists but is consistent to always let pt try things first before she automatically helps.    PATIENT GOALS: To be more indep  OBJECTIVE:  HAND DOMINANCE: Right (most affected side from PD)  ADLs: Overall ADLs: spouse is primary caregiver Transfers/ambulation related to ADLs: uses RW Eating: spouse cuts food; pt reports having to use L non-dominant hand to eat for the last  5-6 yrs Grooming: set up; assist to squeeze toothpaste and denture paste; uses L non-dominant hand UB Dressing: set up; assist with clothing fasteners LB Dressing: Min A; assist with socks, dons slip on shoes with set up (has tried a sockaid but was unsuccessful) Toileting: modified indep Bathing: Set up/min A; spouse squeezes shampoo into pt's hands, spouse helps to towel dry  Tub Shower transfers: distant supv Equipment:  see above   IADLs: Shopping: pt can accompany spouse by pushing a shopping cart.  Light housekeeping: pt can drag trash to bin and  can roll the bin to the road and back Meal Prep: spouse manages  Community mobility: spouse drives; uses rollator for longer distances  Medication management: spouse manages pills using weekly pill organizer.   Financial management: spouse manages  Handwriting: Mild micrographia and <25% legible  MOBILITY STATUS: Hx of falls  POSTURE COMMENTS:  rounded shoulders and forward head, R shoulder elevated  ACTIVITY TOLERANCE: Activity tolerance: TBD and assessed further with functional activities   FUNCTIONAL OUTCOME MEASURES: FOTO: TBD   UPPER EXTREMITY ROM:    Active ROM Right eval Left eval Right 07/12/2022 Left 07/12/2022  Shoulder flexion 118 85 120 117  Shoulder abduction 128 124 132 127  Shoulder adduction      Shoulder extension      Shoulder internal rotation [facility] WFL  Shoulder external rotation [facility] WFL  Elbow flexion      Elbow extension      Wrist flexion 55 76 59 75  Wrist extension 57 75 61 71  Wrist ulnar deviation      Wrist radial deviation      Wrist pronation [facility] WFL WFL  Wrist supination 63 75 66 81  (Blank rows = not tested)  UPPER EXTREMITY MMT:     MMT Right eval Left eval Right 07/12/2022 Left 07/12/2022  Shoulder flexion 4 4 4+ 4+  Shoulder abduction 4+ 4+ 4+ 4+  Shoulder adduction      Shoulder extension      Shoulder internal rotation      Shoulder external rotation      Middle trapezius      Lower trapezius      Elbow flexion 4+ 4+ 4+ 4+  Elbow extension 4+ 4+ 4+ 4+  Wrist flexion 4+ 4+ 4+ 4+  Wrist extension 4+ 4+ 4+ 4+  Wrist ulnar deviation      Wrist radial deviation      Wrist pronation      Wrist supination      (Blank rows = not tested)  HAND FUNCTION: Grip strength: Right: 16 lbs; Left: 38 lbs, Lateral pinch: Right: 10 lbs, Left: 14 lbs, and 3 point pinch: Right: 9 lbs, Left: 11 lbs 07/12/2022: Grip strength: Right: 16 lbs; Left: 40 lbs, Lateral pinch: Right: 10 lbs, Left: 14 lbs, and 3 point pinch: Right: 9  lbs, Left: 15 lbs COORDINATION: 9 Hole Peg test: Right: 38 sec; Left: 40 sec 07/12/2022: 9 Hole Peg test: Right: 35 sec; Left: 36 sec SENSATION: WFL  EDEMA: None  MUSCLE TONE: R/L normal  COGNITION: Overall cognitive status: Within functional limits for tasks assessed  VISION: Subjective report: wears glasses all the time    PRAXIS: Impaired: Motor planning  OBSERVATIONS:  R SF PIP flexion contracture 105* from 5-6 years ago from a fall  TODAY'S TREATMENT:  DATE: 08/04/2022:  Therapeutic Exercise:  Pt. worked on BUE hand strengthening with use of a hand gripper set at 11.2# on the right x1 trial and adjusted to 6.6# for trial 2. Hand gripper set at 23.4# on the left x 2 trials. Hand grip strengthener was used to remove jumbo pegs from pegboard. Intermittent min vc and tactile cues for set up of hand gripper in hand to maximize closure with each gripping rep. Pt. performed resistive EZ Board exercises for forearm supination/pronation using gross grasp, and lateral pinch (key) grasp.   PATIENT EDUCATION: Education details: Chartered certified accountant utensils to increase legibility of signature Person educated: Patient Education method: Explanation and Verbal cues, demo Education comprehension: verbalized understanding, demonstrated understanding  HOME EXERCISE PROGRAM: Theraputty   GOALS: Goals reviewed with patient? Yes  SHORT TERM GOALS: Target date: 07/07/22 (6 weeks)  Pt will be indep to perform HEP for improving bilat hand strength and coordination skills. Baseline: Eval: not yet initiated 07/12/2022: Completing theraputty exercises independent at home Goal status: Met/Achieved  LONG TERM GOALS: Target date: 08/18/22 (12 weeks)  Pt will increase FOTO score to (TBD) or better to indicate improvement in self perceived functional use of the R arm with daily  tasks. Baseline: Eval: TBD 07/12/2022: TBD Goal status: INITIAL  2.  Pt will increase R grip strength by 5 or more lbs to more easily hold and stabilize ADL supplies in the R dominant hand. Baseline: R grip 16 lbs; limited from PD but also by 5th digit PIP flexion contracture, as this digit can not engage in gripping (L 38 lbs) 07/12/2022: Grip strength: Right: 16 lbs; limited from PD but also by 5th digit PIP flexion contracture, as this digit still can not engage in gripping (L 40 lbs) Goal status: Ongoing  3.  Pt will improve R hand FMC/dexterity skills to be able to sign his name on medical/legal documents with 75% legibility, using adapted writing aids as needed. Baseline: Eval: <25% legible and pt verbalizes embarrassment when attempting to sign his name on forms 07/12/2022: 75% legibility when writing his name in print, however reports he is still not satisfied with his signature looks. Goal status: Ongoing  4.  Pt will increase R FMC/GMC skills to engage the RUE into UB ADLs at least 50% of the time. Baseline: Eval: Pt uses the L non-dominant arm for self care tasks. 07/12/22: Pt. Reports that he has been trying to incorporate his R hand while still using the L arm to complete all self care tasks.  Goal status: Ongoing  ASSESSMENT:  CLINICAL IMPRESSION:  Pt. Reported he feels like his grip strength is getting better in both the R and L hand. Pt. Was able to remove jumbo pegs from peg board using the hand grip strengthener set to 11.2# on the R x 1 trial however requested it be adjusted to 6.6# for the second trial due to R hand fatigue. Pt. Tolerated the hand grip strengthener set to 23.4# x 2 trials on the L to remove jumbo pegs from pegboard. Pt. Required frequent rest breaks throughout.  Pt. continues to present with decreased  grip on the R d/t R Small Finger flexion contracture preventing this digit from engaging in gripping on this hand. Pt. Was able to do EZ Board exercises and however  continues to require increased time. Pt. Required less verbal cues this session to ensure key pinch was being used. Pt. continues to benefit from skilled OT for increasing bilateral hand strength, improve coordination, specifically  working to increase engagement of the RUE during self care tasks.    PERFORMANCE DEFICITS: in functional skills including ADLs, IADLs, coordination, dexterity, ROM, strength, flexibility, Fine motor control, Gross motor control, mobility, balance, body mechanics, endurance, decreased knowledge of use of DME, and UE functional use, cognitive skills including emotional and memory, and psychosocial skills including coping strategies, environmental adaptation, habits, and routines and behaviors.   IMPAIRMENTS: are limiting patient from ADLs, IADLs, and leisure.   CO-MORBIDITIES: has co-morbidities such as anxiety, depression  that affects occupational performance. Patient will benefit from skilled OT to address above impairments and improve overall function.  MODIFICATION OR ASSISTANCE TO COMPLETE EVALUATION: No modification of tasks or assist necessary to complete an evaluation.  OT OCCUPATIONAL PROFILE AND HISTORY: Problem focused assessment: Including review of records relating to presenting problem.  CLINICAL DECISION MAKING: Moderate - several treatment options, min-mod task modification necessary  REHAB POTENTIAL: Good for goals  EVALUATION COMPLEXITY: Moderate    PLAN:  OT FREQUENCY: 2x/week  OT DURATION: 12 weeks  PLANNED INTERVENTIONS: self care/ADL training, therapeutic exercise, therapeutic activity, neuromuscular re-education, manual therapy, passive range of motion, balance training, functional mobility training, moist heat, cryotherapy, patient/family education, cognitive remediation/compensation, psychosocial skills training, energy conservation, coping strategies training, and DME and/or AE instructions RECOMMENDED OTHER SERVICES: None at this  time  CONSULTED AND AGREED WITH PLAN OF CARE: Patient  PLAN FOR NEXT SESSION: initiate HEP, complete Sudie Grumbling, OTS 08/04/2022, 2:15 PM  This entire session was performed under the direct supervision and direction of a licensed therapist. I have personally read, edited, and approve of the note as written.   Olegario Messier, MS, OTR/L   08/04/2022

## 2022-08-09 ENCOUNTER — Encounter: Payer: Self-pay | Admitting: Occupational Therapy

## 2022-08-09 ENCOUNTER — Ambulatory Visit: Payer: Medicare Other | Admitting: Physical Therapy

## 2022-08-09 ENCOUNTER — Ambulatory Visit: Payer: Medicare Other | Admitting: Occupational Therapy

## 2022-08-09 DIAGNOSIS — R2681 Unsteadiness on feet: Secondary | ICD-10-CM

## 2022-08-09 DIAGNOSIS — R269 Unspecified abnormalities of gait and mobility: Secondary | ICD-10-CM

## 2022-08-09 DIAGNOSIS — R2689 Other abnormalities of gait and mobility: Secondary | ICD-10-CM

## 2022-08-09 DIAGNOSIS — R262 Difficulty in walking, not elsewhere classified: Secondary | ICD-10-CM

## 2022-08-09 DIAGNOSIS — M6281 Muscle weakness (generalized): Secondary | ICD-10-CM

## 2022-08-09 DIAGNOSIS — R278 Other lack of coordination: Secondary | ICD-10-CM

## 2022-08-09 NOTE — Therapy (Signed)
OUTPATIENT OCCUPATIONAL THERAPY NEURO TREATMENT NOTE  Patient Name: Charles Stanton MRN: [id number] DOB:July 12, 1955, 67 y.o., male Today]'s Date: 08/09/2022  PCP: Dr. Barbette Reichmann REFERRING PROVIDER: Dr. Cristopher Peru  END OF SESSION:  OT End of Session - 08/09/22 1405     Visit Number 18    Number of Visits 24    Date for OT Re-Evaluation 08/18/22    OT Start Time 1403    OT Stop Time 1445    OT Time Calculation (min) 42 min    Equipment Utilized During Treatment Cane    Activity Tolerance Patient tolerated treatment well    Behavior During Therapy [facility] for tasks assessed/performed            OT End of Session - 08/04/22 1332       Visit Number 17     Number of Visits 24     Date for OT Re-Evaluation 08/18/22     OT Start Time 1315     OT Stop Time 1358     OT Time Calculation (min) 43 min     Equipment Utilized During Treatment Cane     Activity Tolerance Patient tolerated treatment well     Behavior During Therapy [facility] for tasks assessed/performed      Past Medical History:  Diagnosis Date   Abnormal liver function    Anemia    Anxiety    Atrial fibrillation (HCC)    Cancer (HCC) 2017   skin   Colonic mass    Depression    Dizziness    Dry cough    History of colon polyps    Hypercholesteremia    Hyperlipemia    Hypertension    Palpitations    Parkinson's disease    Parkinson's disease    PVD (peripheral vascular disease) (HCC)    Tonsillitis 03/25/2013   Tremors of nervous system    Past Surgical History:  Procedure Laterality Date   BACK SURGERY     Kyphoplasty   March 2018   COLONOSCOPY     COLONOSCOPY WITH PROPOFOL N/A 12/12/2017   Procedure: COLONOSCOPY WITH PROPOFOL;  Surgeon: Wyline Mood, MD;  Location: [facility] ENDOSCOPY;  Service: Gastroenterology;  Laterality: N/A;   ESOPHAGOGASTRODUODENOSCOPY N/A 12/11/2017   Procedure: ESOPHAGOGASTRODUODENOSCOPY (EGD);  Surgeon: Wyline Mood, MD;  Location: [facility] ENDOSCOPY;  Service: Gastroenterology;   Laterality: N/A;   ESOPHAGOGASTRODUODENOSCOPY N/A 10/08/2021   Procedure: ESOPHAGOGASTRODUODENOSCOPY (EGD);  Surgeon: Regis Bill, MD;  Location: [facility] ENDOSCOPY;  Service: Endoscopy;  Laterality: N/A;   ESOPHAGOGASTRODUODENOSCOPY (EGD) WITH PROPOFOL N/A 09/21/2016   Procedure: ESOPHAGOGASTRODUODENOSCOPY (EGD) WITH PROPOFOL;  Surgeon: Wyline Mood, MD;  Location: [facility] ENDOSCOPY;  Service: Gastroenterology;  Laterality: N/A;   ESOPHAGOGASTRODUODENOSCOPY (EGD) WITH PROPOFOL N/A 11/07/2016   Procedure: ESOPHAGOGASTRODUODENOSCOPY (EGD) WITH PROPOFOL;  Surgeon: Wyline Mood, MD;  Location: [facility] ENDOSCOPY;  Service: Gastroenterology;  Laterality: N/A;   ESOPHAGOGASTRODUODENOSCOPY (EGD) WITH PROPOFOL N/A 10/15/2019   Procedure: ESOPHAGOGASTRODUODENOSCOPY (EGD) WITH PROPOFOL;  Surgeon: Regis Bill, MD;  Location: [facility] ENDOSCOPY;  Service: Endoscopy;  Laterality: N/A;   ESOPHAGOGASTRODUODENOSCOPY (EGD) WITH PROPOFOL N/A 10/28/2019   Procedure: ESOPHAGOGASTRODUODENOSCOPY (EGD) WITH PROPOFOL;  Surgeon: Regis Bill, MD;  Location: [facility] ENDOSCOPY;  Service: Endoscopy;  Laterality: N/A;   ESOPHAGOGASTRODUODENOSCOPY (EGD) WITH PROPOFOL N/A 12/02/2019   Procedure: ESOPHAGOGASTRODUODENOSCOPY (EGD) WITH PROPOFOL;  Surgeon: Regis Bill, MD;  Location: [facility] ENDOSCOPY;  Service: Endoscopy;  Laterality: N/A;   ESOPHAGOGASTRODUODENOSCOPY (EGD) WITH PROPOFOL N/A 02/18/2020   Procedure: ESOPHAGOGASTRODUODENOSCOPY (EGD) WITH PROPOFOL;  Surgeon: Regis Bill, MD;  Location: [facility] ENDOSCOPY;  Service: Endoscopy;  Laterality: N/A;   ESOPHAGOGASTRODUODENOSCOPY (EGD) WITH PROPOFOL N/A 08/02/2021   Procedure: ESOPHAGOGASTRODUODENOSCOPY (EGD) WITH PROPOFOL;  Surgeon: Regis Bill, MD;  Location: [facility] ENDOSCOPY;  Service: Endoscopy;  Laterality: N/A;  REQUEST AM   ESOPHAGOGASTRODUODENOSCOPY (EGD) WITH PROPOFOL N/A 10/05/2021   Procedure: ESOPHAGOGASTRODUODENOSCOPY (EGD) WITH PROPOFOL;  Surgeon:  Regis Bill, MD;  Location: [facility] ENDOSCOPY;  Service: Endoscopy;  Laterality: N/A;   FRACTURE SURGERY Right 05/28/2019   distal radius fracture   HERNIA REPAIR     left inguinal hernia repair as an infant   KYPHOPLASTY N/A 02/11/2016   Procedure: KYPHOPLASTY  L2,T9;  Surgeon: Kennedy Bucker, MD;  Location: [facility] ORS;  Service: Orthopedics;  Laterality: N/A;   KYPHOPLASTY N/A 03/15/2016   Procedure: KYPHOPLASTY L3;  Surgeon: Kennedy Bucker, MD;  Location: [facility] ORS;  Service: Orthopedics;  Laterality: N/A;   OPEN REDUCTION INTERNAL FIXATION (ORIF) DISTAL RADIAL FRACTURE Right 05/28/2019   Procedure: OPEN REDUCTION INTERNAL FIXATION (ORIF) DISTAL RADIAL FRACTURE;  Surgeon: Kennedy Bucker, MD;  Location: [facility] ORS;  Service: Orthopedics;  Laterality: Right;   skin cancer removed     TONSILLECTOMY     Patient Active Problem List   Diagnosis Date Noted   Acute respiratory failure due to COVID-19 (HCC) 05/28/2019   Alcohol dependence with uncomplicated withdrawal (HCC) 01/23/2018   Alcohol withdrawal (HCC) 01/23/2018   GIB (gastrointestinal bleeding) 12/09/2017   A-fib (HCC) 12/09/2017   Esophageal obstruction 10/10/2016   Esophageal ulceration 10/10/2016   GI bleed 09/20/2016   Low vitamin B12 level 03/18/2016   S/P kyphoplasty 03/18/2016   Basal cell carcinoma 02/08/2016   Atrial fibrillation (HCC) 01/27/2016   Colitis 01/27/2016   HLD (hyperlipidemia) 01/27/2016   Anxiety 01/27/2016   Hypokalemia 01/27/2016   Major depressive disorder, recurrent episode, moderate (HCC) 04/14/2015   Anxiety, generalized 12/06/2013   Parkinson disease 08/07/2013   Dysphagia 05/12/2013   Esophageal mass 05/12/2013   GERD (gastroesophageal reflux disease) 05/12/2013   Vomiting 05/12/2013   Open bite of lower leg 05/10/2013   Dog bite of lower leg 05/10/2013   Abnormal finding on liver function 03/25/2013   Benign neoplasm 03/25/2013   Dizziness 03/25/2013   Type 2 diabetes mellitus (HCC) 03/25/2013    BP (high blood pressure) 03/25/2013   Awareness of heartbeats 03/25/2013   Pure hypercholesterolemia 03/25/2013   Infective tonsillitis 03/25/2013   Adenomatous polyp 03/25/2013   ONSET DATE: 10 years   REFERRING DIAG: Parkinson's Disease  THERAPY DIAG:  Muscle weakness (generalized)  Other lack of coordination  Rationale for Evaluation and Treatment: Rehabilitation  SUBJECTIVE:  SUBJECTIVE STATEMENT: Pt reports he is tired from PT. He had to write a check out last week.  Pt accompanied by: self; spouse present in lobby, but did not follow for eval  PERTINENT HISTORY: Pt has been working with outpatient PT here at [facility] since end of Feb 2024.  OT ordered d/t worsening PD symptoms contributing to functional decline with ADLs.   PRECAUTIONS: Fall  WEIGHT BEARING RESTRICTIONS: No  PAIN:  Are you having pain? No pain reported today.  FALLS: Has patient fallen in last 6 months? Yes. Number of falls 1  LIVING ENVIRONMENT: Lives with: lives with their spouse Lives in: split level but pt remains on main level to avoid steps Stairs: Yes: Internal: 2 steps; can reach both Has following equipment at home: Quad cane small base, Walker - 4 wheeled, bed side commode, Grab  bars, and pull down seat for shower, hand held shower hose,   PLOF: Needs assistance with ADLs; pt reports that spouse assists but is consistent to always let pt try things first before she automatically helps.    PATIENT GOALS: To be more indep  OBJECTIVE:  HAND DOMINANCE: Right (most affected side from PD)  ADLs: Overall ADLs: spouse is primary caregiver Transfers/ambulation related to ADLs: uses RW Eating: spouse cuts food; pt reports having to use L non-dominant hand to eat for the last 5-6 yrs Grooming: set up; assist to squeeze toothpaste and denture paste; uses L non-dominant hand UB Dressing: set up; assist with clothing fasteners LB Dressing: Min A; assist with socks, dons slip on shoes with set up  (has tried a sockaid but was unsuccessful) Toileting: modified indep Bathing: Set up/min A; spouse squeezes shampoo into pt's hands, spouse helps to towel dry  Tub Shower transfers: distant supv Equipment:  see above   IADLs: Shopping: pt can accompany spouse by pushing a shopping cart.  Light housekeeping: pt can drag trash to bin and can roll the bin to the road and back Meal Prep: spouse manages  Community mobility: spouse drives; uses rollator for longer distances  Medication management: spouse manages pills using weekly pill organizer.   Financial management: spouse manages  Handwriting: Mild micrographia and <25% legible  MOBILITY STATUS: Hx of falls  POSTURE COMMENTS:  rounded shoulders and forward head, R shoulder elevated  ACTIVITY TOLERANCE: Activity tolerance: TBD and assessed further with functional activities   FUNCTIONAL OUTCOME MEASURES: FOTO: TBD   UPPER EXTREMITY ROM:    Active ROM Right eval Left eval Right 07/12/2022 Left 07/12/2022  Shoulder flexion 118 85 120 117  Shoulder abduction 128 124 132 127  Shoulder adduction      Shoulder extension      Shoulder internal rotation [facility] WFL  Shoulder external rotation [facility] WFL  Elbow flexion      Elbow extension      Wrist flexion 55 76 59 75  Wrist extension 57 75 61 71  Wrist ulnar deviation      Wrist radial deviation      Wrist pronation [facility] WFL WFL  Wrist supination 63 75 66 81  (Blank rows = not tested)  UPPER EXTREMITY MMT:     MMT Right eval Left eval Right 07/12/2022 Left 07/12/2022  Shoulder flexion 4 4 4+ 4+  Shoulder abduction 4+ 4+ 4+ 4+  Shoulder adduction      Shoulder extension      Shoulder internal rotation      Shoulder external rotation      Middle trapezius      Lower trapezius      Elbow flexion 4+ 4+ 4+ 4+  Elbow extension 4+ 4+ 4+ 4+  Wrist flexion 4+ 4+ 4+ 4+  Wrist extension 4+ 4+ 4+ 4+  Wrist ulnar deviation      Wrist radial deviation      Wrist  pronation      Wrist supination      (Blank rows = not tested)  HAND FUNCTION: Grip strength: Right: 16 lbs; Left: 38 lbs, Lateral pinch: Right: 10 lbs, Left: 14 lbs, and 3 point pinch: Right: 9 lbs, Left: 11 lbs 07/12/2022: Grip strength: Right: 16 lbs; Left: 40 lbs, Lateral pinch: Right: 10 lbs, Left: 14 lbs, and 3 point pinch: Right: 9 lbs, Left: 15 lbs COORDINATION: 9 Hole Peg test: Right: 38 sec; Left: 40  sec 07/12/2022: 9 Hole Peg test: Right: 35 sec; Left: 36 sec SENSATION: WFL  EDEMA: None  MUSCLE TONE: R/L normal  COGNITION: Overall cognitive status: Within functional limits for tasks assessed  VISION: Subjective report: wears glasses all the time    PRAXIS: Impaired: Motor planning  OBSERVATIONS:  R SF PIP flexion contracture 105* from 5-6 years ago from a fall  TODAY'S TREATMENT:                                                                                                                              DATE: 08/09/2022:  Self Care: Pt worked on Careers information officer and speed using the PenAgain to print and sign name and digits 1-10. 75% legibility with decreasing letter size as pt fatigues.   Therapeutic Exercise:  Pt. worked on [employer] with use of a hand gripper set at 11.2# on the right x2 trials and set at 23.4# on the left x 2 trials. Hand grip strengthener was used to remove jumbo pegs from pegboard. Utilized L and R hand to set up pegs, maintaining grip on 2 pegs without dropping.   Therapeutic Activity:  Pt performed [facility] tasks using the Grooved pegboard. Pt worked on grasping the grooved pegs from a horizontal position and worked on Comptroller, moving the pegs to a vertical position in the hand to prepare for placing them in the grooved slot. Complete 10 pegs with L hand and 10 with R hand, increased time for R hand. Removed with R hand storing 4-8 pegs in palm without dropping. 2 pegs placed in R palm with pt focused on manipulating to  fingertips one at a time. Alternated L and R hand with no dropping.    PATIENT EDUCATION: Education details: Chartered certified accountant utensils to increase legibility of signature Person educated: Patient Education method: Explanation and Verbal cues, demo Education comprehension: verbalized understanding, demonstrated understanding  HOME EXERCISE PROGRAM: Theraputty   GOALS: Goals reviewed with patient? Yes  SHORT TERM GOALS: Target date: 07/07/22 (6 weeks)  Pt will be indep to perform HEP for improving bilat hand strength and coordination skills. Baseline: Eval: not yet initiated 07/12/2022: Completing theraputty exercises independent at home Goal status: Met/Achieved  LONG TERM GOALS: Target date: 08/18/22 (12 weeks)  Pt will increase FOTO score to (TBD) or better to indicate improvement in self perceived functional use of the R arm with daily tasks. Baseline: Eval: TBD 07/12/2022: TBD Goal status: INITIAL  2.  Pt will increase R grip strength by 5 or more lbs to more easily hold and stabilize ADL supplies in the R dominant hand. Baseline: R grip 16 lbs; limited from PD but also by 5th digit PIP flexion contracture, as this digit can not engage in gripping (L 38 lbs) 07/12/2022: Grip strength: Right: 16 lbs; limited from PD but also by 5th digit PIP flexion contracture, as this digit still can not engage in gripping (L 40 lbs) Goal status: Ongoing  3.  Pt will improve R hand FMC/dexterity skills to be able to sign his name on medical/legal documents with 75% legibility, using adapted writing aids as needed. Baseline: Eval: <25% legible and pt verbalizes embarrassment when attempting to sign his name on forms 07/12/2022: 75% legibility when writing his name in print, however reports he is still not satisfied with his signature looks. Goal status: Ongoing  4.  Pt will increase R FMC/GMC skills to engage the RUE into UB ADLs at least 50% of the time. Baseline: Eval: Pt uses the L non-dominant arm  for self care tasks. 07/12/22: Pt. Reports that he has been trying to incorporate his R hand while still using the L arm to complete all self care tasks.  Goal status: Ongoing  ASSESSMENT:  CLINICAL IMPRESSION:  Tolerated the hand grip strengthener set to 23.4# x 2 trials on the L to remove jumbo pegs from pegboard, set to 11.2# on the R x 2 trials. Pt. Required frequent rest breaks throughout.  Pt. continues to present with decreased  grip on the R d/t R Small Finger flexion contracture preventing this digit from engaging in gripping on this hand.  Improved FMC utilizing grooved peg board, pegs placed in palm with no dropping noted as pt manipulated pegs to fingertip prior to placing in board. Pt. continues to benefit from skilled OT for increasing bilateral hand strength, improve coordination, specifically working to increase engagement of the RUE during self care tasks.    PERFORMANCE DEFICITS: in functional skills including ADLs, IADLs, coordination, dexterity, ROM, strength, flexibility, Fine motor control, Gross motor control, mobility, balance, body mechanics, endurance, decreased knowledge of use of DME, and UE functional use, cognitive skills including emotional and memory, and psychosocial skills including coping strategies, environmental adaptation, habits, and routines and behaviors.   IMPAIRMENTS: are limiting patient from ADLs, IADLs, and leisure.   CO-MORBIDITIES: has co-morbidities such as anxiety, depression  that affects occupational performance. Patient will benefit from skilled OT to address above impairments and improve overall function.  MODIFICATION OR ASSISTANCE TO COMPLETE EVALUATION: No modification of tasks or assist necessary to complete an evaluation.  OT OCCUPATIONAL PROFILE AND HISTORY: Problem focused assessment: Including review of records relating to presenting problem.  CLINICAL DECISION MAKING: Moderate - several treatment options, min-mod task modification  necessary  REHAB POTENTIAL: Good for goals  EVALUATION COMPLEXITY: Moderate    PLAN:  OT FREQUENCY: 2x/week  OT DURATION: 12 weeks  PLANNED INTERVENTIONS: self care/ADL training, therapeutic exercise, therapeutic activity, neuromuscular re-education, manual therapy, passive range of motion, balance training, functional mobility training, moist heat, cryotherapy, patient/family education, cognitive remediation/compensation, psychosocial skills training, energy conservation, coping strategies training, and DME and/or AE instructions RECOMMENDED OTHER SERVICES: None at this time  CONSULTED AND AGREED WITH PLAN OF CARE: Patient  PLAN FOR NEXT SESSION: handwriting  Kathie Dike, M.S. OTR/L  08/09/22, 2:05 PM  ascom [phone]

## 2022-08-09 NOTE — Therapy (Signed)
Outpatient Physical Therapy Treatment    Patient Details  Name: Charles Stanton MRN: [id number] Date of Birth: [birth year] Referring Provider (PT): Dr. Cristopher Peru   Encounter Date: 08/09/2022  END OF SESSION:    PT End of Session - 08/09/22 1319     Visit Number 44    Number of Visits 62    Date for PT Re-Evaluation 10/11/22    Authorization Type Medicare A & B primary; federal employee secondary    Authorization Time Period -10/11/22    Progress Note Due on Visit 50    PT Start Time 1317    PT Stop Time 1358    PT Time Calculation (min) 41 min    Equipment Utilized During Treatment Gait belt    Activity Tolerance Patient tolerated treatment well;No increased pain    Behavior During Therapy [facility] for tasks assessed/performed                       Past Medical History:  Diagnosis Date   Abnormal liver function    Anemia    Anxiety    Atrial fibrillation (HCC)    Cancer (HCC) 2017   skin   Colonic mass    Depression    Dizziness    Dry cough    History of colon polyps    Hypercholesteremia    Hyperlipemia    Hypertension    Palpitations    Parkinson's disease    Parkinson's disease    PVD (peripheral vascular disease) (HCC)    Tonsillitis 03/25/2013   Tremors of nervous system     Past Surgical History:  Procedure Laterality Date   BACK SURGERY     Kyphoplasty   March 2018   COLONOSCOPY     COLONOSCOPY WITH PROPOFOL N/A 12/12/2017   Procedure: COLONOSCOPY WITH PROPOFOL;  Surgeon: Wyline Mood, MD;  Location: [facility] ENDOSCOPY;  Service: Gastroenterology;  Laterality: N/A;   ESOPHAGOGASTRODUODENOSCOPY N/A 12/11/2017   Procedure: ESOPHAGOGASTRODUODENOSCOPY (EGD);  Surgeon: Wyline Mood, MD;  Location: [facility] ENDOSCOPY;  Service: Gastroenterology;  Laterality: N/A;   ESOPHAGOGASTRODUODENOSCOPY N/A 10/08/2021   Procedure: ESOPHAGOGASTRODUODENOSCOPY (EGD);  Surgeon: Regis Bill, MD;  Location: [facility] ENDOSCOPY;  Service: Endoscopy;  Laterality: N/A;    ESOPHAGOGASTRODUODENOSCOPY (EGD) WITH PROPOFOL N/A 09/21/2016   Procedure: ESOPHAGOGASTRODUODENOSCOPY (EGD) WITH PROPOFOL;  Surgeon: Wyline Mood, MD;  Location: [facility] ENDOSCOPY;  Service: Gastroenterology;  Laterality: N/A;   ESOPHAGOGASTRODUODENOSCOPY (EGD) WITH PROPOFOL N/A 11/07/2016   Procedure: ESOPHAGOGASTRODUODENOSCOPY (EGD) WITH PROPOFOL;  Surgeon: Wyline Mood, MD;  Location: [facility] ENDOSCOPY;  Service: Gastroenterology;  Laterality: N/A;   ESOPHAGOGASTRODUODENOSCOPY (EGD) WITH PROPOFOL N/A 10/15/2019   Procedure: ESOPHAGOGASTRODUODENOSCOPY (EGD) WITH PROPOFOL;  Surgeon: Regis Bill, MD;  Location: [facility] ENDOSCOPY;  Service: Endoscopy;  Laterality: N/A;   ESOPHAGOGASTRODUODENOSCOPY (EGD) WITH PROPOFOL N/A 10/28/2019   Procedure: ESOPHAGOGASTRODUODENOSCOPY (EGD) WITH PROPOFOL;  Surgeon: Regis Bill, MD;  Location: [facility] ENDOSCOPY;  Service: Endoscopy;  Laterality: N/A;   ESOPHAGOGASTRODUODENOSCOPY (EGD) WITH PROPOFOL N/A 12/02/2019   Procedure: ESOPHAGOGASTRODUODENOSCOPY (EGD) WITH PROPOFOL;  Surgeon: Regis Bill, MD;  Location: [facility] ENDOSCOPY;  Service: Endoscopy;  Laterality: N/A;   ESOPHAGOGASTRODUODENOSCOPY (EGD) WITH PROPOFOL N/A 02/18/2020   Procedure: ESOPHAGOGASTRODUODENOSCOPY (EGD) WITH PROPOFOL;  Surgeon: Regis Bill, MD;  Location: [facility] ENDOSCOPY;  Service: Endoscopy;  Laterality: N/A;   ESOPHAGOGASTRODUODENOSCOPY (EGD) WITH PROPOFOL N/A 08/02/2021   Procedure: ESOPHAGOGASTRODUODENOSCOPY (EGD) WITH PROPOFOL;  Surgeon: Regis Bill, MD;  Location: [facility] ENDOSCOPY;  Service: Endoscopy;  Laterality: N/A;  REQUEST AM   ESOPHAGOGASTRODUODENOSCOPY (EGD) WITH PROPOFOL N/A 10/05/2021   Procedure: ESOPHAGOGASTRODUODENOSCOPY (EGD) WITH PROPOFOL;  Surgeon: Regis Bill, MD;  Location: [facility] ENDOSCOPY;  Service: Endoscopy;  Laterality: N/A;   FRACTURE SURGERY Right 05/28/2019   distal radius fracture   HERNIA REPAIR     left inguinal hernia repair as an  infant   KYPHOPLASTY N/A 02/11/2016   Procedure: KYPHOPLASTY  L2,T9;  Surgeon: Kennedy Bucker, MD;  Location: [facility] ORS;  Service: Orthopedics;  Laterality: N/A;   KYPHOPLASTY N/A 03/15/2016   Procedure: KYPHOPLASTY L3;  Surgeon: Kennedy Bucker, MD;  Location: [facility] ORS;  Service: Orthopedics;  Laterality: N/A;   OPEN REDUCTION INTERNAL FIXATION (ORIF) DISTAL RADIAL FRACTURE Right 05/28/2019   Procedure: OPEN REDUCTION INTERNAL FIXATION (ORIF) DISTAL RADIAL FRACTURE;  Surgeon: Kennedy Bucker, MD;  Location: [facility] ORS;  Service: Orthopedics;  Laterality: Right;   skin cancer removed     TONSILLECTOMY       Subjective Assessment -      Subjective  Pt reports doing well today. Pt denies any recent falls/stumbles since prior session. Pt denies any updates to medications or medical appointment since prior session. Pt reports good compliance with HEP when time permits.      Pertinent History Charles Stanton is a 66yoM who was prescribed with Parkinson's Disease ~ 10 years.  Pt is followed by neurology, reports good reponse to medications which help control his tremors somewhat. Pt uses a SPC for AMB, also uses a walker for longer distance walking. Pt goes into community almost daily (they like to eat out) which includes lots of SPC [place] eand navigation of 4 stairs at home.    Currently in Pain? No/denies             TREATMENT:  Unless otherwise stated, CGA was provided and gait belt donned in order to ensure pt safety   TE:   Octane fitness level 3 x 6 min for aerobic priming   NMR  PWR! Seated up, twist, rock and step for warm up of postural and truncal muscles. X 10 ea   Standing PWR! Up, rock, twist, and step x 10 ea - modified to have UE on chair for all interventions   Ambulation with SPC and with metronome at 56 BPM ( every other step with metronome) to medical arts building, seated rest then back ( about 1100 ft total)  Intermittent perturbations from PT to mimic someone bumping into him in  community setting    PWR! Moves X 10 ea altered all 4s trunk rotation to standing with arms on chair and altered all 4s step with UE on chair x 10 ea LE.   -PWR! Up works on postural strengthening and antigravity extension, PRW! Rock working on [employer] shifting, PRW! Twist targeting trunk rotation and PRW! Step targeting transition movements. PWR! Moves target bradykinesia, rigidity, and dyskinesia through targeted functional movements that address four core movement difficulties for people with Parkinson's disease.  Activity Description: step reaction practice  Activity Setting:  random Number of Pods:  4 Cycles/Sets:  2 Duration (Time or Hit Count):  25 .9-1.3 reaction time each round, no LOB, good ability to complete with one step.   STS x 10 with cues for forward trunk lean initially, no UE assist      PT Education -     Education Details Pt educated throughout session about proper posture and technique with exercises. Improved exercise technique, movement at target joints, use of target muscles  after min to mod verbal, visual, tactile cues.      Person(s) Educated Patient    Methods Explanation;Demonstration ; handout   Comprehension Verbalized understanding;Returned demonstration;Need further instruction           PT Short Term Goals -       PT SHORT TERM GOAL #1   Title Pt to report regular performance of exercises prescribed for home and a sense of improvements in strength and balance AEB them not being as challenging anymore.    Baseline Will issue on visit 2    Time 4    Period Weeks    Status met   Target Date 03/15/22      PT SHORT TERM GOAL #2   Title Pt to demonstrate improved power AEB improved 5xSTS hands free in <18sec.    Baseline 03/01/22: 22sec hands free, author provideing foot block bilat 03/31/22: 14.82sec 4/23:17.3 sec   Time 4    Period Weeks    Status Met    Target Date 03/29/22               PT Long Term Goals - Target goal  date for all remaining long term goals is 07/19/2022        PT LONG TERM GOAL #1   Title Pt to improve score on FOTO survey to 57 to indicate reduced difficulty with basic mobility required for ADL performance.    Baseline 03/01/22:49 04/29/22:56%. 06/16/22: 60   Time 8    Period Weeks    Status Met          PT LONG TERM GOAL #2   Title Pt to improve tolerance to overground AMB c SPC AEB performance >849ft.    Baseline 2/27: fatigued after 422ft lap performed in four minutes ten seconds. 03/31/22: 719ft with SPC no rest break 04/26/22: 794 ft 06/16/22: 903 ft with SPC, no rest.   Time 8    Period Weeks    Status Met         PT LONG TERM GOAL #3   Title Pt to demonstrate improved leg power AEB 5xSTS from chair <14sec hands free and without need for minGuard assist for safety and without need for foot block.    Baseline 03/01/22: 22sec hands free, feet blocked, minGuard assist  03/31/22: 14.82sec  4/23:17.3 sec 06/16/22: 13.87 sec 7/16: 11.55 sec   Time 8     Period Weeks    Status MET         PT LONG TERM GOAL #4   Title Pt to demonstrate improvement in balance performance AEB >6 improvement BERG Balance for balance assessment.    Baseline 41 on 03/03/22. 3/38/24: 42 4/23: 45 06/16/22: 48   Time 8    Period Weeks    Status Met          5.  Pt will improve distance with LRAD to 900 feet or greater in order to indicate further progress to community ambulation distances Baseline: 794 ft with SPC , 06/16/22: 903 ft with SPC. 07/19/22: 905 feet Goal status: Met  6.  Patient will improve modified DGI (with use of cane) score > 4 points or greater in order to indicate improved balance and decreased risk of falls.  Baseline: 6/18: 11 7/16: 14  Goal status: IN PROGRESS  7. Patient will be able to withstand various light perturbations while preforming ambulation with LRAD utilizing safe balance strategies in order to simulate safe and confident walking in  crowded places. Baseline: Pt  currently does not feel safe preforming task, could walk through cafeteria to get baseline. 7/23: 70% confidence with activity, mild LOB with perturbations.  Goal Status: IN PROGRESS  8. Pt will be compliant and independent with Parkinson's based HEP by reciting and demonstrating full PWR! Move sets in seated and or standing with no cueing from therapist. Baseline: Pt currently needs demonstration or verbal cueing for proper completion  7/23: cues required for proper completion with PWR! Up, rock, and twist in seated position  Goal Status: IN PROGRESS     Plan     Clinical Impression Statement Continued with current plan of care as laid out in evaluation and recent prior sessions. Pt remains motivated to advance progress toward goals in order to maximize independence and safety at home. Pt requires high level assistance and cuing for completion of exercises in order to provide adequate level of stimulation and perturbation. Pt shows good progress with reaction times using blaze pods and plan to continue to progress this intervention in the future. Pt shows independence with seated PWR! Moves but still needs cues for standing PWR! moves in order to perform them properly.  Pt will continue to benefit from skilled physical therapy intervention to address impairments, improve QOL, and attain therapy goals.      Personal Factors and Comorbidities Time since onset of injury/illness/exacerbation;Past/Current Experience    Examination-Activity Limitations Locomotion Level;Transfers;Bed Mobility;Reach Overhead;Hygiene/Grooming;Dressing;Toileting;Stairs;Bend    Stability/Clinical Decision Making Evolving/Moderate complexity    Clinical Decision Making Moderate    Rehab Potential Good    PT Frequency 2x / week    PT Duration 8 weeks    PT Treatment/Interventions ADLs/Self Care Home Management;Moist Heat;Gait training;Stair training;Functional mobility training;Therapeutic activities;Therapeutic  exercise;Balance training;Neuromuscular re-education;Patient/family education    PT Next Visit Plan .  Continue POC toward goals.    PT Home Exercise Plan 03/01/22: discussed finding and elevated sit surface for future STS exercises (something ~18-19 inches high)    Consulted and Agree with Plan of Care Patient             Patient will benefit from skilled therapeutic intervention in order to improve the following deficits and impairments:  balance, strength, coordination, gait, transfers. Posture. ROM   Visit Diagnosis: Muscle weakness (generalized)  Abnormality of gait and mobility  Difficulty in walking, not elsewhere classified  Other abnormalities of gait and mobility  Unsteadiness on feet    Problem List Patient Active Problem List   Diagnosis Date Noted   Acute respiratory failure due to COVID-19 (HCC) 05/28/2019   Alcohol dependence with uncomplicated withdrawal (HCC) 01/23/2018   Alcohol withdrawal (HCC) 01/23/2018   GIB (gastrointestinal bleeding) 12/09/2017   A-fib (HCC) 12/09/2017   Esophageal obstruction 10/10/2016   Esophageal ulceration 10/10/2016   GI bleed 09/20/2016   Low vitamin B12 level 03/18/2016   S/P kyphoplasty 03/18/2016   Basal cell carcinoma 02/08/2016   Atrial fibrillation (HCC) 01/27/2016   Colitis 01/27/2016   HLD (hyperlipidemia) 01/27/2016   Anxiety 01/27/2016   Hypokalemia 01/27/2016   Major depressive disorder, recurrent episode, moderate (HCC) 04/14/2015   Anxiety, generalized 12/06/2013   Parkinson disease 08/07/2013   Dysphagia 05/12/2013   Esophageal mass 05/12/2013   GERD (gastroesophageal reflux disease) 05/12/2013   Vomiting 05/12/2013   Open bite of lower leg 05/10/2013   Dog bite of lower leg 05/10/2013   Abnormal finding on liver function 03/25/2013   Benign neoplasm 03/25/2013   Dizziness 03/25/2013   Type 2 diabetes mellitus (  HCC) 03/25/2013   BP (high blood pressure) 03/25/2013   Awareness of heartbeats  03/25/2013   Pure hypercholesterolemia 03/25/2013   Infective tonsillitis 03/25/2013   Adenomatous polyp 03/25/2013      Norman Herrlich PT ,DPT Physical Therapist- [place]  [facility]

## 2022-08-11 ENCOUNTER — Ambulatory Visit: Payer: Medicare Other | Admitting: Physical Therapy

## 2022-08-11 ENCOUNTER — Ambulatory Visit: Payer: Medicare Other

## 2022-08-16 ENCOUNTER — Ambulatory Visit: Payer: Medicare Other | Admitting: Physical Therapy

## 2022-08-16 ENCOUNTER — Ambulatory Visit: Payer: Medicare Other | Admitting: Occupational Therapy

## 2022-08-18 ENCOUNTER — Ambulatory Visit: Payer: Medicare Other | Admitting: Occupational Therapy

## 2022-08-18 ENCOUNTER — Ambulatory Visit: Payer: Medicare Other

## 2022-08-23 ENCOUNTER — Ambulatory Visit: Payer: Medicare Other | Admitting: Occupational Therapy

## 2022-08-23 ENCOUNTER — Ambulatory Visit: Payer: Medicare Other | Admitting: Physical Therapy

## 2022-08-23 DIAGNOSIS — M6281 Muscle weakness (generalized): Secondary | ICD-10-CM | POA: Diagnosis not present

## 2022-08-23 DIAGNOSIS — R269 Unspecified abnormalities of gait and mobility: Secondary | ICD-10-CM

## 2022-08-23 DIAGNOSIS — R2681 Unsteadiness on feet: Secondary | ICD-10-CM

## 2022-08-23 DIAGNOSIS — R2689 Other abnormalities of gait and mobility: Secondary | ICD-10-CM

## 2022-08-23 DIAGNOSIS — R278 Other lack of coordination: Secondary | ICD-10-CM

## 2022-08-23 DIAGNOSIS — R262 Difficulty in walking, not elsewhere classified: Secondary | ICD-10-CM

## 2022-08-23 NOTE — Therapy (Signed)
OUTPATIENT OCCUPATIONAL THERAPY NEURO TREATMENT/RECERTIFICATION NOTE  Patient Name: Charles Stanton MRN: [id number] DOB:03-06-1955, 67 y.o., male Today]'s Date: 08/23/2022  PCP: Dr. Barbette Reichmann REFERRING PROVIDER: Dr. Cristopher Peru  END OF SESSION:  OT End of Session - 08/23/22 1402     Visit Number 19    Number of Visits 24    Date for OT Re-Evaluation 08/18/22    OT Start Time 1400    OT Stop Time 1445    OT Time Calculation (min) 45 min    Activity Tolerance Patient tolerated treatment well    Behavior During Therapy [facility] for tasks assessed/performed             Past Medical History:  Diagnosis Date   Abnormal liver function    Anemia    Anxiety    Atrial fibrillation (HCC)    Cancer (HCC) 2017   skin   Colonic mass    Depression    Dizziness    Dry cough    History of colon polyps    Hypercholesteremia    Hyperlipemia    Hypertension    Palpitations    Parkinson's disease    Parkinson's disease    PVD (peripheral vascular disease) (HCC)    Tonsillitis 03/25/2013   Tremors of nervous system    Past Surgical History:  Procedure Laterality Date   BACK SURGERY     Kyphoplasty   March 2018   COLONOSCOPY     COLONOSCOPY WITH PROPOFOL N/A 12/12/2017   Procedure: COLONOSCOPY WITH PROPOFOL;  Surgeon: Wyline Mood, MD;  Location: [facility] ENDOSCOPY;  Service: Gastroenterology;  Laterality: N/A;   ESOPHAGOGASTRODUODENOSCOPY N/A 12/11/2017   Procedure: ESOPHAGOGASTRODUODENOSCOPY (EGD);  Surgeon: Wyline Mood, MD;  Location: [facility] ENDOSCOPY;  Service: Gastroenterology;  Laterality: N/A;   ESOPHAGOGASTRODUODENOSCOPY N/A 10/08/2021   Procedure: ESOPHAGOGASTRODUODENOSCOPY (EGD);  Surgeon: Regis Bill, MD;  Location: [facility] ENDOSCOPY;  Service: Endoscopy;  Laterality: N/A;   ESOPHAGOGASTRODUODENOSCOPY (EGD) WITH PROPOFOL N/A 09/21/2016   Procedure: ESOPHAGOGASTRODUODENOSCOPY (EGD) WITH PROPOFOL;  Surgeon: Wyline Mood, MD;  Location: [facility] ENDOSCOPY;  Service:  Gastroenterology;  Laterality: N/A;   ESOPHAGOGASTRODUODENOSCOPY (EGD) WITH PROPOFOL N/A 11/07/2016   Procedure: ESOPHAGOGASTRODUODENOSCOPY (EGD) WITH PROPOFOL;  Surgeon: Wyline Mood, MD;  Location: [facility] ENDOSCOPY;  Service: Gastroenterology;  Laterality: N/A;   ESOPHAGOGASTRODUODENOSCOPY (EGD) WITH PROPOFOL N/A 10/15/2019   Procedure: ESOPHAGOGASTRODUODENOSCOPY (EGD) WITH PROPOFOL;  Surgeon: Regis Bill, MD;  Location: [facility] ENDOSCOPY;  Service: Endoscopy;  Laterality: N/A;   ESOPHAGOGASTRODUODENOSCOPY (EGD) WITH PROPOFOL N/A 10/28/2019   Procedure: ESOPHAGOGASTRODUODENOSCOPY (EGD) WITH PROPOFOL;  Surgeon: Regis Bill, MD;  Location: [facility] ENDOSCOPY;  Service: Endoscopy;  Laterality: N/A;   ESOPHAGOGASTRODUODENOSCOPY (EGD) WITH PROPOFOL N/A 12/02/2019   Procedure: ESOPHAGOGASTRODUODENOSCOPY (EGD) WITH PROPOFOL;  Surgeon: Regis Bill, MD;  Location: [facility] ENDOSCOPY;  Service: Endoscopy;  Laterality: N/A;   ESOPHAGOGASTRODUODENOSCOPY (EGD) WITH PROPOFOL N/A 02/18/2020   Procedure: ESOPHAGOGASTRODUODENOSCOPY (EGD) WITH PROPOFOL;  Surgeon: Regis Bill, MD;  Location: [facility] ENDOSCOPY;  Service: Endoscopy;  Laterality: N/A;   ESOPHAGOGASTRODUODENOSCOPY (EGD) WITH PROPOFOL N/A 08/02/2021   Procedure: ESOPHAGOGASTRODUODENOSCOPY (EGD) WITH PROPOFOL;  Surgeon: Regis Bill, MD;  Location: [facility] ENDOSCOPY;  Service: Endoscopy;  Laterality: N/A;  REQUEST AM   ESOPHAGOGASTRODUODENOSCOPY (EGD) WITH PROPOFOL N/A 10/05/2021   Procedure: ESOPHAGOGASTRODUODENOSCOPY (EGD) WITH PROPOFOL;  Surgeon: Regis Bill, MD;  Location: [facility] ENDOSCOPY;  Service: Endoscopy;  Laterality: N/A;   FRACTURE SURGERY Right 05/28/2019   distal radius fracture   HERNIA REPAIR     left inguinal  hernia repair as an infant   KYPHOPLASTY N/A 02/11/2016   Procedure: KYPHOPLASTY  L2,T9;  Surgeon: Kennedy Bucker, MD;  Location: [facility] ORS;  Service: Orthopedics;  Laterality: N/A;   KYPHOPLASTY N/A 03/15/2016    Procedure: KYPHOPLASTY L3;  Surgeon: Kennedy Bucker, MD;  Location: [facility] ORS;  Service: Orthopedics;  Laterality: N/A;   OPEN REDUCTION INTERNAL FIXATION (ORIF) DISTAL RADIAL FRACTURE Right 05/28/2019   Procedure: OPEN REDUCTION INTERNAL FIXATION (ORIF) DISTAL RADIAL FRACTURE;  Surgeon: Kennedy Bucker, MD;  Location: [facility] ORS;  Service: Orthopedics;  Laterality: Right;   skin cancer removed     TONSILLECTOMY     Patient Active Problem List   Diagnosis Date Noted   Acute respiratory failure due to COVID-19 (HCC) 05/28/2019   Alcohol dependence with uncomplicated withdrawal (HCC) 01/23/2018   Alcohol withdrawal (HCC) 01/23/2018   GIB (gastrointestinal bleeding) 12/09/2017   A-fib (HCC) 12/09/2017   Esophageal obstruction 10/10/2016   Esophageal ulceration 10/10/2016   GI bleed 09/20/2016   Low vitamin B12 level 03/18/2016   S/P kyphoplasty 03/18/2016   Basal cell carcinoma 02/08/2016   Atrial fibrillation (HCC) 01/27/2016   Colitis 01/27/2016   HLD (hyperlipidemia) 01/27/2016   Anxiety 01/27/2016   Hypokalemia 01/27/2016   Major depressive disorder, recurrent episode, moderate (HCC) 04/14/2015   Anxiety, generalized 12/06/2013   Parkinson disease 08/07/2013   Dysphagia 05/12/2013   Esophageal mass 05/12/2013   GERD (gastroesophageal reflux disease) 05/12/2013   Vomiting 05/12/2013   Open bite of lower leg 05/10/2013   Dog bite of lower leg 05/10/2013   Abnormal finding on liver function 03/25/2013   Benign neoplasm 03/25/2013   Dizziness 03/25/2013   Type 2 diabetes mellitus (HCC) 03/25/2013   BP (high blood pressure) 03/25/2013   Awareness of heartbeats 03/25/2013   Pure hypercholesterolemia 03/25/2013   Infective tonsillitis 03/25/2013   Adenomatous polyp 03/25/2013   ONSET DATE: 10 years   REFERRING DIAG: Parkinson's Disease  THERAPY DIAG:  Muscle weakness (generalized)  Other lack of coordination  Rationale for Evaluation and Treatment: Rehabilitation  SUBJECTIVE:   SUBJECTIVE STATEMENT: Pt reports that he is worn out from PT for his first day back to therapy following vacation. Pt accompanied by: self; spouse present in lobby, but did not follow for eval  PERTINENT HISTORY: Pt has been working with outpatient PT here at [facility] since end of Feb 2024.  OT ordered d/t worsening PD symptoms contributing to functional decline with ADLs.   PRECAUTIONS: Fall  WEIGHT BEARING RESTRICTIONS: No  PAIN:  Are you having pain? No pain reported today.  FALLS: Has patient fallen in last 6 months? Yes. Number of falls 1  LIVING ENVIRONMENT: Lives with: lives with their spouse Lives in: split level but pt remains on main level to avoid steps Stairs: Yes: Internal: 2 steps; can reach both Has following equipment at home: Quad cane small base, Walker - 4 wheeled, bed side commode, Grab bars, and pull down seat for shower, hand held shower hose,   PLOF: Needs assistance with ADLs; pt reports that spouse assists but is consistent to always let pt try things first before she automatically helps.    PATIENT GOALS: To be more indep  OBJECTIVE:  HAND DOMINANCE: Right (most affected side from PD)  ADLs: Overall ADLs: spouse is primary caregiver Transfers/ambulation related to ADLs: uses RW Eating: spouse cuts food; pt reports having to use L non-dominant hand to eat for the last 5-6 yrs Grooming: set up; assist to squeeze toothpaste and denture paste;  uses L non-dominant hand UB Dressing: set up; assist with clothing fasteners LB Dressing: Min A; assist with socks, dons slip on shoes with set up (has tried a sockaid but was unsuccessful) Toileting: modified indep Bathing: Set up/min A; spouse squeezes shampoo into pt's hands, spouse helps to towel dry  Tub Shower transfers: distant supv Equipment:  see above   IADLs: Shopping: pt can accompany spouse by pushing a shopping cart.  Light housekeeping: pt can drag trash to bin and can roll the bin to the road and  back Meal Prep: spouse manages  Community mobility: spouse drives; uses rollator for longer distances  Medication management: spouse manages pills using weekly pill organizer.   Financial management: spouse manages  Handwriting: Mild micrographia and <25% legible  MOBILITY STATUS: Hx of falls  POSTURE COMMENTS:  rounded shoulders and forward head, R shoulder elevated  ACTIVITY TOLERANCE: Activity tolerance: TBD and assessed further with functional activities   FUNCTIONAL OUTCOME MEASURES: FOTO: TBD   UPPER EXTREMITY ROM:    Active ROM Right eval Left eval Right 07/12/2022 Left 07/12/2022 Right 08/28/2022 Left  08/28/2022  Shoulder flexion 118 85 120 117 124 120  Shoulder abduction 128 124 132 127 132 130  Shoulder adduction        Shoulder extension        Shoulder internal rotation [facility] WFL  Shoulder external rotation [facility] WFL  Elbow flexion        Elbow extension        Wrist flexion 55 76 59 75 59 75  Wrist extension 57 75 61 71 61 71  Wrist ulnar deviation        Wrist radial deviation        Wrist pronation [facility] WFL  Wrist supination 63 75 66 81 55 81  (Blank rows = not tested)  UPPER EXTREMITY MMT:     MMT Right eval Left eval Right 07/12/2022 Left 07/12/2022 Right 08/23/2022 Left 08/23/2022  Shoulder flexion 4 4 4+ 4+ 4+ 4+  Shoulder abduction 4+ 4+ 4+ 4+ 4+ 4+  Shoulder adduction        Shoulder extension        Shoulder internal rotation        Shoulder external rotation        Middle trapezius        Lower trapezius        Elbow flexion 4+ 4+ 4+ 4+ 5 5  Elbow extension 4+ 4+ 4+ 4+ 4+ 4+  Wrist flexion 4+ 4+ 4+ 4+ 4+ 4+  Wrist extension 4+ 4+ 4+ 4+ 4+ 4+  Wrist ulnar deviation        Wrist radial deviation        Wrist pronation        Wrist supination        (Blank rows = not tested)  HAND FUNCTION: Grip strength: Right: 16 lbs; Left: 38 lbs, Lateral pinch: Right: 10 lbs, Left: 14 lbs, and 3 point pinch:  Right: 9 lbs, Left: 11 lbs 07/12/2022: Grip strength: Right: 16 lbs; Left: 40 lbs, Lateral pinch: Right: 10 lbs, Left: 14 lbs, and 3 point pinch: Right: 9 lbs, Left: 15 lbs  08/23/2022: Grip strength: Right: 7 lbs; Left: 30 lbs, Lateral pinch: Right: 10 lbs, Left: 14 lbs, and 3 point pinch: Right: 6 lbs, Left: 14 lbs COORDINATION: 9 Hole Peg test: Right: 38 sec; Left: 40 sec 07/12/2022: 9  Hole Peg test: Right: 35 sec; Left: 36 sec 08/23/2022: 9 Hole Peg test: Right: 51 sec; Left: 39 sec SENSATION: WFL  EDEMA: None  MUSCLE TONE: R/L normal  COGNITION: Overall cognitive status: Within functional limits for tasks assessed  VISION: Subjective report: wears glasses all the time    PRAXIS: Impaired: Motor planning  OBSERVATIONS:  R SF PIP flexion contracture 105* from 5-6 years ago from a fall  TODAY'S TREATMENT:                                                                                                                              DATE: 08/23/2022:  Measurements were obtained, and goals were reviewed with the Pt.   PATIENT EDUCATION: Education details: Chartered certified accountant utensils to increase legibility of signature Person educated: Patient Education method: Explanation and Verbal cues, demo Education comprehension: verbalized understanding, demonstrated understanding  HOME EXERCISE PROGRAM: Theraputty   GOALS: Goals reviewed with patient? Yes  SHORT TERM GOALS: Target date: 10/04/2022   (6 weeks)  Pt will be indep to perform HEP for improving bilat hand strength and coordination skills. Baseline: Eval: not yet initiated 07/12/2022: Completing theraputty exercises independent at home Goal status: Met/Achieved  LONG TERM GOALS: Target date: 11/15/2022 (12 weeks)  Pt will increase FOTO score to (TBD) or better to indicate improvement in self perceived functional use of the R arm with daily tasks. Baseline: Eval: TBD 07/12/2022: TBD Goal status: Discontinued  2.  Pt will  increase R grip strength by 5 or more lbs to more easily hold and stabilize ADL supplies in the R dominant hand. Baseline: 08/23/2022:  Grip strength: Right: 7 lbs; Left: 30 lbs R grip 16 lbs; limited from PD but also by 5th digit PIP flexion contracture, as this digit can not engage in gripping (L 38 lbs) 07/12/2022: Grip strength: Right: 16 lbs; limited from PD but also by 5th digit PIP flexion contracture, as this digit still can not engage in gripping (L 40 lbs) Goal status: Ongoing  3.  Pt will improve R hand FMC/dexterity skills to be able to sign his name on medical/legal documents with 75% legibility, using adapted writing aids as needed. Baseline: 08/23/2022: Improving writing, however this is dependent on whether or not he is have a bad day with tremors. Eval: <25% legible and pt verbalizes embarrassment when attempting to sign his name on forms 07/12/2022: 75% legibility when writing his name in print, however reports he is still not satisfied with his signature looks. Goal status: Ongoing  4.  Pt will increase R FMC/GMC skills to engage the RUE into UB ADLs at least 50% of the time. Baseline:  08/23/2022: Pt. Is trying to engage his right UE more during daily ADL, and IADL tasks. Eval: Pt uses the L non-dominant arm for self care tasks. 07/12/22: Pt. Reports that he has been trying to incorporate his R hand while still using the L arm to complete all self care  tasks.  Goal status: Ongoing  5. Pt. Will improve typing skills  by 1 min.  In preparation for typing an email correspondence  Baseline:  08/23/2022: Pt. Typed I sentence in 1 min. & 27 sec. With the left hand. Pt. Typed 1 sentence in 1 min. & 40 sec. With bilateral hands. Limited typing engagement with the right hand.    Goal status: New   ASSESSMENT:  CLINICAL IMPRESSION:  Measurements were obtained, and goals were reviewed with the Pt. Pt. has made progress with bilateral shoulder flexion, and abduction. Bilateral elbow flexion has  improved. Pt. continues to present with limited BUE strength, limited grip strength, limited pinch strength, and impaired [facility] skills. A new goal was added to the POC to improve typing skills in preparation for typing an email correspondence. Pt. presents with increased tremors which limit motor control, and coordination during ADLs/IADLs pt. has been [place] with his [place] for the past couple of weeks. Pt. continues to benefit from skilled OT for increasing bilateral hand strength, improve coordination, specifically working to increase engagement of the RUE during self care tasks.    PERFORMANCE DEFICITS: in functional skills including ADLs, IADLs, coordination, dexterity, ROM, strength, flexibility, Fine motor control, Gross motor control, mobility, balance, body mechanics, endurance, decreased knowledge of use of DME, and UE functional use, cognitive skills including emotional and memory, and psychosocial skills including coping strategies, environmental adaptation, habits, and routines and behaviors.   IMPAIRMENTS: are limiting patient from ADLs, IADLs, and leisure.   CO-MORBIDITIES: has co-morbidities such as anxiety, depression  that affects occupational performance. Patient will benefit from skilled OT to address above impairments and improve overall function.  MODIFICATION OR ASSISTANCE TO COMPLETE EVALUATION: No modification of tasks or assist necessary to complete an evaluation.  OT OCCUPATIONAL PROFILE AND HISTORY: Problem focused assessment: Including review of records relating to presenting problem.  CLINICAL DECISION MAKING: Moderate - several treatment options, min-mod task modification necessary  REHAB POTENTIAL: Good for goals  EVALUATION COMPLEXITY: Moderate    PLAN:  OT FREQUENCY: 2x/week  OT DURATION: 12 weeks  PLANNED INTERVENTIONS: self care/ADL training, therapeutic exercise, therapeutic activity, neuromuscular re-education, manual therapy, passive  range of motion, balance training, functional mobility training, moist heat, cryotherapy, patient/family education, cognitive remediation/compensation, psychosocial skills training, energy conservation, coping strategies training, and DME and/or AE instructions RECOMMENDED OTHER SERVICES: None at this time  CONSULTED AND AGREED WITH PLAN OF CARE: Patient  PLAN FOR NEXT SESSION: handwriting  Olegario Messier, M.S. OTR/L  08/23/22, 2:04 PM

## 2022-08-23 NOTE — Therapy (Signed)
Outpatient Physical Therapy Treatment    Patient Details  Name: Charles Stanton MRN: [id number] Date of Birth: September 04, 1955 Referring Provider (PT): Dr. Cristopher Peru   Encounter Date: 08/23/2022  END OF SESSION:    PT End of Session - 08/23/22 1319     Visit Number 45    Number of Visits 62    Date for PT Re-Evaluation 10/11/22    Authorization Type Medicare A & B primary; federal employee secondary    Authorization Time Period -10/11/22    Progress Note Due on Visit 50    PT Start Time 1317    PT Stop Time 1359    PT Time Calculation (min) 42 min    Equipment Utilized During Treatment Gait belt    Activity Tolerance Patient tolerated treatment well;No increased pain    Behavior During Therapy [facility] for tasks assessed/performed                        Past Medical History:  Diagnosis Date   Abnormal liver function    Anemia    Anxiety    Atrial fibrillation (HCC)    Cancer (HCC) 2017   skin   Colonic mass    Depression    Dizziness    Dry cough    History of colon polyps    Hypercholesteremia    Hyperlipemia    Hypertension    Palpitations    Parkinson's disease    Parkinson's disease    PVD (peripheral vascular disease) (HCC)    Tonsillitis 03/25/2013   Tremors of nervous system     Past Surgical History:  Procedure Laterality Date   BACK SURGERY     Kyphoplasty   March 2018   COLONOSCOPY     COLONOSCOPY WITH PROPOFOL N/A 12/12/2017   Procedure: COLONOSCOPY WITH PROPOFOL;  Surgeon: Wyline Mood, MD;  Location: [facility] ENDOSCOPY;  Service: Gastroenterology;  Laterality: N/A;   ESOPHAGOGASTRODUODENOSCOPY N/A 12/11/2017   Procedure: ESOPHAGOGASTRODUODENOSCOPY (EGD);  Surgeon: Wyline Mood, MD;  Location: [facility] ENDOSCOPY;  Service: Gastroenterology;  Laterality: N/A;   ESOPHAGOGASTRODUODENOSCOPY N/A 10/08/2021   Procedure: ESOPHAGOGASTRODUODENOSCOPY (EGD);  Surgeon: Regis Bill, MD;  Location: [facility] ENDOSCOPY;  Service: Endoscopy;  Laterality: N/A;    ESOPHAGOGASTRODUODENOSCOPY (EGD) WITH PROPOFOL N/A 09/21/2016   Procedure: ESOPHAGOGASTRODUODENOSCOPY (EGD) WITH PROPOFOL;  Surgeon: Wyline Mood, MD;  Location: [facility] ENDOSCOPY;  Service: Gastroenterology;  Laterality: N/A;   ESOPHAGOGASTRODUODENOSCOPY (EGD) WITH PROPOFOL N/A 11/07/2016   Procedure: ESOPHAGOGASTRODUODENOSCOPY (EGD) WITH PROPOFOL;  Surgeon: Wyline Mood, MD;  Location: [facility] ENDOSCOPY;  Service: Gastroenterology;  Laterality: N/A;   ESOPHAGOGASTRODUODENOSCOPY (EGD) WITH PROPOFOL N/A 10/15/2019   Procedure: ESOPHAGOGASTRODUODENOSCOPY (EGD) WITH PROPOFOL;  Surgeon: Regis Bill, MD;  Location: [facility] ENDOSCOPY;  Service: Endoscopy;  Laterality: N/A;   ESOPHAGOGASTRODUODENOSCOPY (EGD) WITH PROPOFOL N/A 10/28/2019   Procedure: ESOPHAGOGASTRODUODENOSCOPY (EGD) WITH PROPOFOL;  Surgeon: Regis Bill, MD;  Location: [facility] ENDOSCOPY;  Service: Endoscopy;  Laterality: N/A;   ESOPHAGOGASTRODUODENOSCOPY (EGD) WITH PROPOFOL N/A 12/02/2019   Procedure: ESOPHAGOGASTRODUODENOSCOPY (EGD) WITH PROPOFOL;  Surgeon: Regis Bill, MD;  Location: [facility] ENDOSCOPY;  Service: Endoscopy;  Laterality: N/A;   ESOPHAGOGASTRODUODENOSCOPY (EGD) WITH PROPOFOL N/A 02/18/2020   Procedure: ESOPHAGOGASTRODUODENOSCOPY (EGD) WITH PROPOFOL;  Surgeon: Regis Bill, MD;  Location: [facility] ENDOSCOPY;  Service: Endoscopy;  Laterality: N/A;   ESOPHAGOGASTRODUODENOSCOPY (EGD) WITH PROPOFOL N/A 08/02/2021   Procedure: ESOPHAGOGASTRODUODENOSCOPY (EGD) WITH PROPOFOL;  Surgeon: Regis Bill, MD;  Location: [facility] ENDOSCOPY;  Service: Endoscopy;  Laterality: N/A;  REQUEST AM   ESOPHAGOGASTRODUODENOSCOPY (EGD) WITH PROPOFOL N/A 10/05/2021   Procedure: ESOPHAGOGASTRODUODENOSCOPY (EGD) WITH PROPOFOL;  Surgeon: Regis Bill, MD;  Location: [facility] ENDOSCOPY;  Service: Endoscopy;  Laterality: N/A;   FRACTURE SURGERY Right 05/28/2019   distal radius fracture   HERNIA REPAIR     left inguinal hernia repair as an  infant   KYPHOPLASTY N/A 02/11/2016   Procedure: KYPHOPLASTY  L2,T9;  Surgeon: Kennedy Bucker, MD;  Location: [facility] ORS;  Service: Orthopedics;  Laterality: N/A;   KYPHOPLASTY N/A 03/15/2016   Procedure: KYPHOPLASTY L3;  Surgeon: Kennedy Bucker, MD;  Location: [facility] ORS;  Service: Orthopedics;  Laterality: N/A;   OPEN REDUCTION INTERNAL FIXATION (ORIF) DISTAL RADIAL FRACTURE Right 05/28/2019   Procedure: OPEN REDUCTION INTERNAL FIXATION (ORIF) DISTAL RADIAL FRACTURE;  Surgeon: Kennedy Bucker, MD;  Location: [facility] ORS;  Service: Orthopedics;  Laterality: Right;   skin cancer removed     TONSILLECTOMY       Subjective Assessment -      Subjective  Pt reports doing well today. Pt reports his trip to the beach went well.      Pertinent History Charles Stanton is a 66yoM who was prescribed with Parkinson's Disease ~ 10 years.  Pt is followed by neurology, reports good reponse to medications which help control his tremors somewhat. Pt uses a SPC for AMB, also uses a walker for longer distance walking. Pt goes into community almost daily (they like to eat out) which includes lots of SPC [place] eand navigation of 4 stairs at home.    Currently in Pain? No/denies             TREATMENT:  Unless otherwise stated, CGA was provided and gait belt donned in order to ensure pt safety   TE:   [employer] fitness level 3 x 6 min for aerobic priming   NMR  PWR! Seated up, twist, rock and step for warm up of postural and truncal muscles. X 10 ea    Ambulation with SPC and with metronome at 56 BPM ( every other step with metronome) to outdoor area of healing garden and ambulation around healing garden. Pt challenged with standing PWR! Moves outdoors with UE support on armchairs outside -standing PWR! Up, rock, twist and step modified with UE support  Seated rest break x 2 ambulation around 500-600 ft between each repetition   -PWR! Up works on postural strengthening and antigravity extension, PRW! Rock working on  [employer] shifting, PRW! Twist targeting trunk rotation and PRW! Step targeting transition movements. PWR! Moves target bradykinesia, rigidity, and dyskinesia through targeted functional movements that address four core movement difficulties for people with Parkinson's disease.  TE STS x 10 with cues for forward trunk lean initially, no UE assist      PT Education -     Education Details Pt educated throughout session about proper posture and technique with exercises. Improved exercise technique, movement at target joints, use of target muscles after min to mod verbal, visual, tactile cues.      Person(s) Educated Patient    Methods Explanation;Demonstration ; handout   Comprehension Verbalized understanding;Returned demonstration;Need further instruction           PT Short Term Goals -       PT SHORT TERM GOAL #1   Title Pt to report regular performance of exercises prescribed for home and a sense of improvements in strength and balance AEB them not being as challenging anymore.    Baseline Will issue  on visit 2    Time 4    Period Weeks    Status met   Target Date 03/15/22      PT SHORT TERM GOAL #2   Title Pt to demonstrate improved power AEB improved 5xSTS hands free in <18sec.    Baseline 03/01/22: 22sec hands free, author provideing foot block bilat 03/31/22: 14.82sec 4/23:17.3 sec   Time 4    Period Weeks    Status Met    Target Date 03/29/22               PT Long Term Goals - Target goal date for all remaining long term goals is 07/19/2022        PT LONG TERM GOAL #1   Title Pt to improve score on FOTO survey to 57 to indicate reduced difficulty with basic mobility required for ADL performance.    Baseline 03/01/22:49 04/29/22:56%. 06/16/22: 60   Time 8    Period Weeks    Status Met          PT LONG TERM GOAL #2   Title Pt to improve tolerance to overground AMB c SPC AEB performance >858ft.    Baseline 2/27: fatigued after 457ft lap  performed in four minutes ten seconds. 03/31/22: 737ft with SPC no rest break 04/26/22: 794 ft 06/16/22: 903 ft with SPC, no rest.   Time 8    Period Weeks    Status Met         PT LONG TERM GOAL #3   Title Pt to demonstrate improved leg power AEB 5xSTS from chair <14sec hands free and without need for minGuard assist for safety and without need for foot block.    Baseline 03/01/22: 22sec hands free, feet blocked, minGuard assist  03/31/22: 14.82sec  4/23:17.3 sec 06/16/22: 13.87 sec 7/16: 11.55 sec   Time 8     Period Weeks    Status MET         PT LONG TERM GOAL #4   Title Pt to demonstrate improvement in balance performance AEB >6 improvement BERG Balance for balance assessment.    Baseline 41 on 03/03/22. 3/38/24: 42 4/23: 45 06/16/22: 48   Time 8    Period Weeks    Status Met          5.  Pt will improve distance with LRAD to 900 feet or greater in order to indicate further progress to community ambulation distances Baseline: 794 ft with SPC , 06/16/22: 903 ft with SPC. 07/19/22: 905 feet Goal status: Met  6.  Patient will improve modified DGI (with use of cane) score > 4 points or greater in order to indicate improved balance and decreased risk of falls.  Baseline: 6/18: 11 7/16: 14  Goal status: IN PROGRESS  7. Patient will be able to withstand various light perturbations while preforming ambulation with LRAD utilizing safe balance strategies in order to simulate safe and confident walking in crowded places. Baseline: Pt currently does not feel safe preforming task, could walk through cafeteria to get baseline. 7/23: 70% confidence with activity, mild LOB with perturbations.  Goal Status: IN PROGRESS  8. Pt will be compliant and independent with Parkinson's based HEP by reciting and demonstrating full PWR! Move sets in seated and or standing with no cueing from therapist. Baseline: Pt currently needs demonstration or verbal cueing for proper completion  7/23: cues required for  proper completion with PWR! Up, rock, and twist in seated position  Goal  Status: IN PROGRESS     Plan     Clinical Impression Statement Continued with current plan of care as laid out in evaluation and recent prior sessions. Pt remains motivated to advance progress toward goals in order to maximize independence and safety at home. Pt requires high level assistance and cuing for completion of exercises in order to provide adequate level of stimulation and perturbation. Pt shows increase in fatigue compared to prior to vacation, encouraged to ensure he completed HEP daily to prevent any further loss of functional gais due to inactivity.  Pt will continue to benefit from skilled physical therapy intervention to address impairments, improve QOL, and attain therapy goals.      Personal Factors and Comorbidities Time since onset of injury/illness/exacerbation;Past/Current Experience    Examination-Activity Limitations Locomotion Level;Transfers;Bed Mobility;Reach Overhead;Hygiene/Grooming;Dressing;Toileting;Stairs;Bend    Stability/Clinical Decision Making Evolving/Moderate complexity    Clinical Decision Making Moderate    Rehab Potential Good    PT Frequency 2x / week    PT Duration 8 weeks    PT Treatment/Interventions ADLs/Self Care Home Management;Moist Heat;Gait training;Stair training;Functional mobility training;Therapeutic activities;Therapeutic exercise;Balance training;Neuromuscular re-education;Patient/family education    PT Next Visit Plan .  Continue POC toward goals.    PT Home Exercise Plan 03/01/22: discussed finding and elevated sit surface for future STS exercises (something ~18-19 inches high)    Consulted and Agree with Plan of Care Patient             Patient will benefit from skilled therapeutic intervention in order to improve the following deficits and impairments:  balance, strength, coordination, gait, transfers. Posture. ROM   Visit Diagnosis: Muscle weakness  (generalized)  Abnormality of gait and mobility  Difficulty in walking, not elsewhere classified  Other abnormalities of gait and mobility  Unsteadiness on feet    Problem List Patient Active Problem List   Diagnosis Date Noted   Acute respiratory failure due to COVID-19 (HCC) 05/28/2019   Alcohol dependence with uncomplicated withdrawal (HCC) 01/23/2018   Alcohol withdrawal (HCC) 01/23/2018   GIB (gastrointestinal bleeding) 12/09/2017   A-fib (HCC) 12/09/2017   Esophageal obstruction 10/10/2016   Esophageal ulceration 10/10/2016   GI bleed 09/20/2016   Low vitamin B12 level 03/18/2016   S/P kyphoplasty 03/18/2016   Basal cell carcinoma 02/08/2016   Atrial fibrillation (HCC) 01/27/2016   Colitis 01/27/2016   HLD (hyperlipidemia) 01/27/2016   Anxiety 01/27/2016   Hypokalemia 01/27/2016   Major depressive disorder, recurrent episode, moderate (HCC) 04/14/2015   Anxiety, generalized 12/06/2013   Parkinson disease 08/07/2013   Dysphagia 05/12/2013   Esophageal mass 05/12/2013   GERD (gastroesophageal reflux disease) 05/12/2013   Vomiting 05/12/2013   Open bite of lower leg 05/10/2013   Dog bite of lower leg 05/10/2013   Abnormal finding on liver function 03/25/2013   Benign neoplasm 03/25/2013   Dizziness 03/25/2013   Type 2 diabetes mellitus (HCC) 03/25/2013   BP (high blood pressure) 03/25/2013   Awareness of heartbeats 03/25/2013   Pure hypercholesterolemia 03/25/2013   Infective tonsillitis 03/25/2013   Adenomatous polyp 03/25/2013      Norman Herrlich PT ,DPT Physical Therapist- [place]  [facility]

## 2022-08-25 ENCOUNTER — Ambulatory Visit: Payer: Medicare Other | Admitting: Physical Therapy

## 2022-08-25 ENCOUNTER — Ambulatory Visit: Payer: Medicare Other | Admitting: Occupational Therapy

## 2022-08-25 ENCOUNTER — Encounter: Payer: Self-pay | Admitting: Physical Therapy

## 2022-08-25 DIAGNOSIS — R2689 Other abnormalities of gait and mobility: Secondary | ICD-10-CM

## 2022-08-25 DIAGNOSIS — M6281 Muscle weakness (generalized): Secondary | ICD-10-CM | POA: Diagnosis not present

## 2022-08-25 DIAGNOSIS — R269 Unspecified abnormalities of gait and mobility: Secondary | ICD-10-CM

## 2022-08-25 DIAGNOSIS — R278 Other lack of coordination: Secondary | ICD-10-CM

## 2022-08-25 DIAGNOSIS — R262 Difficulty in walking, not elsewhere classified: Secondary | ICD-10-CM

## 2022-08-25 DIAGNOSIS — R2681 Unsteadiness on feet: Secondary | ICD-10-CM

## 2022-08-25 NOTE — Therapy (Signed)
Outpatient Physical Therapy Treatment    Patient Details  Name: Charles Stanton MRN: [id number] Date of Birth: 11-07-1955 Referring Provider (PT): Dr. Cristopher Stanton   Encounter Date: 08/25/2022  END OF SESSION:    PT End of Session - 08/25/22 1311     Visit Number 46    Number of Visits 62    Date for PT Re-Evaluation 10/11/22    Authorization Type Medicare A & B primary; federal employee secondary    Authorization Time Period -10/11/22    Progress Note Due on Visit 50    PT Start Time 1315    PT Stop Time 1359    PT Time Calculation (min) 44 min    Equipment Utilized During Treatment Gait belt    Activity Tolerance Patient tolerated treatment well;No increased pain    Behavior During Therapy [facility] for tasks assessed/performed                         Past Medical History:  Diagnosis Date   Abnormal liver function    Anemia    Anxiety    Atrial fibrillation (HCC)    Cancer (HCC) 2017   skin   Colonic mass    Depression    Dizziness    Dry cough    History of colon polyps    Hypercholesteremia    Hyperlipemia    Hypertension    Palpitations    Parkinson's disease    Parkinson's disease    PVD (peripheral vascular disease) (HCC)    Tonsillitis 03/25/2013   Tremors of nervous system     Past Surgical History:  Procedure Laterality Date   BACK SURGERY     Kyphoplasty   March 2018   COLONOSCOPY     COLONOSCOPY WITH PROPOFOL N/A 12/12/2017   Procedure: COLONOSCOPY WITH PROPOFOL;  Surgeon: Charles Mood, MD;  Location: [facility] ENDOSCOPY;  Service: Gastroenterology;  Laterality: N/A;   ESOPHAGOGASTRODUODENOSCOPY N/A 12/11/2017   Procedure: ESOPHAGOGASTRODUODENOSCOPY (EGD);  Surgeon: Charles Mood, MD;  Location: [facility] ENDOSCOPY;  Service: Gastroenterology;  Laterality: N/A;   ESOPHAGOGASTRODUODENOSCOPY N/A 10/08/2021   Procedure: ESOPHAGOGASTRODUODENOSCOPY (EGD);  Surgeon: Charles Bill, MD;  Location: [facility] ENDOSCOPY;  Service: Endoscopy;  Laterality:  N/A;   ESOPHAGOGASTRODUODENOSCOPY (EGD) WITH PROPOFOL N/A 09/21/2016   Procedure: ESOPHAGOGASTRODUODENOSCOPY (EGD) WITH PROPOFOL;  Surgeon: Charles Mood, MD;  Location: [facility] ENDOSCOPY;  Service: Gastroenterology;  Laterality: N/A;   ESOPHAGOGASTRODUODENOSCOPY (EGD) WITH PROPOFOL N/A 11/07/2016   Procedure: ESOPHAGOGASTRODUODENOSCOPY (EGD) WITH PROPOFOL;  Surgeon: Charles Mood, MD;  Location: [facility] ENDOSCOPY;  Service: Gastroenterology;  Laterality: N/A;   ESOPHAGOGASTRODUODENOSCOPY (EGD) WITH PROPOFOL N/A 10/15/2019   Procedure: ESOPHAGOGASTRODUODENOSCOPY (EGD) WITH PROPOFOL;  Surgeon: Charles Bill, MD;  Location: [facility] ENDOSCOPY;  Service: Endoscopy;  Laterality: N/A;   ESOPHAGOGASTRODUODENOSCOPY (EGD) WITH PROPOFOL N/A 10/28/2019   Procedure: ESOPHAGOGASTRODUODENOSCOPY (EGD) WITH PROPOFOL;  Surgeon: Charles Bill, MD;  Location: [facility] ENDOSCOPY;  Service: Endoscopy;  Laterality: N/A;   ESOPHAGOGASTRODUODENOSCOPY (EGD) WITH PROPOFOL N/A 12/02/2019   Procedure: ESOPHAGOGASTRODUODENOSCOPY (EGD) WITH PROPOFOL;  Surgeon: Charles Bill, MD;  Location: [facility] ENDOSCOPY;  Service: Endoscopy;  Laterality: N/A;   ESOPHAGOGASTRODUODENOSCOPY (EGD) WITH PROPOFOL N/A 02/18/2020   Procedure: ESOPHAGOGASTRODUODENOSCOPY (EGD) WITH PROPOFOL;  Surgeon: Charles Bill, MD;  Location: [facility] ENDOSCOPY;  Service: Endoscopy;  Laterality: N/A;   ESOPHAGOGASTRODUODENOSCOPY (EGD) WITH PROPOFOL N/A 08/02/2021   Procedure: ESOPHAGOGASTRODUODENOSCOPY (EGD) WITH PROPOFOL;  Surgeon: Charles Bill, MD;  Location: [facility] ENDOSCOPY;  Service: Endoscopy;  Laterality:  N/A;  REQUEST AM   ESOPHAGOGASTRODUODENOSCOPY (EGD) WITH PROPOFOL N/A 10/05/2021   Procedure: ESOPHAGOGASTRODUODENOSCOPY (EGD) WITH PROPOFOL;  Surgeon: Charles Bill, MD;  Location: [facility] ENDOSCOPY;  Service: Endoscopy;  Laterality: N/A;   FRACTURE SURGERY Right 05/28/2019   distal radius fracture   HERNIA REPAIR     left inguinal hernia repair as  an infant   KYPHOPLASTY N/A 02/11/2016   Procedure: KYPHOPLASTY  L2,T9;  Surgeon: Charles Bucker, MD;  Location: [facility] ORS;  Service: Orthopedics;  Laterality: N/A;   KYPHOPLASTY N/A 03/15/2016   Procedure: KYPHOPLASTY L3;  Surgeon: Charles Bucker, MD;  Location: [facility] ORS;  Service: Orthopedics;  Laterality: N/A;   OPEN REDUCTION INTERNAL FIXATION (ORIF) DISTAL RADIAL FRACTURE Right 05/28/2019   Procedure: OPEN REDUCTION INTERNAL FIXATION (ORIF) DISTAL RADIAL FRACTURE;  Surgeon: Charles Bucker, MD;  Location: [facility] ORS;  Service: Orthopedics;  Laterality: Right;   skin cancer removed     TONSILLECTOMY       Subjective Assessment -      Subjective  Pt reports doing well today. Reports having trouble with Bowels again and was encouraged .      Pertinent History Charles Stanton is a 66yoM who was prescribed with Parkinson's Disease ~ 10 years.  Pt is followed by neurology, reports good reponse to medications which help control his tremors somewhat. Pt uses a SPC for AMB, also uses a walker for longer distance walking. Pt goes into community almost daily (they like to eat out) which includes lots of SPC [place] eand navigation of 4 stairs at home.    Currently in Pain? No/denies             TREATMENT:  Unless otherwise stated, CGA was provided and gait belt donned in order to ensure pt safety   TE:   [employer] fitness level 3 x 6 min for aerobic priming   NMR    Ambulation with SPC and with metronome at 56 BPM ( every other step with metronome) to outdoor area of healing garden and ambulation around healing garden. Pt challenged with standing PWR! Moves outdoors with UE support on armchairs outside -standing PWR! Up, rock, twist and step modified with UE support  Seated rest break x 2 ambulation around 500-600 ft between each repetition  PWR! Seated up, twist, rock and step for warm up of postural and truncal muscles. X 10 ea   -PWR! Up works on postural strengthening and antigravity extension, PRW!  Rock working on [employer] shifting, PRW! Twist targeting trunk rotation and PRW! Step targeting transition movements. PWR! Moves target bradykinesia, rigidity, and dyskinesia through targeted functional movements that address four core movement difficulties for people with Parkinson's disease.  STS with PWR! Step when transitioned to standing x 5 rounds      PT Education -     Education Details Pt educated throughout session about proper posture and technique with exercises. Improved exercise technique, movement at target joints, use of target muscles after min to mod verbal, visual, tactile cues.      Person(s) Educated Patient    Methods Explanation;Demonstration ; handout   Comprehension Verbalized understanding;Returned demonstration;Need further instruction           PT Short Term Goals -       PT SHORT TERM GOAL #1   Title Pt to report regular performance of exercises prescribed for home and a sense of improvements in strength and balance AEB them not being as challenging anymore.    Baseline Will issue  on visit 2    Time 4    Period Weeks    Status met   Target Date 03/15/22      PT SHORT TERM GOAL #2   Title Pt to demonstrate improved power AEB improved 5xSTS hands free in <18sec.    Baseline 03/01/22: 22sec hands free, author provideing foot block bilat 03/31/22: 14.82sec 4/23:17.3 sec   Time 4    Period Weeks    Status Met    Target Date 03/29/22               PT Long Term Goals - Target goal date for all remaining long term goals is 07/19/2022        PT LONG TERM GOAL #1   Title Pt to improve score on FOTO survey to 57 to indicate reduced difficulty with basic mobility required for ADL performance.    Baseline 03/01/22:49 04/29/22:56%. 06/16/22: 60   Time 8    Period Weeks    Status Met          PT LONG TERM GOAL #2   Title Pt to improve tolerance to overground AMB c SPC AEB performance >857ft.    Baseline 2/27: fatigued after 429ft lap  performed in four minutes ten seconds. 03/31/22: 742ft with SPC no rest break 04/26/22: 794 ft 06/16/22: 903 ft with SPC, no rest.   Time 8    Period Weeks    Status Met         PT LONG TERM GOAL #3   Title Pt to demonstrate improved leg power AEB 5xSTS from chair <14sec hands free and without need for minGuard assist for safety and without need for foot block.    Baseline 03/01/22: 22sec hands free, feet blocked, minGuard assist  03/31/22: 14.82sec  4/23:17.3 sec 06/16/22: 13.87 sec 7/16: 11.55 sec   Time 8     Period Weeks    Status MET         PT LONG TERM GOAL #4   Title Pt to demonstrate improvement in balance performance AEB >6 improvement BERG Balance for balance assessment.    Baseline 41 on 03/03/22. 3/38/24: 42 4/23: 45 06/16/22: 48   Time 8    Period Weeks    Status Met          5.  Pt will improve distance with LRAD to 900 feet or greater in order to indicate further progress to community ambulation distances Baseline: 794 ft with SPC , 06/16/22: 903 ft with SPC. 07/19/22: 905 feet Goal status: Met  6.  Patient will improve modified DGI (with use of cane) score > 4 points or greater in order to indicate improved balance and decreased risk of falls.  Baseline: 6/18: 11 7/16: 14  Goal status: IN PROGRESS  7. Patient will be able to withstand various light perturbations while preforming ambulation with LRAD utilizing safe balance strategies in order to simulate safe and confident walking in crowded places. Baseline: Pt currently does not feel safe preforming task, could walk through cafeteria to get baseline. 7/23: 70% confidence with activity, mild LOB with perturbations.  Goal Status: IN PROGRESS  8. Pt will be compliant and independent with Parkinson's based HEP by reciting and demonstrating full PWR! Move sets in seated and or standing with no cueing from therapist. Baseline: Pt currently needs demonstration or verbal cueing for proper completion  7/23: cues required for  proper completion with PWR! Up, rock, and twist in seated position  Goal  Status: IN PROGRESS     Plan     Clinical Impression Statement Continued with current plan of care as laid out in evaluation and recent prior sessions. Pt remains motivated to advance progress toward goals in order to maximize independence and safety at home. . Pt showing improved fatigue compared to previous visit, reports walking in his driveway in the morning may have helped with this. PT continues to encourage physical activity as pt is able and encourages Pt to perform PD related exercises as regularly as possible.  Pt will continue to benefit from skilled physical therapy intervention to address impairments, improve QOL, and attain therapy goals.      Personal Factors and Comorbidities Time since onset of injury/illness/exacerbation;Past/Current Experience    Examination-Activity Limitations Locomotion Level;Transfers;Bed Mobility;Reach Overhead;Hygiene/Grooming;Dressing;Toileting;Stairs;Bend    Stability/Clinical Decision Making Evolving/Moderate complexity    Clinical Decision Making Moderate    Rehab Potential Good    PT Frequency 2x / week    PT Duration 8 weeks    PT Treatment/Interventions ADLs/Self Care Home Management;Moist Heat;Gait training;Stair training;Functional mobility training;Therapeutic activities;Therapeutic exercise;Balance training;Neuromuscular re-education;Patient/family education    PT Next Visit Plan .  Continue POC toward goals.    PT Home Exercise Plan 03/01/22: discussed finding and elevated sit surface for future STS exercises (something ~18-19 inches high)    Consulted and Agree with Plan of Care Patient             Patient will benefit from skilled therapeutic intervention in order to improve the following deficits and impairments:  balance, strength, coordination, gait, transfers. Posture. ROM   Visit Diagnosis: Muscle weakness (generalized)  Abnormality of gait and  mobility  Difficulty in walking, not elsewhere classified  Other abnormalities of gait and mobility  Unsteadiness on feet    Problem List Patient Active Problem List   Diagnosis Date Noted   Acute respiratory failure due to COVID-19 (HCC) 05/28/2019   Alcohol dependence with uncomplicated withdrawal (HCC) 01/23/2018   Alcohol withdrawal (HCC) 01/23/2018   GIB (gastrointestinal bleeding) 12/09/2017   A-fib (HCC) 12/09/2017   Esophageal obstruction 10/10/2016   Esophageal ulceration 10/10/2016   GI bleed 09/20/2016   Low vitamin B12 level 03/18/2016   S/P kyphoplasty 03/18/2016   Basal cell carcinoma 02/08/2016   Atrial fibrillation (HCC) 01/27/2016   Colitis 01/27/2016   HLD (hyperlipidemia) 01/27/2016   Anxiety 01/27/2016   Hypokalemia 01/27/2016   Major depressive disorder, recurrent episode, moderate (HCC) 04/14/2015   Anxiety, generalized 12/06/2013   Parkinson disease 08/07/2013   Dysphagia 05/12/2013   Esophageal mass 05/12/2013   GERD (gastroesophageal reflux disease) 05/12/2013   Vomiting 05/12/2013   Open bite of lower leg 05/10/2013   Dog bite of lower leg 05/10/2013   Abnormal finding on liver function 03/25/2013   Benign neoplasm 03/25/2013   Dizziness 03/25/2013   Type 2 diabetes mellitus (HCC) 03/25/2013   BP (high blood pressure) 03/25/2013   Awareness of heartbeats 03/25/2013   Pure hypercholesterolemia 03/25/2013   Infective tonsillitis 03/25/2013   Adenomatous polyp 03/25/2013      Norman Herrlich PT ,DPT Physical Therapist- [place]  [facility]

## 2022-08-25 NOTE — Therapy (Signed)
Occupational Therapy Progress Note  Dates of reporting period  07/12/2022   to   08/25/2022   Patient Name: Charles Stanton MRN: [id number] DOB:1955-11-21, 67 y.o., male Today]'s Date: 08/25/2022  PCP: Dr. Barbette Reichmann REFERRING PROVIDER: Dr. Cristopher Peru  END OF SESSION:  OT End of Session - 08/25/22 1427     Visit Number 20    Number of Visits 24    Date for OT Re-Evaluation 11/15/22    OT Start Time 1400    OT Stop Time 1445    OT Time Calculation (min) 45 min    Activity Tolerance Patient tolerated treatment well    Behavior During Therapy [facility] for tasks assessed/performed             Past Medical History:  Diagnosis Date   Abnormal liver function    Anemia    Anxiety    Atrial fibrillation (HCC)    Cancer (HCC) 2017   skin   Colonic mass    Depression    Dizziness    Dry cough    History of colon polyps    Hypercholesteremia    Hyperlipemia    Hypertension    Palpitations    Parkinson's disease    Parkinson's disease    PVD (peripheral vascular disease) (HCC)    Tonsillitis 03/25/2013   Tremors of nervous system    Past Surgical History:  Procedure Laterality Date   BACK SURGERY     Kyphoplasty   March 2018   COLONOSCOPY     COLONOSCOPY WITH PROPOFOL N/A 12/12/2017   Procedure: COLONOSCOPY WITH PROPOFOL;  Surgeon: Wyline Mood, MD;  Location: [facility] ENDOSCOPY;  Service: Gastroenterology;  Laterality: N/A;   ESOPHAGOGASTRODUODENOSCOPY N/A 12/11/2017   Procedure: ESOPHAGOGASTRODUODENOSCOPY (EGD);  Surgeon: Wyline Mood, MD;  Location: [facility] ENDOSCOPY;  Service: Gastroenterology;  Laterality: N/A;   ESOPHAGOGASTRODUODENOSCOPY N/A 10/08/2021   Procedure: ESOPHAGOGASTRODUODENOSCOPY (EGD);  Surgeon: Regis Bill, MD;  Location: [facility] ENDOSCOPY;  Service: Endoscopy;  Laterality: N/A;   ESOPHAGOGASTRODUODENOSCOPY (EGD) WITH PROPOFOL N/A 09/21/2016   Procedure: ESOPHAGOGASTRODUODENOSCOPY (EGD) WITH PROPOFOL;  Surgeon: Wyline Mood, MD;  Location: [facility] ENDOSCOPY;   Service: Gastroenterology;  Laterality: N/A;   ESOPHAGOGASTRODUODENOSCOPY (EGD) WITH PROPOFOL N/A 11/07/2016   Procedure: ESOPHAGOGASTRODUODENOSCOPY (EGD) WITH PROPOFOL;  Surgeon: Wyline Mood, MD;  Location: [facility] ENDOSCOPY;  Service: Gastroenterology;  Laterality: N/A;   ESOPHAGOGASTRODUODENOSCOPY (EGD) WITH PROPOFOL N/A 10/15/2019   Procedure: ESOPHAGOGASTRODUODENOSCOPY (EGD) WITH PROPOFOL;  Surgeon: Regis Bill, MD;  Location: [facility] ENDOSCOPY;  Service: Endoscopy;  Laterality: N/A;   ESOPHAGOGASTRODUODENOSCOPY (EGD) WITH PROPOFOL N/A 10/28/2019   Procedure: ESOPHAGOGASTRODUODENOSCOPY (EGD) WITH PROPOFOL;  Surgeon: Regis Bill, MD;  Location: [facility] ENDOSCOPY;  Service: Endoscopy;  Laterality: N/A;   ESOPHAGOGASTRODUODENOSCOPY (EGD) WITH PROPOFOL N/A 12/02/2019   Procedure: ESOPHAGOGASTRODUODENOSCOPY (EGD) WITH PROPOFOL;  Surgeon: Regis Bill, MD;  Location: [facility] ENDOSCOPY;  Service: Endoscopy;  Laterality: N/A;   ESOPHAGOGASTRODUODENOSCOPY (EGD) WITH PROPOFOL N/A 02/18/2020   Procedure: ESOPHAGOGASTRODUODENOSCOPY (EGD) WITH PROPOFOL;  Surgeon: Regis Bill, MD;  Location: [facility] ENDOSCOPY;  Service: Endoscopy;  Laterality: N/A;   ESOPHAGOGASTRODUODENOSCOPY (EGD) WITH PROPOFOL N/A 08/02/2021   Procedure: ESOPHAGOGASTRODUODENOSCOPY (EGD) WITH PROPOFOL;  Surgeon: Regis Bill, MD;  Location: [facility] ENDOSCOPY;  Service: Endoscopy;  Laterality: N/A;  REQUEST AM   ESOPHAGOGASTRODUODENOSCOPY (EGD) WITH PROPOFOL N/A 10/05/2021   Procedure: ESOPHAGOGASTRODUODENOSCOPY (EGD) WITH PROPOFOL;  Surgeon: Regis Bill, MD;  Location: [facility] ENDOSCOPY;  Service: Endoscopy;  Laterality: N/A;   FRACTURE SURGERY Right 05/28/2019   distal  radius fracture   HERNIA REPAIR     left inguinal hernia repair as an infant   KYPHOPLASTY N/A 02/11/2016   Procedure: KYPHOPLASTY  L2,T9;  Surgeon: Kennedy Bucker, MD;  Location: [facility] ORS;  Service: Orthopedics;  Laterality: N/A;   KYPHOPLASTY N/A  03/15/2016   Procedure: KYPHOPLASTY L3;  Surgeon: Kennedy Bucker, MD;  Location: [facility] ORS;  Service: Orthopedics;  Laterality: N/A;   OPEN REDUCTION INTERNAL FIXATION (ORIF) DISTAL RADIAL FRACTURE Right 05/28/2019   Procedure: OPEN REDUCTION INTERNAL FIXATION (ORIF) DISTAL RADIAL FRACTURE;  Surgeon: Kennedy Bucker, MD;  Location: [facility] ORS;  Service: Orthopedics;  Laterality: Right;   skin cancer removed     TONSILLECTOMY     Patient Active Problem List   Diagnosis Date Noted   Acute respiratory failure due to COVID-19 (HCC) 05/28/2019   Alcohol dependence with uncomplicated withdrawal (HCC) 01/23/2018   Alcohol withdrawal (HCC) 01/23/2018   GIB (gastrointestinal bleeding) 12/09/2017   A-fib (HCC) 12/09/2017   Esophageal obstruction 10/10/2016   Esophageal ulceration 10/10/2016   GI bleed 09/20/2016   Low vitamin B12 level 03/18/2016   S/P kyphoplasty 03/18/2016   Basal cell carcinoma 02/08/2016   Atrial fibrillation (HCC) 01/27/2016   Colitis 01/27/2016   HLD (hyperlipidemia) 01/27/2016   Anxiety 01/27/2016   Hypokalemia 01/27/2016   Major depressive disorder, recurrent episode, moderate (HCC) 04/14/2015   Anxiety, generalized 12/06/2013   Parkinson disease 08/07/2013   Dysphagia 05/12/2013   Esophageal mass 05/12/2013   GERD (gastroesophageal reflux disease) 05/12/2013   Vomiting 05/12/2013   Open bite of lower leg 05/10/2013   Dog bite of lower leg 05/10/2013   Abnormal finding on liver function 03/25/2013   Benign neoplasm 03/25/2013   Dizziness 03/25/2013   Type 2 diabetes mellitus (HCC) 03/25/2013   BP (high blood pressure) 03/25/2013   Awareness of heartbeats 03/25/2013   Pure hypercholesterolemia 03/25/2013   Infective tonsillitis 03/25/2013   Adenomatous polyp 03/25/2013   ONSET DATE: 10 years   REFERRING DIAG: Parkinson's Disease  THERAPY DIAG:  Muscle weakness (generalized)  Other lack of coordination  Rationale for Evaluation and Treatment:  Rehabilitation  SUBJECTIVE:  SUBJECTIVE STATEMENT: Pt reports having had an appointment with the Neurologist Pt accompanied by: self; spouse present in lobby, but did not follow for eval  PERTINENT HISTORY: Pt has been working with outpatient PT here at [facility] since end of Feb 2024.  OT ordered d/t worsening PD symptoms contributing to functional decline with ADLs.   PRECAUTIONS: Fall  WEIGHT BEARING RESTRICTIONS: No  PAIN:  Are you having pain? No pain reported today.  FALLS: Has patient fallen in last 6 months? Yes. Number of falls 1  LIVING ENVIRONMENT: Lives with: lives with their spouse Lives in: split level but pt remains on main level to avoid steps Stairs: Yes: Internal: 2 steps; can reach both Has following equipment at home: Quad cane small base, Walker - 4 wheeled, bed side commode, Grab bars, and pull down seat for shower, hand held shower hose,   PLOF: Needs assistance with ADLs; pt reports that spouse assists but is consistent to always let pt try things first before she automatically helps.    PATIENT GOALS: To be more indep  OBJECTIVE:  HAND DOMINANCE: Right (most affected side from PD)  ADLs: Overall ADLs: spouse is primary caregiver Transfers/ambulation related to ADLs: uses RW Eating: spouse cuts food; pt reports having to use L non-dominant hand to eat for the last 5-6 yrs Grooming: set up; assist to squeeze toothpaste  and denture paste; uses L non-dominant hand UB Dressing: set up; assist with clothing fasteners LB Dressing: Min A; assist with socks, dons slip on shoes with set up (has tried a sockaid but was unsuccessful) Toileting: modified indep Bathing: Set up/min A; spouse squeezes shampoo into pt's hands, spouse helps to towel dry  Tub Shower transfers: distant supv Equipment:  see above   IADLs: Shopping: pt can accompany spouse by pushing a shopping cart.  Light housekeeping: pt can drag trash to bin and can roll the bin to the road and  back Meal Prep: spouse manages  Community mobility: spouse drives; uses rollator for longer distances  Medication management: spouse manages pills using weekly pill organizer.   Financial management: spouse manages  Handwriting: Mild micrographia and <25% legible  MOBILITY STATUS: Hx of falls  POSTURE COMMENTS:  rounded shoulders and forward head, R shoulder elevated  ACTIVITY TOLERANCE: Activity tolerance: TBD and assessed further with functional activities   FUNCTIONAL OUTCOME MEASURES: FOTO: TBD   UPPER EXTREMITY ROM:    Active ROM Right eval Left eval Right 07/12/2022 Left 07/12/2022 Right 08/28/2022 Left  08/28/2022  Shoulder flexion 118 85 120 117 124 120  Shoulder abduction 128 124 132 127 132 130  Shoulder adduction        Shoulder extension        Shoulder internal rotation [facility] WFL  Shoulder external rotation [facility] WFL  Elbow flexion        Elbow extension        Wrist flexion 55 76 59 75 59 75  Wrist extension 57 75 61 71 61 71  Wrist ulnar deviation        Wrist radial deviation        Wrist pronation [facility] WFL  Wrist supination 63 75 66 81 55 81  (Blank rows = not tested)  UPPER EXTREMITY MMT:     MMT Right eval Left eval Right 07/12/2022 Left 07/12/2022 Right 08/23/2022 Left 08/23/2022  Shoulder flexion 4 4 4+ 4+ 4+ 4+  Shoulder abduction 4+ 4+ 4+ 4+ 4+ 4+  Shoulder adduction        Shoulder extension        Shoulder internal rotation        Shoulder external rotation        Middle trapezius        Lower trapezius        Elbow flexion 4+ 4+ 4+ 4+ 5 5  Elbow extension 4+ 4+ 4+ 4+ 4+ 4+  Wrist flexion 4+ 4+ 4+ 4+ 4+ 4+  Wrist extension 4+ 4+ 4+ 4+ 4+ 4+  Wrist ulnar deviation        Wrist radial deviation        Wrist pronation        Wrist supination        (Blank rows = not tested)  HAND FUNCTION: Grip strength: Right: 16 lbs; Left: 38 lbs, Lateral pinch: Right: 10 lbs, Left: 14 lbs, and 3 point pinch:  Right: 9 lbs, Left: 11 lbs 07/12/2022: Grip strength: Right: 16 lbs; Left: 40 lbs, Lateral pinch: Right: 10 lbs, Left: 14 lbs, and 3 point pinch: Right: 9 lbs, Left: 15 lbs  08/23/2022: Grip strength: Right: 7 lbs; Left: 30 lbs, Lateral pinch: Right: 10 lbs, Left: 14 lbs, and 3 point pinch: Right: 6 lbs, Left: 14 lbs COORDINATION: 9 Hole Peg test: Right: 38 sec; Left: 40  sec 07/12/2022: 9 Hole Peg test: Right: 35 sec; Left: 36 sec 08/23/2022: 9 Hole Peg test: Right: 51 sec; Left: 39 sec SENSATION: WFL  EDEMA: None  MUSCLE TONE: R/L normal  COGNITION: Overall cognitive status: Within functional limits for tasks assessed  VISION: Subjective report: wears glasses all the time    PRAXIS: Impaired: Motor planning  OBSERVATIONS:  R SF PIP flexion contracture 105* from 5-6 years ago from a fall  TODAY'S TREATMENT:                                                                                                                              DATE: 08/25/2022:  There. Ex.:  Pt. worked on [employer], and reciprocal motion using the UBE while seated for 8 min. with no resistance. Constant monitoring was provided. Pt. Worked on bilateral digit flexion using the DigiFlex 1.5#.  Neuromuscular re-education:  Pt. worked on grasping, flipping, turning, and stacking [place] discs. Pt. required visual demonstration, and cues for movement patterns. Pt. worked on speed, and hand coordination skills flipping the discs unilaterally with each the right, and left hand, followed by simultaneously.      PATIENT EDUCATION: Education details:  UE strengthening Person educated: Patient Education method: Explanation and Verbal cues, demo Education comprehension: verbalized understanding, demonstrated understanding  HOME EXERCISE PROGRAM: Theraputty   GOALS: Goals reviewed with patient? Yes  SHORT TERM GOALS: Target date: 10/04/2022   (6 weeks)  Pt will be indep to perform HEP for improving bilat  hand strength and coordination skills. Baseline: Eval: not yet initiated 07/12/2022: Completing theraputty exercises independent at home Goal status: Met/Achieved  LONG TERM GOALS: Target date: 11/15/2022 (12 weeks)  Pt will increase FOTO score to (TBD) or better to indicate improvement in self perceived functional use of the R arm with daily tasks. Baseline: Eval: TBD 07/12/2022: TBD Goal status: Discontinued  2.  Pt will increase R grip strength by 5 or more lbs to more easily hold and stabilize ADL supplies in the R dominant hand. Baseline: 08/25/2022: Grip strength: Right: 7 lbs; Left: 30 lbs 08/23/2022:  Grip strength: Right: 7 lbs; Left: 30 lbs R grip 16 lbs; limited from PD but also by 5th digit PIP flexion contracture, as this digit can not engage in gripping (L 38 lbs) 07/12/2022: Grip strength: Right: 16 lbs; limited from PD but also by 5th digit PIP flexion contracture, as this digit still can not engage in gripping (L 40 lbs) Goal status: Ongoing  3.  Pt will improve R hand FMC/dexterity skills to be able to sign his name on medical/legal documents with 75% legibility, using adapted writing aids as needed. Baseline: 08/25/2022: Improving writing, however this is dependent on whether or not he is have a bad day with tremors 08/23/2022: Improving writing, however this is dependent on whether or not he is have a bad day with tremors. Eval: <25% legible and pt verbalizes embarrassment when attempting to sign his name  on forms 07/12/2022: 75% legibility when writing his name in print, however reports he is still not satisfied with his signature looks. Goal status: Ongoing  4.  Pt will increase R FMC/GMC skills to engage the RUE into UB ADLs at least 50% of the time. Baseline:  08/25/2022: Pt. Is trying to engage his right UE more during daily ADL, and IADL tasks. Eval: Pt uses the L non-dominant arm for self care tasks. 08/23/2022: Pt. Is trying to engage his right UE more during daily ADL, and IADL  tasks. Eval: Pt uses the L non-dominant arm for self care tasks. 07/12/22: Pt. Reports that he has been trying to incorporate his R hand while still using the L arm to complete all self care tasks.  Goal status: Ongoing  5. Pt. Will improve typing skills  by 1 min.  In preparation for typing an email correspondence  Baseline:  08/25/2022: Pt. Typed I sentence in 1 min. & 27 sec. With the left hand. Pt. Typed 1 sentence in 1 min. & 40 sec. With bilateral hands. Limited typing engagement with the right hand.  08/23/2022: Pt. Typed I sentence in 1 min. & 27 sec. With the left hand. Pt. Typed 1 sentence in 1 min. & 40 sec. With bilateral hands. Limited typing engagement with the right hand.    Goal status: New   ASSESSMENT:  CLINICAL IMPRESSION:  Measurements were obtained, and goals were modified at the recertification progress report update during the last session. Pt. to continue with the POC, and updated goals.  Pt. has made progress with bilateral shoulder flexion, and abduction. Bilateral elbow flexion has improved. Pt. continues to present with limited BUE strength, limited grip strength, limited pinch strength, and impaired [facility] skills. A goal was added to the POC to improve typing skills in preparation for typing an email correspondence. Pt. Continues to present with increased tremors which limit motor control, and coordination during ADLs/IADLs pt. has been [place] vacation at the beach with his family for the past couple of weeks. Pt. continues to benefit from skilled OT for increasing bilateral hand strength, improve coordination, specifically working to increase engagement of the RUE during self care tasks.    PERFORMANCE DEFICITS: in functional skills including ADLs, IADLs, coordination, dexterity, ROM, strength, flexibility, Fine motor control, Gross motor control, mobility, balance, body mechanics, endurance, decreased knowledge of use of DME, and UE functional use, cognitive skills including  emotional and memory, and psychosocial skills including coping strategies, environmental adaptation, habits, and routines and behaviors.   IMPAIRMENTS: are limiting patient from ADLs, IADLs, and leisure.   CO-MORBIDITIES: has co-morbidities such as anxiety, depression  that affects occupational performance. Patient will benefit from skilled OT to address above impairments and improve overall function.  MODIFICATION OR ASSISTANCE TO COMPLETE EVALUATION: No modification of tasks or assist necessary to complete an evaluation.  OT OCCUPATIONAL PROFILE AND HISTORY: Problem focused assessment: Including review of records relating to presenting problem.  CLINICAL DECISION MAKING: Moderate - several treatment options, min-mod task modification necessary  REHAB POTENTIAL: Good for goals  EVALUATION COMPLEXITY: Moderate    PLAN:  OT FREQUENCY: 2x/week  OT DURATION: 12 weeks  PLANNED INTERVENTIONS: self care/ADL training, therapeutic exercise, therapeutic activity, neuromuscular re-education, manual therapy, passive range of motion, balance training, functional mobility training, moist heat, cryotherapy, patient/family education, cognitive remediation/compensation, psychosocial skills training, energy conservation, coping strategies training, and DME and/or AE instructions RECOMMENDED OTHER SERVICES: None at this time  CONSULTED AND AGREED WITH PLAN OF CARE:  Patient  PLAN FOR NEXT SESSION: handwriting  Olegario Messier, M.S. OTR/L  08/25/22, 2:32 PM

## 2022-08-30 ENCOUNTER — Encounter: Payer: Self-pay | Admitting: Physical Therapy

## 2022-08-30 ENCOUNTER — Ambulatory Visit: Payer: Medicare Other | Admitting: Occupational Therapy

## 2022-08-30 ENCOUNTER — Ambulatory Visit: Payer: Medicare Other | Admitting: Physical Therapy

## 2022-08-30 DIAGNOSIS — R278 Other lack of coordination: Secondary | ICD-10-CM

## 2022-08-30 DIAGNOSIS — M6281 Muscle weakness (generalized): Secondary | ICD-10-CM

## 2022-08-30 DIAGNOSIS — R2681 Unsteadiness on feet: Secondary | ICD-10-CM

## 2022-08-30 DIAGNOSIS — R262 Difficulty in walking, not elsewhere classified: Secondary | ICD-10-CM

## 2022-08-30 DIAGNOSIS — R269 Unspecified abnormalities of gait and mobility: Secondary | ICD-10-CM

## 2022-08-30 DIAGNOSIS — R2689 Other abnormalities of gait and mobility: Secondary | ICD-10-CM

## 2022-08-30 NOTE — Therapy (Signed)
Occupational Therapy Neuro Treatment Note   Patient Name: Charles Stanton MRN: [id number] DOB:07/13/1955, 67 y.o., male Today]'s Date: 08/30/2022  PCP: Dr. Barbette Reichmann REFERRING PROVIDER: Dr. Cristopher Peru  END OF SESSION:  OT End of Session - 08/30/22 1749     Visit Number 21    Number of Visits 24    Date for OT Re-Evaluation 11/15/22    OT Start Time 1400    OT Stop Time 1445    OT Time Calculation (min) 45 min    Activity Tolerance Patient tolerated treatment well    Behavior During Therapy [facility] for tasks assessed/performed             Past Medical History:  Diagnosis Date   Abnormal liver function    Anemia    Anxiety    Atrial fibrillation (HCC)    Cancer (HCC) 2017   skin   Colonic mass    Depression    Dizziness    Dry cough    History of colon polyps    Hypercholesteremia    Hyperlipemia    Hypertension    Palpitations    Parkinson's disease    Parkinson's disease    PVD (peripheral vascular disease) (HCC)    Tonsillitis 03/25/2013   Tremors of nervous system    Past Surgical History:  Procedure Laterality Date   BACK SURGERY     Kyphoplasty   March 2018   COLONOSCOPY     COLONOSCOPY WITH PROPOFOL N/A 12/12/2017   Procedure: COLONOSCOPY WITH PROPOFOL;  Surgeon: Wyline Mood, MD;  Location: [facility] ENDOSCOPY;  Service: Gastroenterology;  Laterality: N/A;   ESOPHAGOGASTRODUODENOSCOPY N/A 12/11/2017   Procedure: ESOPHAGOGASTRODUODENOSCOPY (EGD);  Surgeon: Wyline Mood, MD;  Location: [facility] ENDOSCOPY;  Service: Gastroenterology;  Laterality: N/A;   ESOPHAGOGASTRODUODENOSCOPY N/A 10/08/2021   Procedure: ESOPHAGOGASTRODUODENOSCOPY (EGD);  Surgeon: Regis Bill, MD;  Location: [facility] ENDOSCOPY;  Service: Endoscopy;  Laterality: N/A;   ESOPHAGOGASTRODUODENOSCOPY (EGD) WITH PROPOFOL N/A 09/21/2016   Procedure: ESOPHAGOGASTRODUODENOSCOPY (EGD) WITH PROPOFOL;  Surgeon: Wyline Mood, MD;  Location: [facility] ENDOSCOPY;  Service: Gastroenterology;  Laterality: N/A;    ESOPHAGOGASTRODUODENOSCOPY (EGD) WITH PROPOFOL N/A 11/07/2016   Procedure: ESOPHAGOGASTRODUODENOSCOPY (EGD) WITH PROPOFOL;  Surgeon: Wyline Mood, MD;  Location: [facility] ENDOSCOPY;  Service: Gastroenterology;  Laterality: N/A;   ESOPHAGOGASTRODUODENOSCOPY (EGD) WITH PROPOFOL N/A 10/15/2019   Procedure: ESOPHAGOGASTRODUODENOSCOPY (EGD) WITH PROPOFOL;  Surgeon: Regis Bill, MD;  Location: [facility] ENDOSCOPY;  Service: Endoscopy;  Laterality: N/A;   ESOPHAGOGASTRODUODENOSCOPY (EGD) WITH PROPOFOL N/A 10/28/2019   Procedure: ESOPHAGOGASTRODUODENOSCOPY (EGD) WITH PROPOFOL;  Surgeon: Regis Bill, MD;  Location: [facility] ENDOSCOPY;  Service: Endoscopy;  Laterality: N/A;   ESOPHAGOGASTRODUODENOSCOPY (EGD) WITH PROPOFOL N/A 12/02/2019   Procedure: ESOPHAGOGASTRODUODENOSCOPY (EGD) WITH PROPOFOL;  Surgeon: Regis Bill, MD;  Location: [facility] ENDOSCOPY;  Service: Endoscopy;  Laterality: N/A;   ESOPHAGOGASTRODUODENOSCOPY (EGD) WITH PROPOFOL N/A 02/18/2020   Procedure: ESOPHAGOGASTRODUODENOSCOPY (EGD) WITH PROPOFOL;  Surgeon: Regis Bill, MD;  Location: [facility] ENDOSCOPY;  Service: Endoscopy;  Laterality: N/A;   ESOPHAGOGASTRODUODENOSCOPY (EGD) WITH PROPOFOL N/A 08/02/2021   Procedure: ESOPHAGOGASTRODUODENOSCOPY (EGD) WITH PROPOFOL;  Surgeon: Regis Bill, MD;  Location: [facility] ENDOSCOPY;  Service: Endoscopy;  Laterality: N/A;  REQUEST AM   ESOPHAGOGASTRODUODENOSCOPY (EGD) WITH PROPOFOL N/A 10/05/2021   Procedure: ESOPHAGOGASTRODUODENOSCOPY (EGD) WITH PROPOFOL;  Surgeon: Regis Bill, MD;  Location: [facility] ENDOSCOPY;  Service: Endoscopy;  Laterality: N/A;   FRACTURE SURGERY Right 05/28/2019   distal radius fracture   HERNIA REPAIR     left inguinal  hernia repair as an infant   KYPHOPLASTY N/A 02/11/2016   Procedure: KYPHOPLASTY  L2,T9;  Surgeon: Kennedy Bucker, MD;  Location: [facility] ORS;  Service: Orthopedics;  Laterality: N/A;   KYPHOPLASTY N/A 03/15/2016   Procedure: KYPHOPLASTY L3;  Surgeon:  Kennedy Bucker, MD;  Location: [facility] ORS;  Service: Orthopedics;  Laterality: N/A;   OPEN REDUCTION INTERNAL FIXATION (ORIF) DISTAL RADIAL FRACTURE Right 05/28/2019   Procedure: OPEN REDUCTION INTERNAL FIXATION (ORIF) DISTAL RADIAL FRACTURE;  Surgeon: Kennedy Bucker, MD;  Location: [facility] ORS;  Service: Orthopedics;  Laterality: Right;   skin cancer removed     TONSILLECTOMY     Patient Active Problem List   Diagnosis Date Noted   Acute respiratory failure due to COVID-19 (HCC) 05/28/2019   Alcohol dependence with uncomplicated withdrawal (HCC) 01/23/2018   Alcohol withdrawal (HCC) 01/23/2018   GIB (gastrointestinal bleeding) 12/09/2017   A-fib (HCC) 12/09/2017   Esophageal obstruction 10/10/2016   Esophageal ulceration 10/10/2016   GI bleed 09/20/2016   Low vitamin B12 level 03/18/2016   S/P kyphoplasty 03/18/2016   Basal cell carcinoma 02/08/2016   Atrial fibrillation (HCC) 01/27/2016   Colitis 01/27/2016   HLD (hyperlipidemia) 01/27/2016   Anxiety 01/27/2016   Hypokalemia 01/27/2016   Major depressive disorder, recurrent episode, moderate (HCC) 04/14/2015   Anxiety, generalized 12/06/2013   Parkinson disease 08/07/2013   Dysphagia 05/12/2013   Esophageal mass 05/12/2013   GERD (gastroesophageal reflux disease) 05/12/2013   Vomiting 05/12/2013   Open bite of lower leg 05/10/2013   Dog bite of lower leg 05/10/2013   Abnormal finding on liver function 03/25/2013   Benign neoplasm 03/25/2013   Dizziness 03/25/2013   Type 2 diabetes mellitus (HCC) 03/25/2013   BP (high blood pressure) 03/25/2013   Awareness of heartbeats 03/25/2013   Pure hypercholesterolemia 03/25/2013   Infective tonsillitis 03/25/2013   Adenomatous polyp 03/25/2013   ONSET DATE: 10 years   REFERRING DIAG: Parkinson's Disease  THERAPY DIAG:  Muscle weakness (generalized)  Other lack of coordination  Rationale for Evaluation and Treatment: Rehabilitation  SUBJECTIVE:  SUBJECTIVE STATEMENT: Pt reports  having had an appointment with the Neurologist Pt accompanied by: self; spouse present in lobby, but did not follow for eval  PERTINENT HISTORY: Pt has been working with outpatient PT here at [facility] since end of Feb 2024.  OT ordered d/t worsening PD symptoms contributing to functional decline with ADLs.   PRECAUTIONS: Fall  WEIGHT BEARING RESTRICTIONS: No  PAIN:  Are you having pain? No pain reported today.  FALLS: Has patient fallen in last 6 months? Yes. Number of falls 1  LIVING ENVIRONMENT: Lives with: lives with their spouse Lives in: split level but pt remains on main level to avoid steps Stairs: Yes: Internal: 2 steps; can reach both Has following equipment at home: Quad cane small base, Walker - 4 wheeled, bed side commode, Grab bars, and pull down seat for shower, hand held shower hose,   PLOF: Needs assistance with ADLs; pt reports that spouse assists but is consistent to always let pt try things first before she automatically helps.    PATIENT GOALS: To be more indep  OBJECTIVE:  HAND DOMINANCE: Right (most affected side from PD)  ADLs: Overall ADLs: spouse is primary caregiver Transfers/ambulation related to ADLs: uses RW Eating: spouse cuts food; pt reports having to use L non-dominant hand to eat for the last 5-6 yrs Grooming: set up; assist to squeeze toothpaste and denture paste; uses L non-dominant hand UB Dressing: set up; assist  with clothing fasteners LB Dressing: Min A; assist with socks, dons slip on shoes with set up (has tried a sockaid but was unsuccessful) Toileting: modified indep Bathing: Set up/min A; spouse squeezes shampoo into pt's hands, spouse helps to towel dry  Tub Shower transfers: distant supv Equipment:  see above   IADLs: Shopping: pt can accompany spouse by pushing a shopping cart.  Light housekeeping: pt can drag trash to bin and can roll the bin to the road and back Meal Prep: spouse manages  Community mobility: spouse drives; uses  rollator for longer distances  Medication management: spouse manages pills using weekly pill organizer.   Financial management: spouse manages  Handwriting: Mild micrographia and <25% legible  MOBILITY STATUS: Hx of falls  POSTURE COMMENTS:  rounded shoulders and forward head, R shoulder elevated  ACTIVITY TOLERANCE: Activity tolerance: TBD and assessed further with functional activities   FUNCTIONAL OUTCOME MEASURES: FOTO: TBD   UPPER EXTREMITY ROM:    Active ROM Right eval Left eval Right 07/12/2022 Left 07/12/2022 Right 08/28/2022 Left  08/28/2022  Shoulder flexion 118 85 120 117 124 120  Shoulder abduction 128 124 132 127 132 130  Shoulder adduction        Shoulder extension        Shoulder internal rotation [facility] WFL  Shoulder external rotation [facility] WFL  Elbow flexion        Elbow extension        Wrist flexion 55 76 59 75 59 75  Wrist extension 57 75 61 71 61 71  Wrist ulnar deviation        Wrist radial deviation        Wrist pronation [facility] WFL  Wrist supination 63 75 66 81 55 81  (Blank rows = not tested)  UPPER EXTREMITY MMT:     MMT Right eval Left eval Right 07/12/2022 Left 07/12/2022 Right 08/23/2022 Left 08/23/2022  Shoulder flexion 4 4 4+ 4+ 4+ 4+  Shoulder abduction 4+ 4+ 4+ 4+ 4+ 4+  Shoulder adduction        Shoulder extension        Shoulder internal rotation        Shoulder external rotation        Middle trapezius        Lower trapezius        Elbow flexion 4+ 4+ 4+ 4+ 5 5  Elbow extension 4+ 4+ 4+ 4+ 4+ 4+  Wrist flexion 4+ 4+ 4+ 4+ 4+ 4+  Wrist extension 4+ 4+ 4+ 4+ 4+ 4+  Wrist ulnar deviation        Wrist radial deviation        Wrist pronation        Wrist supination        (Blank rows = not tested)  HAND FUNCTION: Grip strength: Right: 16 lbs; Left: 38 lbs, Lateral pinch: Right: 10 lbs, Left: 14 lbs, and 3 point pinch: Right: 9 lbs, Left: 11 lbs 07/12/2022: Grip strength: Right: 16 lbs; Left:  40 lbs, Lateral pinch: Right: 10 lbs, Left: 14 lbs, and 3 point pinch: Right: 9 lbs, Left: 15 lbs  08/23/2022: Grip strength: Right: 7 lbs; Left: 30 lbs, Lateral pinch: Right: 10 lbs, Left: 14 lbs, and 3 point pinch: Right: 6 lbs, Left: 14 lbs COORDINATION: 9 Hole Peg test: Right: 38 sec; Left: 40 sec 07/12/2022: 9 Hole Peg test: Right: 35 sec; Left: 36 sec  08/23/2022: 9 Hole Peg test: Right: 51 sec; Left: 39 sec SENSATION: WFL  EDEMA: None  MUSCLE TONE: R/L normal  COGNITION: Overall cognitive status: Within functional limits for tasks assessed  VISION: Subjective report: wears glasses all the time    PRAXIS: Impaired: Motor planning  OBSERVATIONS:  R SF PIP flexion contracture 105* from 5-6 years ago from a fall  TODAY'S TREATMENT:                                                                                                                              DATE: 08/30/2022:  There. Ex.:  Pt. worked on [employer], and reciprocal motion using the UBE while seated for 8 min. with no resistance. Constant monitoring was provided.  Pt. worked on gross gripping using a gripper with 1 red resistive band. Pt. Worked on pinch strengthening in the bilateral hand strength for lateral, and 3pt. pinch using yellow, red, green, and blue resistive clips. Pt. worked on placing the clips at various vertical and horizontal angles. Tactile and verbal cues were required for eliciting the desired movement.   Neuromuscular re-education:  Pt. worked on [employer] skills manipulating nuts, and bolts on a bolt board. Pt. worked on screwing, and unscrewing nuts, and bolts of varying sizes, and challenging progressively smaller items with vision occluded for emphasis placed on a sensory component.             PATIENT EDUCATION: Education details:  UE strengthening Person educated: Patient Education method: Explanation and Verbal cues, demo Education comprehension: verbalized understanding, demonstrated  understanding  HOME EXERCISE PROGRAM: Theraputty   GOALS: Goals reviewed with patient? Yes  SHORT TERM GOALS: Target date: 10/04/2022   (6 weeks)  Pt will be indep to perform HEP for improving bilat hand strength and coordination skills. Baseline: Eval: not yet initiated 07/12/2022: Completing theraputty exercises independent at home Goal status: Met/Achieved  LONG TERM GOALS: Target date: 11/15/2022 (12 weeks)  Pt will increase FOTO score to (TBD) or better to indicate improvement in self perceived functional use of the R arm with daily tasks. Baseline: Eval: TBD 07/12/2022: TBD Goal status: Discontinued  2.  Pt will increase R grip strength by 5 or more lbs to more easily hold and stabilize ADL supplies in the R dominant hand. Baseline: 08/25/2022: Grip strength: Right: 7 lbs; Left: 30 lbs 08/23/2022:  Grip strength: Right: 7 lbs; Left: 30 lbs R grip 16 lbs; limited from PD but also by 5th digit PIP flexion contracture, as this digit can not engage in gripping (L 38 lbs) 07/12/2022: Grip strength: Right: 16 lbs; limited from PD but also by 5th digit PIP flexion contracture, as this digit still can not engage in gripping (L 40 lbs) Goal status: Ongoing  3.  Pt will improve R hand FMC/dexterity skills to be able to sign his name on medical/legal documents with 75% legibility, using adapted writing aids as needed. Baseline: 08/25/2022: Improving writing, however this is  dependent on whether or not he is have a bad day with tremors 08/23/2022: Improving writing, however this is dependent on whether or not he is have a bad day with tremors. Eval: <25% legible and pt verbalizes embarrassment when attempting to sign his name on forms 07/12/2022: 75% legibility when writing his name in print, however reports he is still not satisfied with his signature looks. Goal status: Ongoing  4.  Pt will increase R FMC/GMC skills to engage the RUE into UB ADLs at least 50% of the time. Baseline:  08/25/2022: Pt.  Is trying to engage his right UE more during daily ADL, and IADL tasks. Eval: Pt uses the L non-dominant arm for self care tasks. 08/23/2022: Pt. Is trying to engage his right UE more during daily ADL, and IADL tasks. Eval: Pt uses the L non-dominant arm for self care tasks. 07/12/22: Pt. Reports that he has been trying to incorporate his R hand while still using the L arm to complete all self care tasks.  Goal status: Ongoing  5. Pt. Will improve typing skills  by 1 min.  In preparation for typing an email correspondence  Baseline:  08/25/2022: Pt. Typed I sentence in 1 min. & 27 sec. With the left hand. Pt. Typed 1 sentence in 1 min. & 40 sec. With bilateral hands. Limited typing engagement with the right hand.  08/23/2022: Pt. Typed I sentence in 1 min. & 27 sec. With the left hand. Pt. Typed 1 sentence in 1 min. & 40 sec. With bilateral hands. Limited typing engagement with the right hand.    Goal status: New   ASSESSMENT:  CLINICAL IMPRESSION:  Pt. reports liking the UE UBE, and requested to use it again today for strengthening. Pt. tolerated the UE strengthening, grip, and pinch strengthening well today. Pt. requires cues, and assist for hand pinch position on the clips. Pt. requires cues for visual demonstration of proper technique with each exercise.  Pt. continues to present with increased tremors which limit motor control, and coordination during ADLs/IADLs, and requires cues for strategies for proximal stabilization during tasks for more distal control. Pt. continues to benefit from skilled OT for increasing bilateral hand strength, improving coordination, specifically working to increase engagement of the RUE during self care tasks.    PERFORMANCE DEFICITS: in functional skills including ADLs, IADLs, coordination, dexterity, ROM, strength, flexibility, Fine motor control, Gross motor control, mobility, balance, body mechanics, endurance, decreased knowledge of use of DME, and UE functional use,  cognitive skills including emotional and memory, and psychosocial skills including coping strategies, environmental adaptation, habits, and routines and behaviors.   IMPAIRMENTS: are limiting patient from ADLs, IADLs, and leisure.   CO-MORBIDITIES: has co-morbidities such as anxiety, depression  that affects occupational performance. Patient will benefit from skilled OT to address above impairments and improve overall function.  MODIFICATION OR ASSISTANCE TO COMPLETE EVALUATION: No modification of tasks or assist necessary to complete an evaluation.  OT OCCUPATIONAL PROFILE AND HISTORY: Problem focused assessment: Including review of records relating to presenting problem.  CLINICAL DECISION MAKING: Moderate - several treatment options, min-mod task modification necessary  REHAB POTENTIAL: Good for goals  EVALUATION COMPLEXITY: Moderate    PLAN:  OT FREQUENCY: 2x/week  OT DURATION: 12 weeks  PLANNED INTERVENTIONS: self care/ADL training, therapeutic exercise, therapeutic activity, neuromuscular re-education, manual therapy, passive range of motion, balance training, functional mobility training, moist heat, cryotherapy, patient/family education, cognitive remediation/compensation, psychosocial skills training, energy conservation, coping strategies training, and DME and/or AE instructions  RECOMMENDED OTHER SERVICES: None at this time  CONSULTED AND AGREED WITH PLAN OF CARE: Patient  PLAN FOR NEXT SESSION: handwriting  Olegario Messier, M.S. OTR/L  08/30/22, 5:51 PM

## 2022-08-30 NOTE — Therapy (Signed)
Outpatient Physical Therapy Treatment    Patient Details  Name: Charles Stanton MRN: [id number] Date of Birth: Dec 26, 1955 Referring Provider (PT): Dr. Cristopher Peru   Encounter Date: 08/30/2022  END OF SESSION:    PT End of Session - 08/30/22 1322     Visit Number 47    Number of Visits 62    Date for PT Re-Evaluation 10/11/22    Authorization Type Medicare A & B primary; federal employee secondary    Authorization Time Period -10/11/22    Progress Note Due on Visit 50    PT Start Time 1317    PT Stop Time 1359    PT Time Calculation (min) 42 min    Equipment Utilized During Treatment Gait belt    Activity Tolerance Patient tolerated treatment well;No increased pain    Behavior During Therapy [facility] for tasks assessed/performed                          Past Medical History:  Diagnosis Date   Abnormal liver function    Anemia    Anxiety    Atrial fibrillation (HCC)    Cancer (HCC) 2017   skin   Colonic mass    Depression    Dizziness    Dry cough    History of colon polyps    Hypercholesteremia    Hyperlipemia    Hypertension    Palpitations    Parkinson's disease    Parkinson's disease    PVD (peripheral vascular disease) (HCC)    Tonsillitis 03/25/2013   Tremors of nervous system     Past Surgical History:  Procedure Laterality Date   BACK SURGERY     Kyphoplasty   March 2018   COLONOSCOPY     COLONOSCOPY WITH PROPOFOL N/A 12/12/2017   Procedure: COLONOSCOPY WITH PROPOFOL;  Surgeon: Wyline Mood, MD;  Location: [facility] ENDOSCOPY;  Service: Gastroenterology;  Laterality: N/A;   ESOPHAGOGASTRODUODENOSCOPY N/A 12/11/2017   Procedure: ESOPHAGOGASTRODUODENOSCOPY (EGD);  Surgeon: Wyline Mood, MD;  Location: [facility] ENDOSCOPY;  Service: Gastroenterology;  Laterality: N/A;   ESOPHAGOGASTRODUODENOSCOPY N/A 10/08/2021   Procedure: ESOPHAGOGASTRODUODENOSCOPY (EGD);  Surgeon: Regis Bill, MD;  Location: [facility] ENDOSCOPY;  Service: Endoscopy;  Laterality:  N/A;   ESOPHAGOGASTRODUODENOSCOPY (EGD) WITH PROPOFOL N/A 09/21/2016   Procedure: ESOPHAGOGASTRODUODENOSCOPY (EGD) WITH PROPOFOL;  Surgeon: Wyline Mood, MD;  Location: [facility] ENDOSCOPY;  Service: Gastroenterology;  Laterality: N/A;   ESOPHAGOGASTRODUODENOSCOPY (EGD) WITH PROPOFOL N/A 11/07/2016   Procedure: ESOPHAGOGASTRODUODENOSCOPY (EGD) WITH PROPOFOL;  Surgeon: Wyline Mood, MD;  Location: [facility] ENDOSCOPY;  Service: Gastroenterology;  Laterality: N/A;   ESOPHAGOGASTRODUODENOSCOPY (EGD) WITH PROPOFOL N/A 10/15/2019   Procedure: ESOPHAGOGASTRODUODENOSCOPY (EGD) WITH PROPOFOL;  Surgeon: Regis Bill, MD;  Location: [facility] ENDOSCOPY;  Service: Endoscopy;  Laterality: N/A;   ESOPHAGOGASTRODUODENOSCOPY (EGD) WITH PROPOFOL N/A 10/28/2019   Procedure: ESOPHAGOGASTRODUODENOSCOPY (EGD) WITH PROPOFOL;  Surgeon: Regis Bill, MD;  Location: [facility] ENDOSCOPY;  Service: Endoscopy;  Laterality: N/A;   ESOPHAGOGASTRODUODENOSCOPY (EGD) WITH PROPOFOL N/A 12/02/2019   Procedure: ESOPHAGOGASTRODUODENOSCOPY (EGD) WITH PROPOFOL;  Surgeon: Regis Bill, MD;  Location: [facility] ENDOSCOPY;  Service: Endoscopy;  Laterality: N/A;   ESOPHAGOGASTRODUODENOSCOPY (EGD) WITH PROPOFOL N/A 02/18/2020   Procedure: ESOPHAGOGASTRODUODENOSCOPY (EGD) WITH PROPOFOL;  Surgeon: Regis Bill, MD;  Location: [facility] ENDOSCOPY;  Service: Endoscopy;  Laterality: N/A;   ESOPHAGOGASTRODUODENOSCOPY (EGD) WITH PROPOFOL N/A 08/02/2021   Procedure: ESOPHAGOGASTRODUODENOSCOPY (EGD) WITH PROPOFOL;  Surgeon: Regis Bill, MD;  Location: [facility] ENDOSCOPY;  Service: Endoscopy;  Laterality: N/A;  REQUEST AM   ESOPHAGOGASTRODUODENOSCOPY (EGD) WITH PROPOFOL N/A 10/05/2021   Procedure: ESOPHAGOGASTRODUODENOSCOPY (EGD) WITH PROPOFOL;  Surgeon: Regis Bill, MD;  Location: [facility] ENDOSCOPY;  Service: Endoscopy;  Laterality: N/A;   FRACTURE SURGERY Right 05/28/2019   distal radius fracture   HERNIA REPAIR     left inguinal hernia repair as  an infant   KYPHOPLASTY N/A 02/11/2016   Procedure: KYPHOPLASTY  L2,T9;  Surgeon: Kennedy Bucker, MD;  Location: [facility] ORS;  Service: Orthopedics;  Laterality: N/A;   KYPHOPLASTY N/A 03/15/2016   Procedure: KYPHOPLASTY L3;  Surgeon: Kennedy Bucker, MD;  Location: [facility] ORS;  Service: Orthopedics;  Laterality: N/A;   OPEN REDUCTION INTERNAL FIXATION (ORIF) DISTAL RADIAL FRACTURE Right 05/28/2019   Procedure: OPEN REDUCTION INTERNAL FIXATION (ORIF) DISTAL RADIAL FRACTURE;  Surgeon: Kennedy Bucker, MD;  Location: [facility] ORS;  Service: Orthopedics;  Laterality: Right;   skin cancer removed     TONSILLECTOMY       Subjective Assessment -      Subjective  Pt reports doing well today. Reports having success with BM since last session. No other changes since last session. .      Pertinent History Charles Stanton is a 66yoM who was prescribed with Parkinson's Disease ~ 10 years.  Pt is followed by neurology, reports good reponse to medications which help control his tremors somewhat. Pt uses a SPC for AMB, also uses a walker for longer distance walking. Pt goes into community almost daily (they like to eat out) which includes lots of SPC [place] eand navigation of 4 stairs at home.    Currently in Pain? No/denies             TREATMENT:  Unless otherwise stated, CGA was provided and gait belt donned in order to ensure pt safety   TE:   Octane fitness level 3 x 6 min for aerobic priming   NMR  Seated PWR! Moves with 1# wrist weights donned x 10 ea  -UP, ROCK, TWIST , and STEP  - Ambulation x550 feet to medical arts, rest then ambulation x 750 ft ( took longer route) back with SPC and wihtout need for motronome, pt kept good sequencing with cane throughout  Transfer to mat table then to modified all 4s with hands on 24 inch block ( CGA getting into position as pt was not comfortable getting here.  - modified all 4s PWR! Up x 10   Transferred to holding to back of standard armchair  - modified PWR! Step (  ant step), roc, and twist x 10 ea    -PWR! Up works on postural strengthening and antigravity extension, PRW! Rock working on [employer] shifting, PRW! Twist targeting trunk rotation and PRW! Step targeting transition movements. PWR! Moves target bradykinesia, rigidity, and dyskinesia through targeted functional movements that address four core movement difficulties for people with Parkinson's disease.     PT Education -     Education Details Pt educated throughout session about proper posture and technique with exercises. Improved exercise technique, movement at target joints, use of target muscles after min to mod verbal, visual, tactile cues.      Person(s) Educated Patient    Methods Explanation;Demonstration ; handout   Comprehension Verbalized understanding;Returned demonstration;Need further instruction           PT Short Term Goals -       PT SHORT TERM GOAL #1   Title Pt to report regular performance of exercises prescribed for home  and a sense of improvements in strength and balance AEB them not being as challenging anymore.    Baseline Will issue on visit 2    Time 4    Period Weeks    Status met   Target Date 03/15/22      PT SHORT TERM GOAL #2   Title Pt to demonstrate improved power AEB improved 5xSTS hands free in <18sec.    Baseline 03/01/22: 22sec hands free, author provideing foot block bilat 03/31/22: 14.82sec 4/23:17.3 sec   Time 4    Period Weeks    Status Met    Target Date 03/29/22               PT Long Term Goals - Target goal date for all remaining long term goals is 07/19/2022        PT LONG TERM GOAL #1   Title Pt to improve score on FOTO survey to 57 to indicate reduced difficulty with basic mobility required for ADL performance.    Baseline 03/01/22:49 04/29/22:56%. 06/16/22: 60   Time 8    Period Weeks    Status Met          PT LONG TERM GOAL #2   Title Pt to improve tolerance to overground AMB c SPC AEB performance  >843ft.    Baseline 2/27: fatigued after 435ft lap performed in four minutes ten seconds. 03/31/22: 758ft with SPC no rest break 04/26/22: 794 ft 06/16/22: 903 ft with SPC, no rest.   Time 8    Period Weeks    Status Met         PT LONG TERM GOAL #3   Title Pt to demonstrate improved leg power AEB 5xSTS from chair <14sec hands free and without need for minGuard assist for safety and without need for foot block.    Baseline 03/01/22: 22sec hands free, feet blocked, minGuard assist  03/31/22: 14.82sec  4/23:17.3 sec 06/16/22: 13.87 sec 7/16: 11.55 sec   Time 8     Period Weeks    Status MET         PT LONG TERM GOAL #4   Title Pt to demonstrate improvement in balance performance AEB >6 improvement BERG Balance for balance assessment.    Baseline 41 on 03/03/22. 3/38/24: 42 4/23: 45 06/16/22: 48   Time 8    Period Weeks    Status Met          5.  Pt will improve distance with LRAD to 900 feet or greater in order to indicate further progress to community ambulation distances Baseline: 794 ft with SPC , 06/16/22: 903 ft with SPC. 07/19/22: 905 feet Goal status: Met  6.  Patient will improve modified DGI (with use of cane) score > 4 points or greater in order to indicate improved balance and decreased risk of falls.  Baseline: 6/18: 11 7/16: 14  Goal status: IN PROGRESS  7. Patient will be able to withstand various light perturbations while preforming ambulation with LRAD utilizing safe balance strategies in order to simulate safe and confident walking in crowded places. Baseline: Pt currently does not feel safe preforming task, could walk through cafeteria to get baseline. 7/23: 70% confidence with activity, mild LOB with perturbations.  Goal Status: IN PROGRESS  8. Pt will be compliant and independent with Parkinson's based HEP by reciting and demonstrating full PWR! Move sets in seated and or standing with no cueing from therapist. Baseline: Pt currently needs demonstration or verbal  cueing for proper completion  7/23: cues required for proper completion with PWR! Up, rock, and twist in seated position  Goal Status: IN PROGRESS     Plan     Clinical Impression Statement Continued with current plan of care as laid out in evaluation and recent prior sessions. All interventions tolerated as expected by author. Mobility and strength continue to progress as anticipated. Recovery intervals given as needed based on signs of exertion and/or pt request. Pt educated on best technique for each intervention- author uses verbal, visual, tactile cues to optimize learning. Author takes steps to maximize patient independence when appropriate. Pt remains highly motivated. Pt closely monitored throughout session for safe activity response, as well as to maximize patient safety during interventions. Pt will continue to benefit from skilled PT services to address deficits and impairment identified in evaluation in order to maximize independence and safety in basic mobility required for performance of ADL, IADL, and leisure.       Personal Factors and Comorbidities Time since onset of injury/illness/exacerbation;Past/Current Experience    Examination-Activity Limitations Locomotion Level;Transfers;Bed Mobility;Reach Overhead;Hygiene/Grooming;Dressing;Toileting;Stairs;Bend    Stability/Clinical Decision Making Evolving/Moderate complexity    Clinical Decision Making Moderate    Rehab Potential Good    PT Frequency 2x / week    PT Duration 8 weeks    PT Treatment/Interventions ADLs/Self Care Home Management;Moist Heat;Gait training;Stair training;Functional mobility training;Therapeutic activities;Therapeutic exercise;Balance training;Neuromuscular re-education;Patient/family education    PT Next Visit Plan .  Continue POC toward goals.    PT Home Exercise Plan 03/01/22: discussed finding and elevated sit surface for future STS exercises (something ~18-19 inches high)    Consulted and Agree with  Plan of Care Patient             Patient will benefit from skilled therapeutic intervention in order to improve the following deficits and impairments:  balance, strength, coordination, gait, transfers. Posture. ROM   Visit Diagnosis: Muscle weakness (generalized)  Abnormality of gait and mobility  Difficulty in walking, not elsewhere classified  Other abnormalities of gait and mobility  Unsteadiness on feet    Problem List Patient Active Problem List   Diagnosis Date Noted   Acute respiratory failure due to COVID-19 (HCC) 05/28/2019   Alcohol dependence with uncomplicated withdrawal (HCC) 01/23/2018   Alcohol withdrawal (HCC) 01/23/2018   GIB (gastrointestinal bleeding) 12/09/2017   A-fib (HCC) 12/09/2017   Esophageal obstruction 10/10/2016   Esophageal ulceration 10/10/2016   GI bleed 09/20/2016   Low vitamin B12 level 03/18/2016   S/P kyphoplasty 03/18/2016   Basal cell carcinoma 02/08/2016   Atrial fibrillation (HCC) 01/27/2016   Colitis 01/27/2016   HLD (hyperlipidemia) 01/27/2016   Anxiety 01/27/2016   Hypokalemia 01/27/2016   Major depressive disorder, recurrent episode, moderate (HCC) 04/14/2015   Anxiety, generalized 12/06/2013   Parkinson disease 08/07/2013   Dysphagia 05/12/2013   Esophageal mass 05/12/2013   GERD (gastroesophageal reflux disease) 05/12/2013   Vomiting 05/12/2013   Open bite of lower leg 05/10/2013   Dog bite of lower leg 05/10/2013   Abnormal finding on liver function 03/25/2013   Benign neoplasm 03/25/2013   Dizziness 03/25/2013   Type 2 diabetes mellitus (HCC) 03/25/2013   BP (high blood pressure) 03/25/2013   Awareness of heartbeats 03/25/2013   Pure hypercholesterolemia 03/25/2013   Infective tonsillitis 03/25/2013   Adenomatous polyp 03/25/2013      Norman Herrlich PT ,DPT Physical Therapist- [place]  [facility]

## 2022-09-01 ENCOUNTER — Ambulatory Visit: Payer: Medicare Other | Admitting: Occupational Therapy

## 2022-09-01 ENCOUNTER — Ambulatory Visit: Payer: Medicare Other | Admitting: Physical Therapy

## 2022-09-01 ENCOUNTER — Encounter: Payer: Self-pay | Admitting: Physical Therapy

## 2022-09-01 DIAGNOSIS — R2689 Other abnormalities of gait and mobility: Secondary | ICD-10-CM

## 2022-09-01 DIAGNOSIS — M6281 Muscle weakness (generalized): Secondary | ICD-10-CM

## 2022-09-01 DIAGNOSIS — R269 Unspecified abnormalities of gait and mobility: Secondary | ICD-10-CM

## 2022-09-01 DIAGNOSIS — R2681 Unsteadiness on feet: Secondary | ICD-10-CM

## 2022-09-01 DIAGNOSIS — R262 Difficulty in walking, not elsewhere classified: Secondary | ICD-10-CM

## 2022-09-01 NOTE — Therapy (Signed)
Outpatient Physical Therapy Treatment    Patient Details  Name: Charles Stanton MRN: [id number] Date of Birth: [birth year] Referring Provider (PT): Charles Stanton   Encounter Date: 09/01/2022  END OF SESSION:    PT End of Session - 09/01/22 1320     Visit Number 48    Number of Visits 62    Date for PT Re-Evaluation 10/11/22    Authorization Type Medicare A & B primary; federal employee secondary    Authorization Time Period -10/11/22    Progress Note Due on Visit 50    PT Start Time 1317    PT Stop Time 1359    PT Time Calculation (min) 42 min    Equipment Utilized During Treatment Gait belt    Activity Tolerance Patient tolerated treatment well;No increased pain    Behavior During Therapy Charles Stanton for tasks assessed/performed                           Past Medical History:  Diagnosis Date   Abnormal liver function    Anemia    Anxiety    Atrial fibrillation (HCC)    Cancer (HCC) 2017   skin   Colonic mass    Depression    Dizziness    Dry cough    History of colon polyps    Hypercholesteremia    Hyperlipemia    Hypertension    Palpitations    Parkinson's disease    Parkinson's disease    PVD (peripheral vascular disease) (HCC)    Tonsillitis 03/25/2013   Tremors of nervous system     Past Surgical History:  Procedure Laterality Date   BACK SURGERY     Kyphoplasty   March 2018   COLONOSCOPY     COLONOSCOPY WITH PROPOFOL N/A 12/12/2017   Procedure: COLONOSCOPY WITH PROPOFOL;  Surgeon: Charles Mood, MD;  Location: [facility] ENDOSCOPY;  Service: Gastroenterology;  Laterality: N/A;   ESOPHAGOGASTRODUODENOSCOPY N/A 12/11/2017   Procedure: ESOPHAGOGASTRODUODENOSCOPY (EGD);  Surgeon: Charles Mood, MD;  Location: [facility] ENDOSCOPY;  Service: Gastroenterology;  Laterality: N/A;   ESOPHAGOGASTRODUODENOSCOPY N/A 10/08/2021   Procedure: ESOPHAGOGASTRODUODENOSCOPY (EGD);  Surgeon: Charles Bill, MD;  Location: [facility] ENDOSCOPY;  Service: Endoscopy;  Laterality:  N/A;   ESOPHAGOGASTRODUODENOSCOPY (EGD) WITH PROPOFOL N/A 09/21/2016   Procedure: ESOPHAGOGASTRODUODENOSCOPY (EGD) WITH PROPOFOL;  Surgeon: Charles Mood, MD;  Location: [facility] ENDOSCOPY;  Service: Gastroenterology;  Laterality: N/A;   ESOPHAGOGASTRODUODENOSCOPY (EGD) WITH PROPOFOL N/A 11/07/2016   Procedure: ESOPHAGOGASTRODUODENOSCOPY (EGD) WITH PROPOFOL;  Surgeon: Charles Mood, MD;  Location: [facility] ENDOSCOPY;  Service: Gastroenterology;  Laterality: N/A;   ESOPHAGOGASTRODUODENOSCOPY (EGD) WITH PROPOFOL N/A 10/15/2019   Procedure: ESOPHAGOGASTRODUODENOSCOPY (EGD) WITH PROPOFOL;  Surgeon: Charles Bill, MD;  Location: [facility] ENDOSCOPY;  Service: Endoscopy;  Laterality: N/A;   ESOPHAGOGASTRODUODENOSCOPY (EGD) WITH PROPOFOL N/A 10/28/2019   Procedure: ESOPHAGOGASTRODUODENOSCOPY (EGD) WITH PROPOFOL;  Surgeon: Charles Bill, MD;  Location: [facility] ENDOSCOPY;  Service: Endoscopy;  Laterality: N/A;   ESOPHAGOGASTRODUODENOSCOPY (EGD) WITH PROPOFOL N/A 12/02/2019   Procedure: ESOPHAGOGASTRODUODENOSCOPY (EGD) WITH PROPOFOL;  Surgeon: Charles Bill, MD;  Location: [facility] ENDOSCOPY;  Service: Endoscopy;  Laterality: N/A;   ESOPHAGOGASTRODUODENOSCOPY (EGD) WITH PROPOFOL N/A 02/18/2020   Procedure: ESOPHAGOGASTRODUODENOSCOPY (EGD) WITH PROPOFOL;  Surgeon: Charles Bill, MD;  Location: [facility] ENDOSCOPY;  Service: Endoscopy;  Laterality: N/A;   ESOPHAGOGASTRODUODENOSCOPY (EGD) WITH PROPOFOL N/A 08/02/2021   Procedure: ESOPHAGOGASTRODUODENOSCOPY (EGD) WITH PROPOFOL;  Surgeon: Charles Bill, MD;  Location: [facility] ENDOSCOPY;  Service: Endoscopy;  Laterality: N/A;  REQUEST AM   ESOPHAGOGASTRODUODENOSCOPY (EGD) WITH PROPOFOL N/A 10/05/2021   Procedure: ESOPHAGOGASTRODUODENOSCOPY (EGD) WITH PROPOFOL;  Surgeon: Charles Bill, MD;  Location: [facility] ENDOSCOPY;  Service: Endoscopy;  Laterality: N/A;   FRACTURE SURGERY Right 05/28/2019   distal radius fracture   HERNIA REPAIR     left inguinal hernia repair as  an infant   KYPHOPLASTY N/A 02/11/2016   Procedure: KYPHOPLASTY  L2,T9;  Surgeon: Charles Bucker, MD;  Location: [facility] ORS;  Service: Orthopedics;  Laterality: N/A;   KYPHOPLASTY N/A 03/15/2016   Procedure: KYPHOPLASTY L3;  Surgeon: Charles Bucker, MD;  Location: [facility] ORS;  Service: Orthopedics;  Laterality: N/A;   OPEN REDUCTION INTERNAL FIXATION (ORIF) DISTAL RADIAL FRACTURE Right 05/28/2019   Procedure: OPEN REDUCTION INTERNAL FIXATION (ORIF) DISTAL RADIAL FRACTURE;  Surgeon: Charles Bucker, MD;  Location: [facility] ORS;  Service: Orthopedics;  Laterality: Right;   skin cancer removed     TONSILLECTOMY       Subjective Assessment -      Subjective  Pt reports doing well today. Pt denies any recent falls/stumbles since prior session. Pt denies any updates to medications or medical appointment since prior session. Pt reports he was able to walk for 2 x 15 min durations earlier today when walking his dog.       Pertinent History Charles Stanton is a 66yoM who was prescribed with Parkinson's Disease ~ 10 years.  Pt is followed by neurology, reports good reponse to medications which help control his tremors somewhat. Pt uses a SPC for AMB, also uses a walker for longer distance walking. Pt goes into community almost daily (they like to eat out) which includes lots of SPC [place] eand navigation of 4 stairs at home.    Currently in Pain? No/denies             TREATMENT:  Unless otherwise stated, CGA was provided and gait belt donned in order to ensure pt safety   TE:   Octane fitness level 3 x 6 min for aerobic priming   - Ambulation x150 ft to large stairwell thin pt ascends 6 steps with handrail on R ascending with quad cane using step to pattern, completes steps x 2 then ambulates back to clinic  NMR  Seated PWR! Moves with 1# wrist weights donned x 10 ea  -UP, ROCK, TWIST , and STEP    Transfer to supine on mat table and pt instructed in supine PWR! Moves for improving bed mobility as well as  normal targets of PWR! Moves. X 10 ea and to ea side when indicated   Transfer to mat table then to modified all 4s with hands on 24 inch block ( CGA getting into position as pt was not comfortable getting here.  - modified all 4s PWR! rock x 10, cues for proper form for adequate range of motion     PWR! Up works on postural strengthening and antigravity extension, PRW! Rock working on [employer] shifting, PRW! Twist targeting trunk rotation and PRW! Step targeting transition movements. PWR! Moves target bradykinesia, rigidity, and dyskinesia through targeted functional movements that address four core movement difficulties for people with Parkinson's disease.     PT Education -     Education Details Pt educated throughout session about proper posture and technique with exercises. Improved exercise technique, movement at target joints, use of target muscles after min to mod verbal, visual, tactile cues.      Person(s) Educated Patient    Methods  Explanation;Demonstration ; handout   Comprehension Verbalized understanding;Returned demonstration;Need further instruction           PT Short Term Goals -       PT SHORT TERM GOAL #1   Title Pt to report regular performance of exercises prescribed for home and a sense of improvements in strength and balance AEB them not being as challenging anymore.    Baseline Will issue on visit 2    Time 4    Period Weeks    Status met   Target Date 03/15/22      PT SHORT TERM GOAL #2   Title Pt to demonstrate improved power AEB improved 5xSTS hands free in <18sec.    Baseline 03/01/22: 22sec hands free, author provideing foot block bilat 03/31/22: 14.82sec 4/23:17.3 sec   Time 4    Period Weeks    Status Met    Target Date 03/29/22               PT Long Term Goals - Target goal date for all remaining long term goals is 07/19/2022        PT LONG TERM GOAL #1   Title Pt to improve score on FOTO survey to 57 to indicate reduced  difficulty with basic mobility required for ADL performance.    Baseline 03/01/22:49 04/29/22:56%. 06/16/22: 60   Time 8    Period Weeks    Status Met          PT LONG TERM GOAL #2   Title Pt to improve tolerance to overground AMB c SPC AEB performance >834ft.    Baseline 2/27: fatigued after 471ft lap performed in four minutes ten seconds. 03/31/22: 769ft with SPC no rest break 04/26/22: 794 ft 06/16/22: 903 ft with SPC, no rest.   Time 8    Period Weeks    Status Met         PT LONG TERM GOAL #3   Title Pt to demonstrate improved leg power AEB 5xSTS from chair <14sec hands free and without need for minGuard assist for safety and without need for foot block.    Baseline 03/01/22: 22sec hands free, feet blocked, minGuard assist  03/31/22: 14.82sec  4/23:17.3 sec 06/16/22: 13.87 sec 7/16: 11.55 sec   Time 8     Period Weeks    Status MET         PT LONG TERM GOAL #4   Title Pt to demonstrate improvement in balance performance AEB >6 improvement BERG Balance for balance assessment.    Baseline 41 on 03/03/22. 3/38/24: 42 4/23: 45 06/16/22: 48   Time 8    Period Weeks    Status Met          5.  Pt will improve distance with LRAD to 900 feet or greater in order to indicate further progress to community ambulation distances Baseline: 794 ft with SPC , 06/16/22: 903 ft with SPC. 07/19/22: 905 feet Goal status: Met  6.  Patient will improve modified DGI (with use of cane) score > 4 points or greater in order to indicate improved balance and decreased risk of falls.  Baseline: 6/18: 11 7/16: 14  Goal status: IN PROGRESS  7. Patient will be able to withstand various light perturbations while preforming ambulation with LRAD utilizing safe balance strategies in order to simulate safe and confident walking in crowded places. Baseline: Pt currently does not feel safe preforming task, could walk through cafeteria to get baseline. 7/23: 70%  confidence with activity, mild LOB with  perturbations.  Goal Status: IN PROGRESS  8. Pt will be compliant and independent with Parkinson's based HEP by reciting and demonstrating full PWR! Move sets in seated and or standing with no cueing from therapist. Baseline: Pt currently needs demonstration or verbal cueing for proper completion  7/23: cues required for proper completion with PWR! Up, rock, and twist in seated position  Goal Status: IN PROGRESS     Plan     Clinical Impression Statement Patient presents with good motivation for completion of physical therapy activities.  Patient continues to be challenged with Parkinson's wellness recovery moves specific towards his diagnosis and his impairments.  Patient challenged with bed mobility exercises this date.  Patient has inability to lie in prone position but several exercises were performed in order to improve his ability to transition to this position as well as exercises were modified in order to allow for similar therapeutic interventions. Pt will continue to benefit from skilled physical therapy intervention to address impairments, improve QOL, and attain therapy goals.       Personal Factors and Comorbidities Time since onset of injury/illness/exacerbation;Past/Current Experience    Examination-Activity Limitations Locomotion Level;Transfers;Bed Mobility;Reach Overhead;Hygiene/Grooming;Dressing;Toileting;Stairs;Bend    Stability/Clinical Decision Making Evolving/Moderate complexity    Clinical Decision Making Moderate    Rehab Potential Good    PT Frequency 2x / week    PT Duration 8 weeks    PT Treatment/Interventions ADLs/Self Care Home Management;Moist Heat;Gait training;Stair training;Functional mobility training;Therapeutic activities;Therapeutic exercise;Balance training;Neuromuscular re-education;Patient/family education    PT Next Visit Plan .  Continue POC toward goals.    PT Home Exercise Plan 03/01/22: discussed finding and elevated sit surface for future STS  exercises (something ~18-19 inches high)    Consulted and Agree with Plan of Care Patient             Patient will benefit from skilled therapeutic intervention in order to improve the following deficits and impairments:  balance, strength, coordination, gait, transfers. Posture. ROM   Visit Diagnosis: Muscle weakness (generalized)  Abnormality of gait and mobility  Difficulty in walking, not elsewhere classified  Other abnormalities of gait and mobility  Unsteadiness on feet    Problem List Patient Active Problem List   Diagnosis Date Noted   Acute respiratory failure due to COVID-19 (HCC) 05/28/2019   Alcohol dependence with uncomplicated withdrawal (HCC) 01/23/2018   Alcohol withdrawal (HCC) 01/23/2018   GIB (gastrointestinal bleeding) 12/09/2017   A-fib (HCC) 12/09/2017   Esophageal obstruction 10/10/2016   Esophageal ulceration 10/10/2016   GI bleed 09/20/2016   Low vitamin B12 level 03/18/2016   S/P kyphoplasty 03/18/2016   Basal cell carcinoma 02/08/2016   Atrial fibrillation (HCC) 01/27/2016   Colitis 01/27/2016   HLD (hyperlipidemia) 01/27/2016   Anxiety 01/27/2016   Hypokalemia 01/27/2016   Major depressive disorder, recurrent episode, moderate (HCC) 04/14/2015   Anxiety, generalized 12/06/2013   Parkinson disease 08/07/2013   Dysphagia 05/12/2013   Esophageal mass 05/12/2013   GERD (gastroesophageal reflux disease) 05/12/2013   Vomiting 05/12/2013   Open bite of lower leg 05/10/2013   Dog bite of lower leg 05/10/2013   Abnormal finding on liver function 03/25/2013   Benign neoplasm 03/25/2013   Dizziness 03/25/2013   Type 2 diabetes mellitus (HCC) 03/25/2013   BP (high blood pressure) 03/25/2013   Awareness of heartbeats 03/25/2013   Pure hypercholesterolemia 03/25/2013   Infective tonsillitis 03/25/2013   Adenomatous polyp 03/25/2013      Norman Herrlich  PT ,DPT Physical Therapist- [place]  [facility]

## 2022-09-06 ENCOUNTER — Encounter: Payer: Self-pay | Admitting: Occupational Therapy

## 2022-09-06 ENCOUNTER — Ambulatory Visit: Payer: Medicare Other | Admitting: Occupational Therapy

## 2022-09-06 ENCOUNTER — Ambulatory Visit: Payer: Medicare Other | Attending: Neurology | Admitting: Physical Therapy

## 2022-09-06 DIAGNOSIS — R2689 Other abnormalities of gait and mobility: Secondary | ICD-10-CM | POA: Insufficient documentation

## 2022-09-06 DIAGNOSIS — R269 Unspecified abnormalities of gait and mobility: Secondary | ICD-10-CM | POA: Diagnosis present

## 2022-09-06 DIAGNOSIS — R278 Other lack of coordination: Secondary | ICD-10-CM | POA: Diagnosis present

## 2022-09-06 DIAGNOSIS — R2681 Unsteadiness on feet: Secondary | ICD-10-CM | POA: Diagnosis present

## 2022-09-06 DIAGNOSIS — R262 Difficulty in walking, not elsewhere classified: Secondary | ICD-10-CM | POA: Diagnosis present

## 2022-09-06 DIAGNOSIS — M6281 Muscle weakness (generalized): Secondary | ICD-10-CM

## 2022-09-06 NOTE — Therapy (Signed)
Outpatient Physical Therapy Treatment    Patient Details  Name: Charles Stanton MRN: [id number] Date of Birth: 20-Oct-1955 Referring Provider (PT): Dr. Cristopher Stanton   Encounter Date: 09/06/2022  END OF SESSION:    PT End of Session - 09/06/22 1404     Visit Number 49    Number of Visits 62    Date for PT Re-Evaluation 10/11/22    Authorization Type Medicare A & B primary; federal employee secondary    Authorization Time Period -10/11/22    Progress Note Due on Visit 50    PT Start Time 1400    PT Stop Time 1443    PT Time Calculation (min) 43 min    Equipment Utilized During Treatment Gait belt    Activity Tolerance Patient tolerated treatment well;No increased pain    Behavior During Therapy [facility] for tasks assessed/performed                            Past Medical History:  Diagnosis Date   Abnormal liver function    Anemia    Anxiety    Atrial fibrillation (HCC)    Cancer (HCC) 2017   skin   Colonic mass    Depression    Dizziness    Dry cough    History of colon polyps    Hypercholesteremia    Hyperlipemia    Hypertension    Palpitations    Parkinson's disease    Parkinson's disease    PVD (peripheral vascular disease) (HCC)    Tonsillitis 03/25/2013   Tremors of nervous system     Past Surgical History:  Procedure Laterality Date   BACK SURGERY     Kyphoplasty   March 2018   COLONOSCOPY     COLONOSCOPY WITH PROPOFOL N/A 12/12/2017   Procedure: COLONOSCOPY WITH PROPOFOL;  Surgeon: Charles Mood, MD;  Location: [facility] ENDOSCOPY;  Service: Gastroenterology;  Laterality: N/A;   ESOPHAGOGASTRODUODENOSCOPY N/A 12/11/2017   Procedure: ESOPHAGOGASTRODUODENOSCOPY (EGD);  Surgeon: Charles Mood, MD;  Location: [facility] ENDOSCOPY;  Service: Gastroenterology;  Laterality: N/A;   ESOPHAGOGASTRODUODENOSCOPY N/A 10/08/2021   Procedure: ESOPHAGOGASTRODUODENOSCOPY (EGD);  Surgeon: Charles Bill, MD;  Location: [facility] ENDOSCOPY;  Service: Endoscopy;   Laterality: N/A;   ESOPHAGOGASTRODUODENOSCOPY (EGD) WITH PROPOFOL N/A 09/21/2016   Procedure: ESOPHAGOGASTRODUODENOSCOPY (EGD) WITH PROPOFOL;  Surgeon: Charles Mood, MD;  Location: [facility] ENDOSCOPY;  Service: Gastroenterology;  Laterality: N/A;   ESOPHAGOGASTRODUODENOSCOPY (EGD) WITH PROPOFOL N/A 11/07/2016   Procedure: ESOPHAGOGASTRODUODENOSCOPY (EGD) WITH PROPOFOL;  Surgeon: Charles Mood, MD;  Location: [facility] ENDOSCOPY;  Service: Gastroenterology;  Laterality: N/A;   ESOPHAGOGASTRODUODENOSCOPY (EGD) WITH PROPOFOL N/A 10/15/2019   Procedure: ESOPHAGOGASTRODUODENOSCOPY (EGD) WITH PROPOFOL;  Surgeon: Charles Bill, MD;  Location: [facility] ENDOSCOPY;  Service: Endoscopy;  Laterality: N/A;   ESOPHAGOGASTRODUODENOSCOPY (EGD) WITH PROPOFOL N/A 10/28/2019   Procedure: ESOPHAGOGASTRODUODENOSCOPY (EGD) WITH PROPOFOL;  Surgeon: Charles Bill, MD;  Location: [facility] ENDOSCOPY;  Service: Endoscopy;  Laterality: N/A;   ESOPHAGOGASTRODUODENOSCOPY (EGD) WITH PROPOFOL N/A 12/02/2019   Procedure: ESOPHAGOGASTRODUODENOSCOPY (EGD) WITH PROPOFOL;  Surgeon: Charles Bill, MD;  Location: [facility] ENDOSCOPY;  Service: Endoscopy;  Laterality: N/A;   ESOPHAGOGASTRODUODENOSCOPY (EGD) WITH PROPOFOL N/A 02/18/2020   Procedure: ESOPHAGOGASTRODUODENOSCOPY (EGD) WITH PROPOFOL;  Surgeon: Charles Bill, MD;  Location: [facility] ENDOSCOPY;  Service: Endoscopy;  Laterality: N/A;   ESOPHAGOGASTRODUODENOSCOPY (EGD) WITH PROPOFOL N/A 08/02/2021   Procedure: ESOPHAGOGASTRODUODENOSCOPY (EGD) WITH PROPOFOL;  Surgeon: Charles Bill, MD;  Location: [facility] ENDOSCOPY;  Service:  Endoscopy;  Laterality: N/A;  REQUEST AM   ESOPHAGOGASTRODUODENOSCOPY (EGD) WITH PROPOFOL N/A 10/05/2021   Procedure: ESOPHAGOGASTRODUODENOSCOPY (EGD) WITH PROPOFOL;  Surgeon: Charles Bill, MD;  Location: [facility] ENDOSCOPY;  Service: Endoscopy;  Laterality: N/A;   FRACTURE SURGERY Right 05/28/2019   distal radius fracture   HERNIA REPAIR     left inguinal  hernia repair as an infant   KYPHOPLASTY N/A 02/11/2016   Procedure: KYPHOPLASTY  L2,T9;  Surgeon: Charles Bucker, MD;  Location: [facility] ORS;  Service: Orthopedics;  Laterality: N/A;   KYPHOPLASTY N/A 03/15/2016   Procedure: KYPHOPLASTY L3;  Surgeon: Charles Bucker, MD;  Location: [facility] ORS;  Service: Orthopedics;  Laterality: N/A;   OPEN REDUCTION INTERNAL FIXATION (ORIF) DISTAL RADIAL FRACTURE Right 05/28/2019   Procedure: OPEN REDUCTION INTERNAL FIXATION (ORIF) DISTAL RADIAL FRACTURE;  Surgeon: Charles Bucker, MD;  Location: [facility] ORS;  Service: Orthopedics;  Laterality: Right;   skin cancer removed     TONSILLECTOMY       Subjective Assessment -      Subjective  Pt reports doing well today. He comes immediately form OT session. No reported changes medically since last session.       Pertinent History Charles Stanton is a 66yoM who was prescribed with Parkinson's Disease ~ 10 years.  Pt is followed by neurology, reports good reponse to medications which help control his tremors somewhat. Pt uses a SPC for AMB, also uses a walker for longer distance walking. Pt goes into community almost daily (they like to eat out) which includes lots of SPC [place] eand navigation of 4 stairs at home.    Currently in Pain? No/denies             TREATMENT:  Unless otherwise stated, CGA was provided and gait belt donned in order to ensure pt safety   TE:   Octane fitness level 3 x 6 min for aerobic priming   Seated PWR! Moves with 1# wrist weights donned x 10 ea  -UP, ROCK, TWIST , and STEP with hand boost for up and rock   Ambulation to outdoor healing garden for variable surface and gradient training with SPC throughout.  -pt showing general increased tremors this date which effects his gait pattern as well. Pt has difficulty maintaining cane sequencing without cues but with cues is able to keep sequencing approx 50% of time. 3 bouts with ranges variable from 500-600 ft with most outdoor ambulation and  ambulation in grass in second bout. Grass ambulation at increased difficulty and fatigability compared to regular surface ambulation.   Pt required occasional rest breaks due fatigue, PT was quick to ask when pt appeared to be fatiguing in order to prevent excessive fatigue.  PWR! Up works on postural strengthening and antigravity extension, PRW! Rock working on [employer] shifting, PRW! Twist targeting trunk rotation and PRW! Step targeting transition movements. PWR! Moves target bradykinesia, rigidity, and dyskinesia through targeted functional movements that address four core movement difficulties for people with Parkinson's disease.     PT Education -     Education Details Pt educated throughout session about proper posture and technique with exercises. Improved exercise technique, movement at target joints, use of target muscles after min to mod verbal, visual, tactile cues.      Person(s) Educated Patient    Methods Explanation;Demonstration ; handout   Comprehension Verbalized understanding;Returned demonstration;Need further instruction           PT Short Term Goals -  PT SHORT TERM GOAL #1   Title Pt to report regular performance of exercises prescribed for home and a sense of improvements in strength and balance AEB them not being as challenging anymore.    Baseline Will issue on visit 2    Time 4    Period Weeks    Status met   Target Date 03/15/22      PT SHORT TERM GOAL #2   Title Pt to demonstrate improved power AEB improved 5xSTS hands free in <18sec.    Baseline 03/01/22: 22sec hands free, author provideing foot block bilat 03/31/22: 14.82sec 4/23:17.3 sec   Time 4    Period Weeks    Status Met    Target Date 03/29/22               PT Long Term Goals - Target goal date for all remaining long term goals is 07/19/2022        PT LONG TERM GOAL #1   Title Pt to improve score on FOTO survey to 57 to indicate reduced difficulty with basic  mobility required for ADL performance.    Baseline 03/01/22:49 04/29/22:56%. 06/16/22: 60   Time 8    Period Weeks    Status Met          PT LONG TERM GOAL #2   Title Pt to improve tolerance to overground AMB c SPC AEB performance >848ft.    Baseline 2/27: fatigued after 463ft lap performed in four minutes ten seconds. 03/31/22: 740ft with SPC no rest break 04/26/22: 794 ft 06/16/22: 903 ft with SPC, no rest.   Time 8    Period Weeks    Status Met         PT LONG TERM GOAL #3   Title Pt to demonstrate improved leg power AEB 5xSTS from chair <14sec hands free and without need for minGuard assist for safety and without need for foot block.    Baseline 03/01/22: 22sec hands free, feet blocked, minGuard assist  03/31/22: 14.82sec  4/23:17.3 sec 06/16/22: 13.87 sec 7/16: 11.55 sec   Time 8     Period Weeks    Status MET         PT LONG TERM GOAL #4   Title Pt to demonstrate improvement in balance performance AEB >6 improvement BERG Balance for balance assessment.    Baseline 41 on 03/03/22. 3/38/24: 42 4/23: 45 06/16/22: 48   Time 8    Period Weeks    Status Met          5.  Pt will improve distance with LRAD to 900 feet or greater in order to indicate further progress to community ambulation distances Baseline: 794 ft with SPC , 06/16/22: 903 ft with SPC. 07/19/22: 905 feet Goal status: Met  6.  Patient will improve modified DGI (with use of cane) score > 4 points or greater in order to indicate improved balance and decreased risk of falls.  Baseline: 6/18: 11 7/16: 14  Goal status: IN PROGRESS  7. Patient will be able to withstand various light perturbations while preforming ambulation with LRAD utilizing safe balance strategies in order to simulate safe and confident walking in crowded places. Baseline: Pt currently does not feel safe preforming task, could walk through cafeteria to get baseline. 7/23: 70% confidence with activity, mild LOB with perturbations.  Goal Status: IN  PROGRESS  8. Pt will be compliant and independent with Parkinson's based HEP by reciting and demonstrating full PWR! Move  sets in seated and or standing with no cueing from therapist. Baseline: Pt currently needs demonstration or verbal cueing for proper completion  7/23: cues required for proper completion with PWR! Up, rock, and twist in seated position  Goal Status: IN PROGRESS     Plan     Clinical Impression Statement Patient presents with good motivation for completion of physical therapy activities.  Patient continues to be challenged with Parkinson's wellness recovery moves specific towards his diagnosis and his impairments.  Pt showing increased fatigue and tremors this date causing increased need for therapeutic rest breaks.  Pt will continue to benefit from skilled physical therapy intervention to address impairments, improve QOL, and attain therapy goals.       Personal Factors and Comorbidities Time since onset of injury/illness/exacerbation;Past/Current Experience    Examination-Activity Limitations Locomotion Level;Transfers;Bed Mobility;Reach Overhead;Hygiene/Grooming;Dressing;Toileting;Stairs;Bend    Stability/Clinical Decision Making Evolving/Moderate complexity    Clinical Decision Making Moderate    Rehab Potential Good    PT Frequency 2x / week    PT Duration 8 weeks    PT Treatment/Interventions ADLs/Self Care Home Management;Moist Heat;Gait training;Stair training;Functional mobility training;Therapeutic activities;Therapeutic exercise;Balance training;Neuromuscular re-education;Patient/family education    PT Next Visit Plan .  Continue POC toward goals.    PT Home Exercise Plan 03/01/22: discussed finding and elevated sit surface for future STS exercises (something ~18-19 inches high)    Consulted and Agree with Plan of Care Patient             Patient will benefit from skilled therapeutic intervention in order to improve the following deficits and impairments:   balance, strength, coordination, gait, transfers. Posture. ROM   Visit Diagnosis: Muscle weakness (generalized)  Abnormality of gait and mobility  Difficulty in walking, not elsewhere classified  Other abnormalities of gait and mobility  Unsteadiness on feet    Problem List Patient Active Problem List   Diagnosis Date Noted   Acute respiratory failure due to COVID-19 (HCC) 05/28/2019   Alcohol dependence with uncomplicated withdrawal (HCC) 01/23/2018   Alcohol withdrawal (HCC) 01/23/2018   GIB (gastrointestinal bleeding) 12/09/2017   A-fib (HCC) 12/09/2017   Esophageal obstruction 10/10/2016   Esophageal ulceration 10/10/2016   GI bleed 09/20/2016   Low vitamin B12 level 03/18/2016   S/P kyphoplasty 03/18/2016   Basal cell carcinoma 02/08/2016   Atrial fibrillation (HCC) 01/27/2016   Colitis 01/27/2016   HLD (hyperlipidemia) 01/27/2016   Anxiety 01/27/2016   Hypokalemia 01/27/2016   Major depressive disorder, recurrent episode, moderate (HCC) 04/14/2015   Anxiety, generalized 12/06/2013   Parkinson disease 08/07/2013   Dysphagia 05/12/2013   Esophageal mass 05/12/2013   GERD (gastroesophageal reflux disease) 05/12/2013   Vomiting 05/12/2013   Open bite of lower leg 05/10/2013   Dog bite of lower leg 05/10/2013   Abnormal finding on liver function 03/25/2013   Benign neoplasm 03/25/2013   Dizziness 03/25/2013   Type 2 diabetes mellitus (HCC) 03/25/2013   BP (high blood pressure) 03/25/2013   Awareness of heartbeats 03/25/2013   Pure hypercholesterolemia 03/25/2013   Infective tonsillitis 03/25/2013   Adenomatous polyp 03/25/2013      Norman Herrlich PT ,DPT Physical Therapist-   [facility]

## 2022-09-06 NOTE — Therapy (Signed)
Occupational Therapy Neuro Treatment Note   Patient Name: Charles Stanton MRN: [id number] DOB:11-26-1955, 67 y.o., male Today]'s Date: 09/06/2022  PCP: Dr. Barbette Reichmann REFERRING PROVIDER: Dr. Cristopher Peru  END OF SESSION:  OT End of Session - 09/06/22 1628     Visit Number 22    Number of Visits 48    Date for OT Re-Evaluation 11/15/22    OT Start Time 1315    OT Stop Time 1400    OT Time Calculation (min) 45 min    Activity Tolerance Patient tolerated treatment well    Behavior During Therapy [facility] for tasks assessed/performed             Past Medical History:  Diagnosis Date   Abnormal liver function    Anemia    Anxiety    Atrial fibrillation (HCC)    Cancer (HCC) 2017   skin   Colonic mass    Depression    Dizziness    Dry cough    History of colon polyps    Hypercholesteremia    Hyperlipemia    Hypertension    Palpitations    Parkinson's disease    Parkinson's disease    PVD (peripheral vascular disease) (HCC)    Tonsillitis 03/25/2013   Tremors of nervous system    Past Surgical History:  Procedure Laterality Date   BACK SURGERY     Kyphoplasty   March 2018   COLONOSCOPY     COLONOSCOPY WITH PROPOFOL N/A 12/12/2017   Procedure: COLONOSCOPY WITH PROPOFOL;  Surgeon: Wyline Mood, MD;  Location: [facility] ENDOSCOPY;  Service: Gastroenterology;  Laterality: N/A;   ESOPHAGOGASTRODUODENOSCOPY N/A 12/11/2017   Procedure: ESOPHAGOGASTRODUODENOSCOPY (EGD);  Surgeon: Wyline Mood, MD;  Location: [facility] ENDOSCOPY;  Service: Gastroenterology;  Laterality: N/A;   ESOPHAGOGASTRODUODENOSCOPY N/A 10/08/2021   Procedure: ESOPHAGOGASTRODUODENOSCOPY (EGD);  Surgeon: Regis Bill, MD;  Location: [facility] ENDOSCOPY;  Service: Endoscopy;  Laterality: N/A;   ESOPHAGOGASTRODUODENOSCOPY (EGD) WITH PROPOFOL N/A 09/21/2016   Procedure: ESOPHAGOGASTRODUODENOSCOPY (EGD) WITH PROPOFOL;  Surgeon: Wyline Mood, MD;  Location: [facility] ENDOSCOPY;  Service: Gastroenterology;  Laterality: N/A;    ESOPHAGOGASTRODUODENOSCOPY (EGD) WITH PROPOFOL N/A 11/07/2016   Procedure: ESOPHAGOGASTRODUODENOSCOPY (EGD) WITH PROPOFOL;  Surgeon: Wyline Mood, MD;  Location: [facility] ENDOSCOPY;  Service: Gastroenterology;  Laterality: N/A;   ESOPHAGOGASTRODUODENOSCOPY (EGD) WITH PROPOFOL N/A 10/15/2019   Procedure: ESOPHAGOGASTRODUODENOSCOPY (EGD) WITH PROPOFOL;  Surgeon: Regis Bill, MD;  Location: [facility] ENDOSCOPY;  Service: Endoscopy;  Laterality: N/A;   ESOPHAGOGASTRODUODENOSCOPY (EGD) WITH PROPOFOL N/A 10/28/2019   Procedure: ESOPHAGOGASTRODUODENOSCOPY (EGD) WITH PROPOFOL;  Surgeon: Regis Bill, MD;  Location: [facility] ENDOSCOPY;  Service: Endoscopy;  Laterality: N/A;   ESOPHAGOGASTRODUODENOSCOPY (EGD) WITH PROPOFOL N/A 12/02/2019   Procedure: ESOPHAGOGASTRODUODENOSCOPY (EGD) WITH PROPOFOL;  Surgeon: Regis Bill, MD;  Location: [facility] ENDOSCOPY;  Service: Endoscopy;  Laterality: N/A;   ESOPHAGOGASTRODUODENOSCOPY (EGD) WITH PROPOFOL N/A 02/18/2020   Procedure: ESOPHAGOGASTRODUODENOSCOPY (EGD) WITH PROPOFOL;  Surgeon: Regis Bill, MD;  Location: [facility] ENDOSCOPY;  Service: Endoscopy;  Laterality: N/A;   ESOPHAGOGASTRODUODENOSCOPY (EGD) WITH PROPOFOL N/A 08/02/2021   Procedure: ESOPHAGOGASTRODUODENOSCOPY (EGD) WITH PROPOFOL;  Surgeon: Regis Bill, MD;  Location: [facility] ENDOSCOPY;  Service: Endoscopy;  Laterality: N/A;  REQUEST AM   ESOPHAGOGASTRODUODENOSCOPY (EGD) WITH PROPOFOL N/A 10/05/2021   Procedure: ESOPHAGOGASTRODUODENOSCOPY (EGD) WITH PROPOFOL;  Surgeon: Regis Bill, MD;  Location: [facility] ENDOSCOPY;  Service: Endoscopy;  Laterality: N/A;   FRACTURE SURGERY Right 05/28/2019   distal radius fracture   HERNIA REPAIR     left inguinal  hernia repair as an infant   KYPHOPLASTY N/A 02/11/2016   Procedure: KYPHOPLASTY  L2,T9;  Surgeon: Kennedy Bucker, MD;  Location: [facility] ORS;  Service: Orthopedics;  Laterality: N/A;   KYPHOPLASTY N/A 03/15/2016   Procedure: KYPHOPLASTY L3;  Surgeon:  Kennedy Bucker, MD;  Location: [facility] ORS;  Service: Orthopedics;  Laterality: N/A;   OPEN REDUCTION INTERNAL FIXATION (ORIF) DISTAL RADIAL FRACTURE Right 05/28/2019   Procedure: OPEN REDUCTION INTERNAL FIXATION (ORIF) DISTAL RADIAL FRACTURE;  Surgeon: Kennedy Bucker, MD;  Location: [facility] ORS;  Service: Orthopedics;  Laterality: Right;   skin cancer removed     TONSILLECTOMY     Patient Active Problem List   Diagnosis Date Noted   Acute respiratory failure due to COVID-19 (HCC) 05/28/2019   Alcohol dependence with uncomplicated withdrawal (HCC) 01/23/2018   Alcohol withdrawal (HCC) 01/23/2018   GIB (gastrointestinal bleeding) 12/09/2017   A-fib (HCC) 12/09/2017   Esophageal obstruction 10/10/2016   Esophageal ulceration 10/10/2016   GI bleed 09/20/2016   Low vitamin B12 level 03/18/2016   S/P kyphoplasty 03/18/2016   Basal cell carcinoma 02/08/2016   Atrial fibrillation (HCC) 01/27/2016   Colitis 01/27/2016   HLD (hyperlipidemia) 01/27/2016   Anxiety 01/27/2016   Hypokalemia 01/27/2016   Major depressive disorder, recurrent episode, moderate (HCC) 04/14/2015   Anxiety, generalized 12/06/2013   Parkinson disease 08/07/2013   Dysphagia 05/12/2013   Esophageal mass 05/12/2013   GERD (gastroesophageal reflux disease) 05/12/2013   Vomiting 05/12/2013   Open bite of lower leg 05/10/2013   Dog bite of lower leg 05/10/2013   Abnormal finding on liver function 03/25/2013   Benign neoplasm 03/25/2013   Dizziness 03/25/2013   Type 2 diabetes mellitus (HCC) 03/25/2013   BP (high blood pressure) 03/25/2013   Awareness of heartbeats 03/25/2013   Pure hypercholesterolemia 03/25/2013   Infective tonsillitis 03/25/2013   Adenomatous polyp 03/25/2013   ONSET DATE: 10 years   REFERRING DIAG: Parkinson's Disease  THERAPY DIAG:  Muscle weakness (generalized)  Rationale for Evaluation and Treatment: Rehabilitation  SUBJECTIVE:  SUBJECTIVE STATEMENT: Pt. Reports having had a good weekend Pt  accompanied by: self; spouse present in lobby, but did not follow for eval  PERTINENT HISTORY: Pt has been working with outpatient PT here at [facility] since end of Feb 2024.  OT ordered d/t worsening PD symptoms contributing to functional decline with ADLs.   PRECAUTIONS: Fall  WEIGHT BEARING RESTRICTIONS: No  PAIN:  Are you having pain? No pain reported today.  FALLS: Has patient fallen in last 6 months? Yes. Number of falls 1  LIVING ENVIRONMENT: Lives with: lives with their spouse Lives in: split level but pt remains on main level to avoid steps Stairs: Yes: Internal: 2 steps; can reach both Has following equipment at home: Quad cane small base, Walker - 4 wheeled, bed side commode, Grab bars, and pull down seat for shower, hand held shower hose,   PLOF: Needs assistance with ADLs; pt reports that spouse assists but is consistent to always let pt try things first before she automatically helps.    PATIENT GOALS: To be more indep  OBJECTIVE:  HAND DOMINANCE: Right (most affected side from PD)  ADLs: Overall ADLs: spouse is primary caregiver Transfers/ambulation related to ADLs: uses RW Eating: spouse cuts food; pt reports having to use L non-dominant hand to eat for the last 5-6 yrs Grooming: set up; assist to squeeze toothpaste and denture paste; uses L non-dominant hand UB Dressing: set up; assist with clothing fasteners LB Dressing: Min A;  assist with socks, dons slip on shoes with set up (has tried a sockaid but was unsuccessful) Toileting: modified indep Bathing: Set up/min A; spouse squeezes shampoo into pt's hands, spouse helps to towel dry  Tub Shower transfers: distant supv Equipment:  see above   IADLs: Shopping: pt can accompany spouse by pushing a shopping cart.  Light housekeeping: pt can drag trash to bin and can roll the bin to the road and back Meal Prep: spouse manages  Community mobility: spouse drives; uses rollator for longer distances  Medication  management: spouse manages pills using weekly pill organizer.   Financial management: spouse manages  Handwriting: Mild micrographia and <25% legible  MOBILITY STATUS: Hx of falls  POSTURE COMMENTS:  rounded shoulders and forward head, R shoulder elevated  ACTIVITY TOLERANCE: Activity tolerance: TBD and assessed further with functional activities   FUNCTIONAL OUTCOME MEASURES: FOTO: TBD   UPPER EXTREMITY ROM:    Active ROM Right eval Left eval Right 07/12/2022 Left 07/12/2022 Right 08/28/2022 Left  08/28/2022  Shoulder flexion 118 85 120 117 124 120  Shoulder abduction 128 124 132 127 132 130  Shoulder adduction        Shoulder extension        Shoulder internal rotation [facility] WFL  Shoulder external rotation [facility] WFL  Elbow flexion        Elbow extension        Wrist flexion 55 76 59 75 59 75  Wrist extension 57 75 61 71 61 71  Wrist ulnar deviation        Wrist radial deviation        Wrist pronation [facility] WFL  Wrist supination 63 75 66 81 55 81  (Blank rows = not tested)  UPPER EXTREMITY MMT:     MMT Right eval Left eval Right 07/12/2022 Left 07/12/2022 Right 08/23/2022 Left 08/23/2022  Shoulder flexion 4 4 4+ 4+ 4+ 4+  Shoulder abduction 4+ 4+ 4+ 4+ 4+ 4+  Shoulder adduction        Shoulder extension        Shoulder internal rotation        Shoulder external rotation        Middle trapezius        Lower trapezius        Elbow flexion 4+ 4+ 4+ 4+ 5 5  Elbow extension 4+ 4+ 4+ 4+ 4+ 4+  Wrist flexion 4+ 4+ 4+ 4+ 4+ 4+  Wrist extension 4+ 4+ 4+ 4+ 4+ 4+  Wrist ulnar deviation        Wrist radial deviation        Wrist pronation        Wrist supination        (Blank rows = not tested)  HAND FUNCTION: Grip strength: Right: 16 lbs; Left: 38 lbs, Lateral pinch: Right: 10 lbs, Left: 14 lbs, and 3 point pinch: Right: 9 lbs, Left: 11 lbs 07/12/2022: Grip strength: Right: 16 lbs; Left: 40 lbs, Lateral pinch: Right: 10 lbs,  Left: 14 lbs, and 3 point pinch: Right: 9 lbs, Left: 15 lbs  08/23/2022: Grip strength: Right: 7 lbs; Left: 30 lbs, Lateral pinch: Right: 10 lbs, Left: 14 lbs, and 3 point pinch: Right: 6 lbs, Left: 14 lbs COORDINATION: 9 Hole Peg test: Right: 38 sec; Left: 40 sec 07/12/2022: 9 Hole Peg test: Right: 35 sec; Left: 36 sec 08/23/2022: 9 Hole Peg test: Right: 51  sec; Left: 39 sec SENSATION: WFL  EDEMA: None  MUSCLE TONE: R/L normal  COGNITION: Overall cognitive status: Within functional limits for tasks assessed  VISION: Subjective report: wears glasses all the time    PRAXIS: Impaired: Motor planning  OBSERVATIONS:  R SF PIP flexion contracture 105* from 5-6 years ago from a fall  TODAY'S TREATMENT:                                                                                                                              DATE: 09/06/2022:  There. Ex.:  Pt. worked on [employer], and reciprocal motion using the UBE while seated for 8 min. with no resistance. Constant monitoring was provided. Pt. worked on bilateral gross gripping using a gripper with 1 red resistive band on the right hand, and 2 red restive bands on the left hand. Pt. performed  bilateral gross gripping with a gross grip strengthener. Pt. worked on sustaining grip while grasping pegs and reaching at various heights. The Gripper was set to  17.9# of grip strength resistance on the left, and 11.9# on the right. Pt. worked on pinch strengthening in the bilateral hand strength for lateral, and 3pt. pinch using yellow, red, green, and blue resistive clips. Pt. worked on placing the clips on a horizontal dowel. Tactile and verbal cues were required for eliciting the desired movement.  Pt. worked on digit flexion using the 1.5# DigiFlex.            PATIENT EDUCATION: Education details:  UE strengthening Person educated: Patient Education method: Explanation and Verbal cues, demo Education comprehension: verbalized  understanding, demonstrated understanding  HOME EXERCISE PROGRAM: Theraputty   GOALS: Goals reviewed with patient? Yes  SHORT TERM GOALS: Target date: 10/04/2022   (6 weeks)  Pt will be indep to perform HEP for improving bilat hand strength and coordination skills. Baseline: Eval: not yet initiated 07/12/2022: Completing theraputty exercises independent at home Goal status: Met/Achieved  LONG TERM GOALS: Target date: 11/15/2022 (12 weeks)  Pt will increase FOTO score to (TBD) or better to indicate improvement in self perceived functional use of the R arm with daily tasks. Baseline: Eval: TBD 07/12/2022: TBD Goal status: Discontinued  2.  Pt will increase R grip strength by 5 or more lbs to more easily hold and stabilize ADL supplies in the R dominant hand. Baseline: 08/25/2022: Grip strength: Right: 7 lbs; Left: 30 lbs 08/23/2022:  Grip strength: Right: 7 lbs; Left: 30 lbs R grip 16 lbs; limited from PD but also by 5th digit PIP flexion contracture, as this digit can not engage in gripping (L 38 lbs) 07/12/2022: Grip strength: Right: 16 lbs; limited from PD but also by 5th digit PIP flexion contracture, as this digit still can not engage in gripping (L 40 lbs) Goal status: Ongoing  3.  Pt will improve R hand FMC/dexterity skills to be able to sign his name on medical/legal documents with 75% legibility, using adapted  writing aids as needed. Baseline: 08/25/2022: Improving writing, however this is dependent on whether or not he is have a bad day with tremors 08/23/2022: Improving writing, however this is dependent on whether or not he is have a bad day with tremors. Eval: <25% legible and pt verbalizes embarrassment when attempting to sign his name on forms 07/12/2022: 75% legibility when writing his name in print, however reports he is still not satisfied with his signature looks. Goal status: Ongoing  4.  Pt will increase R FMC/GMC skills to engage the RUE into UB ADLs at least 50% of the  time. Baseline:  08/25/2022: Pt. Is trying to engage his right UE more during daily ADL, and IADL tasks. Eval: Pt uses the L non-dominant arm for self care tasks. 08/23/2022: Pt. Is trying to engage his right UE more during daily ADL, and IADL tasks. Eval: Pt uses the L non-dominant arm for self care tasks. 07/12/22: Pt. Reports that he has been trying to incorporate his R hand while still using the L arm to complete all self care tasks.  Goal status: Ongoing  5. Pt. Will improve typing skills  by 1 min.  In preparation for typing an email correspondence  Baseline:  08/25/2022: Pt. Typed I sentence in 1 min. & 27 sec. With the left hand. Pt. Typed 1 sentence in 1 min. & 40 sec. With bilateral hands. Limited typing engagement with the right hand.  08/23/2022: Pt. Typed I sentence in 1 min. & 27 sec. With the left hand. Pt. Typed 1 sentence in 1 min. & 40 sec. With bilateral hands. Limited typing engagement with the right hand.    Goal status: New   ASSESSMENT:  CLINICAL IMPRESSION:  Pt. tolerated the UE strengthening, grip, and pinch strengthening well today. Pt. requires cues to reposition the gripper in his hand. Pt. requires cues, and assist for hand pinch position on the clips. Pt. requires cues for visual demonstration of proper technique with each exercise. Pt. continues to present with increased tremors today which limit motor control, and coordination during ADLs/IADLs, and requires cues for strategies for proximal stabilization during tasks for more distal control. Pt. continues to benefit from skilled OT for increasing bilateral hand strength, improving coordination, specifically working to increase engagement of the RUE during self care tasks.    PERFORMANCE DEFICITS: in functional skills including ADLs, IADLs, coordination, dexterity, ROM, strength, flexibility, Fine motor control, Gross motor control, mobility, balance, body mechanics, endurance, decreased knowledge of use of DME, and UE  functional use, cognitive skills including emotional and memory, and psychosocial skills including coping strategies, environmental adaptation, habits, and routines and behaviors.   IMPAIRMENTS: are limiting patient from ADLs, IADLs, and leisure.   CO-MORBIDITIES: has co-morbidities such as anxiety, depression  that affects occupational performance. Patient will benefit from skilled OT to address above impairments and improve overall function.  MODIFICATION OR ASSISTANCE TO COMPLETE EVALUATION: No modification of tasks or assist necessary to complete an evaluation.  OT OCCUPATIONAL PROFILE AND HISTORY: Problem focused assessment: Including review of records relating to presenting problem.  CLINICAL DECISION MAKING: Moderate - several treatment options, min-mod task modification necessary  REHAB POTENTIAL: Good for goals  EVALUATION COMPLEXITY: Moderate    PLAN:  OT FREQUENCY: 2x/week  OT DURATION: 12 weeks  PLANNED INTERVENTIONS: self care/ADL training, therapeutic exercise, therapeutic activity, neuromuscular re-education, manual therapy, passive range of motion, balance training, functional mobility training, moist heat, cryotherapy, patient/family education, cognitive remediation/compensation, psychosocial skills training, energy conservation, coping strategies  training, and DME and/or AE instructions RECOMMENDED OTHER SERVICES: None at this time  CONSULTED AND AGREED WITH PLAN OF CARE: Patient  PLAN FOR NEXT SESSION: handwriting  Olegario Messier, M.S. OTR/L  09/06/22, 4:30 PM

## 2022-09-08 ENCOUNTER — Encounter: Payer: Self-pay | Admitting: Physical Therapy

## 2022-09-08 ENCOUNTER — Ambulatory Visit: Payer: Medicare Other | Admitting: Occupational Therapy

## 2022-09-08 ENCOUNTER — Ambulatory Visit: Payer: Medicare Other | Admitting: Physical Therapy

## 2022-09-08 DIAGNOSIS — R262 Difficulty in walking, not elsewhere classified: Secondary | ICD-10-CM

## 2022-09-08 DIAGNOSIS — R269 Unspecified abnormalities of gait and mobility: Secondary | ICD-10-CM

## 2022-09-08 DIAGNOSIS — M6281 Muscle weakness (generalized): Secondary | ICD-10-CM

## 2022-09-08 DIAGNOSIS — R2689 Other abnormalities of gait and mobility: Secondary | ICD-10-CM

## 2022-09-08 DIAGNOSIS — R2681 Unsteadiness on feet: Secondary | ICD-10-CM

## 2022-09-08 NOTE — Therapy (Signed)
Outpatient Physical Therapy Treatment/ Physical Therapy Progress Note   Dates of reporting period  07/28/22   to   09/08/22     Patient Details  Name: Charles Stanton MRN: [id number] Date of Birth: 04-Mar-1955 Referring Provider (PT): Dr. Cristopher Peru   Encounter Date: 09/08/2022  END OF SESSION:    PT End of Session - 09/08/22 1358     Visit Number 50    Number of Visits 62    Date for PT Re-Evaluation 10/11/22    Authorization Type Medicare A & B primary; federal employee secondary    Authorization Time Period -10/11/22    Progress Note Due on Visit 50    PT Start Time 1317    PT Stop Time 1359    PT Time Calculation (min) 42 min    Equipment Utilized During Treatment Gait belt    Activity Tolerance Patient tolerated treatment well;No increased pain    Behavior During Therapy [facility] for tasks assessed/performed                             Past Medical History:  Diagnosis Date   Abnormal liver function    Anemia    Anxiety    Atrial fibrillation (HCC)    Cancer (HCC) 2017   skin   Colonic mass    Depression    Dizziness    Dry cough    History of colon polyps    Hypercholesteremia    Hyperlipemia    Hypertension    Palpitations    Parkinson's disease    Parkinson's disease    PVD (peripheral vascular disease) (HCC)    Tonsillitis 03/25/2013   Tremors of nervous system     Past Surgical History:  Procedure Laterality Date   BACK SURGERY     Kyphoplasty   March 2018   COLONOSCOPY     COLONOSCOPY WITH PROPOFOL N/A 12/12/2017   Procedure: COLONOSCOPY WITH PROPOFOL;  Surgeon: Wyline Mood, MD;  Location: [facility] ENDOSCOPY;  Service: Gastroenterology;  Laterality: N/A;   ESOPHAGOGASTRODUODENOSCOPY N/A 12/11/2017   Procedure: ESOPHAGOGASTRODUODENOSCOPY (EGD);  Surgeon: Wyline Mood, MD;  Location: [facility] ENDOSCOPY;  Service: Gastroenterology;  Laterality: N/A;   ESOPHAGOGASTRODUODENOSCOPY N/A 10/08/2021   Procedure: ESOPHAGOGASTRODUODENOSCOPY (EGD);   Surgeon: Regis Bill, MD;  Location: [facility] ENDOSCOPY;  Service: Endoscopy;  Laterality: N/A;   ESOPHAGOGASTRODUODENOSCOPY (EGD) WITH PROPOFOL N/A 09/21/2016   Procedure: ESOPHAGOGASTRODUODENOSCOPY (EGD) WITH PROPOFOL;  Surgeon: Wyline Mood, MD;  Location: [facility] ENDOSCOPY;  Service: Gastroenterology;  Laterality: N/A;   ESOPHAGOGASTRODUODENOSCOPY (EGD) WITH PROPOFOL N/A 11/07/2016   Procedure: ESOPHAGOGASTRODUODENOSCOPY (EGD) WITH PROPOFOL;  Surgeon: Wyline Mood, MD;  Location: [facility] ENDOSCOPY;  Service: Gastroenterology;  Laterality: N/A;   ESOPHAGOGASTRODUODENOSCOPY (EGD) WITH PROPOFOL N/A 10/15/2019   Procedure: ESOPHAGOGASTRODUODENOSCOPY (EGD) WITH PROPOFOL;  Surgeon: Regis Bill, MD;  Location: [facility] ENDOSCOPY;  Service: Endoscopy;  Laterality: N/A;   ESOPHAGOGASTRODUODENOSCOPY (EGD) WITH PROPOFOL N/A 10/28/2019   Procedure: ESOPHAGOGASTRODUODENOSCOPY (EGD) WITH PROPOFOL;  Surgeon: Regis Bill, MD;  Location: [facility] ENDOSCOPY;  Service: Endoscopy;  Laterality: N/A;   ESOPHAGOGASTRODUODENOSCOPY (EGD) WITH PROPOFOL N/A 12/02/2019   Procedure: ESOPHAGOGASTRODUODENOSCOPY (EGD) WITH PROPOFOL;  Surgeon: Regis Bill, MD;  Location: [facility] ENDOSCOPY;  Service: Endoscopy;  Laterality: N/A;   ESOPHAGOGASTRODUODENOSCOPY (EGD) WITH PROPOFOL N/A 02/18/2020   Procedure: ESOPHAGOGASTRODUODENOSCOPY (EGD) WITH PROPOFOL;  Surgeon: Regis Bill, MD;  Location: [facility] ENDOSCOPY;  Service: Endoscopy;  Laterality: N/A;   ESOPHAGOGASTRODUODENOSCOPY (EGD) WITH PROPOFOL N/A  08/02/2021   Procedure: ESOPHAGOGASTRODUODENOSCOPY (EGD) WITH PROPOFOL;  Surgeon: Regis Bill, MD;  Location: [facility] ENDOSCOPY;  Service: Endoscopy;  Laterality: N/A;  REQUEST AM   ESOPHAGOGASTRODUODENOSCOPY (EGD) WITH PROPOFOL N/A 10/05/2021   Procedure: ESOPHAGOGASTRODUODENOSCOPY (EGD) WITH PROPOFOL;  Surgeon: Regis Bill, MD;  Location: [facility] ENDOSCOPY;  Service: Endoscopy;  Laterality: N/A;   FRACTURE SURGERY  Right 05/28/2019   distal radius fracture   HERNIA REPAIR     left inguinal hernia repair as an infant   KYPHOPLASTY N/A 02/11/2016   Procedure: KYPHOPLASTY  L2,T9;  Surgeon: Kennedy Bucker, MD;  Location: [facility] ORS;  Service: Orthopedics;  Laterality: N/A;   KYPHOPLASTY N/A 03/15/2016   Procedure: KYPHOPLASTY L3;  Surgeon: Kennedy Bucker, MD;  Location: [facility] ORS;  Service: Orthopedics;  Laterality: N/A;   OPEN REDUCTION INTERNAL FIXATION (ORIF) DISTAL RADIAL FRACTURE Right 05/28/2019   Procedure: OPEN REDUCTION INTERNAL FIXATION (ORIF) DISTAL RADIAL FRACTURE;  Surgeon: Kennedy Bucker, MD;  Location: [facility] ORS;  Service: Orthopedics;  Laterality: Right;   skin cancer removed     TONSILLECTOMY       Subjective Assessment -      Subjective  Pt reports doing well today. He comes immediately form OT session. No reported changes medically since last session.       Pertinent History Charles Stanton is a 66yoM who was prescribed with Parkinson's Disease ~ 10 years.  Pt is followed by neurology, reports good reponse to medications which help control his tremors somewhat. Pt uses a SPC for AMB, also uses a walker for longer distance walking. Pt goes into community almost daily (they like to eat out) which includes lots of SPC [place] eand navigation of 4 stairs at home.    Currently in Pain? No/denies             TREATMENT:  Unless otherwise stated, CGA was provided and gait belt donned in order to ensure pt safety   TE:   [employer] fitness level 2 x 7 min for aerobic priming   Seated PWR! Moves ( up, rock, twist, and step) x 10 ea with 1# wrist weights donned   STS with PWR! Mod and then PWR! Step to each side in standing then repeat x 10 reps.   Step taps to 6 in step without UE support, cues for slow and controlled movements. X 10 ea LE   Adducted stance on airex x 60 sec   1 LE on airex 1 on step 2 x 30 sec ea LE   Standing PWR! Up, twist and rock with hands on back of armchair modification x 10  to ea side   Pt required occasional rest breaks due fatigue, PT was quick to ask when pt appeared to be fatiguing in order to prevent excessive fatigue.  PWR! Up works on postural strengthening and antigravity extension, PRW! Rock working on [employer] shifting, PRW! Twist targeting trunk rotation and PRW! Step targeting transition movements. PWR! Moves target bradykinesia, rigidity, and dyskinesia through targeted functional movements that address four core movement difficulties for people with Parkinson's disease.     PT Education -     Education Details Pt educated throughout session about proper posture and technique with exercises. Improved exercise technique, movement at target joints, use of target muscles after min to mod verbal, visual, tactile cues.      Person(s) Educated Patient    Methods Explanation;Demonstration ; handout   Comprehension Verbalized understanding;Returned demonstration;Need further instruction  PT Short Term Goals -       PT SHORT TERM GOAL #1   Title Pt to report regular performance of exercises prescribed for home and a sense of improvements in strength and balance AEB them not being as challenging anymore.    Baseline Will issue on visit 2    Time 4    Period Weeks    Status met   Target Date 03/15/22      PT SHORT TERM GOAL #2   Title Pt to demonstrate improved power AEB improved 5xSTS hands free in <18sec.    Baseline 03/01/22: 22sec hands free, author provideing foot block bilat 03/31/22: 14.82sec 4/23:17.3 sec   Time 4    Period Weeks    Status Met    Target Date 03/29/22               PT Long Term Goals - Target goal date for all remaining long term goals is 07/19/2022        PT LONG TERM GOAL #1   Title Pt to improve score on FOTO survey to 57 to indicate reduced difficulty with basic mobility required for ADL performance.    Baseline 03/01/22:49 04/29/22:56%. 06/16/22: 60  09/08/22:60   Time 8    Period Weeks     Status Met          PT LONG TERM GOAL #2   Title Pt to improve tolerance to overground AMB c SPC AEB performance >839ft.    Baseline 2/27: fatigued after 415ft lap performed in four minutes ten seconds. 03/31/22: 772ft with SPC no rest break 04/26/22: 794 ft 06/16/22: 903 ft with SPC, no rest.   Time 8    Period Weeks    Status Met         PT LONG TERM GOAL #3   Title Pt to demonstrate improved leg power AEB 5xSTS from chair <14sec hands free and without need for minGuard assist for safety and without need for foot block.    Baseline 03/01/22: 22sec hands free, feet blocked, minGuard assist  03/31/22: 14.82sec  4/23:17.3 sec 06/16/22: 13.87 sec 7/16: 11.55 sec   Time 8     Period Weeks    Status MET         PT LONG TERM GOAL #4   Title Pt to demonstrate improvement in balance performance AEB >6 improvement BERG Balance for balance assessment.    Baseline 41 on 03/03/22. 3/38/24: 42 4/23: 45 06/16/22: 48   Time 8    Period Weeks    Status Met          5.  Pt will improve distance with LRAD to 900 feet or greater in order to indicate further progress to community ambulation distances Baseline: 794 ft with SPC , 06/16/22: 903 ft with SPC. 07/19/22: 905 feet Goal status: Met  6.  Patient will improve modified DGI (with use of cane) score > 4 points or greater in order to indicate improved balance and decreased risk of falls.  Baseline: 6/18: 11 7/16: 14  Goal status: IN PROGRESS  7. Patient will be able to withstand various light perturbations while preforming ambulation with LRAD utilizing safe balance strategies in order to simulate safe and confident walking in crowded places. Baseline: Pt currently does not feel safe preforming task, could walk through cafeteria to get baseline. 7/23: 70% confidence with activity, mild LOB with perturbations.  9/5: able to complete this task on multiple occasions without LOB.  Goal Status:MET   8. Pt will be compliant and independent  with Parkinson's based HEP by reciting and demonstrating full PWR! Move sets in seated and or standing with no cueing from therapist. Baseline: Pt currently needs demonstration or verbal cueing for proper completion  7/23: cues required for proper completion with PWR! Up, rock, and twist in seated position  Goal Status: IN PROGRESS     Plan     Clinical Impression Statement Patient presents for progress note this date. Pt still progressing toward his goals as expected with expected discharge at end of this certification period. Pt educated regarding community resources for Parkisnon's specific exercises and received it well. Patient continues to be challenged with Parkinson's wellness recovery moves specific towards his diagnosis and his impairments.  Pt challenged with static balance exercises and outside of tremors did well with maintaining balance in these challenging positions. Pt will continue to benefit from skilled physical therapy intervention to address impairments, improve QOL, and attain therapy goals. Patient's condition has the potential to improve in response to therapy. Maximum improvement is yet to be obtained. The anticipated improvement is attainable and reasonable in a generally predictable time.        Personal Factors and Comorbidities Time since onset of injury/illness/exacerbation;Past/Current Experience    Examination-Activity Limitations Locomotion Level;Transfers;Bed Mobility;Reach Overhead;Hygiene/Grooming;Dressing;Toileting;Stairs;Bend    Stability/Clinical Decision Making Evolving/Moderate complexity    Clinical Decision Making Moderate    Rehab Potential Good    PT Frequency 2x / week    PT Duration 8 weeks    PT Treatment/Interventions ADLs/Self Care Home Management;Moist Heat;Gait training;Stair training;Functional mobility training;Therapeutic activities;Therapeutic exercise;Balance training;Neuromuscular re-education;Patient/family education    PT Next Visit Plan  Continue plan    PT Home Exercise Plan 03/01/22: discussed finding and elevated sit surface for future STS exercises (something ~18-19 inches high)    Consulted and Agree with Plan of Care Patient             Patient will benefit from skilled therapeutic intervention in order to improve the following deficits and impairments:  balance, strength, coordination, gait, transfers. Posture. ROM   Visit Diagnosis: Muscle weakness (generalized)  Abnormality of gait and mobility  Difficulty in walking, not elsewhere classified  Other abnormalities of gait and mobility  Unsteadiness on feet    Problem List Patient Active Problem List   Diagnosis Date Noted   Acute respiratory failure due to COVID-19 (HCC) 05/28/2019   Alcohol dependence with uncomplicated withdrawal (HCC) 01/23/2018   Alcohol withdrawal (HCC) 01/23/2018   GIB (gastrointestinal bleeding) 12/09/2017   A-fib (HCC) 12/09/2017   Esophageal obstruction 10/10/2016   Esophageal ulceration 10/10/2016   GI bleed 09/20/2016   Low vitamin B12 level 03/18/2016   S/P kyphoplasty 03/18/2016   Basal cell carcinoma 02/08/2016   Atrial fibrillation (HCC) 01/27/2016   Colitis 01/27/2016   HLD (hyperlipidemia) 01/27/2016   Anxiety 01/27/2016   Hypokalemia 01/27/2016   Major depressive disorder, recurrent episode, moderate (HCC) 04/14/2015   Anxiety, generalized 12/06/2013   Parkinson disease 08/07/2013   Dysphagia 05/12/2013   Esophageal mass 05/12/2013   GERD (gastroesophageal reflux disease) 05/12/2013   Vomiting 05/12/2013   Open bite of lower leg 05/10/2013   Dog bite of lower leg 05/10/2013   Abnormal finding on liver function 03/25/2013   Benign neoplasm 03/25/2013   Dizziness 03/25/2013   Type 2 diabetes mellitus (HCC) 03/25/2013   BP (high blood pressure) 03/25/2013   Awareness of heartbeats 03/25/2013   Pure hypercholesterolemia 03/25/2013   Infective  tonsillitis 03/25/2013   Adenomatous polyp 03/25/2013       Norman Herrlich PT ,DPT Physical Therapist- [place]  [facility]

## 2022-09-08 NOTE — Therapy (Addendum)
Occupational Therapy Neuro Treatment Note   Patient Name: Charles Stanton MRN: [id number] DOB:06-14-1955, 67 y.o., male Today]'s Date: 09/08/2022  PCP: Dr. Barbette Reichmann REFERRING PROVIDER: Dr. Cristopher Peru  END OF SESSION:  OT End of Session - 09/08/22 1505     Visit Number 23    Number of Visits 48    Date for OT Re-Evaluation 11/15/22    OT Start Time 1400    OT Stop Time 1445    OT Time Calculation (min) 45 min    Activity Tolerance Patient tolerated treatment well    Behavior During Therapy [facility] for tasks assessed/performed             Past Medical History:  Diagnosis Date   Abnormal liver function    Anemia    Anxiety    Atrial fibrillation (HCC)    Cancer (HCC) 2017   skin   Colonic mass    Depression    Dizziness    Dry cough    History of colon polyps    Hypercholesteremia    Hyperlipemia    Hypertension    Palpitations    Parkinson's disease    Parkinson's disease    PVD (peripheral vascular disease) (HCC)    Tonsillitis 03/25/2013   Tremors of nervous system    Past Surgical History:  Procedure Laterality Date   BACK SURGERY     Kyphoplasty   March 2018   COLONOSCOPY     COLONOSCOPY WITH PROPOFOL N/A 12/12/2017   Procedure: COLONOSCOPY WITH PROPOFOL;  Surgeon: Wyline Mood, MD;  Location: [facility] ENDOSCOPY;  Service: Gastroenterology;  Laterality: N/A;   ESOPHAGOGASTRODUODENOSCOPY N/A 12/11/2017   Procedure: ESOPHAGOGASTRODUODENOSCOPY (EGD);  Surgeon: Wyline Mood, MD;  Location: [facility] ENDOSCOPY;  Service: Gastroenterology;  Laterality: N/A;   ESOPHAGOGASTRODUODENOSCOPY N/A 10/08/2021   Procedure: ESOPHAGOGASTRODUODENOSCOPY (EGD);  Surgeon: Regis Bill, MD;  Location: [facility] ENDOSCOPY;  Service: Endoscopy;  Laterality: N/A;   ESOPHAGOGASTRODUODENOSCOPY (EGD) WITH PROPOFOL N/A 09/21/2016   Procedure: ESOPHAGOGASTRODUODENOSCOPY (EGD) WITH PROPOFOL;  Surgeon: Wyline Mood, MD;  Location: [facility] ENDOSCOPY;  Service: Gastroenterology;  Laterality: N/A;    ESOPHAGOGASTRODUODENOSCOPY (EGD) WITH PROPOFOL N/A 11/07/2016   Procedure: ESOPHAGOGASTRODUODENOSCOPY (EGD) WITH PROPOFOL;  Surgeon: Wyline Mood, MD;  Location: [facility] ENDOSCOPY;  Service: Gastroenterology;  Laterality: N/A;   ESOPHAGOGASTRODUODENOSCOPY (EGD) WITH PROPOFOL N/A 10/15/2019   Procedure: ESOPHAGOGASTRODUODENOSCOPY (EGD) WITH PROPOFOL;  Surgeon: Regis Bill, MD;  Location: [facility] ENDOSCOPY;  Service: Endoscopy;  Laterality: N/A;   ESOPHAGOGASTRODUODENOSCOPY (EGD) WITH PROPOFOL N/A 10/28/2019   Procedure: ESOPHAGOGASTRODUODENOSCOPY (EGD) WITH PROPOFOL;  Surgeon: Regis Bill, MD;  Location: [facility] ENDOSCOPY;  Service: Endoscopy;  Laterality: N/A;   ESOPHAGOGASTRODUODENOSCOPY (EGD) WITH PROPOFOL N/A 12/02/2019   Procedure: ESOPHAGOGASTRODUODENOSCOPY (EGD) WITH PROPOFOL;  Surgeon: Regis Bill, MD;  Location: [facility] ENDOSCOPY;  Service: Endoscopy;  Laterality: N/A;   ESOPHAGOGASTRODUODENOSCOPY (EGD) WITH PROPOFOL N/A 02/18/2020   Procedure: ESOPHAGOGASTRODUODENOSCOPY (EGD) WITH PROPOFOL;  Surgeon: Regis Bill, MD;  Location: [facility] ENDOSCOPY;  Service: Endoscopy;  Laterality: N/A;   ESOPHAGOGASTRODUODENOSCOPY (EGD) WITH PROPOFOL N/A 08/02/2021   Procedure: ESOPHAGOGASTRODUODENOSCOPY (EGD) WITH PROPOFOL;  Surgeon: Regis Bill, MD;  Location: [facility] ENDOSCOPY;  Service: Endoscopy;  Laterality: N/A;  REQUEST AM   ESOPHAGOGASTRODUODENOSCOPY (EGD) WITH PROPOFOL N/A 10/05/2021   Procedure: ESOPHAGOGASTRODUODENOSCOPY (EGD) WITH PROPOFOL;  Surgeon: Regis Bill, MD;  Location: [facility] ENDOSCOPY;  Service: Endoscopy;  Laterality: N/A;   FRACTURE SURGERY Right 05/28/2019   distal radius fracture   HERNIA REPAIR     left inguinal  hernia repair as an infant   KYPHOPLASTY N/A 02/11/2016   Procedure: KYPHOPLASTY  L2,T9;  Surgeon: Kennedy Bucker, MD;  Location: [facility] ORS;  Service: Orthopedics;  Laterality: N/A;   KYPHOPLASTY N/A 03/15/2016   Procedure: KYPHOPLASTY L3;  Surgeon:  Kennedy Bucker, MD;  Location: [facility] ORS;  Service: Orthopedics;  Laterality: N/A;   OPEN REDUCTION INTERNAL FIXATION (ORIF) DISTAL RADIAL FRACTURE Right 05/28/2019   Procedure: OPEN REDUCTION INTERNAL FIXATION (ORIF) DISTAL RADIAL FRACTURE;  Surgeon: Kennedy Bucker, MD;  Location: [facility] ORS;  Service: Orthopedics;  Laterality: Right;   skin cancer removed     TONSILLECTOMY     Patient Active Problem List   Diagnosis Date Noted   Acute respiratory failure due to COVID-19 (HCC) 05/28/2019   Alcohol dependence with uncomplicated withdrawal (HCC) 01/23/2018   Alcohol withdrawal (HCC) 01/23/2018   GIB (gastrointestinal bleeding) 12/09/2017   A-fib (HCC) 12/09/2017   Esophageal obstruction 10/10/2016   Esophageal ulceration 10/10/2016   GI bleed 09/20/2016   Low vitamin B12 level 03/18/2016   S/P kyphoplasty 03/18/2016   Basal cell carcinoma 02/08/2016   Atrial fibrillation (HCC) 01/27/2016   Colitis 01/27/2016   HLD (hyperlipidemia) 01/27/2016   Anxiety 01/27/2016   Hypokalemia 01/27/2016   Major depressive disorder, recurrent episode, moderate (HCC) 04/14/2015   Anxiety, generalized 12/06/2013   Parkinson disease 08/07/2013   Dysphagia 05/12/2013   Esophageal mass 05/12/2013   GERD (gastroesophageal reflux disease) 05/12/2013   Vomiting 05/12/2013   Open bite of lower leg 05/10/2013   Dog bite of lower leg 05/10/2013   Abnormal finding on liver function 03/25/2013   Benign neoplasm 03/25/2013   Dizziness 03/25/2013   Type 2 diabetes mellitus (HCC) 03/25/2013   BP (high blood pressure) 03/25/2013   Awareness of heartbeats 03/25/2013   Pure hypercholesterolemia 03/25/2013   Infective tonsillitis 03/25/2013   Adenomatous polyp 03/25/2013   ONSET DATE: 10 years   REFERRING DIAG: Parkinson's Disease  THERAPY DIAG:  Muscle weakness (generalized)  Rationale for Evaluation and Treatment: Rehabilitation  SUBJECTIVE:  SUBJECTIVE STATEMENT:  Pt. reports that he may have to put his  13 year old dog down soon.   Pt accompanied by: self; spouse present in lobby, but did not follow for eval  PERTINENT HISTORY: Pt has been working with outpatient PT here at [facility] since end of Feb 2024.  OT ordered d/t worsening PD symptoms contributing to functional decline with ADLs.   PRECAUTIONS: Fall  WEIGHT BEARING RESTRICTIONS: No  PAIN:  Are you having pain? No pain reported today.  FALLS: Has patient fallen in last 6 months? Yes. Number of falls 1  LIVING ENVIRONMENT: Lives with: lives with their spouse Lives in: split level but pt remains on main level to avoid steps Stairs: Yes: Internal: 2 steps; can reach both Has following equipment at home: Quad cane small base, Walker - 4 wheeled, bed side commode, Grab bars, and pull down seat for shower, hand held shower hose,   PLOF: Needs assistance with ADLs; pt reports that spouse assists but is consistent to always let pt try things first before she automatically helps.    PATIENT GOALS: To be more indep  OBJECTIVE:  HAND DOMINANCE: Right (most affected side from PD)  ADLs: Overall ADLs: spouse is primary caregiver Transfers/ambulation related to ADLs: uses RW Eating: spouse cuts food; pt reports having to use L non-dominant hand to eat for the last 5-6 yrs Grooming: set up; assist to squeeze toothpaste and denture paste; uses L non-dominant hand UB  Dressing: set up; assist with clothing fasteners LB Dressing: Min A; assist with socks, dons slip on shoes with set up (has tried a sockaid but was unsuccessful) Toileting: modified indep Bathing: Set up/min A; spouse squeezes shampoo into pt's hands, spouse helps to towel dry  Tub Shower transfers: distant supv Equipment:  see above   IADLs: Shopping: pt can accompany spouse by pushing a shopping cart.  Light housekeeping: pt can drag trash to bin and can roll the bin to the road and back Meal Prep: spouse manages  Community mobility: spouse drives; uses rollator for  longer distances  Medication management: spouse manages pills using weekly pill organizer.   Financial management: spouse manages  Handwriting: Mild micrographia and <25% legible  MOBILITY STATUS: Hx of falls  POSTURE COMMENTS:  rounded shoulders and forward head, R shoulder elevated  ACTIVITY TOLERANCE: Activity tolerance: TBD and assessed further with functional activities   FUNCTIONAL OUTCOME MEASURES: FOTO: TBD   UPPER EXTREMITY ROM:    Active ROM Right eval Left eval Right 07/12/2022 Left 07/12/2022 Right 08/28/2022 Left  08/28/2022  Shoulder flexion 118 85 120 117 124 120  Shoulder abduction 128 124 132 127 132 130  Shoulder adduction        Shoulder extension        Shoulder internal rotation [facility] WFL  Shoulder external rotation [facility] WFL  Elbow flexion        Elbow extension        Wrist flexion 55 76 59 75 59 75  Wrist extension 57 75 61 71 61 71  Wrist ulnar deviation        Wrist radial deviation        Wrist pronation [facility] WFL  Wrist supination 63 75 66 81 55 81  (Blank rows = not tested)  UPPER EXTREMITY MMT:     MMT Right eval Left eval Right 07/12/2022 Left 07/12/2022 Right 08/23/2022 Left 08/23/2022  Shoulder flexion 4 4 4+ 4+ 4+ 4+  Shoulder abduction 4+ 4+ 4+ 4+ 4+ 4+  Shoulder adduction        Shoulder extension        Shoulder internal rotation        Shoulder external rotation        Middle trapezius        Lower trapezius        Elbow flexion 4+ 4+ 4+ 4+ 5 5  Elbow extension 4+ 4+ 4+ 4+ 4+ 4+  Wrist flexion 4+ 4+ 4+ 4+ 4+ 4+  Wrist extension 4+ 4+ 4+ 4+ 4+ 4+  Wrist ulnar deviation        Wrist radial deviation        Wrist pronation        Wrist supination        (Blank rows = not tested)  HAND FUNCTION: Grip strength: Right: 16 lbs; Left: 38 lbs, Lateral pinch: Right: 10 lbs, Left: 14 lbs, and 3 point pinch: Right: 9 lbs, Left: 11 lbs 07/12/2022: Grip strength: Right: 16 lbs; Left: 40 lbs,  Lateral pinch: Right: 10 lbs, Left: 14 lbs, and 3 point pinch: Right: 9 lbs, Left: 15 lbs  08/23/2022: Grip strength: Right: 7 lbs; Left: 30 lbs, Lateral pinch: Right: 10 lbs, Left: 14 lbs, and 3 point pinch: Right: 6 lbs, Left: 14 lbs COORDINATION: 9 Hole Peg test: Right: 38 sec; Left: 40 sec 07/12/2022: 9 Hole Peg test: Right: 35  sec; Left: 36 sec 08/23/2022: 9 Hole Peg test: Right: 51 sec; Left: 39 sec SENSATION: WFL  EDEMA: None  MUSCLE TONE: R/L normal  COGNITION: Overall cognitive status: Within functional limits for tasks assessed  VISION: Subjective report: wears glasses all the time    PRAXIS: Impaired: Motor planning  OBSERVATIONS:  R SF PIP flexion contracture 105* from 5-6 years ago from a fall  TODAY'S TREATMENT:                                                                                                                              DATE: 09/08/2022:  There. Ex.:  Pt. worked on [employer], and reciprocal motion using the UBE while seated for 8 min. with no resistance. Constant monitoring was provided. Pt. performed  bilateral gross gripping with a gross grip strengthener. Pt. worked on sustaining grip while grasping pegs and reaching at various heights. The Gripper was set to  17.9# of grip strength resistance on the left, and 11.9# on the right. Pt. worked on pinch strengthening in the bilateral hand strength for lateral, and 3pt. pinch using yellow, red, green, and blue resistive clips. Pt. worked on placing the clips on a horizontal dowel. Tactile and verbal cues were required for eliciting the desired movement.  Pt. worked on BUE reaching with 2# cuff weights in place reaching for flat shapes, and progressing through the shape tower to the 3rd vertical rung.           PATIENT EDUCATION: Education details:  UE strengthening Person educated: Patient Education method: Explanation and Verbal cues, demo Education comprehension: verbalized understanding,  demonstrated understanding  HOME EXERCISE PROGRAM: Theraputty   GOALS: Goals reviewed with patient? Yes  SHORT TERM GOALS: Target date: 10/04/2022   (6 weeks)  Pt will be indep to perform HEP for improving bilat hand strength and coordination skills. Baseline: Eval: not yet initiated 07/12/2022: Completing theraputty exercises independent at home Goal status: Met/Achieved  LONG TERM GOALS: Target date: 11/15/2022 (12 weeks)  Pt will increase FOTO score to (TBD) or better to indicate improvement in self perceived functional use of the R arm with daily tasks. Baseline: Eval: TBD 07/12/2022: TBD Goal status: Discontinued  2.  Pt will increase R grip strength by 5 or more lbs to more easily hold and stabilize ADL supplies in the R dominant hand. Baseline: 08/25/2022: Grip strength: Right: 7 lbs; Left: 30 lbs 08/23/2022:  Grip strength: Right: 7 lbs; Left: 30 lbs R grip 16 lbs; limited from PD but also by 5th digit PIP flexion contracture, as this digit can not engage in gripping (L 38 lbs) 07/12/2022: Grip strength: Right: 16 lbs; limited from PD but also by 5th digit PIP flexion contracture, as this digit still can not engage in gripping (L 40 lbs) Goal status: Ongoing  3.  Pt will improve R hand FMC/dexterity skills to be able to sign his name on medical/legal documents with 75% legibility, using adapted  writing aids as needed. Baseline: 08/25/2022: Improving writing, however this is dependent on whether or not he is have a bad day with tremors 08/23/2022: Improving writing, however this is dependent on whether or not he is have a bad day with tremors. Eval: <25% legible and pt verbalizes embarrassment when attempting to sign his name on forms 07/12/2022: 75% legibility when writing his name in print, however reports he is still not satisfied with his signature looks. Goal status: Ongoing  4.  Pt will increase R FMC/GMC skills to engage the RUE into UB ADLs at least 50% of the time. Baseline:   08/25/2022: Pt. Is trying to engage his right UE more during daily ADL, and IADL tasks. Eval: Pt uses the L non-dominant arm for self care tasks. 08/23/2022: Pt. Is trying to engage his right UE more during daily ADL, and IADL tasks. Eval: Pt uses the L non-dominant arm for self care tasks. 07/12/22: Pt. Reports that he has been trying to incorporate his R hand while still using the L arm to complete all self care tasks.  Goal status: Ongoing  5. Pt. Will improve typing skills  by 1 min.  In preparation for typing an email correspondence  Baseline:  08/25/2022: Pt. Typed I sentence in 1 min. & 27 sec. With the left hand. Pt. Typed 1 sentence in 1 min. & 40 sec. With bilateral hands. Limited typing engagement with the right hand.  08/23/2022: Pt. Typed I sentence in 1 min. & 27 sec. With the left hand. Pt. Typed 1 sentence in 1 min. & 40 sec. With bilateral hands. Limited typing engagement with the right hand.    Goal status: New   ASSESSMENT:  CLINICAL IMPRESSION:  Pt. tolerated the UE strengthening, grip, and pinch strengthening well today. Pt. requires cues to reposition the gripper in his hand. Pt. requires cues, and assist for hand pinch position on the clips. Pt. requires cues for visual demonstration of proper technique with each exercise.  Pt. Presented with more control over the bilateral UEs with the cuff weights in place during reaching. Pt. continues to present with increased tremors today which limit motor control, and coordination during ADLs/IADLs, and requires cues for strategies for proximal stabilization during tasks for more distal control. Pt. continues to benefit from skilled OT for increasing bilateral hand strength, improving coordination, specifically working to increase engagement of the RUE during self care tasks.    PERFORMANCE DEFICITS: in functional skills including ADLs, IADLs, coordination, dexterity, ROM, strength, flexibility, Fine motor control, Gross motor control,  mobility, balance, body mechanics, endurance, decreased knowledge of use of DME, and UE functional use, cognitive skills including emotional and memory, and psychosocial skills including coping strategies, environmental adaptation, habits, and routines and behaviors.   IMPAIRMENTS: are limiting patient from ADLs, IADLs, and leisure.   CO-MORBIDITIES: has co-morbidities such as anxiety, depression  that affects occupational performance. Patient will benefit from skilled OT to address above impairments and improve overall function.  MODIFICATION OR ASSISTANCE TO COMPLETE EVALUATION: No modification of tasks or assist necessary to complete an evaluation.  OT OCCUPATIONAL PROFILE AND HISTORY: Problem focused assessment: Including review of records relating to presenting problem.  CLINICAL DECISION MAKING: Moderate - several treatment options, min-mod task modification necessary  REHAB POTENTIAL: Good for goals  EVALUATION COMPLEXITY: Moderate    PLAN:  OT FREQUENCY: 2x/week  OT DURATION: 12 weeks  PLANNED INTERVENTIONS: self care/ADL training, therapeutic exercise, therapeutic activity, neuromuscular re-education, manual therapy, passive range of motion, balance  training, functional mobility training, moist heat, cryotherapy, patient/family education, cognitive remediation/compensation, psychosocial skills training, energy conservation, coping strategies training, and DME and/or AE instructions RECOMMENDED OTHER SERVICES: None at this time  CONSULTED AND AGREED WITH PLAN OF CARE: Patient  PLAN FOR NEXT SESSION: handwriting  Olegario Messier, M.S. OTR/L  09/08/22, 3:18 PM

## 2022-09-13 ENCOUNTER — Ambulatory Visit: Payer: Medicare Other | Admitting: Occupational Therapy

## 2022-09-13 DIAGNOSIS — M6281 Muscle weakness (generalized): Secondary | ICD-10-CM | POA: Diagnosis not present

## 2022-09-13 DIAGNOSIS — R278 Other lack of coordination: Secondary | ICD-10-CM

## 2022-09-13 NOTE — Therapy (Signed)
Occupational Therapy Neuro Treatment Note   Patient Name: LEROI PILSON MRN: [id number] DOB:1955/07/24, 67 y.o., male Today]'s Date: 09/13/2022  PCP: Dr. Barbette Reichmann REFERRING PROVIDER: Dr. Cristopher Peru  END OF SESSION:  OT End of Session - 09/13/22 1503     Visit Number 24    Number of Visits 48    Date for OT Re-Evaluation 11/15/22    OT Start Time 1145    OT Stop Time 1230    OT Time Calculation (min) 45 min    Activity Tolerance Patient tolerated treatment well    Behavior During Therapy [facility] for tasks assessed/performed             Past Medical History:  Diagnosis Date   Abnormal liver function    Anemia    Anxiety    Atrial fibrillation (HCC)    Cancer (HCC) 2017   skin   Colonic mass    Depression    Dizziness    Dry cough    History of colon polyps    Hypercholesteremia    Hyperlipemia    Hypertension    Palpitations    Parkinson's disease    Parkinson's disease    PVD (peripheral vascular disease) (HCC)    Tonsillitis 03/25/2013   Tremors of nervous system    Past Surgical History:  Procedure Laterality Date   BACK SURGERY     Kyphoplasty   March 2018   COLONOSCOPY     COLONOSCOPY WITH PROPOFOL N/A 12/12/2017   Procedure: COLONOSCOPY WITH PROPOFOL;  Surgeon: Wyline Mood, MD;  Location: [facility] ENDOSCOPY;  Service: Gastroenterology;  Laterality: N/A;   ESOPHAGOGASTRODUODENOSCOPY N/A 12/11/2017   Procedure: ESOPHAGOGASTRODUODENOSCOPY (EGD);  Surgeon: Wyline Mood, MD;  Location: [facility] ENDOSCOPY;  Service: Gastroenterology;  Laterality: N/A;   ESOPHAGOGASTRODUODENOSCOPY N/A 10/08/2021   Procedure: ESOPHAGOGASTRODUODENOSCOPY (EGD);  Surgeon: Regis Bill, MD;  Location: [facility] ENDOSCOPY;  Service: Endoscopy;  Laterality: N/A;   ESOPHAGOGASTRODUODENOSCOPY (EGD) WITH PROPOFOL N/A 09/21/2016   Procedure: ESOPHAGOGASTRODUODENOSCOPY (EGD) WITH PROPOFOL;  Surgeon: Wyline Mood, MD;  Location: [facility] ENDOSCOPY;  Service: Gastroenterology;  Laterality: N/A;    ESOPHAGOGASTRODUODENOSCOPY (EGD) WITH PROPOFOL N/A 11/07/2016   Procedure: ESOPHAGOGASTRODUODENOSCOPY (EGD) WITH PROPOFOL;  Surgeon: Wyline Mood, MD;  Location: [facility] ENDOSCOPY;  Service: Gastroenterology;  Laterality: N/A;   ESOPHAGOGASTRODUODENOSCOPY (EGD) WITH PROPOFOL N/A 10/15/2019   Procedure: ESOPHAGOGASTRODUODENOSCOPY (EGD) WITH PROPOFOL;  Surgeon: Regis Bill, MD;  Location: [facility] ENDOSCOPY;  Service: Endoscopy;  Laterality: N/A;   ESOPHAGOGASTRODUODENOSCOPY (EGD) WITH PROPOFOL N/A 10/28/2019   Procedure: ESOPHAGOGASTRODUODENOSCOPY (EGD) WITH PROPOFOL;  Surgeon: Regis Bill, MD;  Location: [facility] ENDOSCOPY;  Service: Endoscopy;  Laterality: N/A;   ESOPHAGOGASTRODUODENOSCOPY (EGD) WITH PROPOFOL N/A 12/02/2019   Procedure: ESOPHAGOGASTRODUODENOSCOPY (EGD) WITH PROPOFOL;  Surgeon: Regis Bill, MD;  Location: [facility] ENDOSCOPY;  Service: Endoscopy;  Laterality: N/A;   ESOPHAGOGASTRODUODENOSCOPY (EGD) WITH PROPOFOL N/A 02/18/2020   Procedure: ESOPHAGOGASTRODUODENOSCOPY (EGD) WITH PROPOFOL;  Surgeon: Regis Bill, MD;  Location: [facility] ENDOSCOPY;  Service: Endoscopy;  Laterality: N/A;   ESOPHAGOGASTRODUODENOSCOPY (EGD) WITH PROPOFOL N/A 08/02/2021   Procedure: ESOPHAGOGASTRODUODENOSCOPY (EGD) WITH PROPOFOL;  Surgeon: Regis Bill, MD;  Location: [facility] ENDOSCOPY;  Service: Endoscopy;  Laterality: N/A;  REQUEST AM   ESOPHAGOGASTRODUODENOSCOPY (EGD) WITH PROPOFOL N/A 10/05/2021   Procedure: ESOPHAGOGASTRODUODENOSCOPY (EGD) WITH PROPOFOL;  Surgeon: Regis Bill, MD;  Location: [facility] ENDOSCOPY;  Service: Endoscopy;  Laterality: N/A;   FRACTURE SURGERY Right 05/28/2019   distal radius fracture   HERNIA REPAIR     left inguinal  hernia repair as an infant   KYPHOPLASTY N/A 02/11/2016   Procedure: KYPHOPLASTY  L2,T9;  Surgeon: Kennedy Bucker, MD;  Location: [facility] ORS;  Service: Orthopedics;  Laterality: N/A;   KYPHOPLASTY N/A 03/15/2016   Procedure: KYPHOPLASTY L3;  Surgeon:  Kennedy Bucker, MD;  Location: [facility] ORS;  Service: Orthopedics;  Laterality: N/A;   OPEN REDUCTION INTERNAL FIXATION (ORIF) DISTAL RADIAL FRACTURE Right 05/28/2019   Procedure: OPEN REDUCTION INTERNAL FIXATION (ORIF) DISTAL RADIAL FRACTURE;  Surgeon: Kennedy Bucker, MD;  Location: [facility] ORS;  Service: Orthopedics;  Laterality: Right;   skin cancer removed     TONSILLECTOMY     Patient Active Problem List   Diagnosis Date Noted   Acute respiratory failure due to COVID-19 (HCC) 05/28/2019   Alcohol dependence with uncomplicated withdrawal (HCC) 01/23/2018   Alcohol withdrawal (HCC) 01/23/2018   GIB (gastrointestinal bleeding) 12/09/2017   A-fib (HCC) 12/09/2017   Esophageal obstruction 10/10/2016   Esophageal ulceration 10/10/2016   GI bleed 09/20/2016   Low vitamin B12 level 03/18/2016   S/P kyphoplasty 03/18/2016   Basal cell carcinoma 02/08/2016   Atrial fibrillation (HCC) 01/27/2016   Colitis 01/27/2016   HLD (hyperlipidemia) 01/27/2016   Anxiety 01/27/2016   Hypokalemia 01/27/2016   Major depressive disorder, recurrent episode, moderate (HCC) 04/14/2015   Anxiety, generalized 12/06/2013   Parkinson disease 08/07/2013   Dysphagia 05/12/2013   Esophageal mass 05/12/2013   GERD (gastroesophageal reflux disease) 05/12/2013   Vomiting 05/12/2013   Open bite of lower leg 05/10/2013   Dog bite of lower leg 05/10/2013   Abnormal finding on liver function 03/25/2013   Benign neoplasm 03/25/2013   Dizziness 03/25/2013   Type 2 diabetes mellitus (HCC) 03/25/2013   BP (high blood pressure) 03/25/2013   Awareness of heartbeats 03/25/2013   Pure hypercholesterolemia 03/25/2013   Infective tonsillitis 03/25/2013   Adenomatous polyp 03/25/2013   ONSET DATE: 10 years   REFERRING DIAG: Parkinson's Disease  THERAPY DIAG:  Muscle weakness (generalized)  Other lack of coordination  Rationale for Evaluation and Treatment: Rehabilitation  SUBJECTIVE:  SUBJECTIVE STATEMENT:  Pt.  reports that he is tired today.  Pt accompanied by: self; spouse present in lobby, but did not follow for eval  PERTINENT HISTORY: Pt has been working with outpatient PT here at [facility] since end of Feb 2024.  OT ordered d/t worsening PD symptoms contributing to functional decline with ADLs.   PRECAUTIONS: Fall  WEIGHT BEARING RESTRICTIONS: No  PAIN:  Are you having pain? No pain reported today.  FALLS: Has patient fallen in last 6 months? Yes. Number of falls 1  LIVING ENVIRONMENT: Lives with: lives with their spouse Lives in: split level but pt remains on main level to avoid steps Stairs: Yes: Internal: 2 steps; can reach both Has following equipment at home: Quad cane small base, Walker - 4 wheeled, bed side commode, Grab bars, and pull down seat for shower, hand held shower hose,   PLOF: Needs assistance with ADLs; pt reports that spouse assists but is consistent to always let pt try things first before she automatically helps.    PATIENT GOALS: To be more indep  OBJECTIVE:  HAND DOMINANCE: Right (most affected side from PD)  ADLs: Overall ADLs: spouse is primary caregiver Transfers/ambulation related to ADLs: uses RW Eating: spouse cuts food; pt reports having to use L non-dominant hand to eat for the last 5-6 yrs Grooming: set up; assist to squeeze toothpaste and denture paste; uses L non-dominant hand UB Dressing: set up; assist  with clothing fasteners LB Dressing: Min A; assist with socks, dons slip on shoes with set up (has tried a sockaid but was unsuccessful) Toileting: modified indep Bathing: Set up/min A; spouse squeezes shampoo into pt's hands, spouse helps to towel dry  Tub Shower transfers: distant supv Equipment:  see above   IADLs: Shopping: pt can accompany spouse by pushing a shopping cart.  Light housekeeping: pt can drag trash to bin and can roll the bin to the road and back Meal Prep: spouse manages  Community mobility: spouse drives; uses rollator for  longer distances  Medication management: spouse manages pills using weekly pill organizer.   Financial management: spouse manages  Handwriting: Mild micrographia and <25% legible  MOBILITY STATUS: Hx of falls  POSTURE COMMENTS:  rounded shoulders and forward head, R shoulder elevated  ACTIVITY TOLERANCE: Activity tolerance: TBD and assessed further with functional activities   FUNCTIONAL OUTCOME MEASURES: FOTO: TBD   UPPER EXTREMITY ROM:    Active ROM Right eval Left eval Right 07/12/2022 Left 07/12/2022 Right 08/28/2022 Left  08/28/2022  Shoulder flexion 118 85 120 117 124 120  Shoulder abduction 128 124 132 127 132 130  Shoulder adduction        Shoulder extension        Shoulder internal rotation [facility] WFL  Shoulder external rotation [facility] WFL  Elbow flexion        Elbow extension        Wrist flexion 55 76 59 75 59 75  Wrist extension 57 75 61 71 61 71  Wrist ulnar deviation        Wrist radial deviation        Wrist pronation [facility] WFL  Wrist supination 63 75 66 81 55 81  (Blank rows = not tested)  UPPER EXTREMITY MMT:     MMT Right eval Left eval Right 07/12/2022 Left 07/12/2022 Right 08/23/2022 Left 08/23/2022  Shoulder flexion 4 4 4+ 4+ 4+ 4+  Shoulder abduction 4+ 4+ 4+ 4+ 4+ 4+  Shoulder adduction        Shoulder extension        Shoulder internal rotation        Shoulder external rotation        Middle trapezius        Lower trapezius        Elbow flexion 4+ 4+ 4+ 4+ 5 5  Elbow extension 4+ 4+ 4+ 4+ 4+ 4+  Wrist flexion 4+ 4+ 4+ 4+ 4+ 4+  Wrist extension 4+ 4+ 4+ 4+ 4+ 4+  Wrist ulnar deviation        Wrist radial deviation        Wrist pronation        Wrist supination        (Blank rows = not tested)  HAND FUNCTION: Grip strength: Right: 16 lbs; Left: 38 lbs, Lateral pinch: Right: 10 lbs, Left: 14 lbs, and 3 point pinch: Right: 9 lbs, Left: 11 lbs 07/12/2022: Grip strength: Right: 16 lbs; Left: 40 lbs,  Lateral pinch: Right: 10 lbs, Left: 14 lbs, and 3 point pinch: Right: 9 lbs, Left: 15 lbs  08/23/2022: Grip strength: Right: 7 lbs; Left: 30 lbs, Lateral pinch: Right: 10 lbs, Left: 14 lbs, and 3 point pinch: Right: 6 lbs, Left: 14 lbs COORDINATION: 9 Hole Peg test: Right: 38 sec; Left: 40 sec 07/12/2022: 9 Hole Peg test: Right: 35 sec; Left: 36 sec  08/23/2022: 9 Hole Peg test: Right: 51 sec; Left: 39 sec SENSATION: WFL  EDEMA: None  MUSCLE TONE: R/L normal  COGNITION: Overall cognitive status: Within functional limits for tasks assessed  VISION: Subjective report: wears glasses all the time    PRAXIS: Impaired: Motor planning  OBSERVATIONS:  R SF PIP flexion contracture 105* from 5-6 years ago from a fall  TODAY'S TREATMENT:                                                                                                                              DATE: 09/13/2022:  There. Ex.:  Pt. worked on [employer], and reciprocal motion using the UBE while seated for 8 min. with no resistance. Constant monitoring was provided. Pt. performed  bilateral gross gripping with a gross grip strengthener. Pt. performed 2# dowel ex. for UE strengthening secondary to weakness. Bilateral shoulder flexion, chest press, circular patterns, and elbow flexion/extension were performed. Pt. worked on sustaining grip while grasping pegs and reaching at various heights. The gripper was set to  17.9# of grip strength resistance on the left, and 11.9# on the right. Pt. worked on pinch strengthening in the bilateral hand strength for lateral, and 3pt. pinch using yellow, red, green, and blue resistive clips.           PATIENT EDUCATION: Education details:  UE strengthening Person educated: Patient Education method: Explanation and Verbal cues, demo Education comprehension: verbalized understanding, demonstrated understanding  HOME EXERCISE PROGRAM: Theraputty   GOALS: Goals reviewed with patient?  Yes  SHORT TERM GOALS: Target date: 10/04/2022   (6 weeks)  Pt will be indep to perform HEP for improving bilat hand strength and coordination skills. Baseline: Eval: not yet initiated 07/12/2022: Completing theraputty exercises independent at home Goal status: Met/Achieved  LONG TERM GOALS: Target date: 11/15/2022 (12 weeks)  Pt will increase FOTO score to (TBD) or better to indicate improvement in self perceived functional use of the R arm with daily tasks. Baseline: Eval: TBD 07/12/2022: TBD Goal status: Discontinued  2.  Pt will increase R grip strength by 5 or more lbs to more easily hold and stabilize ADL supplies in the R dominant hand. Baseline: 08/25/2022: Grip strength: Right: 7 lbs; Left: 30 lbs 08/23/2022:  Grip strength: Right: 7 lbs; Left: 30 lbs R grip 16 lbs; limited from PD but also by 5th digit PIP flexion contracture, as this digit can not engage in gripping (L 38 lbs) 07/12/2022: Grip strength: Right: 16 lbs; limited from PD but also by 5th digit PIP flexion contracture, as this digit still can not engage in gripping (L 40 lbs) Goal status: Ongoing  3.  Pt will improve R hand FMC/dexterity skills to be able to sign his name on medical/legal documents with 75% legibility, using adapted writing aids as needed. Baseline: 08/25/2022: Improving writing, however this is dependent on whether or not he is have a bad day with tremors 08/23/2022: Improving writing, however this  is dependent on whether or not he is have a bad day with tremors. Eval: <25% legible and pt verbalizes embarrassment when attempting to sign his name on forms 07/12/2022: 75% legibility when writing his name in print, however reports he is still not satisfied with his signature looks. Goal status: Ongoing  4.  Pt will increase R FMC/GMC skills to engage the RUE into UB ADLs at least 50% of the time. Baseline:  08/25/2022: Pt. Is trying to engage his right UE more during daily ADL, and IADL tasks. Eval: Pt uses the L  non-dominant arm for self care tasks. 08/23/2022: Pt. Is trying to engage his right UE more during daily ADL, and IADL tasks. Eval: Pt uses the L non-dominant arm for self care tasks. 07/12/22: Pt. Reports that he has been trying to incorporate his R hand while still using the L arm to complete all self care tasks.  Goal status: Ongoing  5. Pt. Will improve typing skills  by 1 min.  In preparation for typing an email correspondence  Baseline:  08/25/2022: Pt. Typed I sentence in 1 min. & 27 sec. With the left hand. Pt. Typed 1 sentence in 1 min. & 40 sec. With bilateral hands. Limited typing engagement with the right hand.  08/23/2022: Pt. Typed I sentence in 1 min. & 27 sec. With the left hand. Pt. Typed 1 sentence in 1 min. & 40 sec. With bilateral hands. Limited typing engagement with the right hand.    Goal status: New   ASSESSMENT:  CLINICAL IMPRESSION:  Pt. tolerated the UE strengthening, grip, and pinch strengthening well today. Pt. requires cues to reposition the gripper in his hand. Pt. requires cues, and assist for each of the hand pinch positions on the clips. Pt. requires cues for visual demonstration of proper technique with each exercise.  Pt. Requires rest breaks between each exercise. Pt. continues to present with increased tremors today which limit motor control, and coordination during ADLs/IADLs, and requires cues for strategies for proximal stabilization during tasks for more distal control. Pt. continues to benefit from skilled OT for increasing bilateral hand strength, improving coordination, specifically working to increase engagement of the RUE during self care tasks.    PERFORMANCE DEFICITS: in functional skills including ADLs, IADLs, coordination, dexterity, ROM, strength, flexibility, Fine motor control, Gross motor control, mobility, balance, body mechanics, endurance, decreased knowledge of use of DME, and UE functional use, cognitive skills including emotional and memory, and  psychosocial skills including coping strategies, environmental adaptation, habits, and routines and behaviors.   IMPAIRMENTS: are limiting patient from ADLs, IADLs, and leisure.   CO-MORBIDITIES: has co-morbidities such as anxiety, depression  that affects occupational performance. Patient will benefit from skilled OT to address above impairments and improve overall function.  MODIFICATION OR ASSISTANCE TO COMPLETE EVALUATION: No modification of tasks or assist necessary to complete an evaluation.  OT OCCUPATIONAL PROFILE AND HISTORY: Problem focused assessment: Including review of records relating to presenting problem.  CLINICAL DECISION MAKING: Moderate - several treatment options, min-mod task modification necessary  REHAB POTENTIAL: Good for goals  EVALUATION COMPLEXITY: Moderate    PLAN:  OT FREQUENCY: 2x/week  OT DURATION: 12 weeks  PLANNED INTERVENTIONS: self care/ADL training, therapeutic exercise, therapeutic activity, neuromuscular re-education, manual therapy, passive range of motion, balance training, functional mobility training, moist heat, cryotherapy, patient/family education, cognitive remediation/compensation, psychosocial skills training, energy conservation, coping strategies training, and DME and/or AE instructions RECOMMENDED OTHER SERVICES: None at this time  CONSULTED AND AGREED WITH  PLAN OF CARE: Patient  PLAN FOR NEXT SESSION: handwriting  Olegario Messier, M.S. OTR/L  09/13/22, 3:11 PM

## 2022-09-15 ENCOUNTER — Ambulatory Visit: Payer: Medicare Other | Admitting: Physical Therapy

## 2022-09-15 ENCOUNTER — Ambulatory Visit: Payer: Medicare Other | Admitting: Occupational Therapy

## 2022-09-15 DIAGNOSIS — R2681 Unsteadiness on feet: Secondary | ICD-10-CM

## 2022-09-15 DIAGNOSIS — M6281 Muscle weakness (generalized): Secondary | ICD-10-CM | POA: Diagnosis not present

## 2022-09-15 DIAGNOSIS — R2689 Other abnormalities of gait and mobility: Secondary | ICD-10-CM

## 2022-09-15 DIAGNOSIS — R269 Unspecified abnormalities of gait and mobility: Secondary | ICD-10-CM

## 2022-09-15 DIAGNOSIS — R262 Difficulty in walking, not elsewhere classified: Secondary | ICD-10-CM

## 2022-09-15 NOTE — Therapy (Signed)
Outpatient Physical Therapy Treatment    Patient Details  Name: Charles Stanton MRN: [id number] Date of Birth: 22-Jul-1955 Referring Provider (PT): Charles Stanton   Encounter Date: 09/15/2022  END OF SESSION:    PT End of Session - 09/15/22 1320     Visit Number 51    Number of Visits 62    Date for PT Re-Evaluation 10/11/22    Authorization Type Medicare A & B primary; federal employee secondary    Authorization Time Period -10/11/22    Progress Note Due on Visit 60    PT Start Time 1317    PT Stop Time 1358    PT Time Calculation (min) 41 min    Equipment Utilized During Treatment Gait belt    Activity Tolerance Patient tolerated treatment well;No increased pain    Behavior During Therapy Charles Stanton for tasks assessed/performed                             Past Medical History:  Diagnosis Date   Abnormal liver function    Anemia    Anxiety    Atrial fibrillation (HCC)    Cancer (HCC) 2017   skin   Colonic mass    Depression    Dizziness    Dry cough    History of colon polyps    Hypercholesteremia    Hyperlipemia    Hypertension    Palpitations    Parkinson's disease    Parkinson's disease    PVD (peripheral vascular disease) (HCC)    Tonsillitis 03/25/2013   Tremors of nervous system     Past Surgical History:  Procedure Laterality Date   BACK SURGERY     Kyphoplasty   March 2018   COLONOSCOPY     COLONOSCOPY WITH PROPOFOL N/A 12/12/2017   Procedure: COLONOSCOPY WITH PROPOFOL;  Surgeon: Charles Mood, MD;  Location: [facility] ENDOSCOPY;  Service: Gastroenterology;  Laterality: N/A;   ESOPHAGOGASTRODUODENOSCOPY N/A 12/11/2017   Procedure: ESOPHAGOGASTRODUODENOSCOPY (EGD);  Surgeon: Charles Mood, MD;  Location: [facility] ENDOSCOPY;  Service: Gastroenterology;  Laterality: N/A;   ESOPHAGOGASTRODUODENOSCOPY N/A 10/08/2021   Procedure: ESOPHAGOGASTRODUODENOSCOPY (EGD);  Surgeon: Charles Bill, MD;  Location: [facility] ENDOSCOPY;  Service: Endoscopy;   Laterality: N/A;   ESOPHAGOGASTRODUODENOSCOPY (EGD) WITH PROPOFOL N/A 09/21/2016   Procedure: ESOPHAGOGASTRODUODENOSCOPY (EGD) WITH PROPOFOL;  Surgeon: Charles Mood, MD;  Location: [facility] ENDOSCOPY;  Service: Gastroenterology;  Laterality: N/A;   ESOPHAGOGASTRODUODENOSCOPY (EGD) WITH PROPOFOL N/A 11/07/2016   Procedure: ESOPHAGOGASTRODUODENOSCOPY (EGD) WITH PROPOFOL;  Surgeon: Charles Mood, MD;  Location: [facility] ENDOSCOPY;  Service: Gastroenterology;  Laterality: N/A;   ESOPHAGOGASTRODUODENOSCOPY (EGD) WITH PROPOFOL N/A 10/15/2019   Procedure: ESOPHAGOGASTRODUODENOSCOPY (EGD) WITH PROPOFOL;  Surgeon: Charles Bill, MD;  Location: [facility] ENDOSCOPY;  Service: Endoscopy;  Laterality: N/A;   ESOPHAGOGASTRODUODENOSCOPY (EGD) WITH PROPOFOL N/A 10/28/2019   Procedure: ESOPHAGOGASTRODUODENOSCOPY (EGD) WITH PROPOFOL;  Surgeon: Charles Bill, MD;  Location: [facility] ENDOSCOPY;  Service: Endoscopy;  Laterality: N/A;   ESOPHAGOGASTRODUODENOSCOPY (EGD) WITH PROPOFOL N/A 12/02/2019   Procedure: ESOPHAGOGASTRODUODENOSCOPY (EGD) WITH PROPOFOL;  Surgeon: Charles Bill, MD;  Location: [facility] ENDOSCOPY;  Service: Endoscopy;  Laterality: N/A;   ESOPHAGOGASTRODUODENOSCOPY (EGD) WITH PROPOFOL N/A 02/18/2020   Procedure: ESOPHAGOGASTRODUODENOSCOPY (EGD) WITH PROPOFOL;  Surgeon: Charles Bill, MD;  Location: [facility] ENDOSCOPY;  Service: Endoscopy;  Laterality: N/A;   ESOPHAGOGASTRODUODENOSCOPY (EGD) WITH PROPOFOL N/A 08/02/2021   Procedure: ESOPHAGOGASTRODUODENOSCOPY (EGD) WITH PROPOFOL;  Surgeon: Charles Bill, MD;  Location: [facility] ENDOSCOPY;  Service: Endoscopy;  Laterality: N/A;  REQUEST AM   ESOPHAGOGASTRODUODENOSCOPY (EGD) WITH PROPOFOL N/A 10/05/2021   Procedure: ESOPHAGOGASTRODUODENOSCOPY (EGD) WITH PROPOFOL;  Surgeon: Charles Bill, MD;  Location: [facility] ENDOSCOPY;  Service: Endoscopy;  Laterality: N/A;   FRACTURE SURGERY Right 05/28/2019   distal radius fracture   HERNIA REPAIR     left inguinal  hernia repair as an infant   KYPHOPLASTY N/A 02/11/2016   Procedure: KYPHOPLASTY  L2,T9;  Surgeon: Charles Bucker, MD;  Location: [facility] ORS;  Service: Orthopedics;  Laterality: N/A;   KYPHOPLASTY N/A 03/15/2016   Procedure: KYPHOPLASTY L3;  Surgeon: Charles Bucker, MD;  Location: [facility] ORS;  Service: Orthopedics;  Laterality: N/A;   OPEN REDUCTION INTERNAL FIXATION (ORIF) DISTAL RADIAL FRACTURE Right 05/28/2019   Procedure: OPEN REDUCTION INTERNAL FIXATION (ORIF) DISTAL RADIAL FRACTURE;  Surgeon: Charles Bucker, MD;  Location: [facility] ORS;  Service: Orthopedics;  Laterality: Right;   skin cancer removed     TONSILLECTOMY       Subjective Assessment -      Subjective  Pt reports doing well today. He states his dog is doing better which is relieving for him and his family.       Pertinent History Charles Stanton is a 66yoM who was prescribed with Parkinson's Disease ~ 10 years.  Pt is followed by neurology, reports good reponse to medications which help control his tremors somewhat. Pt uses a SPC for AMB, also uses a walker for longer distance walking. Pt goes into community almost daily (they like to eat out) which includes lots of SPC [place] eand navigation of 4 stairs at home.    Currently in Pain? No/denies             TREATMENT:  Unless otherwise stated, CGA was provided and gait belt donned in order to ensure pt safety   TE:   Octane fitness level 3 x 6 min for aerobic priming   LAQ 2 x 10 with 4# AW ea LE   NMR  Seated PWR! Moves ( up x 15, rock x 10 ea , twist x 10 ea  ) with 1# wrist weights donned   Seated PWR! Step with STS FLOW x 10 reps total, 1# Wrist weights donnned   STS with PWR! Mod and then PWR! Step to each side in standing then repeat x 10 reps, 1# wrist weights donned    Step taps to 6 in step without UE support, cues for slow and controlled movements. X 10 ea LE with 4# AW   Standing PWR! Up, twist and rock with hands on back of armchair modification x 10 to ea side    Pt required occasional rest breaks due fatigue, PT was quick to ask when pt appeared to be fatiguing in order to prevent excessive fatigue.  PWR! Up works on postural strengthening and antigravity extension, PRW! Rock working on [employer] shifting, PRW! Twist targeting trunk rotation and PRW! Step targeting transition movements. PWR! Moves target bradykinesia, rigidity, and dyskinesia through targeted functional movements that address four core movement difficulties for people with Parkinson's disease.     PT Education -     Education Details Pt educated throughout session about proper posture and technique with exercises. Improved exercise technique, movement at target joints, use of target muscles after min to mod verbal, visual, tactile cues.      Person(s) Educated Patient    Methods Explanation;Demonstration ; handout   Comprehension Verbalized understanding;Returned demonstration;Need further instruction  PT Short Term Goals -       PT SHORT TERM GOAL #1   Title Pt to report regular performance of exercises prescribed for home and a sense of improvements in strength and balance AEB them not being as challenging anymore.    Baseline Will issue on visit 2    Time 4    Period Weeks    Status met   Target Date 03/15/22      PT SHORT TERM GOAL #2   Title Pt to demonstrate improved power AEB improved 5xSTS hands free in <18sec.    Baseline 03/01/22: 22sec hands free, author provideing foot block bilat 03/31/22: 14.82sec 4/23:17.3 sec   Time 4    Period Weeks    Status Met    Target Date 03/29/22               PT Long Term Goals - Target goal date for all remaining long term goals is 07/19/2022        PT LONG TERM GOAL #1   Title Pt to improve score on FOTO survey to 57 to indicate reduced difficulty with basic mobility required for ADL performance.    Baseline 03/01/22:49 04/29/22:56%. 06/16/22: 60  09/08/22:60   Time 8    Period Weeks    Status  Met          PT LONG TERM GOAL #2   Title Pt to improve tolerance to overground AMB c SPC AEB performance >827ft.    Baseline 2/27: fatigued after 441ft lap performed in four minutes ten seconds. 03/31/22: 722ft with SPC no rest break 04/26/22: 794 ft 06/16/22: 903 ft with SPC, no rest.   Time 8    Period Weeks    Status Met         PT LONG TERM GOAL #3   Title Pt to demonstrate improved leg power AEB 5xSTS from chair <14sec hands free and without need for minGuard assist for safety and without need for foot block.    Baseline 03/01/22: 22sec hands free, feet blocked, minGuard assist  03/31/22: 14.82sec  4/23:17.3 sec 06/16/22: 13.87 sec 7/16: 11.55 sec   Time 8     Period Weeks    Status MET         PT LONG TERM GOAL #4   Title Pt to demonstrate improvement in balance performance AEB >6 improvement BERG Balance for balance assessment.    Baseline 41 on 03/03/22. 3/38/24: 42 4/23: 45 06/16/22: 48   Time 8    Period Weeks    Status Met          5.  Pt will improve distance with LRAD to 900 feet or greater in order to indicate further progress to community ambulation distances Baseline: 794 ft with SPC , 06/16/22: 903 ft with SPC. 07/19/22: 905 feet Goal status: Met  6.  Patient will improve modified DGI (with use of cane) score > 4 points or greater in order to indicate improved balance and decreased risk of falls.  Baseline: 6/18: 11 7/16: 14  Goal status: IN PROGRESS  7. Patient will be able to withstand various light perturbations while preforming ambulation with LRAD utilizing safe balance strategies in order to simulate safe and confident walking in crowded places. Baseline: Pt currently does not feel safe preforming task, could walk through cafeteria to get baseline. 7/23: 70% confidence with activity, mild LOB with perturbations.  9/5: able to complete this task on multiple occasions without LOB.  Goal Status:MET   8. Pt will be compliant and independent with  Parkinson's based HEP by reciting and demonstrating full PWR! Move sets in seated and or standing with no cueing from therapist. Baseline: Pt currently needs demonstration or verbal cueing for proper completion  7/23: cues required for proper completion with PWR! Up, rock, and twist in seated position  Goal Status: IN PROGRESS     Plan     Clinical Impression Statement Patient arrived with good motivation form completion of pt activities.  Pt reports good compliance with HEP. Pt continues with PD specific activities to enhance his movement patterns, balance, and mobility. Pt will continue to benefit from skilled physical therapy intervention to address impairments, improve QOL, and attain therapy goals.        Personal Factors and Comorbidities Time since onset of injury/illness/exacerbation;Past/Current Experience    Examination-Activity Limitations Locomotion Level;Transfers;Bed Mobility;Reach Overhead;Hygiene/Grooming;Dressing;Toileting;Stairs;Bend    Stability/Clinical Decision Making Evolving/Moderate complexity    Clinical Decision Making Moderate    Rehab Potential Good    PT Frequency 2x / week    PT Duration 8 weeks    PT Treatment/Interventions ADLs/Self Care Home Management;Moist Heat;Gait training;Stair training;Functional mobility training;Therapeutic activities;Therapeutic exercise;Balance training;Neuromuscular re-education;Patient/family education    PT Next Visit Plan Continue plan    PT Home Exercise Plan 03/01/22: discussed finding and elevated sit surface for future STS exercises (something ~18-19 inches high)    Consulted and Agree with Plan of Care Patient             Patient will benefit from skilled therapeutic intervention in order to improve the following deficits and impairments:  balance, strength, coordination, gait, transfers. Posture. ROM   Visit Diagnosis: Muscle weakness (generalized)  Abnormality of gait and mobility  Difficulty in walking, not  elsewhere classified  Other abnormalities of gait and mobility  Unsteadiness on feet    Problem List Patient Active Problem List   Diagnosis Date Noted   Acute respiratory failure due to COVID-19 (HCC) 05/28/2019   Alcohol dependence with uncomplicated withdrawal (HCC) 01/23/2018   Alcohol withdrawal (HCC) 01/23/2018   GIB (gastrointestinal bleeding) 12/09/2017   A-fib (HCC) 12/09/2017   Esophageal obstruction 10/10/2016   Esophageal ulceration 10/10/2016   GI bleed 09/20/2016   Low vitamin B12 level 03/18/2016   S/P kyphoplasty 03/18/2016   Basal cell carcinoma 02/08/2016   Atrial fibrillation (HCC) 01/27/2016   Colitis 01/27/2016   HLD (hyperlipidemia) 01/27/2016   Anxiety 01/27/2016   Hypokalemia 01/27/2016   Major depressive disorder, recurrent episode, moderate (HCC) 04/14/2015   Anxiety, generalized 12/06/2013   Parkinson disease 08/07/2013   Dysphagia 05/12/2013   Esophageal mass 05/12/2013   GERD (gastroesophageal reflux disease) 05/12/2013   Vomiting 05/12/2013   Open bite of lower leg 05/10/2013   Dog bite of lower leg 05/10/2013   Abnormal finding on liver function 03/25/2013   Benign neoplasm 03/25/2013   Dizziness 03/25/2013   Type 2 diabetes mellitus (HCC) 03/25/2013   BP (high blood pressure) 03/25/2013   Awareness of heartbeats 03/25/2013   Pure hypercholesterolemia 03/25/2013   Infective tonsillitis 03/25/2013   Adenomatous polyp 03/25/2013      Norman Herrlich PT ,DPT Physical Therapist- [place]  [facility]

## 2022-09-15 NOTE — Therapy (Signed)
Occupational Therapy Neuro Treatment Note   Patient Name: Charles Stanton MRN: [id number] DOB:December 05, 1955, 67 y.o., male Today]'s Date: 09/15/2022  PCP: Dr. Barbette Reichmann REFERRING PROVIDER: Dr. Cristopher Peru  END OF SESSION:  OT End of Session - 09/15/22 1754     Visit Number 25    Number of Visits 48    Date for OT Re-Evaluation 11/15/22    OT Start Time 1400    OT Stop Time 1445    OT Time Calculation (min) 45 min    Equipment Utilized During Treatment Cane    Activity Tolerance Patient tolerated treatment well    Behavior During Therapy [facility] for tasks assessed/performed             Past Medical History:  Diagnosis Date   Abnormal liver function    Anemia    Anxiety    Atrial fibrillation (HCC)    Cancer (HCC) 2017   skin   Colonic mass    Depression    Dizziness    Dry cough    History of colon polyps    Hypercholesteremia    Hyperlipemia    Hypertension    Palpitations    Parkinson's disease    Parkinson's disease    PVD (peripheral vascular disease) (HCC)    Tonsillitis 03/25/2013   Tremors of nervous system    Past Surgical History:  Procedure Laterality Date   BACK SURGERY     Kyphoplasty   March 2018   COLONOSCOPY     COLONOSCOPY WITH PROPOFOL N/A 12/12/2017   Procedure: COLONOSCOPY WITH PROPOFOL;  Surgeon: Wyline Mood, MD;  Location: [facility] ENDOSCOPY;  Service: Gastroenterology;  Laterality: N/A;   ESOPHAGOGASTRODUODENOSCOPY N/A 12/11/2017   Procedure: ESOPHAGOGASTRODUODENOSCOPY (EGD);  Surgeon: Wyline Mood, MD;  Location: [facility] ENDOSCOPY;  Service: Gastroenterology;  Laterality: N/A;   ESOPHAGOGASTRODUODENOSCOPY N/A 10/08/2021   Procedure: ESOPHAGOGASTRODUODENOSCOPY (EGD);  Surgeon: Regis Bill, MD;  Location: [facility] ENDOSCOPY;  Service: Endoscopy;  Laterality: N/A;   ESOPHAGOGASTRODUODENOSCOPY (EGD) WITH PROPOFOL N/A 09/21/2016   Procedure: ESOPHAGOGASTRODUODENOSCOPY (EGD) WITH PROPOFOL;  Surgeon: Wyline Mood, MD;  Location: [facility] ENDOSCOPY;   Service: Gastroenterology;  Laterality: N/A;   ESOPHAGOGASTRODUODENOSCOPY (EGD) WITH PROPOFOL N/A 11/07/2016   Procedure: ESOPHAGOGASTRODUODENOSCOPY (EGD) WITH PROPOFOL;  Surgeon: Wyline Mood, MD;  Location: [facility] ENDOSCOPY;  Service: Gastroenterology;  Laterality: N/A;   ESOPHAGOGASTRODUODENOSCOPY (EGD) WITH PROPOFOL N/A 10/15/2019   Procedure: ESOPHAGOGASTRODUODENOSCOPY (EGD) WITH PROPOFOL;  Surgeon: Regis Bill, MD;  Location: [facility] ENDOSCOPY;  Service: Endoscopy;  Laterality: N/A;   ESOPHAGOGASTRODUODENOSCOPY (EGD) WITH PROPOFOL N/A 10/28/2019   Procedure: ESOPHAGOGASTRODUODENOSCOPY (EGD) WITH PROPOFOL;  Surgeon: Regis Bill, MD;  Location: [facility] ENDOSCOPY;  Service: Endoscopy;  Laterality: N/A;   ESOPHAGOGASTRODUODENOSCOPY (EGD) WITH PROPOFOL N/A 12/02/2019   Procedure: ESOPHAGOGASTRODUODENOSCOPY (EGD) WITH PROPOFOL;  Surgeon: Regis Bill, MD;  Location: [facility] ENDOSCOPY;  Service: Endoscopy;  Laterality: N/A;   ESOPHAGOGASTRODUODENOSCOPY (EGD) WITH PROPOFOL N/A 02/18/2020   Procedure: ESOPHAGOGASTRODUODENOSCOPY (EGD) WITH PROPOFOL;  Surgeon: Regis Bill, MD;  Location: [facility] ENDOSCOPY;  Service: Endoscopy;  Laterality: N/A;   ESOPHAGOGASTRODUODENOSCOPY (EGD) WITH PROPOFOL N/A 08/02/2021   Procedure: ESOPHAGOGASTRODUODENOSCOPY (EGD) WITH PROPOFOL;  Surgeon: Regis Bill, MD;  Location: [facility] ENDOSCOPY;  Service: Endoscopy;  Laterality: N/A;  REQUEST AM   ESOPHAGOGASTRODUODENOSCOPY (EGD) WITH PROPOFOL N/A 10/05/2021   Procedure: ESOPHAGOGASTRODUODENOSCOPY (EGD) WITH PROPOFOL;  Surgeon: Regis Bill, MD;  Location: [facility] ENDOSCOPY;  Service: Endoscopy;  Laterality: N/A;   FRACTURE SURGERY Right 05/28/2019   distal radius fracture  HERNIA REPAIR     left inguinal hernia repair as an infant   KYPHOPLASTY N/A 02/11/2016   Procedure: KYPHOPLASTY  L2,T9;  Surgeon: Kennedy Bucker, MD;  Location: [facility] ORS;  Service: Orthopedics;  Laterality: N/A;   KYPHOPLASTY N/A  03/15/2016   Procedure: KYPHOPLASTY L3;  Surgeon: Kennedy Bucker, MD;  Location: [facility] ORS;  Service: Orthopedics;  Laterality: N/A;   OPEN REDUCTION INTERNAL FIXATION (ORIF) DISTAL RADIAL FRACTURE Right 05/28/2019   Procedure: OPEN REDUCTION INTERNAL FIXATION (ORIF) DISTAL RADIAL FRACTURE;  Surgeon: Kennedy Bucker, MD;  Location: [facility] ORS;  Service: Orthopedics;  Laterality: Right;   skin cancer removed     TONSILLECTOMY     Patient Active Problem List   Diagnosis Date Noted   Acute respiratory failure due to COVID-19 (HCC) 05/28/2019   Alcohol dependence with uncomplicated withdrawal (HCC) 01/23/2018   Alcohol withdrawal (HCC) 01/23/2018   GIB (gastrointestinal bleeding) 12/09/2017   A-fib (HCC) 12/09/2017   Esophageal obstruction 10/10/2016   Esophageal ulceration 10/10/2016   GI bleed 09/20/2016   Low vitamin B12 level 03/18/2016   S/P kyphoplasty 03/18/2016   Basal cell carcinoma 02/08/2016   Atrial fibrillation (HCC) 01/27/2016   Colitis 01/27/2016   HLD (hyperlipidemia) 01/27/2016   Anxiety 01/27/2016   Hypokalemia 01/27/2016   Major depressive disorder, recurrent episode, moderate (HCC) 04/14/2015   Anxiety, generalized 12/06/2013   Parkinson disease 08/07/2013   Dysphagia 05/12/2013   Esophageal mass 05/12/2013   GERD (gastroesophageal reflux disease) 05/12/2013   Vomiting 05/12/2013   Open bite of lower leg 05/10/2013   Dog bite of lower leg 05/10/2013   Abnormal finding on liver function 03/25/2013   Benign neoplasm 03/25/2013   Dizziness 03/25/2013   Type 2 diabetes mellitus (HCC) 03/25/2013   BP (high blood pressure) 03/25/2013   Awareness of heartbeats 03/25/2013   Pure hypercholesterolemia 03/25/2013   Infective tonsillitis 03/25/2013   Adenomatous polyp 03/25/2013   ONSET DATE: 10 years   REFERRING DIAG: Parkinson's Disease  THERAPY DIAG:  Muscle weakness (generalized)  Rationale for Evaluation and Treatment: Rehabilitation  SUBJECTIVE:  SUBJECTIVE  STATEMENT:  Pt. reports that he is tired today.  Pt accompanied by: self; spouse present in lobby, but did not follow for eval  PERTINENT HISTORY: Pt has been working with outpatient PT here at [facility] since end of Feb 2024.  OT ordered d/t worsening PD symptoms contributing to functional decline with ADLs.   PRECAUTIONS: Fall  WEIGHT BEARING RESTRICTIONS: No  PAIN:  Are you having pain? No pain reported today.  FALLS: Has patient fallen in last 6 months? Yes. Number of falls 1  LIVING ENVIRONMENT: Lives with: lives with their spouse Lives in: split level but pt remains on main level to avoid steps Stairs: Yes: Internal: 2 steps; can reach both Has following equipment at home: Quad cane small base, Walker - 4 wheeled, bed side commode, Grab bars, and pull down seat for shower, hand held shower hose,   PLOF: Needs assistance with ADLs; pt reports that spouse assists but is consistent to always let pt try things first before she automatically helps.    PATIENT GOALS: To be more indep  OBJECTIVE:  HAND DOMINANCE: Right (most affected side from PD)  ADLs: Overall ADLs: spouse is primary caregiver Transfers/ambulation related to ADLs: uses RW Eating: spouse cuts food; pt reports having to use L non-dominant hand to eat for the last 5-6 yrs Grooming: set up; assist to squeeze toothpaste and denture paste; uses L non-dominant hand UB Dressing:  set up; assist with clothing fasteners LB Dressing: Min A; assist with socks, dons slip on shoes with set up (has tried a sockaid but was unsuccessful) Toileting: modified indep Bathing: Set up/min A; spouse squeezes shampoo into pt's hands, spouse helps to towel dry  Tub Shower transfers: distant supv Equipment:  see above   IADLs: Shopping: pt can accompany spouse by pushing a shopping cart.  Light housekeeping: pt can drag trash to bin and can roll the bin to the road and back Meal Prep: spouse manages  Community mobility: spouse drives;  uses rollator for longer distances  Medication management: spouse manages pills using weekly pill organizer.   Financial management: spouse manages  Handwriting: Mild micrographia and <25% legible  MOBILITY STATUS: Hx of falls  POSTURE COMMENTS:  rounded shoulders and forward head, R shoulder elevated  ACTIVITY TOLERANCE: Activity tolerance: TBD and assessed further with functional activities   FUNCTIONAL OUTCOME MEASURES: FOTO: TBD   UPPER EXTREMITY ROM:    Active ROM Right eval Left eval Right 07/12/2022 Left 07/12/2022 Right 08/28/2022 Left  08/28/2022  Shoulder flexion 118 85 120 117 124 120  Shoulder abduction 128 124 132 127 132 130  Shoulder adduction        Shoulder extension        Shoulder internal rotation [facility] WFL  Shoulder external rotation [facility] WFL  Elbow flexion        Elbow extension        Wrist flexion 55 76 59 75 59 75  Wrist extension 57 75 61 71 61 71  Wrist ulnar deviation        Wrist radial deviation        Wrist pronation [facility] WFL  Wrist supination 63 75 66 81 55 81  (Blank rows = not tested)  UPPER EXTREMITY MMT:     MMT Right eval Left eval Right 07/12/2022 Left 07/12/2022 Right 08/23/2022 Left 08/23/2022  Shoulder flexion 4 4 4+ 4+ 4+ 4+  Shoulder abduction 4+ 4+ 4+ 4+ 4+ 4+  Shoulder adduction        Shoulder extension        Shoulder internal rotation        Shoulder external rotation        Middle trapezius        Lower trapezius        Elbow flexion 4+ 4+ 4+ 4+ 5 5  Elbow extension 4+ 4+ 4+ 4+ 4+ 4+  Wrist flexion 4+ 4+ 4+ 4+ 4+ 4+  Wrist extension 4+ 4+ 4+ 4+ 4+ 4+  Wrist ulnar deviation        Wrist radial deviation        Wrist pronation        Wrist supination        (Blank rows = not tested)  HAND FUNCTION: Grip strength: Right: 16 lbs; Left: 38 lbs, Lateral pinch: Right: 10 lbs, Left: 14 lbs, and 3 point pinch: Right: 9 lbs, Left: 11 lbs 07/12/2022: Grip strength: Right: 16 lbs;  Left: 40 lbs, Lateral pinch: Right: 10 lbs, Left: 14 lbs, and 3 point pinch: Right: 9 lbs, Left: 15 lbs  08/23/2022: Grip strength: Right: 7 lbs; Left: 30 lbs, Lateral pinch: Right: 10 lbs, Left: 14 lbs, and 3 point pinch: Right: 6 lbs, Left: 14 lbs COORDINATION: 9 Hole Peg test: Right: 38 sec; Left: 40 sec 07/12/2022: 9 Hole Peg test: Right: 35 sec;  Left: 36 sec 08/23/2022: 9 Hole Peg test: Right: 51 sec; Left: 39 sec SENSATION: WFL  EDEMA: None  MUSCLE TONE: R/L normal  COGNITION: Overall cognitive status: Within functional limits for tasks assessed  VISION: Subjective report: wears glasses all the time    PRAXIS: Impaired: Motor planning  OBSERVATIONS:  R SF PIP flexion contracture 105* from 5-6 years ago from a fall  TODAY'S TREATMENT:                                                                                                                              DATE: 09/13/2022:  There. Ex.:  Pt. worked on [employer], and reciprocal motion using the UBE while seated for 8 min. with no resistance. Constant monitoring was provided. Pt. performed  bilateral gross gripping with a gross grip strengthener. Pt. worked on sustaining grip while grasping pegs and reaching at various heights. The gripper was set to  17.9# of grip strength resistance on the left, and 11.9# on the right. Pt. worked on pinch strengthening in the bilateral hand strength for lateral, and 3pt. pinch using yellow, red, green, and blue resistive clips.           PATIENT EDUCATION: Education details:  UE strengthening Person educated: Patient Education method: Explanation and Verbal cues, demo Education comprehension: verbalized understanding, demonstrated understanding  HOME EXERCISE PROGRAM: Theraputty   GOALS: Goals reviewed with patient? Yes  SHORT TERM GOALS: Target date: 10/04/2022   (6 weeks)  Pt will be indep to perform HEP for improving bilat hand strength and coordination  skills. Baseline: Eval: not yet initiated 07/12/2022: Completing theraputty exercises independent at home Goal status: Met/Achieved  LONG TERM GOALS: Target date: 11/15/2022 (12 weeks)  Pt will increase FOTO score to (TBD) or better to indicate improvement in self perceived functional use of the R arm with daily tasks. Baseline: Eval: TBD 07/12/2022: TBD Goal status: Discontinued  2.  Pt will increase R grip strength by 5 or more lbs to more easily hold and stabilize ADL supplies in the R dominant hand. Baseline: 08/25/2022: Grip strength: Right: 7 lbs; Left: 30 lbs 08/23/2022:  Grip strength: Right: 7 lbs; Left: 30 lbs R grip 16 lbs; limited from PD but also by 5th digit PIP flexion contracture, as this digit can not engage in gripping (L 38 lbs) 07/12/2022: Grip strength: Right: 16 lbs; limited from PD but also by 5th digit PIP flexion contracture, as this digit still can not engage in gripping (L 40 lbs) Goal status: Ongoing  3.  Pt will improve R hand FMC/dexterity skills to be able to sign his name on medical/legal documents with 75% legibility, using adapted writing aids as needed. Baseline: 08/25/2022: Improving writing, however this is dependent on whether or not he is have a bad day with tremors 08/23/2022: Improving writing, however this is dependent on whether or not he is have a bad day with tremors. Eval: <25% legible and pt verbalizes  embarrassment when attempting to sign his name on forms 07/12/2022: 75% legibility when writing his name in print, however reports he is still not satisfied with his signature looks. Goal status: Ongoing  4.  Pt will increase R FMC/GMC skills to engage the RUE into UB ADLs at least 50% of the time. Baseline:  08/25/2022: Pt. Is trying to engage his right UE more during daily ADL, and IADL tasks. Eval: Pt uses the L non-dominant arm for self care tasks. 08/23/2022: Pt. Is trying to engage his right UE more during daily ADL, and IADL tasks. Eval: Pt uses the L  non-dominant arm for self care tasks. 07/12/22: Pt. Reports that he has been trying to incorporate his R hand while still using the L arm to complete all self care tasks.  Goal status: Ongoing  5. Pt. Will improve typing skills  by 1 min.  In preparation for typing an email correspondence  Baseline:  08/25/2022: Pt. Typed I sentence in 1 min. & 27 sec. With the left hand. Pt. Typed 1 sentence in 1 min. & 40 sec. With bilateral hands. Limited typing engagement with the right hand.  08/23/2022: Pt. Typed I sentence in 1 min. & 27 sec. With the left hand. Pt. Typed 1 sentence in 1 min. & 40 sec. With bilateral hands. Limited typing engagement with the right hand.    Goal status: New   ASSESSMENT:  CLINICAL IMPRESSION:  Pt. presented with less tremors this afternoon. Pt. continues tolerate the UE strengthening, grip, and pinch strengthening well today. Pt. Continues to require cues to reposition the gripper in his hand. Pt. requires cues, and assist for each of the hand pinch positions on the clips. Pt. requires cues for visual demonstration of proper technique with each exercise.  Pt. continues to require rest breaks between each exercise. Pt.  Presented with less tremors today with more motor control, and coordination during ADLs/IADLs, and Pt. Continues to benefit from strategies for proximal stabilization during tasks for more distal control. Pt. continues to benefit from skilled OT for increasing bilateral hand strength, improving coordination, specifically working to increase engagement of the RUE during self care tasks.    PERFORMANCE DEFICITS: in functional skills including ADLs, IADLs, coordination, dexterity, ROM, strength, flexibility, Fine motor control, Gross motor control, mobility, balance, body mechanics, endurance, decreased knowledge of use of DME, and UE functional use, cognitive skills including emotional and memory, and psychosocial skills including coping strategies, environmental  adaptation, habits, and routines and behaviors.   IMPAIRMENTS: are limiting patient from ADLs, IADLs, and leisure.   CO-MORBIDITIES: has co-morbidities such as anxiety, depression  that affects occupational performance. Patient will benefit from skilled OT to address above impairments and improve overall function.  MODIFICATION OR ASSISTANCE TO COMPLETE EVALUATION: No modification of tasks or assist necessary to complete an evaluation.  OT OCCUPATIONAL PROFILE AND HISTORY: Problem focused assessment: Including review of records relating to presenting problem.  CLINICAL DECISION MAKING: Moderate - several treatment options, min-mod task modification necessary  REHAB POTENTIAL: Good for goals  EVALUATION COMPLEXITY: Moderate    PLAN:  OT FREQUENCY: 2x/week  OT DURATION: 12 weeks  PLANNED INTERVENTIONS: self care/ADL training, therapeutic exercise, therapeutic activity, neuromuscular re-education, manual therapy, passive range of motion, balance training, functional mobility training, moist heat, cryotherapy, patient/family education, cognitive remediation/compensation, psychosocial skills training, energy conservation, coping strategies training, and DME and/or AE instructions RECOMMENDED OTHER SERVICES: None at this time  CONSULTED AND AGREED WITH PLAN OF CARE: Patient  PLAN FOR  NEXT SESSION: handwriting  Olegario Messier, M.S. OTR/L  09/15/22, 5:56 PM

## 2022-09-20 ENCOUNTER — Ambulatory Visit: Payer: Medicare Other | Admitting: Occupational Therapy

## 2022-09-20 ENCOUNTER — Ambulatory Visit: Payer: Medicare Other | Admitting: Physical Therapy

## 2022-09-20 DIAGNOSIS — M6281 Muscle weakness (generalized): Secondary | ICD-10-CM

## 2022-09-20 DIAGNOSIS — R262 Difficulty in walking, not elsewhere classified: Secondary | ICD-10-CM

## 2022-09-20 DIAGNOSIS — R269 Unspecified abnormalities of gait and mobility: Secondary | ICD-10-CM

## 2022-09-20 DIAGNOSIS — R2681 Unsteadiness on feet: Secondary | ICD-10-CM

## 2022-09-20 DIAGNOSIS — R2689 Other abnormalities of gait and mobility: Secondary | ICD-10-CM

## 2022-09-20 NOTE — Therapy (Signed)
Occupational Therapy Neuro Treatment Note   Patient Name: Charles Stanton MRN: [id number] DOB:1955/04/21, 67 y.o., male Today]'s Date: 09/20/2022  PCP: Dr. Barbette Reichmann REFERRING PROVIDER: Dr. Cristopher Peru  END OF SESSION:  OT End of Session - 09/20/22 1642     Visit Number 26    Date for OT Re-Evaluation 11/15/22    OT Start Time 1400    OT Stop Time 1445    OT Time Calculation (min) 45 min    Activity Tolerance Patient tolerated treatment well    Behavior During Therapy [facility] for tasks assessed/performed             Past Medical History:  Diagnosis Date   Abnormal liver function    Anemia    Anxiety    Atrial fibrillation (HCC)    Cancer (HCC) 2017   skin   Colonic mass    Depression    Dizziness    Dry cough    History of colon polyps    Hypercholesteremia    Hyperlipemia    Hypertension    Palpitations    Parkinson's disease    Parkinson's disease    PVD (peripheral vascular disease) (HCC)    Tonsillitis 03/25/2013   Tremors of nervous system    Past Surgical History:  Procedure Laterality Date   BACK SURGERY     Kyphoplasty   March 2018   COLONOSCOPY     COLONOSCOPY WITH PROPOFOL N/A 12/12/2017   Procedure: COLONOSCOPY WITH PROPOFOL;  Surgeon: Wyline Mood, MD;  Location: [facility] ENDOSCOPY;  Service: Gastroenterology;  Laterality: N/A;   ESOPHAGOGASTRODUODENOSCOPY N/A 12/11/2017   Procedure: ESOPHAGOGASTRODUODENOSCOPY (EGD);  Surgeon: Wyline Mood, MD;  Location: [facility] ENDOSCOPY;  Service: Gastroenterology;  Laterality: N/A;   ESOPHAGOGASTRODUODENOSCOPY N/A 10/08/2021   Procedure: ESOPHAGOGASTRODUODENOSCOPY (EGD);  Surgeon: Regis Bill, MD;  Location: [facility] ENDOSCOPY;  Service: Endoscopy;  Laterality: N/A;   ESOPHAGOGASTRODUODENOSCOPY (EGD) WITH PROPOFOL N/A 09/21/2016   Procedure: ESOPHAGOGASTRODUODENOSCOPY (EGD) WITH PROPOFOL;  Surgeon: Wyline Mood, MD;  Location: [facility] ENDOSCOPY;  Service: Gastroenterology;  Laterality: N/A;    ESOPHAGOGASTRODUODENOSCOPY (EGD) WITH PROPOFOL N/A 11/07/2016   Procedure: ESOPHAGOGASTRODUODENOSCOPY (EGD) WITH PROPOFOL;  Surgeon: Wyline Mood, MD;  Location: [facility] ENDOSCOPY;  Service: Gastroenterology;  Laterality: N/A;   ESOPHAGOGASTRODUODENOSCOPY (EGD) WITH PROPOFOL N/A 10/15/2019   Procedure: ESOPHAGOGASTRODUODENOSCOPY (EGD) WITH PROPOFOL;  Surgeon: Regis Bill, MD;  Location: [facility] ENDOSCOPY;  Service: Endoscopy;  Laterality: N/A;   ESOPHAGOGASTRODUODENOSCOPY (EGD) WITH PROPOFOL N/A 10/28/2019   Procedure: ESOPHAGOGASTRODUODENOSCOPY (EGD) WITH PROPOFOL;  Surgeon: Regis Bill, MD;  Location: [facility] ENDOSCOPY;  Service: Endoscopy;  Laterality: N/A;   ESOPHAGOGASTRODUODENOSCOPY (EGD) WITH PROPOFOL N/A 12/02/2019   Procedure: ESOPHAGOGASTRODUODENOSCOPY (EGD) WITH PROPOFOL;  Surgeon: Regis Bill, MD;  Location: [facility] ENDOSCOPY;  Service: Endoscopy;  Laterality: N/A;   ESOPHAGOGASTRODUODENOSCOPY (EGD) WITH PROPOFOL N/A 02/18/2020   Procedure: ESOPHAGOGASTRODUODENOSCOPY (EGD) WITH PROPOFOL;  Surgeon: Regis Bill, MD;  Location: [facility] ENDOSCOPY;  Service: Endoscopy;  Laterality: N/A;   ESOPHAGOGASTRODUODENOSCOPY (EGD) WITH PROPOFOL N/A 08/02/2021   Procedure: ESOPHAGOGASTRODUODENOSCOPY (EGD) WITH PROPOFOL;  Surgeon: Regis Bill, MD;  Location: [facility] ENDOSCOPY;  Service: Endoscopy;  Laterality: N/A;  REQUEST AM   ESOPHAGOGASTRODUODENOSCOPY (EGD) WITH PROPOFOL N/A 10/05/2021   Procedure: ESOPHAGOGASTRODUODENOSCOPY (EGD) WITH PROPOFOL;  Surgeon: Regis Bill, MD;  Location: [facility] ENDOSCOPY;  Service: Endoscopy;  Laterality: N/A;   FRACTURE SURGERY Right 05/28/2019   distal radius fracture   HERNIA REPAIR     left inguinal hernia repair as an infant  KYPHOPLASTY N/A 02/11/2016   Procedure: KYPHOPLASTY  L2,T9;  Surgeon: Kennedy Bucker, MD;  Location: [facility] ORS;  Service: Orthopedics;  Laterality: N/A;   KYPHOPLASTY N/A 03/15/2016   Procedure: KYPHOPLASTY L3;  Surgeon:  Kennedy Bucker, MD;  Location: [facility] ORS;  Service: Orthopedics;  Laterality: N/A;   OPEN REDUCTION INTERNAL FIXATION (ORIF) DISTAL RADIAL FRACTURE Right 05/28/2019   Procedure: OPEN REDUCTION INTERNAL FIXATION (ORIF) DISTAL RADIAL FRACTURE;  Surgeon: Kennedy Bucker, MD;  Location: [facility] ORS;  Service: Orthopedics;  Laterality: Right;   skin cancer removed     TONSILLECTOMY     Patient Active Problem List   Diagnosis Date Noted   Acute respiratory failure due to COVID-19 (HCC) 05/28/2019   Alcohol dependence with uncomplicated withdrawal (HCC) 01/23/2018   Alcohol withdrawal (HCC) 01/23/2018   GIB (gastrointestinal bleeding) 12/09/2017   A-fib (HCC) 12/09/2017   Esophageal obstruction 10/10/2016   Esophageal ulceration 10/10/2016   GI bleed 09/20/2016   Low vitamin B12 level 03/18/2016   S/P kyphoplasty 03/18/2016   Basal cell carcinoma 02/08/2016   Atrial fibrillation (HCC) 01/27/2016   Colitis 01/27/2016   HLD (hyperlipidemia) 01/27/2016   Anxiety 01/27/2016   Hypokalemia 01/27/2016   Major depressive disorder, recurrent episode, moderate (HCC) 04/14/2015   Anxiety, generalized 12/06/2013   Parkinson disease 08/07/2013   Dysphagia 05/12/2013   Esophageal mass 05/12/2013   GERD (gastroesophageal reflux disease) 05/12/2013   Vomiting 05/12/2013   Open bite of lower leg 05/10/2013   Dog bite of lower leg 05/10/2013   Abnormal finding on liver function 03/25/2013   Benign neoplasm 03/25/2013   Dizziness 03/25/2013   Type 2 diabetes mellitus (HCC) 03/25/2013   BP (high blood pressure) 03/25/2013   Awareness of heartbeats 03/25/2013   Pure hypercholesterolemia 03/25/2013   Infective tonsillitis 03/25/2013   Adenomatous polyp 03/25/2013   ONSET DATE: 10 years   REFERRING DIAG: Parkinson's Disease  THERAPY DIAG:  Muscle weakness (generalized)  Rationale for Evaluation and Treatment: Rehabilitation  SUBJECTIVE:  SUBJECTIVE STATEMENT:  Pt. reports that he is tired following  PT  Pt accompanied by: self; spouse present in lobby, but did not follow for eval  PERTINENT HISTORY: Pt has been working with outpatient PT here at [facility] since end of Feb 2024.  OT ordered d/t worsening PD symptoms contributing to functional decline with ADLs.   PRECAUTIONS: Fall  WEIGHT BEARING RESTRICTIONS: No  PAIN:  Are you having pain? No pain reported today.  FALLS: Has patient fallen in last 6 months? Yes. Number of falls 1  LIVING ENVIRONMENT: Lives with: lives with their spouse Lives in: split level but pt remains on main level to avoid steps Stairs: Yes: Internal: 2 steps; can reach both Has following equipment at home: Quad cane small base, Walker - 4 wheeled, bed side commode, Grab bars, and pull down seat for shower, hand held shower hose,   PLOF: Needs assistance with ADLs; pt reports that spouse assists but is consistent to always let pt try things first before she automatically helps.    PATIENT GOALS: To be more indep  OBJECTIVE:  HAND DOMINANCE: Right (most affected side from PD)  ADLs: Overall ADLs: spouse is primary caregiver Transfers/ambulation related to ADLs: uses RW Eating: spouse cuts food; pt reports having to use L non-dominant hand to eat for the last 5-6 yrs Grooming: set up; assist to squeeze toothpaste and denture paste; uses L non-dominant hand UB Dressing: set up; assist with clothing fasteners LB Dressing: Min A; assist with socks, dons  slip on shoes with set up (has tried a sockaid but was unsuccessful) Toileting: modified indep Bathing: Set up/min A; spouse squeezes shampoo into pt's hands, spouse helps to towel dry  Tub Shower transfers: distant supv Equipment:  see above   IADLs: Shopping: pt can accompany spouse by pushing a shopping cart.  Light housekeeping: pt can drag trash to bin and can roll the bin to the road and back Meal Prep: spouse manages  Community mobility: spouse drives; uses rollator for longer distances  Medication  management: spouse manages pills using weekly pill organizer.   Financial management: spouse manages  Handwriting: Mild micrographia and <25% legible  MOBILITY STATUS: Hx of falls  POSTURE COMMENTS:  rounded shoulders and forward head, R shoulder elevated  ACTIVITY TOLERANCE: Activity tolerance: TBD and assessed further with functional activities   FUNCTIONAL OUTCOME MEASURES: FOTO: TBD   UPPER EXTREMITY ROM:    Active ROM Right eval Left eval Right 07/12/2022 Left 07/12/2022 Right 08/28/2022 Left  08/28/2022  Shoulder flexion 118 85 120 117 124 120  Shoulder abduction 128 124 132 127 132 130  Shoulder adduction        Shoulder extension        Shoulder internal rotation [facility] WFL  Shoulder external rotation [facility] WFL  Elbow flexion        Elbow extension        Wrist flexion 55 76 59 75 59 75  Wrist extension 57 75 61 71 61 71  Wrist ulnar deviation        Wrist radial deviation        Wrist pronation [facility] WFL  Wrist supination 63 75 66 81 55 81  (Blank rows = not tested)  UPPER EXTREMITY MMT:     MMT Right eval Left eval Right 07/12/2022 Left 07/12/2022 Right 08/23/2022 Left 08/23/2022  Shoulder flexion 4 4 4+ 4+ 4+ 4+  Shoulder abduction 4+ 4+ 4+ 4+ 4+ 4+  Shoulder adduction        Shoulder extension        Shoulder internal rotation        Shoulder external rotation        Middle trapezius        Lower trapezius        Elbow flexion 4+ 4+ 4+ 4+ 5 5  Elbow extension 4+ 4+ 4+ 4+ 4+ 4+  Wrist flexion 4+ 4+ 4+ 4+ 4+ 4+  Wrist extension 4+ 4+ 4+ 4+ 4+ 4+  Wrist ulnar deviation        Wrist radial deviation        Wrist pronation        Wrist supination        (Blank rows = not tested)  HAND FUNCTION: Grip strength: Right: 16 lbs; Left: 38 lbs, Lateral pinch: Right: 10 lbs, Left: 14 lbs, and 3 point pinch: Right: 9 lbs, Left: 11 lbs 07/12/2022: Grip strength: Right: 16 lbs; Left: 40 lbs, Lateral pinch: Right: 10 lbs,  Left: 14 lbs, and 3 point pinch: Right: 9 lbs, Left: 15 lbs  08/23/2022: Grip strength: Right: 7 lbs; Left: 30 lbs, Lateral pinch: Right: 10 lbs, Left: 14 lbs, and 3 point pinch: Right: 6 lbs, Left: 14 lbs COORDINATION: 9 Hole Peg test: Right: 38 sec; Left: 40 sec 07/12/2022: 9 Hole Peg test: Right: 35 sec; Left: 36 sec 08/23/2022: 9 Hole Peg test: Right: 51 sec; Left: 39 sec  SENSATION: WFL  EDEMA: None  MUSCLE TONE: R/L normal  COGNITION: Overall cognitive status: Within functional limits for tasks assessed  VISION: Subjective report: wears glasses all the time    PRAXIS: Impaired: Motor planning  OBSERVATIONS:  R SF PIP flexion contracture 105* from 5-6 years ago from a fall  TODAY'S TREATMENT:                                                                                                                              DATE: 09/20/2022:  There. Ex.:  Pt. worked on [employer], and reciprocal motion using the UBE while seated for 8 min. with no resistance. Constant monitoring was provided. Pt. performed  bilateral gross gripping with a gross grip strengthener. Pt. worked on sustaining grip while grasping pegs and reaching at various heights. The gripper was set to 17.9# of grip strength resistance on the left, and 11.9# on the right. Pt. worked on bilateral hand grasping 1" resistive cubes alternating thumb opposition to the tip of the 2nd through 5th digits while the board is placed at a vertical angle. Pt. worked on pressing the cubes back into place while alternating isolated 2nd through 5th digit extension. Pt. worked on bilateral reaching for [employer], and moving them through 4 levels of horizontal rungs placed at progressively higher heights. Pt. Utilized a 2# cuff weight on the bilateral wrists.               PATIENT EDUCATION: Education details:  UE strengthening Person educated: Patient Education method: Explanation and Verbal cues, demo Education comprehension:  verbalized understanding, demonstrated understanding  HOME EXERCISE PROGRAM: Theraputty   GOALS: Goals reviewed with patient? Yes  SHORT TERM GOALS: Target date: 10/04/2022   (6 weeks)  Pt will be indep to perform HEP for improving bilat hand strength and coordination skills. Baseline: Eval: not yet initiated 07/12/2022: Completing theraputty exercises independent at home Goal status: Met/Achieved  LONG TERM GOALS: Target date: 11/15/2022 (12 weeks)  Pt will increase FOTO score to (TBD) or better to indicate improvement in self perceived functional use of the R arm with daily tasks. Baseline: Eval: TBD 07/12/2022: TBD Goal status: Discontinued  2.  Pt will increase R grip strength by 5 or more lbs to more easily hold and stabilize ADL supplies in the R dominant hand. Baseline: 08/25/2022: Grip strength: Right: 7 lbs; Left: 30 lbs 08/23/2022:  Grip strength: Right: 7 lbs; Left: 30 lbs R grip 16 lbs; limited from PD but also by 5th digit PIP flexion contracture, as this digit can not engage in gripping (L 38 lbs) 07/12/2022: Grip strength: Right: 16 lbs; limited from PD but also by 5th digit PIP flexion contracture, as this digit still can not engage in gripping (L 40 lbs) Goal status: Ongoing  3.  Pt will improve R hand FMC/dexterity skills to be able to sign his name on medical/legal documents with 75% legibility, using adapted writing aids as needed.  Baseline: 08/25/2022: Improving writing, however this is dependent on whether or not he is have a bad day with tremors 08/23/2022: Improving writing, however this is dependent on whether or not he is have a bad day with tremors. Eval: <25% legible and pt verbalizes embarrassment when attempting to sign his name on forms 07/12/2022: 75% legibility when writing his name in print, however reports he is still not satisfied with his signature looks. Goal status: Ongoing  4.  Pt will increase R FMC/GMC skills to engage the RUE into UB ADLs at least 50% of  the time. Baseline:  08/25/2022: Pt. Is trying to engage his right UE more during daily ADL, and IADL tasks. Eval: Pt uses the L non-dominant arm for self care tasks. 08/23/2022: Pt. Is trying to engage his right UE more during daily ADL, and IADL tasks. Eval: Pt uses the L non-dominant arm for self care tasks. 07/12/22: Pt. Reports that he has been trying to incorporate his R hand while still using the L arm to complete all self care tasks.  Goal status: Ongoing  5. Pt. Will improve typing skills  by 1 min.  In preparation for typing an email correspondence  Baseline:  08/25/2022: Pt. Typed I sentence in 1 min. & 27 sec. With the left hand. Pt. Typed 1 sentence in 1 min. & 40 sec. With bilateral hands. Limited typing engagement with the right hand.  08/23/2022: Pt. Typed I sentence in 1 min. & 27 sec. With the left hand. Pt. Typed 1 sentence in 1 min. & 40 sec. With bilateral hands. Limited typing engagement with the right hand.    Goal status: New   ASSESSMENT:  CLINICAL IMPRESSION:  Pt. continues to present with tremors, however presents with less tremors this afternoon. Pt. continues tolerate the UE strengthening, grip, and pinch strengthening well today. Pt. requires rest breaks during the reaching tasks. Pt. continues to require cues to reposition the gripper in his hand. Pt. requires cues, and assist for each of the hand pinch positions on the clips. Pt. requires cues for visual demonstration of proper technique with each exercise.  Pt. Requires step-step cues for the hand position during the cube task.  Pt. continues to require rest breaks between each exercise. Pt.  Presented with less tremors today with more motor control, and coordination during ADLs/IADLs, and Pt. Continues to benefit from strategies for proximal stabilization during tasks for more distal control. Pt. continues to benefit from skilled OT for increasing bilateral hand strength, improving coordination, specifically working to  increase engagement of the RUE during self care tasks.    PERFORMANCE DEFICITS: in functional skills including ADLs, IADLs, coordination, dexterity, ROM, strength, flexibility, Fine motor control, Gross motor control, mobility, balance, body mechanics, endurance, decreased knowledge of use of DME, and UE functional use, cognitive skills including emotional and memory, and psychosocial skills including coping strategies, environmental adaptation, habits, and routines and behaviors.   IMPAIRMENTS: are limiting patient from ADLs, IADLs, and leisure.   CO-MORBIDITIES: has co-morbidities such as anxiety, depression  that affects occupational performance. Patient will benefit from skilled OT to address above impairments and improve overall function.  MODIFICATION OR ASSISTANCE TO COMPLETE EVALUATION: No modification of tasks or assist necessary to complete an evaluation.  OT OCCUPATIONAL PROFILE AND HISTORY: Problem focused assessment: Including review of records relating to presenting problem.  CLINICAL DECISION MAKING: Moderate - several treatment options, min-mod task modification necessary  REHAB POTENTIAL: Good for goals  EVALUATION COMPLEXITY: Moderate  PLAN:  OT FREQUENCY: 2x/week  OT DURATION: 12 weeks  PLANNED INTERVENTIONS: self care/ADL training, therapeutic exercise, therapeutic activity, neuromuscular re-education, manual therapy, passive range of motion, balance training, functional mobility training, moist heat, cryotherapy, patient/family education, cognitive remediation/compensation, psychosocial skills training, energy conservation, coping strategies training, and DME and/or AE instructions RECOMMENDED OTHER SERVICES: None at this time  CONSULTED AND AGREED WITH PLAN OF CARE: Patient  PLAN FOR NEXT SESSION: handwriting  Olegario Messier, M.S. OTR/L  09/20/22, 4:46 PM

## 2022-09-20 NOTE — Therapy (Addendum)
Outpatient Physical Therapy Treatment    Patient Details  Name: Charles Stanton MRN: [id number] Date of Birth: Jun 14, 1955 Referring Provider (PT): Dr. Cristopher Peru   Encounter Date: 09/20/2022  END OF SESSION:    PT End of Session - 09/20/22 1319     Visit Number 52    Number of Visits 62    Date for PT Re-Evaluation 10/11/22    Authorization Type Medicare A & B primary; federal employee secondary    Authorization Time Period -10/11/22    Progress Note Due on Visit 60    PT Start Time 1317    PT Stop Time 1358    PT Time Calculation (min) 41 min    Equipment Utilized During Treatment Gait belt    Activity Tolerance Patient tolerated treatment well;No increased pain    Behavior During Therapy [facility] for tasks assessed/performed                              Past Medical History:  Diagnosis Date   Abnormal liver function    Anemia    Anxiety    Atrial fibrillation (HCC)    Cancer (HCC) 2017   skin   Colonic mass    Depression    Dizziness    Dry cough    History of colon polyps    Hypercholesteremia    Hyperlipemia    Hypertension    Palpitations    Parkinson's disease    Parkinson's disease    PVD (peripheral vascular disease) (HCC)    Tonsillitis 03/25/2013   Tremors of nervous system     Past Surgical History:  Procedure Laterality Date   BACK SURGERY     Kyphoplasty   March 2018   COLONOSCOPY     COLONOSCOPY WITH PROPOFOL N/A 12/12/2017   Procedure: COLONOSCOPY WITH PROPOFOL;  Surgeon: Wyline Mood, MD;  Location: [facility] ENDOSCOPY;  Service: Gastroenterology;  Laterality: N/A;   ESOPHAGOGASTRODUODENOSCOPY N/A 12/11/2017   Procedure: ESOPHAGOGASTRODUODENOSCOPY (EGD);  Surgeon: Wyline Mood, MD;  Location: [facility] ENDOSCOPY;  Service: Gastroenterology;  Laterality: N/A;   ESOPHAGOGASTRODUODENOSCOPY N/A 10/08/2021   Procedure: ESOPHAGOGASTRODUODENOSCOPY (EGD);  Surgeon: Regis Bill, MD;  Location: [facility] ENDOSCOPY;  Service: Endoscopy;   Laterality: N/A;   ESOPHAGOGASTRODUODENOSCOPY (EGD) WITH PROPOFOL N/A 09/21/2016   Procedure: ESOPHAGOGASTRODUODENOSCOPY (EGD) WITH PROPOFOL;  Surgeon: Wyline Mood, MD;  Location: [facility] ENDOSCOPY;  Service: Gastroenterology;  Laterality: N/A;   ESOPHAGOGASTRODUODENOSCOPY (EGD) WITH PROPOFOL N/A 11/07/2016   Procedure: ESOPHAGOGASTRODUODENOSCOPY (EGD) WITH PROPOFOL;  Surgeon: Wyline Mood, MD;  Location: [facility] ENDOSCOPY;  Service: Gastroenterology;  Laterality: N/A;   ESOPHAGOGASTRODUODENOSCOPY (EGD) WITH PROPOFOL N/A 10/15/2019   Procedure: ESOPHAGOGASTRODUODENOSCOPY (EGD) WITH PROPOFOL;  Surgeon: Regis Bill, MD;  Location: [facility] ENDOSCOPY;  Service: Endoscopy;  Laterality: N/A;   ESOPHAGOGASTRODUODENOSCOPY (EGD) WITH PROPOFOL N/A 10/28/2019   Procedure: ESOPHAGOGASTRODUODENOSCOPY (EGD) WITH PROPOFOL;  Surgeon: Regis Bill, MD;  Location: [facility] ENDOSCOPY;  Service: Endoscopy;  Laterality: N/A;   ESOPHAGOGASTRODUODENOSCOPY (EGD) WITH PROPOFOL N/A 12/02/2019   Procedure: ESOPHAGOGASTRODUODENOSCOPY (EGD) WITH PROPOFOL;  Surgeon: Regis Bill, MD;  Location: [facility] ENDOSCOPY;  Service: Endoscopy;  Laterality: N/A;   ESOPHAGOGASTRODUODENOSCOPY (EGD) WITH PROPOFOL N/A 02/18/2020   Procedure: ESOPHAGOGASTRODUODENOSCOPY (EGD) WITH PROPOFOL;  Surgeon: Regis Bill, MD;  Location: [facility] ENDOSCOPY;  Service: Endoscopy;  Laterality: N/A;   ESOPHAGOGASTRODUODENOSCOPY (EGD) WITH PROPOFOL N/A 08/02/2021   Procedure: ESOPHAGOGASTRODUODENOSCOPY (EGD) WITH PROPOFOL;  Surgeon: Regis Bill, MD;  Location: [facility] ENDOSCOPY;  Service: Endoscopy;  Laterality: N/A;  REQUEST AM   ESOPHAGOGASTRODUODENOSCOPY (EGD) WITH PROPOFOL N/A 10/05/2021   Procedure: ESOPHAGOGASTRODUODENOSCOPY (EGD) WITH PROPOFOL;  Surgeon: Regis Bill, MD;  Location: [facility] ENDOSCOPY;  Service: Endoscopy;  Laterality: N/A;   FRACTURE SURGERY Right 05/28/2019   distal radius fracture   HERNIA REPAIR     left inguinal  hernia repair as an infant   KYPHOPLASTY N/A 02/11/2016   Procedure: KYPHOPLASTY  L2,T9;  Surgeon: Kennedy Bucker, MD;  Location: [facility] ORS;  Service: Orthopedics;  Laterality: N/A;   KYPHOPLASTY N/A 03/15/2016   Procedure: KYPHOPLASTY L3;  Surgeon: Kennedy Bucker, MD;  Location: [facility] ORS;  Service: Orthopedics;  Laterality: N/A;   OPEN REDUCTION INTERNAL FIXATION (ORIF) DISTAL RADIAL FRACTURE Right 05/28/2019   Procedure: OPEN REDUCTION INTERNAL FIXATION (ORIF) DISTAL RADIAL FRACTURE;  Surgeon: Kennedy Bucker, MD;  Location: [facility] ORS;  Service: Orthopedics;  Laterality: Right;   skin cancer removed     TONSILLECTOMY       Subjective Assessment -      Subjective  Pt reports doing well today. Pt denies any recent falls/stumbles since prior session. Pt denies any updates to medications or medical appointment since prior session. Pt reports good compliance with HEP when time permits.         Pertinent History Charles Stanton is a 66yoM who was prescribed with Parkinson's Disease ~ 10 years.  Pt is followed by neurology, reports good reponse to medications which help control his tremors somewhat. Pt uses a SPC for AMB, also uses a walker for longer distance walking. Pt goes into community almost daily (they like to eat out) which includes lots of SPC [place] eand navigation of 4 stairs at home.    Currently in Pain? No/denies             TREATMENT:  Unless otherwise stated, CGA was provided and gait belt donned in order to ensure pt safety   TE:  Ambulation with 3# AW x 300 ft 10 x PWR! STS  Ambulation with 3# AW x 300 ft   LAQ 2 x 10 with 3# AW ea LE   NMR  Seated PWR! Moves ( up x 15, rock x 10 ea , twist x 10 ea, step x 10 ea  ) with 1# wrist weights donned   Keeping 1# wrist weights donned:  Standing PWR! Up, twist, step, and rock with hands on back of armchair modification x 10 to ea side   On airex  PRW! Rock standing 2 x 10 ea LE  Adducted stance 2 x 45 sec   Pt required occasional  rest breaks due fatigue, PT was quick to ask when pt appeared to be fatiguing in order to prevent excessive fatigue.  PWR! Up works on postural strengthening and antigravity extension, PRW! Rock working on [employer] shifting, PRW! Twist targeting trunk rotation and PRW! Step targeting transition movements. PWR! Moves target bradykinesia, rigidity, and dyskinesia through targeted functional movements that address four core movement difficulties for people with Parkinson's disease.     PT Education -     Education Details Pt educated throughout session about proper posture and technique with exercises. Improved exercise technique, movement at target joints, use of target muscles after min to mod verbal, visual, tactile cues.      Person(s) Educated Patient    Methods Explanation;Demonstration ; handout   Comprehension Verbalized understanding;Returned demonstration;Need further instruction           PT Short Term Goals -  PT SHORT TERM GOAL #1   Title Pt to report regular performance of exercises prescribed for home and a sense of improvements in strength and balance AEB them not being as challenging anymore.    Baseline Will issue on visit 2    Time 4    Period Weeks    Status met   Target Date 03/15/22      PT SHORT TERM GOAL #2   Title Pt to demonstrate improved power AEB improved 5xSTS hands free in <18sec.    Baseline 03/01/22: 22sec hands free, author provideing foot block bilat 03/31/22: 14.82sec 4/23:17.3 sec   Time 4    Period Weeks    Status Met    Target Date 03/29/22               PT Long Term Goals - Target goal date for all remaining long term goals is 07/19/2022        PT LONG TERM GOAL #1   Title Pt to improve score on FOTO survey to 57 to indicate reduced difficulty with basic mobility required for ADL performance.    Baseline 03/01/22:49 04/29/22:56%. 06/16/22: 60  09/08/22:60   Time 8    Period Weeks    Status Met          PT LONG TERM  GOAL #2   Title Pt to improve tolerance to overground AMB c SPC AEB performance >864ft.    Baseline 2/27: fatigued after 46ft lap performed in four minutes ten seconds. 03/31/22: 734ft with SPC no rest break 04/26/22: 794 ft 06/16/22: 903 ft with SPC, no rest.   Time 8    Period Weeks    Status Met         PT LONG TERM GOAL #3   Title Pt to demonstrate improved leg power AEB 5xSTS from chair <14sec hands free and without need for minGuard assist for safety and without need for foot block.    Baseline 03/01/22: 22sec hands free, feet blocked, minGuard assist  03/31/22: 14.82sec  4/23:17.3 sec 06/16/22: 13.87 sec 7/16: 11.55 sec   Time 8     Period Weeks    Status MET         PT LONG TERM GOAL #4   Title Pt to demonstrate improvement in balance performance AEB >6 improvement BERG Balance for balance assessment.    Baseline 41 on 03/03/22. 3/38/24: 42 4/23: 45 06/16/22: 48   Time 8    Period Weeks    Status Met          5.  Pt will improve distance with LRAD to 900 feet or greater in order to indicate further progress to community ambulation distances Baseline: 794 ft with SPC , 06/16/22: 903 ft with SPC. 07/19/22: 905 feet Goal status: Met  6.  Patient will improve modified DGI (with use of cane) score > 4 points or greater in order to indicate improved balance and decreased risk of falls.  Baseline: 6/18: 11 7/16: 14  Goal status: IN PROGRESS  7. Patient will be able to withstand various light perturbations while preforming ambulation with LRAD utilizing safe balance strategies in order to simulate safe and confident walking in crowded places. Baseline: Pt currently does not feel safe preforming task, could walk through cafeteria to get baseline. 7/23: 70% confidence with activity, mild LOB with perturbations.  9/5: able to complete this task on multiple occasions without LOB.   Goal Status:MET   8. Pt will be compliant  and independent with Parkinson's based HEP by reciting  and demonstrating full PWR! Move sets in seated and or standing with no cueing from therapist. Baseline: Pt currently needs demonstration or verbal cueing for proper completion  7/23: cues required for proper completion with PWR! Up, rock, and twist in seated position  Goal Status: IN PROGRESS     Plan     Clinical Impression Statement Patient arrived with good motivation form completion of pt activities.  Pt reports good compliance with HEP. Pt continues with PD specific activities to enhance his movement patterns, balance, and mobility. Pt shows good initiation of movement despite continued difficulty with tremors this date. Pt will continue to benefit from skilled physical therapy intervention to address impairments, improve QOL, and attain therapy goals.        Personal Factors and Comorbidities Time since onset of injury/illness/exacerbation;Past/Current Experience    Examination-Activity Limitations Locomotion Level;Transfers;Bed Mobility;Reach Overhead;Hygiene/Grooming;Dressing;Toileting;Stairs;Bend    Stability/Clinical Decision Making Evolving/Moderate complexity    Clinical Decision Making Moderate    Rehab Potential Good    PT Frequency 2x / week    PT Duration 8 weeks    PT Treatment/Interventions ADLs/Self Care Home Management;Moist Heat;Gait training;Stair training;Functional mobility training;Therapeutic activities;Therapeutic exercise;Balance training;Neuromuscular re-education;Patient/family education    PT Next Visit Plan Continue plan    PT Home Exercise Plan 03/01/22: discussed finding and elevated sit surface for future STS exercises (something ~18-19 inches high)    Consulted and Agree with Plan of Care Patient             Patient will benefit from skilled therapeutic intervention in order to improve the following deficits and impairments:  balance, strength, coordination, gait, transfers. Posture. ROM   Visit Diagnosis: Muscle weakness  (generalized)  Abnormality of gait and mobility  Difficulty in walking, not elsewhere classified  Other abnormalities of gait and mobility  Unsteadiness on feet    Problem List Patient Active Problem List   Diagnosis Date Noted   Acute respiratory failure due to COVID-19 (HCC) 05/28/2019   Alcohol dependence with uncomplicated withdrawal (HCC) 01/23/2018   Alcohol withdrawal (HCC) 01/23/2018   GIB (gastrointestinal bleeding) 12/09/2017   A-fib (HCC) 12/09/2017   Esophageal obstruction 10/10/2016   Esophageal ulceration 10/10/2016   GI bleed 09/20/2016   Low vitamin B12 level 03/18/2016   S/P kyphoplasty 03/18/2016   Basal cell carcinoma 02/08/2016   Atrial fibrillation (HCC) 01/27/2016   Colitis 01/27/2016   HLD (hyperlipidemia) 01/27/2016   Anxiety 01/27/2016   Hypokalemia 01/27/2016   Major depressive disorder, recurrent episode, moderate (HCC) 04/14/2015   Anxiety, generalized 12/06/2013   Parkinson disease 08/07/2013   Dysphagia 05/12/2013   Esophageal mass 05/12/2013   GERD (gastroesophageal reflux disease) 05/12/2013   Vomiting 05/12/2013   Open bite of lower leg 05/10/2013   Dog bite of lower leg 05/10/2013   Abnormal finding on liver function 03/25/2013   Benign neoplasm 03/25/2013   Dizziness 03/25/2013   Type 2 diabetes mellitus (HCC) 03/25/2013   BP (high blood pressure) 03/25/2013   Awareness of heartbeats 03/25/2013   Pure hypercholesterolemia 03/25/2013   Infective tonsillitis 03/25/2013   Adenomatous polyp 03/25/2013      Norman Herrlich PT ,DPT Physical Therapist- [place]  [facility]

## 2022-09-22 ENCOUNTER — Ambulatory Visit: Payer: Medicare Other | Admitting: Occupational Therapy

## 2022-09-22 ENCOUNTER — Ambulatory Visit: Payer: Medicare Other | Admitting: Physical Therapy

## 2022-09-22 DIAGNOSIS — M6281 Muscle weakness (generalized): Secondary | ICD-10-CM

## 2022-09-22 DIAGNOSIS — R262 Difficulty in walking, not elsewhere classified: Secondary | ICD-10-CM

## 2022-09-22 DIAGNOSIS — R2689 Other abnormalities of gait and mobility: Secondary | ICD-10-CM

## 2022-09-22 DIAGNOSIS — R269 Unspecified abnormalities of gait and mobility: Secondary | ICD-10-CM

## 2022-09-22 DIAGNOSIS — R2681 Unsteadiness on feet: Secondary | ICD-10-CM

## 2022-09-22 NOTE — Therapy (Addendum)
Occupational Therapy Neuro Treatment Note   Patient Name: Charles Stanton MRN: [id number] DOB:1955/05/19, 67 y.o., male Today]'s Date: 09/22/2022  PCP: Dr. Barbette Reichmann REFERRING PROVIDER: Dr. Cristopher Peru  END OF SESSION:  OT End of Session - 09/22/22 1504     Visit Number 27    Number of Visits 48    Date for OT Re-Evaluation 11/15/22    OT Start Time 1400    OT Stop Time 1445    OT Time Calculation (min) 45 min    Activity Tolerance Patient tolerated treatment well    Behavior During Therapy [facility] for tasks assessed/performed             Past Medical History:  Diagnosis Date   Abnormal liver function    Anemia    Anxiety    Atrial fibrillation (HCC)    Cancer (HCC) 2017   skin   Colonic mass    Depression    Dizziness    Dry cough    History of colon polyps    Hypercholesteremia    Hyperlipemia    Hypertension    Palpitations    Parkinson's disease    Parkinson's disease    PVD (peripheral vascular disease) (HCC)    Tonsillitis 03/25/2013   Tremors of nervous system    Past Surgical History:  Procedure Laterality Date   BACK SURGERY     Kyphoplasty   March 2018   COLONOSCOPY     COLONOSCOPY WITH PROPOFOL N/A 12/12/2017   Procedure: COLONOSCOPY WITH PROPOFOL;  Surgeon: Wyline Mood, MD;  Location: [facility] ENDOSCOPY;  Service: Gastroenterology;  Laterality: N/A;   ESOPHAGOGASTRODUODENOSCOPY N/A 12/11/2017   Procedure: ESOPHAGOGASTRODUODENOSCOPY (EGD);  Surgeon: Wyline Mood, MD;  Location: [facility] ENDOSCOPY;  Service: Gastroenterology;  Laterality: N/A;   ESOPHAGOGASTRODUODENOSCOPY N/A 10/08/2021   Procedure: ESOPHAGOGASTRODUODENOSCOPY (EGD);  Surgeon: Regis Bill, MD;  Location: [facility] ENDOSCOPY;  Service: Endoscopy;  Laterality: N/A;   ESOPHAGOGASTRODUODENOSCOPY (EGD) WITH PROPOFOL N/A 09/21/2016   Procedure: ESOPHAGOGASTRODUODENOSCOPY (EGD) WITH PROPOFOL;  Surgeon: Wyline Mood, MD;  Location: [facility] ENDOSCOPY;  Service: Gastroenterology;  Laterality: N/A;    ESOPHAGOGASTRODUODENOSCOPY (EGD) WITH PROPOFOL N/A 11/07/2016   Procedure: ESOPHAGOGASTRODUODENOSCOPY (EGD) WITH PROPOFOL;  Surgeon: Wyline Mood, MD;  Location: [facility] ENDOSCOPY;  Service: Gastroenterology;  Laterality: N/A;   ESOPHAGOGASTRODUODENOSCOPY (EGD) WITH PROPOFOL N/A 10/15/2019   Procedure: ESOPHAGOGASTRODUODENOSCOPY (EGD) WITH PROPOFOL;  Surgeon: Regis Bill, MD;  Location: [facility] ENDOSCOPY;  Service: Endoscopy;  Laterality: N/A;   ESOPHAGOGASTRODUODENOSCOPY (EGD) WITH PROPOFOL N/A 10/28/2019   Procedure: ESOPHAGOGASTRODUODENOSCOPY (EGD) WITH PROPOFOL;  Surgeon: Regis Bill, MD;  Location: [facility] ENDOSCOPY;  Service: Endoscopy;  Laterality: N/A;   ESOPHAGOGASTRODUODENOSCOPY (EGD) WITH PROPOFOL N/A 12/02/2019   Procedure: ESOPHAGOGASTRODUODENOSCOPY (EGD) WITH PROPOFOL;  Surgeon: Regis Bill, MD;  Location: [facility] ENDOSCOPY;  Service: Endoscopy;  Laterality: N/A;   ESOPHAGOGASTRODUODENOSCOPY (EGD) WITH PROPOFOL N/A 02/18/2020   Procedure: ESOPHAGOGASTRODUODENOSCOPY (EGD) WITH PROPOFOL;  Surgeon: Regis Bill, MD;  Location: [facility] ENDOSCOPY;  Service: Endoscopy;  Laterality: N/A;   ESOPHAGOGASTRODUODENOSCOPY (EGD) WITH PROPOFOL N/A 08/02/2021   Procedure: ESOPHAGOGASTRODUODENOSCOPY (EGD) WITH PROPOFOL;  Surgeon: Regis Bill, MD;  Location: [facility] ENDOSCOPY;  Service: Endoscopy;  Laterality: N/A;  REQUEST AM   ESOPHAGOGASTRODUODENOSCOPY (EGD) WITH PROPOFOL N/A 10/05/2021   Procedure: ESOPHAGOGASTRODUODENOSCOPY (EGD) WITH PROPOFOL;  Surgeon: Regis Bill, MD;  Location: [facility] ENDOSCOPY;  Service: Endoscopy;  Laterality: N/A;   FRACTURE SURGERY Right 05/28/2019   distal radius fracture   HERNIA REPAIR     left inguinal  hernia repair as an infant   KYPHOPLASTY N/A 02/11/2016   Procedure: KYPHOPLASTY  L2,T9;  Surgeon: Kennedy Bucker, MD;  Location: [facility] ORS;  Service: Orthopedics;  Laterality: N/A;   KYPHOPLASTY N/A 03/15/2016   Procedure: KYPHOPLASTY L3;  Surgeon:  Kennedy Bucker, MD;  Location: [facility] ORS;  Service: Orthopedics;  Laterality: N/A;   OPEN REDUCTION INTERNAL FIXATION (ORIF) DISTAL RADIAL FRACTURE Right 05/28/2019   Procedure: OPEN REDUCTION INTERNAL FIXATION (ORIF) DISTAL RADIAL FRACTURE;  Surgeon: Kennedy Bucker, MD;  Location: [facility] ORS;  Service: Orthopedics;  Laterality: Right;   skin cancer removed     TONSILLECTOMY     Patient Active Problem List   Diagnosis Date Noted   Acute respiratory failure due to COVID-19 (HCC) 05/28/2019   Alcohol dependence with uncomplicated withdrawal (HCC) 01/23/2018   Alcohol withdrawal (HCC) 01/23/2018   GIB (gastrointestinal bleeding) 12/09/2017   A-fib (HCC) 12/09/2017   Esophageal obstruction 10/10/2016   Esophageal ulceration 10/10/2016   GI bleed 09/20/2016   Low vitamin B12 level 03/18/2016   S/P kyphoplasty 03/18/2016   Basal cell carcinoma 02/08/2016   Atrial fibrillation (HCC) 01/27/2016   Colitis 01/27/2016   HLD (hyperlipidemia) 01/27/2016   Anxiety 01/27/2016   Hypokalemia 01/27/2016   Major depressive disorder, recurrent episode, moderate (HCC) 04/14/2015   Anxiety, generalized 12/06/2013   Parkinson disease 08/07/2013   Dysphagia 05/12/2013   Esophageal mass 05/12/2013   GERD (gastroesophageal reflux disease) 05/12/2013   Vomiting 05/12/2013   Open bite of lower leg 05/10/2013   Dog bite of lower leg 05/10/2013   Abnormal finding on liver function 03/25/2013   Benign neoplasm 03/25/2013   Dizziness 03/25/2013   Type 2 diabetes mellitus (HCC) 03/25/2013   BP (high blood pressure) 03/25/2013   Awareness of heartbeats 03/25/2013   Pure hypercholesterolemia 03/25/2013   Infective tonsillitis 03/25/2013   Adenomatous polyp 03/25/2013   ONSET DATE: 10 years   REFERRING DIAG: Parkinson's Disease  THERAPY DIAG:  Muscle weakness (generalized)  Rationale for Evaluation and Treatment: Rehabilitation  SUBJECTIVE:  SUBJECTIVE STATEMENT:  Pt. reports that he is tired following  PT  Pt accompanied by: self; spouse present in lobby, but did not follow for eval  PERTINENT HISTORY: Pt has been working with outpatient PT here at [facility] since end of Feb 2024.  OT ordered d/t worsening PD symptoms contributing to functional decline with ADLs.   PRECAUTIONS: Fall  WEIGHT BEARING RESTRICTIONS: No  PAIN:  Are you having pain? No pain reported today.  FALLS: Has patient fallen in last 6 months? Yes. Number of falls 1  LIVING ENVIRONMENT: Lives with: lives with their spouse Lives in: split level but pt remains on main level to avoid steps Stairs: Yes: Internal: 2 steps; can reach both Has following equipment at home: Quad cane small base, Walker - 4 wheeled, bed side commode, Grab bars, and pull down seat for shower, hand held shower hose,   PLOF: Needs assistance with ADLs; pt reports that spouse assists but is consistent to always let pt try things first before she automatically helps.    PATIENT GOALS: To be more indep  OBJECTIVE:  HAND DOMINANCE: Right (most affected side from PD)  ADLs: Overall ADLs: spouse is primary caregiver Transfers/ambulation related to ADLs: uses RW Eating: spouse cuts food; pt reports having to use L non-dominant hand to eat for the last 5-6 yrs Grooming: set up; assist to squeeze toothpaste and denture paste; uses L non-dominant hand UB Dressing: set up; assist with clothing fasteners LB  Dressing: Min A; assist with socks, dons slip on shoes with set up (has tried a sockaid but was unsuccessful) Toileting: modified indep Bathing: Set up/min A; spouse squeezes shampoo into pt's hands, spouse helps to towel dry  Tub Shower transfers: distant supv Equipment:  see above   IADLs: Shopping: pt can accompany spouse by pushing a shopping cart.  Light housekeeping: pt can drag trash to bin and can roll the bin to the road and back Meal Prep: spouse manages  Community mobility: spouse drives; uses rollator for longer distances  Medication  management: spouse manages pills using weekly pill organizer.   Financial management: spouse manages  Handwriting: Mild micrographia and <25% legible  MOBILITY STATUS: Hx of falls  POSTURE COMMENTS:  rounded shoulders and forward head, R shoulder elevated  ACTIVITY TOLERANCE: Activity tolerance: TBD and assessed further with functional activities   FUNCTIONAL OUTCOME MEASURES: FOTO: TBD   UPPER EXTREMITY ROM:    Active ROM Right eval Left eval Right 07/12/2022 Left 07/12/2022 Right 08/28/2022 Left  08/28/2022  Shoulder flexion 118 85 120 117 124 120  Shoulder abduction 128 124 132 127 132 130  Shoulder adduction        Shoulder extension        Shoulder internal rotation [facility] WFL  Shoulder external rotation [facility] WFL  Elbow flexion        Elbow extension        Wrist flexion 55 76 59 75 59 75  Wrist extension 57 75 61 71 61 71  Wrist ulnar deviation        Wrist radial deviation        Wrist pronation [facility] WFL  Wrist supination 63 75 66 81 55 81  (Blank rows = not tested)  UPPER EXTREMITY MMT:     MMT Right eval Left eval Right 07/12/2022 Left 07/12/2022 Right 08/23/2022 Left 08/23/2022  Shoulder flexion 4 4 4+ 4+ 4+ 4+  Shoulder abduction 4+ 4+ 4+ 4+ 4+ 4+  Shoulder adduction        Shoulder extension        Shoulder internal rotation        Shoulder external rotation        Middle trapezius        Lower trapezius        Elbow flexion 4+ 4+ 4+ 4+ 5 5  Elbow extension 4+ 4+ 4+ 4+ 4+ 4+  Wrist flexion 4+ 4+ 4+ 4+ 4+ 4+  Wrist extension 4+ 4+ 4+ 4+ 4+ 4+  Wrist ulnar deviation        Wrist radial deviation        Wrist pronation        Wrist supination        (Blank rows = not tested)  HAND FUNCTION: Grip strength: Right: 16 lbs; Left: 38 lbs, Lateral pinch: Right: 10 lbs, Left: 14 lbs, and 3 point pinch: Right: 9 lbs, Left: 11 lbs 07/12/2022: Grip strength: Right: 16 lbs; Left: 40 lbs, Lateral pinch: Right: 10 lbs,  Left: 14 lbs, and 3 point pinch: Right: 9 lbs, Left: 15 lbs  08/23/2022: Grip strength: Right: 7 lbs; Left: 30 lbs, Lateral pinch: Right: 10 lbs, Left: 14 lbs, and 3 point pinch: Right: 6 lbs, Left: 14 lbs COORDINATION: 9 Hole Peg test: Right: 38 sec; Left: 40 sec 07/12/2022: 9 Hole Peg test: Right: 35 sec; Left: 36 sec 08/23/2022: 9 Hole Peg  test: Right: 51 sec; Left: 39 sec SENSATION: WFL  EDEMA: None  MUSCLE TONE: R/L normal  COGNITION: Overall cognitive status: Within functional limits for tasks assessed  VISION: Subjective report: wears glasses all the time    PRAXIS: Impaired: Motor planning  OBSERVATIONS:  R SF PIP flexion contracture 105* from 5-6 years ago from a fall  TODAY'S TREATMENT:                                                                                                                              DATE: 09/22/2022:  There. Ex.:  Pt. worked on [employer], and reciprocal motion using the UBE while seated for 8 min. with no resistance. constant monitoring was provided. Pt. worked on using bilateral UE's to grasp large flat shapes, and reach up to move them through 4 vertical dowels of progressively increasing heights. Pt. Utilized a 2# wrist cuff weight in place while reaching. Pt. worked on pinch strengthening in the bilateral hands for lateral, and 3pt. pinch using yellow, red, green, and blue resistive clips. Pt. worked on placing the clips at various vertical and horizontal angles. Tactile and verbal cues were required for eliciting the desired movement.               PATIENT EDUCATION: Education details:  UE strengthening Person educated: Patient Education method: Explanation and Verbal cues, demo Education comprehension: verbalized understanding, demonstrated understanding  HOME EXERCISE PROGRAM: Theraputty   GOALS: Goals reviewed with patient? Yes  SHORT TERM GOALS: Target date: 10/04/2022   (6 weeks)  Pt will be indep to perform HEP  for improving bilat hand strength and coordination skills. Baseline: Eval: not yet initiated 07/12/2022: Completing theraputty exercises independent at home Goal status: Met/Achieved  LONG TERM GOALS: Target date: 11/15/2022 (12 weeks)  Pt will increase FOTO score to (TBD) or better to indicate improvement in self perceived functional use of the R arm with daily tasks. Baseline: Eval: TBD 07/12/2022: TBD Goal status: Discontinued  2.  Pt will increase R grip strength by 5 or more lbs to more easily hold and stabilize ADL supplies in the R dominant hand. Baseline: 08/25/2022: Grip strength: Right: 7 lbs; Left: 30 lbs 08/23/2022:  Grip strength: Right: 7 lbs; Left: 30 lbs R grip 16 lbs; limited from PD but also by 5th digit PIP flexion contracture, as this digit can not engage in gripping (L 38 lbs) 07/12/2022: Grip strength: Right: 16 lbs; limited from PD but also by 5th digit PIP flexion contracture, as this digit still can not engage in gripping (L 40 lbs) Goal status: Ongoing  3.  Pt will improve R hand FMC/dexterity skills to be able to sign his name on medical/legal documents with 75% legibility, using adapted writing aids as needed. Baseline: 08/25/2022: Improving writing, however this is dependent on whether or not he is have a bad day with tremors 08/23/2022: Improving writing, however this is dependent on whether or not he is  have a bad day with tremors. Eval: <25% legible and pt verbalizes embarrassment when attempting to sign his name on forms 07/12/2022: 75% legibility when writing his name in print, however reports he is still not satisfied with his signature looks. Goal status: Ongoing  4.  Pt will increase R FMC/GMC skills to engage the RUE into UB ADLs at least 50% of the time. Baseline:  08/25/2022: Pt. Is trying to engage his right UE more during daily ADL, and IADL tasks. Eval: Pt uses the L non-dominant arm for self care tasks. 08/23/2022: Pt. Is trying to engage his right UE more during  daily ADL, and IADL tasks. Eval: Pt uses the L non-dominant arm for self care tasks. 07/12/22: Pt. Reports that he has been trying to incorporate his R hand while still using the L arm to complete all self care tasks.  Goal status: Ongoing  5. Pt. Will improve typing skills  by 1 min.  In preparation for typing an email correspondence  Baseline:  08/25/2022: Pt. Typed I sentence in 1 min. & 27 sec. With the left hand. Pt. Typed 1 sentence in 1 min. & 40 sec. With bilateral hands. Limited typing engagement with the right hand.  08/23/2022: Pt. Typed I sentence in 1 min. & 27 sec. With the left hand. Pt. Typed 1 sentence in 1 min. & 40 sec. With bilateral hands. Limited typing engagement with the right hand.    Goal status: New   ASSESSMENT:  CLINICAL IMPRESSION:  Pt. continues tolerate the UE strengthening, grip, and pinch strengthening well today. Pt. was able to tolerate the 2# wrist cuff weight in place while reaching without support proximally. Pt. requires rest breaks during the reaching tasks. Pt. requires cues to reposition for clips placement in the hand for lateral, and 3pt. pinch. Pt. requires cues, and assist for each of the hand pinch positions on the clips. Pt. requires cues for visual demonstration of proper technique with each exercise. Pt. continues to require rest breaks between each exercise. Pt.  Presented with less tremors today with more motor control, and coordination during ADLs/IADLs, and Pt. continues to benefit from strategies for proximal stabilization during tasks for more distal control. Pt. continues to benefit from skilled OT for increasing bilateral hand strength, improving coordination, specifically working to increase engagement of the RUE during self care tasks.    PERFORMANCE DEFICITS: in functional skills including ADLs, IADLs, coordination, dexterity, ROM, strength, flexibility, Fine motor control, Gross motor control, mobility, balance, body mechanics, endurance,  decreased knowledge of use of DME, and UE functional use, cognitive skills including emotional and memory, and psychosocial skills including coping strategies, environmental adaptation, habits, and routines and behaviors.   IMPAIRMENTS: are limiting patient from ADLs, IADLs, and leisure.   CO-MORBIDITIES: has co-morbidities such as anxiety, depression  that affects occupational performance. Patient will benefit from skilled OT to address above impairments and improve overall function.  MODIFICATION OR ASSISTANCE TO COMPLETE EVALUATION: No modification of tasks or assist necessary to complete an evaluation.  OT OCCUPATIONAL PROFILE AND HISTORY: Problem focused assessment: Including review of records relating to presenting problem.  CLINICAL DECISION MAKING: Moderate - several treatment options, min-mod task modification necessary  REHAB POTENTIAL: Good for goals  EVALUATION COMPLEXITY: Moderate    PLAN:  OT FREQUENCY: 2x/week  OT DURATION: 12 weeks  PLANNED INTERVENTIONS: self care/ADL training, therapeutic exercise, therapeutic activity, neuromuscular re-education, manual therapy, passive range of motion, balance training, functional mobility training, moist heat, cryotherapy, patient/family education, cognitive  remediation/compensation, psychosocial skills training, energy conservation, coping strategies training, and DME and/or AE instructions RECOMMENDED OTHER SERVICES: None at this time  CONSULTED AND AGREED WITH PLAN OF CARE: Patient  PLAN FOR NEXT SESSION: handwriting  Olegario Messier, M.S. OTR/L  09/22/22, 3:13 PM

## 2022-09-22 NOTE — Therapy (Signed)
Outpatient Physical Therapy Treatment    Patient Details  Name: Charles Stanton MRN: [id number] Date of Birth: 08-Sep-1955 Referring Provider (PT): Dr. Cristopher Peru   Encounter Date: 09/22/2022  END OF SESSION:    PT End of Session - 09/22/22 1331     Visit Number 53    Number of Visits 62    Date for PT Re-Evaluation 10/11/22    Authorization Type Medicare A & B primary; federal employee secondary    Authorization Time Period -10/11/22    Progress Note Due on Visit 60    PT Start Time 1317    PT Stop Time 1359    PT Time Calculation (min) 42 min    Equipment Utilized During Treatment Gait belt    Activity Tolerance Patient tolerated treatment well;No increased pain    Behavior During Therapy [facility] for tasks assessed/performed                              Past Medical History:  Diagnosis Date   Abnormal liver function    Anemia    Anxiety    Atrial fibrillation (HCC)    Cancer (HCC) 2017   skin   Colonic mass    Depression    Dizziness    Dry cough    History of colon polyps    Hypercholesteremia    Hyperlipemia    Hypertension    Palpitations    Parkinson's disease    Parkinson's disease    PVD (peripheral vascular disease) (HCC)    Tonsillitis 03/25/2013   Tremors of nervous system     Past Surgical History:  Procedure Laterality Date   BACK SURGERY     Kyphoplasty   March 2018   COLONOSCOPY     COLONOSCOPY WITH PROPOFOL N/A 12/12/2017   Procedure: COLONOSCOPY WITH PROPOFOL;  Surgeon: Wyline Mood, MD;  Location: [facility] ENDOSCOPY;  Service: Gastroenterology;  Laterality: N/A;   ESOPHAGOGASTRODUODENOSCOPY N/A 12/11/2017   Procedure: ESOPHAGOGASTRODUODENOSCOPY (EGD);  Surgeon: Wyline Mood, MD;  Location: [facility] ENDOSCOPY;  Service: Gastroenterology;  Laterality: N/A;   ESOPHAGOGASTRODUODENOSCOPY N/A 10/08/2021   Procedure: ESOPHAGOGASTRODUODENOSCOPY (EGD);  Surgeon: Regis Bill, MD;  Location: [facility] ENDOSCOPY;  Service: Endoscopy;   Laterality: N/A;   ESOPHAGOGASTRODUODENOSCOPY (EGD) WITH PROPOFOL N/A 09/21/2016   Procedure: ESOPHAGOGASTRODUODENOSCOPY (EGD) WITH PROPOFOL;  Surgeon: Wyline Mood, MD;  Location: [facility] ENDOSCOPY;  Service: Gastroenterology;  Laterality: N/A;   ESOPHAGOGASTRODUODENOSCOPY (EGD) WITH PROPOFOL N/A 11/07/2016   Procedure: ESOPHAGOGASTRODUODENOSCOPY (EGD) WITH PROPOFOL;  Surgeon: Wyline Mood, MD;  Location: [facility] ENDOSCOPY;  Service: Gastroenterology;  Laterality: N/A;   ESOPHAGOGASTRODUODENOSCOPY (EGD) WITH PROPOFOL N/A 10/15/2019   Procedure: ESOPHAGOGASTRODUODENOSCOPY (EGD) WITH PROPOFOL;  Surgeon: Regis Bill, MD;  Location: [facility] ENDOSCOPY;  Service: Endoscopy;  Laterality: N/A;   ESOPHAGOGASTRODUODENOSCOPY (EGD) WITH PROPOFOL N/A 10/28/2019   Procedure: ESOPHAGOGASTRODUODENOSCOPY (EGD) WITH PROPOFOL;  Surgeon: Regis Bill, MD;  Location: [facility] ENDOSCOPY;  Service: Endoscopy;  Laterality: N/A;   ESOPHAGOGASTRODUODENOSCOPY (EGD) WITH PROPOFOL N/A 12/02/2019   Procedure: ESOPHAGOGASTRODUODENOSCOPY (EGD) WITH PROPOFOL;  Surgeon: Regis Bill, MD;  Location: [facility] ENDOSCOPY;  Service: Endoscopy;  Laterality: N/A;   ESOPHAGOGASTRODUODENOSCOPY (EGD) WITH PROPOFOL N/A 02/18/2020   Procedure: ESOPHAGOGASTRODUODENOSCOPY (EGD) WITH PROPOFOL;  Surgeon: Regis Bill, MD;  Location: [facility] ENDOSCOPY;  Service: Endoscopy;  Laterality: N/A;   ESOPHAGOGASTRODUODENOSCOPY (EGD) WITH PROPOFOL N/A 08/02/2021   Procedure: ESOPHAGOGASTRODUODENOSCOPY (EGD) WITH PROPOFOL;  Surgeon: Regis Bill, MD;  Location: [facility] ENDOSCOPY;  Service: Endoscopy;  Laterality: N/A;  REQUEST AM   ESOPHAGOGASTRODUODENOSCOPY (EGD) WITH PROPOFOL N/A 10/05/2021   Procedure: ESOPHAGOGASTRODUODENOSCOPY (EGD) WITH PROPOFOL;  Surgeon: Regis Bill, MD;  Location: [facility] ENDOSCOPY;  Service: Endoscopy;  Laterality: N/A;   FRACTURE SURGERY Right 05/28/2019   distal radius fracture   HERNIA REPAIR     left inguinal  hernia repair as an infant   KYPHOPLASTY N/A 02/11/2016   Procedure: KYPHOPLASTY  L2,T9;  Surgeon: Kennedy Bucker, MD;  Location: [facility] ORS;  Service: Orthopedics;  Laterality: N/A;   KYPHOPLASTY N/A 03/15/2016   Procedure: KYPHOPLASTY L3;  Surgeon: Kennedy Bucker, MD;  Location: [facility] ORS;  Service: Orthopedics;  Laterality: N/A;   OPEN REDUCTION INTERNAL FIXATION (ORIF) DISTAL RADIAL FRACTURE Right 05/28/2019   Procedure: OPEN REDUCTION INTERNAL FIXATION (ORIF) DISTAL RADIAL FRACTURE;  Surgeon: Kennedy Bucker, MD;  Location: [facility] ORS;  Service: Orthopedics;  Laterality: Right;   skin cancer removed     TONSILLECTOMY       Subjective Assessment -      Subjective  Pt reports doing well today. He states his dog is doing better which is relieving for him and his family.       Pertinent History Charles Stanton is a 66yoM who was prescribed with Parkinson's Disease ~ 10 years.  Pt is followed by neurology, reports good reponse to medications which help control his tremors somewhat. Pt uses a SPC for AMB, also uses a walker for longer distance walking. Pt goes into community almost daily (they like to eat out) which includes lots of SPC [place] eand navigation of 4 stairs at home.    Currently in Pain? No/denies             TREATMENT:  Unless otherwise stated, CGA was provided and gait belt donned in order to ensure pt safety   TE:  Octane fitness level 2 for B UE / LE reciprocal movement training and aerobic priming.    NMR  Seated PWR! Moves ( up x 15, rock x 10 ea , twist x 10 ea, step x 10 ea  ) with 1# wrist weights donned   Keeping 1# wrist weights donned:  Standing PWR! Up, twist, step, and rock without hands on back of chair modification x 10 to ea side   Ambulation with 3# AW and SPC x 600 ft, rest then 450 ft   Pt required occasional rest breaks due fatigue, PT was quick to ask when pt appeared to be fatiguing in order to prevent excessive fatigue.  PWR! Up works on postural  strengthening and antigravity extension, PRW! Rock working on [employer] shifting, PRW! Twist targeting trunk rotation and PRW! Step targeting transition movements. PWR! Moves target bradykinesia, rigidity, and dyskinesia through targeted functional movements that address four core movement difficulties for people with Parkinson's disease.     PT Education -     Education Details Pt educated throughout session about proper posture and technique with exercises. Improved exercise technique, movement at target joints, use of target muscles after min to mod verbal, visual, tactile cues.      Person(s) Educated Patient    Methods Explanation;Demonstration ; handout   Comprehension Verbalized understanding;Returned demonstration;Need further instruction           PT Short Term Goals -       PT SHORT TERM GOAL #1   Title Pt to report regular performance of exercises prescribed for home and a sense of improvements in strength and balance AEB  them not being as challenging anymore.    Baseline Will issue on visit 2    Time 4    Period Weeks    Status met   Target Date 03/15/22      PT SHORT TERM GOAL #2   Title Pt to demonstrate improved power AEB improved 5xSTS hands free in <18sec.    Baseline 03/01/22: 22sec hands free, author provideing foot block bilat 03/31/22: 14.82sec 4/23:17.3 sec   Time 4    Period Weeks    Status Met    Target Date 03/29/22               PT Long Term Goals - Target goal date for all remaining long term goals is 07/19/2022        PT LONG TERM GOAL #1   Title Pt to improve score on FOTO survey to 57 to indicate reduced difficulty with basic mobility required for ADL performance.    Baseline 03/01/22:49 04/29/22:56%. 06/16/22: 60  09/08/22:60   Time 8    Period Weeks    Status Met          PT LONG TERM GOAL #2   Title Pt to improve tolerance to overground AMB c SPC AEB performance >853ft.    Baseline 2/27: fatigued after 485ft lap  performed in four minutes ten seconds. 03/31/22: 758ft with SPC no rest break 04/26/22: 794 ft 06/16/22: 903 ft with SPC, no rest.   Time 8    Period Weeks    Status Met         PT LONG TERM GOAL #3   Title Pt to demonstrate improved leg power AEB 5xSTS from chair <14sec hands free and without need for minGuard assist for safety and without need for foot block.    Baseline 03/01/22: 22sec hands free, feet blocked, minGuard assist  03/31/22: 14.82sec  4/23:17.3 sec 06/16/22: 13.87 sec 7/16: 11.55 sec   Time 8     Period Weeks    Status MET         PT LONG TERM GOAL #4   Title Pt to demonstrate improvement in balance performance AEB >6 improvement BERG Balance for balance assessment.    Baseline 41 on 03/03/22. 3/38/24: 42 4/23: 45 06/16/22: 48   Time 8    Period Weeks    Status Met          5.  Pt will improve distance with LRAD to 900 feet or greater in order to indicate further progress to community ambulation distances Baseline: 794 ft with SPC , 06/16/22: 903 ft with SPC. 07/19/22: 905 feet Goal status: Met  6.  Patient will improve modified DGI (with use of cane) score > 4 points or greater in order to indicate improved balance and decreased risk of falls.  Baseline: 6/18: 11 7/16: 14  Goal status: IN PROGRESS  7. Patient will be able to withstand various light perturbations while preforming ambulation with LRAD utilizing safe balance strategies in order to simulate safe and confident walking in crowded places. Baseline: Pt currently does not feel safe preforming task, could walk through cafeteria to get baseline. 7/23: 70% confidence with activity, mild LOB with perturbations.  9/5: able to complete this task on multiple occasions without LOB.   Goal Status:MET   8. Pt will be compliant and independent with Parkinson's based HEP by reciting and demonstrating full PWR! Move sets in seated and or standing with no cueing from therapist. Baseline: Pt currently needs  demonstration  or verbal cueing for proper completion  7/23: cues required for proper completion with PWR! Up, rock, and twist in seated position  Goal Status: IN PROGRESS     Plan     Clinical Impression Statement Patient arrived with good motivation form completion of PT activities.  Pt reports good compliance with HEP. Pt continues with PD specific activities to enhance his movement patterns, balance, and mobility. Pt shows good initiation of movement despite continued difficulty with tremors this date. Pt progressed with standing PWR! Moves without UE support modification. Pt will continue to benefit from skilled physical therapy intervention to address impairments, improve QOL, and attain therapy goals.        Personal Factors and Comorbidities Time since onset of injury/illness/exacerbation;Past/Current Experience    Examination-Activity Limitations Locomotion Level;Transfers;Bed Mobility;Reach Overhead;Hygiene/Grooming;Dressing;Toileting;Stairs;Bend    Stability/Clinical Decision Making Evolving/Moderate complexity    Clinical Decision Making Moderate    Rehab Potential Good    PT Frequency 2x / week    PT Duration 8 weeks    PT Treatment/Interventions ADLs/Self Care Home Management;Moist Heat;Gait training;Stair training;Functional mobility training;Therapeutic activities;Therapeutic exercise;Balance training;Neuromuscular re-education;Patient/family education    PT Next Visit Plan Continue plan    PT Home Exercise Plan 03/01/22: discussed finding and elevated sit surface for future STS exercises (something ~18-19 inches high)    Consulted and Agree with Plan of Care Patient             Patient will benefit from skilled therapeutic intervention in order to improve the following deficits and impairments:  balance, strength, coordination, gait, transfers. Posture. ROM   Visit Diagnosis: Abnormality of gait and mobility  Difficulty in walking, not elsewhere classified  Unsteadiness on  feet  Other abnormalities of gait and mobility    Problem List Patient Active Problem List   Diagnosis Date Noted   Acute respiratory failure due to COVID-19 (HCC) 05/28/2019   Alcohol dependence with uncomplicated withdrawal (HCC) 01/23/2018   Alcohol withdrawal (HCC) 01/23/2018   GIB (gastrointestinal bleeding) 12/09/2017   A-fib (HCC) 12/09/2017   Esophageal obstruction 10/10/2016   Esophageal ulceration 10/10/2016   GI bleed 09/20/2016   Low vitamin B12 level 03/18/2016   S/P kyphoplasty 03/18/2016   Basal cell carcinoma 02/08/2016   Atrial fibrillation (HCC) 01/27/2016   Colitis 01/27/2016   HLD (hyperlipidemia) 01/27/2016   Anxiety 01/27/2016   Hypokalemia 01/27/2016   Major depressive disorder, recurrent episode, moderate (HCC) 04/14/2015   Anxiety, generalized 12/06/2013   Parkinson disease 08/07/2013   Dysphagia 05/12/2013   Esophageal mass 05/12/2013   GERD (gastroesophageal reflux disease) 05/12/2013   Vomiting 05/12/2013   Open bite of lower leg 05/10/2013   Dog bite of lower leg 05/10/2013   Abnormal finding on liver function 03/25/2013   Benign neoplasm 03/25/2013   Dizziness 03/25/2013   Type 2 diabetes mellitus (HCC) 03/25/2013   BP (high blood pressure) 03/25/2013   Awareness of heartbeats 03/25/2013   Pure hypercholesterolemia 03/25/2013   Infective tonsillitis 03/25/2013   Adenomatous polyp 03/25/2013      Norman Herrlich PT ,DPT Physical Therapist- [place]  [facility]

## 2022-09-27 ENCOUNTER — Ambulatory Visit: Payer: Medicare Other

## 2022-09-27 ENCOUNTER — Ambulatory Visit: Payer: Medicare Other | Admitting: Occupational Therapy

## 2022-09-27 DIAGNOSIS — R262 Difficulty in walking, not elsewhere classified: Secondary | ICD-10-CM

## 2022-09-27 DIAGNOSIS — M6281 Muscle weakness (generalized): Secondary | ICD-10-CM

## 2022-09-27 DIAGNOSIS — R2681 Unsteadiness on feet: Secondary | ICD-10-CM

## 2022-09-27 DIAGNOSIS — R269 Unspecified abnormalities of gait and mobility: Secondary | ICD-10-CM

## 2022-09-27 NOTE — Therapy (Signed)
Outpatient Physical Therapy Treatment    Patient Details  Name: Charles Stanton MRN: [id number] Date of Birth: 26-May-1955 Referring Provider (PT): Dr. Cristopher Peru   Encounter Date: 09/27/2022  END OF SESSION:    PT End of Session - 09/27/22 1322     Visit Number 54    Number of Visits 62    Date for PT Re-Evaluation 10/11/22    Authorization Type Medicare A & B primary; federal employee secondary    Progress Note Due on Visit 60    PT Start Time 1315    PT Stop Time 1355    PT Time Calculation (min) 40 min    Equipment Utilized During Treatment Gait belt    Activity Tolerance Patient tolerated treatment well;No increased pain    Behavior During Therapy [facility] for tasks assessed/performed                  Past Medical History:  Diagnosis Date   Abnormal liver function    Anemia    Anxiety    Atrial fibrillation (HCC)    Cancer (HCC) 2017   skin   Colonic mass    Depression    Dizziness    Dry cough    History of colon polyps    Hypercholesteremia    Hyperlipemia    Hypertension    Palpitations    Parkinson's disease    Parkinson's disease    PVD (peripheral vascular disease) (HCC)    Tonsillitis 03/25/2013   Tremors of nervous system     Past Surgical History:  Procedure Laterality Date   BACK SURGERY     Kyphoplasty   March 2018   COLONOSCOPY     COLONOSCOPY WITH PROPOFOL N/A 12/12/2017   Procedure: COLONOSCOPY WITH PROPOFOL;  Surgeon: Wyline Mood, MD;  Location: [facility] ENDOSCOPY;  Service: Gastroenterology;  Laterality: N/A;   ESOPHAGOGASTRODUODENOSCOPY N/A 12/11/2017   Procedure: ESOPHAGOGASTRODUODENOSCOPY (EGD);  Surgeon: Wyline Mood, MD;  Location: [facility] ENDOSCOPY;  Service: Gastroenterology;  Laterality: N/A;   ESOPHAGOGASTRODUODENOSCOPY N/A 10/08/2021   Procedure: ESOPHAGOGASTRODUODENOSCOPY (EGD);  Surgeon: Regis Bill, MD;  Location: [facility] ENDOSCOPY;  Service: Endoscopy;  Laterality: N/A;   ESOPHAGOGASTRODUODENOSCOPY (EGD) WITH PROPOFOL  N/A 09/21/2016   Procedure: ESOPHAGOGASTRODUODENOSCOPY (EGD) WITH PROPOFOL;  Surgeon: Wyline Mood, MD;  Location: [facility] ENDOSCOPY;  Service: Gastroenterology;  Laterality: N/A;   ESOPHAGOGASTRODUODENOSCOPY (EGD) WITH PROPOFOL N/A 11/07/2016   Procedure: ESOPHAGOGASTRODUODENOSCOPY (EGD) WITH PROPOFOL;  Surgeon: Wyline Mood, MD;  Location: [facility] ENDOSCOPY;  Service: Gastroenterology;  Laterality: N/A;   ESOPHAGOGASTRODUODENOSCOPY (EGD) WITH PROPOFOL N/A 10/15/2019   Procedure: ESOPHAGOGASTRODUODENOSCOPY (EGD) WITH PROPOFOL;  Surgeon: Regis Bill, MD;  Location: [facility] ENDOSCOPY;  Service: Endoscopy;  Laterality: N/A;   ESOPHAGOGASTRODUODENOSCOPY (EGD) WITH PROPOFOL N/A 10/28/2019   Procedure: ESOPHAGOGASTRODUODENOSCOPY (EGD) WITH PROPOFOL;  Surgeon: Regis Bill, MD;  Location: [facility] ENDOSCOPY;  Service: Endoscopy;  Laterality: N/A;   ESOPHAGOGASTRODUODENOSCOPY (EGD) WITH PROPOFOL N/A 12/02/2019   Procedure: ESOPHAGOGASTRODUODENOSCOPY (EGD) WITH PROPOFOL;  Surgeon: Regis Bill, MD;  Location: [facility] ENDOSCOPY;  Service: Endoscopy;  Laterality: N/A;   ESOPHAGOGASTRODUODENOSCOPY (EGD) WITH PROPOFOL N/A 02/18/2020   Procedure: ESOPHAGOGASTRODUODENOSCOPY (EGD) WITH PROPOFOL;  Surgeon: Regis Bill, MD;  Location: [facility] ENDOSCOPY;  Service: Endoscopy;  Laterality: N/A;   ESOPHAGOGASTRODUODENOSCOPY (EGD) WITH PROPOFOL N/A 08/02/2021   Procedure: ESOPHAGOGASTRODUODENOSCOPY (EGD) WITH PROPOFOL;  Surgeon: Regis Bill, MD;  Location: [facility] ENDOSCOPY;  Service: Endoscopy;  Laterality: N/A;  REQUEST AM   ESOPHAGOGASTRODUODENOSCOPY (EGD) WITH PROPOFOL N/A 10/05/2021  Procedure: ESOPHAGOGASTRODUODENOSCOPY (EGD) WITH PROPOFOL;  Surgeon: Regis Bill, MD;  Location: [facility] ENDOSCOPY;  Service: Endoscopy;  Laterality: N/A;   FRACTURE SURGERY Right 05/28/2019   distal radius fracture   HERNIA REPAIR     left inguinal hernia repair as an infant   KYPHOPLASTY N/A 02/11/2016   Procedure:  KYPHOPLASTY  L2,T9;  Surgeon: Kennedy Bucker, MD;  Location: [facility] ORS;  Service: Orthopedics;  Laterality: N/A;   KYPHOPLASTY N/A 03/15/2016   Procedure: KYPHOPLASTY L3;  Surgeon: Kennedy Bucker, MD;  Location: [facility] ORS;  Service: Orthopedics;  Laterality: N/A;   OPEN REDUCTION INTERNAL FIXATION (ORIF) DISTAL RADIAL FRACTURE Right 05/28/2019   Procedure: OPEN REDUCTION INTERNAL FIXATION (ORIF) DISTAL RADIAL FRACTURE;  Surgeon: Kennedy Bucker, MD;  Location: [facility] ORS;  Service: Orthopedics;  Laterality: Right;   skin cancer removed     TONSILLECTOMY       Subjective Assessment -      Subjective Pt reports doing well today. No updates. 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     Pertinent History Charles Stanton is a 66yoM who was prescribed with Parkinson's Disease ~ 10 years.  Pt is followed by neurology, reports good reponse to medications which help control his tremors somewhat. Pt uses a SPC for AMB, also uses a walker for longer distance walking. Pt goes into community almost daily (they like to eat out) which includes lots of SPC [place] eand navigation of 4 stairs at home.    Currently in Pain? No/denies            OBJECTIVE:   TREATMENT TODAY 09/27/22  [employer] fitness level 2x5 minutes, seat 3  AMB 517ft around LL c 2.5lb AW (SPC) 4:15sec Seated PWR! Moves (up x 10, rock x 10 ea, twist x 10 ea, transition x 10 ea) with 1# wrist weights donned  AMB 530ft around LL c 2.5lb AW (SPC) Seated PWR! Moves (up x 10, rock x 10 ea, twist x 10 ea, transition x 10 ea) with 1# wrist weights donned      PT Education -     Education Details Pt educated throughout session about proper posture and technique with exercises. Improved exercise technique, movement at target joints, use of target muscles after min to mod verbal, visual, tactile cues.      Person(s) Educated Patient    Methods Explanation;Demonstration ; handout   Comprehension Verbalized understanding;Returned demonstration;Need further instruction            PT Short Term Goals -       PT SHORT TERM GOAL #1   Title Pt to report regular performance of exercises prescribed for home and a sense of improvements in strength and balance AEB them not being as challenging anymore.    Baseline Will issue on visit 2    Time 4    Period Weeks    Status met   Target Date 03/15/22      PT SHORT TERM GOAL #2   Title Pt to demonstrate improved power AEB improved 5xSTS hands free in <18sec.    Baseline 03/01/22: 22sec hands free, author provideing foot block bilat 03/31/22: 14.82sec 4/23:17.3 sec   Time 4    Period Weeks    Status Met    Target Date 03/29/22               PT Long Term Goals -        PT LONG TERM GOAL #1   Title Pt to improve score on FOTO survey  to 57 to indicate reduced difficulty with basic mobility required for ADL performance.    Baseline 03/01/22:49 04/29/22:56%. 06/16/22: 60  09/08/22:60   Time 8    Period Weeks    Status Met          PT LONG TERM GOAL #2   Title Pt to improve tolerance to overground AMB c SPC AEB performance >828ft.    Baseline 2/27: fatigued after 439ft lap performed in four minutes ten seconds. 03/31/22: 787ft with SPC no rest break 04/26/22: 794 ft 06/16/22: 903 ft with SPC, no rest.   Time 8    Period Weeks    Status Met         PT LONG TERM GOAL #3   Title Pt to demonstrate improved leg power AEB 5xSTS from chair <14sec hands free and without need for minGuard assist for safety and without need for foot block.    Baseline 03/01/22: 22sec hands free, feet blocked, minGuard assist  03/31/22: 14.82sec  4/23:17.3 sec 06/16/22: 13.87 sec 7/16: 11.55 sec   Time 8     Period Weeks    Status MET         PT LONG TERM GOAL #4   Title Pt to demonstrate improvement in balance performance AEB >6 improvement BERG Balance for balance assessment.    Baseline 41 on 03/03/22. 3/38/24: 42 4/23: 45 06/16/22: 48   Time 8    Period Weeks    Status Met          5.  Pt will improve distance  with LRAD to 900 feet or greater in order to indicate further progress to community ambulation distances Baseline: 794 ft with SPC , 06/16/22: 903 ft with SPC. 07/19/22: 905 feet Goal status: Met  6.  Patient will improve modified DGI (with use of cane) score > 4 points or greater in order to indicate improved balance and decreased risk of falls.  Baseline: 6/18: 11 7/16: 14  Goal status: IN PROGRESS  7. Patient will be able to withstand various light perturbations while preforming ambulation with LRAD utilizing safe balance strategies in order to simulate safe and confident walking in crowded places. Baseline: Pt currently does not feel safe preforming task, could walk through cafeteria to get baseline. 7/23: 70% confidence with activity, mild LOB with perturbations.  9/5: able to complete this task on multiple occasions without LOB.   Goal Status:MET   8. Pt will be compliant and independent with Parkinson's based HEP by reciting and demonstrating full PWR! Move sets in seated and or standing with no cueing from therapist. Baseline: Pt currently needs demonstration or verbal cueing for proper completion  7/23: cues required for proper completion with PWR! Up, rock, and twist in seated position  Goal Status: IN PROGRESS     Plan     Clinical Impression Statement Patient arrived with good motivation form completion of PT activities.  Pt reports good compliance with HEP. Pt continues with PD specific activities to enhance his movement patterns, balance, and mobility. Still working on achievement of remaining LT goals. Pt will continue to benefit from skilled physical therapy intervention to address impairments, improve QOL, and attain therapy goals.    Personal Factors and Comorbidities Time since onset of injury/illness/exacerbation;Past/Current Experience    Examination-Activity Limitations Locomotion Level;Transfers;Bed Mobility;Reach Overhead;Hygiene/Grooming;Dressing;Toileting;Stairs;Bend     Stability/Clinical Decision Making Evolving/Moderate complexity    Clinical Decision Making Moderate    Rehab Potential Good    PT Frequency 2x /  week    PT Duration 8 weeks    PT Treatment/Interventions ADLs/Self Care Home Management;Moist Heat;Gait training;Stair training;Functional mobility training;Therapeutic activities;Therapeutic exercise;Balance training;Neuromuscular re-education;Patient/family education    PT Next Visit Plan Continue plan    PT Home Exercise Plan 03/01/22: discussed finding and elevated sit surface for future STS exercises (something ~18-19 inches high)    Consulted and Agree with Plan of Care Patient             Patient will benefit from skilled therapeutic intervention in order to improve the following deficits and impairments:  balance, strength, coordination, gait, transfers. Posture. ROM   Visit Diagnosis: Muscle weakness (generalized)  Abnormality of gait and mobility  Difficulty in walking, not elsewhere classified  Unsteadiness on feet    Problem List Patient Active Problem List   Diagnosis Date Noted   Acute respiratory failure due to COVID-19 (HCC) 05/28/2019   Alcohol dependence with uncomplicated withdrawal (HCC) 01/23/2018   Alcohol withdrawal (HCC) 01/23/2018   GIB (gastrointestinal bleeding) 12/09/2017   A-fib (HCC) 12/09/2017   Esophageal obstruction 10/10/2016   Esophageal ulceration 10/10/2016   GI bleed 09/20/2016   Low vitamin B12 level 03/18/2016   S/P kyphoplasty 03/18/2016   Basal cell carcinoma 02/08/2016   Atrial fibrillation (HCC) 01/27/2016   Colitis 01/27/2016   HLD (hyperlipidemia) 01/27/2016   Anxiety 01/27/2016   Hypokalemia 01/27/2016   Major depressive disorder, recurrent episode, moderate (HCC) 04/14/2015   Anxiety, generalized 12/06/2013   Parkinson disease 08/07/2013   Dysphagia 05/12/2013   Esophageal mass 05/12/2013   GERD (gastroesophageal reflux disease) 05/12/2013   Vomiting 05/12/2013   Open  bite of lower leg 05/10/2013   Dog bite of lower leg 05/10/2013   Abnormal finding on liver function 03/25/2013   Benign neoplasm 03/25/2013   Dizziness 03/25/2013   Type 2 diabetes mellitus (HCC) 03/25/2013   BP (high blood pressure) 03/25/2013   Awareness of heartbeats 03/25/2013   Pure hypercholesterolemia 03/25/2013   Infective tonsillitis 03/25/2013   Adenomatous polyp 03/25/2013    1:25 PM, 09/27/22 Rosamaria Lints, PT, DPT Physical Therapist - [facility]  Outpatient Physical Therapy- Main Campus [phone]     Rosamaria Lints PT ,DPT Physical Therapist- [place]  [facility]

## 2022-09-27 NOTE — Therapy (Signed)
Occupational Therapy Neuro Treatment Note   Patient Name: Charles Stanton MRN: [id number] DOB:05/27/1955, 67 y.o., male Today]'s Date: 09/27/2022  PCP: Dr. Barbette Reichmann REFERRING PROVIDER: Dr. Cristopher Peru  END OF SESSION:  OT End of Session - 09/27/22 1835     Visit Number 28    Number of Visits 48    Date for OT Re-Evaluation 11/15/22    OT Start Time 1400    OT Stop Time 1445    OT Time Calculation (min) 45 min    Activity Tolerance Patient tolerated treatment well    Behavior During Therapy [facility] for tasks assessed/performed             Past Medical History:  Diagnosis Date   Abnormal liver function    Anemia    Anxiety    Atrial fibrillation (HCC)    Cancer (HCC) 2017   skin   Colonic mass    Depression    Dizziness    Dry cough    History of colon polyps    Hypercholesteremia    Hyperlipemia    Hypertension    Palpitations    Parkinson's disease    Parkinson's disease    PVD (peripheral vascular disease) (HCC)    Tonsillitis 03/25/2013   Tremors of nervous system    Past Surgical History:  Procedure Laterality Date   BACK SURGERY     Kyphoplasty   March 2018   COLONOSCOPY     COLONOSCOPY WITH PROPOFOL N/A 12/12/2017   Procedure: COLONOSCOPY WITH PROPOFOL;  Surgeon: Wyline Mood, MD;  Location: [facility] ENDOSCOPY;  Service: Gastroenterology;  Laterality: N/A;   ESOPHAGOGASTRODUODENOSCOPY N/A 12/11/2017   Procedure: ESOPHAGOGASTRODUODENOSCOPY (EGD);  Surgeon: Wyline Mood, MD;  Location: [facility] ENDOSCOPY;  Service: Gastroenterology;  Laterality: N/A;   ESOPHAGOGASTRODUODENOSCOPY N/A 10/08/2021   Procedure: ESOPHAGOGASTRODUODENOSCOPY (EGD);  Surgeon: Regis Bill, MD;  Location: [facility] ENDOSCOPY;  Service: Endoscopy;  Laterality: N/A;   ESOPHAGOGASTRODUODENOSCOPY (EGD) WITH PROPOFOL N/A 09/21/2016   Procedure: ESOPHAGOGASTRODUODENOSCOPY (EGD) WITH PROPOFOL;  Surgeon: Wyline Mood, MD;  Location: [facility] ENDOSCOPY;  Service: Gastroenterology;  Laterality: N/A;    ESOPHAGOGASTRODUODENOSCOPY (EGD) WITH PROPOFOL N/A 11/07/2016   Procedure: ESOPHAGOGASTRODUODENOSCOPY (EGD) WITH PROPOFOL;  Surgeon: Wyline Mood, MD;  Location: [facility] ENDOSCOPY;  Service: Gastroenterology;  Laterality: N/A;   ESOPHAGOGASTRODUODENOSCOPY (EGD) WITH PROPOFOL N/A 10/15/2019   Procedure: ESOPHAGOGASTRODUODENOSCOPY (EGD) WITH PROPOFOL;  Surgeon: Regis Bill, MD;  Location: [facility] ENDOSCOPY;  Service: Endoscopy;  Laterality: N/A;   ESOPHAGOGASTRODUODENOSCOPY (EGD) WITH PROPOFOL N/A 10/28/2019   Procedure: ESOPHAGOGASTRODUODENOSCOPY (EGD) WITH PROPOFOL;  Surgeon: Regis Bill, MD;  Location: [facility] ENDOSCOPY;  Service: Endoscopy;  Laterality: N/A;   ESOPHAGOGASTRODUODENOSCOPY (EGD) WITH PROPOFOL N/A 12/02/2019   Procedure: ESOPHAGOGASTRODUODENOSCOPY (EGD) WITH PROPOFOL;  Surgeon: Regis Bill, MD;  Location: [facility] ENDOSCOPY;  Service: Endoscopy;  Laterality: N/A;   ESOPHAGOGASTRODUODENOSCOPY (EGD) WITH PROPOFOL N/A 02/18/2020   Procedure: ESOPHAGOGASTRODUODENOSCOPY (EGD) WITH PROPOFOL;  Surgeon: Regis Bill, MD;  Location: [facility] ENDOSCOPY;  Service: Endoscopy;  Laterality: N/A;   ESOPHAGOGASTRODUODENOSCOPY (EGD) WITH PROPOFOL N/A 08/02/2021   Procedure: ESOPHAGOGASTRODUODENOSCOPY (EGD) WITH PROPOFOL;  Surgeon: Regis Bill, MD;  Location: [facility] ENDOSCOPY;  Service: Endoscopy;  Laterality: N/A;  REQUEST AM   ESOPHAGOGASTRODUODENOSCOPY (EGD) WITH PROPOFOL N/A 10/05/2021   Procedure: ESOPHAGOGASTRODUODENOSCOPY (EGD) WITH PROPOFOL;  Surgeon: Regis Bill, MD;  Location: [facility] ENDOSCOPY;  Service: Endoscopy;  Laterality: N/A;   FRACTURE SURGERY Right 05/28/2019   distal radius fracture   HERNIA REPAIR     left inguinal  hernia repair as an infant   KYPHOPLASTY N/A 02/11/2016   Procedure: KYPHOPLASTY  L2,T9;  Surgeon: Kennedy Bucker, MD;  Location: [facility] ORS;  Service: Orthopedics;  Laterality: N/A;   KYPHOPLASTY N/A 03/15/2016   Procedure: KYPHOPLASTY L3;  Surgeon:  Kennedy Bucker, MD;  Location: [facility] ORS;  Service: Orthopedics;  Laterality: N/A;   OPEN REDUCTION INTERNAL FIXATION (ORIF) DISTAL RADIAL FRACTURE Right 05/28/2019   Procedure: OPEN REDUCTION INTERNAL FIXATION (ORIF) DISTAL RADIAL FRACTURE;  Surgeon: Kennedy Bucker, MD;  Location: [facility] ORS;  Service: Orthopedics;  Laterality: Right;   skin cancer removed     TONSILLECTOMY     Patient Active Problem List   Diagnosis Date Noted   Acute respiratory failure due to COVID-19 (HCC) 05/28/2019   Alcohol dependence with uncomplicated withdrawal (HCC) 01/23/2018   Alcohol withdrawal (HCC) 01/23/2018   GIB (gastrointestinal bleeding) 12/09/2017   A-fib (HCC) 12/09/2017   Esophageal obstruction 10/10/2016   Esophageal ulceration 10/10/2016   GI bleed 09/20/2016   Low vitamin B12 level 03/18/2016   S/P kyphoplasty 03/18/2016   Basal cell carcinoma 02/08/2016   Atrial fibrillation (HCC) 01/27/2016   Colitis 01/27/2016   HLD (hyperlipidemia) 01/27/2016   Anxiety 01/27/2016   Hypokalemia 01/27/2016   Major depressive disorder, recurrent episode, moderate (HCC) 04/14/2015   Anxiety, generalized 12/06/2013   Parkinson disease 08/07/2013   Dysphagia 05/12/2013   Esophageal mass 05/12/2013   GERD (gastroesophageal reflux disease) 05/12/2013   Vomiting 05/12/2013   Open bite of lower leg 05/10/2013   Dog bite of lower leg 05/10/2013   Abnormal finding on liver function 03/25/2013   Benign neoplasm 03/25/2013   Dizziness 03/25/2013   Type 2 diabetes mellitus (HCC) 03/25/2013   BP (high blood pressure) 03/25/2013   Awareness of heartbeats 03/25/2013   Pure hypercholesterolemia 03/25/2013   Infective tonsillitis 03/25/2013   Adenomatous polyp 03/25/2013   ONSET DATE: 10 years   REFERRING DIAG: Parkinson's Disease  THERAPY DIAG:  Muscle weakness (generalized)  Rationale for Evaluation and Treatment: Rehabilitation  SUBJECTIVE:  SUBJECTIVE STATEMENT:  Pt. reports that his 40th wedding  anniversary is coming up this weekend.  Pt accompanied by: self; spouse present in lobby, but did not follow for eval  PERTINENT HISTORY: Pt has been working with outpatient PT here at [facility] since end of Feb 2024.  OT ordered d/t worsening PD symptoms contributing to functional decline with ADLs.   PRECAUTIONS: Fall  WEIGHT BEARING RESTRICTIONS: No  PAIN:  Are you having pain? No pain reported today.  FALLS: Has patient fallen in last 6 months? Yes. Number of falls 1  LIVING ENVIRONMENT: Lives with: lives with their spouse Lives in: split level but pt remains on main level to avoid steps Stairs: Yes: Internal: 2 steps; can reach both Has following equipment at home: Quad cane small base, Walker - 4 wheeled, bed side commode, Grab bars, and pull down seat for shower, hand held shower hose,   PLOF: Needs assistance with ADLs; pt reports that spouse assists but is consistent to always let pt try things first before she automatically helps.    PATIENT GOALS: To be more indep  OBJECTIVE:  HAND DOMINANCE: Right (most affected side from PD)  ADLs: Overall ADLs: spouse is primary caregiver Transfers/ambulation related to ADLs: uses RW Eating: spouse cuts food; pt reports having to use L non-dominant hand to eat for the last 5-6 yrs Grooming: set up; assist to squeeze toothpaste and denture paste; uses L non-dominant hand UB Dressing: set up; assist  with clothing fasteners LB Dressing: Min A; assist with socks, dons slip on shoes with set up (has tried a sockaid but was unsuccessful) Toileting: modified indep Bathing: Set up/min A; spouse squeezes shampoo into pt's hands, spouse helps to towel dry  Tub Shower transfers: distant supv Equipment:  see above   IADLs: Shopping: pt can accompany spouse by pushing a shopping cart.  Light housekeeping: pt can drag trash to bin and can roll the bin to the road and back Meal Prep: spouse manages  Community mobility: spouse drives; uses  rollator for longer distances  Medication management: spouse manages pills using weekly pill organizer.   Financial management: spouse manages  Handwriting: Mild micrographia and <25% legible  MOBILITY STATUS: Hx of falls  POSTURE COMMENTS:  rounded shoulders and forward head, R shoulder elevated  ACTIVITY TOLERANCE: Activity tolerance: TBD and assessed further with functional activities   FUNCTIONAL OUTCOME MEASURES: FOTO: TBD   UPPER EXTREMITY ROM:    Active ROM Right eval Left eval Right 07/12/2022 Left 07/12/2022 Right 08/28/2022 Left  08/28/2022  Shoulder flexion 118 85 120 117 124 120  Shoulder abduction 128 124 132 127 132 130  Shoulder adduction        Shoulder extension        Shoulder internal rotation [facility] WFL  Shoulder external rotation [facility] WFL  Elbow flexion        Elbow extension        Wrist flexion 55 76 59 75 59 75  Wrist extension 57 75 61 71 61 71  Wrist ulnar deviation        Wrist radial deviation        Wrist pronation [facility] WFL  Wrist supination 63 75 66 81 55 81  (Blank rows = not tested)  UPPER EXTREMITY MMT:     MMT Right eval Left eval Right 07/12/2022 Left 07/12/2022 Right 08/23/2022 Left 08/23/2022  Shoulder flexion 4 4 4+ 4+ 4+ 4+  Shoulder abduction 4+ 4+ 4+ 4+ 4+ 4+  Shoulder adduction        Shoulder extension        Shoulder internal rotation        Shoulder external rotation        Middle trapezius        Lower trapezius        Elbow flexion 4+ 4+ 4+ 4+ 5 5  Elbow extension 4+ 4+ 4+ 4+ 4+ 4+  Wrist flexion 4+ 4+ 4+ 4+ 4+ 4+  Wrist extension 4+ 4+ 4+ 4+ 4+ 4+  Wrist ulnar deviation        Wrist radial deviation        Wrist pronation        Wrist supination        (Blank rows = not tested)  HAND FUNCTION: Grip strength: Right: 16 lbs; Left: 38 lbs, Lateral pinch: Right: 10 lbs, Left: 14 lbs, and 3 point pinch: Right: 9 lbs, Left: 11 lbs 07/12/2022: Grip strength: Right: 16 lbs; Left:  40 lbs, Lateral pinch: Right: 10 lbs, Left: 14 lbs, and 3 point pinch: Right: 9 lbs, Left: 15 lbs  08/23/2022: Grip strength: Right: 7 lbs; Left: 30 lbs, Lateral pinch: Right: 10 lbs, Left: 14 lbs, and 3 point pinch: Right: 6 lbs, Left: 14 lbs COORDINATION: 9 Hole Peg test: Right: 38 sec; Left: 40 sec 07/12/2022: 9 Hole Peg test: Right: 35 sec; Left: 36 sec  08/23/2022: 9 Hole Peg test: Right: 51 sec; Left: 39 sec SENSATION: WFL  EDEMA: None  MUSCLE TONE: R/L normal  COGNITION: Overall cognitive status: Within functional limits for tasks assessed  VISION: Subjective report: wears glasses all the time    PRAXIS: Impaired: Motor planning  OBSERVATIONS:  R SF PIP flexion contracture 105* from 5-6 years ago from a fall  TODAY'S TREATMENT:                                                                                                                              DATE: 09/22/2022:  There. Ex.:  Pt. worked on [employer], and reciprocal motion using the UBE while seated for 8 min. with no resistance. constant monitoring was provided. Pt. performed bilateral gross gripping with a gross grip strengthener. Pt. worked on sustaining grip while grasping pegs and reaching at various heights. The Gripper was set to 6.6-11.2# of grip strength resistance on the right, and 17.9# on the left.  Pt. worked on using bilateral UE's to grasp large flat shapes, and reach up to move them through 4 vertical dowels of progressively increasing heights. Pt. Utilized a 2# wrist cuff weight in place while reaching. Pt. Worked on the DigiFlex 1.5# with the bilateral hands for digit flexion.               PATIENT EDUCATION: Education details:  UE strengthening Person educated: Patient Education method: Explanation and Verbal cues, demo Education comprehension: verbalized understanding, demonstrated understanding  HOME EXERCISE PROGRAM: Theraputty   GOALS: Goals reviewed with patient? Yes  SHORT TERM  GOALS: Target date: 10/04/2022   (6 weeks)  Pt will be indep to perform HEP for improving bilat hand strength and coordination skills. Baseline: Eval: not yet initiated 07/12/2022: Completing theraputty exercises independent at home Goal status: Met/Achieved  LONG TERM GOALS: Target date: 11/15/2022 (12 weeks)  Pt will increase FOTO score to (TBD) or better to indicate improvement in self perceived functional use of the R arm with daily tasks. Baseline: Eval: TBD 07/12/2022: TBD Goal status: Discontinued  2.  Pt will increase R grip strength by 5 or more lbs to more easily hold and stabilize ADL supplies in the R dominant hand. Baseline: 08/25/2022: Grip strength: Right: 7 lbs; Left: 30 lbs 08/23/2022:  Grip strength: Right: 7 lbs; Left: 30 lbs R grip 16 lbs; limited from PD but also by 5th digit PIP flexion contracture, as this digit can not engage in gripping (L 38 lbs) 07/12/2022: Grip strength: Right: 16 lbs; limited from PD but also by 5th digit PIP flexion contracture, as this digit still can not engage in gripping (L 40 lbs) Goal status: Ongoing  3.  Pt will improve R hand FMC/dexterity skills to be able to sign his name on medical/legal documents with 75% legibility, using adapted writing aids as needed. Baseline: 08/25/2022: Improving writing, however this is dependent on whether or not he is have a bad day with  tremors 08/23/2022: Improving writing, however this is dependent on whether or not he is have a bad day with tremors. Eval: <25% legible and pt verbalizes embarrassment when attempting to sign his name on forms 07/12/2022: 75% legibility when writing his name in print, however reports he is still not satisfied with his signature looks. Goal status: Ongoing  4.  Pt will increase R FMC/GMC skills to engage the RUE into UB ADLs at least 50% of the time. Baseline:  08/25/2022: Pt. Is trying to engage his right UE more during daily ADL, and IADL tasks. Eval: Pt uses the L non-dominant arm for  self care tasks. 08/23/2022: Pt. Is trying to engage his right UE more during daily ADL, and IADL tasks. Eval: Pt uses the L non-dominant arm for self care tasks. 07/12/22: Pt. Reports that he has been trying to incorporate his R hand while still using the L arm to complete all self care tasks.  Goal status: Ongoing  5. Pt. Will improve typing skills  by 1 min.  In preparation for typing an email correspondence  Baseline:  08/25/2022: Pt. Typed I sentence in 1 min. & 27 sec. With the left hand. Pt. Typed 1 sentence in 1 min. & 40 sec. With bilateral hands. Limited typing engagement with the right hand.  08/23/2022: Pt. Typed I sentence in 1 min. & 27 sec. With the left hand. Pt. Typed 1 sentence in 1 min. & 40 sec. With bilateral hands. Limited typing engagement with the right hand.    Goal status: New   ASSESSMENT:  CLINICAL IMPRESSION:  Pt. continues tolerate the UE strengthening, grip, and pinch strengthening well today. Pt. was able to tolerate the 2# wrist cuff weight in place while reaching without support proximally. Pt. requires rest breaks during the reaching tasks. Pt. requires cues for hand position on the DigiFlex. Pt. Tolerated the gross grip strengthening well today. Pt. requires cues for visual demonstration of proper technique with each exercise. Pt. continues to require rest breaks between each exercise. Pt. presented with less tremors today with more motor control, and coordination during ADLs/IADLs, and Pt. continues to benefit from strategies for proximal stabilization during tasks for more distal control. Pt. continues to benefit from skilled OT for increasing bilateral hand strength, improving coordination, specifically working to increase engagement of the RUE during self care tasks.    PERFORMANCE DEFICITS: in functional skills including ADLs, IADLs, coordination, dexterity, ROM, strength, flexibility, Fine motor control, Gross motor control, mobility, balance, body mechanics,  endurance, decreased knowledge of use of DME, and UE functional use, cognitive skills including emotional and memory, and psychosocial skills including coping strategies, environmental adaptation, habits, and routines and behaviors.   IMPAIRMENTS: are limiting patient from ADLs, IADLs, and leisure.   CO-MORBIDITIES: has co-morbidities such as anxiety, depression  that affects occupational performance. Patient will benefit from skilled OT to address above impairments and improve overall function.  MODIFICATION OR ASSISTANCE TO COMPLETE EVALUATION: No modification of tasks or assist necessary to complete an evaluation.  OT OCCUPATIONAL PROFILE AND HISTORY: Problem focused assessment: Including review of records relating to presenting problem.  CLINICAL DECISION MAKING: Moderate - several treatment options, min-mod task modification necessary  REHAB POTENTIAL: Good for goals  EVALUATION COMPLEXITY: Moderate    PLAN:  OT FREQUENCY: 2x/week  OT DURATION: 12 weeks  PLANNED INTERVENTIONS: self care/ADL training, therapeutic exercise, therapeutic activity, neuromuscular re-education, manual therapy, passive range of motion, balance training, functional mobility training, moist heat, cryotherapy, patient/family education, cognitive remediation/compensation,  psychosocial skills training, energy conservation, coping strategies training, and DME and/or AE instructions RECOMMENDED OTHER SERVICES: None at this time  CONSULTED AND AGREED WITH PLAN OF CARE: Patient  PLAN FOR NEXT SESSION: handwriting  Olegario Messier, M.S. OTR/L  09/27/22, 6:37 PM

## 2022-09-29 ENCOUNTER — Ambulatory Visit: Payer: Medicare Other | Admitting: Occupational Therapy

## 2022-09-29 ENCOUNTER — Ambulatory Visit: Payer: Medicare Other | Admitting: Physical Therapy

## 2022-09-29 ENCOUNTER — Encounter: Payer: Self-pay | Admitting: Physical Therapy

## 2022-09-29 DIAGNOSIS — R269 Unspecified abnormalities of gait and mobility: Secondary | ICD-10-CM

## 2022-09-29 DIAGNOSIS — M6281 Muscle weakness (generalized): Secondary | ICD-10-CM | POA: Diagnosis not present

## 2022-09-29 DIAGNOSIS — R262 Difficulty in walking, not elsewhere classified: Secondary | ICD-10-CM

## 2022-09-29 DIAGNOSIS — R2689 Other abnormalities of gait and mobility: Secondary | ICD-10-CM

## 2022-09-29 DIAGNOSIS — R2681 Unsteadiness on feet: Secondary | ICD-10-CM

## 2022-09-29 NOTE — Therapy (Signed)
Outpatient Physical Therapy Treatment    Patient Details  Name: Charles Stanton MRN: [id number] Date of Birth: 25-Aug-1955 Referring Provider (PT): Dr. Cristopher Peru   Encounter Date: 09/29/2022  END OF SESSION:    PT End of Session - 09/29/22 1319     Visit Number 55    Number of Visits 62    Date for PT Re-Evaluation 10/11/22    Authorization Type Medicare A & B primary; federal employee secondary    Progress Note Due on Visit 60    PT Start Time 1316    PT Stop Time 1359    PT Time Calculation (min) 43 min    Equipment Utilized During Treatment Gait belt    Activity Tolerance Patient tolerated treatment well;No increased pain    Behavior During Therapy [facility] for tasks assessed/performed                   Past Medical History:  Diagnosis Date   Abnormal liver function    Anemia    Anxiety    Atrial fibrillation (HCC)    Cancer (HCC) 2017   skin   Colonic mass    Depression    Dizziness    Dry cough    History of colon polyps    Hypercholesteremia    Hyperlipemia    Hypertension    Palpitations    Parkinson's disease    Parkinson's disease    PVD (peripheral vascular disease) (HCC)    Tonsillitis 03/25/2013   Tremors of nervous system     Past Surgical History:  Procedure Laterality Date   BACK SURGERY     Kyphoplasty   March 2018   COLONOSCOPY     COLONOSCOPY WITH PROPOFOL N/A 12/12/2017   Procedure: COLONOSCOPY WITH PROPOFOL;  Surgeon: Wyline Mood, MD;  Location: [facility] ENDOSCOPY;  Service: Gastroenterology;  Laterality: N/A;   ESOPHAGOGASTRODUODENOSCOPY N/A 12/11/2017   Procedure: ESOPHAGOGASTRODUODENOSCOPY (EGD);  Surgeon: Wyline Mood, MD;  Location: [facility] ENDOSCOPY;  Service: Gastroenterology;  Laterality: N/A;   ESOPHAGOGASTRODUODENOSCOPY N/A 10/08/2021   Procedure: ESOPHAGOGASTRODUODENOSCOPY (EGD);  Surgeon: Regis Bill, MD;  Location: [facility] ENDOSCOPY;  Service: Endoscopy;  Laterality: N/A;   ESOPHAGOGASTRODUODENOSCOPY (EGD) WITH PROPOFOL  N/A 09/21/2016   Procedure: ESOPHAGOGASTRODUODENOSCOPY (EGD) WITH PROPOFOL;  Surgeon: Wyline Mood, MD;  Location: [facility] ENDOSCOPY;  Service: Gastroenterology;  Laterality: N/A;   ESOPHAGOGASTRODUODENOSCOPY (EGD) WITH PROPOFOL N/A 11/07/2016   Procedure: ESOPHAGOGASTRODUODENOSCOPY (EGD) WITH PROPOFOL;  Surgeon: Wyline Mood, MD;  Location: [facility] ENDOSCOPY;  Service: Gastroenterology;  Laterality: N/A;   ESOPHAGOGASTRODUODENOSCOPY (EGD) WITH PROPOFOL N/A 10/15/2019   Procedure: ESOPHAGOGASTRODUODENOSCOPY (EGD) WITH PROPOFOL;  Surgeon: Regis Bill, MD;  Location: [facility] ENDOSCOPY;  Service: Endoscopy;  Laterality: N/A;   ESOPHAGOGASTRODUODENOSCOPY (EGD) WITH PROPOFOL N/A 10/28/2019   Procedure: ESOPHAGOGASTRODUODENOSCOPY (EGD) WITH PROPOFOL;  Surgeon: Regis Bill, MD;  Location: [facility] ENDOSCOPY;  Service: Endoscopy;  Laterality: N/A;   ESOPHAGOGASTRODUODENOSCOPY (EGD) WITH PROPOFOL N/A 12/02/2019   Procedure: ESOPHAGOGASTRODUODENOSCOPY (EGD) WITH PROPOFOL;  Surgeon: Regis Bill, MD;  Location: [facility] ENDOSCOPY;  Service: Endoscopy;  Laterality: N/A;   ESOPHAGOGASTRODUODENOSCOPY (EGD) WITH PROPOFOL N/A 02/18/2020   Procedure: ESOPHAGOGASTRODUODENOSCOPY (EGD) WITH PROPOFOL;  Surgeon: Regis Bill, MD;  Location: [facility] ENDOSCOPY;  Service: Endoscopy;  Laterality: N/A;   ESOPHAGOGASTRODUODENOSCOPY (EGD) WITH PROPOFOL N/A 08/02/2021   Procedure: ESOPHAGOGASTRODUODENOSCOPY (EGD) WITH PROPOFOL;  Surgeon: Regis Bill, MD;  Location: [facility] ENDOSCOPY;  Service: Endoscopy;  Laterality: N/A;  REQUEST AM   ESOPHAGOGASTRODUODENOSCOPY (EGD) WITH PROPOFOL N/A 10/05/2021  Procedure: ESOPHAGOGASTRODUODENOSCOPY (EGD) WITH PROPOFOL;  Surgeon: Regis Bill, MD;  Location: [facility] ENDOSCOPY;  Service: Endoscopy;  Laterality: N/A;   FRACTURE SURGERY Right 05/28/2019   distal radius fracture   HERNIA REPAIR     left inguinal hernia repair as an infant   KYPHOPLASTY N/A 02/11/2016   Procedure:  KYPHOPLASTY  L2,T9;  Surgeon: Kennedy Bucker, MD;  Location: [facility] ORS;  Service: Orthopedics;  Laterality: N/A;   KYPHOPLASTY N/A 03/15/2016   Procedure: KYPHOPLASTY L3;  Surgeon: Kennedy Bucker, MD;  Location: [facility] ORS;  Service: Orthopedics;  Laterality: N/A;   OPEN REDUCTION INTERNAL FIXATION (ORIF) DISTAL RADIAL FRACTURE Right 05/28/2019   Procedure: OPEN REDUCTION INTERNAL FIXATION (ORIF) DISTAL RADIAL FRACTURE;  Surgeon: Kennedy Bucker, MD;  Location: [facility] ORS;  Service: Orthopedics;  Laterality: Right;   skin cancer removed     TONSILLECTOMY       Subjective Assessment -      Subjective Pt reports doing well today. No updates. Started rock steady boxing this week and liked it overall.     Pertinent History Charles Stanton is a 66yoM who was prescribed with Parkinson's Disease ~ 10 years.  Pt is followed by neurology, reports good reponse to medications which help control his tremors somewhat. Pt uses a SPC for AMB, also uses a walker for longer distance walking. Pt goes into community almost daily (they like to eat out) which includes lots of SPC [place] eand navigation of 4 stairs at home.    Currently in Pain? No/denies            OBJECTIVE:   TREATMENT TODAY 09/29/22 NMR Octane fitness level 2x6 minutes, seat 3  Seated PWR! Moves (up x 10, rock x 10 ea, twist x 10 ea, transition x 10 ea) with 1# wrist weights donned used PWR! Step to STS flow AMB 621ft around LL c 2.5lb AW (SPC)  REST AMB 550 ft with SPC and with 2.5# AW  standing PWR! Moves (up x 10, rock x 10 ea, twist x 10 ea, transition x 10 ea)      PT Education -     Education Details Pt educated throughout session about proper posture and technique with exercises. Improved exercise technique, movement at target joints, use of target muscles after min to mod verbal, visual, tactile cues.      Person(s) Educated Patient    Methods Explanation;Demonstration ; handout   Comprehension Verbalized understanding;Returned  demonstration;Need further instruction           PT Short Term Goals -       PT SHORT TERM GOAL #1   Title Pt to report regular performance of exercises prescribed for home and a sense of improvements in strength and balance AEB them not being as challenging anymore.    Baseline Will issue on visit 2    Time 4    Period Weeks    Status met   Target Date 03/15/22      PT SHORT TERM GOAL #2   Title Pt to demonstrate improved power AEB improved 5xSTS hands free in <18sec.    Baseline 03/01/22: 22sec hands free, author provideing foot block bilat 03/31/22: 14.82sec 4/23:17.3 sec   Time 4    Period Weeks    Status Met    Target Date 03/29/22               PT Long Term Goals -        PT LONG TERM GOAL #1  Title Pt to improve score on FOTO survey to 57 to indicate reduced difficulty with basic mobility required for ADL performance.    Baseline 03/01/22:49 04/29/22:56%. 06/16/22: 60  09/08/22:60   Time 8    Period Weeks    Status Met          PT LONG TERM GOAL #2   Title Pt to improve tolerance to overground AMB c SPC AEB performance >850ft.    Baseline 2/27: fatigued after 411ft lap performed in four minutes ten seconds. 03/31/22: 782ft with SPC no rest break 04/26/22: 794 ft 06/16/22: 903 ft with SPC, no rest.   Time 8    Period Weeks    Status Met         PT LONG TERM GOAL #3   Title Pt to demonstrate improved leg power AEB 5xSTS from chair <14sec hands free and without need for minGuard assist for safety and without need for foot block.    Baseline 03/01/22: 22sec hands free, feet blocked, minGuard assist  03/31/22: 14.82sec  4/23:17.3 sec 06/16/22: 13.87 sec 7/16: 11.55 sec   Time 8     Period Weeks    Status MET         PT LONG TERM GOAL #4   Title Pt to demonstrate improvement in balance performance AEB >6 improvement BERG Balance for balance assessment.    Baseline 41 on 03/03/22. 3/38/24: 42 4/23: 45 06/16/22: 48   Time 8    Period Weeks    Status Met           5.  Pt will improve distance with LRAD to 900 feet or greater in order to indicate further progress to community ambulation distances Baseline: 794 ft with SPC , 06/16/22: 903 ft with SPC. 07/19/22: 905 feet Goal status: Met  6.  Patient will improve modified DGI (with use of cane) score > 4 points or greater in order to indicate improved balance and decreased risk of falls.  Baseline: 6/18: 11 7/16: 14  Goal status: IN PROGRESS  7. Patient will be able to withstand various light perturbations while preforming ambulation with LRAD utilizing safe balance strategies in order to simulate safe and confident walking in crowded places. Baseline: Pt currently does not feel safe preforming task, could walk through cafeteria to get baseline. 7/23: 70% confidence with activity, mild LOB with perturbations.  9/5: able to complete this task on multiple occasions without LOB.   Goal Status:MET   8. Pt will be compliant and independent with Parkinson's based HEP by reciting and demonstrating full PWR! Move sets in seated and or standing with no cueing from therapist. Baseline: Pt currently needs demonstration or verbal cueing for proper completion  7/23: cues required for proper completion with PWR! Up, rock, and twist in seated position  Goal Status: IN PROGRESS     Plan     Clinical Impression Statement Patient arrived with good motivation form completion of PT activities.  Patient started rock steady boxing class for continued maintenance of his current progress as well as to facilitate further progress with Parkinson's disease specific impairments.  Patient also enjoyed that there is a support group aspect of it.  Patient continues to progress with standing Parkinson's wellness recovery moves this date.  Pt will continue to benefit from skilled physical therapy intervention to address impairments, improve QOL, and attain therapy goals.    Personal Factors and Comorbidities Time since onset  of injury/illness/exacerbation;Past/Current Experience    Examination-Activity Limitations  Locomotion Level;Transfers;Bed Mobility;Reach Overhead;Hygiene/Grooming;Dressing;Toileting;Stairs;Bend    Stability/Clinical Decision Making Evolving/Moderate complexity    Clinical Decision Making Moderate    Rehab Potential Good    PT Frequency 2x / week    PT Duration 8 weeks    PT Treatment/Interventions ADLs/Self Care Home Management;Moist Heat;Gait training;Stair training;Functional mobility training;Therapeutic activities;Therapeutic exercise;Balance training;Neuromuscular re-education;Patient/family education    PT Next Visit Plan Continue plan    PT Home Exercise Plan 03/01/22: discussed finding and elevated sit surface for future STS exercises (something ~18-19 inches high)    Consulted and Agree with Plan of Care Patient             Patient will benefit from skilled therapeutic intervention in order to improve the following deficits and impairments:  balance, strength, coordination, gait, transfers. Posture. ROM   Visit Diagnosis: Muscle weakness (generalized)  Abnormality of gait and mobility  Difficulty in walking, not elsewhere classified  Unsteadiness on feet  Other abnormalities of gait and mobility    Problem List Patient Active Problem List   Diagnosis Date Noted   Acute respiratory failure due to COVID-19 (HCC) 05/28/2019   Alcohol dependence with uncomplicated withdrawal (HCC) 01/23/2018   Alcohol withdrawal (HCC) 01/23/2018   GIB (gastrointestinal bleeding) 12/09/2017   A-fib (HCC) 12/09/2017   Esophageal obstruction 10/10/2016   Esophageal ulceration 10/10/2016   GI bleed 09/20/2016   Low vitamin B12 level 03/18/2016   S/P kyphoplasty 03/18/2016   Basal cell carcinoma 02/08/2016   Atrial fibrillation (HCC) 01/27/2016   Colitis 01/27/2016   HLD (hyperlipidemia) 01/27/2016   Anxiety 01/27/2016   Hypokalemia 01/27/2016   Major depressive disorder, recurrent  episode, moderate (HCC) 04/14/2015   Anxiety, generalized 12/06/2013   Parkinson disease 08/07/2013   Dysphagia 05/12/2013   Esophageal mass 05/12/2013   GERD (gastroesophageal reflux disease) 05/12/2013   Vomiting 05/12/2013   Open bite of lower leg 05/10/2013   Dog bite of lower leg 05/10/2013   Abnormal finding on liver function 03/25/2013   Benign neoplasm 03/25/2013   Dizziness 03/25/2013   Type 2 diabetes mellitus (HCC) 03/25/2013   BP (high blood pressure) 03/25/2013   Awareness of heartbeats 03/25/2013   Pure hypercholesterolemia 03/25/2013   Infective tonsillitis 03/25/2013   Adenomatous polyp 03/25/2013    1:19 PM, 09/29/22   Norman Herrlich PT ,DPT Physical Therapist- [place]  [facility]

## 2022-09-29 NOTE — Therapy (Signed)
Occupational Therapy Neuro Treatment Note   Patient Name: Charles Stanton MRN: [id number] DOB:04/28/1955, 67 y.o., male Today]'s Date: 09/29/2022  PCP: Dr. Barbette Reichmann REFERRING PROVIDER: Dr. Cristopher Peru  END OF SESSION:  OT End of Session - 09/29/22 1656     Visit Number 29    Number of Visits 48    Date for OT Re-Evaluation 11/15/22    Progress Note Due on Visit 10    OT Start Time 1400    OT Stop Time 1445    OT Time Calculation (min) 45 min    Activity Tolerance Patient tolerated treatment well    Behavior During Therapy [facility] for tasks assessed/performed             Past Medical History:  Diagnosis Date   Abnormal liver function    Anemia    Anxiety    Atrial fibrillation (HCC)    Cancer (HCC) 2017   skin   Colonic mass    Depression    Dizziness    Dry cough    History of colon polyps    Hypercholesteremia    Hyperlipemia    Hypertension    Palpitations    Parkinson's disease    Parkinson's disease    PVD (peripheral vascular disease) (HCC)    Tonsillitis 03/25/2013   Tremors of nervous system    Past Surgical History:  Procedure Laterality Date   BACK SURGERY     Kyphoplasty   March 2018   COLONOSCOPY     COLONOSCOPY WITH PROPOFOL N/A 12/12/2017   Procedure: COLONOSCOPY WITH PROPOFOL;  Surgeon: Wyline Mood, MD;  Location: [facility] ENDOSCOPY;  Service: Gastroenterology;  Laterality: N/A;   ESOPHAGOGASTRODUODENOSCOPY N/A 12/11/2017   Procedure: ESOPHAGOGASTRODUODENOSCOPY (EGD);  Surgeon: Wyline Mood, MD;  Location: [facility] ENDOSCOPY;  Service: Gastroenterology;  Laterality: N/A;   ESOPHAGOGASTRODUODENOSCOPY N/A 10/08/2021   Procedure: ESOPHAGOGASTRODUODENOSCOPY (EGD);  Surgeon: Regis Bill, MD;  Location: [facility] ENDOSCOPY;  Service: Endoscopy;  Laterality: N/A;   ESOPHAGOGASTRODUODENOSCOPY (EGD) WITH PROPOFOL N/A 09/21/2016   Procedure: ESOPHAGOGASTRODUODENOSCOPY (EGD) WITH PROPOFOL;  Surgeon: Wyline Mood, MD;  Location: [facility] ENDOSCOPY;  Service:  Gastroenterology;  Laterality: N/A;   ESOPHAGOGASTRODUODENOSCOPY (EGD) WITH PROPOFOL N/A 11/07/2016   Procedure: ESOPHAGOGASTRODUODENOSCOPY (EGD) WITH PROPOFOL;  Surgeon: Wyline Mood, MD;  Location: [facility] ENDOSCOPY;  Service: Gastroenterology;  Laterality: N/A;   ESOPHAGOGASTRODUODENOSCOPY (EGD) WITH PROPOFOL N/A 10/15/2019   Procedure: ESOPHAGOGASTRODUODENOSCOPY (EGD) WITH PROPOFOL;  Surgeon: Regis Bill, MD;  Location: [facility] ENDOSCOPY;  Service: Endoscopy;  Laterality: N/A;   ESOPHAGOGASTRODUODENOSCOPY (EGD) WITH PROPOFOL N/A 10/28/2019   Procedure: ESOPHAGOGASTRODUODENOSCOPY (EGD) WITH PROPOFOL;  Surgeon: Regis Bill, MD;  Location: [facility] ENDOSCOPY;  Service: Endoscopy;  Laterality: N/A;   ESOPHAGOGASTRODUODENOSCOPY (EGD) WITH PROPOFOL N/A 12/02/2019   Procedure: ESOPHAGOGASTRODUODENOSCOPY (EGD) WITH PROPOFOL;  Surgeon: Regis Bill, MD;  Location: [facility] ENDOSCOPY;  Service: Endoscopy;  Laterality: N/A;   ESOPHAGOGASTRODUODENOSCOPY (EGD) WITH PROPOFOL N/A 02/18/2020   Procedure: ESOPHAGOGASTRODUODENOSCOPY (EGD) WITH PROPOFOL;  Surgeon: Regis Bill, MD;  Location: [facility] ENDOSCOPY;  Service: Endoscopy;  Laterality: N/A;   ESOPHAGOGASTRODUODENOSCOPY (EGD) WITH PROPOFOL N/A 08/02/2021   Procedure: ESOPHAGOGASTRODUODENOSCOPY (EGD) WITH PROPOFOL;  Surgeon: Regis Bill, MD;  Location: [facility] ENDOSCOPY;  Service: Endoscopy;  Laterality: N/A;  REQUEST AM   ESOPHAGOGASTRODUODENOSCOPY (EGD) WITH PROPOFOL N/A 10/05/2021   Procedure: ESOPHAGOGASTRODUODENOSCOPY (EGD) WITH PROPOFOL;  Surgeon: Regis Bill, MD;  Location: [facility] ENDOSCOPY;  Service: Endoscopy;  Laterality: N/A;   FRACTURE SURGERY Right 05/28/2019   distal radius fracture  HERNIA REPAIR     left inguinal hernia repair as an infant   KYPHOPLASTY N/A 02/11/2016   Procedure: KYPHOPLASTY  L2,T9;  Surgeon: Kennedy Bucker, MD;  Location: [facility] ORS;  Service: Orthopedics;  Laterality: N/A;   KYPHOPLASTY N/A 03/15/2016    Procedure: KYPHOPLASTY L3;  Surgeon: Kennedy Bucker, MD;  Location: [facility] ORS;  Service: Orthopedics;  Laterality: N/A;   OPEN REDUCTION INTERNAL FIXATION (ORIF) DISTAL RADIAL FRACTURE Right 05/28/2019   Procedure: OPEN REDUCTION INTERNAL FIXATION (ORIF) DISTAL RADIAL FRACTURE;  Surgeon: Kennedy Bucker, MD;  Location: [facility] ORS;  Service: Orthopedics;  Laterality: Right;   skin cancer removed     TONSILLECTOMY     Patient Active Problem List   Diagnosis Date Noted   Acute respiratory failure due to COVID-19 (HCC) 05/28/2019   Alcohol dependence with uncomplicated withdrawal (HCC) 01/23/2018   Alcohol withdrawal (HCC) 01/23/2018   GIB (gastrointestinal bleeding) 12/09/2017   A-fib (HCC) 12/09/2017   Esophageal obstruction 10/10/2016   Esophageal ulceration 10/10/2016   GI bleed 09/20/2016   Low vitamin B12 level 03/18/2016   S/P kyphoplasty 03/18/2016   Basal cell carcinoma 02/08/2016   Atrial fibrillation (HCC) 01/27/2016   Colitis 01/27/2016   HLD (hyperlipidemia) 01/27/2016   Anxiety 01/27/2016   Hypokalemia 01/27/2016   Major depressive disorder, recurrent episode, moderate (HCC) 04/14/2015   Anxiety, generalized 12/06/2013   Parkinson disease 08/07/2013   Dysphagia 05/12/2013   Esophageal mass 05/12/2013   GERD (gastroesophageal reflux disease) 05/12/2013   Vomiting 05/12/2013   Open bite of lower leg 05/10/2013   Dog bite of lower leg 05/10/2013   Abnormal finding on liver function 03/25/2013   Benign neoplasm 03/25/2013   Dizziness 03/25/2013   Type 2 diabetes mellitus (HCC) 03/25/2013   BP (high blood pressure) 03/25/2013   Awareness of heartbeats 03/25/2013   Pure hypercholesterolemia 03/25/2013   Infective tonsillitis 03/25/2013   Adenomatous polyp 03/25/2013   ONSET DATE: 10 years   REFERRING DIAG: Parkinson's Disease  THERAPY DIAG:  Muscle weakness (generalized)  Rationale for Evaluation and Treatment: Rehabilitation  SUBJECTIVE:  SUBJECTIVE STATEMENT:  Pt.  reports that his 40th wedding anniversary is coming up this weekend.  Pt accompanied by: self; spouse present in lobby, but did not follow for eval  PERTINENT HISTORY: Pt has been working with outpatient PT here at [facility] since end of Feb 2024.  OT ordered d/t worsening PD symptoms contributing to functional decline with ADLs.   PRECAUTIONS: Fall  WEIGHT BEARING RESTRICTIONS: No  PAIN:  Are you having pain? No pain reported today.  FALLS: Has patient fallen in last 6 months? Yes. Number of falls 1  LIVING ENVIRONMENT: Lives with: lives with their spouse Lives in: split level but pt remains on main level to avoid steps Stairs: Yes: Internal: 2 steps; can reach both Has following equipment at home: Quad cane small base, Walker - 4 wheeled, bed side commode, Grab bars, and pull down seat for shower, hand held shower hose,   PLOF: Needs assistance with ADLs; pt reports that spouse assists but is consistent to always let pt try things first before she automatically helps.    PATIENT GOALS: To be more indep  OBJECTIVE:  HAND DOMINANCE: Right (most affected side from PD)  ADLs: Overall ADLs: spouse is primary caregiver Transfers/ambulation related to ADLs: uses RW Eating: spouse cuts food; pt reports having to use L non-dominant hand to eat for the last 5-6 yrs Grooming: set up; assist to squeeze toothpaste and denture paste; uses  L non-dominant hand UB Dressing: set up; assist with clothing fasteners LB Dressing: Min A; assist with socks, dons slip on shoes with set up (has tried a sockaid but was unsuccessful) Toileting: modified indep Bathing: Set up/min A; spouse squeezes shampoo into pt's hands, spouse helps to towel dry  Tub Shower transfers: distant supv Equipment:  see above   IADLs: Shopping: pt can accompany spouse by pushing a shopping cart.  Light housekeeping: pt can drag trash to bin and can roll the bin to the road and back Meal Prep: spouse manages  Community  mobility: spouse drives; uses rollator for longer distances  Medication management: spouse manages pills using weekly pill organizer.   Financial management: spouse manages  Handwriting: Mild micrographia and <25% legible  MOBILITY STATUS: Hx of falls  POSTURE COMMENTS:  rounded shoulders and forward head, R shoulder elevated  ACTIVITY TOLERANCE: Activity tolerance: TBD and assessed further with functional activities   FUNCTIONAL OUTCOME MEASURES: FOTO: TBD   UPPER EXTREMITY ROM:    Active ROM Right eval Left eval Right 07/12/2022 Left 07/12/2022 Right 08/28/2022 Left  08/28/2022  Shoulder flexion 118 85 120 117 124 120  Shoulder abduction 128 124 132 127 132 130  Shoulder adduction        Shoulder extension        Shoulder internal rotation [facility] WFL  Shoulder external rotation [facility] WFL  Elbow flexion        Elbow extension        Wrist flexion 55 76 59 75 59 75  Wrist extension 57 75 61 71 61 71  Wrist ulnar deviation        Wrist radial deviation        Wrist pronation [facility] WFL  Wrist supination 63 75 66 81 55 81  (Blank rows = not tested)  UPPER EXTREMITY MMT:     MMT Right eval Left eval Right 07/12/2022 Left 07/12/2022 Right 08/23/2022 Left 08/23/2022  Shoulder flexion 4 4 4+ 4+ 4+ 4+  Shoulder abduction 4+ 4+ 4+ 4+ 4+ 4+  Shoulder adduction        Shoulder extension        Shoulder internal rotation        Shoulder external rotation        Middle trapezius        Lower trapezius        Elbow flexion 4+ 4+ 4+ 4+ 5 5  Elbow extension 4+ 4+ 4+ 4+ 4+ 4+  Wrist flexion 4+ 4+ 4+ 4+ 4+ 4+  Wrist extension 4+ 4+ 4+ 4+ 4+ 4+  Wrist ulnar deviation        Wrist radial deviation        Wrist pronation        Wrist supination        (Blank rows = not tested)  HAND FUNCTION: Grip strength: Right: 16 lbs; Left: 38 lbs, Lateral pinch: Right: 10 lbs, Left: 14 lbs, and 3 point pinch: Right: 9 lbs, Left: 11 lbs 07/12/2022: Grip  strength: Right: 16 lbs; Left: 40 lbs, Lateral pinch: Right: 10 lbs, Left: 14 lbs, and 3 point pinch: Right: 9 lbs, Left: 15 lbs  08/23/2022: Grip strength: Right: 7 lbs; Left: 30 lbs, Lateral pinch: Right: 10 lbs, Left: 14 lbs, and 3 point pinch: Right: 6 lbs, Left: 14 lbs COORDINATION: 9 Hole Peg test: Right: 38 sec; Left: 40 sec 07/12/2022: 9 Hole  Peg test: Right: 35 sec; Left: 36 sec 08/23/2022: 9 Hole Peg test: Right: 51 sec; Left: 39 sec SENSATION: WFL  EDEMA: None  MUSCLE TONE: R/L normal  COGNITION: Overall cognitive status: Within functional limits for tasks assessed  VISION: Subjective report: wears glasses all the time    PRAXIS: Impaired: Motor planning  OBSERVATIONS:  R SF PIP flexion contracture 105* from 5-6 years ago from a fall  TODAY'S TREATMENT:                                                                                                                              DATE: 09/29/2022:  There. Ex.:  Pt. worked on [employer], and reciprocal motion using the UBE while seated for 8 min. with no resistance. constant monitoring was provided. Pt. performed bilateral gross gripping with a gross grip strengthener. Pt. worked on sustaining grip while grasping pegs and reaching at various heights. The Gripper was set to 11.2# of grip strength resistance on the right, and 17.9# on the left.  Pt. worked on using bilateral UE's to grasp large flat shapes, and reach up to move them through 4 vertical dowels of progressively increasing heights. Pt. Utilized a 2# wrist cuff weight in place while reaching.             PATIENT EDUCATION: Education details:  UE strengthening Person educated: Patient Education method: Explanation and Verbal cues, demo Education comprehension: verbalized understanding, demonstrated understanding  HOME EXERCISE PROGRAM: Theraputty   GOALS: Goals reviewed with patient? Yes  SHORT TERM GOALS: Target date: 10/04/2022   (6 weeks)  Pt  will be indep to perform HEP for improving bilat hand strength and coordination skills. Baseline: Eval: not yet initiated 07/12/2022: Completing theraputty exercises independent at home Goal status: Met/Achieved  LONG TERM GOALS: Target date: 11/15/2022 (12 weeks)  Pt will increase FOTO score to (TBD) or better to indicate improvement in self perceived functional use of the R arm with daily tasks. Baseline: Eval: TBD 07/12/2022: TBD Goal status: Discontinued  2.  Pt will increase R grip strength by 5 or more lbs to more easily hold and stabilize ADL supplies in the R dominant hand. Baseline: 08/25/2022: Grip strength: Right: 7 lbs; Left: 30 lbs 08/23/2022:  Grip strength: Right: 7 lbs; Left: 30 lbs R grip 16 lbs; limited from PD but also by 5th digit PIP flexion contracture, as this digit can not engage in gripping (L 38 lbs) 07/12/2022: Grip strength: Right: 16 lbs; limited from PD but also by 5th digit PIP flexion contracture, as this digit still can not engage in gripping (L 40 lbs) Goal status: Ongoing  3.  Pt will improve R hand FMC/dexterity skills to be able to sign his name on medical/legal documents with 75% legibility, using adapted writing aids as needed. Baseline: 08/25/2022: Improving writing, however this is dependent on whether or not he is have a bad day with tremors 08/23/2022: Improving writing, however this is  dependent on whether or not he is have a bad day with tremors. Eval: <25% legible and pt verbalizes embarrassment when attempting to sign his name on forms 07/12/2022: 75% legibility when writing his name in print, however reports he is still not satisfied with his signature looks. Goal status: Ongoing  4.  Pt will increase R FMC/GMC skills to engage the RUE into UB ADLs at least 50% of the time. Baseline:  08/25/2022: Pt. Is trying to engage his right UE more during daily ADL, and IADL tasks. Eval: Pt uses the L non-dominant arm for self care tasks. 08/23/2022: Pt. Is trying to  engage his right UE more during daily ADL, and IADL tasks. Eval: Pt uses the L non-dominant arm for self care tasks. 07/12/22: Pt. Reports that he has been trying to incorporate his R hand while still using the L arm to complete all self care tasks.  Goal status: Ongoing  5. Pt. Will improve typing skills  by 1 min.  In preparation for typing an email correspondence  Baseline:  08/25/2022: Pt. Typed I sentence in 1 min. & 27 sec. With the left hand. Pt. Typed 1 sentence in 1 min. & 40 sec. With bilateral hands. Limited typing engagement with the right hand.  08/23/2022: Pt. Typed I sentence in 1 min. & 27 sec. With the left hand. Pt. Typed 1 sentence in 1 min. & 40 sec. With bilateral hands. Limited typing engagement with the right hand.    Goal status: New   ASSESSMENT:  CLINICAL IMPRESSION:  Pt. reports that he has started [employer] 3x's a week. Pt. continues to tolerate the UE strengthening, grip, and pinch strengthening well today. Pt. continues to require rest breaks during the reaching tasks. Pt. requires cues for hand position on the Digi[employer]. Pt. Tolerated the gross grip strengthening well today. Pt. requires cues for visual demonstration of proper technique with each exercise. Pt. continues to require rest breaks between each exercise. Plan to take measurements, and do a progress report update next visit. Pt. continues to benefit from skilled OT for increasing bilateral hand strength, improving coordination, specifically working to increase engagement of the RUE during self care tasks.    PERFORMANCE DEFICITS: in functional skills including ADLs, IADLs, coordination, dexterity, ROM, strength, flexibility, Fine motor control, Gross motor control, mobility, balance, body mechanics, endurance, decreased knowledge of use of DME, and UE functional use, cognitive skills including emotional and memory, and psychosocial skills including coping strategies, environmental adaptation,  habits, and routines and behaviors.   IMPAIRMENTS: are limiting patient from ADLs, IADLs, and leisure.   CO-MORBIDITIES: has co-morbidities such as anxiety, depression  that affects occupational performance. Patient will benefit from skilled OT to address above impairments and improve overall function.  MODIFICATION OR ASSISTANCE TO COMPLETE EVALUATION: No modification of tasks or assist necessary to complete an evaluation.  OT OCCUPATIONAL PROFILE AND HISTORY: Problem focused assessment: Including review of records relating to presenting problem.  CLINICAL DECISION MAKING: Moderate - several treatment options, min-mod task modification necessary  REHAB POTENTIAL: Good for goals  EVALUATION COMPLEXITY: Moderate    PLAN:  OT FREQUENCY: 2x/week  OT DURATION: 12 weeks  PLANNED INTERVENTIONS: self care/ADL training, therapeutic exercise, therapeutic activity, neuromuscular re-education, manual therapy, passive range of motion, balance training, functional mobility training, moist heat, cryotherapy, patient/family education, cognitive remediation/compensation, psychosocial skills training, energy conservation, coping strategies training, and DME and/or AE instructions RECOMMENDED OTHER SERVICES: None at this time  CONSULTED AND AGREED WITH PLAN  OF CARE: Patient  PLAN FOR NEXT SESSION: handwriting  Olegario Messier, M.S. OTR/L  09/29/22, 4:58 PM

## 2022-10-04 ENCOUNTER — Ambulatory Visit: Payer: Medicare Other | Admitting: Occupational Therapy

## 2022-10-04 ENCOUNTER — Encounter: Payer: Self-pay | Admitting: Physical Therapy

## 2022-10-04 ENCOUNTER — Ambulatory Visit: Payer: Medicare Other | Attending: Neurology | Admitting: Physical Therapy

## 2022-10-04 DIAGNOSIS — M6281 Muscle weakness (generalized): Secondary | ICD-10-CM | POA: Insufficient documentation

## 2022-10-04 DIAGNOSIS — R2681 Unsteadiness on feet: Secondary | ICD-10-CM | POA: Diagnosis present

## 2022-10-04 DIAGNOSIS — R262 Difficulty in walking, not elsewhere classified: Secondary | ICD-10-CM | POA: Insufficient documentation

## 2022-10-04 DIAGNOSIS — R2689 Other abnormalities of gait and mobility: Secondary | ICD-10-CM | POA: Diagnosis present

## 2022-10-04 DIAGNOSIS — R269 Unspecified abnormalities of gait and mobility: Secondary | ICD-10-CM | POA: Insufficient documentation

## 2022-10-04 NOTE — Therapy (Signed)
Outpatient Physical Therapy Treatment/ Discharge     Patient Details  Name: Charles Stanton MRN: [id number] Date of Birth: 27-Mar-1955 Referring Provider (PT): Dr. Cristopher Stanton   Encounter Date: 10/04/2022  END OF SESSION:    PT End of Session - 10/04/22 1318     Visit Number 56    Number of Visits 62    Date for PT Re-Evaluation 10/11/22    Authorization Type Medicare A & B primary; federal employee secondary    Progress Note Due on Visit 60    PT Start Time 1316    PT Stop Time 1358    PT Time Calculation (min) 42 min    Equipment Utilized During Treatment Gait belt    Activity Tolerance Patient tolerated treatment well;No increased pain    Behavior During Therapy [employer] for tasks assessed/performed                   Past Medical History:  Diagnosis Date   Abnormal liver function    Anemia    Anxiety    Atrial fibrillation (HCC)    Cancer (HCC) 2017   skin   Colonic mass    Depression    Dizziness    Dry cough    History of colon polyps    Hypercholesteremia    Hyperlipemia    Hypertension    Palpitations    Parkinson's disease (HCC)    Parkinson's disease (HCC)    PVD (peripheral vascular disease) (HCC)    Tonsillitis 03/25/2013   Tremors of nervous system     Past Surgical History:  Procedure Laterality Date   BACK SURGERY     Kyphoplasty   March 2018   COLONOSCOPY     COLONOSCOPY WITH PROPOFOL N/A 12/12/2017   Procedure: COLONOSCOPY WITH PROPOFOL;  Surgeon: Charles Mood, MD;  Location: [facility] ENDOSCOPY;  Service: Gastroenterology;  Laterality: N/A;   ESOPHAGOGASTRODUODENOSCOPY N/A 12/11/2017   Procedure: ESOPHAGOGASTRODUODENOSCOPY (EGD);  Surgeon: Charles Mood, MD;  Location: [facility] ENDOSCOPY;  Service: Gastroenterology;  Laterality: N/A;   ESOPHAGOGASTRODUODENOSCOPY N/A 10/08/2021   Procedure: ESOPHAGOGASTRODUODENOSCOPY (EGD);  Surgeon: Charles Bill, MD;  Location: [facility] ENDOSCOPY;  Service: Endoscopy;  Laterality: N/A;    ESOPHAGOGASTRODUODENOSCOPY (EGD) WITH PROPOFOL N/A 09/21/2016   Procedure: ESOPHAGOGASTRODUODENOSCOPY (EGD) WITH PROPOFOL;  Surgeon: Charles Mood, MD;  Location: [facility] ENDOSCOPY;  Service: Gastroenterology;  Laterality: N/A;   ESOPHAGOGASTRODUODENOSCOPY (EGD) WITH PROPOFOL N/A 11/07/2016   Procedure: ESOPHAGOGASTRODUODENOSCOPY (EGD) WITH PROPOFOL;  Surgeon: Charles Mood, MD;  Location: [facility] ENDOSCOPY;  Service: Gastroenterology;  Laterality: N/A;   ESOPHAGOGASTRODUODENOSCOPY (EGD) WITH PROPOFOL N/A 10/15/2019   Procedure: ESOPHAGOGASTRODUODENOSCOPY (EGD) WITH PROPOFOL;  Surgeon: Charles Bill, MD;  Location: [facility] ENDOSCOPY;  Service: Endoscopy;  Laterality: N/A;   ESOPHAGOGASTRODUODENOSCOPY (EGD) WITH PROPOFOL N/A 10/28/2019   Procedure: ESOPHAGOGASTRODUODENOSCOPY (EGD) WITH PROPOFOL;  Surgeon: Charles Bill, MD;  Location: [facility] ENDOSCOPY;  Service: Endoscopy;  Laterality: N/A;   ESOPHAGOGASTRODUODENOSCOPY (EGD) WITH PROPOFOL N/A 12/02/2019   Procedure: ESOPHAGOGASTRODUODENOSCOPY (EGD) WITH PROPOFOL;  Surgeon: Charles Bill, MD;  Location: [facility] ENDOSCOPY;  Service: Endoscopy;  Laterality: N/A;   ESOPHAGOGASTRODUODENOSCOPY (EGD) WITH PROPOFOL N/A 02/18/2020   Procedure: ESOPHAGOGASTRODUODENOSCOPY (EGD) WITH PROPOFOL;  Surgeon: Charles Bill, MD;  Location: [facility] ENDOSCOPY;  Service: Endoscopy;  Laterality: N/A;   ESOPHAGOGASTRODUODENOSCOPY (EGD) WITH PROPOFOL N/A 08/02/2021   Procedure: ESOPHAGOGASTRODUODENOSCOPY (EGD) WITH PROPOFOL;  Surgeon: Charles Bill, MD;  Location: [facility] ENDOSCOPY;  Service: Endoscopy;  Laterality: N/A;  REQUEST AM   ESOPHAGOGASTRODUODENOSCOPY (EGD) WITH  PROPOFOL N/A 10/05/2021   Procedure: ESOPHAGOGASTRODUODENOSCOPY (EGD) WITH PROPOFOL;  Surgeon: Charles Bill, MD;  Location: [facility] ENDOSCOPY;  Service: Endoscopy;  Laterality: N/A;   FRACTURE SURGERY Right 05/28/2019   distal radius fracture   HERNIA REPAIR     left inguinal hernia repair as an  infant   KYPHOPLASTY N/A 02/11/2016   Procedure: KYPHOPLASTY  L2,T9;  Surgeon: Charles Bucker, MD;  Location: [facility] ORS;  Service: Orthopedics;  Laterality: N/A;   KYPHOPLASTY N/A 03/15/2016   Procedure: KYPHOPLASTY L3;  Surgeon: Charles Bucker, MD;  Location: [facility] ORS;  Service: Orthopedics;  Laterality: N/A;   OPEN REDUCTION INTERNAL FIXATION (ORIF) DISTAL RADIAL FRACTURE Right 05/28/2019   Procedure: OPEN REDUCTION INTERNAL FIXATION (ORIF) DISTAL RADIAL FRACTURE;  Surgeon: Charles Bucker, MD;  Location: [facility] ORS;  Service: Orthopedics;  Laterality: Right;   skin cancer removed     TONSILLECTOMY       Subjective Assessment -      Subjective Pt reports doing well today. No updates. Started rock steady boxing this week and liked it overall.     Pertinent History Charles Stanton is a 66yoM who was prescribed with Parkinson's Disease ~ 10 years.  Pt is followed by neurology, reports good reponse to medications which help control his tremors somewhat. Pt uses a SPC for AMB, also uses a walker for longer distance walking. Pt goes into community almost daily (they like to eat out) which includes lots of SPC [place] eand navigation of 4 stairs at home.    Currently in Pain? No/denies            OBJECTIVE:   TREATMENT TODAY 10/04/22  Physical therapy treatment session today consisted of completing assessment of goals and administration of testing as demonstrated and documented in flow sheet, treatment, and goals section of this note. Addition treatments may be found below.    [facility] PT Assessment - 10/04/22 0001       Dynamic Gait Index   Level Surface Mild Impairment    Change in Gait Speed Mild Impairment    Gait with Horizontal Head Turns Mild Impairment    Gait with Vertical Head Turns Mild Impairment    Gait and Pivot Turn Mild Impairment    Step Over Obstacle Moderate Impairment    Step Around Obstacles Mild Impairment    Steps Mild Impairment    Total Score 15             NMR Sated and  standing PWR! Up, rock, twisty, and step with pt challenged to instruct PT to ensure he has good form and efficacy for independent completion.      PT Education -     Education Details Pt educated regarding benefits of continuing with PD specific exercise.       Person(s) Educated Patient    Methods Explanation;Demonstration ; handout   Comprehension Verbalized understanding;Returned demonstration;Need further instruction           PT Short Term Goals -       PT SHORT TERM GOAL #1   Title Pt to report regular performance of exercises prescribed for home and a sense of improvements in strength and balance AEB them not being as challenging anymore.    Baseline Will issue on visit 2    Time 4    Period Weeks    Status met   Target Date 03/15/22      PT SHORT TERM GOAL #2   Title Pt to demonstrate improved power AEB  improved 5xSTS hands free in <18sec.    Baseline 03/01/22: 22sec hands free, author provideing foot block bilat 03/31/22: 14.82sec 4/23:17.3 sec   Time 4    Period Weeks    Status Met    Target Date 03/29/22               PT Long Term Goals -        PT LONG TERM GOAL #1   Title Pt to improve score on FOTO survey to 57 to indicate reduced difficulty with basic mobility required for ADL performance.    Baseline 03/01/22:49 04/29/22:56%. 06/16/22: 60  09/08/22:60   Time 8    Period Weeks    Status Met          PT LONG TERM GOAL #2   Title Pt to improve tolerance to overground AMB c SPC AEB performance >825ft.    Baseline 2/27: fatigued after 466ft lap performed in four minutes ten seconds. 03/31/22: 713ft with SPC no rest break 04/26/22: 794 ft 06/16/22: 903 ft with SPC, no rest.   Time 8    Period Weeks    Status Met         PT LONG TERM GOAL #3   Title Pt to demonstrate improved leg power AEB 5xSTS from chair <14sec hands free and without need for minGuard assist for safety and without need for foot block.    Baseline 03/01/22: 22sec hands free,  feet blocked, minGuard assist  03/31/22: 14.82sec  4/23:17.3 sec 06/16/22: 13.87 sec 7/16: 11.55 sec   Time 8     Period Weeks    Status MET         PT LONG TERM GOAL #4   Title Pt to demonstrate improvement in balance performance AEB >6 improvement BERG Balance for balance assessment.    Baseline 41 on 03/03/22. 3/38/24: 42 4/23: 45 06/16/22: 48   Time 8    Period Weeks    Status Met          5.  Pt will improve distance with LRAD to 900 feet or greater in order to indicate further progress to community ambulation distances Baseline: 794 ft with SPC , 06/16/22: 903 ft with SPC. 07/19/22: 905 feet Goal status: Met  6.  Patient will improve modified DGI (with use of cane) score > 4 points or greater in order to indicate improved balance and decreased risk of falls.  Baseline: 6/18: 11 7/16: 14 10/1:15 Goal status: MET  7. Patient will be able to withstand various light perturbations while preforming ambulation with LRAD utilizing safe balance strategies in order to simulate safe and confident walking in crowded places. Baseline: Pt currently does not feel safe preforming task, could walk through cafeteria to get baseline. 7/23: 70% confidence with activity, mild LOB with perturbations.  9/5: able to complete this task on multiple occasions without LOB.   Goal Status: MET   8. Pt will be compliant and independent with Parkinson's based HEP by reciting and demonstrating full PWR! Move sets in seated and or standing with no cueing from therapist. Baseline: Pt currently needs demonstration or verbal cueing for proper completion  7/23: cues required for proper completion with PWR! Up, rock, and twist in seated position  10/1: completes seated independently without cues and with aid of handout.  Goal Status: MET     Plan     Clinical Impression Statement Patient presents to PT for discharge therapy this date. Pt has met all of  his short and long term PT goals. Pt has started rock steady  boxing 3 x per week and is comfortable and competent with his advanced HEP consisting of PWR! Seated and standing moves. PT will be discharged with Hep this date with all questions answered and happy with the plan moving forward.    Personal Factors and Comorbidities Time since onset of injury/illness/exacerbation;Past/Current Experience    Examination-Activity Limitations Locomotion Level;Transfers;Bed Mobility;Reach Overhead;Hygiene/Grooming;Dressing;Toileting;Stairs;Bend    Stability/Clinical Decision Making Evolving/Moderate complexity    Clinical Decision Making Moderate    Rehab Potential Good    PT Frequency 2x / week    PT Duration 8 weeks    PT Treatment/Interventions ADLs/Self Care Home Management;Moist Heat;Gait training;Stair training;Functional mobility training;Therapeutic activities;Therapeutic exercise;Balance training;Neuromuscular re-education;Patient/family education    PT Next Visit Plan Continue plan    PT Home Exercise Plan 03/01/22: discussed finding and elevated sit surface for future STS exercises (something ~18-19 inches high)    Consulted and Agree with Plan of Care Patient             Patient will benefit from skilled therapeutic intervention in order to improve the following deficits and impairments:  balance, strength, coordination, gait, transfers. Posture. ROM   Visit Diagnosis: Abnormality of gait and mobility  Difficulty in walking, not elsewhere classified  Unsteadiness on feet  Other abnormalities of gait and mobility  Muscle weakness (generalized)    Problem List Patient Active Problem List   Diagnosis Date Noted   Acute respiratory failure due to COVID-19 (HCC) 05/28/2019   Alcohol dependence with uncomplicated withdrawal (HCC) 01/23/2018   Alcohol withdrawal (HCC) 01/23/2018   GIB (gastrointestinal bleeding) 12/09/2017   A-fib (HCC) 12/09/2017   Esophageal obstruction 10/10/2016   Esophageal ulceration 10/10/2016   GI bleed 09/20/2016    Low vitamin B12 level 03/18/2016   S/P kyphoplasty 03/18/2016   Basal cell carcinoma 02/08/2016   Atrial fibrillation (HCC) 01/27/2016   Colitis 01/27/2016   HLD (hyperlipidemia) 01/27/2016   Anxiety 01/27/2016   Hypokalemia 01/27/2016   Major depressive disorder, recurrent episode, moderate (HCC) 04/14/2015   Anxiety, generalized 12/06/2013   Parkinson disease (HCC) 08/07/2013   Dysphagia 05/12/2013   Esophageal mass 05/12/2013   GERD (gastroesophageal reflux disease) 05/12/2013   Vomiting 05/12/2013   Open bite of lower leg 05/10/2013   Dog bite of lower leg 05/10/2013   Abnormal finding on liver function 03/25/2013   Benign neoplasm 03/25/2013   Dizziness 03/25/2013   Type 2 diabetes mellitus (HCC) 03/25/2013   BP (high blood pressure) 03/25/2013   Awareness of heartbeats 03/25/2013   Pure hypercholesterolemia 03/25/2013   Infective tonsillitis 03/25/2013   Adenomatous polyp 03/25/2013    1:19 PM, 10/04/22   Norman Herrlich PT ,DPT Physical Therapist- [place]  [facility]

## 2022-10-04 NOTE — Therapy (Signed)
Occupational Therapy Progress/Discharge Note  Dates of reporting period  08/25/2022   to   10/04/2022    Patient Name: Charles Stanton MRN: [id number] DOB:03-Dec-1955, 67 y.o., male Today]'s Date: 10/04/2022  PCP: Dr. Barbette Reichmann REFERRING PROVIDER: Dr. Cristopher Peru  END OF SESSION:  OT End of Session - 10/04/22 1408     Visit Number 30    Number of Visits 48    Date for OT Re-Evaluation 11/15/22    OT Start Time 1400    OT Stop Time 1445    OT Time Calculation (min) 45 min    Activity Tolerance Patient tolerated treatment well    Behavior During Therapy [facility] for tasks assessed/performed             Past Medical History:  Diagnosis Date   Abnormal liver function    Anemia    Anxiety    Atrial fibrillation (HCC)    Cancer (HCC) 2017   skin   Colonic mass    Depression    Dizziness    Dry cough    History of colon polyps    Hypercholesteremia    Hyperlipemia    Hypertension    Palpitations    Parkinson's disease (HCC)    Parkinson's disease (HCC)    PVD (peripheral vascular disease) (HCC)    Tonsillitis 03/25/2013   Tremors of nervous system    Past Surgical History:  Procedure Laterality Date   BACK SURGERY     Kyphoplasty   March 2018   COLONOSCOPY     COLONOSCOPY WITH PROPOFOL N/A 12/12/2017   Procedure: COLONOSCOPY WITH PROPOFOL;  Surgeon: Wyline Mood, MD;  Location: [facility] ENDOSCOPY;  Service: Gastroenterology;  Laterality: N/A;   ESOPHAGOGASTRODUODENOSCOPY N/A 12/11/2017   Procedure: ESOPHAGOGASTRODUODENOSCOPY (EGD);  Surgeon: Wyline Mood, MD;  Location: [facility] ENDOSCOPY;  Service: Gastroenterology;  Laterality: N/A;   ESOPHAGOGASTRODUODENOSCOPY N/A 10/08/2021   Procedure: ESOPHAGOGASTRODUODENOSCOPY (EGD);  Surgeon: Regis Bill, MD;  Location: [facility] ENDOSCOPY;  Service: Endoscopy;  Laterality: N/A;   ESOPHAGOGASTRODUODENOSCOPY (EGD) WITH PROPOFOL N/A 09/21/2016   Procedure: ESOPHAGOGASTRODUODENOSCOPY (EGD) WITH PROPOFOL;  Surgeon: Wyline Mood, MD;   Location: [facility] ENDOSCOPY;  Service: Gastroenterology;  Laterality: N/A;   ESOPHAGOGASTRODUODENOSCOPY (EGD) WITH PROPOFOL N/A 11/07/2016   Procedure: ESOPHAGOGASTRODUODENOSCOPY (EGD) WITH PROPOFOL;  Surgeon: Wyline Mood, MD;  Location: [facility] ENDOSCOPY;  Service: Gastroenterology;  Laterality: N/A;   ESOPHAGOGASTRODUODENOSCOPY (EGD) WITH PROPOFOL N/A 10/15/2019   Procedure: ESOPHAGOGASTRODUODENOSCOPY (EGD) WITH PROPOFOL;  Surgeon: Regis Bill, MD;  Location: [facility] ENDOSCOPY;  Service: Endoscopy;  Laterality: N/A;   ESOPHAGOGASTRODUODENOSCOPY (EGD) WITH PROPOFOL N/A 10/28/2019   Procedure: ESOPHAGOGASTRODUODENOSCOPY (EGD) WITH PROPOFOL;  Surgeon: Regis Bill, MD;  Location: [facility] ENDOSCOPY;  Service: Endoscopy;  Laterality: N/A;   ESOPHAGOGASTRODUODENOSCOPY (EGD) WITH PROPOFOL N/A 12/02/2019   Procedure: ESOPHAGOGASTRODUODENOSCOPY (EGD) WITH PROPOFOL;  Surgeon: Regis Bill, MD;  Location: [facility] ENDOSCOPY;  Service: Endoscopy;  Laterality: N/A;   ESOPHAGOGASTRODUODENOSCOPY (EGD) WITH PROPOFOL N/A 02/18/2020   Procedure: ESOPHAGOGASTRODUODENOSCOPY (EGD) WITH PROPOFOL;  Surgeon: Regis Bill, MD;  Location: [facility] ENDOSCOPY;  Service: Endoscopy;  Laterality: N/A;   ESOPHAGOGASTRODUODENOSCOPY (EGD) WITH PROPOFOL N/A 08/02/2021   Procedure: ESOPHAGOGASTRODUODENOSCOPY (EGD) WITH PROPOFOL;  Surgeon: Regis Bill, MD;  Location: [facility] ENDOSCOPY;  Service: Endoscopy;  Laterality: N/A;  REQUEST AM   ESOPHAGOGASTRODUODENOSCOPY (EGD) WITH PROPOFOL N/A 10/05/2021   Procedure: ESOPHAGOGASTRODUODENOSCOPY (EGD) WITH PROPOFOL;  Surgeon: Regis Bill, MD;  Location: [facility] ENDOSCOPY;  Service: Endoscopy;  Laterality: N/A;   FRACTURE SURGERY Right 05/28/2019  distal radius fracture   HERNIA REPAIR     left inguinal hernia repair as an infant   KYPHOPLASTY N/A 02/11/2016   Procedure: KYPHOPLASTY  L2,T9;  Surgeon: Kennedy Bucker, MD;  Location: [facility] ORS;  Service: Orthopedics;  Laterality:  N/A;   KYPHOPLASTY N/A 03/15/2016   Procedure: KYPHOPLASTY L3;  Surgeon: Kennedy Bucker, MD;  Location: [facility] ORS;  Service: Orthopedics;  Laterality: N/A;   OPEN REDUCTION INTERNAL FIXATION (ORIF) DISTAL RADIAL FRACTURE Right 05/28/2019   Procedure: OPEN REDUCTION INTERNAL FIXATION (ORIF) DISTAL RADIAL FRACTURE;  Surgeon: Kennedy Bucker, MD;  Location: [facility] ORS;  Service: Orthopedics;  Laterality: Right;   skin cancer removed     TONSILLECTOMY     Patient Active Problem List   Diagnosis Date Noted   Acute respiratory failure due to COVID-19 (HCC) 05/28/2019   Alcohol dependence with uncomplicated withdrawal (HCC) 01/23/2018   Alcohol withdrawal (HCC) 01/23/2018   GIB (gastrointestinal bleeding) 12/09/2017   A-fib (HCC) 12/09/2017   Esophageal obstruction 10/10/2016   Esophageal ulceration 10/10/2016   GI bleed 09/20/2016   Low vitamin B12 level 03/18/2016   S/P kyphoplasty 03/18/2016   Basal cell carcinoma 02/08/2016   Atrial fibrillation (HCC) 01/27/2016   Colitis 01/27/2016   HLD (hyperlipidemia) 01/27/2016   Anxiety 01/27/2016   Hypokalemia 01/27/2016   Major depressive disorder, recurrent episode, moderate (HCC) 04/14/2015   Anxiety, generalized 12/06/2013   Parkinson disease (HCC) 08/07/2013   Dysphagia 05/12/2013   Esophageal mass 05/12/2013   GERD (gastroesophageal reflux disease) 05/12/2013   Vomiting 05/12/2013   Open bite of lower leg 05/10/2013   Dog bite of lower leg 05/10/2013   Abnormal finding on liver function 03/25/2013   Benign neoplasm 03/25/2013   Dizziness 03/25/2013   Type 2 diabetes mellitus (HCC) 03/25/2013   BP (high blood pressure) 03/25/2013   Awareness of heartbeats 03/25/2013   Pure hypercholesterolemia 03/25/2013   Infective tonsillitis 03/25/2013   Adenomatous polyp 03/25/2013   ONSET DATE: 10 years   REFERRING DIAG: Parkinson's Disease  THERAPY DIAG:  Muscle weakness (generalized)  Rationale for Evaluation and Treatment:  Rehabilitation  SUBJECTIVE:  SUBJECTIVE STATEMENT:  Pt. reports that his boxing classes are going well.  Pt accompanied by: self; spouse present in lobby, but did not follow for eval  PERTINENT HISTORY: Pt has been working with outpatient PT here at [facility] since end of Feb 2024.  OT ordered d/t worsening PD symptoms contributing to functional decline with ADLs.   PRECAUTIONS: Fall  WEIGHT BEARING RESTRICTIONS: No  PAIN:  Are you having pain? No pain reported today.  FALLS: Has patient fallen in last 6 months? Yes. Number of falls 1  LIVING ENVIRONMENT: Lives with: lives with their spouse Lives in: split level but pt remains on main level to avoid steps Stairs: Yes: Internal: 2 steps; can reach both Has following equipment at home: Quad cane small base, Walker - 4 wheeled, bed side commode, Grab bars, and pull down seat for shower, hand held shower hose,   PLOF: Needs assistance with ADLs; pt reports that spouse assists but is consistent to always let pt try things first before she automatically helps.    PATIENT GOALS: To be more indep  OBJECTIVE:  HAND DOMINANCE: Right (most affected side from PD)  ADLs: Overall ADLs: spouse is primary caregiver Transfers/ambulation related to ADLs: uses RW Eating: spouse cuts food; pt reports having to use L non-dominant hand to eat for the last 5-6 yrs Grooming: set up; assist to squeeze toothpaste and  denture paste; uses L non-dominant hand UB Dressing: set up; assist with clothing fasteners LB Dressing: Min A; assist with socks, dons slip on shoes with set up (has tried a sockaid but was unsuccessful) Toileting: modified indep Bathing: Set up/min A; spouse squeezes shampoo into pt's hands, spouse helps to towel dry  Tub Shower transfers: distant supv Equipment:  see above   IADLs: Shopping: pt can accompany spouse by pushing a shopping cart.  Light housekeeping: pt can drag trash to bin and can roll the bin to the road and back Meal  Prep: spouse manages  Community mobility: spouse drives; uses rollator for longer distances  Medication management: spouse manages pills using weekly pill organizer.   Financial management: spouse manages  Handwriting: Mild micrographia and <25% legible  MOBILITY STATUS: Hx of falls  POSTURE COMMENTS:  rounded shoulders and forward head, R shoulder elevated  ACTIVITY TOLERANCE: Activity tolerance: TBD and assessed further with functional activities   FUNCTIONAL OUTCOME MEASURES: FOTO: TBD   UPPER EXTREMITY ROM:    Active ROM Right eval Left eval Right 07/12/2022 Left 07/12/2022 Right 08/28/2022 Left  08/28/2022 Right 10/04/22 Left  10/04/22  Shoulder flexion 118 85 120 117 124 120 128 122  Shoulder abduction 128 124 132 127 132 130 130 130  Shoulder adduction          Shoulder extension          Shoulder internal rotation [facility] WFL  Shoulder external rotation [facility] WFL  Elbow flexion          Elbow extension          Wrist flexion 55 76 59 75 59 75 72 83  Wrist extension 57 75 61 71 61 71 61 74  Wrist ulnar deviation          Wrist radial deviation          Wrist pronation [facility] WFL WFL  Wrist supination 63 75 66 81 55 81 85 82  (Blank rows = not tested)  UPPER EXTREMITY MMT:     MMT Right eval Left eval Right 07/12/2022 Left 07/12/2022 Right 08/23/2022 Left 08/23/2022 Right 10/04/22 Left  10/04/22  Shoulder flexion 4 4 4+ 4+ 4+ 4+ 4+ 4+  Shoulder abduction 4+ 4+ 4+ 4+ 4+ 4+ 4+ 4+  Shoulder adduction          Shoulder extension          Shoulder internal rotation          Shoulder external rotation          Middle trapezius          Lower trapezius          Elbow flexion 4+ 4+ 4+ 4+ 5 5 5 5   Elbow extension 4+ 4+ 4+ 4+ 4+ 4+ 4+ 4+  Wrist flexion 4+ 4+ 4+ 4+ 4+ 4+ 4+ 4+  Wrist extension 4+ 4+ 4+ 4+ 4+ 4+ 4+ 4+  Wrist ulnar deviation          Wrist radial deviation          Wrist pronation           Wrist supination          (Blank rows = not tested)  HAND FUNCTION: Grip strength: Right: 16 lbs; Left: 38 lbs, Lateral pinch: Right: 10 lbs, Left: 14 lbs, and 3 point pinch: Right: 9  lbs, Left: 11 lbs 07/12/2022: Grip strength: Right: 16 lbs; Left: 40 lbs, Lateral pinch: Right: 10 lbs, Left: 14 lbs, and 3 point pinch: Right: 9 lbs, Left: 15 lbs  08/23/2022: Grip strength: Right: 7 lbs; Left: 30 lbs, Lateral pinch: Right: 10 lbs, Left: 14 lbs, and 3 point pinch: Right: 6 lbs, Left: 14 lbs 10/04/2022: Grip strength: Right: 12 lbs; Left: 35 lbs, Lateral pinch: Right: 10 lbs, Left: 14 lbs, and 3 point pinch: Right: 6 lbs, Left: 14 lbs COORDINATION: 9 Hole Peg test: Right: 38 sec; Left: 40 sec 07/12/2022: 9 Hole Peg test: Right: 35 sec; Left: 36 sec 08/23/2022: 9 Hole Peg test: Right: 51 sec; Left: 39 sec 10/04/22: 9 Hole Peg test: Right: 38 sec; Left: 36 sec SENSATION: WFL  EDEMA: None  MUSCLE TONE: R/L normal  COGNITION: Overall cognitive status: Within functional limits for tasks assessed  VISION: Subjective report: wears glasses all the time    PRAXIS: Impaired: Motor planning  OBSERVATIONS:  R SF PIP flexion contracture 105* from 5-6 years ago from a fall  TODAY'S TREATMENT:                                                                                                                              DATE: 10/04/2022:  Measurements were obtained, and goals were reviewed with the Pt.     PATIENT EDUCATION: Education details:  UE strengthening Person educated: Patient Education method: Explanation and Verbal cues, demo Education comprehension: verbalized understanding, demonstrated understanding  HOME EXERCISE PROGRAM: Theraputty   GOALS: Goals reviewed with patient? Yes  SHORT TERM GOALS: Target date: 10/04/2022   (6 weeks)  Pt will be indep to perform HEP for improving bilat hand strength and coordination skills. Baseline: Eval: not yet initiated 07/12/2022: Completing  theraputty exercises independent at home Goal status: Met/Achieved  LONG TERM GOALS: Target date: 11/15/2022 (12 weeks)  Pt will increase FOTO score to (TBD) or better to indicate improvement in self perceived functional use of the R arm with daily tasks. Baseline: Eval: TBD 07/12/2022: TBD Goal status: Discontinued  2.  Pt will increase R grip strength by 5 or more lbs to more easily hold and stabilize ADL supplies in the R dominant hand. Baseline: 10/04/2022: Right 12#, left: 35# 08/25/2022: Grip strength: Right: 7 lbs; Left: 30 lbs 08/23/2022:  Grip strength: Right: 7 lbs; Left: 30 lbs R grip 16 lbs; limited from PD but also by 5th digit PIP flexion contracture, as this digit can not engage in gripping (L 38 lbs) 07/12/2022: Grip strength: Right: 16 lbs; limited from PD but also by 5th digit PIP flexion contracture, as this digit still can not engage in gripping (L 40 lbs) Goal status: Improved  3.  Pt will improve R hand FMC/dexterity skills to be able to sign his name on medical/legal documents with 75% legibility, using adapted writing aids as needed. Baseline: 10/04/2022 R: 38 sec.  L: 36 sec. 50% legible for  name  10/04/2022: 08/25/2022: Improving writing, however this is dependent on whether or not he is have a bad day with tremors 08/23/2022: Improving writing, however this is dependent on whether or not he is have a bad day with tremors. Eval: <25% legible and pt verbalizes embarrassment when attempting to sign his name on forms 07/12/2022: 75% legibility when writing his name in print, however reports he is still not satisfied with his signature looks. Goal status: Improved  4.  Pt will increase R FMC/GMC skills to engage the RUE into UB ADLs at least 50% of the time. Baseline: 10/04/2022: Independent with donning UE clothing 08/25/2022: Pt. Is trying to engage his right UE more during daily ADL, and IADL tasks. Eval: Pt uses the L non-dominant arm for self care tasks. 08/23/2022: Pt. Is trying  to engage his right UE more during daily ADL, and IADL tasks. Eval: Pt uses the L non-dominant arm for self care tasks. 07/12/22: Pt. Reports that he has been trying to incorporate his R hand while still using the L arm to complete all self care tasks.  Goal status: Improved  5. Pt. Will improve typing skills  by 1 min.  In preparation for typing an email correspondence  Baseline: 10/04/2022: Pt. Typed 1 sentence in 1 min. &20 sec. 08/25/2022: Pt. Typed I sentence in 1 min. & 27 sec. With the left hand. Pt. Typed 1 sentence in 1 min. & 40 sec. With bilateral hands. Limited typing engagement with the right hand.  08/23/2022: Pt. Typed I sentence in 1 min. & 27 sec. With the left hand. Pt. Typed 1 sentence in 1 min. & 40 sec. With bilateral hands. Limited typing engagement with the right hand.    Goal status: Improved   ASSESSMENT:  CLINICAL IMPRESSION:  Pt. has started a [employer], and is now preparing to discharge from therapy services to focus to transition to focusing on the boxing program. Measurements were obtained, and goals were reviewed with the Pt. Pt. has made progress overall with bilateral UE ROM, strength, and [facility] skills. Pt. is utilizing compensatory strategies for proximal stabilization, and stability to improve, and promote distal control. Pt. Is now appropriate for discharge form OT services.   PERFORMANCE DEFICITS: in functional skills including ADLs, IADLs, coordination, dexterity, ROM, strength, flexibility, Fine motor control, Gross motor control, mobility, balance, body mechanics, endurance, decreased knowledge of use of DME, and UE functional use, cognitive skills including emotional and memory, and psychosocial skills including coping strategies, environmental adaptation, habits, and routines and behaviors.   IMPAIRMENTS: are limiting patient from ADLs, IADLs, and leisure.   CO-MORBIDITIES: has co-morbidities such as anxiety, depression  that affects  occupational performance. Patient will benefit from skilled OT to address above impairments and improve overall function.  MODIFICATION OR ASSISTANCE TO COMPLETE EVALUATION: No modification of tasks or assist necessary to complete an evaluation.  OT OCCUPATIONAL PROFILE AND HISTORY: Problem focused assessment: Including review of records relating to presenting problem.  CLINICAL DECISION MAKING: Moderate - several treatment options, min-mod task modification necessary  REHAB POTENTIAL: Good for goals  EVALUATION COMPLEXITY: Moderate    PLAN:  OT FREQUENCY: 2x/week  OT DURATION: 12 weeks  PLANNED INTERVENTIONS: self care/ADL training, therapeutic exercise, therapeutic activity, neuromuscular re-education, manual therapy, passive range of motion, balance training, functional mobility training, moist heat, cryotherapy, patient/family education, cognitive remediation/compensation, psychosocial skills training, energy conservation, coping strategies training, and DME and/or AE instructions RECOMMENDED OTHER SERVICES: None at this time  CONSULTED  AND AGREED WITH PLAN OF CARE: Patient  PLAN FOR NEXT SESSION: handwriting  Olegario Messier, M.S. OTR/L  10/04/22, 2:09 PM

## 2022-10-06 ENCOUNTER — Ambulatory Visit: Payer: Medicare Other

## 2022-10-06 ENCOUNTER — Ambulatory Visit: Payer: Medicare Other | Admitting: Physical Therapy

## 2022-10-11 ENCOUNTER — Ambulatory Visit: Payer: Medicare Other

## 2022-10-11 ENCOUNTER — Ambulatory Visit: Payer: Medicare Other | Admitting: Physical Therapy

## 2022-10-13 ENCOUNTER — Ambulatory Visit: Payer: Medicare Other | Admitting: Physical Therapy

## 2022-10-13 ENCOUNTER — Ambulatory Visit: Payer: Medicare Other | Admitting: Occupational Therapy

## 2022-10-18 ENCOUNTER — Ambulatory Visit: Payer: Medicare Other | Admitting: Physical Therapy

## 2022-10-20 ENCOUNTER — Ambulatory Visit: Payer: Medicare Other | Admitting: Physical Therapy

## 2022-10-25 ENCOUNTER — Encounter: Payer: Medicare Other | Admitting: Occupational Therapy

## 2022-10-25 ENCOUNTER — Ambulatory Visit: Payer: Medicare Other | Admitting: Physical Therapy

## 2022-10-27 ENCOUNTER — Encounter: Payer: Medicare Other | Admitting: Occupational Therapy

## 2022-10-27 ENCOUNTER — Ambulatory Visit: Payer: Medicare Other | Admitting: Physical Therapy

## 2022-11-01 ENCOUNTER — Encounter: Payer: Medicare Other | Admitting: Occupational Therapy

## 2022-11-01 ENCOUNTER — Ambulatory Visit: Payer: Medicare Other | Admitting: Physical Therapy

## 2022-11-03 ENCOUNTER — Ambulatory Visit: Payer: Medicare Other | Admitting: Physical Therapy

## 2022-11-03 ENCOUNTER — Encounter: Payer: Medicare Other | Admitting: Occupational Therapy

## 2022-11-08 ENCOUNTER — Ambulatory Visit: Payer: Medicare Other | Admitting: Physical Therapy

## 2022-11-08 ENCOUNTER — Encounter: Payer: Medicare Other | Admitting: Occupational Therapy

## 2022-11-10 ENCOUNTER — Ambulatory Visit: Payer: Medicare Other | Admitting: Physical Therapy

## 2022-11-15 ENCOUNTER — Encounter: Payer: Medicare Other | Admitting: Occupational Therapy

## 2022-11-15 ENCOUNTER — Ambulatory Visit: Payer: Medicare Other | Admitting: Physical Therapy

## 2022-11-17 ENCOUNTER — Encounter: Payer: Medicare Other | Admitting: Occupational Therapy

## 2022-11-22 ENCOUNTER — Ambulatory Visit: Payer: Medicare Other | Admitting: Physical Therapy

## 2022-11-22 ENCOUNTER — Encounter: Payer: Medicare Other | Admitting: Occupational Therapy

## 2022-11-24 ENCOUNTER — Ambulatory Visit: Payer: Medicare Other | Admitting: Physical Therapy

## 2022-11-24 ENCOUNTER — Encounter: Payer: Medicare Other | Admitting: Occupational Therapy

## 2022-11-29 ENCOUNTER — Ambulatory Visit: Payer: Medicare Other | Admitting: Physical Therapy

## 2022-11-29 ENCOUNTER — Encounter: Payer: Medicare Other | Admitting: Occupational Therapy

## 2022-12-06 ENCOUNTER — Ambulatory Visit: Payer: Medicare Other | Admitting: Physical Therapy

## 2022-12-06 ENCOUNTER — Encounter: Payer: Medicare Other | Admitting: Occupational Therapy

## 2022-12-08 ENCOUNTER — Encounter: Payer: Medicare Other | Admitting: Occupational Therapy

## 2022-12-08 ENCOUNTER — Ambulatory Visit: Payer: Medicare Other | Admitting: Physical Therapy

## 2022-12-13 ENCOUNTER — Ambulatory Visit: Payer: Medicare Other | Admitting: Physical Therapy

## 2022-12-13 ENCOUNTER — Encounter: Payer: Medicare Other | Admitting: Occupational Therapy

## 2022-12-15 ENCOUNTER — Ambulatory Visit: Payer: Medicare Other | Admitting: Physical Therapy

## 2022-12-15 ENCOUNTER — Encounter: Payer: Medicare Other | Admitting: Occupational Therapy

## 2022-12-20 ENCOUNTER — Encounter: Payer: Medicare Other | Admitting: Occupational Therapy

## 2022-12-20 ENCOUNTER — Ambulatory Visit: Payer: Medicare Other | Admitting: Physical Therapy

## 2022-12-22 ENCOUNTER — Ambulatory Visit: Payer: Medicare Other | Admitting: Physical Therapy

## 2022-12-22 ENCOUNTER — Encounter: Payer: Medicare Other | Admitting: Occupational Therapy

## 2022-12-29 ENCOUNTER — Encounter: Payer: Medicare Other | Admitting: Occupational Therapy

## 2022-12-29 ENCOUNTER — Ambulatory Visit: Payer: Medicare Other | Admitting: Physical Therapy

## 2023-01-03 ENCOUNTER — Ambulatory Visit: Payer: Medicare Other | Admitting: Physical Therapy

## 2023-01-03 ENCOUNTER — Encounter: Payer: Medicare Other | Admitting: Occupational Therapy

## 2023-01-05 ENCOUNTER — Ambulatory Visit: Payer: Medicare Other | Admitting: Physical Therapy

## 2023-01-05 ENCOUNTER — Encounter: Payer: Medicare Other | Admitting: Occupational Therapy

## 2023-01-10 ENCOUNTER — Encounter: Payer: Medicare Other | Admitting: Occupational Therapy

## 2023-01-10 ENCOUNTER — Ambulatory Visit: Payer: Medicare Other | Admitting: Physical Therapy

## 2023-01-12 ENCOUNTER — Ambulatory Visit: Payer: Medicare Other | Admitting: Physical Therapy

## 2023-01-12 ENCOUNTER — Encounter: Payer: Medicare Other | Admitting: Occupational Therapy

## 2023-01-17 ENCOUNTER — Encounter: Payer: Medicare Other | Admitting: Occupational Therapy

## 2023-01-17 ENCOUNTER — Ambulatory Visit: Payer: Medicare Other | Admitting: Physical Therapy

## 2023-01-19 ENCOUNTER — Encounter: Payer: Medicare Other | Admitting: Occupational Therapy

## 2023-01-19 ENCOUNTER — Ambulatory Visit: Payer: Medicare Other | Admitting: Physical Therapy

## 2023-01-24 ENCOUNTER — Ambulatory Visit: Payer: Medicare Other | Admitting: Physical Therapy

## 2023-01-24 ENCOUNTER — Encounter: Payer: Medicare Other | Admitting: Occupational Therapy

## 2023-01-26 ENCOUNTER — Ambulatory Visit: Payer: Medicare Other | Admitting: Physical Therapy

## 2023-01-26 ENCOUNTER — Encounter: Payer: Medicare Other | Admitting: Occupational Therapy

## 2023-01-31 ENCOUNTER — Encounter: Payer: Medicare Other | Admitting: Occupational Therapy

## 2023-01-31 ENCOUNTER — Ambulatory Visit: Payer: Medicare Other | Admitting: Physical Therapy

## 2023-02-02 ENCOUNTER — Encounter: Payer: Medicare Other | Admitting: Occupational Therapy

## 2023-02-02 ENCOUNTER — Ambulatory Visit: Payer: Medicare Other | Admitting: Physical Therapy

## 2023-02-07 ENCOUNTER — Ambulatory Visit: Payer: Medicare Other | Admitting: Physical Therapy

## 2023-02-07 ENCOUNTER — Encounter: Payer: Medicare Other | Admitting: Occupational Therapy

## 2023-02-09 ENCOUNTER — Encounter: Payer: Medicare Other | Admitting: Occupational Therapy

## 2023-02-09 ENCOUNTER — Ambulatory Visit: Payer: Medicare Other | Admitting: Physical Therapy

## 2023-02-14 ENCOUNTER — Ambulatory Visit: Payer: Medicare Other | Admitting: Physical Therapy

## 2023-02-14 ENCOUNTER — Encounter: Payer: Medicare Other | Admitting: Occupational Therapy

## 2023-02-16 ENCOUNTER — Ambulatory Visit: Payer: Medicare Other | Admitting: Physical Therapy

## 2023-02-16 ENCOUNTER — Encounter: Payer: Medicare Other | Admitting: Occupational Therapy

## 2023-07-25 ENCOUNTER — Encounter: Payer: Self-pay | Admitting: Ophthalmology

## 2023-07-26 ENCOUNTER — Encounter: Payer: Self-pay | Admitting: Ophthalmology

## 2023-07-26 NOTE — Anesthesia Preprocedure Evaluation (Addendum)
 Anesthesia Evaluation  Patient identified by MRN, date of birth, ID band Patient awake    Reviewed: Allergy & Precautions, H&P , NPO status , Patient's Chart, lab work & pertinent test results  Airway Mallampati: III  TM Distance: <3 FB Neck ROM: Full  Mouth opening: Limited Mouth Opening  Dental no notable dental hx.    Pulmonary neg pulmonary ROS   Pulmonary exam normal breath sounds clear to auscultation       Cardiovascular hypertension, + CAD and + Peripheral Vascular Disease  Normal cardiovascular exam Rhythm:Regular Rate:Normal  11-03-22  History: A-Fib Reason: LV function Indication: I48.0- Paroxysmal atrial fibrillation (CMS/HHS-HCC). I10- Essential (primary) hypertension. I34.0- Nonrheumatic mitral (valve) insufficiency.  CONCLUSION ------------------------------------------------------------------------------- NORMAL LEFT VENTRICULAR SYSTOLIC FUNCTION WITH MILD LVH ESTIMATED EF: >55%, CALC EF(2D): 60% INDETERMINATE DIASTOLIC FUNCTION NORMAL RIGHT VENTRICULAR SYSTOLIC FUNCTION VALVULAR REGURGITATION: TRIVIAL AR, MILD MR, TRIVIAL PR, MILD TR NO VALVULAR STENOSIS     Neuro/Psych  PSYCHIATRIC DISORDERS Anxiety Depression     Neuromuscular disease negative neurological ROS  negative psych ROS   GI/Hepatic negative GI ROS, Neg liver ROS, PUD,GERD  ,,  Endo/Other  negative endocrine ROSdiabetes    Renal/GU negative Renal ROS  negative genitourinary   Musculoskeletal negative musculoskeletal ROS (+)    Abdominal   Peds negative pediatric ROS (+)  Hematology negative hematology ROS (+) Blood dyscrasia, anemia   Anesthesia Other Findings  Hypertension  Parkinson's disease (HCC) has intermitten strong non-intention tremor both hands, do not note head tremor at this time Hyperlipemia  Anxiety Palpitations  Dry cough Anemia  Tremors of nervous system Colonic mass  PVD (peripheral vascular disease)  (HCC) Hypercholesteremia Abnormal liver function History of colon polyps  Dizziness Depression  Parkinson's disease  Atrial fibrillation   Tonsillitis Cancer Coronary artery disease Neuromuscular disorder  Parkinson's disease with dyskinesia without fluctuating manifestations     Reproductive/Obstetrics negative OB ROS                              Anesthesia Physical Anesthesia Plan  ASA: 3  Anesthesia Plan: MAC   Post-op Pain Management:    Induction: Intravenous  PONV Risk Score and Plan:   Airway Management Planned: Natural Airway and Nasal Cannula  Additional Equipment:   Intra-op Plan:   Post-operative Plan:   Informed Consent: I have reviewed the patients History and Physical, chart, labs and discussed the procedure including the risks, benefits and alternatives for the proposed anesthesia with the patient or authorized representative who has indicated his/her understanding and acceptance.     Dental Advisory Given  Plan Discussed with: Anesthesiologist, CRNA and Surgeon  Anesthesia Plan Comments: (Patient consented for risks of anesthesia including but not limited to:  - adverse reactions to medications - damage to eyes, teeth, lips or other oral mucosa - nerve damage due to positioning  - sore throat or hoarseness - Damage to heart, brain, nerves, lungs, other parts of body or loss of life  Patient voiced understanding and assent.)         Anesthesia Quick Evaluation

## 2023-07-31 NOTE — Discharge Instructions (Signed)

## 2023-08-02 ENCOUNTER — Ambulatory Visit: Payer: Self-pay | Admitting: Anesthesiology

## 2023-08-02 ENCOUNTER — Ambulatory Visit
Admission: RE | Admit: 2023-08-02 | Discharge: 2023-08-02 | Disposition: A | Discharge status: Home / Self Care | Attending: Ophthalmology | Admitting: Ophthalmology

## 2023-08-02 ENCOUNTER — Other Ambulatory Visit: Payer: Self-pay

## 2023-08-02 ENCOUNTER — Encounter: Payer: Self-pay | Admitting: Ophthalmology

## 2023-08-02 ENCOUNTER — Encounter
Admission: RE | Disposition: A | Discharge status: Home / Self Care | Payer: Self-pay | Source: Home / Self Care | Attending: Ophthalmology

## 2023-08-02 DIAGNOSIS — D649 Anemia, unspecified: Secondary | ICD-10-CM | POA: Insufficient documentation

## 2023-08-02 DIAGNOSIS — H2511 Age-related nuclear cataract, right eye: Secondary | ICD-10-CM | POA: Insufficient documentation

## 2023-08-02 DIAGNOSIS — I480 Paroxysmal atrial fibrillation: Secondary | ICD-10-CM | POA: Diagnosis not present

## 2023-08-02 DIAGNOSIS — Z8711 Personal history of peptic ulcer disease: Secondary | ICD-10-CM | POA: Diagnosis not present

## 2023-08-02 DIAGNOSIS — K219 Gastro-esophageal reflux disease without esophagitis: Secondary | ICD-10-CM | POA: Diagnosis not present

## 2023-08-02 DIAGNOSIS — I340 Nonrheumatic mitral (valve) insufficiency: Secondary | ICD-10-CM | POA: Insufficient documentation

## 2023-08-02 DIAGNOSIS — I10 Essential (primary) hypertension: Secondary | ICD-10-CM | POA: Diagnosis not present

## 2023-08-02 DIAGNOSIS — I2510 Atherosclerotic heart disease of native coronary artery without angina pectoris: Secondary | ICD-10-CM | POA: Insufficient documentation

## 2023-08-02 DIAGNOSIS — E1136 Type 2 diabetes mellitus with diabetic cataract: Secondary | ICD-10-CM | POA: Diagnosis not present

## 2023-08-02 DIAGNOSIS — D759 Disease of blood and blood-forming organs, unspecified: Secondary | ICD-10-CM | POA: Diagnosis not present

## 2023-08-02 DIAGNOSIS — E1151 Type 2 diabetes mellitus with diabetic peripheral angiopathy without gangrene: Secondary | ICD-10-CM | POA: Insufficient documentation

## 2023-08-02 HISTORY — DX: Parkinson's disease with dyskinesia, without mention of fluctuations: G20.B1

## 2023-08-02 HISTORY — PX: CATARACT EXTRACTION W/PHACO: SHX586

## 2023-08-02 HISTORY — DX: Atherosclerotic heart disease of native coronary artery without angina pectoris: I25.10

## 2023-08-02 HISTORY — DX: Myoneural disorder, unspecified: G70.9

## 2023-08-02 SURGERY — PHACOEMULSIFICATION, CATARACT, WITH IOL INSERTION
Anesthesia: Monitor Anesthesia Care | Laterality: Right

## 2023-08-02 MED ORDER — LIDOCAINE HCL (PF) 2 % IJ SOLN
INTRAOCULAR | Status: DC | PRN
  Administered 2023-08-02: 1 mL

## 2023-08-02 MED ORDER — CEFUROXIME OPHTHALMIC INJECTION 1 MG/0.1 ML
INJECTION | OPHTHALMIC | Status: DC | PRN
Start: 2023-08-02 — End: 2023-08-02
  Administered 2023-08-02: 1 mg via INTRACAMERAL

## 2023-08-02 MED ORDER — SIGHTPATH DOSE#1 BSS IO SOLN
INTRAOCULAR | Status: DC | PRN
Start: 2023-08-02 — End: 2023-08-02
  Administered 2023-08-02: 15 mL via INTRAOCULAR

## 2023-08-02 MED ORDER — ARMC OPHTHALMIC DILATING DROPS
OPHTHALMIC | Status: AC
  Filled 2023-08-02: qty 0.5

## 2023-08-02 MED ORDER — ARMC OPHTHALMIC DILATING DROPS
1.0000 | OPHTHALMIC | Status: DC | PRN
Start: 2023-08-02 — End: 2023-08-02
  Administered 2023-08-02 (×3): 1 via OPHTHALMIC

## 2023-08-02 MED ORDER — SIGHTPATH DOSE#1 NA HYALUR & NA CHOND-NA HYALUR IO KIT
PACK | INTRAOCULAR | Status: DC | PRN
  Administered 2023-08-02: 1 via OPHTHALMIC

## 2023-08-02 MED ORDER — TETRACAINE HCL 0.5 % OP SOLN
1.0000 [drp] | OPHTHALMIC | Status: DC | PRN
  Administered 2023-08-02 (×3): 1 [drp] via OPHTHALMIC

## 2023-08-02 MED ORDER — LACTATED RINGERS IV SOLN: INTRAVENOUS | Status: DC

## 2023-08-02 MED ORDER — BRIMONIDINE TARTRATE-TIMOLOL 0.2-0.5 % OP SOLN
OPHTHALMIC | Status: DC | PRN
  Administered 2023-08-02: 1 [drp] via OPHTHALMIC

## 2023-08-02 MED ORDER — FENTANYL CITRATE (PF) 100 MCG/2ML IJ SOLN
INTRAMUSCULAR | Status: AC
  Filled 2023-08-02: qty 2

## 2023-08-02 MED ORDER — MIDAZOLAM HCL 2 MG/2ML IJ SOLN
INTRAMUSCULAR | Status: AC
  Filled 2023-08-02: qty 2

## 2023-08-02 MED ORDER — MIDAZOLAM HCL 5 MG/5ML IJ SOLN
INTRAMUSCULAR | Status: DC | PRN
  Administered 2023-08-02: 1 mg via INTRAVENOUS

## 2023-08-02 MED ORDER — SIGHTPATH DOSE#1 BSS IO SOLN
INTRAOCULAR | Status: DC | PRN
  Administered 2023-08-02: 73 mL via OPHTHALMIC

## 2023-08-02 MED ORDER — FENTANYL CITRATE (PF) 100 MCG/2ML IJ SOLN
INTRAMUSCULAR | Status: DC | PRN
  Administered 2023-08-02: 50 ug via INTRAVENOUS

## 2023-08-02 MED ORDER — TETRACAINE HCL 0.5 % OP SOLN
OPHTHALMIC | Status: AC
  Filled 2023-08-02: qty 4

## 2023-08-02 SURGICAL SUPPLY — 10 items
CATARACT SUITE SIGHTPATH (MISCELLANEOUS) ×1 IMPLANT
FEE CATARACT SUITE SIGHTPATH (MISCELLANEOUS) ×1 IMPLANT
GLOVE BIOGEL PI IND STRL 8 (GLOVE) ×1 IMPLANT
GLOVE SURG LX STRL 7.5 STRW (GLOVE) ×1 IMPLANT
GLOVE SURG SYN 6.5 PF PI BL (GLOVE) ×1 IMPLANT
LENS IOL TECNIS EYHANCE 14.5 (Intraocular Lens) IMPLANT
NDL FILTER BLUNT 18X1 1/2 (NEEDLE) ×1 IMPLANT
NDL RETROBULBAR .5 NSTRL (NEEDLE) IMPLANT
NEEDLE FILTER BLUNT 18X1 1/2 (NEEDLE) ×1 IMPLANT
SYR 3ML LL SCALE MARK (SYRINGE) ×1 IMPLANT

## 2023-08-02 NOTE — H&P (Signed)
 [facility]   Primary Care Physician:  Sadie Manna, MD Ophthalmologist: Dr. Dene Etienne  Pre-Procedure History & Physical: HPI:  Charles Stanton is a 68 y.o. male here for ophthalmic surgery.   Past Medical History:  Diagnosis Date   Abnormal liver function    Anemia    Anxiety    Atrial fibrillation [facility])    sees Dr Carnella   Cancer [facility]) 2017   skin   Colonic mass    Coronary artery disease    A-Fib   Depression    Dizziness    Dry cough    History of colon polyps    Hypercholesteremia    Hyperlipemia    Hypertension    Neuromuscular disorder (HCC)    pt has parkinson's   Palpitations    Parkinson's disease (HCC)    Parkinson's disease (HCC)    Parkinson's disease with dyskinesia without fluctuating manifestations (HCC)    PVD (peripheral vascular disease) (HCC)    Tonsillitis 03/25/2013   Tremors of nervous system     Past Surgical History:  Procedure Laterality Date   BACK SURGERY     Kyphoplasty   March 2018   COLONOSCOPY     COLONOSCOPY WITH PROPOFOL  N/A 12/12/2017   Procedure: COLONOSCOPY WITH PROPOFOL ;  Surgeon: Therisa Bi, MD;  Location: [facility] ENDOSCOPY;  Service: Gastroenterology;  Laterality: N/A;   ESOPHAGOGASTRODUODENOSCOPY N/A 12/11/2017   Procedure: ESOPHAGOGASTRODUODENOSCOPY (EGD);  Surgeon: Therisa Bi, MD;  Location: [facility] ENDOSCOPY;  Service: Gastroenterology;  Laterality: N/A;   ESOPHAGOGASTRODUODENOSCOPY N/A 10/08/2021   Procedure: ESOPHAGOGASTRODUODENOSCOPY (EGD);  Surgeon: Maryruth Ole DASEN, MD;  Location: [facility] ENDOSCOPY;  Service: Endoscopy;  Laterality: N/A;   ESOPHAGOGASTRODUODENOSCOPY (EGD) WITH PROPOFOL  N/A 09/21/2016   Procedure: ESOPHAGOGASTRODUODENOSCOPY (EGD) WITH PROPOFOL ;  Surgeon: Therisa Bi, MD;  Location: [facility] ENDOSCOPY;  Service: Gastroenterology;  Laterality: N/A;   ESOPHAGOGASTRODUODENOSCOPY (EGD) WITH PROPOFOL  N/A 11/07/2016   Procedure: ESOPHAGOGASTRODUODENOSCOPY (EGD) WITH PROPOFOL ;  Surgeon: Therisa Bi, MD;  Location: [facility] ENDOSCOPY;  Service: Gastroenterology;  Laterality: N/A;   ESOPHAGOGASTRODUODENOSCOPY (EGD) WITH PROPOFOL  N/A 10/15/2019   Procedure: ESOPHAGOGASTRODUODENOSCOPY (EGD) WITH PROPOFOL ;  Surgeon: Maryruth Ole DASEN, MD;  Location: [facility] ENDOSCOPY;  Service: Endoscopy;  Laterality: N/A;   ESOPHAGOGASTRODUODENOSCOPY (EGD) WITH PROPOFOL  N/A 10/28/2019   Procedure: ESOPHAGOGASTRODUODENOSCOPY (EGD) WITH PROPOFOL ;  Surgeon: Maryruth Ole DASEN, MD;  Location: [facility] ENDOSCOPY;  Service: Endoscopy;  Laterality: N/A;   ESOPHAGOGASTRODUODENOSCOPY (EGD) WITH PROPOFOL  N/A 12/02/2019   Procedure: ESOPHAGOGASTRODUODENOSCOPY (EGD) WITH PROPOFOL ;  Surgeon: Maryruth Ole DASEN, MD;  Location: [facility] ENDOSCOPY;  Service: Endoscopy;  Laterality: N/A;   ESOPHAGOGASTRODUODENOSCOPY (EGD) WITH PROPOFOL  N/A 02/18/2020   Procedure: ESOPHAGOGASTRODUODENOSCOPY (EGD) WITH PROPOFOL ;  Surgeon: Maryruth Ole DASEN, MD;  Location: [facility] ENDOSCOPY;  Service: Endoscopy;  Laterality: N/A;   ESOPHAGOGASTRODUODENOSCOPY (EGD) WITH PROPOFOL  N/A 08/02/2021   Procedure: ESOPHAGOGASTRODUODENOSCOPY (EGD) WITH PROPOFOL ;  Surgeon: Maryruth Ole DASEN, MD;  Location: [facility] ENDOSCOPY;  Service: Endoscopy;  Laterality: N/A;  REQUEST AM   ESOPHAGOGASTRODUODENOSCOPY (EGD) WITH PROPOFOL  N/A 10/05/2021   Procedure: ESOPHAGOGASTRODUODENOSCOPY (EGD) WITH PROPOFOL ;  Surgeon: Maryruth Ole DASEN, MD;  Location: [facility] ENDOSCOPY;  Service: Endoscopy;  Laterality: N/A;   FRACTURE SURGERY Right 05/28/2019   distal radius fracture   HERNIA REPAIR     left inguinal hernia repair as an infant   KYPHOPLASTY N/A 02/11/2016   Procedure: KYPHOPLASTY  L2,T9;  Surgeon: Ozell Flake, MD;  Location: [facility] ORS;  Service: Orthopedics;  Laterality: N/A;   KYPHOPLASTY N/A 03/15/2016   Procedure: KYPHOPLASTY L3;  Surgeon:  Ozell Flake, MD;  Location: [facility] ORS;  Service: Orthopedics;  Laterality: N/A;   OPEN REDUCTION INTERNAL FIXATION (ORIF) DISTAL RADIAL  FRACTURE Right 05/28/2019   Procedure: OPEN REDUCTION INTERNAL FIXATION (ORIF) DISTAL RADIAL FRACTURE;  Surgeon: Flake Ozell, MD;  Location: [facility] ORS;  Service: Orthopedics;  Laterality: Right;   skin cancer removed     TONSILLECTOMY      Prior to Admission medications   Medication Sig Start Date End Date Taking? Authorizing Provider  Apixaban (ELIQUIS PO) Take by mouth.   Yes [provider]  carbidopa -levodopa  (SINEMET  IR) 25-100 MG tablet TK 1 T PO four times a day 08/16/16  Yes [provider]  Carbidopa -Levodopa  ER (RYTARY ) 23.75-95 MG CPCR Take 1 capsule by mouth daily.   Yes [provider]  cholecalciferol  (VITAMIN D ) 25 MCG tablet Take 1 tablet (1,000 Units total) by mouth daily. 06/02/19  Yes Dickie Begun, MD  ferrous sulfate 325 (65 FE) MG EC tablet Take 325 mg by mouth 1 day or 1 dose.   Yes [provider]  pantoprazole  (PROTONIX ) 40 MG tablet Take 1 tablet (40 mg total) by mouth 2 (two) times daily. 12/12/17  Yes Patel, Sona, MD  vitamin B-12 (CYANOCOBALAMIN ) 100 MCG tablet Take 100 mcg by mouth daily.   Yes [provider]    Allergies as of 06/12/2023 - Review Complete 10/04/2022  Allergen Reaction Noted   Amantadine Other (See Comments) 02/05/2014    Family History  Problem Relation Age of Onset   Heart Problems Mother    Dementia Mother     Social History   Socioeconomic History   Marital status: Married    Spouse name: Not on file   Number of children: Not on file   Years of education: Not on file   Highest education level: Not on file  Occupational History   Not on file  Tobacco Use   Smoking status: Never   Smokeless tobacco: Never  Vaping Use   Vaping status: Never Used  Substance and Sexual Activity   Alcohol use: Yes    Alcohol/week: 2.0 standard drinks of alcohol    Types: 2 Shots of liquor per week    Comment: Daily 2 shots of whiskey   Drug use: No   Sexual activity: Yes  Other Topics Concern    Not on file  Social History Narrative   Not on file   Social Drivers of Health   Financial Resource Strain: Low Risk  (08/01/2023)   Received from [facility]   Overall Financial Resource Strain (CARDIA)    Difficulty of Paying Living Expenses: Not hard at all  Food Insecurity: No Food Insecurity (08/01/2023)   Received from [facility]   Hunger Vital Sign    Within the past 12 months, you worried that your food would run out before you got the money to buy more.: Never true    Within the past 12 months, the food you bought just didn't last and you didn't have money to get more.: Never true  Transportation Needs: No Transportation Needs (08/01/2023)   Received from [facility] - Transportation    In the past 12 months, has lack of transportation kept you from medical appointments or from getting medications?: No    Lack of Transportation (Non-Medical): No  Physical Activity: Not on file  Stress: Not on file  Social Connections: Not on file  Intimate Partner Violence: Not on file  Review of Systems: See HPI, otherwise negative ROS  Physical Exam: BP (!) 170/68   Temp 98.5 F (36.9 C)   Resp 14   Ht 5' 5.98 (1.676 m)   Wt 58.2 kg   SpO2 100%   BMI 20.70 kg/m  General:   Alert,  pleasant and cooperative in NAD Head:  Normocephalic and atraumatic. Lungs:  Clear to auscultation.    Heart:  Regular rate and rhythm.   Impression/Plan: Charles Stanton is here for ophthalmic surgery.  Risks, benefits, limitations, and alternatives regarding ophthalmic surgery have been reviewed with the patient.  Questions have been answered.  All parties agreeable.   Charles GASKIN, MD  08/02/2023, 10:15 AM

## 2023-08-02 NOTE — Transfer of Care (Signed)
 Immediate Anesthesia Transfer of Care Note  Patient: Charles Stanton  Procedure(s) Performed: PHACOEMULSIFICATION, CATARACT, WITH IOL INSERTION 6.97, 00.50.6 (Right)  Patient Location: PACU  Anesthesia Type: MAC  Level of Consciousness: awake, alert  and patient cooperative  Airway and Oxygen Therapy: Patient Spontanous Breathing and Patient connected to supplemental oxygen  Post-op Assessment: Post-op Vital signs reviewed, Patient's Cardiovascular Status Stable, Respiratory Function Stable, Patent Airway and No signs of Nausea or vomiting  Post-op Vital Signs: Reviewed and stable  Complications: No notable events documented.

## 2023-08-02 NOTE — Op Note (Signed)
 LOCATION:  [facility]   PREOPERATIVE DIAGNOSIS:    Nuclear sclerotic cataract right eye. H25.11   POSTOPERATIVE DIAGNOSIS:  Nuclear sclerotic cataract right eye.     PROCEDURE:  Phacoemusification with posterior chamber intraocular lens placement of the right eye   ULTRASOUND TIME: Procedure(s): PHACOEMULSIFICATION, CATARACT, WITH IOL INSERTION 6.97, 00.50.6 (Right)  LENS:   Implant Name Type Inv. Item Serial No. Manufacturer Lot No. LRB No. Used Action  LENS IOL TECNIS EYHANCE 14.5 - [id number] Intraocular Lens LENS IOL TECNIS EYHANCE 14.5 [id number] [employer]  Right 1 Implanted         SURGEON:  Dene FABIENE Etienne, MD   ANESTHESIA:  Topical with tetracaine  drops and 2% Xylocaine  jelly, augmented with 1% preservative-free intracameral lidocaine .    COMPLICATIONS:  None.   DESCRIPTION OF PROCEDURE:  The patient was identified in the holding room and transported to the operating room and placed in the supine position under the operating microscope.  The right eye was identified as the operative eye and it was prepped and draped in the usual sterile ophthalmic fashion.   A 1 millimeter clear-corneal paracentesis was made at the 12:00 position.  0.5 ml of preservative-free 1% lidocaine  was injected into the anterior chamber. The anterior chamber was filled with Viscoat viscoelastic.  A 2.4 millimeter keratome was used to make a near-clear corneal incision at the 9:00 position.  A curvilinear capsulorrhexis was made with a cystotome and capsulorrhexis forceps.  Balanced salt solution was used to hydrodissect and hydrodelineate the nucleus.   Phacoemulsification was then used in stop and chop fashion to remove the lens nucleus and epinucleus.  The remaining cortex was then removed using the irrigation and aspiration handpiece. Provisc was then placed into the capsular bag to distend it for lens placement.  A lens was then injected into the capsular bag.  The remaining  viscoelastic was aspirated.   Wounds were hydrated with balanced salt solution.  The anterior chamber was inflated to a physiologic pressure with balanced salt solution.  No wound leaks were noted. Cefuroxime  0.1 ml of a 10mg /ml solution was injected into the anterior chamber for a dose of 1 mg of intracameral antibiotic at the completion of the case.   Timolol  and Brimonidine  drops were applied to the eye.  The patient was taken to the recovery room in stable condition without complications of anesthesia or surgery.   Annabella Elford 08/02/2023, 11:04 AM

## 2023-08-03 ENCOUNTER — Encounter: Payer: Self-pay | Admitting: Ophthalmology

## 2023-08-04 NOTE — Anesthesia Postprocedure Evaluation (Signed)
 Anesthesia Post Note  Patient: Charles Stanton  Procedure(s) Performed: PHACOEMULSIFICATION, CATARACT, WITH IOL INSERTION 6.97, 00.50.6 (Right)  Patient location during evaluation: PACU Anesthesia Type: MAC Level of consciousness: awake and alert Pain management: pain level controlled Vital Signs Assessment: post-procedure vital signs reviewed and stable Respiratory status: spontaneous breathing, nonlabored ventilation, respiratory function stable and patient connected to nasal cannula oxygen Cardiovascular status: stable and blood pressure returned to baseline Postop Assessment: no apparent nausea or vomiting Anesthetic complications: no   No notable events documented.   Last Vitals:  Vitals:   08/02/23 1105 08/02/23 1111  BP:  (!) 150/80  Pulse: 75 74  Resp: 20   Temp: 36.5 C (!) 36.4 C  SpO2: 98%     Last Pain:  Vitals:   08/02/23 1111  PainSc: 0-No pain                 Tiffiny Worthy C Matilde Markie

## 2023-08-07 NOTE — Anesthesia Preprocedure Evaluation (Addendum)
 Anesthesia Evaluation  Patient identified by MRN, date of birth, ID band Patient awake    Reviewed: Allergy & Precautions, H&P , NPO status , Patient's Chart, lab work & pertinent test results  Airway Mallampati: III  TM Distance: <3 FB Neck ROM: Full    Dental no notable dental hx.    Pulmonary neg pulmonary ROS   Pulmonary exam normal breath sounds clear to auscultation       Cardiovascular hypertension, + CAD and + Peripheral Vascular Disease  Normal cardiovascular exam Rhythm:Regular Rate:Normal  11-03-22   History: A-Fib Reason: LV function Indication: I48.0- Paroxysmal atrial fibrillation (CMS/HHS-HCC). I10- Essential (primary) hypertension. I34.0- Nonrheumatic mitral (valve) insufficiency.   CONCLUSION ------------------------------------------------------------------------------- NORMAL LEFT VENTRICULAR SYSTOLIC FUNCTION WITH MILD LVH ESTIMATED EF: >55%, CALC EF(2D): 60% INDETERMINATE DIASTOLIC FUNCTION NORMAL RIGHT VENTRICULAR SYSTOLIC FUNCTION VALVULAR REGURGITATION: TRIVIAL AR, MILD MR, TRIVIAL PR, MILD TR NO VALVULAR STENOSIS       Neuro/Psych  PSYCHIATRIC DISORDERS Anxiety Depression     Neuromuscular disease negative neurological ROS  negative psych ROS   GI/Hepatic negative GI ROS, Neg liver ROS, PUD,GERD  ,,  Endo/Other  negative endocrine ROSdiabetes    Renal/GU negative Renal ROS  negative genitourinary   Musculoskeletal negative musculoskeletal ROS (+)    Abdominal   Peds negative pediatric ROS (+)  Hematology negative hematology ROS (+) Blood dyscrasia, anemia   Anesthesia Other Findings Previous cataract surgery 08-02-23 Dr. Ola  Severe tremor today, both arms Hypertension Parkinson's disease (HCC) Hyperlipemia Anxiety Palpitations Dry cough Anemia Tremors of nervous system Colonic mass PVD (peripheral vascular disease) (HCC) Hypercholesteremia Abnormal liver function History  of colon polyps Dizziness Depression Parkinson's disease (HCC) Atrial fibrillation (HCC) Tonsillitis Cancer (HCC) Coronary artery disease Neuromuscular disorder (HCC) Parkinson's disease with dyskinesia without fluctuating manifestations     Reproductive/Obstetrics negative OB ROS                              Anesthesia Physical Anesthesia Plan  ASA: 3  Anesthesia Plan: MAC   Post-op Pain Management:    Induction: Intravenous  PONV Risk Score and Plan:   Airway Management Planned: Natural Airway and Nasal Cannula  Additional Equipment:   Intra-op Plan:   Post-operative Plan:   Informed Consent: I have reviewed the patients History and Physical, chart, labs and discussed the procedure including the risks, benefits and alternatives for the proposed anesthesia with the patient or authorized representative who has indicated his/her understanding and acceptance.     Dental Advisory Given  Plan Discussed with: Anesthesiologist, CRNA and Surgeon  Anesthesia Plan Comments: (Patient consented for risks of anesthesia including but not limited to:  - adverse reactions to medications - damage to eyes, teeth, lips or other oral mucosa - nerve damage due to positioning  - sore throat or hoarseness - Damage to heart, brain, nerves, lungs, other parts of body or loss of life  Patient voiced understanding and assent.)         Anesthesia Quick Evaluation

## 2023-08-10 NOTE — Discharge Instructions (Signed)

## 2023-08-16 ENCOUNTER — Encounter: Payer: Self-pay | Admitting: Ophthalmology

## 2023-08-16 ENCOUNTER — Encounter
Admission: RE | Disposition: A | Discharge status: Home / Self Care | Payer: Self-pay | Source: Home / Self Care | Attending: Ophthalmology

## 2023-08-16 ENCOUNTER — Ambulatory Visit: Payer: Self-pay | Admitting: Anesthesiology

## 2023-08-16 ENCOUNTER — Other Ambulatory Visit: Payer: Self-pay

## 2023-08-16 ENCOUNTER — Ambulatory Visit
Admission: RE | Admit: 2023-08-16 | Discharge: 2023-08-16 | Disposition: A | Discharge status: Home / Self Care | Attending: Ophthalmology | Admitting: Ophthalmology

## 2023-08-16 DIAGNOSIS — I2510 Atherosclerotic heart disease of native coronary artery without angina pectoris: Secondary | ICD-10-CM | POA: Diagnosis not present

## 2023-08-16 DIAGNOSIS — I480 Paroxysmal atrial fibrillation: Secondary | ICD-10-CM | POA: Insufficient documentation

## 2023-08-16 DIAGNOSIS — I739 Peripheral vascular disease, unspecified: Secondary | ICD-10-CM | POA: Diagnosis not present

## 2023-08-16 DIAGNOSIS — H2512 Age-related nuclear cataract, left eye: Secondary | ICD-10-CM | POA: Diagnosis present

## 2023-08-16 DIAGNOSIS — I10 Essential (primary) hypertension: Secondary | ICD-10-CM | POA: Diagnosis not present

## 2023-08-16 DIAGNOSIS — I340 Nonrheumatic mitral (valve) insufficiency: Secondary | ICD-10-CM | POA: Diagnosis not present

## 2023-08-16 DIAGNOSIS — Z8711 Personal history of peptic ulcer disease: Secondary | ICD-10-CM | POA: Insufficient documentation

## 2023-08-16 DIAGNOSIS — D649 Anemia, unspecified: Secondary | ICD-10-CM | POA: Insufficient documentation

## 2023-08-16 DIAGNOSIS — G709 Myoneural disorder, unspecified: Secondary | ICD-10-CM | POA: Diagnosis not present

## 2023-08-16 DIAGNOSIS — K219 Gastro-esophageal reflux disease without esophagitis: Secondary | ICD-10-CM | POA: Diagnosis not present

## 2023-08-16 DIAGNOSIS — Z7901 Long term (current) use of anticoagulants: Secondary | ICD-10-CM | POA: Insufficient documentation

## 2023-08-16 HISTORY — PX: CATARACT EXTRACTION W/PHACO: SHX586

## 2023-08-16 SURGERY — PHACOEMULSIFICATION, CATARACT, WITH IOL INSERTION
Anesthesia: Monitor Anesthesia Care | Laterality: Left

## 2023-08-16 MED ORDER — SIGHTPATH DOSE#1 BSS IO SOLN
INTRAOCULAR | Status: DC | PRN
  Administered 2023-08-16 (×2): 62 mL via OPHTHALMIC

## 2023-08-16 MED ORDER — TETRACAINE HCL 0.5 % OP SOLN
1.0000 [drp] | OPHTHALMIC | Status: DC | PRN
  Administered 2023-08-16 (×6): 1 [drp] via OPHTHALMIC

## 2023-08-16 MED ORDER — BRIMONIDINE TARTRATE-TIMOLOL 0.2-0.5 % OP SOLN
OPHTHALMIC | Status: DC | PRN
  Administered 2023-08-16 (×2): 1 [drp] via OPHTHALMIC

## 2023-08-16 MED ORDER — ARMC OPHTHALMIC DILATING DROPS
1.0000 | OPHTHALMIC | Status: DC | PRN
  Administered 2023-08-16 (×6): 1 via OPHTHALMIC

## 2023-08-16 MED ORDER — SIGHTPATH DOSE#1 NA HYALUR & NA CHOND-NA HYALUR IO KIT
PACK | INTRAOCULAR | Status: DC | PRN
  Administered 2023-08-16 (×2): 1 via OPHTHALMIC

## 2023-08-16 MED ORDER — FENTANYL CITRATE (PF) 100 MCG/2ML IJ SOLN
INTRAMUSCULAR | Status: DC | PRN
  Administered 2023-08-16 (×4): 50 ug via INTRAVENOUS

## 2023-08-16 MED ORDER — FENTANYL CITRATE (PF) 100 MCG/2ML IJ SOLN
INTRAMUSCULAR | Status: AC
  Filled 2023-08-16: qty 2

## 2023-08-16 MED ORDER — MIDAZOLAM HCL 2 MG/2ML IJ SOLN
INTRAMUSCULAR | Status: AC
  Filled 2023-08-16: qty 2

## 2023-08-16 MED ORDER — MOXIFLOXACIN HCL 0.5 % OP SOLN
OPHTHALMIC | Status: DC | PRN
  Administered 2023-08-16 (×2): .2 mL via OPHTHALMIC

## 2023-08-16 MED ORDER — TETRACAINE HCL 0.5 % OP SOLN
OPHTHALMIC | Status: AC
  Filled 2023-08-16: qty 4

## 2023-08-16 MED ORDER — LIDOCAINE HCL (PF) 2 % IJ SOLN
INTRAOCULAR | Status: DC | PRN
  Administered 2023-08-16 (×2): 1 mL

## 2023-08-16 MED ORDER — ARMC OPHTHALMIC DILATING DROPS
OPHTHALMIC | Status: AC
Start: 2023-08-16 — End: 2023-08-16
  Filled 2023-08-16: qty 0.5

## 2023-08-16 MED ORDER — SIGHTPATH DOSE#1 BSS IO SOLN
INTRAOCULAR | Status: DC | PRN
  Administered 2023-08-16 (×2): 15 mL via INTRAOCULAR

## 2023-08-16 MED ORDER — LACTATED RINGERS IV SOLN: INTRAVENOUS | Status: DC

## 2023-08-16 MED ORDER — MIDAZOLAM HCL 2 MG/2ML IJ SOLN
INTRAMUSCULAR | Status: DC | PRN
  Administered 2023-08-16 (×4): 1 mg via INTRAVENOUS

## 2023-08-16 SURGICAL SUPPLY — 16 items
BNDG EYE OVAL 2 1/8 X 2 5/8 (GAUZE/BANDAGES/DRESSINGS) IMPLANT
CANNULA ANT/CHMB 27G (MISCELLANEOUS) IMPLANT
FEE CATARACT SUITE SIGHTPATH (MISCELLANEOUS) ×1 IMPLANT
GLOVE BIOGEL PI IND STRL 8 (GLOVE) ×1 IMPLANT
GLOVE PI ULTRA LF STRL 7.5 (GLOVE) IMPLANT
GLOVE SURG LX STRL 7.5 STRW (GLOVE) ×1 IMPLANT
GLOVE SURG SYN 6.5 PF PI BL (GLOVE) ×1 IMPLANT
LENS IOL TECNIS EYHANCE 14.5 (Intraocular Lens) IMPLANT
NDL FILTER BLUNT 18X1 1/2 (NEEDLE) ×1 IMPLANT
NDL RETROBULBAR .5 NSTRL (NEEDLE) IMPLANT
NEEDLE FILTER BLUNT 18X1 1/2 (NEEDLE) ×1 IMPLANT
PACK VIT ANT 23G (MISCELLANEOUS) IMPLANT
RING MALYGIN 7.0 (MISCELLANEOUS) IMPLANT
SUT VICRYL 9 0 (SUTURE) IMPLANT
SUTURE EHLN 10-0 CS-B-6CS-B-6 (SUTURE) IMPLANT
SYR 3ML LL SCALE MARK (SYRINGE) ×1 IMPLANT

## 2023-08-16 NOTE — H&P (Signed)
 [facility]   Primary Care Physician:  Sadie Manna, MD Ophthalmologist: Dr. Dene Etienne  Pre-Procedure History & Physical: HPI:  Charles Stanton is a 68 y.o. male here for ophthalmic surgery.   Past Medical History:  Diagnosis Date   Abnormal liver function    Anemia    Anxiety    Atrial fibrillation [facility])    sees Dr Carnella   Cancer [facility]) 2017   skin   Colonic mass    Coronary artery disease    A-Fib   Depression    Dizziness    Dry cough    History of colon polyps    Hypercholesteremia    Hyperlipemia    Hypertension    Neuromuscular disorder (HCC)    pt has parkinson's   Palpitations    Parkinson's disease (HCC)    Parkinson's disease (HCC)    Parkinson's disease with dyskinesia without fluctuating manifestations (HCC)    PVD (peripheral vascular disease) (HCC)    Tonsillitis 03/25/2013   Tremors of nervous system     Past Surgical History:  Procedure Laterality Date   BACK SURGERY     Kyphoplasty   March 2018   CATARACT EXTRACTION W/PHACO Right 08/02/2023   Procedure: PHACOEMULSIFICATION, CATARACT, WITH IOL INSERTION 6.97, 00.50.6;  Surgeon: Etienne Dene, MD;  Location: [facility];  Service: Ophthalmology;  Laterality: Right;   COLONOSCOPY     COLONOSCOPY WITH PROPOFOL  N/A 12/12/2017   Procedure: COLONOSCOPY WITH PROPOFOL ;  Surgeon: Therisa Bi, MD;  Location: [facility] ENDOSCOPY;  Service: Gastroenterology;  Laterality: N/A;   ESOPHAGOGASTRODUODENOSCOPY N/A 12/11/2017   Procedure: ESOPHAGOGASTRODUODENOSCOPY (EGD);  Surgeon: Therisa Bi, MD;  Location: [facility] ENDOSCOPY;  Service: Gastroenterology;  Laterality: N/A;   ESOPHAGOGASTRODUODENOSCOPY N/A 10/08/2021   Procedure: ESOPHAGOGASTRODUODENOSCOPY (EGD);  Surgeon: Maryruth Ole DASEN, MD;  Location: [facility] ENDOSCOPY;  Service: Endoscopy;  Laterality: N/A;   ESOPHAGOGASTRODUODENOSCOPY (EGD) WITH PROPOFOL  N/A 09/21/2016   Procedure: ESOPHAGOGASTRODUODENOSCOPY (EGD) WITH PROPOFOL ;   Surgeon: Therisa Bi, MD;  Location: [facility] ENDOSCOPY;  Service: Gastroenterology;  Laterality: N/A;   ESOPHAGOGASTRODUODENOSCOPY (EGD) WITH PROPOFOL  N/A 11/07/2016   Procedure: ESOPHAGOGASTRODUODENOSCOPY (EGD) WITH PROPOFOL ;  Surgeon: Therisa Bi, MD;  Location: [facility] ENDOSCOPY;  Service: Gastroenterology;  Laterality: N/A;   ESOPHAGOGASTRODUODENOSCOPY (EGD) WITH PROPOFOL  N/A 10/15/2019   Procedure: ESOPHAGOGASTRODUODENOSCOPY (EGD) WITH PROPOFOL ;  Surgeon: Maryruth Ole DASEN, MD;  Location: [facility] ENDOSCOPY;  Service: Endoscopy;  Laterality: N/A;   ESOPHAGOGASTRODUODENOSCOPY (EGD) WITH PROPOFOL  N/A 10/28/2019   Procedure: ESOPHAGOGASTRODUODENOSCOPY (EGD) WITH PROPOFOL ;  Surgeon: Maryruth Ole DASEN, MD;  Location: [facility] ENDOSCOPY;  Service: Endoscopy;  Laterality: N/A;   ESOPHAGOGASTRODUODENOSCOPY (EGD) WITH PROPOFOL  N/A 12/02/2019   Procedure: ESOPHAGOGASTRODUODENOSCOPY (EGD) WITH PROPOFOL ;  Surgeon: Maryruth Ole DASEN, MD;  Location: [facility] ENDOSCOPY;  Service: Endoscopy;  Laterality: N/A;   ESOPHAGOGASTRODUODENOSCOPY (EGD) WITH PROPOFOL  N/A 02/18/2020   Procedure: ESOPHAGOGASTRODUODENOSCOPY (EGD) WITH PROPOFOL ;  Surgeon: Maryruth Ole DASEN, MD;  Location: [facility] ENDOSCOPY;  Service: Endoscopy;  Laterality: N/A;   ESOPHAGOGASTRODUODENOSCOPY (EGD) WITH PROPOFOL  N/A 08/02/2021   Procedure: ESOPHAGOGASTRODUODENOSCOPY (EGD) WITH PROPOFOL ;  Surgeon: Maryruth Ole DASEN, MD;  Location: [facility] ENDOSCOPY;  Service: Endoscopy;  Laterality: N/A;  REQUEST AM   ESOPHAGOGASTRODUODENOSCOPY (EGD) WITH PROPOFOL  N/A 10/05/2021   Procedure: ESOPHAGOGASTRODUODENOSCOPY (EGD) WITH PROPOFOL ;  Surgeon: Maryruth Ole DASEN, MD;  Location: [facility] ENDOSCOPY;  Service: Endoscopy;  Laterality: N/A;   FRACTURE SURGERY Right 05/28/2019   distal radius fracture   HERNIA REPAIR     left inguinal hernia repair as an infant   KYPHOPLASTY N/A 02/11/2016  Procedure: KYPHOPLASTY  L2,T9;  Surgeon: Ozell Flake, MD;  Location: [facility] ORS;  Service:  Orthopedics;  Laterality: N/A;   KYPHOPLASTY N/A 03/15/2016   Procedure: KYPHOPLASTY L3;  Surgeon: Ozell Flake, MD;  Location: [facility] ORS;  Service: Orthopedics;  Laterality: N/A;   OPEN REDUCTION INTERNAL FIXATION (ORIF) DISTAL RADIAL FRACTURE Right 05/28/2019   Procedure: OPEN REDUCTION INTERNAL FIXATION (ORIF) DISTAL RADIAL FRACTURE;  Surgeon: Flake Ozell, MD;  Location: [facility] ORS;  Service: Orthopedics;  Laterality: Right;   skin cancer removed     TONSILLECTOMY      Prior to Admission medications   Medication Sig Start Date End Date Taking? Authorizing Provider  Apixaban (ELIQUIS PO) Take by mouth.   Yes [provider]  carbidopa -levodopa  (SINEMET  IR) 25-100 MG tablet TK 1 T PO four times a day 08/16/16  Yes [provider]  Carbidopa -Levodopa  ER (RYTARY ) 23.75-95 MG CPCR Take 1 capsule by mouth daily.   Yes [provider]  cholecalciferol  (VITAMIN D ) 25 MCG tablet Take 1 tablet (1,000 Units total) by mouth daily. 06/02/19  Yes Dickie Begun, MD  ferrous sulfate 325 (65 FE) MG EC tablet Take 325 mg by mouth 1 day or 1 dose.   Yes [provider]  pantoprazole  (PROTONIX ) 40 MG tablet Take 1 tablet (40 mg total) by mouth 2 (two) times daily. 12/12/17  Yes Patel, Sona, MD  vitamin B-12 (CYANOCOBALAMIN ) 100 MCG tablet Take 100 mcg by mouth daily.   Yes [provider]    Allergies as of 06/12/2023 - Review Complete 10/04/2022  Allergen Reaction Noted   Amantadine Other (See Comments) 02/05/2014    Family History  Problem Relation Age of Onset   Heart Problems Mother    Dementia Mother     Social History   Socioeconomic History   Marital status: Married    Spouse name: Not on file   Number of children: Not on file   Years of education: Not on file   Highest education level: Not on file  Occupational History   Not on file  Tobacco Use   Smoking status: Never   Smokeless tobacco: Never  Vaping Use   Vaping status: Never Used   Substance and Sexual Activity   Alcohol use: Yes    Alcohol/week: 2.0 standard drinks of alcohol    Types: 2 Shots of liquor per week    Comment: Daily 2 shots of whiskey   Drug use: No   Sexual activity: Yes  Other Topics Concern   Not on file  Social History Narrative   Not on file   Social Drivers of Health   Financial Resource Strain: Low Risk  (08/01/2023)   Received from [facility]   Overall Financial Resource Strain (CARDIA)    Difficulty of Paying Living Expenses: Not hard at all  Food Insecurity: No Food Insecurity (08/01/2023)   Received from [facility]   Hunger Vital Sign    Within the past 12 months, you worried that your food would run out before you got the money to buy more.: Never true    Within the past 12 months, the food you bought just didn't last and you didn't have money to get more.: Never true  Transportation Needs: No Transportation Needs (08/01/2023)   Received from [facility] Transportation    In the past 12 months, has lack of transportation kept you from medical appointments or from getting medications?: No  Lack of Transportation (Non-Medical): No  Physical Activity: Not on file  Stress: Not on file  Social Connections: Not on file  Intimate Partner Violence: Not on file    Review of Systems: See HPI, otherwise negative ROS  Physical Exam: BP (!) 158/87   Pulse 67   Temp 98.4 F (36.9 C) (Temporal)   Resp 15   Ht 5' 5.98 (1.676 m)   Wt 60.8 kg   SpO2 100%   BMI 21.64 kg/m  General:   Alert,  pleasant and cooperative in NAD Head:  Normocephalic and atraumatic. Lungs:  Clear to auscultation.    Heart:  Regular rate and rhythm.   Impression/Plan: Charles Stanton is here for ophthalmic surgery.  Risks, benefits, limitations, and alternatives regarding ophthalmic surgery have been reviewed with the patient.  Questions have been answered.  All parties  agreeable.   MITTIE GASKIN, MD  08/16/2023, 7:31 AM

## 2023-08-16 NOTE — Transfer of Care (Signed)
 Immediate Anesthesia Transfer of Care Note  Patient: Charles Stanton  Procedure(s) Performed: PHACOEMULSIFICATION, CATARACT, WITH IOL INSERTION 5.40, 00:35.4 (Left)  Patient Location: PACU  Anesthesia Type: MAC  Level of Consciousness: awake, alert  and patient cooperative  Airway and Oxygen Therapy: Patient Spontanous Breathing and Patient connected to supplemental oxygen  Post-op Assessment: Post-op Vital signs reviewed, Patient's Cardiovascular Status Stable, Respiratory Function Stable, Patent Airway and No signs of Nausea or vomiting  Post-op Vital Signs: Reviewed and stable  Complications: No notable events documented.

## 2023-08-16 NOTE — Anesthesia Postprocedure Evaluation (Signed)
 Anesthesia Post Note  Patient: Charles Stanton  Procedure(s) Performed: PHACOEMULSIFICATION, CATARACT, WITH IOL INSERTION 5.40, 00:35.4 (Left)  Patient location during evaluation: PACU Anesthesia Type: MAC Level of consciousness: awake and alert Pain management: pain level controlled Vital Signs Assessment: post-procedure vital signs reviewed and stable Respiratory status: spontaneous breathing, nonlabored ventilation, respiratory function stable and patient connected to nasal cannula oxygen Cardiovascular status: stable and blood pressure returned to baseline Postop Assessment: no apparent nausea or vomiting Anesthetic complications: no   No notable events documented.   Last Vitals:  Vitals:   08/16/23 0815 08/16/23 0820  BP: 129/70 125/70  Pulse: (!) 104 (!) 37  Resp: 18 (!) 22  Temp: 36.5 C 36.5 C  SpO2: 95% 95%    Last Pain:  Vitals:   08/16/23 0820  TempSrc:   PainSc: 0-No pain                 Etna Forquer C Fardowsa Authier

## 2023-08-16 NOTE — Op Note (Signed)
 OPERATIVE NOTE  Charles Stanton [id number] 08/16/2023   PREOPERATIVE DIAGNOSIS:  Nuclear sclerotic cataract left eye. H25.12   POSTOPERATIVE DIAGNOSIS:    Nuclear sclerotic cataract left eye.     PROCEDURE:  Phacoemusification with posterior chamber intraocular lens placement of the left eye  Ultrasound time: Procedure(s): PHACOEMULSIFICATION, CATARACT, WITH IOL INSERTION 5.40, 00:35.4 (Left)  LENS:   Implant Name Type Inv. Item Serial No. Manufacturer Lot No. LRB No. Used Action  LENS IOL TECNIS EYHANCE 14.5 - [id number] Intraocular Lens LENS IOL TECNIS EYHANCE 14.5 [id number] [employer]  Left 1 Implanted      SURGEON:  Dene FABIENE Etienne, MD   ANESTHESIA:  Topical with tetracaine  drops and 2% Xylocaine  jelly, augmented with 1% preservative-free intracameral lidocaine .    COMPLICATIONS:  None.   DESCRIPTION OF PROCEDURE:  The patient was identified in the holding room and transported to the operating room and placed in the supine position under the operating microscope.  The left eye was identified as the operative eye and it was prepped and draped in the usual sterile ophthalmic fashion.   A 1 millimeter clear-corneal paracentesis was made at the 1:30 position.  0.5 ml of preservative-free 1% lidocaine  was injected into the anterior chamber.  The anterior chamber was filled with Viscoat viscoelastic.  A 2.4 millimeter keratome was used to make a near-clear corneal incision at the 10:30 position.  .  A curvilinear capsulorrhexis was made with a cystotome and capsulorrhexis forceps.  Balanced salt solution was used to hydrodissect and hydrodelineate the nucleus.   Phacoemulsification was then used in stop and chop fashion to remove the lens nucleus and epinucleus.  The remaining cortex was then removed using the irrigation and aspiration handpiece. Provisc was then placed into the capsular bag to distend it for lens placement.  A lens was then injected into the capsular bag.  The  remaining viscoelastic was aspirated.   Wounds were hydrated with balanced salt solution.  The anterior chamber was inflated to a physiologic pressure with balanced salt solution.  No wound leaks were noted. Cefuroxime  0.1 ml of a 10mg /ml solution was injected into the anterior chamber for a dose of 1 mg of intracameral antibiotic at the completion of the case.   Timolol  and Brimonidine  drops were applied to the eye.  The patient was taken to the recovery room in stable condition without complications of anesthesia or surgery.  [name] 08/16/2023, 8:13 AM

## 2023-08-17 ENCOUNTER — Encounter: Payer: Self-pay | Admitting: Ophthalmology

## 2023-11-07 ENCOUNTER — Other Ambulatory Visit: Payer: Self-pay | Admitting: Gastroenterology

## 2023-11-07 DIAGNOSIS — R748 Abnormal levels of other serum enzymes: Secondary | ICD-10-CM

## 2023-11-07 DIAGNOSIS — D509 Iron deficiency anemia, unspecified: Secondary | ICD-10-CM

## 2023-11-07 DIAGNOSIS — R109 Unspecified abdominal pain: Secondary | ICD-10-CM

## 2023-11-08 ENCOUNTER — Ambulatory Visit: Admitting: Urology

## 2023-11-14 ENCOUNTER — Ambulatory Visit
Admission: RE | Admit: 2023-11-14 | Discharge: 2023-11-14 | Disposition: A | Discharge status: Home / Self Care | Source: Ambulatory Visit | Attending: Gastroenterology | Admitting: Gastroenterology

## 2023-11-14 DIAGNOSIS — R748 Abnormal levels of other serum enzymes: Secondary | ICD-10-CM | POA: Diagnosis present

## 2023-11-14 DIAGNOSIS — R109 Unspecified abdominal pain: Secondary | ICD-10-CM | POA: Diagnosis present

## 2023-11-14 DIAGNOSIS — D509 Iron deficiency anemia, unspecified: Secondary | ICD-10-CM | POA: Diagnosis present

## 2023-11-14 MED ORDER — IOHEXOL 300 MG/ML  SOLN
100.0000 mL | Freq: Once | INTRAMUSCULAR | Status: AC | PRN
  Administered 2023-11-14: 100 mL via INTRAVENOUS

## 2023-11-27 ENCOUNTER — Ambulatory Visit (INDEPENDENT_AMBULATORY_CARE_PROVIDER_SITE_OTHER): Admitting: Urology

## 2023-11-27 ENCOUNTER — Encounter: Payer: Self-pay | Admitting: Urology

## 2023-11-27 VITALS — BP 171/106 | HR 98 | Ht 66.0 in | Wt 135.0 lb

## 2023-11-27 DIAGNOSIS — N3941 Urge incontinence: Secondary | ICD-10-CM | POA: Diagnosis not present

## 2023-11-27 DIAGNOSIS — R399 Unspecified symptoms and signs involving the genitourinary system: Secondary | ICD-10-CM

## 2023-11-27 LAB — BLADDER SCAN AMB NON-IMAGING

## 2023-11-27 MED ORDER — SILODOSIN 8 MG PO CAPS: 8.0000 mg | ORAL_CAPSULE | Freq: Every day | ORAL | 1 refills | Status: AC

## 2023-11-27 NOTE — Patient Instructions (Signed)
 Call back in month

## 2023-11-27 NOTE — Progress Notes (Unsigned)
 11/27/2023 6:05 PM   [name] [birth year] [id number]  Referring provider: Sadie Manna, MD [address] [facility] [place],  [place] [address]  Chief Complaint  Patient presents with   Benign Prostatic Hypertrophy    HPI: Charles Stanton is a 68 y.o. male referred for evaluation of lower urinary tract symptoms.  His wife was with him today who contributed to the history.  2-year history of worsening lower urinary tract symptoms including urinary frequency, urgency with urge incontinence.  He also notes a weak urinary stream. IPSS today 18/35 with his most bothersome symptoms being a weak and intermittent urinary stream He does wear depends for urge incontinence No previous evaluation or treatment for his symptoms Followed by neurology for Parkinson's Denies dysuria, gross hematuria or recurrent UTI No flank, abdominal or pelvic pain PSA 01/23/2023 was 2.33   PMH: Past Medical History:  Diagnosis Date   Abnormal liver function    Anemia    Anxiety    Atrial fibrillation [facility])    sees Dr Carnella   Cancer [facility]) 2017   skin   Colonic mass    Coronary artery disease    A-Fib   Depression    Dizziness    Dry cough    History of colon polyps    Hypercholesteremia    Hyperlipemia    Hypertension    Neuromuscular disorder (HCC)    pt has parkinson's   Palpitations    Parkinson's disease (HCC)    Parkinson's disease (HCC)    Parkinson's disease with dyskinesia without fluctuating manifestations (HCC)    PVD (peripheral vascular disease)    Tonsillitis 03/25/2013   Tremors of nervous system     Surgical History: Past Surgical History:  Procedure Laterality Date   BACK SURGERY     Kyphoplasty   March 2018   CATARACT EXTRACTION W/PHACO Right 08/02/2023   Procedure: PHACOEMULSIFICATION, CATARACT, WITH IOL INSERTION 6.97, 00.50.6;  Surgeon: Mittie Gaskin, MD;  Location: [facility];  Service: Ophthalmology;  Laterality: Right;    CATARACT EXTRACTION W/PHACO Left 08/16/2023   Procedure: PHACOEMULSIFICATION, CATARACT, WITH IOL INSERTION 5.40, 00:35.4;  Surgeon: Mittie Gaskin, MD;  Location: [facility];  Service: Ophthalmology;  Laterality: Left;   COLONOSCOPY     COLONOSCOPY WITH PROPOFOL  N/A 12/12/2017   Procedure: COLONOSCOPY WITH PROPOFOL ;  Surgeon: Therisa Bi, MD;  Location: [facility] ENDOSCOPY;  Service: Gastroenterology;  Laterality: N/A;   ESOPHAGOGASTRODUODENOSCOPY N/A 12/11/2017   Procedure: ESOPHAGOGASTRODUODENOSCOPY (EGD);  Surgeon: Therisa Bi, MD;  Location: [facility];  Service: Gastroenterology;  Laterality: N/A;   ESOPHAGOGASTRODUODENOSCOPY N/A 10/08/2021   Procedure: ESOPHAGOGASTRODUODENOSCOPY (EGD);  Surgeon: Maryruth Ole DASEN, MD;  Location: [facility] ENDOSCOPY;  Service: Endoscopy;  Laterality: N/A;   ESOPHAGOGASTRODUODENOSCOPY (EGD) WITH PROPOFOL  N/A 09/21/2016   Procedure: ESOPHAGOGASTRODUODENOSCOPY (EGD) WITH PROPOFOL ;  Surgeon: Therisa Bi, MD;  Location: [facility] ENDOSCOPY;  Service: Gastroenterology;  Laterality: N/A;   ESOPHAGOGASTRODUODENOSCOPY (EGD) WITH PROPOFOL  N/A 11/07/2016   Procedure: ESOPHAGOGASTRODUODENOSCOPY (EGD) WITH PROPOFOL ;  Surgeon: Therisa Bi, MD;  Location: [facility] ENDOSCOPY;  Service: Gastroenterology;  Laterality: N/A;   ESOPHAGOGASTRODUODENOSCOPY (EGD) WITH PROPOFOL  N/A 10/15/2019   Procedure: ESOPHAGOGASTRODUODENOSCOPY (EGD) WITH PROPOFOL ;  Surgeon: Maryruth Ole DASEN, MD;  Location: [facility] ENDOSCOPY;  Service: Endoscopy;  Laterality: N/A;   ESOPHAGOGASTRODUODENOSCOPY (EGD) WITH PROPOFOL  N/A 10/28/2019   Procedure: ESOPHAGOGASTRODUODENOSCOPY (EGD) WITH PROPOFOL ;  Surgeon: Maryruth Ole DASEN, MD;  Location: [facility] ENDOSCOPY;  Service: Endoscopy;  Laterality: N/A;   ESOPHAGOGASTRODUODENOSCOPY (EGD) WITH PROPOFOL  N/A 12/02/2019  Procedure: ESOPHAGOGASTRODUODENOSCOPY (EGD) WITH PROPOFOL ;  Surgeon: Maryruth Ole DASEN, MD;  Location: [facility] ENDOSCOPY;  Service: Endoscopy;   Laterality: N/A;   ESOPHAGOGASTRODUODENOSCOPY (EGD) WITH PROPOFOL  N/A 02/18/2020   Procedure: ESOPHAGOGASTRODUODENOSCOPY (EGD) WITH PROPOFOL ;  Surgeon: Maryruth Ole DASEN, MD;  Location: [facility] ENDOSCOPY;  Service: Endoscopy;  Laterality: N/A;   ESOPHAGOGASTRODUODENOSCOPY (EGD) WITH PROPOFOL  N/A 08/02/2021   Procedure: ESOPHAGOGASTRODUODENOSCOPY (EGD) WITH PROPOFOL ;  Surgeon: Maryruth Ole DASEN, MD;  Location: [facility] ENDOSCOPY;  Service: Endoscopy;  Laterality: N/A;  REQUEST AM   ESOPHAGOGASTRODUODENOSCOPY (EGD) WITH PROPOFOL  N/A 10/05/2021   Procedure: ESOPHAGOGASTRODUODENOSCOPY (EGD) WITH PROPOFOL ;  Surgeon: Maryruth Ole DASEN, MD;  Location: [facility] ENDOSCOPY;  Service: Endoscopy;  Laterality: N/A;   FRACTURE SURGERY Right 05/28/2019   distal radius fracture   HERNIA REPAIR     left inguinal hernia repair as an infant   KYPHOPLASTY N/A 02/11/2016   Procedure: KYPHOPLASTY  L2,T9;  Surgeon: Ozell Flake, MD;  Location: [facility];  Service: Orthopedics;  Laterality: N/A;   KYPHOPLASTY N/A 03/15/2016   Procedure: KYPHOPLASTY L3;  Surgeon: Ozell Flake, MD;  Location: [facility];  Service: Orthopedics;  Laterality: N/A;   OPEN REDUCTION INTERNAL FIXATION (ORIF) DISTAL RADIAL FRACTURE Right 05/28/2019   Procedure: OPEN REDUCTION INTERNAL FIXATION (ORIF) DISTAL RADIAL FRACTURE;  Surgeon: Flake Ozell, MD;  Location: [facility];  Service: Orthopedics;  Laterality: Right;   skin cancer removed     TONSILLECTOMY      Home Medications:  Allergies as of 11/27/2023       Reactions   Amantadine Other (See Comments)   fatigue sleepy Other reaction(s): Other (See Comments) fatigue sleepy fatigue sleepy fatigue sleepy        Medication List        Accurate as of November 27, 2023  6:05 PM. If you have any questions, ask your nurse or doctor.          Rytary  23.75-95 MG Cpcr Generic drug: Carbidopa -Levodopa  ER Take 1 capsule by mouth daily.   carbidopa -levodopa  25-100 MG tablet Commonly known as:  SINEMET  IR TK 1 T PO four times a day   ELIQUIS PO Take by mouth.   ferrous sulfate 325 (65 FE) MG EC tablet Take 325 mg by mouth 1 day or 1 dose.   pantoprazole  40 MG tablet Commonly known as: PROTONIX  Take 1 tablet (40 mg total) by mouth 2 (two) times daily.   silodosin  8 MG Caps capsule Commonly known as: RAPAFLO  Take 1 capsule (8 mg total) by mouth daily with breakfast. Started by: Glendia JAYSON Barba   vitamin B-12 100 MCG tablet Commonly known as: CYANOCOBALAMIN  Take 100 mcg by mouth daily.   vitamin D3 25 MCG tablet Commonly known as: CHOLECALCIFEROL  Take 1 tablet (1,000 Units total) by mouth daily.        Allergies:  Allergies  Allergen Reactions   Amantadine Other (See Comments)    fatigue sleepy Other reaction(s): Other (See Comments) fatigue sleepy fatigue sleepy fatigue sleepy    Family History: Family History  Problem Relation Age of Onset   Heart Problems Mother    Dementia Mother     Social History:  reports that he has never smoked. He has never used smokeless tobacco. He reports current alcohol use of about 2.0 standard drinks of alcohol per week. He reports that he does not use drugs.   Physical Exam: BP (!) 171/106   Pulse 98   Ht 5' 6 (1.676 m)   Wt 135 lb (61.2 kg)  BMI 21.79 kg/m   Constitutional:  Alert, No acute distress. HEENT: [place] AT Respiratory: Normal respiratory effort, no increased work of breathing. Psychiatric: Normal mood and affect.   Assessment & Plan:    1.  Lower urinary tract symptoms We discussed BPH and Parkinson's as potential multifactorial etiologies of his voiding symptoms PVR today 22 mL Since he does have bothersome obstructive voiding symptoms will initially treat with silodosin  8 mg daily x 30 days If no improvement in his symptoms we will add a beta 3 agonist  2.  Urge incontinence As above  Glendia JAYSON Barba, MD  [facility] [address] [place], [place]  [address] [phone]

## 2023-11-28 ENCOUNTER — Encounter: Payer: Self-pay | Admitting: Urology
